# Patient Record
Sex: Female | Born: 1945 | Race: White | Hispanic: No | Marital: Married | Smoking: Former smoker
Health system: Southern US, Community
[De-identification: ages and names within clinical notes are randomized; demographics above are authoritative.]

## PROBLEM LIST (undated history)

## (undated) DIAGNOSIS — F329 Major depressive disorder, single episode, unspecified: Secondary | ICD-10-CM

## (undated) DIAGNOSIS — E7800 Pure hypercholesterolemia, unspecified: Secondary | ICD-10-CM

## (undated) DIAGNOSIS — M1990 Unspecified osteoarthritis, unspecified site: Secondary | ICD-10-CM

## (undated) DIAGNOSIS — I10 Essential (primary) hypertension: Secondary | ICD-10-CM

## (undated) DIAGNOSIS — I639 Cerebral infarction, unspecified: Secondary | ICD-10-CM

## (undated) DIAGNOSIS — H35041 Retinal micro-aneurysms, unspecified, right eye: Secondary | ICD-10-CM

## (undated) DIAGNOSIS — F32A Depression, unspecified: Secondary | ICD-10-CM

## (undated) DIAGNOSIS — D649 Anemia, unspecified: Secondary | ICD-10-CM

## (undated) HISTORY — PX: TUBAL LIGATION: SHX77

## (undated) HISTORY — PX: FINGER SURGERY: SHX640

## (undated) HISTORY — DX: Depression, unspecified: F32.A

## (undated) HISTORY — PX: EYE SURGERY: SHX253

## (undated) HISTORY — DX: Major depressive disorder, single episode, unspecified: F32.9

---

## 1999-01-09 ENCOUNTER — Encounter: Payer: Self-pay | Admitting: Ophthalmology

## 1999-01-09 ENCOUNTER — Ambulatory Visit (HOSPITAL_COMMUNITY): Admission: RE | Admit: 1999-01-09 | Discharge: 1999-01-10 | Payer: Self-pay | Admitting: Ophthalmology

## 2001-03-12 ENCOUNTER — Encounter: Payer: Self-pay | Admitting: Ophthalmology

## 2001-03-12 ENCOUNTER — Ambulatory Visit (HOSPITAL_COMMUNITY): Admission: RE | Admit: 2001-03-12 | Discharge: 2001-03-13 | Payer: Self-pay | Admitting: Ophthalmology

## 2001-05-27 ENCOUNTER — Other Ambulatory Visit: Admission: RE | Admit: 2001-05-27 | Discharge: 2001-05-27 | Payer: Self-pay | Admitting: Family Medicine

## 2004-04-16 ENCOUNTER — Other Ambulatory Visit: Admission: RE | Admit: 2004-04-16 | Discharge: 2004-04-16 | Payer: Self-pay | Admitting: Family Medicine

## 2008-07-22 ENCOUNTER — Encounter: Admission: RE | Admit: 2008-07-22 | Discharge: 2008-07-22 | Payer: Self-pay | Admitting: Orthopedic Surgery

## 2008-10-19 ENCOUNTER — Encounter: Admission: RE | Admit: 2008-10-19 | Discharge: 2008-12-07 | Payer: Self-pay | Admitting: Neurology

## 2011-02-22 NOTE — Op Note (Signed)
[place]  Patient:    Mckenzie [name], Mckenzie [name]                    MRN: [id number] Proc. Date: 03/12/01 Adm. Date:  [id number] Disc. Date: [id number] Attending:  Bertrum Sol                           Operative Report  DATE OF BIRTH:  [birth year]  ADMISSION DIAGNOSIS:  Macular hole, left eye.  OPERATION:  Pars plana vitrectomy, membrane peel, retinal photocoagulation, serum patch and perfluroropropane injection in the left eye.  SURGEON:  Beulah Gandy. Ashley Royalty, M.D.  ANESTHESIA:  General.  DESCRIPTION OF PROCEDURE:  Usual prep and drape.  Peritomies at 10, 2 and 4 oclock.  The 4 mm angled infusion port was anchored into place at 4 oclock. The lighted pick and the cutter were placed at 10 and 2 oclock, respectively.  The contact lens ring was anchored into place at 6 and 12 oclock.  The pars plana vitrectomy was begun just behind the pseudophakos.  Vitreous strands and membranes were encountered.  These were carefully removed under low suctioning and rapid cutting.  Once the core vitrectomy was complete, the vitrectomy was extended into the far periphery and the vitreous was removed down to the vitreous base.  The silicon-tipped suction line was positioned in the eye and extrusion was used.  The fish strike sign occurred.  Posterior membranes were engaged with the suction device and the lighted pick.  The pick was used to peel posterior membranes from the macular region.  Once this was accomplished, additional vitrectomy cutting was performed.  The instruments were removed from the eye and the indirect ophthalmoscope laser was moved into place.  711 burns were placed with a power between 400 and 460 milliwatts, duration 0.07 to 0.1 seconds and 1000 microns each.  A gas-fluid exchange was carried out removing all fluid from the posterior segment of the eye.  Sufficient time was allowed for additional fluid to track down the walls of the eye.   During this time, the serum patch and perfluoropropane mixture were prepared.  The additional fluid was removed with a [place] ophthalmic brush.  A serum patch was delivered and the intraocular gas was exchanged for perfluoropropane 17%.  The instruments were removed from the eye and 9-0 nylon was used to close the sclerotomy sites.  The closing tension was 10 with a Barraquer tonometer. The conjunctiva was closed with wet field cautery.  Polymyxin and gentamicin were irrigated into [place] space.  Atropine solution was applied.  Decadron 10 mg was injected into the lower subconjunctival space.  Marcaine was injected around the globe for postoperative pain.  Polysporin, a patch and shield were placed.  The patient was awakened and taken to recovery in satisfactory condition. DD:  03/12/01 TD:  03/13/01 Job: [id number]

## 2011-07-11 ENCOUNTER — Encounter (INDEPENDENT_AMBULATORY_CARE_PROVIDER_SITE_OTHER): Payer: Medicare Other | Admitting: Ophthalmology

## 2011-07-11 DIAGNOSIS — H4310 Vitreous hemorrhage, unspecified eye: Secondary | ICD-10-CM

## 2011-07-11 DIAGNOSIS — H35349 Macular cyst, hole, or pseudohole, unspecified eye: Secondary | ICD-10-CM

## 2011-07-24 ENCOUNTER — Encounter (INDEPENDENT_AMBULATORY_CARE_PROVIDER_SITE_OTHER): Payer: Medicare Other | Admitting: Ophthalmology

## 2011-07-24 DIAGNOSIS — H35349 Macular cyst, hole, or pseudohole, unspecified eye: Secondary | ICD-10-CM

## 2011-07-24 DIAGNOSIS — H3530 Unspecified macular degeneration: Secondary | ICD-10-CM

## 2011-07-24 DIAGNOSIS — H3509 Other intraretinal microvascular abnormalities: Secondary | ICD-10-CM

## 2011-08-07 ENCOUNTER — Encounter (INDEPENDENT_AMBULATORY_CARE_PROVIDER_SITE_OTHER): Payer: Self-pay | Admitting: Ophthalmology

## 2011-08-14 ENCOUNTER — Encounter (INDEPENDENT_AMBULATORY_CARE_PROVIDER_SITE_OTHER): Payer: Medicare Other | Admitting: Ophthalmology

## 2011-08-14 DIAGNOSIS — H35039 Hypertensive retinopathy, unspecified eye: Secondary | ICD-10-CM

## 2011-08-14 DIAGNOSIS — H348392 Tributary (branch) retinal vein occlusion, unspecified eye, stable: Secondary | ICD-10-CM

## 2011-08-14 DIAGNOSIS — H35349 Macular cyst, hole, or pseudohole, unspecified eye: Secondary | ICD-10-CM

## 2011-08-14 DIAGNOSIS — H4310 Vitreous hemorrhage, unspecified eye: Secondary | ICD-10-CM

## 2011-10-14 ENCOUNTER — Encounter (INDEPENDENT_AMBULATORY_CARE_PROVIDER_SITE_OTHER): Payer: Medicare Other | Admitting: Ophthalmology

## 2011-10-14 DIAGNOSIS — H35039 Hypertensive retinopathy, unspecified eye: Secondary | ICD-10-CM

## 2011-10-14 DIAGNOSIS — I10 Essential (primary) hypertension: Secondary | ICD-10-CM

## 2011-10-14 DIAGNOSIS — H4310 Vitreous hemorrhage, unspecified eye: Secondary | ICD-10-CM

## 2011-10-14 DIAGNOSIS — H348392 Tributary (branch) retinal vein occlusion, unspecified eye, stable: Secondary | ICD-10-CM

## 2011-10-19 ENCOUNTER — Emergency Department (INDEPENDENT_AMBULATORY_CARE_PROVIDER_SITE_OTHER)
Admission: EM | Admit: 2011-10-19 | Discharge: 2011-10-19 | Disposition: A | Discharge status: Home / Self Care | Payer: Medicare Other | Source: Home / Self Care | Attending: Family Medicine | Admitting: Family Medicine

## 2011-10-19 ENCOUNTER — Encounter (HOSPITAL_COMMUNITY): Payer: Self-pay | Admitting: *Deleted

## 2011-10-19 DIAGNOSIS — J111 Influenza due to unidentified influenza virus with other respiratory manifestations: Secondary | ICD-10-CM

## 2011-10-19 DIAGNOSIS — R6889 Other general symptoms and signs: Secondary | ICD-10-CM

## 2011-10-19 HISTORY — DX: Pure hypercholesterolemia, unspecified: E78.00

## 2011-10-19 HISTORY — DX: Essential (primary) hypertension: I10

## 2011-10-19 HISTORY — DX: Retinal micro-aneurysms, unspecified, right eye: H35.041

## 2011-10-19 MED ORDER — IPRATROPIUM BROMIDE 0.06 % NA SOLN: 2.0000 | Freq: Four times a day (QID) | NASAL | Status: DC

## 2011-10-19 MED ORDER — GUAIFENESIN-CODEINE 100-10 MG/5ML PO SYRP: 5.0000 mL | ORAL_SOLUTION | Freq: Three times a day (TID) | ORAL | Status: AC | PRN

## 2011-10-19 MED ORDER — AZITHROMYCIN 250 MG PO TABS: ORAL_TABLET | ORAL | Status: AC

## 2011-10-19 NOTE — ED Notes (Signed)
Pt with c/o cough /congestion/headache/mild bilateral ear pain /facial pressure/ fever onset yesterday

## 2011-10-19 NOTE — ED Provider Notes (Signed)
History     CSN: [id number]  Arrival date & time 10/19/11  4098   First MD Initiated Contact with Patient 10/19/11 [phone]      Chief Complaint  Patient presents with  . Cough  . Nasal Congestion  . Fever  . Headache    (Consider location/radiation/quality/duration/timing/severity/associated sxs/prior treatment) Patient is a 66 y.o. female presenting with cough, fever, and headaches. The history is provided by the patient.  Cough This is a new problem. The current episode started yesterday. The problem occurs constantly. The problem has not changed since onset.The cough is non-productive. The maximum temperature recorded prior to her arrival was 101 to 101.9 F. Associated symptoms include headaches and myalgias. Pertinent negatives include no chills and no rhinorrhea. She is not a smoker.  Fever Primary symptoms of the febrile illness include fever, headaches, cough and myalgias.  Headache The primary symptoms include headaches and fever.    Past Medical History  Diagnosis Date  . Hypertension   . High cholesterol   . Retinal micro-aneurysm of right eye     Past Surgical History  Procedure Date  . Eye surgery     History reviewed. No pertinent family history.  History  Substance Use Topics  . Smoking status: Never Smoker   . Smokeless tobacco: Not on file  . Alcohol Use: No    OB History    Grav Para Term Preterm Abortions TAB SAB Ect Mult Living                  Review of Systems  Constitutional: Positive for fever. Negative for chills.  HENT: Positive for congestion and postnasal drip. Negative for rhinorrhea.   Respiratory: Positive for cough.   Cardiovascular: Negative.   Gastrointestinal: Negative.   Genitourinary: Negative.   Musculoskeletal: Positive for myalgias.  Neurological: Positive for headaches.    Allergies  Review of patient's allergies indicates no known allergies.  Home Medications   Current Outpatient Rx  Name Route Sig Dispense  Refill  . ATORVASTATIN CALCIUM 40 MG PO TABS Oral Take 40 mg by mouth daily.    Marland Kitchen BRIMONIDINE TARTRATE 0.1 % OP SOLN  1 drop. One drop right eye twice a day    . LISINOPRIL 20 MG PO TABS Oral Take 20 mg by mouth daily.    Marland Kitchen LOTEPREDNOL ETABONATE 0.5 % OP SUSP Right Eye Place 1 drop into the right eye 4 (four) times daily.    Marland Kitchen PAROXETINE HCL 20 MG PO TABS Oral Take 20 mg by mouth every morning.    . AZITHROMYCIN 250 MG PO TABS  Take as directed on pack 6 each 0  . GUAIFENESIN-CODEINE 100-10 MG/5ML PO SYRP Oral Take 5 mLs by mouth 3 (three) times daily as needed for cough. 120 mL 0  . IPRATROPIUM BROMIDE 0.06 % NA SOLN Nasal Place 2 sprays into the nose 4 (four) times daily. 15 mL 12    BP 140/77  Pulse 96  Temp(Src) 101.3 F (38.5 C) (Oral)  Resp 19  SpO2 95%  Physical Exam  Nursing note and vitals reviewed. Constitutional: She is oriented to person, place, and time. She appears well-developed and well-nourished.  HENT:  Head: Normocephalic.  Right Ear: External ear normal.  Left Ear: External ear normal.  Nose: Nose normal.  Mouth/Throat: Oropharynx is clear and moist.  Eyes: Conjunctivae and EOM are normal. Pupils are equal, round, and reactive to light.  Neck: Normal range of motion. Neck supple.  Cardiovascular: Normal rate, regular  rhythm, normal heart sounds and intact distal pulses.   Pulmonary/Chest: Effort normal and breath sounds normal. She has no wheezes. She has no rales.  Abdominal: Soft. Bowel sounds are normal.  Lymphadenopathy:    She has no cervical adenopathy.  Neurological: She is alert and oriented to person, place, and time.  Skin: Skin is warm and dry.  Psychiatric: She has a normal mood and affect.    ED Course  Procedures (including critical care time)  Labs Reviewed - No data to display No results found.   1. Influenza-like illness       MDM          Barkley Bruns, MD 10/19/11 1016

## 2011-10-31 ENCOUNTER — Encounter (INDEPENDENT_AMBULATORY_CARE_PROVIDER_SITE_OTHER): Payer: Medicare Other | Admitting: Ophthalmology

## 2011-10-31 DIAGNOSIS — I10 Essential (primary) hypertension: Secondary | ICD-10-CM

## 2011-10-31 DIAGNOSIS — H35039 Hypertensive retinopathy, unspecified eye: Secondary | ICD-10-CM

## 2011-10-31 DIAGNOSIS — H348392 Tributary (branch) retinal vein occlusion, unspecified eye, stable: Secondary | ICD-10-CM

## 2012-01-11 ENCOUNTER — Encounter (HOSPITAL_COMMUNITY): Payer: Self-pay | Admitting: Nurse Practitioner

## 2012-01-11 ENCOUNTER — Emergency Department (HOSPITAL_COMMUNITY): Payer: Medicare Other

## 2012-01-11 ENCOUNTER — Emergency Department (HOSPITAL_COMMUNITY)
Admission: EM | Admit: 2012-01-11 | Discharge: 2012-01-11 | Disposition: A | Discharge status: Home Health | Payer: Medicare Other | Attending: Emergency Medicine | Admitting: Emergency Medicine

## 2012-01-11 DIAGNOSIS — M25473 Effusion, unspecified ankle: Secondary | ICD-10-CM | POA: Insufficient documentation

## 2012-01-11 DIAGNOSIS — X500XXA Overexertion from strenuous movement or load, initial encounter: Secondary | ICD-10-CM | POA: Insufficient documentation

## 2012-01-11 DIAGNOSIS — S9000XA Contusion of unspecified ankle, initial encounter: Secondary | ICD-10-CM | POA: Insufficient documentation

## 2012-01-11 DIAGNOSIS — E7800 Pure hypercholesterolemia, unspecified: Secondary | ICD-10-CM | POA: Insufficient documentation

## 2012-01-11 DIAGNOSIS — S9030XA Contusion of unspecified foot, initial encounter: Secondary | ICD-10-CM | POA: Insufficient documentation

## 2012-01-11 DIAGNOSIS — M25579 Pain in unspecified ankle and joints of unspecified foot: Secondary | ICD-10-CM | POA: Insufficient documentation

## 2012-01-11 DIAGNOSIS — I10 Essential (primary) hypertension: Secondary | ICD-10-CM | POA: Insufficient documentation

## 2012-01-11 DIAGNOSIS — M25476 Effusion, unspecified foot: Secondary | ICD-10-CM | POA: Insufficient documentation

## 2012-01-11 DIAGNOSIS — M7989 Other specified soft tissue disorders: Secondary | ICD-10-CM | POA: Insufficient documentation

## 2012-01-11 DIAGNOSIS — Z79899 Other long term (current) drug therapy: Secondary | ICD-10-CM | POA: Insufficient documentation

## 2012-01-11 DIAGNOSIS — Y92009 Unspecified place in unspecified non-institutional (private) residence as the place of occurrence of the external cause: Secondary | ICD-10-CM | POA: Insufficient documentation

## 2012-01-11 DIAGNOSIS — M79609 Pain in unspecified limb: Secondary | ICD-10-CM | POA: Insufficient documentation

## 2012-01-11 DIAGNOSIS — S93409A Sprain of unspecified ligament of unspecified ankle, initial encounter: Secondary | ICD-10-CM

## 2012-01-11 MED ORDER — HYDROCODONE-ACETAMINOPHEN 5-325 MG PO TABS: 1.0000 | ORAL_TABLET | ORAL | Status: AC | PRN

## 2012-01-11 MED ORDER — HYDROCODONE-ACETAMINOPHEN 5-325 MG PO TABS
1.0000 | ORAL_TABLET | Freq: Once | ORAL | Status: AC
  Administered 2012-01-11: 1 via ORAL
  Filled 2012-01-11: qty 1

## 2012-01-11 NOTE — ED Notes (Signed)
Ortho at bedside to apply splint and complete crutch teaching

## 2012-01-11 NOTE — ED Notes (Signed)
Pt getting up from chair today and twisted R ankle. Pain and swelling since. Cms intact

## 2012-01-11 NOTE — ED Provider Notes (Signed)
History     CSN: [id number]  Arrival date & time 01/11/12  1157   First MD Initiated Contact with Patient 01/11/12 1320      Chief Complaint  Patient presents with  . Ankle Pain    (Consider location/radiation/quality/duration/timing/severity/associated sxs/prior treatment) HPI Comments: Patient was at home today at approximately 10:30 she got up from a chair in her foot twisted abnormally.  She now has significant swelling and bruising present over her right foot and ankle.  She has no focal areas of pain but has diffuse pain across her foot and ankle.  She's been unable to bear weight on it.  Patient denies pain elsewhere.  She did not get dizzy, lightheaded or have a syncopal episode.  She has seen an orthopedic specialist in the past.  No numbness in the foot.  Patient is a 66 y.o. female presenting with ankle pain. The history is provided by the patient. No language interpreter was used.  Ankle Pain  The incident occurred 3 to 5 hours ago. The incident occurred at home.    Past Medical History  Diagnosis Date  . Hypertension   . High cholesterol   . Retinal micro-aneurysm of right eye     Past Surgical History  Procedure Date  . Eye surgery     History reviewed. No pertinent family history.  History  Substance Use Topics  . Smoking status: Never Smoker   . Smokeless tobacco: Not on file  . Alcohol Use: No    OB History    Grav Para Term Preterm Abortions TAB SAB Ect Mult Living                  Review of Systems  Constitutional: Negative.  Negative for fever and chills.  HENT: Negative.   Eyes: Negative.  Negative for discharge and redness.  Respiratory: Negative.  Negative for cough and shortness of breath.   Cardiovascular: Negative.  Negative for chest pain.  Gastrointestinal: Negative.  Negative for nausea, vomiting, abdominal pain and diarrhea.  Genitourinary: Negative.  Negative for dysuria and vaginal discharge.  Musculoskeletal: Positive for joint  swelling. Negative for back pain.  Skin: Negative.  Negative for color change and rash.  Neurological: Negative.  Negative for syncope and headaches.  Hematological: Negative.  Negative for adenopathy.  Psychiatric/Behavioral: Negative.  Negative for confusion.  All other systems reviewed and are negative.    Allergies  Aldomet  Home Medications   Current Outpatient Rx  Name Route Sig Dispense Refill  . ATORVASTATIN CALCIUM 40 MG PO TABS Oral Take 40 mg by mouth daily.    Marland Kitchen BRIMONIDINE TARTRATE 0.1 % OP SOLN Right Eye Place 1 drop into the right eye 2 (two) times daily.     Marland Kitchen CINNAMON PO Oral Take 1 capsule by mouth daily.    Marland Kitchen VITAMIN B-12 PO Oral Take 1 tablet by mouth daily.    Marland Kitchen FLAXSEED OIL PO Oral Take 1 capsule by mouth daily.    Marland Kitchen LISINOPRIL 20 MG PO TABS Oral Take 20 mg by mouth daily.    Marland Kitchen LOTEPREDNOL ETABONATE 0.5 % OP SUSP Right Eye Place 1 drop into the right eye 4 (four) times daily.    Marland Kitchen FISH OIL PO Oral Take 1 capsule by mouth daily.    Marland Kitchen PAROXETINE HCL 20 MG PO TABS Oral Take 20 mg by mouth every morning.      BP 153/72  Pulse 73  Temp(Src) 98.4 F (36.9 C) (Oral)  Resp  14  Ht 5\' 4"  (1.626 m)  Wt 132 lb (59.875 kg)  BMI 22.66 kg/m2  SpO2 99%  Physical Exam  Nursing note and vitals reviewed. Constitutional: She is oriented to person, place, and time. She appears well-developed and well-nourished.  Non-toxic appearance. She does not have a sickly appearance.  HENT:  Head: Normocephalic and atraumatic.  Eyes: Conjunctivae, EOM and lids are normal. Pupils are equal, round, and reactive to light. No scleral icterus.  Neck: Trachea normal and normal range of motion. Neck supple.  Cardiovascular: Normal rate.   Pulmonary/Chest: Effort normal.  Abdominal: Soft. Normal appearance. There is no CVA tenderness.  Musculoskeletal: Normal range of motion.       Patient has significant swelling over her right ankle and right foot.  There is bruising noted on the more  lateral aspect of her foot and ankle.  Capillary refill is less than 2 seconds.  Patient has sensation to light touch on the medial and lateral aspects of her foot.  No tenderness to palpation more proximally on her leg.  Neurological: She is alert and oriented to person, place, and time. She has normal strength.  Skin: Skin is warm, dry and intact. No rash noted.  Psychiatric: She has a normal mood and affect. Her behavior is normal. Judgment and thought content normal.    ED Course  Procedures (including critical care time)  Labs Reviewed - No data to display Dg Ankle 2 Views Right  01/11/2012  *RADIOLOGY REPORT*  Clinical Data: Twisted ankle  RIGHT ANKLE - 2 VIEW  Comparison: None.  Findings: Two views of the right ankle submitted. Diffuse osteopenia.  No displaced fracture or subluxation.  Significant soft tissue swelling noted adjacent to lateral malleolus.  Ankle mortise is preserved.  Plantar and posterior spur of the calcaneus. There is a small bony fragment adjacent to posterior superior aspect of the navicular.  Small avulsion fracture cannot be excluded.  Clinical correlation is necessary.  IMPRESSION: No displaced fracture or subluxation.  Significant soft tissue swelling noted adjacent to lateral malleolus.  Ankle mortise is preserved.  Plantar and posterior spur of the calcaneus.  There is a small bony fragment adjacent to posterior superior aspect of the navicular.  Small avulsion fracture cannot be excluded.  Clinical correlation is necessary.  Original Report Authenticated By: Natasha Mead, M.D.     No diagnosis found.    MDM  Patient with no obvious fracture on her x-ray.  She is significantly swollen and bruised across her foot and ankle swelling going to place her in a stirrup splint and make her nonweightbearing with crutches.  Patient has been given instructions to followup with her orthopedist later this week.  She has seen Dr. Darrelyn Hillock in the past.  Maryclare Labrador place her on pain  medicine as well.  She shows no signs of neurovascular deficits at this time.        Nat Christen, MD 01/11/12 1331

## 2012-01-11 NOTE — Progress Notes (Signed)
Orthopedic Tech Progress Note Patient Details:  Mckenzie [name] 1946-05-27 [id number]  Type of Splint: Short Leg Splint Interventions: Application    Cammer, Mickie Bail 01/11/2012, 2:12 PM

## 2012-01-11 NOTE — Discharge Instructions (Signed)
Ankle Sprain An ankle sprain is an injury to the strong, fibrous tissues (ligaments) that hold the bones of your ankle joint together.  CAUSES Ankle sprain usually is caused by a fall or by twisting your ankle. People who participate in sports are more prone to these types of injuries.  SYMPTOMS  Symptoms of ankle sprain include:  Pain in your ankle. The pain may be present at rest or only when you are trying to stand or walk.   Swelling.   Bruising. Bruising may develop immediately or within 1 to 2 days after your injury.   Difficulty standing or walking.  DIAGNOSIS  Your caregiver will ask you details about your injury and perform a physical exam of your ankle to determine if you have an ankle sprain. During the physical exam, your caregiver will press and squeeze specific areas of your foot and ankle. Your caregiver will try to move your ankle in certain ways. An X-ray exam may be done to be sure a bone was not broken or a ligament did not separate from one of the bones in your ankle (avulsion).  TREATMENT  Certain types of braces can help stabilize your ankle. Your caregiver can make a recommendation for this. Your caregiver may recommend the use of medication for pain. If your sprain is severe, your caregiver may refer you to a surgeon who helps to restore function to parts of your skeletal system (orthopedist) or a physical therapist. HOME CARE INSTRUCTIONS  Apply ice to your injury for 1 to 2 days or as directed by your caregiver. Applying ice helps to reduce inflammation and pain.  Put ice in a plastic bag.   Place a towel between your skin and the bag.   Leave the ice on for 15 to 20 minutes at a time, every 2 hours while you are awake.   Take over-the-counter or prescription medicines for pain, discomfort, or fever only as directed by your caregiver.   Keep your injured leg elevated, when possible, to lessen swelling.   If your caregiver recommends crutches, use them as  instructed. Gradually, put weight on the affected ankle. Continue to use crutches or a cane until you can walk without feeling pain in your ankle.   If you have a plaster splint, wear the splint as directed by your caregiver. Do not rest it on anything harder than a pillow the first 24 hours. Do not put weight on it. Do not get it wet. You may take it off to take a shower or bath.   You may have been given an elastic bandage to wear around your ankle to provide support. If the elastic bandage is too tight (you have numbness or tingling in your foot or your foot becomes cold and blue), adjust the bandage to make it comfortable.   If you have an air splint, you may blow more air into it or let air out to make it more comfortable. You may take your splint off at night and before taking a shower or bath.   Wiggle your toes in the splint several times per day if you are able.  SEEK MEDICAL CARE IF:   You have an increase in bruising, swelling, or pain.   Your toes feel cold.   Pain relief is not achieved with medication.  SEEK IMMEDIATE MEDICAL CARE IF: Your toes are numb or blue or you have severe pain. MAKE SURE YOU:   Understand these instructions.   Will watch your condition.     Will get help right away if you are not doing well or get worse.  Document Released: 09/23/2005 Document Revised: 09/12/2011 Document Reviewed: 04/27/2008 [facility] Patient Information 2012 [place], [place].  Cast or Splint Care Casts and splints support injured limbs and keep bones from moving while they heal. It is important to care for your cast or splint at home.  HOME CARE INSTRUCTIONS  Keep the cast or splint uncovered during the drying period. It can take 24 to 48 hours to dry if it is made of plaster. A fiberglass cast will dry in less than 1 hour.   Do not rest the cast on anything harder than a pillow for the first 24 hours.   Do not put weight on your injured limb or apply pressure to the cast  until your caregiver gives you permission.   Keep the cast or splint dry. Wet casts or splints can lose their shape and may not support the limb as well. Also, wet skin can become infected. Cover the cast or splint with a plastic bag when bathing or when out in the rain or snow. If the cast is on the trunk of the body, take sponge baths until the cast is removed.   Keep your cast or splint clean. Soiled casts may be wiped with a moistened cloth.   Do not place any foreign objects under your cast or splint. Do not try to scratch the skin under the cast with any object. The object could get stuck inside the cast. Also, scratching could lead to an infection.   Do not remove padding from inside your cast.   Exercise all joints next to the injury that are not immobilized by the cast or splint. For example, if you have a long leg cast, exercise the hip joint and toes. If you have an arm cast or splint, exercise the shoulder, elbow, thumb, and fingers.   Elevate your injured arm or leg on 1 or 2 pillows for the first 1 to 3 days to decrease swelling and pain.It is best if you can comfortably elevate your cast so it is higher than your heart.  SEEK MEDICAL CARE IF:   Your cast or splint cracks.   Your cast or splint is too tight or too loose.   You experience unbearable itching inside the cast.   Your cast becomes wet or develops a soft spot or area.   You have a bad smell coming from inside your cast.   You get an object stuck under your cast.   Your skin around the cast becomes red or raw.   You develop a new pain or worsening pain after the cast has been applied.  SEEK IMMEDIATE MEDICAL CARE IF:   You have fluid leaking through the cast.   You are unable to move your fingers or toes.   You have discolored, cool, painful, or very swollen fingers or toes beyond the cast.   You have tingling or numbness around the injured area.   You have severe pain or pressure under the cast.    You develop any difficulty with your breathing or have shortness of breath.   You develop chest pain.  Document Released: 09/20/2000 Document Revised: 09/12/2011 Document Reviewed: 12/19/2010 [facility] Patient Information 2012 [place], [place].

## 2012-01-11 NOTE — ED Notes (Signed)
otho paged for crutches and splint

## 2012-01-11 NOTE — Progress Notes (Signed)
Orthopedic Tech Progress Note Patient Details:  Mckenzie [name] 30-Sep-1946 [id number]  Other Ortho Devices Type of Ortho Device: Crutches Ortho Device Interventions: Adjustment   Cammer, Mickie Bail 01/11/2012, 2:12 PM

## 2012-01-29 ENCOUNTER — Encounter (INDEPENDENT_AMBULATORY_CARE_PROVIDER_SITE_OTHER): Payer: Medicare Other | Admitting: Ophthalmology

## 2012-01-29 DIAGNOSIS — H35039 Hypertensive retinopathy, unspecified eye: Secondary | ICD-10-CM

## 2012-01-29 DIAGNOSIS — H348392 Tributary (branch) retinal vein occlusion, unspecified eye, stable: Secondary | ICD-10-CM

## 2012-01-29 DIAGNOSIS — I10 Essential (primary) hypertension: Secondary | ICD-10-CM

## 2012-01-29 DIAGNOSIS — H43819 Vitreous degeneration, unspecified eye: Secondary | ICD-10-CM

## 2012-03-11 ENCOUNTER — Encounter (INDEPENDENT_AMBULATORY_CARE_PROVIDER_SITE_OTHER): Payer: Medicare Other | Admitting: Ophthalmology

## 2012-03-11 DIAGNOSIS — H35349 Macular cyst, hole, or pseudohole, unspecified eye: Secondary | ICD-10-CM

## 2012-03-11 DIAGNOSIS — H348392 Tributary (branch) retinal vein occlusion, unspecified eye, stable: Secondary | ICD-10-CM

## 2012-03-11 DIAGNOSIS — H35039 Hypertensive retinopathy, unspecified eye: Secondary | ICD-10-CM

## 2012-03-11 DIAGNOSIS — I10 Essential (primary) hypertension: Secondary | ICD-10-CM

## 2012-07-13 ENCOUNTER — Ambulatory Visit (INDEPENDENT_AMBULATORY_CARE_PROVIDER_SITE_OTHER): Payer: Medicare Other | Admitting: Ophthalmology

## 2012-07-13 DIAGNOSIS — H35039 Hypertensive retinopathy, unspecified eye: Secondary | ICD-10-CM

## 2012-07-13 DIAGNOSIS — H348392 Tributary (branch) retinal vein occlusion, unspecified eye, stable: Secondary | ICD-10-CM

## 2012-07-13 DIAGNOSIS — I10 Essential (primary) hypertension: Secondary | ICD-10-CM

## 2012-07-13 DIAGNOSIS — H43819 Vitreous degeneration, unspecified eye: Secondary | ICD-10-CM

## 2012-07-17 ENCOUNTER — Ambulatory Visit (INDEPENDENT_AMBULATORY_CARE_PROVIDER_SITE_OTHER): Payer: Medicare Other | Admitting: Ophthalmology

## 2012-07-17 DIAGNOSIS — H348392 Tributary (branch) retinal vein occlusion, unspecified eye, stable: Secondary | ICD-10-CM

## 2012-07-17 DIAGNOSIS — H3530 Unspecified macular degeneration: Secondary | ICD-10-CM

## 2012-07-17 DIAGNOSIS — I10 Essential (primary) hypertension: Secondary | ICD-10-CM

## 2012-07-17 DIAGNOSIS — H35039 Hypertensive retinopathy, unspecified eye: Secondary | ICD-10-CM

## 2012-08-10 ENCOUNTER — Encounter (INDEPENDENT_AMBULATORY_CARE_PROVIDER_SITE_OTHER): Payer: Medicare Other | Admitting: Ophthalmology

## 2012-08-10 DIAGNOSIS — H35039 Hypertensive retinopathy, unspecified eye: Secondary | ICD-10-CM

## 2012-08-10 DIAGNOSIS — H348392 Tributary (branch) retinal vein occlusion, unspecified eye, stable: Secondary | ICD-10-CM

## 2012-08-10 DIAGNOSIS — I10 Essential (primary) hypertension: Secondary | ICD-10-CM

## 2012-09-10 ENCOUNTER — Encounter (INDEPENDENT_AMBULATORY_CARE_PROVIDER_SITE_OTHER): Payer: Medicare Other | Admitting: Ophthalmology

## 2012-09-10 DIAGNOSIS — H35349 Macular cyst, hole, or pseudohole, unspecified eye: Secondary | ICD-10-CM

## 2012-09-10 DIAGNOSIS — H348392 Tributary (branch) retinal vein occlusion, unspecified eye, stable: Secondary | ICD-10-CM

## 2012-09-10 DIAGNOSIS — H35039 Hypertensive retinopathy, unspecified eye: Secondary | ICD-10-CM

## 2012-09-10 DIAGNOSIS — I10 Essential (primary) hypertension: Secondary | ICD-10-CM

## 2012-10-09 ENCOUNTER — Encounter (INDEPENDENT_AMBULATORY_CARE_PROVIDER_SITE_OTHER): Payer: Medicare Other | Admitting: Ophthalmology

## 2012-10-09 DIAGNOSIS — H348392 Tributary (branch) retinal vein occlusion, unspecified eye, stable: Secondary | ICD-10-CM

## 2012-10-09 DIAGNOSIS — I10 Essential (primary) hypertension: Secondary | ICD-10-CM

## 2012-10-09 DIAGNOSIS — H35039 Hypertensive retinopathy, unspecified eye: Secondary | ICD-10-CM

## 2012-11-06 ENCOUNTER — Encounter (INDEPENDENT_AMBULATORY_CARE_PROVIDER_SITE_OTHER): Payer: Medicare Other | Admitting: Ophthalmology

## 2012-11-06 DIAGNOSIS — H35039 Hypertensive retinopathy, unspecified eye: Secondary | ICD-10-CM

## 2012-11-06 DIAGNOSIS — H348392 Tributary (branch) retinal vein occlusion, unspecified eye, stable: Secondary | ICD-10-CM

## 2012-11-06 DIAGNOSIS — I10 Essential (primary) hypertension: Secondary | ICD-10-CM

## 2012-12-03 ENCOUNTER — Encounter (INDEPENDENT_AMBULATORY_CARE_PROVIDER_SITE_OTHER): Payer: Medicare Other | Admitting: Ophthalmology

## 2012-12-03 DIAGNOSIS — H348392 Tributary (branch) retinal vein occlusion, unspecified eye, stable: Secondary | ICD-10-CM

## 2012-12-03 DIAGNOSIS — H35039 Hypertensive retinopathy, unspecified eye: Secondary | ICD-10-CM

## 2012-12-03 DIAGNOSIS — I10 Essential (primary) hypertension: Secondary | ICD-10-CM

## 2012-12-31 ENCOUNTER — Encounter (INDEPENDENT_AMBULATORY_CARE_PROVIDER_SITE_OTHER): Payer: Medicare Other | Admitting: Ophthalmology

## 2012-12-31 DIAGNOSIS — H35349 Macular cyst, hole, or pseudohole, unspecified eye: Secondary | ICD-10-CM

## 2012-12-31 DIAGNOSIS — I10 Essential (primary) hypertension: Secondary | ICD-10-CM

## 2012-12-31 DIAGNOSIS — H35039 Hypertensive retinopathy, unspecified eye: Secondary | ICD-10-CM

## 2012-12-31 DIAGNOSIS — H348392 Tributary (branch) retinal vein occlusion, unspecified eye, stable: Secondary | ICD-10-CM

## 2013-01-25 ENCOUNTER — Encounter (INDEPENDENT_AMBULATORY_CARE_PROVIDER_SITE_OTHER): Payer: Medicare Other | Admitting: Ophthalmology

## 2013-01-25 DIAGNOSIS — I10 Essential (primary) hypertension: Secondary | ICD-10-CM

## 2013-01-25 DIAGNOSIS — H348392 Tributary (branch) retinal vein occlusion, unspecified eye, stable: Secondary | ICD-10-CM

## 2013-01-25 DIAGNOSIS — H35039 Hypertensive retinopathy, unspecified eye: Secondary | ICD-10-CM

## 2013-01-25 DIAGNOSIS — H35349 Macular cyst, hole, or pseudohole, unspecified eye: Secondary | ICD-10-CM

## 2013-02-22 ENCOUNTER — Encounter (INDEPENDENT_AMBULATORY_CARE_PROVIDER_SITE_OTHER): Payer: Medicare Other | Admitting: Ophthalmology

## 2013-02-22 DIAGNOSIS — H35039 Hypertensive retinopathy, unspecified eye: Secondary | ICD-10-CM

## 2013-02-22 DIAGNOSIS — I10 Essential (primary) hypertension: Secondary | ICD-10-CM

## 2013-02-22 DIAGNOSIS — H348392 Tributary (branch) retinal vein occlusion, unspecified eye, stable: Secondary | ICD-10-CM

## 2013-02-22 DIAGNOSIS — H35379 Puckering of macula, unspecified eye: Secondary | ICD-10-CM

## 2013-02-22 DIAGNOSIS — H43819 Vitreous degeneration, unspecified eye: Secondary | ICD-10-CM

## 2013-03-09 ENCOUNTER — Ambulatory Visit (INDEPENDENT_AMBULATORY_CARE_PROVIDER_SITE_OTHER): Payer: Medicare Other | Admitting: General Practice

## 2013-03-09 ENCOUNTER — Encounter: Payer: Self-pay | Admitting: General Practice

## 2013-03-09 VITALS — BP 137/72 | HR 73 | Temp 98.4°F | Ht 66.0 in | Wt 144.0 lb

## 2013-03-09 DIAGNOSIS — E785 Hyperlipidemia, unspecified: Secondary | ICD-10-CM

## 2013-03-09 DIAGNOSIS — Z09 Encounter for follow-up examination after completed treatment for conditions other than malignant neoplasm: Secondary | ICD-10-CM

## 2013-03-09 DIAGNOSIS — I10 Essential (primary) hypertension: Secondary | ICD-10-CM

## 2013-03-09 LAB — POCT CBC
Hemoglobin: 13.9 g/dL (ref 12.2–16.2)
Lymph, poc: 2 (ref 0.6–3.4)
MCH, POC: 28.9 pg (ref 27–31.2)
MCHC: 33.6 g/dL (ref 31.8–35.4)
MCV: 86 fL (ref 80–97)
MPV: 8 fL (ref 0–99.8)
POC LYMPH PERCENT: 32.2 %L (ref 10–50)

## 2013-03-09 NOTE — Progress Notes (Signed)
  Subjective:    [name], female    DOB: [birth year], 67 y.o.   MRN: [id number]  HPI Presents today for 3 month follow up on chronic medical conditions (hypertension and hyperlipidemia). Reports taking medications as prescribed.     Review of Systems  Constitutional: Negative.  Negative for fever and chills.  Respiratory: Negative for choking and shortness of breath.   Cardiovascular: Negative for chest pain, palpitations and leg swelling.  Gastrointestinal: Negative.   Genitourinary: Negative.   Musculoskeletal: Negative.   Skin: Negative.   Neurological: Negative for dizziness, weakness, numbness and headaches.  Psychiatric/Behavioral: Negative.   All other systems reviewed and are negative.       Objective:   Physical Exam  Constitutional: She is oriented to person, place, and time. She appears well-developed and well-nourished.  HENT:  Head: Normocephalic and atraumatic.  Right Ear: External ear normal.  Left Ear: External ear normal.  Eyes: EOM are normal.  Neck: Normal range of motion.  Cardiovascular: Normal rate, regular rhythm and normal heart sounds.   Pulmonary/Chest: Effort normal and breath sounds normal. No respiratory distress.  Abdominal: Soft. Bowel sounds are normal. She exhibits no distension. There is no tenderness.  Neurological: She is alert and oriented to person, place, and time.  Skin: Skin is warm and dry.  Psychiatric: She has a normal mood and affect.          Assessment & Plan:  1. Other and unspecified hyperlipidemia - POCT CBC - NMR Lipoprofile with Lipids - COMPLETE METABOLIC PANEL WITH GFR - atorvastatin (LIPITOR) 40 MG tablet; Take 1 tablet (40 mg total) by mouth daily.  Dispense: 30 tablet; Refill: 3  2. Essential hypertension, benign - lisinopril (PRINIVIL,ZESTRIL) 20 MG tablet; Take 1 tablet (20 mg total) by mouth daily.  Dispense: 30 tablet; Refill: 3 -Continue to work on healthy eating habits  -discussed  importance of regular exercise -Patient verbalized understanding Coralie Keens, FNP-C

## 2013-03-10 LAB — COMPLETE METABOLIC PANEL WITH GFR
Albumin: 4.2 g/dL (ref 3.5–5.2)
Alkaline Phosphatase: 83 U/L (ref 39–117)
BUN: 10 mg/dL (ref 6–23)
CO2: 30 mEq/L (ref 19–32)
Calcium: 10 mg/dL (ref 8.4–10.5)
Chloride: 104 mEq/L (ref 96–112)
GFR, Est African American: >89 mL/min
GFR, Est Non African American: >89 mL/min
Glucose, Bld: 90 mg/dL (ref 70–99)
Potassium: 4.7 mEq/L (ref 3.5–5.3)
Total Protein: 6.9 g/dL (ref 6.0–8.3)

## 2013-03-10 LAB — NMR LIPOPROFILE WITH LIPIDS
HDL Particle Number: 33.2 umol/L (ref >=30.5)
HDL Size: 8.6 nm — ABNORMAL LOW (ref >=9.2)
Large HDL-P: 3.3 umol/L — ABNORMAL LOW (ref >=4.8)
Large VLDL-P: 1.3 nmol/L (ref <=2.7)
Triglycerides: 208 mg/dL — ABNORMAL HIGH (ref <150)

## 2013-03-10 MED ORDER — ATORVASTATIN CALCIUM 40 MG PO TABS: 40.0000 mg | ORAL_TABLET | Freq: Every day | ORAL | Status: DC

## 2013-03-10 MED ORDER — LISINOPRIL 20 MG PO TABS: 20.0000 mg | ORAL_TABLET | Freq: Every day | ORAL | Status: DC

## 2013-03-15 ENCOUNTER — Telehealth: Payer: Self-pay | Admitting: General Practice

## 2013-03-15 NOTE — Telephone Encounter (Signed)
Spoke with husband

## 2013-03-16 NOTE — Telephone Encounter (Signed)
lmtcb on result page

## 2013-03-19 ENCOUNTER — Telehealth: Payer: Self-pay | Admitting: General Practice

## 2013-03-19 NOTE — Telephone Encounter (Signed)
Spoke with patient.

## 2013-03-22 ENCOUNTER — Encounter (INDEPENDENT_AMBULATORY_CARE_PROVIDER_SITE_OTHER): Payer: Medicare Other | Admitting: Ophthalmology

## 2013-03-22 DIAGNOSIS — H348392 Tributary (branch) retinal vein occlusion, unspecified eye, stable: Secondary | ICD-10-CM

## 2013-03-22 DIAGNOSIS — H35039 Hypertensive retinopathy, unspecified eye: Secondary | ICD-10-CM

## 2013-03-22 DIAGNOSIS — I10 Essential (primary) hypertension: Secondary | ICD-10-CM

## 2013-03-23 ENCOUNTER — Telehealth: Payer: Self-pay | Admitting: General Practice

## 2013-03-23 NOTE — Telephone Encounter (Signed)
Spoke with patient about elevated LDL and explained risk. She declined a change or increase in medication and verbalized she would work on her eating habits and exercise. Informed patient her levels would be checked with her next 3 month visit and treatment discussed again. Patient verbalized understanding and in agreement.

## 2013-03-23 NOTE — Telephone Encounter (Signed)
Taking lipitor 40 mg and doesn't want to increase dose.  She took crestor before and it made her hurt all over. A bit anxious about adding another med but would probably try.

## 2013-04-16 ENCOUNTER — Encounter (INDEPENDENT_AMBULATORY_CARE_PROVIDER_SITE_OTHER): Payer: Medicare Other | Admitting: Ophthalmology

## 2013-04-16 DIAGNOSIS — H348392 Tributary (branch) retinal vein occlusion, unspecified eye, stable: Secondary | ICD-10-CM

## 2013-04-16 DIAGNOSIS — H35039 Hypertensive retinopathy, unspecified eye: Secondary | ICD-10-CM

## 2013-04-16 DIAGNOSIS — I10 Essential (primary) hypertension: Secondary | ICD-10-CM

## 2013-05-14 ENCOUNTER — Encounter (INDEPENDENT_AMBULATORY_CARE_PROVIDER_SITE_OTHER): Payer: Medicare Other | Admitting: Ophthalmology

## 2013-05-14 DIAGNOSIS — H43819 Vitreous degeneration, unspecified eye: Secondary | ICD-10-CM

## 2013-05-14 DIAGNOSIS — H348392 Tributary (branch) retinal vein occlusion, unspecified eye, stable: Secondary | ICD-10-CM

## 2013-05-14 DIAGNOSIS — H35039 Hypertensive retinopathy, unspecified eye: Secondary | ICD-10-CM

## 2013-05-14 DIAGNOSIS — I10 Essential (primary) hypertension: Secondary | ICD-10-CM

## 2013-05-14 DIAGNOSIS — H35349 Macular cyst, hole, or pseudohole, unspecified eye: Secondary | ICD-10-CM

## 2013-06-11 ENCOUNTER — Encounter (INDEPENDENT_AMBULATORY_CARE_PROVIDER_SITE_OTHER): Payer: Medicare Other | Admitting: Ophthalmology

## 2013-06-11 DIAGNOSIS — H348392 Tributary (branch) retinal vein occlusion, unspecified eye, stable: Secondary | ICD-10-CM

## 2013-06-11 DIAGNOSIS — H35039 Hypertensive retinopathy, unspecified eye: Secondary | ICD-10-CM

## 2013-06-11 DIAGNOSIS — I10 Essential (primary) hypertension: Secondary | ICD-10-CM

## 2013-06-11 DIAGNOSIS — H35349 Macular cyst, hole, or pseudohole, unspecified eye: Secondary | ICD-10-CM

## 2013-06-16 ENCOUNTER — Other Ambulatory Visit (INDEPENDENT_AMBULATORY_CARE_PROVIDER_SITE_OTHER): Payer: Medicare Other

## 2013-06-16 DIAGNOSIS — E785 Hyperlipidemia, unspecified: Secondary | ICD-10-CM

## 2013-06-16 DIAGNOSIS — I10 Essential (primary) hypertension: Secondary | ICD-10-CM

## 2013-06-16 NOTE — Progress Notes (Signed)
Pt came in for labs only and didn't want NMR due to insurance

## 2013-06-17 LAB — CMP14+EGFR
ALT: 21 IU/L (ref 0–32)
AST: 16 IU/L (ref 0–40)
Calcium: 9.4 mg/dL (ref 8.6–10.2)
Chloride: 100 mmol/L (ref 97–108)
Glucose: 103 mg/dL — ABNORMAL HIGH (ref 65–99)
Potassium: 4 mmol/L (ref 3.5–5.2)
Sodium: 141 mmol/L (ref 134–144)
Total Protein: 6.3 g/dL (ref 6.0–8.5)

## 2013-06-17 LAB — LIPID PANEL
Cholesterol, Total: 158 mg/dL (ref 100–199)
HDL: 45 mg/dL (ref >39)
VLDL Cholesterol Cal: 27 mg/dL (ref 5–40)

## 2013-06-30 ENCOUNTER — Other Ambulatory Visit: Payer: Self-pay | Admitting: *Deleted

## 2013-06-30 DIAGNOSIS — E785 Hyperlipidemia, unspecified: Secondary | ICD-10-CM

## 2013-06-30 MED ORDER — ATORVASTATIN CALCIUM 40 MG PO TABS: 40.0000 mg | ORAL_TABLET | Freq: Every day | ORAL | Status: DC

## 2013-07-01 ENCOUNTER — Ambulatory Visit (INDEPENDENT_AMBULATORY_CARE_PROVIDER_SITE_OTHER): Payer: Medicare Other | Admitting: General Practice

## 2013-07-01 ENCOUNTER — Encounter: Payer: Self-pay | Admitting: General Practice

## 2013-07-01 VITALS — BP 156/78 | HR 73 | Temp 98.5°F | Ht 66.0 in | Wt 139.0 lb

## 2013-07-01 DIAGNOSIS — F329 Major depressive disorder, single episode, unspecified: Secondary | ICD-10-CM

## 2013-07-01 DIAGNOSIS — L719 Rosacea, unspecified: Secondary | ICD-10-CM

## 2013-07-01 DIAGNOSIS — E785 Hyperlipidemia, unspecified: Secondary | ICD-10-CM

## 2013-07-01 DIAGNOSIS — H8110 Benign paroxysmal vertigo, unspecified ear: Secondary | ICD-10-CM

## 2013-07-01 DIAGNOSIS — I10 Essential (primary) hypertension: Secondary | ICD-10-CM

## 2013-07-01 DIAGNOSIS — F32A Depression, unspecified: Secondary | ICD-10-CM

## 2013-07-01 MED ORDER — MECLIZINE HCL 25 MG PO TABS: 25.0000 mg | ORAL_TABLET | Freq: Three times a day (TID) | ORAL | Status: DC | PRN

## 2013-07-01 MED ORDER — ATORVASTATIN CALCIUM 40 MG PO TABS: 40.0000 mg | ORAL_TABLET | Freq: Every day | ORAL | Status: DC

## 2013-07-01 MED ORDER — METRONIDAZOLE 0.75 % EX CREA: TOPICAL_CREAM | Freq: Two times a day (BID) | CUTANEOUS | Status: DC

## 2013-07-01 MED ORDER — BUPROPION HCL ER (SR) 150 MG PO TB12: 150.0000 mg | ORAL_TABLET | Freq: Two times a day (BID) | ORAL | Status: DC

## 2013-07-01 MED ORDER — LISINOPRIL 20 MG PO TABS: 20.0000 mg | ORAL_TABLET | Freq: Every day | ORAL | Status: DC

## 2013-07-01 MED ORDER — BUPROPION HCL ER (SR) 150 MG PO TB12: 150.0000 mg | ORAL_TABLET | Freq: Every day | ORAL | Status: DC

## 2013-07-01 NOTE — Patient Instructions (Signed)
Bupropion sustained-release tablets (Depression/Mood Disorders) What is this medicine? BUPROPION (byoo PROE pee on) is used to treat depression. This medicine may be used for other purposes; ask your health care provider or pharmacist if you have questions. What should I tell my health care provider before I take this medicine? They need to know if you have any of these conditions: -an eating disorder, such as anorexia or bulimia -bipolar disorder or psychosis -diabetes or high blood sugar, treated with medication -head injury or brain tumor -heart disease, previous heart attack, or irregular heart beat -high blood pressure -kidney or liver disease -seizures -suicidal thoughts or a previous suicide attempt -Tourette's syndrome -weight loss -an unusual or allergic reaction to bupropion, other medicines, foods, dyes, or preservatives -breast-feeding -pregnant or trying to become pregnant How should I use this medicine? Take this medicine by mouth with a glass of water. Follow the directions on the prescription label. You can take it with or without food. If it upsets your stomach, take it with food. Do not cut, crush or chew this medicine. Take your medicine at regular intervals. If you take this medicine more than once a day, take your second dose at least 8 hours after you take your first dose. To limit difficulty in sleeping, avoid taking this medicine at bedtime. Do not take your medicine more often than directed. Do not stop taking this medicine suddenly except upon the advice of your doctor. Stopping this medicine too quickly may cause serious side effects or your condition may worsen. A special MedGuide will be given to you by the pharmacist with each prescription and refill. Be sure to read this information carefully each time. Talk to your pediatrician regarding the use of this medicine in children. Special care may be needed. Overdosage: If you think you have taken too much of this  medicine contact a poison control center or emergency room at once. NOTE: This medicine is only for you. Do not share this medicine with others. What if I miss a dose? If you miss a dose, skip the missed dose and take your next tablet at the regular time. There should be at least 8 hours between doses. Do not take double or extra doses. What may interact with this medicine? Do not take this medicine with any of the following medications: -linezolid -MAOIs like Azilect, Carbex, Eldepryl, Marplan, Nardil, and Parnate -methylene blue (injected into a vein) -other medicines that contain bupropion like Zyban This medicine may also interact with the following medications: -alcohol -certain medicines for anxiety or sleep -certain medicines for blood pressure like metoprolol, propranolol -certain medicines for depression or psychotic disturbances -certain medicines for HIV or AIDS like efavirenz, lopinavir, nelfinavir, ritonavir -certain medicines for irregular heart beat like propafenone, flecainide -certain medicines for Parkinson's disease like amantadine, levodopa -certain medicines for seizures like carbamazepine, phenytoin, phenobarbital -cimetidine -clopidogrel -cyclophosphamide -furazolidone -isoniazid -nicotine -orphenadrine -procarbazine -steroid medicines like prednisone or cortisone -stimulant medicines for attention disorders, weight loss, or to stay awake -tamoxifen -theophylline -thiotepa -ticlopidine -tramadol -warfarin This list may not describe all possible interactions. Give your health care provider a list of all the medicines, herbs, non-prescription drugs, or dietary supplements you use. Also tell them if you smoke, drink alcohol, or use illegal drugs. Some items may interact with your medicine. What should I watch for while using this medicine? Tell your doctor if your symptoms do not get better or if they get worse. Visit your doctor or health care professional  for regular checks  on your progress. Because it may take several weeks to see the full effects of this medicine, it is important to continue your treatment as prescribed by your doctor. Patients and their families should watch out for new or worsening thoughts of suicide or depression. Also watch out for sudden changes in feelings such as feeling anxious, agitated, panicky, irritable, hostile, aggressive, impulsive, severely restless, overly excited and hyperactive, or not being able to sleep. If this happens, especially at the beginning of treatment or after a change in dose, call your health care professional. Avoid alcoholic drinks while taking this medicine. Drinking excessive alcoholic beverages, using sleeping or anxiety medicines, or quickly stopping the use of these agents while taking this medicine may increase your risk for a seizure. Do not drive or use heavy machinery until you know how this medicine affects you. This medicine can impair your ability to perform these tasks. Do not take this medicine close to bedtime. It may prevent you from sleeping. Your mouth may get dry. Chewing sugarless gum or sucking hard candy, and drinking plenty of water may help. Contact your doctor if the problem does not go away or is severe. What side effects may I notice from receiving this medicine? Side effects that you should report to your doctor or health care professional as soon as possible: -allergic reactions like skin rash, itching or hives, swelling of the face, lips, or tongue -breathing problems -changes in vision -confusion -fast or irregular heartbeat -hallucinations -increased blood pressure -redness, blistering, peeling or loosening of the skin, including inside the mouth -seizures -suicidal thoughts or other mood changes -unusually weak or tired -vomiting Side effects that usually do not require medical attention (report to your doctor or health care professional if they continue or are  bothersome): -change in sex drive or performance -constipation -headache -loss of appetite -nausea -tremors -weight loss This list may not describe all possible side effects. Call your doctor for medical advice about side effects. You may report side effects to FDA at 1-800-FDA-1088. Where should I keep my medicine? Keep out of the reach of children. Store at room temperature between 20 and 25 degrees C (68 and 77 degrees F), away from direct sunlight and moisture. Keep tightly closed. Throw away any unused medicine after the expiration date. NOTE: This sheet is a summary. It may not cover all possible information. If you have questions about this medicine, talk to your doctor, pharmacist, or health care provider.  2013, [employer] Standard. (02/10/2012 2:03:11 PM)                                                                                     Paxil Taper  Take wellbutrin Friday 07/02/13 and Saturday 07/03/13 Take paxil Monday 07/05/13 and Wednesday 07/07/13 Then take wellbutrin only from Thursday 07/08/13 forward

## 2013-07-01 NOTE — Progress Notes (Signed)
  Subjective:    [name], female    DOB: 05-Jan-1946, 67 y.o.   MRN: [id number]  HPI Patient presents today for chronic health check up. She has a history of hyperlipidemia, hypertension, vertigo, rosacea, and depression. She reports eating healthy, regular exercise (walking 2 miles daily). She reports feeling that her Paxil isn't as effective (mood swings for past 3 months). She reports she has began tapering herself off Paxil, she has been taking one tablet every other day for past three weeks. She is interested in Wellbutrin.     Review of Systems  Constitutional: Negative for fever and chills.  HENT: Negative for neck pain and neck stiffness.   Respiratory: Negative for chest tightness and shortness of breath.   Cardiovascular: Negative for chest pain and palpitations.  Gastrointestinal: Negative for vomiting, abdominal pain, diarrhea, constipation and blood in stool.  Genitourinary: Negative for hematuria and difficulty urinating.  Neurological: Negative for dizziness, weakness and headaches.       Objective:   Physical Exam  Constitutional: She is oriented to person, place, and time. She appears well-developed and well-nourished.  HENT:  Head: Normocephalic and atraumatic.  Cardiovascular: Normal rate, regular rhythm and normal heart sounds.   Pulmonary/Chest: Effort normal and breath sounds normal. No respiratory distress. She exhibits no tenderness.  Abdominal: Soft. Bowel sounds are normal. She exhibits no distension. There is no tenderness.  Neurological: She is alert and oriented to person, place, and time.  Skin: Skin is warm and dry.  Psychiatric: She has a normal mood and affect.          Assessment & Plan:  1. Depression - buPROPion (WELLBUTRIN SR) 150 MG 12 hr tablet; Take 1 tablet (150 mg total) by mouth 2 (two) times daily.  Dispense: 30 tablet; Refill: 0 -continue to wean off paxil (taking wellbutrin SR on days not taking paxil) -Take paxil  on Sun and Wednesday of next week then start continue wellbutrin SR  2. Benign paroxysmal positional vertigo - meclizine (ANTIVERT) 25 MG tablet; Take 1 tablet (25 mg total) by mouth 3 (three) times daily as needed.  Dispense: 90 tablet; Refill: 0  3. Rosacea - metroNIDAZOLE (METROCREAM) 0.75 % cream; Apply topically 2 (two) times daily.  Dispense: 45 g; Refill: 3  4. Essential hypertension, benign -take medications as directed -RTO in one week of taking wellbutrin for follow up Patient verbalized understanding Coralie Keens, FNP-C

## 2013-07-09 ENCOUNTER — Encounter (INDEPENDENT_AMBULATORY_CARE_PROVIDER_SITE_OTHER): Payer: Medicare Other | Admitting: Ophthalmology

## 2013-07-09 DIAGNOSIS — I10 Essential (primary) hypertension: Secondary | ICD-10-CM

## 2013-07-09 DIAGNOSIS — H35039 Hypertensive retinopathy, unspecified eye: Secondary | ICD-10-CM

## 2013-07-09 DIAGNOSIS — H348392 Tributary (branch) retinal vein occlusion, unspecified eye, stable: Secondary | ICD-10-CM

## 2013-07-09 DIAGNOSIS — H35369 Drusen (degenerative) of macula, unspecified eye: Secondary | ICD-10-CM

## 2013-07-15 ENCOUNTER — Encounter: Payer: Self-pay | Admitting: General Practice

## 2013-07-15 ENCOUNTER — Telehealth: Payer: Self-pay | Admitting: General Practice

## 2013-07-15 ENCOUNTER — Ambulatory Visit (INDEPENDENT_AMBULATORY_CARE_PROVIDER_SITE_OTHER): Payer: Medicare Other | Admitting: General Practice

## 2013-07-15 VITALS — BP 143/71 | HR 88 | Temp 97.7°F | Ht 66.0 in | Wt 138.0 lb

## 2013-07-15 DIAGNOSIS — F32A Depression, unspecified: Secondary | ICD-10-CM

## 2013-07-15 DIAGNOSIS — F329 Major depressive disorder, single episode, unspecified: Secondary | ICD-10-CM

## 2013-07-15 MED ORDER — BUPROPION HCL ER (XL) 150 MG PO TB24: 150.0000 mg | ORAL_TABLET | ORAL | Status: DC

## 2013-07-15 NOTE — Telephone Encounter (Signed)
Wellbutrin rx written this am for #30 with 3 refills. Patient requesting a #90 day supply. Ok'd for #90 with no refills. [facility] notified

## 2013-07-15 NOTE — Progress Notes (Signed)
  Subjective:    [name], female    DOB: [birth year], 67 y.o.   MRN: [id number]  HPI Patient presents today for follow up of depression medication. She reports mood swings are better controlled and she has more energy. She reports interacting more with husband, family, and friends. She does report noticing the mediation begins to wear off around 6 pm every evening.     Review of Systems  Constitutional: Negative for fever and chills.  Respiratory: Negative for chest tightness and shortness of breath.   Cardiovascular: Negative for chest pain and palpitations.  Neurological: Negative for dizziness, weakness and headaches.  Psychiatric/Behavioral: Negative for suicidal ideas and agitation. The patient is not nervous/anxious.        Objective:   Physical Exam  Constitutional: She is oriented to person, place, and time. She appears well-developed and well-nourished.  Cardiovascular: Normal rate, regular rhythm and normal heart sounds.   Pulmonary/Chest: Effort normal and breath sounds normal.  Neurological: She is alert and oriented to person, place, and time.  Skin: Skin is warm and dry.  Psychiatric: She has a normal mood and affect.          Assessment & Plan:  1. Depression - buPROPion (WELLBUTRIN XL) 150 MG 24 hr tablet; Take 1 tablet (150 mg total) by mouth every morning.  Dispense: 30 tablet; Refill: 3 -discontinue wellbutrin SR and begin taking wellbutrin xl in am -RTO in 3 months for follow up and sooner if needed -Patient verbalized understanding Coralie Keens, FNP-C

## 2013-08-27 ENCOUNTER — Encounter (INDEPENDENT_AMBULATORY_CARE_PROVIDER_SITE_OTHER): Payer: Medicare Other | Admitting: Ophthalmology

## 2013-08-27 DIAGNOSIS — I10 Essential (primary) hypertension: Secondary | ICD-10-CM

## 2013-08-27 DIAGNOSIS — H348392 Tributary (branch) retinal vein occlusion, unspecified eye, stable: Secondary | ICD-10-CM

## 2013-08-27 DIAGNOSIS — H35039 Hypertensive retinopathy, unspecified eye: Secondary | ICD-10-CM

## 2013-09-17 ENCOUNTER — Other Ambulatory Visit: Payer: Self-pay

## 2013-09-17 DIAGNOSIS — H8110 Benign paroxysmal vertigo, unspecified ear: Secondary | ICD-10-CM

## 2013-09-17 MED ORDER — MECLIZINE HCL 25 MG PO TABS: 25.0000 mg | ORAL_TABLET | Freq: Three times a day (TID) | ORAL | Status: DC | PRN

## 2013-09-20 ENCOUNTER — Other Ambulatory Visit: Payer: Self-pay | Admitting: *Deleted

## 2013-09-20 DIAGNOSIS — H8110 Benign paroxysmal vertigo, unspecified ear: Secondary | ICD-10-CM

## 2013-09-20 MED ORDER — MECLIZINE HCL 25 MG PO TABS: 25.0000 mg | ORAL_TABLET | Freq: Three times a day (TID) | ORAL | Status: DC | PRN

## 2013-10-06 ENCOUNTER — Encounter (INDEPENDENT_AMBULATORY_CARE_PROVIDER_SITE_OTHER): Payer: Medicare Other | Admitting: Ophthalmology

## 2013-10-06 DIAGNOSIS — H35349 Macular cyst, hole, or pseudohole, unspecified eye: Secondary | ICD-10-CM

## 2013-10-06 DIAGNOSIS — H35039 Hypertensive retinopathy, unspecified eye: Secondary | ICD-10-CM

## 2013-10-06 DIAGNOSIS — I10 Essential (primary) hypertension: Secondary | ICD-10-CM

## 2013-10-06 DIAGNOSIS — H348392 Tributary (branch) retinal vein occlusion, unspecified eye, stable: Secondary | ICD-10-CM

## 2013-10-06 DIAGNOSIS — H43819 Vitreous degeneration, unspecified eye: Secondary | ICD-10-CM

## 2013-12-01 ENCOUNTER — Encounter (INDEPENDENT_AMBULATORY_CARE_PROVIDER_SITE_OTHER): Payer: Medicare Other | Admitting: Ophthalmology

## 2013-12-01 DIAGNOSIS — H348392 Tributary (branch) retinal vein occlusion, unspecified eye, stable: Secondary | ICD-10-CM

## 2013-12-01 DIAGNOSIS — I10 Essential (primary) hypertension: Secondary | ICD-10-CM

## 2013-12-01 DIAGNOSIS — H35039 Hypertensive retinopathy, unspecified eye: Secondary | ICD-10-CM

## 2013-12-01 DIAGNOSIS — H35349 Macular cyst, hole, or pseudohole, unspecified eye: Secondary | ICD-10-CM

## 2013-12-28 ENCOUNTER — Encounter (INDEPENDENT_AMBULATORY_CARE_PROVIDER_SITE_OTHER): Payer: Medicare Other | Admitting: Ophthalmology

## 2013-12-28 DIAGNOSIS — H35039 Hypertensive retinopathy, unspecified eye: Secondary | ICD-10-CM

## 2013-12-28 DIAGNOSIS — H35349 Macular cyst, hole, or pseudohole, unspecified eye: Secondary | ICD-10-CM

## 2013-12-28 DIAGNOSIS — I10 Essential (primary) hypertension: Secondary | ICD-10-CM

## 2013-12-28 DIAGNOSIS — H348392 Tributary (branch) retinal vein occlusion, unspecified eye, stable: Secondary | ICD-10-CM

## 2014-01-11 ENCOUNTER — Encounter: Payer: Self-pay | Admitting: General Practice

## 2014-01-11 ENCOUNTER — Ambulatory Visit (INDEPENDENT_AMBULATORY_CARE_PROVIDER_SITE_OTHER): Payer: Medicare Other | Admitting: General Practice

## 2014-01-11 VITALS — BP 143/71 | HR 70 | Temp 97.0°F | Ht 66.0 in | Wt 131.6 lb

## 2014-01-11 DIAGNOSIS — E1159 Type 2 diabetes mellitus with other circulatory complications: Secondary | ICD-10-CM | POA: Insufficient documentation

## 2014-01-11 DIAGNOSIS — I10 Essential (primary) hypertension: Secondary | ICD-10-CM | POA: Insufficient documentation

## 2014-01-11 DIAGNOSIS — E1169 Type 2 diabetes mellitus with other specified complication: Secondary | ICD-10-CM | POA: Insufficient documentation

## 2014-01-11 DIAGNOSIS — R42 Dizziness and giddiness: Secondary | ICD-10-CM | POA: Insufficient documentation

## 2014-01-11 DIAGNOSIS — E785 Hyperlipidemia, unspecified: Secondary | ICD-10-CM | POA: Insufficient documentation

## 2014-01-11 MED ORDER — ATORVASTATIN CALCIUM 40 MG PO TABS: 40.0000 mg | ORAL_TABLET | Freq: Every day | ORAL | Status: DC

## 2014-01-11 MED ORDER — LISINOPRIL 20 MG PO TABS: 20.0000 mg | ORAL_TABLET | Freq: Every day | ORAL | Status: DC

## 2014-01-11 MED ORDER — MECLIZINE HCL 25 MG PO TABS: 25.0000 mg | ORAL_TABLET | Freq: Three times a day (TID) | ORAL | Status: DC | PRN

## 2014-01-11 MED ORDER — HYDROCHLOROTHIAZIDE 25 MG PO TABS: 12.5000 mg | ORAL_TABLET | Freq: Every day | ORAL | Status: DC

## 2014-01-11 NOTE — Patient Instructions (Signed)

## 2014-01-11 NOTE — Progress Notes (Signed)
   Subjective:    [name], female    DOB: May 09, 1946, 68 y.o.   MRN: [id number]  HPI Patient presents today for chronic health follow up. History of hypertension, vertigo, hyperlipidemia, and depression. Denies taking wellbutrin, weaned herself over past month. Taking other medications as directed. She reports blood pressure readings have been elevated. Checking twice daily for past 2 weeks and ranges 140's-170's/70's-105. Reports eating low sodium diet, but denies walking as usual.     Review of Systems  Constitutional: Negative for fever and chills.  Respiratory: Negative for chest tightness and shortness of breath.   Cardiovascular: Negative for chest pain and palpitations.  Gastrointestinal: Negative for nausea, vomiting, abdominal pain, diarrhea, constipation and blood in stool.  Genitourinary: Negative for difficulty urinating.  Neurological: Negative for dizziness, weakness and headaches.       Objective:   Physical Exam  Constitutional: She is oriented to person, place, and time. She appears well-developed and well-nourished.  HENT:  Head: Normocephalic and atraumatic.  Right Ear: External ear normal.  Left Ear: External ear normal.  Eyes: EOM are normal.  Neck: Normal range of motion.  Cardiovascular: Normal rate, regular rhythm and normal heart sounds.   Pulmonary/Chest: Effort normal and breath sounds normal. No respiratory distress.  Abdominal: Soft. Bowel sounds are normal. She exhibits no distension. There is no tenderness.  Neurological: She is alert and oriented to person, place, and time.  Skin: Skin is warm and dry.  Psychiatric: She has a normal mood and affect.          Assessment & Plan:  1. Hypertension  - hydrochlorothiazide (HYDRODIURIL) 25 MG tablet; Take 0.5 tablets (12.5 mg total) by mouth daily.  Dispense: 90 tablet; Refill: 1 - lisinopril (PRINIVIL,ZESTRIL) 20 MG tablet; Take 1 tablet (20 mg total) by mouth daily.  Dispense:  90 tablet; Refill: 1 - CMP14+EGFR  2. Vertigo  - meclizine (ANTIVERT) 25 MG tablet; Take 1 tablet (25 mg total) by mouth 3 (three) times daily as needed.  Dispense: 90 tablet; Refill: 1  3. Hyperlipidemia - atorvastatin (LIPITOR) 40 MG tablet; Take 1 tablet (40 mg total) by mouth daily.  Dispense: 90 tablet; Refill: 1 - Lipid panel -Continue all current medications Labs pending F/u in 2 weeks for blood pressure recheck Discussed benefits of regular exercise and healthy eating Patient verbalized understanding Erby Pian, FNP-C

## 2014-01-12 LAB — CMP14+EGFR
ALT: 16 IU/L (ref 0–32)
AST: 18 IU/L (ref 0–40)
Albumin/Globulin Ratio: 2.1 (ref 1.1–2.5)
Albumin: 4.4 g/dL (ref 3.6–4.8)
Alkaline Phosphatase: 93 IU/L (ref 39–117)
BUN/Creatinine Ratio: 11 (ref 11–26)
BUN: 6 mg/dL — ABNORMAL LOW (ref 8–27)
CALCIUM: 9.6 mg/dL (ref 8.7–10.3)
CO2: 23 mmol/L (ref 18–29)
CREATININE: 0.57 mg/dL (ref 0.57–1.00)
Chloride: 105 mmol/L (ref 97–108)
GFR calc Af Amer: 110 mL/min/{1.73_m2} (ref >59)
GFR calc non Af Amer: 96 mL/min/{1.73_m2} (ref >59)
GLOBULIN, TOTAL: 2.1 g/dL (ref 1.5–4.5)
Glucose: 109 mg/dL — ABNORMAL HIGH (ref 65–99)
Potassium: 4.5 mmol/L (ref 3.5–5.2)
SODIUM: 145 mmol/L — AB (ref 134–144)
Total Bilirubin: 0.3 mg/dL (ref 0.0–1.2)
Total Protein: 6.5 g/dL (ref 6.0–8.5)

## 2014-01-12 LAB — LIPID PANEL
CHOL/HDL RATIO: 3.6 ratio (ref 0.0–4.4)
Cholesterol, Total: 160 mg/dL (ref 100–199)
HDL: 44 mg/dL (ref >39)
LDL Calculated: 92 mg/dL (ref 0–99)
TRIGLYCERIDES: 122 mg/dL (ref 0–149)
VLDL Cholesterol Cal: 24 mg/dL (ref 5–40)

## 2014-02-01 ENCOUNTER — Encounter (INDEPENDENT_AMBULATORY_CARE_PROVIDER_SITE_OTHER): Payer: Medicare Other | Admitting: Ophthalmology

## 2014-02-01 DIAGNOSIS — H35349 Macular cyst, hole, or pseudohole, unspecified eye: Secondary | ICD-10-CM

## 2014-02-01 DIAGNOSIS — I10 Essential (primary) hypertension: Secondary | ICD-10-CM

## 2014-02-01 DIAGNOSIS — H348392 Tributary (branch) retinal vein occlusion, unspecified eye, stable: Secondary | ICD-10-CM

## 2014-02-01 DIAGNOSIS — H35039 Hypertensive retinopathy, unspecified eye: Secondary | ICD-10-CM

## 2014-02-08 ENCOUNTER — Telehealth: Payer: Self-pay | Admitting: General Practice

## 2014-02-09 ENCOUNTER — Encounter: Payer: Self-pay | Admitting: Family

## 2014-02-09 ENCOUNTER — Ambulatory Visit (INDEPENDENT_AMBULATORY_CARE_PROVIDER_SITE_OTHER): Payer: Medicare Other | Admitting: Family

## 2014-02-09 VITALS — BP 195/90 | HR 79 | Temp 98.1°F | Ht 66.0 in | Wt 129.0 lb

## 2014-02-09 DIAGNOSIS — I10 Essential (primary) hypertension: Secondary | ICD-10-CM

## 2014-02-09 MED ORDER — LISINOPRIL 40 MG PO TABS: 40.0000 mg | ORAL_TABLET | Freq: Every day | ORAL | Status: DC

## 2014-02-09 NOTE — Telephone Encounter (Signed)
Patient was to follow-up 2 weeks after her last appt.  Appt scheduled for today with Mckenzie Dun, FNP and she can discuss with Dr. Laurance Flatten.

## 2014-02-09 NOTE — Patient Instructions (Addendum)
Hypertension  As your heart beats, it forces blood through your arteries. This force is your blood pressure. If the pressure is too high, it is called hypertension (HTN) or high blood pressure. HTN is dangerous because you may have it and not know it. High blood pressure may mean that your heart has to work harder to pump blood. Your arteries may be narrow or stiff. The extra work puts you at risk for heart disease, stroke, and other problems.   Blood pressure consists of two numbers, a higher number over a lower, 110/72, for example. It is stated as "110 over 72." The ideal is below 120 for the top number (systolic) and under 80 for the bottom (diastolic). Write down your blood pressure today.  You should pay close attention to your blood pressure if you have certain conditions such as:   Heart failure.   Prior heart attack.   Diabetes   Chronic kidney disease.   Prior stroke.   Multiple risk factors for heart disease.  To see if you have HTN, your blood pressure should be measured while you are seated with your arm held at the level of the heart. It should be measured at least twice. A one-time elevated blood pressure reading (especially in the Emergency Department) does not mean that you need treatment. There may be conditions in which the blood pressure is different between your right and left arms. It is important to see your caregiver soon for a recheck.  Most people have essential hypertension which means that there is not a specific cause. This type of high blood pressure may be lowered by changing lifestyle factors such as:   Stress.   Smoking.   Lack of exercise.   Excessive weight.   Drug/tobacco/alcohol use.   Eating less salt.  Most people do not have symptoms from high blood pressure until it has caused damage to the body. Effective treatment can often prevent, delay or reduce that damage.  TREATMENT    When a cause has been identified, treatment for high blood pressure is directed at the cause. There are a large number of medications to treat HTN. These fall into several categories, and your caregiver will help you select the medicines that are best for you. Medications may have side effects. You should review side effects with your caregiver.  If your blood pressure stays high after you have made lifestyle changes or started on medicines,    Your medication(s) may need to be changed.   Other problems may need to be addressed.   Be certain you understand your prescriptions, and know how and when to take your medicine.   Be sure to follow up with your caregiver within the time frame advised (usually within two weeks) to have your blood pressure rechecked and to review your medications.   If you are taking more than one medicine to lower your blood pressure, make sure you know how and at what times they should be taken. Taking two medicines at the same time can result in blood pressure that is too low.  SEEK IMMEDIATE MEDICAL CARE IF:   You develop a severe headache, blurred or changing vision, or confusion.   You have unusual weakness or numbness, or a faint feeling.   You have severe chest or abdominal pain, vomiting, or breathing problems.  MAKE SURE YOU:    Understand these instructions.   Will watch your condition.   Will get help right away if you are not doing well   or get worse.  Document Released: 09/23/2005 Document Revised: 12/16/2011 Document Reviewed: 05/13/2008  ExitCare Patient Information 2014 [employer]  DASH Diet   The DASH diet stands for "Dietary Approaches to Stop Hypertension." It is a healthy eating plan that has been shown to reduce high blood pressure (hypertension) in as little as 14 days, while also possibly providing other significant health benefits. These other health benefits include reducing the risk of breast cancer after menopause and reducing the risk of type 2 diabetes, heart disease, colon cancer, and stroke. Health benefits also include weight loss and slowing kidney failure in patients with chronic kidney disease.   DIET GUIDELINES   Limit salt (sodium). Your diet should contain less than 1500 mg of sodium daily.   Limit refined or processed carbohydrates. Your diet should include mostly whole grains. Desserts and added sugars should be used sparingly.   Include small amounts of heart-healthy fats. These types of fats include nuts, oils, and tub margarine. Limit saturated and trans fats. These fats have been shown to be harmful in the body.  CHOOSING FOODS   The following food groups are based on a 2000 calorie diet. See your Registered Dietitian for individual calorie needs.  Grains and Grain Products (6 to 8 servings daily)   Eat More Often: Whole-wheat bread, brown rice, whole-grain or wheat pasta, quinoa, popcorn without added fat or salt (air popped).   Eat Less Often: White bread, white pasta, white rice, cornbread.  Vegetables (4 to 5 servings daily)   Eat More Often: Fresh, frozen, and canned vegetables. Vegetables may be raw, steamed, roasted, or grilled with a minimal amount of fat.   Eat Less Often/Avoid: Creamed or fried vegetables. Vegetables in a cheese sauce.  Fruit (4 to 5 servings daily)   Eat More Often: All fresh, canned (in natural juice), or frozen fruits. Dried fruits without added sugar. One hundred percent fruit juice ( cup [237 mL] daily).   Eat Less Often: Dried fruits with added sugar. Canned fruit in light or heavy syrup.   Lean Meats, Fish, and Poultry (2 servings or less daily. One serving is 3 to 4 oz [85-114 g]).   Eat More Often: Ninety percent or leaner ground beef, tenderloin, sirloin. Round cuts of beef, chicken breast, turkey breast. All fish. Grill, bake, or broil your meat. Nothing should be fried.   Eat Less Often/Avoid: Fatty cuts of meat, turkey, or chicken leg, thigh, or wing. Fried cuts of meat or fish.  Dairy (2 to 3 servings)   Eat More Often: Low-fat or fat-free milk, low-fat plain or light yogurt, reduced-fat or part-skim cheese.   Eat Less Often/Avoid: Milk (whole, 2%).Whole milk yogurt. Full-fat cheeses.  Nuts, Seeds, and Legumes (4 to 5 servings per week)   Eat More Often: All without added salt.   Eat Less Often/Avoid: Salted nuts and seeds, canned beans with added salt.  Fats and Sweets (limited)   Eat More Often: Vegetable oils, tub margarines without trans fats, sugar-free gelatin. Mayonnaise and salad dressings.   Eat Less Often/Avoid: Coconut oils, palm oils, butter, stick margarine, cream, half and half, cookies, candy, pie.  FOR MORE INFORMATION  The Dash Diet Eating Plan: www.dashdiet.org  Document Released: 09/12/2011 Document Revised: 12/16/2011 Document Reviewed: 09/12/2011  ExitCare Patient Information 2014 [employer].

## 2014-02-09 NOTE — Progress Notes (Signed)
   Subjective:    [name], female    DOB: 04-Apr-1946, 68 y.o.   MRN: [id number]  Hypertension This is a chronic problem. The current episode started more than 1 year ago. The problem has been waxing and waning since onset. Associated symptoms include blurred vision and palpitations. Pertinent negatives include no headaches, peripheral edema or shortness of breath. Risk factors for coronary artery disease include post-menopausal state and dyslipidemia. Past treatments include diuretics and ACE inhibitors. The current treatment provides mild improvement. Compliance problems include exercise.  Hypertensive end-organ damage includes retinopathy.      Review of Systems  Eyes: Positive for blurred vision.  Respiratory: Negative for shortness of breath.   Cardiovascular: Positive for palpitations.  Neurological: Negative for headaches.  All other systems reviewed and are negative.      Objective:   Physical Exam  Vitals reviewed. Constitutional: She is oriented to person, place, and time. She appears well-developed and well-nourished. No distress.  Cardiovascular: Normal rate, regular rhythm, normal heart sounds and intact distal pulses.   No murmur heard. Pulmonary/Chest: Effort normal and breath sounds normal. No respiratory distress. She has no wheezes.  Abdominal: Soft. Bowel sounds are normal. She exhibits no distension. There is no tenderness.  Musculoskeletal: Normal range of motion. She exhibits no edema and no tenderness.  Neurological: She is alert and oriented to person, place, and time.  Skin: Skin is warm and dry.  Psychiatric: She has a normal mood and affect. Her behavior is normal. Judgment and thought content normal.     BP 191/84  Pulse 79  Temp(Src) 98.1 F (36.7 C) (Oral)  Ht 5\' 6"  (1.676 m)  Wt 129 lb (58.514 kg)  BMI 20.83 kg/m2      Assessment & Plan:  1. Hypertension -Daily blood pressure log given with instructions on how to fill out  and told to bring to next visit -Dash diet information given -Exercise encouraged - Stress Management  -Continue current meds -RTO in 2 weeks - lisinopril (PRINIVIL,ZESTRIL) 40 MG tablet; Take 1 tablet (40 mg total) by mouth daily.  Dispense: 90 tablet; Refill: 3 - Basic Metabolic Panel  Evelina Dun, FNP

## 2014-02-10 LAB — BASIC METABOLIC PANEL
BUN / CREAT RATIO: 20 (ref 11–26)
BUN: 11 mg/dL (ref 8–27)
CO2: 26 mmol/L (ref 18–29)
Calcium: 9.8 mg/dL (ref 8.7–10.3)
Chloride: 92 mmol/L — ABNORMAL LOW (ref 97–108)
Creatinine, Ser: 0.55 mg/dL — ABNORMAL LOW (ref 0.57–1.00)
GFR, EST AFRICAN AMERICAN: 111 mL/min/{1.73_m2} (ref >59)
GFR, EST NON AFRICAN AMERICAN: 97 mL/min/{1.73_m2} (ref >59)
Glucose: 106 mg/dL — ABNORMAL HIGH (ref 65–99)
POTASSIUM: 3.9 mmol/L (ref 3.5–5.2)
SODIUM: 132 mmol/L — AB (ref 134–144)

## 2014-02-15 ENCOUNTER — Telehealth: Payer: Self-pay | Admitting: Family

## 2014-02-15 NOTE — Telephone Encounter (Signed)
Message copied by Cline Crock on Tue Feb 15, 2014 11:03 AM ------      Message from: Lenna Gilford, [place] A      Created: Thu Feb 10, 2014 11:28 AM       Pt's lab within normal limits. Pt to continue with adequate hydration and weight bearing exercises. ------

## 2014-02-23 ENCOUNTER — Ambulatory Visit (INDEPENDENT_AMBULATORY_CARE_PROVIDER_SITE_OTHER): Payer: Medicare Other | Admitting: Family

## 2014-02-23 VITALS — BP 154/77 | HR 71 | Ht 66.0 in | Wt 127.0 lb

## 2014-02-23 DIAGNOSIS — I10 Essential (primary) hypertension: Secondary | ICD-10-CM

## 2014-02-23 MED ORDER — HYDROCHLOROTHIAZIDE 25 MG PO TABS: 25.0000 mg | ORAL_TABLET | Freq: Every day | ORAL | Status: DC

## 2014-02-23 NOTE — Patient Instructions (Signed)

## 2014-02-23 NOTE — Progress Notes (Signed)
   Subjective:    [name], female    DOB: [birth year], 68 y.o.   MRN: [id number]  HPI Pt here for two week follow-up for blood pressure. Today's bp is 154/77 and she brought in home blood pressure log (avg 150s/ 75s). Pt states she is walking 4 miles day. States she gets dizzy getting out of bed the mornings. States usually only happens in the morning when she does not sit on the side of the bed first.   Pt states she would like to try to "increase her fluid pill" and she will monitor her BP at home. She is worried her BP is effecting her eye sight and does not want her BP high.    Review of Systems  Respiratory: Negative.   Gastrointestinal: Negative.   Genitourinary: Negative.   Musculoskeletal: Negative.   All other systems reviewed and are negative.      Objective:   Physical Exam  Vitals reviewed. Constitutional: She is oriented to person, place, and time. She appears well-developed and well-nourished. No distress.  Cardiovascular: Normal rate, regular rhythm, normal heart sounds and intact distal pulses.   No murmur heard. Pulmonary/Chest: Effort normal and breath sounds normal. No respiratory distress. She has no wheezes.  Musculoskeletal: Normal range of motion. She exhibits no edema and no tenderness.  Neurological: She is alert and oriented to person, place, and time. She has normal reflexes.  Skin: Skin is warm and dry.  Psychiatric: She has a normal mood and affect. Her behavior is normal. Judgment and thought content normal.     BP 154/77  Pulse 71  Ht 5\' 6"  (1.676 m)  Wt 127 lb (57.607 kg)  BMI 20.51 kg/m2      Assessment & Plan:  1. Hypertension -Exercise encouraged - Stress Management  -Continue current meds -Pt to take BP at home and report any dizziness or low readings -RTO in 3 months - hydrochlorothiazide (HYDRODIURIL) 25 MG tablet; Take 1 tablet (25 mg total) by mouth daily.  Dispense: 90 tablet; Refill: [place],  FNP

## 2014-03-08 ENCOUNTER — Encounter (INDEPENDENT_AMBULATORY_CARE_PROVIDER_SITE_OTHER): Payer: Medicare Other | Admitting: Ophthalmology

## 2014-03-08 DIAGNOSIS — H348392 Tributary (branch) retinal vein occlusion, unspecified eye, stable: Secondary | ICD-10-CM

## 2014-03-08 DIAGNOSIS — H35039 Hypertensive retinopathy, unspecified eye: Secondary | ICD-10-CM

## 2014-03-08 DIAGNOSIS — H35349 Macular cyst, hole, or pseudohole, unspecified eye: Secondary | ICD-10-CM

## 2014-03-08 DIAGNOSIS — I10 Essential (primary) hypertension: Secondary | ICD-10-CM

## 2014-04-12 ENCOUNTER — Encounter (INDEPENDENT_AMBULATORY_CARE_PROVIDER_SITE_OTHER): Payer: Medicare Other | Admitting: Ophthalmology

## 2014-04-12 DIAGNOSIS — H3530 Unspecified macular degeneration: Secondary | ICD-10-CM

## 2014-04-12 DIAGNOSIS — H348392 Tributary (branch) retinal vein occlusion, unspecified eye, stable: Secondary | ICD-10-CM

## 2014-04-12 DIAGNOSIS — I10 Essential (primary) hypertension: Secondary | ICD-10-CM

## 2014-04-12 DIAGNOSIS — H35039 Hypertensive retinopathy, unspecified eye: Secondary | ICD-10-CM

## 2014-04-12 DIAGNOSIS — H43819 Vitreous degeneration, unspecified eye: Secondary | ICD-10-CM

## 2014-05-17 ENCOUNTER — Encounter (INDEPENDENT_AMBULATORY_CARE_PROVIDER_SITE_OTHER): Payer: Medicare Other | Admitting: Ophthalmology

## 2014-05-17 DIAGNOSIS — H3530 Unspecified macular degeneration: Secondary | ICD-10-CM

## 2014-05-17 DIAGNOSIS — H348392 Tributary (branch) retinal vein occlusion, unspecified eye, stable: Secondary | ICD-10-CM

## 2014-05-17 DIAGNOSIS — I10 Essential (primary) hypertension: Secondary | ICD-10-CM

## 2014-05-17 DIAGNOSIS — H35039 Hypertensive retinopathy, unspecified eye: Secondary | ICD-10-CM

## 2014-05-17 DIAGNOSIS — H35349 Macular cyst, hole, or pseudohole, unspecified eye: Secondary | ICD-10-CM

## 2014-06-21 ENCOUNTER — Encounter (INDEPENDENT_AMBULATORY_CARE_PROVIDER_SITE_OTHER): Payer: Medicare Other | Admitting: Ophthalmology

## 2014-06-21 DIAGNOSIS — I10 Essential (primary) hypertension: Secondary | ICD-10-CM

## 2014-06-21 DIAGNOSIS — H35039 Hypertensive retinopathy, unspecified eye: Secondary | ICD-10-CM

## 2014-06-21 DIAGNOSIS — H348392 Tributary (branch) retinal vein occlusion, unspecified eye, stable: Secondary | ICD-10-CM

## 2014-06-21 DIAGNOSIS — H3530 Unspecified macular degeneration: Secondary | ICD-10-CM

## 2014-07-26 ENCOUNTER — Encounter (INDEPENDENT_AMBULATORY_CARE_PROVIDER_SITE_OTHER): Payer: Medicare Other | Admitting: Ophthalmology

## 2014-07-26 DIAGNOSIS — H35033 Hypertensive retinopathy, bilateral: Secondary | ICD-10-CM

## 2014-07-26 DIAGNOSIS — I10 Essential (primary) hypertension: Secondary | ICD-10-CM

## 2014-07-26 DIAGNOSIS — H35343 Macular cyst, hole, or pseudohole, bilateral: Secondary | ICD-10-CM

## 2014-07-26 DIAGNOSIS — H34831 Tributary (branch) retinal vein occlusion, right eye: Secondary | ICD-10-CM

## 2014-08-30 ENCOUNTER — Encounter (INDEPENDENT_AMBULATORY_CARE_PROVIDER_SITE_OTHER): Payer: Medicare Other | Admitting: Ophthalmology

## 2014-08-30 DIAGNOSIS — I10 Essential (primary) hypertension: Secondary | ICD-10-CM

## 2014-08-30 DIAGNOSIS — H34831 Tributary (branch) retinal vein occlusion, right eye: Secondary | ICD-10-CM

## 2014-08-30 DIAGNOSIS — H35033 Hypertensive retinopathy, bilateral: Secondary | ICD-10-CM

## 2014-09-27 ENCOUNTER — Encounter (INDEPENDENT_AMBULATORY_CARE_PROVIDER_SITE_OTHER): Payer: Medicare Other | Admitting: Ophthalmology

## 2014-09-27 DIAGNOSIS — H34831 Tributary (branch) retinal vein occlusion, right eye: Secondary | ICD-10-CM

## 2014-09-27 DIAGNOSIS — I10 Essential (primary) hypertension: Secondary | ICD-10-CM

## 2014-09-27 DIAGNOSIS — H35033 Hypertensive retinopathy, bilateral: Secondary | ICD-10-CM

## 2014-10-06 ENCOUNTER — Encounter: Payer: Self-pay | Admitting: Family Medicine

## 2014-10-06 ENCOUNTER — Ambulatory Visit (INDEPENDENT_AMBULATORY_CARE_PROVIDER_SITE_OTHER): Payer: Medicare Other | Admitting: Family Medicine

## 2014-10-06 VITALS — BP 205/93 | HR 79 | Temp 97.3°F | Ht 66.0 in | Wt 113.0 lb

## 2014-10-06 DIAGNOSIS — R300 Dysuria: Secondary | ICD-10-CM

## 2014-10-06 DIAGNOSIS — I10 Essential (primary) hypertension: Secondary | ICD-10-CM

## 2014-10-06 DIAGNOSIS — R634 Abnormal weight loss: Secondary | ICD-10-CM

## 2014-10-06 DIAGNOSIS — R319 Hematuria, unspecified: Secondary | ICD-10-CM

## 2014-10-06 LAB — POCT URINALYSIS DIPSTICK
Bilirubin, UA: NEGATIVE
Glucose, UA: NEGATIVE
KETONES UA: NEGATIVE
NITRITE UA: NEGATIVE
PH UA: 5
Spec Grav, UA: 1.01
Urobilinogen, UA: NEGATIVE

## 2014-10-06 LAB — POCT UA - MICROSCOPIC ONLY
CASTS, UR, LPF, POC: NEGATIVE
Crystals, Ur, HPF, POC: NEGATIVE
Yeast, UA: NEGATIVE

## 2014-10-06 MED ORDER — AMLODIPINE BESYLATE 5 MG PO TABS: 5.0000 mg | ORAL_TABLET | Freq: Every day | ORAL | Status: DC

## 2014-10-06 MED ORDER — SULFAMETHOXAZOLE-TRIMETHOPRIM 800-160 MG PO TABS: ORAL_TABLET | ORAL | Status: DC

## 2014-10-06 NOTE — Progress Notes (Signed)
Subjective:    [name], female    DOB: [birth year], 68 y.o.   MRN: [id number]  HPI Pt here today for blood tinged urine and dysuria that started yesterday. The patient indicates she's been walking a lot and has lost a lot of weight over the past several months. She indicates that back in September she went to urgent care and she had some blood in the urine and some side pain and it was thought that she might have a kidney stone but all of this got better. She indicates that her blood pressure readings have been very erratic and may be anywhere from 120-195/70-95 at home. The symptoms with burning and blood in the urine started yesterday.      Patient Active Problem List   Diagnosis Date Noted  . Hypertension 01/11/2014  . Vertigo 01/11/2014  . Hyperlipidemia 01/11/2014   Outpatient Encounter Prescriptions as of 10/06/2014  Medication Sig  . CINNAMON PO Take 1 capsule by mouth daily.  . COMBIGAN 0.2-0.5 % ophthalmic solution   . lisinopril (PRINIVIL,ZESTRIL) 40 MG tablet Take 1 tablet (40 mg total) by mouth daily.  Marland Kitchen loteprednol (LOTEMAX) 0.5 % ophthalmic suspension Place 1 drop into the right eye 4 (four) times daily.  . metroNIDAZOLE (METROCREAM) 0.75 % cream Apply topically 2 (two) times daily.  . Omega-3 Fatty Acids (FISH OIL PO) Take 1 capsule by mouth daily.  Marland Kitchen PROLENSA 0.07 % SOLN   . TRAVATAN Z 0.004 % SOLN ophthalmic solution   . [DISCONTINUED] atorvastatin (LIPITOR) 40 MG tablet Take 1 tablet (40 mg total) by mouth daily.  . [DISCONTINUED] brimonidine (ALPHAGAN P) 0.1 % SOLN Place 1 drop into the right eye 2 (two) times daily.   . [DISCONTINUED] buPROPion (WELLBUTRIN XL) 150 MG 24 hr tablet Take 1 tablet (150 mg total) by mouth every morning.  . [DISCONTINUED] Cyanocobalamin (VITAMIN B-12 PO) Take 1 tablet by mouth daily.  . [DISCONTINUED] Flaxseed, Linseed, (FLAXSEED OIL PO) Take 1 capsule by mouth daily.  . [DISCONTINUED] hydrochlorothiazide  (HYDRODIURIL) 25 MG tablet Take 1 tablet (25 mg total) by mouth daily.  . [DISCONTINUED] meclizine (ANTIVERT) 25 MG tablet Take 1 tablet (25 mg total) by mouth 3 (three) times daily as needed.    Review of Systems  Constitutional: Negative.   HENT: Negative.   Eyes: Negative.   Respiratory: Negative.   Cardiovascular: Negative.   Gastrointestinal: Negative.   Endocrine: Negative.   Genitourinary: Positive for dysuria and hematuria.  Musculoskeletal: Negative.   Skin: Negative.   Allergic/Immunologic: Negative.   Neurological: Negative.   Hematological: Negative.   Psychiatric/Behavioral: Negative.        Objective:   Physical Exam  Constitutional: She is oriented to person, place, and time. No distress.  The patient was thin and somewhat older looking than her stated age. She is concerned about her blood pressure being elevated.  HENT:  Head: Normocephalic and atraumatic.  Eyes: Conjunctivae and EOM are normal. Pupils are equal, round, and reactive to light. Right eye exhibits no discharge. Left eye exhibits no discharge. No scleral icterus.  Neck: Normal range of motion.  Cardiovascular: Normal rate, regular rhythm and normal heart sounds.   No murmur heard. At 84/m  Pulmonary/Chest: Effort normal and breath sounds normal. No respiratory distress. She has no wheezes. She has no rales. She exhibits no tenderness.  Abdominal: Soft. Bowel sounds are normal. She exhibits no mass. There is tenderness. There is no rebound and no guarding.  Musculoskeletal:  Normal range of motion. She exhibits no edema.  Lymphadenopathy:    She has no cervical adenopathy.  Neurological: She is alert and oriented to person, place, and time.  Skin: Skin is warm and dry. No rash noted.  Psychiatric: She has a normal mood and affect. Her behavior is normal. Judgment and thought content normal.  Nursing note and vitals reviewed.  BP 204/79 mmHg  Pulse 79  Temp(Src) 97.3 F (36.3 C) (Oral)  Ht 5'  6" (1.676 m)  Wt 113 lb (51.256 kg)  BMI 18.25 kg/m2  The urinalysis had a lot of  red blood cells but occasional WBC She will have a BMP drawn today.      Assessment & Plan:  1. Dysuria - POCT UA - Microscopic Only - POCT urinalysis dipstick - Urine culture - sulfamethoxazole-trimethoprim (BACTRIM DS,SEPTRA DS) 800-160 MG per tablet; Take one twice daily with food  Dispense: 20 tablet; Refill: 0  2. Essential hypertension - amLODipine (NORVASC) 5 MG tablet; Take 1 tablet (5 mg total) by mouth daily.  Dispense: 30 tablet; Refill: 3 - BMP8+EGFR  3. Loss of weight  4. Hematuria - sulfamethoxazole-trimethoprim (BACTRIM DS,SEPTRA DS) 800-160 MG per tablet; Take one twice daily with food  Dispense: 20 tablet; Refill: 0  Patient Instructions  Drink plenty of fluids Do not take any over-the-counter medication other than Coricidin HB for head congestion and cold Watch sodium intake other than using nasal saline for head congestion Take additional blood pressure medication as directed Take antibiotic as directed Come by the office in about a week to recheck blood pressure and to get fasting lab work and bring home blood pressure readings with you at the time. Also a urinalysis should be rechecked in one week. If the blood continues in the urine, the patient should have a CT scan of the abdomen and a visit scheduled with the urologist to follow-up on the hematuria   Arrie Senate MD

## 2014-10-06 NOTE — Patient Instructions (Addendum)
Drink plenty of fluids Do not take any over-the-counter medication other than Coricidin HB for head congestion and cold Watch sodium intake other than using nasal saline for head congestion Take additional blood pressure medication as directed Take antibiotic as directed Come by the office in about a week to recheck blood pressure and to get fasting lab work and bring home blood pressure readings with you at the time. Also a urinalysis should be rechecked in one week. If the blood continues in the urine, the patient should have a CT scan of the abdomen and a visit scheduled with the urologist to follow-up on the hematuria

## 2014-10-07 LAB — BMP8+EGFR
BUN/Creatinine Ratio: 12 (ref 11–26)
BUN: 7 mg/dL — ABNORMAL LOW (ref 8–27)
CALCIUM: 9.7 mg/dL (ref 8.7–10.3)
CHLORIDE: 100 mmol/L (ref 97–108)
CO2: 21 mmol/L (ref 18–29)
Creatinine, Ser: 0.57 mg/dL (ref 0.57–1.00)
GFR calc Af Amer: 109 mL/min/{1.73_m2} (ref >59)
GFR, EST NON AFRICAN AMERICAN: 95 mL/min/{1.73_m2} (ref >59)
GLUCOSE: 98 mg/dL (ref 65–99)
POTASSIUM: 4.3 mmol/L (ref 3.5–5.2)
SODIUM: 141 mmol/L (ref 134–144)

## 2014-10-08 LAB — URINE CULTURE

## 2014-10-10 ENCOUNTER — Encounter: Payer: Self-pay | Admitting: *Deleted

## 2014-10-10 NOTE — Progress Notes (Unsigned)
Mckenzie [name],   Can you please look through this patients med list. She and Dr Laurance Flatten are wondering if any of her eye drops could be contributing to her elevated BP  Can you check and let us know.

## 2014-10-11 ENCOUNTER — Telehealth: Payer: Self-pay | Admitting: *Deleted

## 2014-10-11 NOTE — Telephone Encounter (Signed)
-----   Message from [place], [place] sent at 10/11/2014 11:47 AM EST ----- Regarding: eye drops and hypertension Hi Mckenzie [name],  I did some research and none of her eye drops could be causing an increase in her blood pressure.  Thanks  [employer]

## 2014-10-11 NOTE — Telephone Encounter (Signed)
Pt aware of michelles- report

## 2014-10-14 ENCOUNTER — Encounter: Payer: Self-pay | Admitting: Family Medicine

## 2014-10-14 ENCOUNTER — Ambulatory Visit (INDEPENDENT_AMBULATORY_CARE_PROVIDER_SITE_OTHER): Payer: Medicare HMO | Admitting: Family Medicine

## 2014-10-14 VITALS — BP 176/85 | HR 72 | Temp 97.0°F | Ht 66.0 in | Wt 112.0 lb

## 2014-10-14 DIAGNOSIS — F411 Generalized anxiety disorder: Secondary | ICD-10-CM

## 2014-10-14 DIAGNOSIS — R319 Hematuria, unspecified: Secondary | ICD-10-CM

## 2014-10-14 DIAGNOSIS — N390 Urinary tract infection, site not specified: Secondary | ICD-10-CM

## 2014-10-14 DIAGNOSIS — H579 Unspecified disorder of eye and adnexa: Secondary | ICD-10-CM

## 2014-10-14 DIAGNOSIS — E785 Hyperlipidemia, unspecified: Secondary | ICD-10-CM

## 2014-10-14 DIAGNOSIS — I10 Essential (primary) hypertension: Secondary | ICD-10-CM

## 2014-10-14 LAB — POCT UA - MICROSCOPIC ONLY
BACTERIA, U MICROSCOPIC: NEGATIVE
CRYSTALS, UR, HPF, POC: NEGATIVE
Casts, Ur, LPF, POC: NEGATIVE
Mucus, UA: NEGATIVE
RBC, urine, microscopic: NEGATIVE
WBC, Ur, HPF, POC: NEGATIVE
Yeast, UA: NEGATIVE

## 2014-10-14 LAB — POCT URINALYSIS DIPSTICK
BILIRUBIN UA: NEGATIVE
Blood, UA: NEGATIVE
Glucose, UA: NEGATIVE
Ketones, UA: NEGATIVE
Leukocytes, UA: NEGATIVE
Nitrite, UA: NEGATIVE
PH UA: 6
Protein, UA: NEGATIVE
SPEC GRAV UA: 1.01
Urobilinogen, UA: NEGATIVE

## 2014-10-14 MED ORDER — BUSPIRONE HCL 5 MG PO TABS: 5.0000 mg | ORAL_TABLET | Freq: Two times a day (BID) | ORAL | Status: DC

## 2014-10-14 NOTE — Progress Notes (Signed)
Subjective:    [name], female    DOB: 26-Sep-1946, 69 y.o.   MRN: [id number]  HPI Patient here today for 1 week follow up on hematuria and hypertension. The patient still has some dizziness occasionally with standing with the new medication. She brings in blood pressures for review and she has had 3 blood pressures in a row in the mornings that were very good. The evening blood pressures are running higher. Patient indicates that she stays anxious. She said she took Paxil in the past but had a terrible issue coming off of this medication. She uses an [employer] blood pressure monitor at home and her husband use a takes her blood pressure.     Patient Active Problem List   Diagnosis Date Noted  . Hypertension 01/11/2014  . Vertigo 01/11/2014  . Hyperlipidemia 01/11/2014   Outpatient Encounter Prescriptions as of 10/14/2014  Medication Sig  . amLODipine (NORVASC) 5 MG tablet Take 1 tablet (5 mg total) by mouth daily.  Marland Kitchen CINNAMON PO Take 1 capsule by mouth daily.  . COMBIGAN 0.2-0.5 % ophthalmic solution   . lisinopril (PRINIVIL,ZESTRIL) 40 MG tablet Take 1 tablet (40 mg total) by mouth daily.  Marland Kitchen loteprednol (LOTEMAX) 0.5 % ophthalmic suspension Place 1 drop into the right eye 4 (four) times daily.  . metroNIDAZOLE (METROCREAM) 0.75 % cream Apply topically 2 (two) times daily.  . Omega-3 Fatty Acids (FISH OIL PO) Take 1 capsule by mouth daily.  Marland Kitchen PROLENSA 0.07 % SOLN   . sulfamethoxazole-trimethoprim (BACTRIM DS,SEPTRA DS) 800-160 MG per tablet Take one twice daily with food  . TRAVATAN Z 0.004 % SOLN ophthalmic solution     Review of Systems  Constitutional: Negative.   HENT: Negative.   Eyes: Negative.   Respiratory: Negative.   Cardiovascular: Negative.   Gastrointestinal: Negative.   Endocrine: Negative.   Genitourinary: Negative.   Musculoskeletal: Negative.   Skin: Negative.   Allergic/Immunologic: Negative.   Neurological: Positive for dizziness  (occasionally with standing - since new meds). Negative for headaches.  Hematological: Negative.   Psychiatric/Behavioral: Negative.        Objective:   Physical Exam  Constitutional: She is oriented to person, place, and time. No distress.  The patient is thin and appears somewhat older than her stated age of 80.  HENT:  Head: Normocephalic and atraumatic.  Eyes: Conjunctivae and EOM are normal. Pupils are equal, round, and reactive to light. Right eye exhibits no discharge. Left eye exhibits no discharge. No scleral icterus.  Neck: Normal range of motion. Neck supple. No thyromegaly present.  There is no thyromegaly or anterior cervical nodes.  Cardiovascular: Normal rate, regular rhythm and normal heart sounds.   No murmur heard. Heart has a regular rate and rhythm at 72/m without murmur.  Pulmonary/Chest: Effort normal and breath sounds normal. No respiratory distress. She has no wheezes. She has no rales. She exhibits no tenderness.  Abdominal: Soft. She exhibits no mass. There is no tenderness. There is no rebound and no guarding.  There was no suprapubic tenderness.  Musculoskeletal: Normal range of motion. She exhibits no edema.  Lymphadenopathy:    She has no cervical adenopathy.  Neurological: She is alert and oriented to person, place, and time.  Skin: Skin is warm and dry. No rash noted.  Psychiatric: She has a normal mood and affect. Her behavior is normal. Judgment and thought content normal.  Nursing note and vitals reviewed.  BP 190/87 mmHg  Pulse 72  Temp(Src) 97 F (36.1 C) (Oral)  Ht 5\' 6"  (1.676 m)  Wt 112 lb (50.803 kg)  BMI 18.09 kg/m2  Several blood pressures were checked while the patient was in the office. I checked on the right side sitting it was 156/100. On the left side sitting was 180/100 with a regular cuff. We will get a urinalysis today to follow-up on the previous urinary tract infection.      Assessment & Plan:  1. Essential  hypertension -Change amlodipine to taking this at lunchtime -Continue to watch sodium intake - US Carotid Duplex Bilateral; Future  2. Eye pressure -Continue follow-up with ophthalmologist - US Carotid Duplex Bilateral; Future  3. Hematuria -We will call you with the results of the urinalysis once this is available, finish antibiotic that you're currently taking - POCT urinalysis dipstick - POCT UA - Microscopic Only  4. Anxiety state -Try BuSpar 5 mg twice daily as needed -We will see how this impacts the blood pressure  5. UTI (lower urinary tract infection) -Continue and complete antibiotic   Meds ordered this encounter  Medications  . busPIRone (BUSPAR) 5 MG tablet    Sig: Take 1 tablet (5 mg total) by mouth 2 (two) times daily.    Dispense:  60 tablet    Refill:  1     Patient Instructions  Bring a list of BP's and your BP _ CUFF - next week for jamie to see  We will see you in about 4 weeks to follow up  Try the Buspar up to twice a day as needed - we will see if this helps with you BP  Switch Amlodipine 5 mg to lunch time   Arrie Senate MD

## 2014-10-14 NOTE — Patient Instructions (Signed)
Bring a list of BP's and your BP _ CUFF - next week for Mckenzie [name] to see  We will see you in about 4 weeks to follow up  Try the Buspar up to twice a day as needed - we will see if this helps with you BP  Switch Amlodipine 5 mg to lunch time

## 2014-10-17 ENCOUNTER — Telehealth: Payer: Self-pay | Admitting: Family Medicine

## 2014-10-17 NOTE — Telephone Encounter (Signed)
Pt notified labs not back yet

## 2014-10-17 NOTE — Addendum Note (Signed)
Addended by: Pollyann Kennedy F on: 10/17/2014 04:55 PM   Modules accepted: Orders

## 2014-10-18 ENCOUNTER — Telehealth: Payer: Self-pay | Admitting: *Deleted

## 2014-10-18 LAB — LIPID PANEL
CHOL/HDL RATIO: 6 ratio — AB (ref 0.0–4.4)
CHOLESTEROL TOTAL: 265 mg/dL — AB (ref 100–199)
HDL: 44 mg/dL (ref >39)
LDL CALC: 195 mg/dL — AB (ref 0–99)
TRIGLYCERIDES: 129 mg/dL (ref 0–149)
VLDL Cholesterol Cal: 26 mg/dL (ref 5–40)

## 2014-10-18 LAB — CMP14+EGFR
ALT: 10 IU/L (ref 0–32)
AST: 11 IU/L (ref 0–40)
Albumin/Globulin Ratio: 1.6 (ref 1.1–2.5)
Albumin: 4.1 g/dL (ref 3.6–4.8)
Alkaline Phosphatase: 76 IU/L (ref 39–117)
BUN/Creatinine Ratio: 12 (ref 11–26)
BUN: 11 mg/dL (ref 8–27)
CALCIUM: 9.2 mg/dL (ref 8.7–10.3)
CHLORIDE: 100 mmol/L (ref 97–108)
CO2: 23 mmol/L (ref 18–29)
Creatinine, Ser: 0.92 mg/dL (ref 0.57–1.00)
GFR calc Af Amer: 73 mL/min/{1.73_m2} (ref >59)
GFR calc non Af Amer: 64 mL/min/{1.73_m2} (ref >59)
Globulin, Total: 2.5 g/dL (ref 1.5–4.5)
Glucose: 109 mg/dL — ABNORMAL HIGH (ref 65–99)
POTASSIUM: 5.1 mmol/L (ref 3.5–5.2)
Sodium: 135 mmol/L (ref 134–144)
TOTAL PROTEIN: 6.6 g/dL (ref 6.0–8.5)
Total Bilirubin: <0.2 mg/dL (ref 0.0–1.2)

## 2014-10-18 NOTE — Telephone Encounter (Signed)
-----   Message from Chipper Herb, MD sent at 10/18/2014  7:28 AM EST ----- The blood sugar slightly elevated at 109 and this is consistent with past readings. The creatinine the most important kidney function test is within normal limits. The electrolytes including potassium are good. The liver function tests are all within normal limits. A traditional lipid panel has a total cholesterol that is very elevated at 265. The triglycerides are good. The LDL C cholesterol, which is the bad cholesterol is also very elevated.------ please schedule this patient with the clinical pharmacist so that treatment and therapeutic lifestyle changes can be discussed as soon as possible.+++++++

## 2014-10-18 NOTE — Telephone Encounter (Signed)
Pt's husband notified of results Verbalizes understanding appt scheduled with clinical pharmacist

## 2014-10-24 ENCOUNTER — Other Ambulatory Visit: Payer: Self-pay | Admitting: *Deleted

## 2014-10-24 ENCOUNTER — Telehealth: Payer: Self-pay | Admitting: Family Medicine

## 2014-10-24 DIAGNOSIS — I10 Essential (primary) hypertension: Secondary | ICD-10-CM

## 2014-10-25 ENCOUNTER — Telehealth: Payer: Self-pay | Admitting: Family Medicine

## 2014-10-25 ENCOUNTER — Encounter (INDEPENDENT_AMBULATORY_CARE_PROVIDER_SITE_OTHER): Payer: Medicare HMO | Admitting: Ophthalmology

## 2014-10-25 DIAGNOSIS — I10 Essential (primary) hypertension: Secondary | ICD-10-CM

## 2014-10-25 DIAGNOSIS — H34831 Tributary (branch) retinal vein occlusion, right eye: Secondary | ICD-10-CM

## 2014-10-25 DIAGNOSIS — H35033 Hypertensive retinopathy, bilateral: Secondary | ICD-10-CM

## 2014-10-25 DIAGNOSIS — H3531 Nonexudative age-related macular degeneration: Secondary | ICD-10-CM

## 2014-10-25 NOTE — Telephone Encounter (Signed)
LM

## 2014-10-26 ENCOUNTER — Ambulatory Visit
Admission: RE | Admit: 2014-10-26 | Discharge: 2014-10-26 | Disposition: A | Discharge status: Home / Self Care | Payer: Medicare HMO | Source: Ambulatory Visit | Attending: Family Medicine | Admitting: Family Medicine

## 2014-10-26 DIAGNOSIS — I10 Essential (primary) hypertension: Secondary | ICD-10-CM

## 2014-10-26 DIAGNOSIS — H579 Unspecified disorder of eye and adnexa: Secondary | ICD-10-CM

## 2014-11-02 NOTE — Telephone Encounter (Signed)
Several attempts have been made to contact patient and No answer. Mailbox is full. Patient has appointment on 2/5.

## 2014-11-11 ENCOUNTER — Encounter: Payer: Self-pay | Admitting: Family Medicine

## 2014-11-11 ENCOUNTER — Ambulatory Visit (INDEPENDENT_AMBULATORY_CARE_PROVIDER_SITE_OTHER): Payer: Medicare HMO | Admitting: Family Medicine

## 2014-11-11 VITALS — BP 193/87 | HR 72 | Temp 97.0°F | Ht 66.0 in | Wt 112.0 lb

## 2014-11-11 DIAGNOSIS — I10 Essential (primary) hypertension: Secondary | ICD-10-CM

## 2014-11-11 DIAGNOSIS — E785 Hyperlipidemia, unspecified: Secondary | ICD-10-CM

## 2014-11-11 DIAGNOSIS — F411 Generalized anxiety disorder: Secondary | ICD-10-CM

## 2014-11-11 MED ORDER — LORAZEPAM 0.5 MG PO TABS: 0.5000 mg | ORAL_TABLET | Freq: Two times a day (BID) | ORAL | Status: DC

## 2014-11-11 NOTE — Progress Notes (Signed)
Subjective:    [name], female    DOB: May 09, 1946, 69 y.o.   MRN: [id number]  HPI Patient here today for 4 week follow up on HTN. She is scheduled to see Dr Lorrene Reid, Nephrology on Feb. 12, 2016. Sh brings in readings today from home. For the most part they are running fairly well in the mornings and they increase in the afternoons. The patient also indicates that she is nervous and anxious. She does not appear this way. The patient is also concerned about her cholesterol and she will restart her atorvastatin 40 and take one half daily.       Patient Active Problem List   Diagnosis Date Noted  . Hypertension 01/11/2014  . Vertigo 01/11/2014  . Hyperlipidemia 01/11/2014   Outpatient Encounter Prescriptions as of 11/11/2014  Medication Sig  . amLODipine (NORVASC) 5 MG tablet Take 1 tablet (5 mg total) by mouth daily.  Marland Kitchen CINNAMON PO Take 1 capsule by mouth daily.  . COMBIGAN 0.2-0.5 % ophthalmic solution   . lisinopril (PRINIVIL,ZESTRIL) 40 MG tablet Take 1 tablet (40 mg total) by mouth daily.  Marland Kitchen loteprednol (LOTEMAX) 0.5 % ophthalmic suspension Place 1 drop into the right eye 4 (four) times daily.  . metroNIDAZOLE (METROCREAM) 0.75 % cream Apply topically 2 (two) times daily.  . Omega-3 Fatty Acids (FISH OIL PO) Take 1 capsule by mouth daily.  Marland Kitchen PROLENSA 0.07 % SOLN   . TRAVATAN Z 0.004 % SOLN ophthalmic solution   . [DISCONTINUED] busPIRone (BUSPAR) 5 MG tablet Take 1 tablet (5 mg total) by mouth 2 (two) times daily.  . [DISCONTINUED] sulfamethoxazole-trimethoprim (BACTRIM DS,SEPTRA DS) 800-160 MG per tablet Take one twice daily with food     Review of Systems  Constitutional: Negative.   HENT: Negative.   Eyes: Negative.   Respiratory: Negative.   Cardiovascular: Negative.   Gastrointestinal: Negative.   Endocrine: Negative.   Genitourinary: Negative.   Musculoskeletal: Negative.   Skin: Negative.   Allergic/Immunologic: Negative.   Neurological:  Negative.   Hematological: Negative.   Psychiatric/Behavioral: The patient is nervous/anxious.        Objective:   Physical Exam  Constitutional: She is oriented to person, place, and time. No distress.  The patient appears calm but does look older than her stated age of 43.  HENT:  Head: Normocephalic and atraumatic.  Eyes: Conjunctivae and EOM are normal. Pupils are equal, round, and reactive to light. Right eye exhibits no discharge. Left eye exhibits no discharge. No scleral icterus.  Neck: Normal range of motion. Neck supple. No JVD present. No thyromegaly present.  No carotid bruits  Cardiovascular: Normal rate, regular rhythm, normal heart sounds and intact distal pulses.   No murmur heard. At 72/m  Pulmonary/Chest: Effort normal and breath sounds normal. No respiratory distress. She has no wheezes. She has no rales. She exhibits no tenderness.  Abdominal: Soft. Bowel sounds are normal. She exhibits no mass. There is no tenderness. There is no rebound and no guarding.  Musculoskeletal: Normal range of motion. She exhibits no edema.  Lymphadenopathy:    She has no cervical adenopathy.  Neurological: She is alert and oriented to person, place, and time.  Skin: Skin is warm and dry. No rash noted.  Psychiatric: She has a normal mood and affect. Her behavior is normal. Judgment and thought content normal.  Nursing note and vitals reviewed.  BP 193/87 mmHg  Pulse 72  Temp(Src) 97 F (36.1 C) (Oral)  Ht  5\' 6"  (1.676 m)  Wt 112 lb (50.803 kg)  BMI 18.09 kg/m2  Repeat blood pressure in right arm 178/112      Assessment & Plan:  1. Essential hypertension -Continue current medications -Reduce caffeine intake as much as possible -Continue to walk regularly -Try taking one half of a 0.5 lorazepam after lunch and see if this helps blood pressure readings -Continue to monitor blood pressure readings and take readings with you to see the nephrologist  2.  Hyperlipidemia -Continue exercise regimen -Restart atorvastatin 40 one half daily -Return to clinic in 3 months to recheck lipid panel  3. Anxiety state -Take lorazepam 0.5 one half daily twice a day as needed but one half daily until visit with nephrologist in 1 week   Meds ordered this encounter  Medications  . LORazepam (ATIVAN) 0.5 MG tablet    Sig: Take 1 tablet (0.5 mg total) by mouth 2 (two) times daily. As directed    Dispense:  60 tablet    Refill:  1   Patient Instructions  Follow-up with nephrology appointment as planned Try taking the lorazepam 0.5 one half tablet after lunch daily at least for the next week to see if it has any impact on blood pressure readings Reduce caffeine as much as possible Continue exercise regimen   Arrie Senate MD

## 2014-11-11 NOTE — Patient Instructions (Signed)
Follow-up with nephrology appointment as planned Try taking the lorazepam 0.5 one half tablet after lunch daily at least for the next week to see if it has any impact on blood pressure readings Reduce caffeine as much as possible Continue exercise regimen

## 2014-11-22 ENCOUNTER — Encounter (INDEPENDENT_AMBULATORY_CARE_PROVIDER_SITE_OTHER): Payer: Medicare HMO | Admitting: Ophthalmology

## 2014-11-23 ENCOUNTER — Encounter (INDEPENDENT_AMBULATORY_CARE_PROVIDER_SITE_OTHER): Payer: Medicare HMO | Admitting: Ophthalmology

## 2014-11-23 ENCOUNTER — Other Ambulatory Visit (HOSPITAL_COMMUNITY): Payer: Self-pay | Admitting: Nephrology

## 2014-11-23 DIAGNOSIS — H35033 Hypertensive retinopathy, bilateral: Secondary | ICD-10-CM

## 2014-11-23 DIAGNOSIS — I10 Essential (primary) hypertension: Secondary | ICD-10-CM

## 2014-11-23 DIAGNOSIS — H3531 Nonexudative age-related macular degeneration: Secondary | ICD-10-CM

## 2014-11-23 DIAGNOSIS — H34831 Tributary (branch) retinal vein occlusion, right eye: Secondary | ICD-10-CM

## 2014-12-05 ENCOUNTER — Ambulatory Visit (HOSPITAL_COMMUNITY)
Admission: RE | Admit: 2014-12-05 | Discharge: 2014-12-05 | Disposition: A | Discharge status: Home / Self Care | Payer: Medicare HMO | Source: Ambulatory Visit | Attending: Nephrology | Admitting: Nephrology

## 2014-12-05 DIAGNOSIS — I10 Essential (primary) hypertension: Secondary | ICD-10-CM

## 2014-12-05 NOTE — Progress Notes (Signed)
VASCULAR LAB PRELIMINARY  PRELIMINARY  PRELIMINARY  PRELIMINARY  Renal Artery Duplex Bilateral completed.    Preliminary report:  Right renal/ Aorta ratio:  1.3                                 Left renal / Aorta ratio:  1.2                                 No significant renal arterial stenosis bilaterally.  August Albino, RVT 12/05/2014, 11:09 AM

## 2014-12-20 ENCOUNTER — Encounter (INDEPENDENT_AMBULATORY_CARE_PROVIDER_SITE_OTHER): Payer: Medicare HMO | Admitting: Ophthalmology

## 2014-12-20 DIAGNOSIS — I10 Essential (primary) hypertension: Secondary | ICD-10-CM

## 2014-12-20 DIAGNOSIS — H35343 Macular cyst, hole, or pseudohole, bilateral: Secondary | ICD-10-CM

## 2014-12-20 DIAGNOSIS — H35033 Hypertensive retinopathy, bilateral: Secondary | ICD-10-CM | POA: Diagnosis not present

## 2014-12-20 DIAGNOSIS — H34831 Tributary (branch) retinal vein occlusion, right eye: Secondary | ICD-10-CM | POA: Diagnosis not present

## 2015-01-17 ENCOUNTER — Encounter (INDEPENDENT_AMBULATORY_CARE_PROVIDER_SITE_OTHER): Payer: Medicare HMO | Admitting: Ophthalmology

## 2015-01-31 ENCOUNTER — Encounter (INDEPENDENT_AMBULATORY_CARE_PROVIDER_SITE_OTHER): Payer: Medicare HMO | Admitting: Ophthalmology

## 2015-01-31 DIAGNOSIS — I10 Essential (primary) hypertension: Secondary | ICD-10-CM | POA: Diagnosis not present

## 2015-01-31 DIAGNOSIS — H35343 Macular cyst, hole, or pseudohole, bilateral: Secondary | ICD-10-CM | POA: Diagnosis not present

## 2015-01-31 DIAGNOSIS — H3531 Nonexudative age-related macular degeneration: Secondary | ICD-10-CM | POA: Diagnosis not present

## 2015-01-31 DIAGNOSIS — H35033 Hypertensive retinopathy, bilateral: Secondary | ICD-10-CM | POA: Diagnosis not present

## 2015-01-31 DIAGNOSIS — H34831 Tributary (branch) retinal vein occlusion, right eye: Secondary | ICD-10-CM | POA: Diagnosis not present

## 2015-02-09 ENCOUNTER — Encounter: Payer: Self-pay | Admitting: Family Medicine

## 2015-02-09 ENCOUNTER — Ambulatory Visit (INDEPENDENT_AMBULATORY_CARE_PROVIDER_SITE_OTHER): Payer: Medicare HMO | Admitting: Family Medicine

## 2015-02-09 VITALS — BP 208/96 | HR 70 | Temp 97.1°F | Ht 66.0 in | Wt 108.0 lb

## 2015-02-09 DIAGNOSIS — E785 Hyperlipidemia, unspecified: Secondary | ICD-10-CM

## 2015-02-09 DIAGNOSIS — I10 Essential (primary) hypertension: Secondary | ICD-10-CM

## 2015-02-09 LAB — POCT CBC
GRANULOCYTE PERCENT: 59.6 % (ref 37–80)
HEMATOCRIT: 44.5 % (ref 37.7–47.9)
HEMOGLOBIN: 14.4 g/dL (ref 12.2–16.2)
Lymph, poc: 1.8 (ref 0.6–3.4)
MCH, POC: 28.2 pg (ref 27–31.2)
MCHC: 32.3 g/dL (ref 31.8–35.4)
MCV: 87.2 fL (ref 80–97)
MPV: 8.5 fL (ref 0–99.8)
POC GRANULOCYTE: 3.2 (ref 2–6.9)
POC LYMPH %: 34.5 % (ref 10–50)
Platelet Count, POC: 231 10*3/uL (ref 142–424)
RBC: 5.1 M/uL (ref 4.04–5.48)
RDW, POC: 13.1 %
WBC: 5.3 10*3/uL (ref 4.6–10.2)

## 2015-02-09 NOTE — Progress Notes (Signed)
Subjective:    [name], female    DOB: 1945/11/29, 69 y.o.   MRN: [id number]  HPI   69 year old female comes in today to follow up on chronic medical conditions that include hypertension and hyperlipidemia. She has was released by Dr Lorrene Reid who evaluated to see if her kidneys played a role in her hypertension. She changed her medications and the patient has readings with her today. She is basically taking the same amount of medicine but is splitting the dose to 2 doses daily morning and evening and she leaves one of the pills off for systolic blood pressures less than 130. The patient brings in blood pressures for review today.  Patient Active Problem List   Diagnosis Date Noted  . Hypertension 01/11/2014  . Vertigo 01/11/2014  . Hyperlipidemia 01/11/2014   Outpatient Encounter Prescriptions as of 02/09/2015  Medication Sig  . amLODipine (NORVASC) 2.5 MG tablet Take 1 tablet by mouth 2 (two) times daily.  Marland Kitchen atorvastatin (LIPITOR) 40 MG tablet Take 20 mg by mouth daily.  Marland Kitchen CINNAMON PO Take 1 capsule by mouth daily.  . COMBIGAN 0.2-0.5 % ophthalmic solution   . lisinopril (PRINIVIL,ZESTRIL) 20 MG tablet Take 1 tablet by mouth 2 (two) times daily.  Marland Kitchen loteprednol (LOTEMAX) 0.5 % ophthalmic suspension Place 1 drop into the right eye 4 (four) times daily.  . metroNIDAZOLE (METROCREAM) 0.75 % cream Apply topically 2 (two) times daily.  . Omega-3 Fatty Acids (FISH OIL PO) Take 1 capsule by mouth daily.  Marland Kitchen PROLENSA 0.07 % SOLN   . TRAVATAN Z 0.004 % SOLN ophthalmic solution   . [DISCONTINUED] amLODipine (NORVASC) 5 MG tablet Take 1 tablet (5 mg total) by mouth daily.  . [DISCONTINUED] lisinopril (PRINIVIL,ZESTRIL) 40 MG tablet Take 1 tablet (40 mg total) by mouth daily.  . [DISCONTINUED] LORazepam (ATIVAN) 0.5 MG tablet Take 1 tablet (0.5 mg total) by mouth 2 (two) times daily. As directed   No facility-administered encounter medications on file as of 02/09/2015.       Review of Systems  All other systems reviewed and are negative.      Objective:   Physical Exam  Constitutional: She is oriented to person, place, and time. She appears well-developed and well-nourished. No distress.  HENT:  Head: Normocephalic and atraumatic.  Eyes: Conjunctivae and EOM are normal. Pupils are equal, round, and reactive to light. Right eye exhibits no discharge. Left eye exhibits no discharge. No scleral icterus.  Neck: Normal range of motion. Neck supple. No thyromegaly present.  Cardiovascular: Normal rate, regular rhythm, normal heart sounds and intact distal pulses.   No murmur heard. At 72/m  Pulmonary/Chest: Effort normal and breath sounds normal. No respiratory distress. She has no wheezes. She has no rales. She exhibits no tenderness.  Abdominal: Soft. Bowel sounds are normal. She exhibits no mass. There is no tenderness. There is no rebound and no guarding.  Musculoskeletal: Normal range of motion. She exhibits no edema or tenderness.  Lymphadenopathy:    She has no cervical adenopathy.  Neurological: She is alert and oriented to person, place, and time. She has normal reflexes. No cranial nerve deficit.  Skin: Skin is warm and dry. No rash noted.  Psychiatric: She has a normal mood and affect. Her behavior is normal. Judgment and thought content normal.  Nursing note and vitals reviewed.   BP 208/96 mmHg  Pulse 70  Temp(Src) 97.1 F (36.2 C) (Oral)  Ht 5\' 6"  (1.676 m)  Wt 108 lb (48.988 kg)  BMI 17.44 kg/m2       Assessment & Plan:  1. Essential hypertension -Home blood pressures are good and the nephrologist has also review these and wants her to continue with the medication as she is doing. She will continue to watch her sodium intake and get her regular exercise. She will check her blood pressures periodically before taking her medicine and 2 hours after taking her medicine and will record several readings a month. She will bring these to  the next visit.  2. Hyperlipidemia She is currently taking atorvastatin 20 mg a day and we will recheck her lipids again today to make sure that everything is under good control with this dosage. She will continue to follow-up aggressive therapeutic lifestyle changes  She will have today a CBC, BMP, LFTs and a traditional lipid panel.  Patient Instructions  The patient will continue to walk and exercise regularly She will continue to watch her diet regularly She will monitor blood pressures as directed checking them before taking her medicine in a couple of hours later and she will do these in the mornings occasionally and in the evenings occasionally She will continue to watch her sodium intake For the time being she will stay on atorvastatin 20 mg daily until lab work is returned   Arrie Senate MD

## 2015-02-09 NOTE — Patient Instructions (Addendum)
The patient will continue to walk and exercise regularly She will continue to watch her diet regularly She will monitor blood pressures as directed checking them before taking her medicine in a couple of hours later and she will do these in the mornings occasionally and in the evenings occasionally She will continue to watch her sodium intake For the time being she will stay on atorvastatin 20 mg daily until lab work is returned She should try to return the FOBT if possible

## 2015-02-10 LAB — BMP8+EGFR
BUN / CREAT RATIO: 13 (ref 11–26)
BUN: 8 mg/dL (ref 8–27)
CO2: 24 mmol/L (ref 18–29)
Calcium: 10 mg/dL (ref 8.7–10.3)
Chloride: 102 mmol/L (ref 97–108)
Creatinine, Ser: 0.64 mg/dL (ref 0.57–1.00)
GFR calc Af Amer: 105 mL/min/{1.73_m2} (ref >59)
GFR calc non Af Amer: 91 mL/min/{1.73_m2} (ref >59)
Glucose: 102 mg/dL — ABNORMAL HIGH (ref 65–99)
Potassium: 4.4 mmol/L (ref 3.5–5.2)
SODIUM: 143 mmol/L (ref 134–144)

## 2015-02-10 LAB — LIPID PANEL
CHOLESTEROL TOTAL: 159 mg/dL (ref 100–199)
Chol/HDL Ratio: 3.3 ratio units (ref 0.0–4.4)
HDL: 48 mg/dL (ref >39)
LDL CALC: 85 mg/dL (ref 0–99)
Triglycerides: 128 mg/dL (ref 0–149)
VLDL CHOLESTEROL CAL: 26 mg/dL (ref 5–40)

## 2015-02-10 LAB — HEPATIC FUNCTION PANEL
ALT: 26 IU/L (ref 0–32)
AST: 20 IU/L (ref 0–40)
Albumin: 4.9 g/dL — ABNORMAL HIGH (ref 3.6–4.8)
Alkaline Phosphatase: 102 IU/L (ref 39–117)
Bilirubin Total: 0.5 mg/dL (ref 0.0–1.2)
Bilirubin, Direct: 0.09 mg/dL (ref 0.00–0.40)
Total Protein: 7.2 g/dL (ref 6.0–8.5)

## 2015-02-27 ENCOUNTER — Encounter (INDEPENDENT_AMBULATORY_CARE_PROVIDER_SITE_OTHER): Payer: Medicare HMO | Admitting: Ophthalmology

## 2015-02-27 DIAGNOSIS — H35033 Hypertensive retinopathy, bilateral: Secondary | ICD-10-CM | POA: Diagnosis not present

## 2015-02-27 DIAGNOSIS — H34831 Tributary (branch) retinal vein occlusion, right eye: Secondary | ICD-10-CM | POA: Diagnosis not present

## 2015-02-27 DIAGNOSIS — I10 Essential (primary) hypertension: Secondary | ICD-10-CM | POA: Diagnosis not present

## 2015-02-28 ENCOUNTER — Encounter (INDEPENDENT_AMBULATORY_CARE_PROVIDER_SITE_OTHER): Payer: Medicare HMO | Admitting: Ophthalmology

## 2015-03-07 ENCOUNTER — Encounter: Payer: Self-pay | Admitting: *Deleted

## 2015-03-28 ENCOUNTER — Encounter (INDEPENDENT_AMBULATORY_CARE_PROVIDER_SITE_OTHER): Payer: Medicare HMO | Admitting: Ophthalmology

## 2015-03-28 DIAGNOSIS — H34831 Tributary (branch) retinal vein occlusion, right eye: Secondary | ICD-10-CM

## 2015-03-28 DIAGNOSIS — I10 Essential (primary) hypertension: Secondary | ICD-10-CM

## 2015-03-28 DIAGNOSIS — H35033 Hypertensive retinopathy, bilateral: Secondary | ICD-10-CM | POA: Diagnosis not present

## 2015-03-28 DIAGNOSIS — H35343 Macular cyst, hole, or pseudohole, bilateral: Secondary | ICD-10-CM | POA: Diagnosis not present

## 2015-04-25 ENCOUNTER — Encounter (INDEPENDENT_AMBULATORY_CARE_PROVIDER_SITE_OTHER): Payer: Medicare HMO | Admitting: Ophthalmology

## 2015-04-25 DIAGNOSIS — H3531 Nonexudative age-related macular degeneration: Secondary | ICD-10-CM

## 2015-04-25 DIAGNOSIS — H35033 Hypertensive retinopathy, bilateral: Secondary | ICD-10-CM

## 2015-04-25 DIAGNOSIS — H34831 Tributary (branch) retinal vein occlusion, right eye: Secondary | ICD-10-CM | POA: Diagnosis not present

## 2015-04-25 DIAGNOSIS — I10 Essential (primary) hypertension: Secondary | ICD-10-CM | POA: Diagnosis not present

## 2015-04-28 ENCOUNTER — Other Ambulatory Visit: Payer: Self-pay | Admitting: *Deleted

## 2015-04-28 MED ORDER — LISINOPRIL 20 MG PO TABS: 20.0000 mg | ORAL_TABLET | Freq: Two times a day (BID) | ORAL | Status: DC

## 2015-05-09 ENCOUNTER — Other Ambulatory Visit: Payer: Self-pay | Admitting: *Deleted

## 2015-05-09 MED ORDER — ATORVASTATIN CALCIUM 20 MG PO TABS: 20.0000 mg | ORAL_TABLET | Freq: Every day | ORAL | Status: DC

## 2015-05-13 ENCOUNTER — Encounter: Payer: Self-pay | Admitting: Nurse Practitioner

## 2015-05-13 ENCOUNTER — Ambulatory Visit (INDEPENDENT_AMBULATORY_CARE_PROVIDER_SITE_OTHER): Payer: Medicare HMO | Admitting: Nurse Practitioner

## 2015-05-13 VITALS — BP 146/74 | HR 75 | Temp 97.5°F | Ht 66.0 in | Wt 110.0 lb

## 2015-05-13 DIAGNOSIS — R300 Dysuria: Secondary | ICD-10-CM

## 2015-05-13 LAB — POCT URINALYSIS DIPSTICK
BILIRUBIN UA: NEGATIVE
Blood, UA: NEGATIVE
Glucose, UA: NEGATIVE
Ketones, UA: NEGATIVE
LEUKOCYTES UA: NEGATIVE
Nitrite, UA: NEGATIVE
PROTEIN UA: NEGATIVE
Spec Grav, UA: <=1.005
Urobilinogen, UA: NEGATIVE
pH, UA: 7

## 2015-05-13 LAB — POCT UA - MICROSCOPIC ONLY
CRYSTALS, UR, HPF, POC: NEGATIVE
Casts, Ur, LPF, POC: NEGATIVE
Mucus, UA: NEGATIVE
Yeast, UA: NEGATIVE

## 2015-05-13 MED ORDER — NITROFURANTOIN MONOHYD MACRO 100 MG PO CAPS
100.0000 mg | ORAL_CAPSULE | Freq: Two times a day (BID) | ORAL | Status: DC
Start: 2015-05-13 — End: 2015-07-11

## 2015-05-13 NOTE — Patient Instructions (Signed)

## 2015-05-13 NOTE — Progress Notes (Signed)
  Subjective:    Mckenzie [name] is a 69 y.o. female who complains of abnormal smelling urine, burning with urination, dysuria, frequency and urgency. She has had symptoms for 3 days. Patient also complains of no other symptoms. Patient denies back pain. Patient does have a history of recurrent UTI. Patient does not have a history of pyelonephritis.   * Macrobid was given to her by her sister and she has taken 2- has really helped  The following portions of the patient's history were reviewed and updated as appropriate: allergies, current medications, past family history, past medical history, past social history, past surgical history and problem list.  Review of Systems Pertinent items are noted in HPI.    Objective:    BP 146/74 mmHg  Pulse 75  Temp(Src) 97.5 F (36.4 C) (Oral)  Ht 5\' 6"  (1.676 m)  Wt 110 lb (49.896 kg)  BMI 17.76 kg/m2 General appearance: alert and cooperative Back: symmetric, no curvature. ROM normal. No CVA tenderness. Lungs: clear to auscultation bilaterally Heart: regular rate and rhythm, S1, S2 normal, no murmur, click, rub or gallop Abdomen: soft, non-tender; bowel sounds normal; no masses,  no organomegaly  Laboratory:    Results for orders placed or performed in visit on 05/13/15  POCT urinalysis dipstick  Result Value Ref Range   Color, UA yellow    Clarity, UA clr    Glucose, UA neg    Bilirubin, UA neg    Ketones, UA neg    Spec Grav, UA <=1.005    Blood, UA neg    pH, UA 7.0    Protein, UA neg    Urobilinogen, UA negative    Nitrite, UA neg    Leukocytes, UA Negative Negative  POCT UA - Microscopic Only  Result Value Ref Range   WBC, Ur, HPF, POC occ    RBC, urine, microscopic occ    Bacteria, U Microscopic occ    Mucus, UA neg    Epithelial cells, urine per micros few    Crystals, Ur, HPF, POC neg    Casts, Ur, LPF, POC neg    Yeast, UA neg       Assessment:    Acute cystitis and UTI     Plan:   Meds ordered this  encounter  Medications  . nitrofurantoin, macrocrystal-monohydrate, (MACROBID) 100 MG capsule    Sig: Take 1 capsule (100 mg total) by mouth 2 (two) times daily. 1 po BId    Dispense:  14 capsule    Refill:  0    Order Specific Question:  Supervising Provider    Answer:  Chipper Herb [[id number]]   Take medication as prescribe Cotton underwear Take shower not bath Cranberry juice, yogurt Force fluids AZO over the counter X2 days Culture pending RTO prn  Mary-Margaret Hassell Done, FNP

## 2015-05-24 ENCOUNTER — Other Ambulatory Visit: Payer: Self-pay

## 2015-05-24 MED ORDER — AMLODIPINE BESYLATE 2.5 MG PO TABS: 2.5000 mg | ORAL_TABLET | Freq: Two times a day (BID) | ORAL | Status: DC

## 2015-06-06 ENCOUNTER — Encounter (INDEPENDENT_AMBULATORY_CARE_PROVIDER_SITE_OTHER): Payer: Medicare HMO | Admitting: Ophthalmology

## 2015-06-06 DIAGNOSIS — H34831 Tributary (branch) retinal vein occlusion, right eye: Secondary | ICD-10-CM

## 2015-06-06 DIAGNOSIS — H35033 Hypertensive retinopathy, bilateral: Secondary | ICD-10-CM

## 2015-06-06 DIAGNOSIS — I10 Essential (primary) hypertension: Secondary | ICD-10-CM

## 2015-06-06 DIAGNOSIS — H3531 Nonexudative age-related macular degeneration: Secondary | ICD-10-CM

## 2015-06-21 ENCOUNTER — Ambulatory Visit: Payer: Medicare HMO | Admitting: Family Medicine

## 2015-06-22 ENCOUNTER — Ambulatory Visit: Payer: Medicare HMO | Admitting: Family Medicine

## 2015-07-10 DIAGNOSIS — Z961 Presence of intraocular lens: Secondary | ICD-10-CM | POA: Diagnosis not present

## 2015-07-10 DIAGNOSIS — H40053 Ocular hypertension, bilateral: Secondary | ICD-10-CM | POA: Diagnosis not present

## 2015-07-10 DIAGNOSIS — H35353 Cystoid macular degeneration, bilateral: Secondary | ICD-10-CM | POA: Diagnosis not present

## 2015-07-10 DIAGNOSIS — H4063X1 Glaucoma secondary to drugs, bilateral, mild stage: Secondary | ICD-10-CM | POA: Diagnosis not present

## 2015-07-11 ENCOUNTER — Ambulatory Visit (INDEPENDENT_AMBULATORY_CARE_PROVIDER_SITE_OTHER): Payer: Medicare HMO | Admitting: Family Medicine

## 2015-07-11 ENCOUNTER — Ambulatory Visit (INDEPENDENT_AMBULATORY_CARE_PROVIDER_SITE_OTHER): Payer: Medicare HMO

## 2015-07-11 ENCOUNTER — Encounter: Payer: Self-pay | Admitting: Family Medicine

## 2015-07-11 VITALS — BP 159/80 | HR 60 | Temp 97.0°F | Ht 66.0 in | Wt 106.0 lb

## 2015-07-11 DIAGNOSIS — I10 Essential (primary) hypertension: Secondary | ICD-10-CM | POA: Diagnosis not present

## 2015-07-11 DIAGNOSIS — E785 Hyperlipidemia, unspecified: Secondary | ICD-10-CM

## 2015-07-11 DIAGNOSIS — J301 Allergic rhinitis due to pollen: Secondary | ICD-10-CM

## 2015-07-11 DIAGNOSIS — Z0000 Encounter for general adult medical examination without abnormal findings: Secondary | ICD-10-CM | POA: Diagnosis not present

## 2015-07-11 MED ORDER — ATORVASTATIN CALCIUM 20 MG PO TABS: 20.0000 mg | ORAL_TABLET | Freq: Every day | ORAL | Status: DC

## 2015-07-11 MED ORDER — AZITHROMYCIN 250 MG PO TABS: ORAL_TABLET | ORAL | Status: DC

## 2015-07-11 MED ORDER — LISINOPRIL 20 MG PO TABS: 20.0000 mg | ORAL_TABLET | Freq: Two times a day (BID) | ORAL | Status: DC

## 2015-07-11 NOTE — Progress Notes (Signed)
Subjective:    [name], female    DOB: 1945/12/23, 69 y.o.   MRN: [id number]  HPI Pt here for follow up and management of chronic medical problems which includes hypertension and hyperlipidemia. She is taking medications regularly. The patient is doing well and has no specific complaints today other than some sinus congestion and pressure. She is requesting refills on her Lipitor and lisinopril. She is unable to take flu shots and refuses to take the Prevnar vaccine. She will get a chest x-ray because she is past due on this and she will get lab work done today. The patient always has problems with her blood pressure in the office and she has seen a nephrologist about this in the past and she does control her blood pressure well with home readings. The blood pressure at home today was 89/55. The patient denies chest pain shortness of breath trouble swallowing and heartburn indigestion nausea vomiting and diarrhea or blood in the stool. She also is passing her water without problems. She's had increase head congestion and drainage with no fever and no sore throat. She's had minimal cough. She has her FOBT at home which she has not returned. She brings home blood pressure readings in for review and these are quite frankly all over the place some very high some very low and some in the middle. She has seen a nephrologist in the past and no further adjustments will be made with her blood pressure medications.      Patient Active Problem List   Diagnosis Date Noted  . Hypertension 01/11/2014  . Vertigo 01/11/2014  . Hyperlipidemia 01/11/2014   Outpatient Encounter Prescriptions as of 07/11/2015  Medication Sig  . amLODipine (NORVASC) 2.5 MG tablet Take 1 tablet (2.5 mg total) by mouth 2 (two) times daily.  Marland Kitchen atorvastatin (LIPITOR) 20 MG tablet Take 1 tablet (20 mg total) by mouth daily.  Marland Kitchen CINNAMON PO Take 1 capsule by mouth daily.  . COMBIGAN 0.2-0.5 % ophthalmic solution   .  lisinopril (PRINIVIL,ZESTRIL) 20 MG tablet Take 1 tablet (20 mg total) by mouth 2 (two) times daily.  Marland Kitchen loteprednol (LOTEMAX) 0.5 % ophthalmic suspension Place 1 drop into the right eye 4 (four) times daily.  . metroNIDAZOLE (METROCREAM) 0.75 % cream Apply topically 2 (two) times daily.  . Omega-3 Fatty Acids (FISH OIL PO) Take 1 capsule by mouth daily.  Marland Kitchen PROLENSA 0.07 % SOLN   . TRAVATAN Z 0.004 % SOLN ophthalmic solution   . [DISCONTINUED] nitrofurantoin, macrocrystal-monohydrate, (MACROBID) 100 MG capsule Take 1 capsule (100 mg total) by mouth 2 (two) times daily. 1 po BId   No facility-administered encounter medications on file as of 07/11/2015.     Review of Systems  Constitutional: Negative.   HENT: Positive for congestion and sinus pressure.   Eyes: Negative.   Respiratory: Negative.   Cardiovascular: Negative.   Gastrointestinal: Negative.   Endocrine: Negative.   Genitourinary: Negative.   Musculoskeletal: Negative.   Skin: Negative.   Allergic/Immunologic: Negative.   Neurological: Negative.   Hematological: Negative.   Psychiatric/Behavioral: Negative.        Objective:   Physical Exam  Constitutional: She is oriented to person, place, and time. No distress.  Pleasant and alert and somewhat kyphotic posture.  HENT:  Head: Normocephalic.  Right Ear: External ear normal.  Left Ear: External ear normal.  Mouth/Throat: Oropharynx is clear and moist.  There is nasal congestion bilaterally and turbinate swelling. There is no  redness in the throat.  Eyes: Conjunctivae and EOM are normal. Pupils are equal, round, and reactive to light. Right eye exhibits no discharge. Left eye exhibits no discharge. No scleral icterus.  Neck: Normal range of motion. Neck supple. No thyromegaly present.  No anterior cervical adenopathy.  Cardiovascular: Normal rate, regular rhythm, normal heart sounds and intact distal pulses.   No murmur heard. The heart had a regular rate and rhythm at  60/m  Pulmonary/Chest: Effort normal and breath sounds normal. No respiratory distress. She has no wheezes. She has no rales. She exhibits no tenderness.  Clear anteriorly and posteriorly  Abdominal: Soft. Bowel sounds are normal. She exhibits no mass. There is no tenderness. There is no rebound and no guarding.  The abdomen was nontender to palpation without organ enlargement masses or bruits  Musculoskeletal: Normal range of motion. She exhibits no edema or tenderness.  Lymphadenopathy:    She has no cervical adenopathy.  Neurological: She is alert and oriented to person, place, and time. She has normal reflexes. No cranial nerve deficit.  Skin: Skin is warm and dry. No rash noted.  Psychiatric: She has a normal mood and affect. Her behavior is normal. Judgment and thought content normal.  Nursing note and vitals reviewed.  BP 159/80 mmHg  Pulse 60  Temp(Src) 97 F (36.1 C) (Oral)  Ht _0  (1.676 m)  Wt 106 lb (48.081 kg)  BMI 17.12 kg/m2        Assessment & Plan:  1. Essential hypertension -This patient most likely has whitecoat hypertension. She brings in blood pressures for review and they'll be scanned into the record. No change in treatment will be done today. She has highs and lows in readings in the middle. - BMP8+EGFR - CBC with Differential/Platelet - Hepatic function panel - DG Chest 2 View; Future - Lipid panel  2. Hyperlipidemia -She will continue to take her atorvastatin and omega-3 fatty acids along with watching her diet pending results of lab work - CBC with Differential/Platelet - DG Chest 2 View; Future - Lipid panel  3. Health care maintenance -The patient today refuses to take a Prevnar vaccine a flu shot because she is allergic to it and she also refuses DEXA scans and pelvic exams. - CBC with Differential/Platelet - Vit D  25 hydroxy (rtn osteoporosis monitoring) - Lipid panel  4. Allergic rhinitis due to pollen -She will use Mucinex maximum  strength, 1 twice daily for cough and congestion, she will use Flonase 1-2 sprays each nostril at bedtime, she'll also use nasal saline 1-2 sprays each nostril 3 or 4 times daily.  Meds ordered this encounter  Medications  . atorvastatin (LIPITOR) 20 MG tablet    Sig: Take 1 tablet (20 mg total) by mouth daily.    Dispense:  90 tablet    Refill:  3  . lisinopril (PRINIVIL,ZESTRIL) 20 MG tablet    Sig: Take 1 tablet (20 mg total) by mouth 2 (two) times daily.    Dispense:  180 tablet    Refill:  3   Patient Instructions                       Medicare Annual Wellness Visit  [facility] and the medical providers at [facility] strive to bring you the best medical care.  In doing so we not only want to address your current medical conditions and concerns but also to detect new conditions early and prevent illness,  disease and health-related problems.    Medicare offers a yearly Wellness Visit which allows our clinical staff to assess your need for preventative services including immunizations, lifestyle education, counseling to decrease risk of preventable diseases and screening for fall risk and other medical concerns.    This visit is provided free of charge (no copay) for all Medicare recipients. The clinical pharmacists at [facility] have begun to conduct these Wellness Visits which will also include a thorough review of all your medications.    As you primary medical provider recommend that you make an appointment for your Annual Wellness Visit if you have not done so already this year.  You may set up this appointment before you leave today or you may call back ([phone]) and schedule an appointment.  Please make sure when you call that you mention that you are scheduling your Annual Wellness Visit with the clinical pharmacist so that the appointment may be made for the proper length of time.    Continue current medications. Continue good  therapeutic lifestyle changes which include good diet and exercise. Fall precautions discussed with patient. If an FOBT was given today- please return it to our front desk. If you are over 70 years old - you may need Prevnar 57 or the adult Pneumonia vaccine.  **Flu shots will be available soon--- please call and schedule a FLU-CLINIC appointment**  After your visit with [place] today you will receive a survey in the mail or online from [employer] regarding your care with [place]. Please take a moment to fill this out. Your feedback is very important to [place] as you can help [place] better understand your patient needs as well as improve your experience and satisfaction. WE CARE ABOUT YOU!!!    We will call the patient with her lab work as soon as it becomes available She should continue to monitor her blood pressures at home and bring these readings to each visit She should continue her current treatment She should return the FOBT when possible She should use nasal saline, Mucinex maximum strength, blue and white in color one twice daily with a large glass of water for cough and congestion and she should start Flonase 1-2 sprays each nostril as directed on the paper given to her today. This will help the allergic rhinitis. If she develops any fever or yellow drainage or cough she should get back in touch with [place] as she may need an antibiotic   Arrie Senate MD

## 2015-07-11 NOTE — Patient Instructions (Addendum)
Medicare Annual Wellness Visit  [facility] and the medical providers at [facility] strive to bring you the best medical care.  In doing so we not only want to address your current medical conditions and concerns but also to detect new conditions early and prevent illness, disease and health-related problems.    Medicare offers a yearly Wellness Visit which allows our clinical staff to assess your need for preventative services including immunizations, lifestyle education, counseling to decrease risk of preventable diseases and screening for fall risk and other medical concerns.    This visit is provided free of charge (no copay) for all Medicare recipients. The clinical pharmacists at [facility] have begun to conduct these Wellness Visits which will also include a thorough review of all your medications.    As you primary medical provider recommend that you make an appointment for your Annual Wellness Visit if you have not done so already this year.  You may set up this appointment before you leave today or you may call back [phone]) and schedule an appointment.  Please make sure when you call that you mention that you are scheduling your Annual Wellness Visit with the clinical pharmacist so that the appointment may be made for the proper length of time.    Continue current medications. Continue good therapeutic lifestyle changes which include good diet and exercise. Fall precautions discussed with patient. If an FOBT was given today- please return it to our front desk. If you are over 4 years old - you may need Prevnar 20 or the adult Pneumonia vaccine.  **Flu shots will be available soon--- please call and schedule a FLU-CLINIC appointment**  After your visit with [place] today you will receive a survey in the mail or online from [employer] regarding your care with [place]. Please take a moment to fill this out. Your feedback is very  important to [place] as you can help [place] better understand your patient needs as well as improve your experience and satisfaction. WE CARE ABOUT YOU!!!    We will call the patient with her lab work as soon as it becomes available She should continue to monitor her blood pressures at home and bring these readings to each visit She should continue her current treatment She should return the FOBT when possible She should use nasal saline, Mucinex maximum strength, blue and white in color one twice daily with a large glass of water for cough and congestion and she should start Flonase 1-2 sprays each nostril as directed on the paper given to her today. This will help the allergic rhinitis. If she develops any fever or yellow drainage or cough she should get back in touch with [place] as she may need an antibiotic

## 2015-07-12 ENCOUNTER — Telehealth: Payer: Self-pay | Admitting: Family Medicine

## 2015-07-12 ENCOUNTER — Encounter (INDEPENDENT_AMBULATORY_CARE_PROVIDER_SITE_OTHER): Payer: Medicare HMO | Admitting: Ophthalmology

## 2015-07-12 DIAGNOSIS — H35033 Hypertensive retinopathy, bilateral: Secondary | ICD-10-CM

## 2015-07-12 DIAGNOSIS — I10 Essential (primary) hypertension: Secondary | ICD-10-CM

## 2015-07-12 DIAGNOSIS — H35343 Macular cyst, hole, or pseudohole, bilateral: Secondary | ICD-10-CM | POA: Diagnosis not present

## 2015-07-12 DIAGNOSIS — H353131 Nonexudative age-related macular degeneration, bilateral, early dry stage: Secondary | ICD-10-CM | POA: Diagnosis not present

## 2015-07-12 DIAGNOSIS — H34831 Tributary (branch) retinal vein occlusion, right eye, with macular edema: Secondary | ICD-10-CM

## 2015-07-12 LAB — CBC WITH DIFFERENTIAL/PLATELET
BASOS ABS: 0 10*3/uL (ref 0.0–0.2)
Basos: 1 %
EOS (ABSOLUTE): 0.1 10*3/uL (ref 0.0–0.4)
EOS: 2 %
HEMATOCRIT: 40.4 % (ref 34.0–46.6)
HEMOGLOBIN: 13.4 g/dL (ref 11.1–15.9)
Immature Grans (Abs): 0 10*3/uL (ref 0.0–0.1)
Immature Granulocytes: 0 %
LYMPHS ABS: 1.5 10*3/uL (ref 0.7–3.1)
Lymphs: 29 %
MCH: 29.6 pg (ref 26.6–33.0)
MCHC: 33.2 g/dL (ref 31.5–35.7)
MCV: 89 fL (ref 79–97)
MONOCYTES: 6 %
MONOS ABS: 0.3 10*3/uL (ref 0.1–0.9)
NEUTROS ABS: 3.1 10*3/uL (ref 1.4–7.0)
Neutrophils: 62 %
Platelets: 243 10*3/uL (ref 150–379)
RBC: 4.52 x10E6/uL (ref 3.77–5.28)
RDW: 13.1 % (ref 12.3–15.4)
WBC: 5.1 10*3/uL (ref 3.4–10.8)

## 2015-07-12 LAB — VITAMIN D 25 HYDROXY (VIT D DEFICIENCY, FRACTURES): VIT D 25 HYDROXY: 35.1 ng/mL (ref 30.0–100.0)

## 2015-07-12 LAB — BMP8+EGFR
BUN / CREAT RATIO: 13 (ref 11–26)
BUN: 6 mg/dL — AB (ref 8–27)
CHLORIDE: 102 mmol/L (ref 97–108)
CO2: 23 mmol/L (ref 18–29)
Calcium: 9.3 mg/dL (ref 8.7–10.3)
Creatinine, Ser: 0.47 mg/dL — ABNORMAL LOW (ref 0.57–1.00)
GFR calc non Af Amer: 100 mL/min/{1.73_m2} (ref >59)
GFR, EST AFRICAN AMERICAN: 116 mL/min/{1.73_m2} (ref >59)
GLUCOSE: 110 mg/dL — AB (ref 65–99)
Potassium: 4.7 mmol/L (ref 3.5–5.2)
SODIUM: 142 mmol/L (ref 134–144)

## 2015-07-12 LAB — HEPATIC FUNCTION PANEL
ALK PHOS: 90 IU/L (ref 39–117)
ALT: 20 IU/L (ref 0–32)
AST: 20 IU/L (ref 0–40)
Albumin: 4.6 g/dL (ref 3.5–4.8)
Bilirubin Total: 0.4 mg/dL (ref 0.0–1.2)
Bilirubin, Direct: 0.08 mg/dL (ref 0.00–0.40)
Total Protein: 7.1 g/dL (ref 6.0–8.5)

## 2015-07-12 LAB — LIPID PANEL
CHOLESTEROL TOTAL: 152 mg/dL (ref 100–199)
Chol/HDL Ratio: 3 ratio units (ref 0.0–4.4)
HDL: 51 mg/dL (ref >39)
LDL Calculated: 82 mg/dL (ref 0–99)
Triglycerides: 93 mg/dL (ref 0–149)
VLDL Cholesterol Cal: 19 mg/dL (ref 5–40)

## 2015-07-13 NOTE — Telephone Encounter (Signed)
Pt aware.

## 2015-07-28 ENCOUNTER — Ambulatory Visit (INDEPENDENT_AMBULATORY_CARE_PROVIDER_SITE_OTHER): Payer: Medicare HMO | Admitting: Family Medicine

## 2015-07-28 ENCOUNTER — Encounter: Payer: Self-pay | Admitting: Family Medicine

## 2015-07-28 VITALS — BP 128/88 | HR 63 | Temp 97.0°F | Ht 66.0 in | Wt 108.0 lb

## 2015-07-28 DIAGNOSIS — I10 Essential (primary) hypertension: Secondary | ICD-10-CM | POA: Diagnosis not present

## 2015-07-28 DIAGNOSIS — R059 Cough, unspecified: Secondary | ICD-10-CM

## 2015-07-28 DIAGNOSIS — R05 Cough: Secondary | ICD-10-CM

## 2015-07-28 DIAGNOSIS — J189 Pneumonia, unspecified organism: Secondary | ICD-10-CM

## 2015-07-28 NOTE — Progress Notes (Signed)
Subjective:    [name], female    DOB: [birth year], 68 y.o.   MRN: [id number]  HPI Patient here today for 2 week follow up on cough and HTN. She states she is feeling some better. Patient says her cough is improved. A recent chest x-ray revealed mild left basilar infiltrate and some mild cardiomegaly. After the chest x-ray came back she was treated with a course of azithromycin. The x-ray results were reviewed with the patient today. She has been taking Mucinex using nasal saline and she finished the Zithromax. Patient's recent blood pressure at home was 118/58 multiple readings here were elevated in the 150 range. When I went in the room to talk to her about how she was feeling I rechecked it and got 128/88 in the right arm sitting.      Patient Active Problem List   Diagnosis Date Noted  . Hypertension 01/11/2014  . Vertigo 01/11/2014  . Hyperlipidemia 01/11/2014   Outpatient Encounter Prescriptions as of 07/28/2015  Medication Sig  . amLODipine (NORVASC) 2.5 MG tablet Take 1 tablet (2.5 mg total) by mouth 2 (two) times daily.  Marland Kitchen atorvastatin (LIPITOR) 20 MG tablet Take 1 tablet (20 mg total) by mouth daily.  Marland Kitchen CINNAMON PO Take 1 capsule by mouth daily.  . COMBIGAN 0.2-0.5 % ophthalmic solution   . lisinopril (PRINIVIL,ZESTRIL) 20 MG tablet Take 1 tablet (20 mg total) by mouth 2 (two) times daily.  Marland Kitchen loteprednol (LOTEMAX) 0.5 % ophthalmic suspension Place 1 drop into the right eye 4 (four) times daily.  . metroNIDAZOLE (METROCREAM) 0.75 % cream Apply topically 2 (two) times daily.  . Omega-3 Fatty Acids (FISH OIL PO) Take 1 capsule by mouth daily.  Marland Kitchen PROLENSA 0.07 % SOLN   . TRAVATAN Z 0.004 % SOLN ophthalmic solution   . [DISCONTINUED] azithromycin (ZITHROMAX) 250 MG tablet As directed   No facility-administered encounter medications on file as of 07/28/2015.      Review of Systems  Constitutional: Negative.   HENT: Negative.   Eyes: Negative.     Respiratory: Negative.  Cough: better.   Cardiovascular: Negative.   Gastrointestinal: Negative.   Endocrine: Negative.   Genitourinary: Negative.   Musculoskeletal: Negative.   Skin: Negative.   Allergic/Immunologic: Negative.   Neurological: Negative.   Hematological: Negative.   Psychiatric/Behavioral: Negative.        Objective:   Physical Exam  Constitutional: She is oriented to person, place, and time. She appears well-developed and well-nourished. No distress.  HENT:  Head: Normocephalic and atraumatic.  Right Ear: External ear normal.  Left Ear: External ear normal.  Nose: Nose normal.  Mouth/Throat: Oropharynx is clear and moist.  Eyes: Conjunctivae and EOM are normal. Pupils are equal, round, and reactive to light. Right eye exhibits no discharge. Left eye exhibits no discharge. No scleral icterus.  Neck: Normal range of motion. Neck supple. No thyromegaly present.  Cardiovascular: Normal rate, regular rhythm and normal heart sounds.   No murmur heard. Pulmonary/Chest: Effort normal and breath sounds normal. No respiratory distress. She has no wheezes. She has no rales. She exhibits no tenderness.  Clear anteriorly and posteriorly and no congestion with coughing  Abdominal: Soft. Bowel sounds are normal. She exhibits no mass.  Musculoskeletal: Normal range of motion. She exhibits no edema.  Lymphadenopathy:    She has no cervical adenopathy.  Neurological: She is alert and oriented to person, place, and time.  Skin: Skin is warm and dry. No rash noted.  Psychiatric: She has a normal mood and affect. Her behavior is normal. Judgment and thought content normal.  Nursing note and vitals reviewed.   BP 158/76 mmHg  Pulse 63  Temp(Src) 97 F (36.1 C) (Oral)  Ht 5\' 6"  (1.676 m)  Wt 108 lb (48.988 kg)  BMI 17.44 kg/m2  Repeat blood pressure in the right arm sitting with a large cuff was 128/88.     Assessment & Plan:  1. CAP (community acquired  pneumonia) -This is improved from the lungs were clear today and in fact there was no audible rales in the lung at her last visit and the treatment was based on the chest x-ray finding and the symptoms. She is improved symptomatically too  2. Cough -Cough is improved but I encouraged her to continue to take Mucinex for another 2 or 3 weeks and to come by and repeat the chest x-ray in 4 weeks.  3. Essential hypertension -She should continue her current blood pressure medication and monitoring the blood pressure at home. Today we did get a blood pressure reading that was good.  Patient Instructions  Come in for CXR in 4 weeks  Continue with Mucinex and nasal saline and drink plenty of fluids Continue monitoring blood pressure at home.   Arrie Senate MD

## 2015-07-28 NOTE — Patient Instructions (Addendum)
Come in for CXR in 4 weeks  Continue with Mucinex and nasal saline and drink plenty of fluids Continue monitoring blood pressure at home.

## 2015-07-28 NOTE — Addendum Note (Signed)
Addended by: Zannie Cove on: 07/28/2015 12:11 PM   Modules accepted: Orders

## 2015-08-16 ENCOUNTER — Encounter (INDEPENDENT_AMBULATORY_CARE_PROVIDER_SITE_OTHER): Payer: Medicare HMO | Admitting: Ophthalmology

## 2015-08-16 DIAGNOSIS — H35343 Macular cyst, hole, or pseudohole, bilateral: Secondary | ICD-10-CM | POA: Diagnosis not present

## 2015-08-16 DIAGNOSIS — H34831 Tributary (branch) retinal vein occlusion, right eye, with macular edema: Secondary | ICD-10-CM

## 2015-08-16 DIAGNOSIS — I10 Essential (primary) hypertension: Secondary | ICD-10-CM | POA: Diagnosis not present

## 2015-08-28 ENCOUNTER — Other Ambulatory Visit (INDEPENDENT_AMBULATORY_CARE_PROVIDER_SITE_OTHER): Payer: Medicare HMO

## 2015-08-28 DIAGNOSIS — J189 Pneumonia, unspecified organism: Secondary | ICD-10-CM | POA: Diagnosis not present

## 2015-09-18 ENCOUNTER — Encounter (INDEPENDENT_AMBULATORY_CARE_PROVIDER_SITE_OTHER): Payer: Medicare HMO | Admitting: Ophthalmology

## 2015-09-18 DIAGNOSIS — H34831 Tributary (branch) retinal vein occlusion, right eye, with macular edema: Secondary | ICD-10-CM | POA: Diagnosis not present

## 2015-09-18 DIAGNOSIS — I10 Essential (primary) hypertension: Secondary | ICD-10-CM

## 2015-09-18 DIAGNOSIS — H35033 Hypertensive retinopathy, bilateral: Secondary | ICD-10-CM | POA: Diagnosis not present

## 2015-10-17 DIAGNOSIS — Z1231 Encounter for screening mammogram for malignant neoplasm of breast: Secondary | ICD-10-CM | POA: Diagnosis not present

## 2015-10-17 LAB — HM MAMMOGRAPHY: HM MAMMO: NEGATIVE

## 2015-10-20 ENCOUNTER — Encounter: Payer: Self-pay | Admitting: *Deleted

## 2015-10-23 ENCOUNTER — Encounter (INDEPENDENT_AMBULATORY_CARE_PROVIDER_SITE_OTHER): Payer: Medicare HMO | Admitting: Ophthalmology

## 2015-10-23 DIAGNOSIS — H35033 Hypertensive retinopathy, bilateral: Secondary | ICD-10-CM | POA: Diagnosis not present

## 2015-10-23 DIAGNOSIS — H35343 Macular cyst, hole, or pseudohole, bilateral: Secondary | ICD-10-CM

## 2015-10-23 DIAGNOSIS — I10 Essential (primary) hypertension: Secondary | ICD-10-CM | POA: Diagnosis not present

## 2015-10-23 DIAGNOSIS — H34831 Tributary (branch) retinal vein occlusion, right eye, with macular edema: Secondary | ICD-10-CM | POA: Diagnosis not present

## 2015-10-23 DIAGNOSIS — H353121 Nonexudative age-related macular degeneration, left eye, early dry stage: Secondary | ICD-10-CM | POA: Diagnosis not present

## 2015-11-10 DIAGNOSIS — Z961 Presence of intraocular lens: Secondary | ICD-10-CM | POA: Diagnosis not present

## 2015-11-10 DIAGNOSIS — H35353 Cystoid macular degeneration, bilateral: Secondary | ICD-10-CM | POA: Diagnosis not present

## 2015-11-10 DIAGNOSIS — H40053 Ocular hypertension, bilateral: Secondary | ICD-10-CM | POA: Diagnosis not present

## 2015-11-10 DIAGNOSIS — H4063X1 Glaucoma secondary to drugs, bilateral, mild stage: Secondary | ICD-10-CM | POA: Diagnosis not present

## 2015-11-16 ENCOUNTER — Other Ambulatory Visit: Payer: Self-pay | Admitting: Nurse Practitioner

## 2015-11-27 ENCOUNTER — Encounter (INDEPENDENT_AMBULATORY_CARE_PROVIDER_SITE_OTHER): Payer: Medicare HMO | Admitting: Ophthalmology

## 2015-11-27 DIAGNOSIS — H35033 Hypertensive retinopathy, bilateral: Secondary | ICD-10-CM

## 2015-11-27 DIAGNOSIS — H35343 Macular cyst, hole, or pseudohole, bilateral: Secondary | ICD-10-CM

## 2015-11-27 DIAGNOSIS — I10 Essential (primary) hypertension: Secondary | ICD-10-CM | POA: Diagnosis not present

## 2015-11-27 DIAGNOSIS — H34831 Tributary (branch) retinal vein occlusion, right eye, with macular edema: Secondary | ICD-10-CM | POA: Diagnosis not present

## 2015-12-22 ENCOUNTER — Telehealth: Payer: Self-pay | Admitting: Family Medicine

## 2015-12-22 DIAGNOSIS — I10 Essential (primary) hypertension: Secondary | ICD-10-CM

## 2015-12-22 DIAGNOSIS — E785 Hyperlipidemia, unspecified: Secondary | ICD-10-CM

## 2015-12-22 DIAGNOSIS — E559 Vitamin D deficiency, unspecified: Secondary | ICD-10-CM

## 2015-12-22 NOTE — Telephone Encounter (Signed)
Patient aware that orders are up front for her to pick up.

## 2015-12-22 NOTE — Addendum Note (Signed)
Addended by: Thana Ates on: 12/22/2015 04:53 PM   Modules accepted: Orders

## 2016-01-02 ENCOUNTER — Encounter (INDEPENDENT_AMBULATORY_CARE_PROVIDER_SITE_OTHER): Payer: Medicare HMO | Admitting: Ophthalmology

## 2016-01-02 DIAGNOSIS — H35343 Macular cyst, hole, or pseudohole, bilateral: Secondary | ICD-10-CM | POA: Diagnosis not present

## 2016-01-02 DIAGNOSIS — I10 Essential (primary) hypertension: Secondary | ICD-10-CM

## 2016-01-02 DIAGNOSIS — H353112 Nonexudative age-related macular degeneration, right eye, intermediate dry stage: Secondary | ICD-10-CM

## 2016-01-02 DIAGNOSIS — H34831 Tributary (branch) retinal vein occlusion, right eye, with macular edema: Secondary | ICD-10-CM

## 2016-01-02 DIAGNOSIS — E785 Hyperlipidemia, unspecified: Secondary | ICD-10-CM | POA: Diagnosis not present

## 2016-01-02 DIAGNOSIS — H35033 Hypertensive retinopathy, bilateral: Secondary | ICD-10-CM | POA: Diagnosis not present

## 2016-01-02 DIAGNOSIS — E559 Vitamin D deficiency, unspecified: Secondary | ICD-10-CM | POA: Diagnosis not present

## 2016-01-04 ENCOUNTER — Telehealth: Payer: Self-pay | Admitting: Family Medicine

## 2016-01-04 NOTE — Telephone Encounter (Signed)
Pt aware of labs = done at [facility] - to be scanned in

## 2016-01-10 ENCOUNTER — Ambulatory Visit (INDEPENDENT_AMBULATORY_CARE_PROVIDER_SITE_OTHER): Payer: Medicare HMO | Admitting: Family Medicine

## 2016-01-10 ENCOUNTER — Ambulatory Visit (HOSPITAL_COMMUNITY)
Admission: RE | Admit: 2016-01-10 | Discharge: 2016-01-10 | Disposition: A | Discharge status: Home / Self Care | Payer: Medicare HMO | Source: Ambulatory Visit | Attending: Family Medicine | Admitting: Family Medicine

## 2016-01-10 VITALS — BP 110/70 | HR 84 | Temp 96.9°F | Ht 66.0 in | Wt 106.2 lb

## 2016-01-10 DIAGNOSIS — I739 Peripheral vascular disease, unspecified: Secondary | ICD-10-CM | POA: Insufficient documentation

## 2016-01-10 DIAGNOSIS — R0989 Other specified symptoms and signs involving the circulatory and respiratory systems: Secondary | ICD-10-CM

## 2016-01-10 DIAGNOSIS — S0191XA Laceration without foreign body of unspecified part of head, initial encounter: Secondary | ICD-10-CM

## 2016-01-10 DIAGNOSIS — W1809XA Striking against other object with subsequent fall, initial encounter: Secondary | ICD-10-CM | POA: Insufficient documentation

## 2016-01-10 DIAGNOSIS — S060X1A Concussion with loss of consciousness of 30 minutes or less, initial encounter: Secondary | ICD-10-CM | POA: Insufficient documentation

## 2016-01-10 DIAGNOSIS — R42 Dizziness and giddiness: Secondary | ICD-10-CM | POA: Diagnosis not present

## 2016-01-10 MED ORDER — AZITHROMYCIN 250 MG PO TABS: ORAL_TABLET | ORAL | Status: DC

## 2016-01-10 NOTE — Progress Notes (Signed)
BP 110/70 mmHg  Pulse 84  Temp(Src) 96.9 F (36.1 C) (Oral)  Ht 5\' 6"  (1.676 m)  Wt 106 lb 3.2 oz (48.172 kg)  BMI 17.15 kg/m2   Subjective:    [name], female    DOB: April 06, 1946, 70 y.o.   MRN: [id number]  HPI: Mckenzie [name] is a 70 y.o. female presenting on 01/10/2016 for [facility] and hit head this morning   HPI Fall and hit her head this morning Earlier this morning around 7:30 in the morning she fell and hit her head against an object on the wall. Her husband was not in the room immediately but heard her fall and came to her assistance and found her on the floor unconscious. She was unconscious for a matter of 20-30 seconds per him. Since this point she's been having pain in the left side of her head which is where she has a laceration and there is a little bit of pain in her right knee. Prior to the fall she was feeling lightheaded and dizzy which she has felt previously and she usually stops and rests for a minute and is able to let it pass but this time it Coming and made her fall down and hit her head. Her headache is mild and not severe. When this occurred she took her blood pressure soon after and was 93/55. Here in the office her blood pressure is 110/70.  Chest congestion and postnasal drainage Patient has been having chest congestion and cough and postnasal drainage has been going on for the past 3 days. She denies any fevers or chills or shortness of breath or wheezing. She would like to know if she can get something to help clear this up.  Relevant past medical, surgical, family and social history reviewed and updated as indicated. Interim medical history since our last visit reviewed. Allergies and medications reviewed and updated.  Review of Systems  Constitutional: Negative for fever and chills.  HENT: Positive for congestion, postnasal drip, rhinorrhea, sinus pressure, sneezing and sore throat. Negative for ear discharge and ear pain.   Eyes:  Negative for pain, redness and visual disturbance.  Respiratory: Positive for cough. Negative for chest tightness and shortness of breath.   Cardiovascular: Negative for chest pain and leg swelling.  Genitourinary: Negative for dysuria and difficulty urinating.  Musculoskeletal: Negative for back pain and gait problem.  Skin: Positive for wound. Negative for rash.  Neurological: Negative for dizziness, tremors, facial asymmetry, speech difficulty, weakness, light-headedness, numbness and headaches.  Psychiatric/Behavioral: Negative for behavioral problems and agitation.  All other systems reviewed and are negative.   Per HPI unless specifically indicated above     Medication List       This list is accurate as of: 01/10/16 10:09 AM.  Always use your most recent med list.               amLODipine 2.5 MG tablet  Commonly known as:  NORVASC  TAKE ONE TABLET BY MOUTH TWICE DAILY     atorvastatin 20 MG tablet  Commonly known as:  LIPITOR  Take 1 tablet (20 mg total) by mouth daily.     CINNAMON PO  Take 1 capsule by mouth daily.     COMBIGAN 0.2-0.5 % ophthalmic solution  Generic drug:  brimonidine-timolol     FISH OIL PO  Take 1 capsule by mouth daily.     lisinopril 20 MG tablet  Commonly known as:  PRINIVIL,ZESTRIL  Take  1 tablet (20 mg total) by mouth 2 (two) times daily.     loteprednol 0.5 % ophthalmic suspension  Commonly known as:  LOTEMAX  Place 1 drop into the right eye 4 (four) times daily.     metroNIDAZOLE 0.75 % cream  Commonly known as:  METROCREAM  Apply topically 2 (two) times daily.     PROLENSA 0.07 % Soln  Generic drug:  Bromfenac Sodium     TRAVATAN Z 0.004 % Soln ophthalmic solution  Generic drug:  Travoprost (BAK Free)           Objective:    BP 110/70 mmHg  Pulse 84  Temp(Src) 96.9 F (36.1 C) (Oral)  Ht 5\' 6"  (1.676 m)  Wt 106 lb 3.2 oz (48.172 kg)  BMI 17.15 kg/m2  Wt Readings from Last 3 Encounters:  01/10/16 106 lb 3.2 oz  (48.172 kg)  07/28/15 108 lb (48.988 kg)  07/11/15 106 lb (48.081 kg)    Physical Exam  Constitutional: She is oriented to person, place, and time. She appears well-developed and well-nourished. No distress.  HENT:  Right Ear: Tympanic membrane, external ear and ear canal normal.  Left Ear: Tympanic membrane, external ear and ear canal normal.  Nose: Mucosal edema and rhinorrhea present. No epistaxis. Right sinus exhibits no maxillary sinus tenderness and no frontal sinus tenderness. Left sinus exhibits no maxillary sinus tenderness and no frontal sinus tenderness.  Mouth/Throat: Uvula is midline and mucous membranes are normal. Posterior oropharyngeal edema and posterior oropharyngeal erythema present. No oropharyngeal exudate or tonsillar abscesses.  Eyes: Conjunctivae and EOM are normal. Pupils are equal, round, and reactive to light.  Cardiovascular: Normal rate, regular rhythm, normal heart sounds and intact distal pulses.   No murmur heard. Pulmonary/Chest: Effort normal and breath sounds normal. No respiratory distress. She has no wheezes.  Musculoskeletal: Normal range of motion. She exhibits no edema or tenderness.  Neurological: She is alert and oriented to person, place, and time. Coordination normal.  Skin: Skin is warm and dry. Laceration (Small half centimeter horizontal laceration on left forehead. Small abrasion on right neck and blood blister on left lower lip) noted. No rash noted. She is not diaphoretic.  Psychiatric: She has a normal mood and affect. Her behavior is normal.  Nursing note and vitals reviewed.   Skin laceration repair: 0.5 cm laceration on left forehead. 2% lidocaine with epinephrine was used for local anesthesia, 1 mL. 5-0 Vicryl was used to repair the wound.  Placed 1 sutures. Incision was approximated well and topical antibiotic was used and then it was covered by 4 x 4 and tape told in place. Procedure was tolerated well    Assessment & Plan:   Problem  List Items Addressed This Visit    None    Visit Diagnoses    Fall against object, initial encounter    -  Primary    Relevant Orders    CT Head Wo Contrast (Completed)    Laceration of head, initial encounter        Small half a centimeter head laceration that goes all the way through the dermis, we'll suture, send for CT of head    Relevant Orders    CT Head Wo Contrast (Completed)    Concussion with loss of consciousness, 30 minutes or less, initial encounter        Relevant Orders    CT Head Wo Contrast (Completed)    Chest congestion        Relevant  Medications    azithromycin (ZITHROMAX) 250 MG tablet        Follow up plan: Return in about 5 days (around 01/15/2016), or if symptoms worsen or fail to improve, for Suture removal.  Counseling provided for all of the vaccine components Orders Placed This Encounter  Procedures  . CT Head Wo Contrast    Caryl Pina, MD [facility] 01/10/2016, 10:09 AM

## 2016-01-10 NOTE — Patient Instructions (Signed)
Traumatic Brain Injury Traumatic brain injury (TBI) is an injury to your brain from a blow to your head (closed injury) or an object penetrating your skull and entering your brain (open injury). The severity of TBI varies significantly from one person to the next. Some TBIs cause you to pass out (lose consciousness) immediately and for a long period of time. Other TBIs do not cause any loss of consciousness.  Symptoms of any type of TBI can be long lasting (chronic). TBI can interfere with memory and speech. TBI can also cause chronic symptoms like headache or dizziness. CAUSES  TBI is caused by a closed or open injury. RISK FACTORS You may be at higher risk for TBI if you:  Are 75 or older.  Are a man.  Are in a car accident.  Play a contact sport, especially football, hockey, or soccer.  Do not wear protective gear while playing sports.  Are in the [employer]  Are a victim of violence.  Abuse drugs or alcohol.  Have had a previous TBI. SIGNS AND SYMPTOMS  Signs and symptoms of TBI may occur right away or not until days, weeks, or months after the injury. They may last for days, weeks, months, or years. Symptoms may include:  Loss of consciousness.  Headache.  Confusion.  Fatigue.  Changes in sleep.  Dizziness.  Mood or personality changes.  Memory problems.  Nausea or vomiting or both.  Seizures.  Clumsiness.  Slurred speech.  Depression and anxiety.  Anger.  Inability to control emotions or actions (impulse control).  Loss of or dulling of your senses, such as hearing, vision, and touch. This can include:  Blurred vision.  Ringing in your ears. DIAGNOSIS  TBI may be diagnosed by a medical history and physical exam. Your health care provider will also do a neurologic exam to check your:  Reflexes.  Sensations.  Alertness.  Memory.  Vision.  Hearing.  Coordination. Your health care provider will also do tests to diagnose the extent of  your TBI, such as a CT scan of your brain and skull. One way to determine the severity of your TBI is with a scoring system called the Glasgow Coma Scale (GCS). It measures eye opening, motor response, and verbal response. The higher the score, the milder the TBI.  Your TBI may be described as mild, moderate, or severe:   Mild TBI (concussion).  Symptoms of mild TBI usually go away on their own. This can take weeks or months, depending on the type of concussion.  Your GCS will be 13-15.  Your brain CT scan will be normal.  You may or may not have a short hospital stay.  Moderate TBI.  Your GCS will be 9-12.  Your brain CT scan will be abnormal.  You will likely need a short hospital stay.  Severe TBI.  Your GCS will be 3-8.  Your brain CT scan will be abnormal.  You may need a long stay in the hospital. TREATMENT Emergency treatment of TBI may involve measures to maintain a clear airway and stable blood pressure. Brain surgery may be needed to:  Remove a blood clot.  Repair bleeding.  Remove an object that has penetrated the brain, such as a skull fragment or a bullet. Other treatments depend on your chronic signs and symptoms. These treatments include the following types of therapy:  Physical.  Occupational.  Speech and language.  Mental health.  Social support. HOME CARE INSTRUCTIONS  Carefully follow all your health care  provider's instructions.  Work closely with all your therapists, if necessary.   Take medicines only as directed by your health care provider. Do not take aspirin or other anti-inflammatory medicines such as ibuprofen or naproxen unless approved by your health care provider.  Do not abuse illegal drugs.   Limit alcohol intake to no more than 1 drink per day for nonpregnant women and 2 drinks per day for men. One drink equals 12 ounces of beer, 5 ounces of wine, or 1 ounces of hard liquor.  Avoid any situation where there is potential  for another head injury, such as football, hockey, soccer, basketball, martial arts, downhill snow sports, and horseback riding. Do not do these activities until your health care provider approves.   Rest. Rest helps the brain to heal. Make sure you:   Get plenty of sleep at night. Avoid staying up late at night.   Keep the same bedtime hours on weekends and weekdays.   Rest during the day. Take daytime naps or rest breaks when you feel tired.  Avoid excessive visual stimulation while recovering from a TBI. This includes work on the computer, watching TV, and reading.  Try to avoid activities that cause physical or mental stress. Stay home from work or school as directed by your health care provider.  Make lists to plan your day and help your memory.  Do not drive, ride a bicycle, or operate heavy machinery until your health care provider approves.  Seek support from friends and family.  Keep all follow-up visits as directed by your health care provider. This is important.  Watch your symptoms and tell others to do the same. Complications sometimes occur after a TBI. PREVENTION   Wear a helmet while biking, skiing, skateboarding, skating, or doing similar activities. Wear your seatbelt while driving.  Do not abuse alcohol or drugs.  Do not drink and drive.  Prevent falls at home by:  Removing clutter and tripping hazards, including loose rugs, from floors and stairways.  Using grab bars in bathrooms and handrails by stairs.   Placing nonslip mats on floors and in bathtubs.   Improving lighting in dim areas.  SEEK MEDICAL CARE IF: Seek medical care if you have any of the following symptoms for more than two weeks after your injury:  Chronic headaches.   Dizziness or balance problems.   Nausea.   Vision problems.   Increased sensitivity to noise or light.   Depression or mood swings.   Anxiety or irritability.   Memory problems.   Difficulty  concentrating or paying attention.   Sleep problems.   Feeling tired all the time.  SEEK IMMEDIATE MEDICAL CARE IF:  You have confusion or unusual drowsiness.  It is difficult to wake you up.   You have nausea or persistent, forceful vomiting.   You feel like you are moving when you are not (vertigo). Your eyes may move rapidly back and forth.  You have convulsions or faint.   You have severe, persistent headaches that are not relieved by medicine.   You cannot use your arms or legs normally.   One of your pupils is larger than the other.   You have clear or bloody discharge from your nose or ears.  Your problems are getting worse, not better.    This information is not intended to replace advice given to you by your health care provider. Make sure you discuss any questions you have with your health care provider.   Document Released:  09/13/2002 Document Revised: 10/14/2014 Document Reviewed: 01/06/2014 Elsevier Interactive Patient Education [employer].

## 2016-01-15 ENCOUNTER — Ambulatory Visit (INDEPENDENT_AMBULATORY_CARE_PROVIDER_SITE_OTHER): Payer: Medicare HMO | Admitting: Family Medicine

## 2016-01-15 ENCOUNTER — Encounter: Payer: Self-pay | Admitting: Family Medicine

## 2016-01-15 VITALS — BP 122/55 | HR 66 | Temp 96.7°F | Ht 66.0 in | Wt 107.0 lb

## 2016-01-15 DIAGNOSIS — J320 Chronic maxillary sinusitis: Secondary | ICD-10-CM | POA: Diagnosis not present

## 2016-01-15 DIAGNOSIS — Z1211 Encounter for screening for malignant neoplasm of colon: Secondary | ICD-10-CM | POA: Diagnosis not present

## 2016-01-15 DIAGNOSIS — L719 Rosacea, unspecified: Secondary | ICD-10-CM | POA: Diagnosis not present

## 2016-01-15 DIAGNOSIS — I10 Essential (primary) hypertension: Secondary | ICD-10-CM

## 2016-01-15 DIAGNOSIS — E559 Vitamin D deficiency, unspecified: Secondary | ICD-10-CM | POA: Diagnosis not present

## 2016-01-15 DIAGNOSIS — E785 Hyperlipidemia, unspecified: Secondary | ICD-10-CM

## 2016-01-15 DIAGNOSIS — J301 Allergic rhinitis due to pollen: Secondary | ICD-10-CM | POA: Diagnosis not present

## 2016-01-15 DIAGNOSIS — I499 Cardiac arrhythmia, unspecified: Secondary | ICD-10-CM | POA: Diagnosis not present

## 2016-01-15 MED ORDER — AMOXICILLIN-POT CLAVULANATE 875-125 MG PO TABS: 1.0000 | ORAL_TABLET | Freq: Two times a day (BID) | ORAL | Status: DC

## 2016-01-15 MED ORDER — AMLODIPINE BESYLATE 2.5 MG PO TABS
2.5000 mg | ORAL_TABLET | Freq: Two times a day (BID) | ORAL | Status: DC
Start: 2016-01-15 — End: 2016-09-03

## 2016-01-15 MED ORDER — METRONIDAZOLE 0.75 % EX CREA: TOPICAL_CREAM | Freq: Two times a day (BID) | CUTANEOUS | Status: DC

## 2016-01-15 MED ORDER — LISINOPRIL 10 MG PO TABS: ORAL_TABLET | ORAL | Status: DC

## 2016-01-15 MED ORDER — FLUTICASONE PROPIONATE 50 MCG/ACT NA SUSP: 2.0000 | Freq: Every day | NASAL | Status: DC

## 2016-01-15 NOTE — Progress Notes (Signed)
Subjective:    [name], female    DOB: December 31, 1945, 70 y.o.   MRN: [id number]  HPI Pt here for follow up and management of chronic medical problems which includes hypertension and hyperlipidemia. She is taking medications regularly.The patient did have a recent fall and was seen here recently for this. She brings in blood pressures for review and a lot of these blood pressures are below [id number] systolic. The low blood pressure may have certainly contributed to her recent fall. She does complain of some chest congestion.The patient denies any chest pain or shortness of breath. She brings in outside blood pressures for review and a lot of these are as noted above low especially in the morning. She has no problems with her GI tract and no problems with voiding. She indicates that the drug congestion that she has both head and chest and is been green colored for about 10 days and this is why she was given the antibiotic prescription. The CT scan was reviewed and the patient was given a copy of this.      Patient Active Problem List   Diagnosis Date Noted  . Hypertension 01/11/2014  . Vertigo 01/11/2014  . Hyperlipidemia 01/11/2014   Outpatient Encounter Prescriptions as of 01/15/2016  Medication Sig  . amLODipine (NORVASC) 2.5 MG tablet TAKE ONE TABLET BY MOUTH TWICE DAILY  . atorvastatin (LIPITOR) 20 MG tablet Take 1 tablet (20 mg total) by mouth daily.  Marland Kitchen CINNAMON PO Take 1 capsule by mouth daily.  . COMBIGAN 0.2-0.5 % ophthalmic solution   . dorzolamide (TRUSOPT) 2 % ophthalmic solution   . latanoprost (XALATAN) 0.005 % ophthalmic solution   . lisinopril (PRINIVIL,ZESTRIL) 20 MG tablet Take 1 tablet (20 mg total) by mouth 2 (two) times daily.  Marland Kitchen loteprednol (LOTEMAX) 0.5 % ophthalmic suspension Place 1 drop into the right eye 4 (four) times daily.  . metroNIDAZOLE (METROCREAM) 0.75 % cream Apply topically 2 (two) times daily.  . Omega-3 Fatty Acids (FISH OIL PO) Take 1  capsule by mouth daily.  Marland Kitchen PROLENSA 0.07 % SOLN   . [DISCONTINUED] azithromycin (ZITHROMAX) 250 MG tablet Take 2 the first day and then one each day after.  . [DISCONTINUED] TRAVATAN Z 0.004 % SOLN ophthalmic solution    No facility-administered encounter medications on file as of 01/15/2016.     Review of Systems  Constitutional: Negative.   HENT: Positive for congestion.   Eyes: Negative.   Respiratory: Negative.   Cardiovascular: Negative.   Gastrointestinal: Negative.   Endocrine: Negative.   Genitourinary: Negative.   Musculoskeletal: Positive for arthralgias (from recent fall).  Skin: Negative.   Allergic/Immunologic: Negative.   Neurological: Positive for light-headedness (at times).  Hematological: Negative.   Psychiatric/Behavioral: Negative.        Objective:   Physical Exam  Constitutional: She is oriented to person, place, and time. She appears well-nourished.  Thin and kyphotic and pleasant and alert.  HENT:  Head: Normocephalic.  Right Ear: External ear normal.  Left Ear: External ear normal.  Mouth/Throat: Oropharynx is clear and moist.  Nasal pallor The suture was removed from the for head without problems.  Eyes: Conjunctivae and EOM are normal. Pupils are equal, round, and reactive to light. Right eye exhibits no discharge. Left eye exhibits no discharge. No scleral icterus.  Neck: Normal range of motion. Neck supple. No thyromegaly present.  No bruits or thyromegaly  Cardiovascular: Normal rate, regular rhythm, normal heart sounds and intact distal pulses.  No murmur heard. The heart was somewhat irregular at about 72/m  Pulmonary/Chest: Effort normal and breath sounds normal. No respiratory distress. She has no wheezes. She has no rales. She exhibits no tenderness.  Clear anteriorly and posteriorly  Abdominal: Soft. Bowel sounds are normal. She exhibits no mass. There is no tenderness. There is no rebound and no guarding.  Musculoskeletal: Normal range  of motion. She exhibits no edema.  Lymphadenopathy:    She has no cervical adenopathy.  Neurological: She is alert and oriented to person, place, and time. She has normal reflexes. No cranial nerve deficit.  Skin: Skin is warm and dry. No rash noted.  Psychiatric: She has a normal mood and affect. Her behavior is normal. Judgment and thought content normal.  Nursing note and vitals reviewed.  BP 122/55 mmHg  Pulse 66  Temp(Src) 96.7 F (35.9 C) (Oral)  Ht 5\' 6"  (1.676 m)  Wt 107 lb (48.535 kg)  BMI 17.28 kg/m2  EKG: PACs and sinus rhythm with rate variation       Assessment & Plan:  1. Essential hypertension -We will reduce her lisinopril to 10 mg in the morning and continue 20 mg at night and in addition continue amlodipine 2-1/2 mg twice daily. She will bring in blood pressure readings for review in a couple weeks. - EKG 12-Lead  2. Hyperlipidemia -Continue with atorvastatin and low-cholesterol diet  3. Vitamin D deficiency -The patient should make sure that she takes her vitamin D3 1000, 1 daily along with 1200 mg of calcium daily.  4. Special screening for malignant neoplasms, colon -The patient refuses to do the FOBT - Fecal occult blood, imunochemical; Future  5. Rosacea -Continue as needed - metroNIDAZOLE (METROCREAM) 0.75 % cream; Apply topically 2 (two) times daily.  Dispense: 45 g; Refill: 3  6. Irregular heartbeat - EKG 12-Lead  7. Allergic rhinitis due to pollen -Do Flonase 1 spray each nostril at bedtime  8. Chronic maxillary sinusitis -Take Augmentin as directed with food -Use nasal saline  Meds ordered this encounter  Medications  . latanoprost (XALATAN) 0.005 % ophthalmic solution    Sig:   . dorzolamide (TRUSOPT) 2 % ophthalmic solution    Sig:   . amLODipine (NORVASC) 2.5 MG tablet    Sig: Take 1 tablet (2.5 mg total) by mouth 2 (two) times daily.    Dispense:  180 tablet    Refill:  1  . metroNIDAZOLE (METROCREAM) 0.75 % cream    Sig:  Apply topically 2 (two) times daily.    Dispense:  45 g    Refill:  3  . fluticasone (FLONASE) 50 MCG/ACT nasal spray    Sig: Place 2 sprays into both nostrils daily.    Dispense:  16 g    Refill:  6  . amoxicillin-clavulanate (AUGMENTIN) 875-125 MG tablet    Sig: Take 1 tablet by mouth 2 (two) times daily.    Dispense:  20 tablet    Refill:  0   Patient Instructions                       Medicare Annual Wellness Visit  [facility] and the medical providers at [facility] strive to bring you the best medical care.  In doing so we not only want to address your current medical conditions and concerns but also to detect new conditions early and prevent illness, disease and health-related problems.    Medicare offers a yearly Wellness Visit  which allows our clinical staff to assess your need for preventative services including immunizations, lifestyle education, counseling to decrease risk of preventable diseases and screening for fall risk and other medical concerns.    This visit is provided free of charge (no copay) for all Medicare recipients. The clinical pharmacists at [facility] have begun to conduct these Wellness Visits which will also include a thorough review of all your medications.    As you primary medical provider recommend that you make an appointment for your Annual Wellness Visit if you have not done so already this year.  You may set up this appointment before you leave today or you may call back [phone] and schedule an appointment.  Please make sure when you call that you mention that you are scheduling your Annual Wellness Visit with the clinical pharmacist so that the appointment may be made for the proper length of time.     Continue current medications. Continue good therapeutic lifestyle changes which include good diet and exercise. Fall precautions discussed with patient. If an FOBT was given today- please return it to  our front desk. If you are over 51 years old - you may need Prevnar 79 or the adult Pneumonia vaccine.  **Flu shots are available--- please call and schedule a FLU-CLINIC appointment**  After your visit with [place] today you will receive a survey in the mail or online from [employer] regarding your care with [place]. Please take a moment to fill this out. Your feedback is very important to [place] as you can help [place] better understand your patient needs as well as improve your experience and satisfaction. WE CARE ABOUT YOU!!!   The patient should have an FOBT done. She will get this with her lab in [place]. The recent CT scan was reviewed and did show increased sinus congestion in the left maxillary sinus. She did take a Z-Pak. She still has sinus congestion and chest congestion. She should take the Augmentin as directed She should use the Flonase nasal spray 1 spray each nostril at bedtime She should drink plenty of fluids and use nasal saline She should reduce her lisinopril to 10 mg in the morning and 20 at night and stay on amlodipine 2-1/2 mg twice daily   Arrie Senate MD

## 2016-01-15 NOTE — Patient Instructions (Addendum)
Medicare Annual Wellness Visit  [facility] and the medical providers at [facility] strive to bring you the best medical care.  In doing so we not only want to address your current medical conditions and concerns but also to detect new conditions early and prevent illness, disease and health-related problems.    Medicare offers a yearly Wellness Visit which allows our clinical staff to assess your need for preventative services including immunizations, lifestyle education, counseling to decrease risk of preventable diseases and screening for fall risk and other medical concerns.    This visit is provided free of charge (no copay) for all Medicare recipients. The clinical pharmacists at [facility] have begun to conduct these Wellness Visits which will also include a thorough review of all your medications.    As you primary medical provider recommend that you make an appointment for your Annual Wellness Visit if you have not done so already this year.  You may set up this appointment before you leave today or you may call back [phone] and schedule an appointment.  Please make sure when you call that you mention that you are scheduling your Annual Wellness Visit with the clinical pharmacist so that the appointment may be made for the proper length of time.     Continue current medications. Continue good therapeutic lifestyle changes which include good diet and exercise. Fall precautions discussed with patient. If an FOBT was given today- please return it to our front desk. If you are over 74 years old - you may need Prevnar 44 or the adult Pneumonia vaccine.  **Flu shots are available--- please call and schedule a FLU-CLINIC appointment**  After your visit with [place] today you will receive a survey in the mail or online from [employer] regarding your care with [place]. Please take a moment to fill this out. Your feedback is very  important to [place] as you can help [place] better understand your patient needs as well as improve your experience and satisfaction. WE CARE ABOUT YOU!!!   The patient should have an FOBT done. She will get this with her lab in [place]. The recent CT scan was reviewed and did show increased sinus congestion in the left maxillary sinus. She did take a Z-Pak. She still has sinus congestion and chest congestion. She should take the Augmentin as directed She should use the Flonase nasal spray 1 spray each nostril at bedtime She should drink plenty of fluids and use nasal saline She should reduce her lisinopril to 10 mg in the morning and 20 at night and stay on amlodipine 2-1/2 mg twice daily Please bring blood pressures by for review and a couple weeks

## 2016-01-25 ENCOUNTER — Encounter: Payer: Self-pay | Admitting: Family Medicine

## 2016-02-06 ENCOUNTER — Encounter (INDEPENDENT_AMBULATORY_CARE_PROVIDER_SITE_OTHER): Payer: Medicare HMO | Admitting: Ophthalmology

## 2016-02-06 DIAGNOSIS — H34831 Tributary (branch) retinal vein occlusion, right eye, with macular edema: Secondary | ICD-10-CM | POA: Diagnosis not present

## 2016-02-06 DIAGNOSIS — H35033 Hypertensive retinopathy, bilateral: Secondary | ICD-10-CM | POA: Diagnosis not present

## 2016-02-06 DIAGNOSIS — H35343 Macular cyst, hole, or pseudohole, bilateral: Secondary | ICD-10-CM | POA: Diagnosis not present

## 2016-02-06 DIAGNOSIS — H353121 Nonexudative age-related macular degeneration, left eye, early dry stage: Secondary | ICD-10-CM

## 2016-02-06 DIAGNOSIS — H353112 Nonexudative age-related macular degeneration, right eye, intermediate dry stage: Secondary | ICD-10-CM | POA: Diagnosis not present

## 2016-02-06 DIAGNOSIS — I10 Essential (primary) hypertension: Secondary | ICD-10-CM | POA: Diagnosis not present

## 2016-03-12 ENCOUNTER — Encounter (INDEPENDENT_AMBULATORY_CARE_PROVIDER_SITE_OTHER): Payer: Medicare HMO | Admitting: Ophthalmology

## 2016-03-12 DIAGNOSIS — H35033 Hypertensive retinopathy, bilateral: Secondary | ICD-10-CM | POA: Diagnosis not present

## 2016-03-12 DIAGNOSIS — I10 Essential (primary) hypertension: Secondary | ICD-10-CM | POA: Diagnosis not present

## 2016-03-12 DIAGNOSIS — H34831 Tributary (branch) retinal vein occlusion, right eye, with macular edema: Secondary | ICD-10-CM | POA: Diagnosis not present

## 2016-03-12 DIAGNOSIS — H35343 Macular cyst, hole, or pseudohole, bilateral: Secondary | ICD-10-CM

## 2016-03-19 DIAGNOSIS — H40053 Ocular hypertension, bilateral: Secondary | ICD-10-CM | POA: Diagnosis not present

## 2016-03-19 DIAGNOSIS — H4063X1 Glaucoma secondary to drugs, bilateral, mild stage: Secondary | ICD-10-CM | POA: Diagnosis not present

## 2016-03-19 DIAGNOSIS — H35353 Cystoid macular degeneration, bilateral: Secondary | ICD-10-CM | POA: Diagnosis not present

## 2016-03-19 DIAGNOSIS — Z961 Presence of intraocular lens: Secondary | ICD-10-CM | POA: Diagnosis not present

## 2016-04-16 ENCOUNTER — Encounter (INDEPENDENT_AMBULATORY_CARE_PROVIDER_SITE_OTHER): Payer: Medicare HMO | Admitting: Ophthalmology

## 2016-04-16 DIAGNOSIS — H34831 Tributary (branch) retinal vein occlusion, right eye, with macular edema: Secondary | ICD-10-CM | POA: Diagnosis not present

## 2016-04-16 DIAGNOSIS — H35033 Hypertensive retinopathy, bilateral: Secondary | ICD-10-CM | POA: Diagnosis not present

## 2016-04-16 DIAGNOSIS — H35343 Macular cyst, hole, or pseudohole, bilateral: Secondary | ICD-10-CM | POA: Diagnosis not present

## 2016-04-16 DIAGNOSIS — I10 Essential (primary) hypertension: Secondary | ICD-10-CM

## 2016-04-17 DIAGNOSIS — Z961 Presence of intraocular lens: Secondary | ICD-10-CM | POA: Diagnosis not present

## 2016-04-17 DIAGNOSIS — H35353 Cystoid macular degeneration, bilateral: Secondary | ICD-10-CM | POA: Diagnosis not present

## 2016-04-17 DIAGNOSIS — H40053 Ocular hypertension, bilateral: Secondary | ICD-10-CM | POA: Diagnosis not present

## 2016-04-17 DIAGNOSIS — H4063X1 Glaucoma secondary to drugs, bilateral, mild stage: Secondary | ICD-10-CM | POA: Diagnosis not present

## 2016-05-09 DIAGNOSIS — H401121 Primary open-angle glaucoma, left eye, mild stage: Secondary | ICD-10-CM | POA: Diagnosis not present

## 2016-05-09 DIAGNOSIS — H401123 Primary open-angle glaucoma, left eye, severe stage: Secondary | ICD-10-CM | POA: Diagnosis not present

## 2016-05-14 ENCOUNTER — Encounter (INDEPENDENT_AMBULATORY_CARE_PROVIDER_SITE_OTHER): Payer: Medicare HMO | Admitting: Ophthalmology

## 2016-05-17 ENCOUNTER — Encounter (INDEPENDENT_AMBULATORY_CARE_PROVIDER_SITE_OTHER): Payer: Medicare HMO | Admitting: Ophthalmology

## 2016-05-17 DIAGNOSIS — I10 Essential (primary) hypertension: Secondary | ICD-10-CM | POA: Diagnosis not present

## 2016-05-17 DIAGNOSIS — H34831 Tributary (branch) retinal vein occlusion, right eye, with macular edema: Secondary | ICD-10-CM | POA: Diagnosis not present

## 2016-05-17 DIAGNOSIS — H35033 Hypertensive retinopathy, bilateral: Secondary | ICD-10-CM

## 2016-05-21 ENCOUNTER — Encounter (INDEPENDENT_AMBULATORY_CARE_PROVIDER_SITE_OTHER): Payer: Medicare HMO | Admitting: Ophthalmology

## 2016-05-30 ENCOUNTER — Ambulatory Visit (INDEPENDENT_AMBULATORY_CARE_PROVIDER_SITE_OTHER): Payer: Medicare HMO | Admitting: Family Medicine

## 2016-05-30 ENCOUNTER — Encounter: Payer: Self-pay | Admitting: Family Medicine

## 2016-05-30 VITALS — BP 179/80 | HR 84 | Temp 97.7°F | Ht 66.0 in | Wt 105.8 lb

## 2016-05-30 DIAGNOSIS — R21 Rash and other nonspecific skin eruption: Secondary | ICD-10-CM

## 2016-05-30 MED ORDER — MUPIROCIN 2 % EX OINT: 1.0000 "application " | TOPICAL_OINTMENT | Freq: Two times a day (BID) | CUTANEOUS | 0 refills | Status: DC

## 2016-05-30 NOTE — Progress Notes (Signed)
   HPI  Patient presents today here with rash on right medial calf.  Patient's lines over the last 5 days she's developed this rash, started out with a small red blood blister and then developed a few other areas of blistering along with an area of erythema that slightly firm.  It seems to be stable over the last 3 days and not changing.  She feels well, denies fever, chills, sweats. She is also tolerating food and fluids normally  PMH: Smoking status noted ROS: Per HPI  Objective: BP (!) 179/80   Pulse 84   Temp 97.7 F (36.5 C) (Oral)   Ht 5\' 6"  (1.676 m)   Wt 105 lb 12.8 oz (48 kg)   BMI 17.08 kg/m  Gen: NAD, alert, cooperative with exam HEENT: NCAT, EOMI, PERRL CV: RRR, good S1/S2, no murmur Resp: CTABL, no wheezes, non-labored Ext: No edema, warm Neuro: Alert and oriented, No gross deficits  Skin: 4 cm x 4.5 cm area of erythema with erythematous papules within, one area that is 2 cm x 1.5 cm, pink, almost circular area that is slightly indurated, nontender, not warm, or fluctuant   Assessment and plan:  # Rash Unclear etiology, because of the small area of induration I have treated her with Bactroban ointment I do not believe this is true cellulitis Dermatitis is a possibility, also consider vasculitis However her symptoms are very mild and the rash at this time is very reassuring in appearance. Low threshold for return, watchful waiting    Meds ordered this encounter  Medications  . DUREZOL 0.05 % EMUL  . mupirocin ointment (BACTROBAN) 2 %    Sig: Place 1 application into the nose 2 (two) times daily.    Dispense:  22 g    Refill:  [place], MD [facility] 05/30/2016, 2:45 PM

## 2016-05-30 NOTE — Patient Instructions (Addendum)
Great to meet you!  Try mupirocin twice daily for 7 days, please come back if it is getting worse or does not begin to improve.

## 2016-06-05 ENCOUNTER — Encounter (INDEPENDENT_AMBULATORY_CARE_PROVIDER_SITE_OTHER): Payer: Medicare HMO | Admitting: Ophthalmology

## 2016-06-05 DIAGNOSIS — H35033 Hypertensive retinopathy, bilateral: Secondary | ICD-10-CM | POA: Diagnosis not present

## 2016-06-05 DIAGNOSIS — H35343 Macular cyst, hole, or pseudohole, bilateral: Secondary | ICD-10-CM

## 2016-06-05 DIAGNOSIS — I10 Essential (primary) hypertension: Secondary | ICD-10-CM | POA: Diagnosis not present

## 2016-06-05 DIAGNOSIS — H34831 Tributary (branch) retinal vein occlusion, right eye, with macular edema: Secondary | ICD-10-CM | POA: Diagnosis not present

## 2016-06-26 ENCOUNTER — Telehealth: Payer: Self-pay | Admitting: Family Medicine

## 2016-06-26 DIAGNOSIS — Z139 Encounter for screening, unspecified: Secondary | ICD-10-CM

## 2016-06-26 DIAGNOSIS — I10 Essential (primary) hypertension: Secondary | ICD-10-CM

## 2016-06-26 DIAGNOSIS — E785 Hyperlipidemia, unspecified: Secondary | ICD-10-CM

## 2016-06-26 NOTE — Telephone Encounter (Signed)
Labs printed and placed up front for patient to pick up. Patient aware.

## 2016-06-27 ENCOUNTER — Other Ambulatory Visit: Payer: Self-pay | Admitting: Family Medicine

## 2016-06-27 DIAGNOSIS — I10 Essential (primary) hypertension: Secondary | ICD-10-CM | POA: Diagnosis not present

## 2016-06-27 DIAGNOSIS — E785 Hyperlipidemia, unspecified: Secondary | ICD-10-CM | POA: Diagnosis not present

## 2016-06-27 DIAGNOSIS — Z139 Encounter for screening, unspecified: Secondary | ICD-10-CM | POA: Diagnosis not present

## 2016-06-27 LAB — CBC WITH DIFFERENTIAL/PLATELET
BASOS PCT: 0 %
Basophils Absolute: 0 cells/uL (ref 0–200)
EOS ABS: 104 {cells}/uL (ref 15–500)
Eosinophils Relative: 2 %
HCT: 41.8 % (ref 35.0–45.0)
Hemoglobin: 13.6 g/dL (ref 12.0–15.0)
LYMPHS PCT: 26 %
Lymphs Abs: 1352 cells/uL (ref 850–3900)
MCH: 29.4 pg (ref 27.0–33.0)
MCHC: 32.5 g/dL (ref 32.0–36.0)
MCV: 90.5 fL (ref 80.0–100.0)
MONOS PCT: 5 %
MPV: 10.8 fL (ref 7.5–12.5)
Monocytes Absolute: 260 cells/uL (ref 200–950)
Neutro Abs: 3484 cells/uL (ref 1500–7800)
Neutrophils Relative %: 67 %
PLATELETS: 272 10*3/uL (ref 140–400)
RBC: 4.62 MIL/uL (ref 3.80–5.10)
RDW: 13.5 % (ref 11.0–15.0)
WBC: 5.2 10*3/uL (ref 3.8–10.8)

## 2016-06-28 LAB — HEPATIC FUNCTION PANEL
ALK PHOS: 67 U/L (ref 33–130)
ALT: 23 U/L (ref 6–29)
AST: 21 U/L (ref 10–35)
Albumin: 4.3 g/dL (ref 3.6–5.1)
BILIRUBIN DIRECT: 0.1 mg/dL (ref <=0.2)
BILIRUBIN INDIRECT: 0.4 mg/dL (ref 0.2–1.2)
BILIRUBIN TOTAL: 0.5 mg/dL (ref 0.2–1.2)
Total Protein: 6.7 g/dL (ref 6.1–8.1)

## 2016-06-28 LAB — BASIC METABOLIC PANEL WITH GFR
BUN: 7 mg/dL (ref 7–25)
CHLORIDE: 106 mmol/L (ref 98–110)
CO2: 29 mmol/L (ref 20–31)
CREATININE: 0.6 mg/dL (ref 0.60–0.93)
Calcium: 9.6 mg/dL (ref 8.6–10.4)
GFR, Est Non African American: >89 mL/min (ref >=60)
Glucose, Bld: 112 mg/dL — ABNORMAL HIGH (ref 70–99)
Potassium: 4.4 mmol/L (ref 3.5–5.3)
Sodium: 143 mmol/L (ref 135–146)

## 2016-06-28 LAB — LIPID PANEL
Cholesterol: 172 mg/dL (ref 125–200)
HDL: 56 mg/dL (ref >=46)
LDL Cholesterol: 96 mg/dL (ref <130)
Total CHOL/HDL Ratio: 3.1 Ratio (ref <=5.0)
Triglycerides: 100 mg/dL (ref <150)
VLDL: 20 mg/dL (ref <30)

## 2016-06-28 LAB — VITAMIN D 25 HYDROXY (VIT D DEFICIENCY, FRACTURES): Vit D, 25-Hydroxy: 35 ng/mL (ref 30–100)

## 2016-07-02 ENCOUNTER — Encounter (INDEPENDENT_AMBULATORY_CARE_PROVIDER_SITE_OTHER): Payer: Medicare HMO | Admitting: Ophthalmology

## 2016-07-02 DIAGNOSIS — H35033 Hypertensive retinopathy, bilateral: Secondary | ICD-10-CM

## 2016-07-02 DIAGNOSIS — I10 Essential (primary) hypertension: Secondary | ICD-10-CM

## 2016-07-02 DIAGNOSIS — H34831 Tributary (branch) retinal vein occlusion, right eye, with macular edema: Secondary | ICD-10-CM

## 2016-07-17 ENCOUNTER — Ambulatory Visit (INDEPENDENT_AMBULATORY_CARE_PROVIDER_SITE_OTHER): Payer: Medicare HMO | Admitting: Family Medicine

## 2016-07-17 ENCOUNTER — Encounter: Payer: Self-pay | Admitting: Family Medicine

## 2016-07-17 VITALS — BP 142/69 | HR 72 | Temp 97.2°F | Ht 66.0 in | Wt 103.0 lb

## 2016-07-17 DIAGNOSIS — R0989 Other specified symptoms and signs involving the circulatory and respiratory systems: Secondary | ICD-10-CM

## 2016-07-17 DIAGNOSIS — E784 Other hyperlipidemia: Secondary | ICD-10-CM

## 2016-07-17 DIAGNOSIS — E559 Vitamin D deficiency, unspecified: Secondary | ICD-10-CM | POA: Diagnosis not present

## 2016-07-17 DIAGNOSIS — E7849 Other hyperlipidemia: Secondary | ICD-10-CM

## 2016-07-17 DIAGNOSIS — I10 Essential (primary) hypertension: Secondary | ICD-10-CM

## 2016-07-17 NOTE — Progress Notes (Signed)
Subjective:    [name], female    DOB: 1945-10-12, 70 y.o.   MRN: [id number]  HPI Pt here for follow up and management of chronic medical problems which includes hyperlipidemia and hypertension. She is taking medications regularly.The patient has had lab work done prior to the visit and this will be reviewed with her during the visit. All of her cholesterol numbers were good and at goal with an LDL C cholesterol being 96. Triglycerides 100. All liver function tests were normal. Vitamin D was at the low end of the normal range and we'll make sure that she is taking vitamin D3 1000 daily. The blood sugar slightly elevated at 112 and the electrolytes were good. The creatinine was normal. The CBC was normal with a good hemoglobin. The patient says that her husband says she is not hearing as well. She would like to have her ears checked. She denies any chest pain or shortness of breath. She is swallowing okay and not having any problems with her intestinal tract including nausea vomiting heartburn blood in the stool or black tarry bowel movements. She is passing her water without problems. She says that her blood pressures at home have been running in the 120-150 range over the 70 range. More readings are trending toward the 120s. 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     Patient Active Problem List   Diagnosis Date Noted  . Hypertension 01/11/2014  . Vertigo 01/11/2014  . Hyperlipidemia 01/11/2014   Outpatient Encounter Prescriptions as of 07/17/2016  Medication Sig  . amLODipine (NORVASC) 2.5 MG tablet Take 1 tablet (2.5 mg total) by mouth 2 (two) times daily.  Marland Kitchen atorvastatin (LIPITOR) 20 MG tablet Take 1 tablet (20 mg total) by mouth daily.  Marland Kitchen CINNAMON PO Take 1 capsule by mouth daily.  . COMBIGAN 0.2-0.5 % ophthalmic solution   . dorzolamide (TRUSOPT) 2 % ophthalmic solution   . DUREZOL 0.05 % EMUL   . fluticasone (FLONASE) 50 MCG/ACT nasal spray Place 2  sprays into both nostrils daily.  Marland Kitchen latanoprost (XALATAN) 0.005 % ophthalmic solution   . lisinopril (PRINIVIL,ZESTRIL) 10 MG tablet Take 1 tab in the morning and 2 tabs in the evening  . loteprednol (LOTEMAX) 0.5 % ophthalmic suspension Place 1 drop into the right eye 4 (four) times daily.  . metroNIDAZOLE (METROCREAM) 0.75 % cream Apply topically 2 (two) times daily.  . Omega-3 Fatty Acids (FISH OIL PO) Take 1 capsule by mouth daily.  Marland Kitchen PROLENSA 0.07 % SOLN   . [DISCONTINUED] mupirocin ointment (BACTROBAN) 2 % Place 1 application into the nose 2 (two) times daily. (Patient taking differently: Apply 1 application topically 2 (two) times daily. )   No facility-administered encounter medications on file as of 07/17/2016.      Review of Systems  Constitutional: Negative.   HENT: Negative.        Check ears - hearing is worse  Eyes: Negative.   Respiratory: Negative.   Cardiovascular: Negative.   Gastrointestinal: Negative.   Endocrine: Negative.   Genitourinary: Negative.   Musculoskeletal: Negative.   Skin: Negative.   Allergic/Immunologic: Negative.   Neurological: Negative.   Hematological: Negative.   Psychiatric/Behavioral: Negative.        Objective:   Physical Exam  Constitutional: She is oriented to person, place, and time. She appears well-developed. No distress.  Thin and small framed  HENT:  Head: Normocephalic and atraumatic.  Right Ear: External ear normal.  Left  Ear: External ear normal.  Nose: Nose normal.  Mouth/Throat: Oropharynx is clear and moist.  Ear canals were clear of wax  Eyes: Conjunctivae and EOM are normal. Pupils are equal, round, and reactive to light. Right eye exhibits no discharge. Left eye exhibits no discharge. No scleral icterus.  Neck: Normal range of motion. Neck supple. No JVD present. No thyromegaly present.  No bruits thyromegaly or anterior cervical adenopathy  Cardiovascular: Normal rate, regular rhythm, normal heart sounds and  intact distal pulses.   No murmur heard. Heart has a regular rate and rhythm at 72/m the left radial pulse was diminished. The patient does not describe any claudication symptoms with this extremity. We will check this again at the next visit.  Pulmonary/Chest: Effort normal and breath sounds normal. No respiratory distress. She has no wheezes. She has no rales. She exhibits no tenderness.  Clear anteriorly and posteriorly  Abdominal: Soft. Bowel sounds are normal. She exhibits no mass. There is no tenderness. There is no rebound and no guarding.  No tenderness masses or organ enlargement  Musculoskeletal: Normal range of motion. She exhibits no edema.  Lymphadenopathy:    She has no cervical adenopathy.  Neurological: She is alert and oriented to person, place, and time. She has normal reflexes. No cranial nerve deficit.  Skin: Skin is warm and dry. No rash noted.  Psychiatric: She has a normal mood and affect. Her behavior is normal. Judgment and thought content normal.  Nursing note and vitals reviewed.  BP (!) 142/69 (BP Location: Left Arm)   Pulse 72   Temp 97.2 F (36.2 C) (Oral)   Ht 5\' 6"  (1.676 m)   Wt 103 lb (46.7 kg)   BMI 16.62 kg/m         Assessment & Plan:  1. Essential hypertension -The blood pressure recheck today was 160/90 in the left arm sitting with a regular cuff. I believe this patient has a certain amount of white coat hypertension. Her home blood pressure readings are much better. We will continue to monitor this.  2. Other hyperlipidemia -Cholesterol numbers were good and she will continue with current treatment  3. Vitamin D deficiency -She is encouraged to take the vitamin D more regularly  4. Decreased radial pulse -We will continue to monitor this and I instructed the patient how to check her radial pulses on each side. Patient Instructions                       Medicare Annual Wellness Visit  [facility] and the medical providers at [facility] strive to bring you the best medical care.  In doing so we not only want to address your current medical conditions and concerns but also to detect new conditions early and prevent illness, disease and health-related problems.    Medicare offers a yearly Wellness Visit which allows our clinical staff to assess your need for preventative services including immunizations, lifestyle education, counseling to decrease risk of preventable diseases and screening for fall risk and other medical concerns.    This visit is provided free of charge (no copay) for all Medicare recipients. The clinical pharmacists at [facility] have begun to conduct these Wellness Visits which will also include a thorough review of all your medications.    As you primary medical provider recommend that you make an appointment for your Annual Wellness Visit if you have not done so already this year.  You  may set up this appointment before you leave today or you may call back [phone] and schedule an appointment.  Please make sure when you call that you mention that you are scheduling your Annual Wellness Visit with the clinical pharmacist so that the appointment may be made for the proper length of time.     Continue current medications. Continue good therapeutic lifestyle changes which include good diet and exercise. Fall precautions discussed with patient. If an FOBT was given today- please return it to our front desk. If you are over 71 years old - you may need Prevnar 44 or the adult Pneumonia vaccine.  **Flu shots are available--- please call and schedule a FLU-CLINIC appointment**  After your visit with [place] today you will receive a survey in the mail or online from [employer] regarding your care with [place]. Please take a moment to fill this out. Your feedback is very important to [place] as you can help [place] better understand your patient needs as well as improve your experience and  satisfaction. WE CARE ABOUT YOU!!!   Continue to watch sodium intake and continue to monitor blood pressures at home and bring these readings with you to each visit Drink plenty of fluids and stay well hydrated  Arrie Senate MD

## 2016-07-17 NOTE — Patient Instructions (Addendum)
Medicare Annual Wellness Visit  [facility] and the medical providers at [facility] strive to bring you the best medical care.  In doing so we not only want to address your current medical conditions and concerns but also to detect new conditions early and prevent illness, disease and health-related problems.    Medicare offers a yearly Wellness Visit which allows our clinical staff to assess your need for preventative services including immunizations, lifestyle education, counseling to decrease risk of preventable diseases and screening for fall risk and other medical concerns.    This visit is provided free of charge (no copay) for all Medicare recipients. The clinical pharmacists at [facility] have begun to conduct these Wellness Visits which will also include a thorough review of all your medications.    As you primary medical provider recommend that you make an appointment for your Annual Wellness Visit if you have not done so already this year.  You may set up this appointment before you leave today or you may call back [phone] and schedule an appointment.  Please make sure when you call that you mention that you are scheduling your Annual Wellness Visit with the clinical pharmacist so that the appointment may be made for the proper length of time.     Continue current medications. Continue good therapeutic lifestyle changes which include good diet and exercise. Fall precautions discussed with patient. If an FOBT was given today- please return it to our front desk. If you are over 67 years old - you may need Prevnar 106 or the adult Pneumonia vaccine.  **Flu shots are available--- please call and schedule a FLU-CLINIC appointment**  After your visit with [place] today you will receive a survey in the mail or online from [employer] regarding your care with [place]. Please take a moment to fill this out. Your feedback is very  important to [place] as you can help [place] better understand your patient needs as well as improve your experience and satisfaction. WE CARE ABOUT YOU!!!   Continue to watch sodium intake and continue to monitor blood pressures at home and bring these readings with you to each visit Drink plenty of fluids and stay well hydrated

## 2016-07-30 ENCOUNTER — Encounter (INDEPENDENT_AMBULATORY_CARE_PROVIDER_SITE_OTHER): Payer: Medicare HMO | Admitting: Ophthalmology

## 2016-07-30 DIAGNOSIS — H353131 Nonexudative age-related macular degeneration, bilateral, early dry stage: Secondary | ICD-10-CM | POA: Diagnosis not present

## 2016-07-30 DIAGNOSIS — I10 Essential (primary) hypertension: Secondary | ICD-10-CM

## 2016-07-30 DIAGNOSIS — H34831 Tributary (branch) retinal vein occlusion, right eye, with macular edema: Secondary | ICD-10-CM

## 2016-07-30 DIAGNOSIS — H35033 Hypertensive retinopathy, bilateral: Secondary | ICD-10-CM | POA: Diagnosis not present

## 2016-07-30 DIAGNOSIS — H35343 Macular cyst, hole, or pseudohole, bilateral: Secondary | ICD-10-CM

## 2016-08-06 DIAGNOSIS — R69 Illness, unspecified: Secondary | ICD-10-CM | POA: Diagnosis not present

## 2016-08-27 ENCOUNTER — Encounter (INDEPENDENT_AMBULATORY_CARE_PROVIDER_SITE_OTHER): Payer: Medicare HMO | Admitting: Ophthalmology

## 2016-08-27 DIAGNOSIS — I10 Essential (primary) hypertension: Secondary | ICD-10-CM

## 2016-08-27 DIAGNOSIS — H43813 Vitreous degeneration, bilateral: Secondary | ICD-10-CM

## 2016-08-27 DIAGNOSIS — H353131 Nonexudative age-related macular degeneration, bilateral, early dry stage: Secondary | ICD-10-CM | POA: Diagnosis not present

## 2016-08-27 DIAGNOSIS — H34831 Tributary (branch) retinal vein occlusion, right eye, with macular edema: Secondary | ICD-10-CM

## 2016-08-27 DIAGNOSIS — H35033 Hypertensive retinopathy, bilateral: Secondary | ICD-10-CM | POA: Diagnosis not present

## 2016-08-28 DIAGNOSIS — H40053 Ocular hypertension, bilateral: Secondary | ICD-10-CM | POA: Diagnosis not present

## 2016-08-30 ENCOUNTER — Other Ambulatory Visit: Payer: Self-pay | Admitting: Family Medicine

## 2016-09-03 ENCOUNTER — Other Ambulatory Visit: Payer: Self-pay | Admitting: Family Medicine

## 2016-09-24 ENCOUNTER — Encounter (INDEPENDENT_AMBULATORY_CARE_PROVIDER_SITE_OTHER): Payer: Medicare HMO | Admitting: Ophthalmology

## 2016-09-24 DIAGNOSIS — H33102 Unspecified retinoschisis, left eye: Secondary | ICD-10-CM

## 2016-09-24 DIAGNOSIS — H34831 Tributary (branch) retinal vein occlusion, right eye, with macular edema: Secondary | ICD-10-CM

## 2016-09-24 DIAGNOSIS — H353132 Nonexudative age-related macular degeneration, bilateral, intermediate dry stage: Secondary | ICD-10-CM | POA: Diagnosis not present

## 2016-09-24 DIAGNOSIS — H35343 Macular cyst, hole, or pseudohole, bilateral: Secondary | ICD-10-CM

## 2016-09-24 DIAGNOSIS — I10 Essential (primary) hypertension: Secondary | ICD-10-CM | POA: Diagnosis not present

## 2016-09-24 DIAGNOSIS — H35033 Hypertensive retinopathy, bilateral: Secondary | ICD-10-CM | POA: Diagnosis not present

## 2016-10-21 DIAGNOSIS — Z1231 Encounter for screening mammogram for malignant neoplasm of breast: Secondary | ICD-10-CM | POA: Diagnosis not present

## 2016-10-25 ENCOUNTER — Encounter: Payer: Self-pay | Admitting: Family Medicine

## 2016-10-25 DIAGNOSIS — N6312 Unspecified lump in the right breast, upper inner quadrant: Secondary | ICD-10-CM | POA: Diagnosis not present

## 2016-10-28 DIAGNOSIS — Z961 Presence of intraocular lens: Secondary | ICD-10-CM | POA: Diagnosis not present

## 2016-10-28 DIAGNOSIS — H35353 Cystoid macular degeneration, bilateral: Secondary | ICD-10-CM | POA: Diagnosis not present

## 2016-10-28 DIAGNOSIS — H4063X1 Glaucoma secondary to drugs, bilateral, mild stage: Secondary | ICD-10-CM | POA: Diagnosis not present

## 2016-10-28 DIAGNOSIS — H40053 Ocular hypertension, bilateral: Secondary | ICD-10-CM | POA: Diagnosis not present

## 2016-10-29 ENCOUNTER — Encounter (INDEPENDENT_AMBULATORY_CARE_PROVIDER_SITE_OTHER): Payer: Medicare HMO | Admitting: Ophthalmology

## 2016-10-29 DIAGNOSIS — H35343 Macular cyst, hole, or pseudohole, bilateral: Secondary | ICD-10-CM | POA: Diagnosis not present

## 2016-10-29 DIAGNOSIS — I10 Essential (primary) hypertension: Secondary | ICD-10-CM

## 2016-10-29 DIAGNOSIS — H34831 Tributary (branch) retinal vein occlusion, right eye, with macular edema: Secondary | ICD-10-CM

## 2016-10-29 DIAGNOSIS — H35033 Hypertensive retinopathy, bilateral: Secondary | ICD-10-CM | POA: Diagnosis not present

## 2016-11-25 DIAGNOSIS — Z961 Presence of intraocular lens: Secondary | ICD-10-CM | POA: Diagnosis not present

## 2016-11-25 DIAGNOSIS — H4063X1 Glaucoma secondary to drugs, bilateral, mild stage: Secondary | ICD-10-CM | POA: Diagnosis not present

## 2016-11-25 DIAGNOSIS — H40053 Ocular hypertension, bilateral: Secondary | ICD-10-CM | POA: Diagnosis not present

## 2016-11-25 DIAGNOSIS — H35353 Cystoid macular degeneration, bilateral: Secondary | ICD-10-CM | POA: Diagnosis not present

## 2016-11-26 ENCOUNTER — Encounter (INDEPENDENT_AMBULATORY_CARE_PROVIDER_SITE_OTHER): Payer: Medicare HMO | Admitting: Ophthalmology

## 2016-11-26 DIAGNOSIS — I10 Essential (primary) hypertension: Secondary | ICD-10-CM

## 2016-11-26 DIAGNOSIS — H34831 Tributary (branch) retinal vein occlusion, right eye, with macular edema: Secondary | ICD-10-CM | POA: Diagnosis not present

## 2016-11-26 DIAGNOSIS — H35343 Macular cyst, hole, or pseudohole, bilateral: Secondary | ICD-10-CM | POA: Diagnosis not present

## 2016-11-26 DIAGNOSIS — H35033 Hypertensive retinopathy, bilateral: Secondary | ICD-10-CM

## 2016-12-02 ENCOUNTER — Other Ambulatory Visit: Payer: Self-pay | Admitting: Family Medicine

## 2016-12-03 DIAGNOSIS — L821 Other seborrheic keratosis: Secondary | ICD-10-CM | POA: Diagnosis not present

## 2016-12-03 DIAGNOSIS — D1801 Hemangioma of skin and subcutaneous tissue: Secondary | ICD-10-CM | POA: Diagnosis not present

## 2016-12-03 DIAGNOSIS — L814 Other melanin hyperpigmentation: Secondary | ICD-10-CM | POA: Diagnosis not present

## 2016-12-03 DIAGNOSIS — D235 Other benign neoplasm of skin of trunk: Secondary | ICD-10-CM | POA: Diagnosis not present

## 2016-12-03 DIAGNOSIS — L820 Inflamed seborrheic keratosis: Secondary | ICD-10-CM | POA: Diagnosis not present

## 2016-12-24 ENCOUNTER — Encounter (INDEPENDENT_AMBULATORY_CARE_PROVIDER_SITE_OTHER): Payer: Medicare HMO | Admitting: Ophthalmology

## 2016-12-24 DIAGNOSIS — H34831 Tributary (branch) retinal vein occlusion, right eye, with macular edema: Secondary | ICD-10-CM | POA: Diagnosis not present

## 2016-12-24 DIAGNOSIS — H35033 Hypertensive retinopathy, bilateral: Secondary | ICD-10-CM | POA: Diagnosis not present

## 2016-12-24 DIAGNOSIS — I10 Essential (primary) hypertension: Secondary | ICD-10-CM

## 2016-12-24 DIAGNOSIS — H35343 Macular cyst, hole, or pseudohole, bilateral: Secondary | ICD-10-CM

## 2017-01-06 ENCOUNTER — Other Ambulatory Visit: Payer: Medicare HMO

## 2017-01-06 ENCOUNTER — Telehealth: Payer: Self-pay | Admitting: Family Medicine

## 2017-01-06 DIAGNOSIS — E785 Hyperlipidemia, unspecified: Secondary | ICD-10-CM

## 2017-01-06 DIAGNOSIS — I10 Essential (primary) hypertension: Secondary | ICD-10-CM

## 2017-01-06 NOTE — Telephone Encounter (Signed)
LMOVM that printed lab order was at the front desk

## 2017-01-07 DIAGNOSIS — E785 Hyperlipidemia, unspecified: Secondary | ICD-10-CM | POA: Diagnosis not present

## 2017-01-07 DIAGNOSIS — I10 Essential (primary) hypertension: Secondary | ICD-10-CM | POA: Diagnosis not present

## 2017-01-07 DIAGNOSIS — E559 Vitamin D deficiency, unspecified: Secondary | ICD-10-CM | POA: Diagnosis not present

## 2017-01-07 LAB — CBC WITH DIFFERENTIAL/PLATELET
BASOS ABS: 40 {cells}/uL (ref 0–200)
Basophils Relative: 1 %
EOS ABS: 120 {cells}/uL (ref 15–500)
EOS PCT: 3 %
HCT: 41.6 % (ref 35.0–45.0)
Hemoglobin: 13.4 g/dL (ref 12.0–15.0)
LYMPHS PCT: 35 %
Lymphs Abs: 1400 cells/uL (ref 850–3900)
MCH: 28.9 pg (ref 27.0–33.0)
MCHC: 32.2 g/dL (ref 32.0–36.0)
MCV: 89.7 fL (ref 80.0–100.0)
MONOS PCT: 7 %
MPV: 11.4 fL (ref 7.5–12.5)
Monocytes Absolute: 280 cells/uL (ref 200–950)
NEUTROS ABS: 2160 {cells}/uL (ref 1500–7800)
Neutrophils Relative %: 54 %
PLATELETS: 250 10*3/uL (ref 140–400)
RBC: 4.64 MIL/uL (ref 3.80–5.10)
RDW: 13.2 % (ref 11.0–15.0)
WBC: 4 10*3/uL (ref 3.8–10.8)

## 2017-01-07 LAB — LIPID PANEL
CHOL/HDL RATIO: 2.9 ratio (ref <5.0)
Cholesterol: 150 mg/dL (ref <200)
HDL: 52 mg/dL (ref >50)
LDL Cholesterol: 79 mg/dL (ref <100)
Triglycerides: 94 mg/dL (ref <150)
VLDL: 19 mg/dL (ref <30)

## 2017-01-07 LAB — BASIC METABOLIC PANEL
BUN: 6 mg/dL — AB (ref 7–25)
CO2: 29 mmol/L (ref 20–31)
Calcium: 9.2 mg/dL (ref 8.6–10.4)
Chloride: 106 mmol/L (ref 98–110)
Creat: 0.63 mg/dL (ref 0.60–0.93)
GLUCOSE: 100 mg/dL — AB (ref 70–99)
POTASSIUM: 4.5 mmol/L (ref 3.5–5.3)
SODIUM: 141 mmol/L (ref 135–146)

## 2017-01-07 LAB — HEPATIC FUNCTION PANEL
ALT: 22 U/L (ref 6–29)
AST: 17 U/L (ref 10–35)
Albumin: 3.9 g/dL (ref 3.6–5.1)
Alkaline Phosphatase: 66 U/L (ref 33–130)
BILIRUBIN DIRECT: 0.1 mg/dL (ref <=0.2)
BILIRUBIN INDIRECT: 0.4 mg/dL (ref 0.2–1.2)
BILIRUBIN TOTAL: 0.5 mg/dL (ref 0.2–1.2)
Total Protein: 6.3 g/dL (ref 6.1–8.1)

## 2017-01-08 LAB — VITAMIN D 25 HYDROXY (VIT D DEFICIENCY, FRACTURES): VIT D 25 HYDROXY: 28 ng/mL — AB (ref 30–100)

## 2017-01-21 ENCOUNTER — Encounter (INDEPENDENT_AMBULATORY_CARE_PROVIDER_SITE_OTHER): Payer: Medicare HMO | Admitting: Ophthalmology

## 2017-01-21 DIAGNOSIS — I10 Essential (primary) hypertension: Secondary | ICD-10-CM | POA: Diagnosis not present

## 2017-01-21 DIAGNOSIS — H34831 Tributary (branch) retinal vein occlusion, right eye, with macular edema: Secondary | ICD-10-CM

## 2017-01-21 DIAGNOSIS — H35033 Hypertensive retinopathy, bilateral: Secondary | ICD-10-CM | POA: Diagnosis not present

## 2017-01-21 DIAGNOSIS — H35343 Macular cyst, hole, or pseudohole, bilateral: Secondary | ICD-10-CM | POA: Diagnosis not present

## 2017-01-22 ENCOUNTER — Ambulatory Visit: Payer: Medicare HMO | Admitting: Family Medicine

## 2017-01-27 ENCOUNTER — Encounter: Payer: Self-pay | Admitting: Family Medicine

## 2017-01-27 ENCOUNTER — Ambulatory Visit (INDEPENDENT_AMBULATORY_CARE_PROVIDER_SITE_OTHER): Payer: Medicare HMO | Admitting: Family Medicine

## 2017-01-27 VITALS — BP 178/100 | HR 72 | Temp 97.3°F | Ht 66.0 in | Wt 107.0 lb

## 2017-01-27 DIAGNOSIS — E559 Vitamin D deficiency, unspecified: Secondary | ICD-10-CM | POA: Diagnosis not present

## 2017-01-27 DIAGNOSIS — E785 Hyperlipidemia, unspecified: Secondary | ICD-10-CM | POA: Diagnosis not present

## 2017-01-27 DIAGNOSIS — I10 Essential (primary) hypertension: Secondary | ICD-10-CM

## 2017-01-27 MED ORDER — ATORVASTATIN CALCIUM 20 MG PO TABS: 20.0000 mg | ORAL_TABLET | Freq: Every day | ORAL | 3 refills | Status: DC

## 2017-01-27 MED ORDER — FLUTICASONE PROPIONATE 50 MCG/ACT NA SUSP: 2.0000 | Freq: Every day | NASAL | 6 refills | Status: DC

## 2017-01-27 MED ORDER — LISINOPRIL 20 MG PO TABS: 20.0000 mg | ORAL_TABLET | Freq: Two times a day (BID) | ORAL | 3 refills | Status: DC

## 2017-01-27 MED ORDER — AMLODIPINE BESYLATE 2.5 MG PO TABS: 2.5000 mg | ORAL_TABLET | Freq: Two times a day (BID) | ORAL | 3 refills | Status: DC

## 2017-01-27 NOTE — Progress Notes (Signed)
Subjective:    [name], female    DOB: 04/09/1946, 71 y.o.   MRN: [id number]  HPI Pt here for follow up and management of chronic medical problems which includes hyperlipidemia and hypertension. She is taking medication regularly.The patient has had some trouble with her eyes and she is seeing Dr. Katy Fitch and Dr. Zigmund Daniel tomorrow. She brings in home blood pressures for review and all of these are excellent overall with only one being elevated back in February at 161/91. Initial blood pressure today was elevated and we will recheck that. The patient has had blood work done and this will be reviewed with her during the visit today. The BMP was within normal limits except the blood sugars elevated at 100 with the creatinine being 0.63. The vitamin D level was low and we will reassess how she is taking her vitamin D when we see her. The CBC was within normal limits with a good hemoglobin normal white blood cell count and adequate platelet count. All cholesterol numbers with traditional lipid testing were good and at goal for her. All liver function tests were normal. She is requesting refills on all her medicines. She will be given an FOBT to return. She refuses to do a colonoscopy. The patient indicates that her mother died in 11/28/2022 following a hip fracture and a CVA. 71 years old. Today the patient denies any chest pain shortness of breath trouble swallowing heartburn indigestion nausea vomiting diarrhea blood in the stool or black tarry bowel movements. There is no family history of colon cancer. She is passing her water without problems. She walks and exercises regularly.     Patient Active Problem List   Diagnosis Date Noted  . Hypertension 01/11/2014  . Vertigo 01/11/2014  . Hyperlipidemia 01/11/2014   Outpatient Encounter Prescriptions as of 01/27/2017  Medication Sig  . amLODipine (NORVASC) 2.5 MG tablet TAKE ONE TABLET BY MOUTH TWICE DAILY  . atorvastatin (LIPITOR) 20  MG tablet TAKE ONE TABLET BY MOUTH ONCE DAILY  . CINNAMON PO Take 1 capsule by mouth daily.  . COMBIGAN 0.2-0.5 % ophthalmic solution   . dorzolamide (TRUSOPT) 2 % ophthalmic solution   . fluticasone (FLONASE) 50 MCG/ACT nasal spray Place 2 sprays into both nostrils daily.  Marland Kitchen latanoprost (XALATAN) 0.005 % ophthalmic solution   . lisinopril (PRINIVIL,ZESTRIL) 10 MG tablet TAKE ONE TABLET BY MOUTH IN THE MORNING AND TWO IN THE EVENING  . lisinopril (PRINIVIL,ZESTRIL) 20 MG tablet TAKE ONE TABLET BY MOUTH TWICE DAILY  . metroNIDAZOLE (METROCREAM) 0.75 % cream Apply topically 2 (two) times daily.  . Omega-3 Fatty Acids (FISH OIL PO) Take 1 capsule by mouth daily.  Marland Kitchen PROLENSA 0.07 % SOLN   . [DISCONTINUED] DUREZOL 0.05 % EMUL   . [DISCONTINUED] loteprednol (LOTEMAX) 0.5 % ophthalmic suspension Place 1 drop into the right eye 4 (four) times daily.   No facility-administered encounter medications on file as of 01/27/2017.      Review of Systems  Constitutional: Negative.   HENT: Negative.   Eyes: Negative.        Trouble with eyes - following Dr Zigmund Daniel and Dr Katy Fitch  Respiratory: Negative.   Cardiovascular: Negative.   Gastrointestinal: Negative.   Endocrine: Negative.   Genitourinary: Negative.   Musculoskeletal: Negative.   Skin: Negative.   Allergic/Immunologic: Negative.   Neurological: Negative.   Hematological: Negative.   Psychiatric/Behavioral: Negative.        Objective:   Physical Exam  Constitutional: She is  oriented to person, place, and time. She appears well-developed and well-nourished.  The patient is pleasant and relaxed even though we discussed the loss of her mother and some other family issues that are going on.  HENT:  Head: Normocephalic and atraumatic.  Right Ear: External ear normal.  Left Ear: External ear normal.  Nose: Nose normal.  Mouth/Throat: Oropharynx is clear and moist. No oropharyngeal exudate.  Eyes: Conjunctivae and EOM are normal. Pupils  are equal, round, and reactive to light. Right eye exhibits no discharge. Left eye exhibits no discharge. No scleral icterus.  The patient sees the eye doctor regularly.  Neck: Normal range of motion. Neck supple. No thyromegaly present.  No bruits thyromegaly or anterior cervical adenopathy  Cardiovascular: Normal rate, regular rhythm, normal heart sounds and intact distal pulses.   No murmur heard. The heart is regular at 72/m with good pedal pulses  Pulmonary/Chest: Effort normal and breath sounds normal. No respiratory distress. She has no wheezes. She has no rales. She exhibits no tenderness.  Clear anteriorly and posteriorly  Abdominal: Soft. Bowel sounds are normal. She exhibits no mass. There is no tenderness. There is no rebound and no guarding.  No abdominal tenderness masses bruits organ enlargement  Musculoskeletal: Normal range of motion. She exhibits no edema.  Lymphadenopathy:    She has no cervical adenopathy.  Neurological: She is alert and oriented to person, place, and time. She has normal reflexes. No cranial nerve deficit.  Skin: Skin is warm and dry. No rash noted.  Psychiatric: She has a normal mood and affect. Her behavior is normal. Judgment and thought content normal.  Nursing note and vitals reviewed.   BP (!) 149/73 (BP Location: Left Arm)   Pulse 63   Temp 97.3 F (36.3 C) (Oral)   Ht 5\' 6"  (1.676 m)   Wt 107 lb (48.5 kg)   BMI 17.27 kg/m        Assessment & Plan:  1. Essential hypertension -The blood pressure is good and she should continue with current treatment  2. Hyperlipidemia, unspecified hyperlipidemia type -Continue aggressive therapeutic lifestyle changes and omega-3 fatty acids as well as atorvastatin.  3. Vitamin D deficiency -Take vitamin D regularly and also take 600 mg of calcium twice daily.  Meds ordered this encounter  Medications  . lisinopril (PRINIVIL,ZESTRIL) 20 MG tablet    Sig: Take 1 tablet (20 mg total) by mouth 2  (two) times daily.    Dispense:  180 tablet    Refill:  3  . fluticasone (FLONASE) 50 MCG/ACT nasal spray    Sig: Place 2 sprays into both nostrils daily.    Dispense:  16 g    Refill:  6  . atorvastatin (LIPITOR) 20 MG tablet    Sig: Take 1 tablet (20 mg total) by mouth daily.    Dispense:  90 tablet    Refill:  3  . amLODipine (NORVASC) 2.5 MG tablet    Sig: Take 1 tablet (2.5 mg total) by mouth 2 (two) times daily.    Dispense:  180 tablet    Refill:  3   Patient Instructions                       Medicare Annual Wellness Visit  [facility] and the medical providers at [facility] strive to bring you the best medical care.  In doing so we not only want to address your current medical conditions and concerns but  also to detect new conditions early and prevent illness, disease and health-related problems.    Medicare offers a yearly Wellness Visit which allows our clinical staff to assess your need for preventative services including immunizations, lifestyle education, counseling to decrease risk of preventable diseases and screening for fall risk and other medical concerns.    This visit is provided free of charge (no copay) for all Medicare recipients. The clinical pharmacists at [facility] have begun to conduct these Wellness Visits which will also include a thorough review of all your medications.    As you primary medical provider recommend that you make an appointment for your Annual Wellness Visit if you have not done so already this year.  You may set up this appointment before you leave today or you may call back ([phone]) and schedule an appointment.  Please make sure when you call that you mention that you are scheduling your Annual Wellness Visit with the clinical pharmacist so that the appointment may be made for the proper length of time.     Continue current medications. Continue good therapeutic lifestyle changes which  include good diet and exercise. Fall precautions discussed with patient. If an FOBT was given today- please return it to our front desk. If you are over 47 years old - you may need Prevnar 36 or the adult Pneumonia vaccine.  **Flu shots are available--- please call and schedule a FLU-CLINIC appointment**  After your visit with [place] today you will receive a survey in the mail or online from [employer] regarding your care with [place]. Please take a moment to fill this out. Your feedback is very important to [place] as you can help [place] better understand your patient needs as well as improve your experience and satisfaction. WE CARE ABOUT YOU!!!   The patient should continue with her vitamin D supplementation on a regular basis She should continue with walking and exercising daily and she should always be careful to not put yourself at risk for falling She should continue with as aggressive therapeutic lifestyle changes as possible She should watch her sodium intake as much as possible She should follow-up with her ophthalmologist as planned and please ask him to send [place] a copy of any eye exam reports that would be relevant to our records. Take calcium 600 mg twice daily    Arrie Senate MD

## 2017-01-27 NOTE — Patient Instructions (Addendum)
Medicare Annual Wellness Visit  [facility] and the medical providers at [facility] strive to bring you the best medical care.  In doing so we not only want to address your current medical conditions and concerns but also to detect new conditions early and prevent illness, disease and health-related problems.    Medicare offers a yearly Wellness Visit which allows our clinical staff to assess your need for preventative services including immunizations, lifestyle education, counseling to decrease risk of preventable diseases and screening for fall risk and other medical concerns.    This visit is provided free of charge (no copay) for all Medicare recipients. The clinical pharmacists at [facility] have begun to conduct these Wellness Visits which will also include a thorough review of all your medications.    As you primary medical provider recommend that you make an appointment for your Annual Wellness Visit if you have not done so already this year.  You may set up this appointment before you leave today or you may call back ([phone]) and schedule an appointment.  Please make sure when you call that you mention that you are scheduling your Annual Wellness Visit with the clinical pharmacist so that the appointment may be made for the proper length of time.     Continue current medications. Continue good therapeutic lifestyle changes which include good diet and exercise. Fall precautions discussed with patient. If an FOBT was given today- please return it to our front desk. If you are over 51 years old - you may need Prevnar 66 or the adult Pneumonia vaccine.  **Flu shots are available--- please call and schedule a FLU-CLINIC appointment**  After your visit with [place] today you will receive a survey in the mail or online from [employer] regarding your care with [place]. Please take a moment to fill this out. Your feedback is very  important to [place] as you can help [place] better understand your patient needs as well as improve your experience and satisfaction. WE CARE ABOUT YOU!!!   The patient should continue with her vitamin D supplementation on a regular basis She should continue with walking and exercising daily and she should always be careful to not put yourself at risk for falling She should continue with as aggressive therapeutic lifestyle changes as possible She should watch her sodium intake as much as possible She should follow-up with her ophthalmologist as planned and please ask him to send [place] a copy of any eye exam reports that would be relevant to our records. Take calcium 600 mg twice daily

## 2017-01-28 DIAGNOSIS — H40053 Ocular hypertension, bilateral: Secondary | ICD-10-CM | POA: Diagnosis not present

## 2017-01-28 DIAGNOSIS — H4063X1 Glaucoma secondary to drugs, bilateral, mild stage: Secondary | ICD-10-CM | POA: Diagnosis not present

## 2017-01-28 DIAGNOSIS — Z961 Presence of intraocular lens: Secondary | ICD-10-CM | POA: Diagnosis not present

## 2017-01-28 DIAGNOSIS — H35353 Cystoid macular degeneration, bilateral: Secondary | ICD-10-CM | POA: Diagnosis not present

## 2017-02-04 DIAGNOSIS — H40053 Ocular hypertension, bilateral: Secondary | ICD-10-CM | POA: Diagnosis not present

## 2017-02-04 DIAGNOSIS — H4063X1 Glaucoma secondary to drugs, bilateral, mild stage: Secondary | ICD-10-CM | POA: Diagnosis not present

## 2017-02-04 DIAGNOSIS — Z961 Presence of intraocular lens: Secondary | ICD-10-CM | POA: Diagnosis not present

## 2017-02-04 DIAGNOSIS — H35353 Cystoid macular degeneration, bilateral: Secondary | ICD-10-CM | POA: Diagnosis not present

## 2017-02-18 ENCOUNTER — Encounter (INDEPENDENT_AMBULATORY_CARE_PROVIDER_SITE_OTHER): Payer: Medicare HMO | Admitting: Ophthalmology

## 2017-02-18 DIAGNOSIS — H35343 Macular cyst, hole, or pseudohole, bilateral: Secondary | ICD-10-CM | POA: Diagnosis not present

## 2017-02-18 DIAGNOSIS — I10 Essential (primary) hypertension: Secondary | ICD-10-CM | POA: Diagnosis not present

## 2017-02-18 DIAGNOSIS — H35033 Hypertensive retinopathy, bilateral: Secondary | ICD-10-CM | POA: Diagnosis not present

## 2017-02-18 DIAGNOSIS — H34831 Tributary (branch) retinal vein occlusion, right eye, with macular edema: Secondary | ICD-10-CM

## 2017-02-25 DIAGNOSIS — H35353 Cystoid macular degeneration, bilateral: Secondary | ICD-10-CM | POA: Diagnosis not present

## 2017-02-25 DIAGNOSIS — H4063X1 Glaucoma secondary to drugs, bilateral, mild stage: Secondary | ICD-10-CM | POA: Diagnosis not present

## 2017-02-25 DIAGNOSIS — H40053 Ocular hypertension, bilateral: Secondary | ICD-10-CM | POA: Diagnosis not present

## 2017-02-25 DIAGNOSIS — Z961 Presence of intraocular lens: Secondary | ICD-10-CM | POA: Diagnosis not present

## 2017-03-18 ENCOUNTER — Encounter (INDEPENDENT_AMBULATORY_CARE_PROVIDER_SITE_OTHER): Payer: Medicare HMO | Admitting: Ophthalmology

## 2017-03-18 DIAGNOSIS — H35343 Macular cyst, hole, or pseudohole, bilateral: Secondary | ICD-10-CM

## 2017-03-18 DIAGNOSIS — H35033 Hypertensive retinopathy, bilateral: Secondary | ICD-10-CM

## 2017-03-18 DIAGNOSIS — I10 Essential (primary) hypertension: Secondary | ICD-10-CM

## 2017-03-18 DIAGNOSIS — H34831 Tributary (branch) retinal vein occlusion, right eye, with macular edema: Secondary | ICD-10-CM

## 2017-04-29 ENCOUNTER — Encounter (INDEPENDENT_AMBULATORY_CARE_PROVIDER_SITE_OTHER): Payer: Medicare HMO | Admitting: Ophthalmology

## 2017-04-29 DIAGNOSIS — H35033 Hypertensive retinopathy, bilateral: Secondary | ICD-10-CM

## 2017-04-29 DIAGNOSIS — H35343 Macular cyst, hole, or pseudohole, bilateral: Secondary | ICD-10-CM

## 2017-04-29 DIAGNOSIS — H34831 Tributary (branch) retinal vein occlusion, right eye, with macular edema: Secondary | ICD-10-CM

## 2017-04-29 DIAGNOSIS — I10 Essential (primary) hypertension: Secondary | ICD-10-CM

## 2017-06-10 ENCOUNTER — Encounter (INDEPENDENT_AMBULATORY_CARE_PROVIDER_SITE_OTHER): Payer: Medicare HMO | Admitting: Ophthalmology

## 2017-06-10 DIAGNOSIS — I10 Essential (primary) hypertension: Secondary | ICD-10-CM | POA: Diagnosis not present

## 2017-06-10 DIAGNOSIS — H353112 Nonexudative age-related macular degeneration, right eye, intermediate dry stage: Secondary | ICD-10-CM | POA: Diagnosis not present

## 2017-06-10 DIAGNOSIS — H35033 Hypertensive retinopathy, bilateral: Secondary | ICD-10-CM | POA: Diagnosis not present

## 2017-06-10 DIAGNOSIS — H35343 Macular cyst, hole, or pseudohole, bilateral: Secondary | ICD-10-CM | POA: Diagnosis not present

## 2017-06-10 DIAGNOSIS — H353121 Nonexudative age-related macular degeneration, left eye, early dry stage: Secondary | ICD-10-CM

## 2017-06-10 DIAGNOSIS — H34831 Tributary (branch) retinal vein occlusion, right eye, with macular edema: Secondary | ICD-10-CM

## 2017-06-16 DIAGNOSIS — Z961 Presence of intraocular lens: Secondary | ICD-10-CM | POA: Diagnosis not present

## 2017-06-16 DIAGNOSIS — H4063X1 Glaucoma secondary to drugs, bilateral, mild stage: Secondary | ICD-10-CM | POA: Diagnosis not present

## 2017-06-16 DIAGNOSIS — H40053 Ocular hypertension, bilateral: Secondary | ICD-10-CM | POA: Diagnosis not present

## 2017-06-16 DIAGNOSIS — H35353 Cystoid macular degeneration, bilateral: Secondary | ICD-10-CM | POA: Diagnosis not present

## 2017-07-15 ENCOUNTER — Telehealth: Payer: Self-pay | Admitting: Family Medicine

## 2017-07-15 DIAGNOSIS — E559 Vitamin D deficiency, unspecified: Secondary | ICD-10-CM

## 2017-07-15 DIAGNOSIS — E7800 Pure hypercholesterolemia, unspecified: Secondary | ICD-10-CM

## 2017-07-15 DIAGNOSIS — I10 Essential (primary) hypertension: Secondary | ICD-10-CM

## 2017-07-15 NOTE — Telephone Encounter (Signed)
Orders printed

## 2017-07-16 ENCOUNTER — Other Ambulatory Visit: Payer: Self-pay | Admitting: Family Medicine

## 2017-07-16 DIAGNOSIS — E559 Vitamin D deficiency, unspecified: Secondary | ICD-10-CM | POA: Diagnosis not present

## 2017-07-16 DIAGNOSIS — E7800 Pure hypercholesterolemia, unspecified: Secondary | ICD-10-CM | POA: Diagnosis not present

## 2017-07-16 DIAGNOSIS — I10 Essential (primary) hypertension: Secondary | ICD-10-CM | POA: Diagnosis not present

## 2017-07-17 LAB — CBC WITH DIFFERENTIAL/PLATELET
BASOS ABS: 21 {cells}/uL (ref 0–200)
Basophils Relative: 0.5 %
EOS ABS: 119 {cells}/uL (ref 15–500)
Eosinophils Relative: 2.9 %
HCT: 40.2 % (ref 35.0–45.0)
HEMOGLOBIN: 13.5 g/dL (ref 11.7–15.5)
Lymphs Abs: 1210 cells/uL (ref 850–3900)
MCH: 29.6 pg (ref 27.0–33.0)
MCHC: 33.6 g/dL (ref 32.0–36.0)
MCV: 88.2 fL (ref 80.0–100.0)
MONOS PCT: 5.3 %
MPV: 11.9 fL (ref 7.5–12.5)
NEUTROS PCT: 61.8 %
Neutro Abs: 2534 cells/uL (ref 1500–7800)
PLATELETS: 221 10*3/uL (ref 140–400)
RBC: 4.56 10*6/uL (ref 3.80–5.10)
RDW: 11.8 % (ref 11.0–15.0)
TOTAL LYMPHOCYTE: 29.5 %
WBC mixed population: 217 cells/uL (ref 200–950)
WBC: 4.1 10*3/uL (ref 3.8–10.8)

## 2017-07-17 LAB — HEPATIC FUNCTION PANEL
AG RATIO: 1.7 (calc) (ref 1.0–2.5)
ALKALINE PHOSPHATASE (APISO): 68 U/L (ref 33–130)
ALT: 18 U/L (ref 6–29)
AST: 16 U/L (ref 10–35)
Albumin: 4.1 g/dL (ref 3.6–5.1)
BILIRUBIN DIRECT: 0.1 mg/dL (ref 0.0–0.2)
BILIRUBIN INDIRECT: 0.4 mg/dL (ref 0.2–1.2)
BILIRUBIN TOTAL: 0.5 mg/dL (ref 0.2–1.2)
Globulin: 2.4 g/dL (calc) (ref 1.9–3.7)
Total Protein: 6.5 g/dL (ref 6.1–8.1)

## 2017-07-17 LAB — BASIC METABOLIC PANEL WITH GFR
BUN: 7 mg/dL (ref 7–25)
CO2: 32 mmol/L (ref 20–32)
Calcium: 9.4 mg/dL (ref 8.6–10.4)
Chloride: 107 mmol/L (ref 98–110)
Creat: 0.63 mg/dL (ref 0.60–0.93)
GFR, EST AFRICAN AMERICAN: 104 mL/min/{1.73_m2} (ref >=60)
GFR, EST NON AFRICAN AMERICAN: 90 mL/min/{1.73_m2} (ref >=60)
Glucose, Bld: 102 mg/dL — ABNORMAL HIGH (ref 65–99)
POTASSIUM: 4.3 mmol/L (ref 3.5–5.3)
SODIUM: 144 mmol/L (ref 135–146)

## 2017-07-17 LAB — LIPID PANEL
CHOL/HDL RATIO: 2.4 (calc) (ref <5.0)
CHOLESTEROL: 151 mg/dL (ref <200)
HDL: 63 mg/dL (ref >50)
LDL CHOLESTEROL (CALC): 72 mg/dL
NON-HDL CHOLESTEROL (CALC): 88 mg/dL (ref <130)
TRIGLYCERIDES: 81 mg/dL (ref <150)

## 2017-07-17 LAB — VITAMIN D 25 HYDROXY (VIT D DEFICIENCY, FRACTURES): Vit D, 25-Hydroxy: 45 ng/mL (ref 30–100)

## 2017-07-21 ENCOUNTER — Encounter (INDEPENDENT_AMBULATORY_CARE_PROVIDER_SITE_OTHER): Payer: Medicare HMO | Admitting: Ophthalmology

## 2017-07-21 DIAGNOSIS — H34831 Tributary (branch) retinal vein occlusion, right eye, with macular edema: Secondary | ICD-10-CM

## 2017-07-21 DIAGNOSIS — H35033 Hypertensive retinopathy, bilateral: Secondary | ICD-10-CM | POA: Diagnosis not present

## 2017-07-21 DIAGNOSIS — I10 Essential (primary) hypertension: Secondary | ICD-10-CM

## 2017-07-21 DIAGNOSIS — H35343 Macular cyst, hole, or pseudohole, bilateral: Secondary | ICD-10-CM

## 2017-07-28 DIAGNOSIS — H35353 Cystoid macular degeneration, bilateral: Secondary | ICD-10-CM | POA: Diagnosis not present

## 2017-07-28 DIAGNOSIS — H4063X1 Glaucoma secondary to drugs, bilateral, mild stage: Secondary | ICD-10-CM | POA: Diagnosis not present

## 2017-07-28 DIAGNOSIS — Z961 Presence of intraocular lens: Secondary | ICD-10-CM | POA: Diagnosis not present

## 2017-07-28 DIAGNOSIS — H40053 Ocular hypertension, bilateral: Secondary | ICD-10-CM | POA: Diagnosis not present

## 2017-07-30 ENCOUNTER — Ambulatory Visit (INDEPENDENT_AMBULATORY_CARE_PROVIDER_SITE_OTHER): Payer: Medicare HMO | Admitting: Family Medicine

## 2017-07-30 ENCOUNTER — Encounter: Payer: Self-pay | Admitting: Family Medicine

## 2017-07-30 VITALS — BP 162/100 | HR 69 | Temp 96.7°F | Ht 66.0 in | Wt 105.0 lb

## 2017-07-30 DIAGNOSIS — E559 Vitamin D deficiency, unspecified: Secondary | ICD-10-CM

## 2017-07-30 DIAGNOSIS — I10 Essential (primary) hypertension: Secondary | ICD-10-CM

## 2017-07-30 DIAGNOSIS — M4000 Postural kyphosis, site unspecified: Secondary | ICD-10-CM

## 2017-07-30 DIAGNOSIS — E7800 Pure hypercholesterolemia, unspecified: Secondary | ICD-10-CM

## 2017-07-30 NOTE — Patient Instructions (Addendum)
Medicare Annual Wellness Visit  [facility] and the medical providers at [facility] strive to bring you the best medical care.  In doing so we not only want to address your current medical conditions and concerns but also to detect new conditions early and prevent illness, disease and health-related problems.    Medicare offers a yearly Wellness Visit which allows our clinical staff to assess your need for preventative services including immunizations, lifestyle education, counseling to decrease risk of preventable diseases and screening for fall risk and other medical concerns.    This visit is provided free of charge (no copay) for all Medicare recipients. The clinical pharmacists at [facility] have begun to conduct these Wellness Visits which will also include a thorough review of all your medications.    As you primary medical provider recommend that you make an appointment for your Annual Wellness Visit if you have not done so already this year.  You may set up this appointment before you leave today or you may call back ([phone]) and schedule an appointment.  Please make sure when you call that you mention that you are scheduling your Annual Wellness Visit with the clinical pharmacist so that the appointment may be made for the proper length of time.     Continue current medications. Continue good therapeutic lifestyle changes which include good diet and exercise. Fall precautions discussed with patient. If an FOBT was given today- please return it to our front desk. If you are over 41 years old - you may need Prevnar 36 or the adult Pneumonia vaccine.  **Flu shots are available--- please call and schedule a FLU-CLINIC appointment**  After your visit with [place] today you will receive a survey in the mail or online from [employer] regarding your care with [place]. Please take a moment to fill this out. Your feedback is very  important to [place] as you can help [place] better understand your patient needs as well as improve your experience and satisfaction. WE CARE ABOUT YOU!!!  Consider getting a pelvic exam every couple of years Bring blood pressure monitor by to confirm readings from home with readings in our office Continue to walk and exercise daily and take calcium and vitamin D for bones

## 2017-07-30 NOTE — Progress Notes (Signed)
Subjective:    [name], female    DOB: 10-19-1945, 71 y.o.   MRN: [id number]  HPI Pt here for follow up and management of chronic medical problems which includes hyperlipidemia and hypertension. She is taking medication regularly.  The patient comes to the visit today for recheck of her blood pressure and to review her lab work.  She has no specific complaints.  She brings in home blood pressures for review and all of these are excellent.  For some reason when she comes to the office the blood pressures are always higher.  We will not change any of the medicine based on the readings in the office.  The reading here today was 162/79.  She has had lab work done and this will be reviewed with her during the visit.  All of the liver function tests were normal.  Cholesterol numbers with traditional lipid testing had an LDL see cholesterol that was excellent at 72 and triglycerides are good at 81 with a good cholesterol or HDL at 63.  Blood sugar was slightly elevated at 102.  The creatinine and all of the electrolytes including potassium are good.  The vitamin D level was also improved from 6 months ago and is now 56.  CBC was within normal limits with a good hemoglobin that was stable and normal white blood cell count and adequate platelet count.  Patient denies any chest pain or shortness of breath.  She denies any trouble with swallowing heartburn indigestion nausea vomiting diarrhea.  She is passing her water without problems.  She does not want to get a pelvic exam but she was encouraged by me to get one just for routine purposes and good health management.  She will think about this and consider doing this.  She will continue to check blood pressures at home.  The reading by me today was also elevated but all of her readings from home were good and we will not make any changes in her treatment.      Patient Active Problem List   Diagnosis Date Noted  . Hypertension 01/11/2014  .  Vertigo 01/11/2014  . Hyperlipidemia 01/11/2014   Outpatient Encounter Prescriptions as of 07/30/2017  Medication Sig  . amLODipine (NORVASC) 2.5 MG tablet Take 1 tablet (2.5 mg total) by mouth 2 (two) times daily.  Marland Kitchen atorvastatin (LIPITOR) 20 MG tablet Take 1 tablet (20 mg total) by mouth daily.  Marland Kitchen CINNAMON PO Take 1 capsule by mouth daily.  . COMBIGAN 0.2-0.5 % ophthalmic solution   . dorzolamide (TRUSOPT) 2 % ophthalmic solution   . fluticasone (FLONASE) 50 MCG/ACT nasal spray Place 2 sprays into both nostrils daily.  Marland Kitchen latanoprost (XALATAN) 0.005 % ophthalmic solution   . lisinopril (PRINIVIL,ZESTRIL) 10 MG tablet TAKE ONE TABLET BY MOUTH IN THE MORNING AND TWO IN THE EVENING  . lisinopril (PRINIVIL,ZESTRIL) 20 MG tablet Take 1 tablet (20 mg total) by mouth 2 (two) times daily.  . metroNIDAZOLE (METROCREAM) 0.75 % cream Apply topically 2 (two) times daily.  . Omega-3 Fatty Acids (FISH OIL PO) Take 1 capsule by mouth daily.  Marland Kitchen PROLENSA 0.07 % SOLN    No facility-administered encounter medications on file as of 07/30/2017.      Review of Systems  Constitutional: Negative.   HENT: Negative.   Eyes: Negative.   Respiratory: Negative.   Cardiovascular: Negative.   Gastrointestinal: Negative.   Endocrine: Negative.   Genitourinary: Negative.   Musculoskeletal: Negative.   Skin:  Negative.   Allergic/Immunologic: Negative.   Neurological: Negative.   Hematological: Negative.   Psychiatric/Behavioral: Negative.        Objective:   Physical Exam  Constitutional: She is oriented to person, place, and time. She appears well-developed. No distress.  The patient is pleasant and alert and somewhat older appearing than her stated age.  She brings in blood pressures for review and all of these were excellent at home.  She just got a new monitor and she will bring that monitor by and have it compared to the reading here in the office at some point in the future.  HENT:  Head:  Normocephalic and atraumatic.  Right Ear: External ear normal.  Left Ear: External ear normal.  Nose: Nose normal.  Mouth/Throat: Oropharynx is clear and moist.  Eyes: Pupils are equal, round, and reactive to light. Conjunctivae and EOM are normal. Right eye exhibits no discharge. Left eye exhibits no discharge. No scleral icterus.  Neck: Normal range of motion. Neck supple. No thyromegaly present.  No bruits thyromegaly or anterior cervical adenopathy  Cardiovascular: Normal rate, regular rhythm, normal heart sounds and intact distal pulses.   No murmur heard. Heart is regular at 72/min  Pulmonary/Chest: Effort normal and breath sounds normal. No respiratory distress. She has no wheezes. She has no rales.  Clear anteriorly and posteriorly  Abdominal: Soft. Bowel sounds are normal. She exhibits no mass. There is no tenderness. There is no rebound and no guarding.  No abdominal tenderness masses organs enlargement bruits or inguinal adenopathy  Musculoskeletal: Normal range of motion. She exhibits no edema.  Slightly kyphotic in posture  Lymphadenopathy:    She has no cervical adenopathy.  Neurological: She is alert and oriented to person, place, and time. She has normal reflexes. No cranial nerve deficit.  Skin: Skin is warm and dry. No rash noted.  Psychiatric: She has a normal mood and affect. Her behavior is normal. Judgment and thought content normal.  Nursing note and vitals reviewed.   BP (!) 162/79 (BP Location: Left Arm)   Pulse 69   Temp (!) 96.7 F (35.9 C) (Oral)   Ht 5\' 6"  (1.676 m)   Wt 105 lb (47.6 kg)   BMI 16.95 kg/m        Assessment & Plan:  1. Essential hypertension -Blood pressure readings were brought in for review and she will continue to monitor these blood pressure readings at home.  She will continue to watch sodium intake and stay as active physically as possible  2. Vitamin D deficiency -Vitamin D level was good and she will continue with current  treatment  3. Pure hypercholesterolemia -All cholesterol numbers were excellent and she will continue with current treatment and therapeutic lifestyle changes  4. Postural kyphosis, unspecified spinal region -Try to maintain good posture.  Walking exercise regularly and take vitamin D and calcium to keep bones strong  Patient Instructions                       Medicare Annual Wellness Visit  [facility] and the medical providers at [facility] strive to bring you the best medical care.  In doing so we not only want to address your current medical conditions and concerns but also to detect new conditions early and prevent illness, disease and health-related problems.    Medicare offers a yearly Wellness Visit which allows our clinical staff to assess your need for preventative services including immunizations, lifestyle  education, counseling to decrease risk of preventable diseases and screening for fall risk and other medical concerns.    This visit is provided free of charge (no copay) for all Medicare recipients. The clinical pharmacists at [facility] have begun to conduct these Wellness Visits which will also include a thorough review of all your medications.    As you primary medical provider recommend that you make an appointment for your Annual Wellness Visit if you have not done so already this year.  You may set up this appointment before you leave today or you may call back ([phone]) and schedule an appointment.  Please make sure when you call that you mention that you are scheduling your Annual Wellness Visit with the clinical pharmacist so that the appointment may be made for the proper length of time.     Continue current medications. Continue good therapeutic lifestyle changes which include good diet and exercise. Fall precautions discussed with patient. If an FOBT was given today- please return it to our front desk. If you are over  50 years old - you may need Prevnar 24 or the adult Pneumonia vaccine.  **Flu shots are available--- please call and schedule a FLU-CLINIC appointment**  After your visit with [place] today you will receive a survey in the mail or online from [employer] regarding your care with [place]. Please take a moment to fill this out. Your feedback is very important to [place] as you can help [place] better understand your patient needs as well as improve your experience and satisfaction. WE CARE ABOUT YOU!!!  Consider getting a pelvic exam every couple of years Bring blood pressure monitor by to confirm readings from home with readings in our office Continue to walk and exercise daily and take calcium and vitamin D for bones   Arrie Senate MD

## 2017-08-05 DIAGNOSIS — R69 Illness, unspecified: Secondary | ICD-10-CM | POA: Diagnosis not present

## 2017-08-06 DIAGNOSIS — H40053 Ocular hypertension, bilateral: Secondary | ICD-10-CM | POA: Diagnosis not present

## 2017-08-06 DIAGNOSIS — Z961 Presence of intraocular lens: Secondary | ICD-10-CM | POA: Diagnosis not present

## 2017-08-06 DIAGNOSIS — H4063X1 Glaucoma secondary to drugs, bilateral, mild stage: Secondary | ICD-10-CM | POA: Diagnosis not present

## 2017-08-06 DIAGNOSIS — H35353 Cystoid macular degeneration, bilateral: Secondary | ICD-10-CM | POA: Diagnosis not present

## 2017-09-03 DIAGNOSIS — H4063X1 Glaucoma secondary to drugs, bilateral, mild stage: Secondary | ICD-10-CM | POA: Diagnosis not present

## 2017-09-03 DIAGNOSIS — H35353 Cystoid macular degeneration, bilateral: Secondary | ICD-10-CM | POA: Diagnosis not present

## 2017-09-03 DIAGNOSIS — Z961 Presence of intraocular lens: Secondary | ICD-10-CM | POA: Diagnosis not present

## 2017-09-03 DIAGNOSIS — H40053 Ocular hypertension, bilateral: Secondary | ICD-10-CM | POA: Diagnosis not present

## 2017-09-04 ENCOUNTER — Encounter (INDEPENDENT_AMBULATORY_CARE_PROVIDER_SITE_OTHER): Payer: Medicare HMO | Admitting: Ophthalmology

## 2017-09-04 DIAGNOSIS — I10 Essential (primary) hypertension: Secondary | ICD-10-CM

## 2017-09-04 DIAGNOSIS — H35343 Macular cyst, hole, or pseudohole, bilateral: Secondary | ICD-10-CM

## 2017-09-04 DIAGNOSIS — H34831 Tributary (branch) retinal vein occlusion, right eye, with macular edema: Secondary | ICD-10-CM | POA: Diagnosis not present

## 2017-09-04 DIAGNOSIS — H35033 Hypertensive retinopathy, bilateral: Secondary | ICD-10-CM

## 2017-09-18 ENCOUNTER — Other Ambulatory Visit: Payer: Self-pay | Admitting: Family Medicine

## 2017-10-16 ENCOUNTER — Encounter (INDEPENDENT_AMBULATORY_CARE_PROVIDER_SITE_OTHER): Payer: Medicare HMO | Admitting: Ophthalmology

## 2017-10-16 DIAGNOSIS — H34831 Tributary (branch) retinal vein occlusion, right eye, with macular edema: Secondary | ICD-10-CM

## 2017-10-16 DIAGNOSIS — H35033 Hypertensive retinopathy, bilateral: Secondary | ICD-10-CM

## 2017-10-16 DIAGNOSIS — H35343 Macular cyst, hole, or pseudohole, bilateral: Secondary | ICD-10-CM | POA: Diagnosis not present

## 2017-10-16 DIAGNOSIS — I10 Essential (primary) hypertension: Secondary | ICD-10-CM

## 2017-10-17 ENCOUNTER — Encounter (INDEPENDENT_AMBULATORY_CARE_PROVIDER_SITE_OTHER): Payer: Medicare HMO | Admitting: Ophthalmology

## 2017-10-17 DIAGNOSIS — H34831 Tributary (branch) retinal vein occlusion, right eye, with macular edema: Secondary | ICD-10-CM

## 2017-10-28 DIAGNOSIS — H4063X1 Glaucoma secondary to drugs, bilateral, mild stage: Secondary | ICD-10-CM | POA: Diagnosis not present

## 2017-10-28 DIAGNOSIS — H40053 Ocular hypertension, bilateral: Secondary | ICD-10-CM | POA: Diagnosis not present

## 2017-10-28 DIAGNOSIS — Z961 Presence of intraocular lens: Secondary | ICD-10-CM | POA: Diagnosis not present

## 2017-10-28 DIAGNOSIS — H35353 Cystoid macular degeneration, bilateral: Secondary | ICD-10-CM | POA: Diagnosis not present

## 2017-10-31 DIAGNOSIS — Z1231 Encounter for screening mammogram for malignant neoplasm of breast: Secondary | ICD-10-CM | POA: Diagnosis not present

## 2017-11-25 ENCOUNTER — Ambulatory Visit: Payer: Medicare HMO | Admitting: Family Medicine

## 2017-11-25 DIAGNOSIS — Z961 Presence of intraocular lens: Secondary | ICD-10-CM | POA: Diagnosis not present

## 2017-11-25 DIAGNOSIS — H4063X1 Glaucoma secondary to drugs, bilateral, mild stage: Secondary | ICD-10-CM | POA: Diagnosis not present

## 2017-11-25 DIAGNOSIS — B0239 Other herpes zoster eye disease: Secondary | ICD-10-CM | POA: Diagnosis not present

## 2017-11-25 DIAGNOSIS — H40053 Ocular hypertension, bilateral: Secondary | ICD-10-CM | POA: Diagnosis not present

## 2017-11-25 DIAGNOSIS — H35353 Cystoid macular degeneration, bilateral: Secondary | ICD-10-CM | POA: Diagnosis not present

## 2017-11-25 NOTE — Progress Notes (Deleted)
Subjective: CC: ?shingles PCP: Chipper Herb, MD [id number] Mckenzie [name] is a 72 y.o. female presenting to clinic today for:  1. Shingles like rash ***   ROS: Per HPI  Allergies  Allergen Reactions  . Aldomet [Methyldopa] Other (See Comments)    flu  . Fosamax [Alendronate Sodium]   . Acetazolamide Rash  . Buspar [Buspirone] Palpitations   Past Medical History:  Diagnosis Date  . Depression   . High cholesterol   . Hypertension   . Retinal micro-aneurysm of right eye     Current Outpatient Medications:  .  amLODipine (NORVASC) 2.5 MG tablet, Take 1 tablet (2.5 mg total) by mouth 2 (two) times daily., Disp: 180 tablet, Rfl: 3 .  atorvastatin (LIPITOR) 20 MG tablet, Take 1 tablet (20 mg total) by mouth daily., Disp: 90 tablet, Rfl: 3 .  CINNAMON PO, Take 1 capsule by mouth daily., Disp: , Rfl:  .  COMBIGAN 0.2-0.5 % ophthalmic solution, , Disp: , Rfl:  .  dorzolamide (TRUSOPT) 2 % ophthalmic solution, , Disp: , Rfl:  .  fluorometholone (FML) 0.1 % ophthalmic suspension, Place 1 drop into the right eye 4 (four) times daily., Disp: , Rfl:  .  fluticasone (FLONASE) 50 MCG/ACT nasal spray, Place 2 sprays into both nostrils daily., Disp: 16 g, Rfl: 6 .  latanoprost (XALATAN) 0.005 % ophthalmic solution, , Disp: , Rfl:  .  lisinopril (PRINIVIL,ZESTRIL) 10 MG tablet, TAKE 1 TABLET BY MOUTH ONCE DAILY IN THE MORNING, AND TAKE 2 TABLETS ONCE DAILY IN THE EVENING, Disp: 270 tablet, Rfl: 1 .  lisinopril (PRINIVIL,ZESTRIL) 20 MG tablet, Take 1 tablet (20 mg total) by mouth 2 (two) times daily., Disp: 180 tablet, Rfl: 3 .  metroNIDAZOLE (METROCREAM) 0.75 % cream, Apply topically 2 (two) times daily., Disp: 45 g, Rfl: 3 .  Omega-3 Fatty Acids (FISH OIL PO), Take 1 capsule by mouth daily., Disp: , Rfl:  .  PROLENSA 0.07 % SOLN, , Disp: , Rfl:  Social History   Socioeconomic History  . Marital status: Married    Spouse name: Not on file  . Number of children: Not on file  . Years of  education: Not on file  . Highest education level: Not on file  Social Needs  . Financial resource strain: Not on file  . Food insecurity - worry: Not on file  . Food insecurity - inability: Not on file  . Transportation needs - medical: Not on file  . Transportation needs - non-medical: Not on file  Occupational History  . Not on file  Tobacco Use  . Smoking status: Never Smoker  . Smokeless tobacco: Never Used  Substance and Sexual Activity  . Alcohol use: No  . Drug use: No  . Sexual activity: Not on file  Other Topics Concern  . Not on file  Social History Narrative  . Not on file   Family History  Problem Relation Age of Onset  . Hyperlipidemia Mother   . Hypertension Mother   . Diabetes Mother   . COPD Father     Objective: Office vital signs reviewed. There were no vitals taken for this visit.  Physical Examination:  General: Awake, alert, *** nourished, No acute distress HEENT: Normal    Neck: No masses palpated. No lymphadenopathy    Ears: Tympanic membranes intact, normal light reflex, no erythema, no bulging    Eyes: PERRLA, extraocular membranes intact, sclera ***    Nose: nasal turbinates moist, *** nasal discharge  Throat: moist mucus membranes, no erythema, *** tonsillar exudate.  Airway is patent Cardio: regular rate and rhythm, S1S2 heard, no murmurs appreciated Pulm: clear to auscultation bilaterally, no wheezes, rhonchi or rales; normal work of breathing on room air GI: soft, non-tender, non-distended, bowel sounds present x4, no hepatomegaly, no splenomegaly, no masses GU: external vaginal tissue ***, cervix ***, *** punctate lesions on cervix appreciated, *** discharge from cervical os, *** bleeding, *** cervical motion tenderness, *** abdominal/ adnexal masses Extremities: warm, well perfused, No edema, cyanosis or clubbing; +*** pulses bilaterally MSK: *** gait and *** station Skin: dry; intact; no rashes or lesions Neuro: *** Strength and  light touch sensation grossly intact, *** DTRs ***/4  Assessment/ Plan: 72 y.o. female   ***  No orders of the defined types were placed in this encounter.  No orders of the defined types were placed in this encounter.    Mckenzie Norlander, DO [facility] [phone]

## 2017-11-27 ENCOUNTER — Encounter (INDEPENDENT_AMBULATORY_CARE_PROVIDER_SITE_OTHER): Payer: Medicare HMO | Admitting: Ophthalmology

## 2017-12-02 DIAGNOSIS — H35353 Cystoid macular degeneration, bilateral: Secondary | ICD-10-CM | POA: Diagnosis not present

## 2017-12-02 DIAGNOSIS — Z961 Presence of intraocular lens: Secondary | ICD-10-CM | POA: Diagnosis not present

## 2017-12-02 DIAGNOSIS — H40053 Ocular hypertension, bilateral: Secondary | ICD-10-CM | POA: Diagnosis not present

## 2017-12-02 DIAGNOSIS — H4063X1 Glaucoma secondary to drugs, bilateral, mild stage: Secondary | ICD-10-CM | POA: Diagnosis not present

## 2017-12-02 DIAGNOSIS — B0239 Other herpes zoster eye disease: Secondary | ICD-10-CM | POA: Diagnosis not present

## 2017-12-17 DIAGNOSIS — H35353 Cystoid macular degeneration, bilateral: Secondary | ICD-10-CM | POA: Diagnosis not present

## 2017-12-17 DIAGNOSIS — B0239 Other herpes zoster eye disease: Secondary | ICD-10-CM | POA: Diagnosis not present

## 2017-12-17 DIAGNOSIS — H40053 Ocular hypertension, bilateral: Secondary | ICD-10-CM | POA: Diagnosis not present

## 2017-12-17 DIAGNOSIS — H4063X1 Glaucoma secondary to drugs, bilateral, mild stage: Secondary | ICD-10-CM | POA: Diagnosis not present

## 2017-12-17 DIAGNOSIS — Z961 Presence of intraocular lens: Secondary | ICD-10-CM | POA: Diagnosis not present

## 2017-12-29 ENCOUNTER — Encounter (INDEPENDENT_AMBULATORY_CARE_PROVIDER_SITE_OTHER): Payer: Medicare HMO | Admitting: Ophthalmology

## 2017-12-29 DIAGNOSIS — H35033 Hypertensive retinopathy, bilateral: Secondary | ICD-10-CM | POA: Diagnosis not present

## 2017-12-29 DIAGNOSIS — I10 Essential (primary) hypertension: Secondary | ICD-10-CM | POA: Diagnosis not present

## 2017-12-29 DIAGNOSIS — H34831 Tributary (branch) retinal vein occlusion, right eye, with macular edema: Secondary | ICD-10-CM | POA: Diagnosis not present

## 2018-01-09 ENCOUNTER — Other Ambulatory Visit: Payer: Self-pay | Admitting: *Deleted

## 2018-01-09 DIAGNOSIS — E7800 Pure hypercholesterolemia, unspecified: Secondary | ICD-10-CM

## 2018-01-09 DIAGNOSIS — E559 Vitamin D deficiency, unspecified: Secondary | ICD-10-CM

## 2018-01-09 DIAGNOSIS — I10 Essential (primary) hypertension: Secondary | ICD-10-CM

## 2018-01-23 ENCOUNTER — Other Ambulatory Visit: Payer: Self-pay | Admitting: Family Medicine

## 2018-01-23 DIAGNOSIS — I10 Essential (primary) hypertension: Secondary | ICD-10-CM | POA: Diagnosis not present

## 2018-01-23 DIAGNOSIS — E7800 Pure hypercholesterolemia, unspecified: Secondary | ICD-10-CM | POA: Diagnosis not present

## 2018-01-23 DIAGNOSIS — E559 Vitamin D deficiency, unspecified: Secondary | ICD-10-CM | POA: Diagnosis not present

## 2018-01-24 LAB — HEPATIC FUNCTION PANEL
AG Ratio: 2.1 (calc) (ref 1.0–2.5)
ALKALINE PHOSPHATASE (APISO): 78 U/L (ref 33–130)
ALT: 23 U/L (ref 6–29)
AST: 19 U/L (ref 10–35)
Albumin: 4.2 g/dL (ref 3.6–5.1)
BILIRUBIN INDIRECT: 0.4 mg/dL (ref 0.2–1.2)
BILIRUBIN TOTAL: 0.5 mg/dL (ref 0.2–1.2)
Bilirubin, Direct: 0.1 mg/dL (ref 0.0–0.2)
GLOBULIN: 2 g/dL (ref 1.9–3.7)
TOTAL PROTEIN: 6.2 g/dL (ref 6.1–8.1)

## 2018-01-24 LAB — LIPID PANEL
CHOLESTEROL: 130 mg/dL (ref <200)
HDL: 44 mg/dL — ABNORMAL LOW (ref >50)
LDL CHOLESTEROL (CALC): 69 mg/dL
NON-HDL CHOLESTEROL (CALC): 86 mg/dL (ref <130)
Total CHOL/HDL Ratio: 3 (calc) (ref <5.0)
Triglycerides: 83 mg/dL (ref <150)

## 2018-01-24 LAB — CBC WITH DIFFERENTIAL/PLATELET
BASOS ABS: 31 {cells}/uL (ref 0–200)
Basophils Relative: 0.8 %
EOS ABS: 78 {cells}/uL (ref 15–500)
Eosinophils Relative: 2 %
HCT: 40.2 % (ref 35.0–45.0)
HEMOGLOBIN: 13.4 g/dL (ref 11.7–15.5)
Lymphs Abs: 1065 cells/uL (ref 850–3900)
MCH: 29.7 pg (ref 27.0–33.0)
MCHC: 33.3 g/dL (ref 32.0–36.0)
MCV: 89.1 fL (ref 80.0–100.0)
MONOS PCT: 6.9 %
MPV: 12.3 fL (ref 7.5–12.5)
Neutro Abs: 2457 cells/uL (ref 1500–7800)
Neutrophils Relative %: 63 %
PLATELETS: 228 10*3/uL (ref 140–400)
RBC: 4.51 10*6/uL (ref 3.80–5.10)
RDW: 12 % (ref 11.0–15.0)
TOTAL LYMPHOCYTE: 27.3 %
WBC: 3.9 10*3/uL (ref 3.8–10.8)
WBCMIX: 269 {cells}/uL (ref 200–950)

## 2018-01-24 LAB — BASIC METABOLIC PANEL WITH GFR
BUN: 8 mg/dL (ref 7–25)
CO2: 29 mmol/L (ref 20–32)
Calcium: 9.3 mg/dL (ref 8.6–10.4)
Chloride: 107 mmol/L (ref 98–110)
Creat: 0.63 mg/dL (ref 0.60–0.93)
GFR, EST AFRICAN AMERICAN: 104 mL/min/{1.73_m2} (ref >=60)
GFR, EST NON AFRICAN AMERICAN: 90 mL/min/{1.73_m2} (ref >=60)
GLUCOSE: 109 mg/dL — AB (ref 65–99)
Potassium: 4.5 mmol/L (ref 3.5–5.3)
SODIUM: 142 mmol/L (ref 135–146)

## 2018-01-24 LAB — VITAMIN D 25 HYDROXY (VIT D DEFICIENCY, FRACTURES): Vit D, 25-Hydroxy: 47 ng/mL (ref 30–100)

## 2018-01-27 ENCOUNTER — Ambulatory Visit: Payer: Medicare HMO | Admitting: Family Medicine

## 2018-02-10 ENCOUNTER — Encounter (INDEPENDENT_AMBULATORY_CARE_PROVIDER_SITE_OTHER): Payer: Medicare HMO | Admitting: Ophthalmology

## 2018-02-10 DIAGNOSIS — H34831 Tributary (branch) retinal vein occlusion, right eye, with macular edema: Secondary | ICD-10-CM

## 2018-02-10 DIAGNOSIS — I10 Essential (primary) hypertension: Secondary | ICD-10-CM

## 2018-02-10 DIAGNOSIS — H35033 Hypertensive retinopathy, bilateral: Secondary | ICD-10-CM | POA: Diagnosis not present

## 2018-02-11 ENCOUNTER — Encounter: Payer: Self-pay | Admitting: Family Medicine

## 2018-02-11 ENCOUNTER — Ambulatory Visit (INDEPENDENT_AMBULATORY_CARE_PROVIDER_SITE_OTHER): Payer: Medicare HMO | Admitting: Family Medicine

## 2018-02-11 VITALS — BP 180/110 | HR 71 | Temp 97.2°F | Ht 66.0 in | Wt 105.0 lb

## 2018-02-11 DIAGNOSIS — E7800 Pure hypercholesterolemia, unspecified: Secondary | ICD-10-CM | POA: Diagnosis not present

## 2018-02-11 DIAGNOSIS — E559 Vitamin D deficiency, unspecified: Secondary | ICD-10-CM | POA: Diagnosis not present

## 2018-02-11 DIAGNOSIS — I10 Essential (primary) hypertension: Secondary | ICD-10-CM

## 2018-02-11 NOTE — Progress Notes (Signed)
Subjective:    [name], female    DOB: 05-23-1946, 72 y.o.   MRN: [id number]  HPI Pt here for follow up and management of chronic medical problems which includes hypertension and hyperlipidemia. She is taking medication regularly.  This patient refuses all health maintenance recommendations.  She does complain of elevated blood pressure.  Her blood pressure this morning was 165/78.  She has had lab work done and we will review that with her during the visit today.  Blood pressure wise she is currently taking amlodipine 2-1/2 mg daily and lisinopril daily.  The lisinopril is 20 mg.  Recent lab work indicates a normal white blood cell count a good hemoglobin at 13.4 and an adequate platelet count.  Cholesterol numbers were all excellent with an LDL C being 69 triglycerides 83 and HDL cholesterol that is slightly decreased.  The blood sugar was elevated at 109 and the creatinine and electrolytes were within normal limits.  The vitamin D level was good at 47.  All liver function tests were normal.  These will be reviewed with her during the visit today.  The patient today denies any chest pain pressure tightness or shortness of breath anymore than usual.  She denies any problems with her stomach including nausea vomiting diarrhea blood in the stool or black tarry bowel movements.  She is passing her water without problems.  She refuses all health maintenance issues and that will be emphasized again.  She does remain quite anxious about her condition for which she sees the ophthalmologist regularly.  The patient brings in blood pressures for review and they are up and down and she has seen the nephrologist in the past with no further recommendations regarding her labile hypertension.     Patient Active Problem List   Diagnosis Date Noted  . Hypertension 01/11/2014  . Vertigo 01/11/2014  . Hyperlipidemia 01/11/2014   Outpatient Encounter Medications as of 02/11/2018  Medication Sig    . amLODipine (NORVASC) 2.5 MG tablet Take 1 tablet (2.5 mg total) by mouth 2 (two) times daily.  Marland Kitchen atorvastatin (LIPITOR) 20 MG tablet Take 1 tablet (20 mg total) by mouth daily.  Marland Kitchen CINNAMON PO Take 1 capsule by mouth daily.  . COMBIGAN 0.2-0.5 % ophthalmic solution   . dorzolamide (TRUSOPT) 2 % ophthalmic solution   . fluorometholone (FML) 0.1 % ophthalmic suspension Place 1 drop into the right eye 4 (four) times daily.  . fluticasone (FLONASE) 50 MCG/ACT nasal spray Place 2 sprays into both nostrils daily.  Marland Kitchen latanoprost (XALATAN) 0.005 % ophthalmic solution   . lisinopril (PRINIVIL,ZESTRIL) 10 MG tablet TAKE 1 TABLET BY MOUTH ONCE DAILY IN THE MORNING, AND TAKE 2 TABLETS ONCE DAILY IN THE EVENING  . lisinopril (PRINIVIL,ZESTRIL) 20 MG tablet Take 1 tablet (20 mg total) by mouth 2 (two) times daily.  . metroNIDAZOLE (METROCREAM) 0.75 % cream Apply topically 2 (two) times daily.  . Omega-3 Fatty Acids (FISH OIL PO) Take 1 capsule by mouth daily.  Marland Kitchen PROLENSA 0.07 % SOLN    No facility-administered encounter medications on file as of 02/11/2018.      Review of Systems  Constitutional: Negative.   HENT: Negative.   Eyes: Negative.   Respiratory: Negative.   Cardiovascular: Negative.   Gastrointestinal: Negative.   Endocrine: Negative.   Genitourinary: Negative.   Musculoskeletal: Negative.   Skin: Negative.   Allergic/Immunologic: Negative.   Neurological: Negative.   Hematological: Negative.   Psychiatric/Behavioral: Negative.  Objective:   Physical Exam  Constitutional: She is oriented to person, place, and time. She appears well-developed and well-nourished. No distress.  The patient is small framed and has an underweight BMI.  HENT:  Head: Normocephalic and atraumatic.  Right Ear: External ear normal.  Left Ear: External ear normal.  Nose: Nose normal.  Mouth/Throat: Oropharynx is clear and moist. No oropharyngeal exudate.  Eyes: Pupils are equal, round, and  reactive to light. Conjunctivae and EOM are normal. Right eye exhibits no discharge. Left eye exhibits no discharge.  Neck: Normal range of motion. Neck supple. No thyromegaly present.  No bruits thyromegaly or anterior cervical adenopathy  Cardiovascular: Normal rate, regular rhythm, normal heart sounds and intact distal pulses.  No murmur heard. Heart is regular at 72/min  Pulmonary/Chest: Effort normal and breath sounds normal. She has no wheezes. She has no rales.  Clear anteriorly and posteriorly  Abdominal: Soft. Bowel sounds are normal. She exhibits no mass. There is no tenderness. There is no guarding.  No abdominal tenderness masses organ enlargement or bruits  Musculoskeletal: Normal range of motion. She exhibits no edema or deformity.  Lymphadenopathy:    She has no cervical adenopathy.  Neurological: She is alert and oriented to person, place, and time. She has normal reflexes. No cranial nerve deficit.  Skin: Skin is warm and dry. No rash noted.  Psychiatric: She has a normal mood and affect. Her behavior is normal. Judgment and thought content normal.  The patient is alert and pleasant  Nursing note and vitals reviewed.   BP (!) 165/78 (BP Location: Left Arm)   Pulse 71   Temp (!) 97.2 F (36.2 C) (Oral)   Ht 5\' 6"  (1.676 m)   Wt 105 lb (47.6 kg)   BMI 16.95 kg/m        Assessment & Plan:  1. Essential hypertension -P blood pressure was more elevated.  The patient brings readings from home that are much more reasonable.  She associates these elevated blood pressure readings with stress and anxiety and has seen the nephrologist in the past regarding her labile hypertension.  We will not make any changes today and she will continue to take her current treatment regimen and will bring some readings by for review in about 4 weeks and have her blood pressure checked again and get her Prevnar vaccine at that time.  2. Vitamin D deficiency -Continue current treatment as  recent vitamin D level was good.    3. Pure hypercholesterolemia -Continue current treatment  4. Labile essential hypertension -No change in treatment and continue to monitor blood pressure readings at home  5.  Decreased BMI at 16.9 -Patient should be encouraged to eat more healthy and get more calories in her diet.  Patient Instructions                       Medicare Annual Wellness Visit  [facility] and the medical providers at [facility] strive to bring you the best medical care.  In doing so we not only want to address your current medical conditions and concerns but also to detect new conditions early and prevent illness, disease and health-related problems.    Medicare offers a yearly Wellness Visit which allows our clinical staff to assess your need for preventative services including immunizations, lifestyle education, counseling to decrease risk of preventable diseases and screening for fall risk and other medical concerns.    This visit is provided free  of charge (no copay) for all Medicare recipients. The clinical pharmacists at [facility] have begun to conduct these Wellness Visits which will also include a thorough review of all your medications.    As you primary medical provider recommend that you make an appointment for your Annual Wellness Visit if you have not done so already this year.  You may set up this appointment before you leave today or you may call back ([phone]) and schedule an appointment.  Please make sure when you call that you mention that you are scheduling your Annual Wellness Visit with the clinical pharmacist so that the appointment may be made for the proper length of time.     Continue current medications. Continue good therapeutic lifestyle changes which include good diet and exercise. Fall precautions discussed with patient. If an FOBT was given today- please return it to our front desk. If you are  over 36 years old - you may need Prevnar 56 or the adult Pneumonia vaccine.  **Flu shots are available--- please call and schedule a FLU-CLINIC appointment**  After your visit with [place] today you will receive a survey in the mail or online from [employer] regarding your care with [place]. Please take a moment to fill this out. Your feedback is very important to [place] as you can help [place] better understand your patient needs as well as improve your experience and satisfaction. WE CARE ABOUT YOU!!!   Continue to take current medications.  Continue to monitor blood pressure readings as frequently as possible both in the morning and the evening. Consider trying an non-addicting antianxiety agent Like BuSpar continue to follow-up with Ophthalmology as planned Watch caffeine intake as much as possible and reduce sodium in the diet Bring blood pressures by for review in about 4 weeks and consider getting the Prevnar vaccine at that time  Arrie Senate MD

## 2018-02-11 NOTE — Patient Instructions (Addendum)
Medicare Annual Wellness Visit  [facility] and the medical providers at [facility] strive to bring you the best medical care.  In doing so we not only want to address your current medical conditions and concerns but also to detect new conditions early and prevent illness, disease and health-related problems.    Medicare offers a yearly Wellness Visit which allows our clinical staff to assess your need for preventative services including immunizations, lifestyle education, counseling to decrease risk of preventable diseases and screening for fall risk and other medical concerns.    This visit is provided free of charge (no copay) for all Medicare recipients. The clinical pharmacists at [facility] have begun to conduct these Wellness Visits which will also include a thorough review of all your medications.    As you primary medical provider recommend that you make an appointment for your Annual Wellness Visit if you have not done so already this year.  You may set up this appointment before you leave today or you may call back ([phone]) and schedule an appointment.  Please make sure when you call that you mention that you are scheduling your Annual Wellness Visit with the clinical pharmacist so that the appointment may be made for the proper length of time.     Continue current medications. Continue good therapeutic lifestyle changes which include good diet and exercise. Fall precautions discussed with patient. If an FOBT was given today- please return it to our front desk. If you are over 80 years old - you may need Prevnar 14 or the adult Pneumonia vaccine.  **Flu shots are available--- please call and schedule a FLU-CLINIC appointment**  After your visit with [place] today you will receive a survey in the mail or online from [employer] regarding your care with [place]. Please take a moment to fill this out. Your feedback is very  important to [place] as you can help [place] better understand your patient needs as well as improve your experience and satisfaction. WE CARE ABOUT YOU!!!   Continue to take current medications.  Continue to monitor blood pressure readings as frequently as possible both in the morning and the evening. Consider trying an non-addicting antianxiety agent Like BuSpar continue to follow-up with Ophthalmology as planned Watch caffeine intake as much as possible and reduce sodium in the diet Bring blood pressures by for review in about 4 weeks and consider getting the Prevnar vaccine at that time

## 2018-02-17 DIAGNOSIS — H40053 Ocular hypertension, bilateral: Secondary | ICD-10-CM | POA: Diagnosis not present

## 2018-02-17 DIAGNOSIS — B0239 Other herpes zoster eye disease: Secondary | ICD-10-CM | POA: Diagnosis not present

## 2018-02-17 DIAGNOSIS — H35353 Cystoid macular degeneration, bilateral: Secondary | ICD-10-CM | POA: Diagnosis not present

## 2018-02-17 DIAGNOSIS — Z961 Presence of intraocular lens: Secondary | ICD-10-CM | POA: Diagnosis not present

## 2018-02-17 DIAGNOSIS — H4063X1 Glaucoma secondary to drugs, bilateral, mild stage: Secondary | ICD-10-CM | POA: Diagnosis not present

## 2018-03-05 ENCOUNTER — Other Ambulatory Visit: Payer: Self-pay | Admitting: Family Medicine

## 2018-03-17 DIAGNOSIS — H35353 Cystoid macular degeneration, bilateral: Secondary | ICD-10-CM | POA: Diagnosis not present

## 2018-03-17 DIAGNOSIS — H4063X1 Glaucoma secondary to drugs, bilateral, mild stage: Secondary | ICD-10-CM | POA: Diagnosis not present

## 2018-03-17 DIAGNOSIS — B0239 Other herpes zoster eye disease: Secondary | ICD-10-CM | POA: Diagnosis not present

## 2018-03-17 DIAGNOSIS — H40053 Ocular hypertension, bilateral: Secondary | ICD-10-CM | POA: Diagnosis not present

## 2018-03-17 DIAGNOSIS — Z961 Presence of intraocular lens: Secondary | ICD-10-CM | POA: Diagnosis not present

## 2018-03-30 ENCOUNTER — Encounter (INDEPENDENT_AMBULATORY_CARE_PROVIDER_SITE_OTHER): Payer: Medicare HMO | Admitting: Ophthalmology

## 2018-03-30 DIAGNOSIS — H35033 Hypertensive retinopathy, bilateral: Secondary | ICD-10-CM | POA: Diagnosis not present

## 2018-03-30 DIAGNOSIS — H34831 Tributary (branch) retinal vein occlusion, right eye, with macular edema: Secondary | ICD-10-CM | POA: Diagnosis not present

## 2018-03-30 DIAGNOSIS — I10 Essential (primary) hypertension: Secondary | ICD-10-CM | POA: Diagnosis not present

## 2018-03-30 DIAGNOSIS — H35343 Macular cyst, hole, or pseudohole, bilateral: Secondary | ICD-10-CM | POA: Diagnosis not present

## 2018-04-23 ENCOUNTER — Other Ambulatory Visit: Payer: Self-pay | Admitting: Family Medicine

## 2018-04-28 DIAGNOSIS — H4063X1 Glaucoma secondary to drugs, bilateral, mild stage: Secondary | ICD-10-CM | POA: Diagnosis not present

## 2018-04-28 DIAGNOSIS — B0239 Other herpes zoster eye disease: Secondary | ICD-10-CM | POA: Diagnosis not present

## 2018-04-28 DIAGNOSIS — H40053 Ocular hypertension, bilateral: Secondary | ICD-10-CM | POA: Diagnosis not present

## 2018-04-28 DIAGNOSIS — H35353 Cystoid macular degeneration, bilateral: Secondary | ICD-10-CM | POA: Diagnosis not present

## 2018-04-28 DIAGNOSIS — Z961 Presence of intraocular lens: Secondary | ICD-10-CM | POA: Diagnosis not present

## 2018-05-07 DIAGNOSIS — L720 Epidermal cyst: Secondary | ICD-10-CM | POA: Diagnosis not present

## 2018-05-07 DIAGNOSIS — D1801 Hemangioma of skin and subcutaneous tissue: Secondary | ICD-10-CM | POA: Diagnosis not present

## 2018-05-07 DIAGNOSIS — L819 Disorder of pigmentation, unspecified: Secondary | ICD-10-CM | POA: Diagnosis not present

## 2018-05-07 DIAGNOSIS — L821 Other seborrheic keratosis: Secondary | ICD-10-CM | POA: Diagnosis not present

## 2018-05-07 DIAGNOSIS — L718 Other rosacea: Secondary | ICD-10-CM | POA: Diagnosis not present

## 2018-05-07 DIAGNOSIS — L814 Other melanin hyperpigmentation: Secondary | ICD-10-CM | POA: Diagnosis not present

## 2018-05-07 DIAGNOSIS — D229 Melanocytic nevi, unspecified: Secondary | ICD-10-CM | POA: Diagnosis not present

## 2018-05-22 ENCOUNTER — Encounter (INDEPENDENT_AMBULATORY_CARE_PROVIDER_SITE_OTHER): Payer: Medicare HMO | Admitting: Ophthalmology

## 2018-05-22 DIAGNOSIS — I10 Essential (primary) hypertension: Secondary | ICD-10-CM

## 2018-05-22 DIAGNOSIS — H35033 Hypertensive retinopathy, bilateral: Secondary | ICD-10-CM | POA: Diagnosis not present

## 2018-05-22 DIAGNOSIS — H34831 Tributary (branch) retinal vein occlusion, right eye, with macular edema: Secondary | ICD-10-CM | POA: Diagnosis not present

## 2018-05-25 ENCOUNTER — Encounter (INDEPENDENT_AMBULATORY_CARE_PROVIDER_SITE_OTHER): Payer: Medicare HMO | Admitting: Ophthalmology

## 2018-06-18 ENCOUNTER — Other Ambulatory Visit: Payer: Self-pay | Admitting: Family Medicine

## 2018-07-10 ENCOUNTER — Encounter (INDEPENDENT_AMBULATORY_CARE_PROVIDER_SITE_OTHER): Payer: Medicare HMO | Admitting: Ophthalmology

## 2018-07-10 DIAGNOSIS — H34831 Tributary (branch) retinal vein occlusion, right eye, with macular edema: Secondary | ICD-10-CM

## 2018-07-10 DIAGNOSIS — H35033 Hypertensive retinopathy, bilateral: Secondary | ICD-10-CM | POA: Diagnosis not present

## 2018-07-10 DIAGNOSIS — I10 Essential (primary) hypertension: Secondary | ICD-10-CM

## 2018-07-10 DIAGNOSIS — H35343 Macular cyst, hole, or pseudohole, bilateral: Secondary | ICD-10-CM | POA: Diagnosis not present

## 2018-07-29 DIAGNOSIS — H4063X1 Glaucoma secondary to drugs, bilateral, mild stage: Secondary | ICD-10-CM | POA: Diagnosis not present

## 2018-07-29 DIAGNOSIS — Z961 Presence of intraocular lens: Secondary | ICD-10-CM | POA: Diagnosis not present

## 2018-07-29 DIAGNOSIS — B0239 Other herpes zoster eye disease: Secondary | ICD-10-CM | POA: Diagnosis not present

## 2018-07-29 DIAGNOSIS — H35353 Cystoid macular degeneration, bilateral: Secondary | ICD-10-CM | POA: Diagnosis not present

## 2018-07-29 DIAGNOSIS — H40053 Ocular hypertension, bilateral: Secondary | ICD-10-CM | POA: Diagnosis not present

## 2018-07-31 ENCOUNTER — Telehealth: Payer: Self-pay | Admitting: Family Medicine

## 2018-07-31 DIAGNOSIS — E559 Vitamin D deficiency, unspecified: Secondary | ICD-10-CM

## 2018-07-31 DIAGNOSIS — E785 Hyperlipidemia, unspecified: Secondary | ICD-10-CM

## 2018-07-31 DIAGNOSIS — E7800 Pure hypercholesterolemia, unspecified: Secondary | ICD-10-CM

## 2018-07-31 DIAGNOSIS — I10 Essential (primary) hypertension: Secondary | ICD-10-CM

## 2018-07-31 DIAGNOSIS — M4000 Postural kyphosis, site unspecified: Secondary | ICD-10-CM

## 2018-07-31 NOTE — Telephone Encounter (Signed)
Lab orders printed.  Patient aware

## 2018-08-04 DIAGNOSIS — I10 Essential (primary) hypertension: Secondary | ICD-10-CM | POA: Diagnosis not present

## 2018-08-04 DIAGNOSIS — E559 Vitamin D deficiency, unspecified: Secondary | ICD-10-CM | POA: Diagnosis not present

## 2018-08-04 DIAGNOSIS — M4000 Postural kyphosis, site unspecified: Secondary | ICD-10-CM | POA: Diagnosis not present

## 2018-08-04 DIAGNOSIS — E785 Hyperlipidemia, unspecified: Secondary | ICD-10-CM | POA: Diagnosis not present

## 2018-08-04 DIAGNOSIS — R69 Illness, unspecified: Secondary | ICD-10-CM | POA: Diagnosis not present

## 2018-08-04 DIAGNOSIS — E7800 Pure hypercholesterolemia, unspecified: Secondary | ICD-10-CM | POA: Diagnosis not present

## 2018-08-07 ENCOUNTER — Telehealth: Payer: Self-pay | Admitting: Family Medicine

## 2018-08-07 NOTE — Telephone Encounter (Signed)
Pt aware - no results here yet (done at [facility])

## 2018-08-12 ENCOUNTER — Telehealth: Payer: Self-pay | Admitting: Family Medicine

## 2018-08-12 NOTE — Telephone Encounter (Signed)
Pt aware of paper copy of labs from [facility] - will scan copy in record and give pt a copy at next appt.

## 2018-08-21 ENCOUNTER — Ambulatory Visit (INDEPENDENT_AMBULATORY_CARE_PROVIDER_SITE_OTHER): Payer: Medicare HMO | Admitting: Family Medicine

## 2018-08-21 ENCOUNTER — Ambulatory Visit (INDEPENDENT_AMBULATORY_CARE_PROVIDER_SITE_OTHER): Payer: Medicare HMO

## 2018-08-21 ENCOUNTER — Encounter: Payer: Self-pay | Admitting: Family Medicine

## 2018-08-21 VITALS — BP 154/77 | HR 72 | Temp 97.0°F | Ht 66.0 in | Wt 103.0 lb

## 2018-08-21 DIAGNOSIS — E559 Vitamin D deficiency, unspecified: Secondary | ICD-10-CM

## 2018-08-21 DIAGNOSIS — I10 Essential (primary) hypertension: Secondary | ICD-10-CM

## 2018-08-21 DIAGNOSIS — E7800 Pure hypercholesterolemia, unspecified: Secondary | ICD-10-CM | POA: Diagnosis not present

## 2018-08-21 DIAGNOSIS — Z23 Encounter for immunization: Secondary | ICD-10-CM | POA: Diagnosis not present

## 2018-08-21 MED ORDER — AMLODIPINE BESYLATE 2.5 MG PO TABS: 2.5000 mg | ORAL_TABLET | Freq: Two times a day (BID) | ORAL | 3 refills | Status: DC

## 2018-08-21 MED ORDER — FLUTICASONE PROPIONATE 50 MCG/ACT NA SUSP: 2.0000 | Freq: Every day | NASAL | 6 refills | Status: DC

## 2018-08-21 MED ORDER — LISINOPRIL 20 MG PO TABS: 20.0000 mg | ORAL_TABLET | Freq: Two times a day (BID) | ORAL | 3 refills | Status: DC

## 2018-08-21 MED ORDER — ATORVASTATIN CALCIUM 20 MG PO TABS: 20.0000 mg | ORAL_TABLET | Freq: Every day | ORAL | 3 refills | Status: DC

## 2018-08-21 NOTE — Progress Notes (Signed)
Subjective:    [name], female    DOB: 1946-03-05, 72 y.o.   MRN: [id number]  HPI Pt here for follow up and management of chronic medical problems which includes hypertension and hyperlipidemia. She is taking medication regularly.  The patient has brought in lab work from [facility].  She has a lipid liver panel which has an LDL C of 66 triglycerides at 90 and an HDL that is low by their standards at 50.  The BMP has a normal blood sugar at 99 BUN at 8 creatinine at 0.56 which is slightly decreased and electrolytes including potassium that are all normal.  All liver function tests were normal.  The CBC had a normal white blood cell count a good hemoglobin at 13.3 and an adequate platelet count.  The vitamin D level was good at 44.  This was recently reviewed and the patient will be given a copy of this.  The patient today has no complaints and is requesting refills on several of her medicines.  She will get her Prevnar today.  She is also due to get a chest x-ray return in FOBT.  The patient is doing well.  She is getting ready to have her fifth grandchild when she is very excited about that and spends a lot of time helping her daughters with her grandchildren.  Today she denies any chest pain pressure tightness or shortness of breath.  She denies any trouble with swallowing heartburn indigestion nausea vomiting diarrhea blood in the stool black tarry bowel movements or change in bowel habits.  She refuses the Cologuard or doing any kind of colonoscopy.  She is passing her water without problems.  She sees the eye doctor regularly and frequently.  She is requesting refills on several of her medicines.  The patient brings in home blood pressures for review and all of these were excellent except for 1.  We will therefore not make any changes in her current medication regimen.     Patient Active Problem List   Diagnosis Date Noted  . Hypertension 01/11/2014  . Vertigo  01/11/2014  . Hyperlipidemia 01/11/2014   Outpatient Encounter Medications as of 08/21/2018  Medication Sig  . amLODipine (NORVASC) 2.5 MG tablet TAKE 1 TABLET BY MOUTH TWICE DAILY  . atorvastatin (LIPITOR) 20 MG tablet TAKE 1 TABLET BY MOUTH ONCE DAILY  . CINNAMON PO Take 1 capsule by mouth daily.  . COMBIGAN 0.2-0.5 % ophthalmic solution   . dorzolamide (TRUSOPT) 2 % ophthalmic solution   . fluorometholone (FML) 0.1 % ophthalmic suspension Place 1 drop into the right eye 4 (four) times daily.  . fluticasone (FLONASE) 50 MCG/ACT nasal spray Place 2 sprays into both nostrils daily.  Marland Kitchen latanoprost (XALATAN) 0.005 % ophthalmic solution   . lisinopril (PRINIVIL,ZESTRIL) 20 MG tablet TAKE 1 TABLET BY MOUTH TWICE DAILY  . metroNIDAZOLE (METROCREAM) 0.75 % cream Apply topically 2 (two) times daily.  . Omega-3 Fatty Acids (FISH OIL PO) Take 1 capsule by mouth daily.  Marland Kitchen PROLENSA 0.07 % SOLN    No facility-administered encounter medications on file as of 08/21/2018.      Review of Systems  Constitutional: Negative.   HENT: Negative.   Eyes: Negative.   Respiratory: Negative.   Cardiovascular: Negative.   Gastrointestinal: Negative.   Endocrine: Negative.   Genitourinary: Negative.   Musculoskeletal: Negative.   Skin: Negative.   Allergic/Immunologic: Negative.   Neurological: Negative.   Hematological: Negative.   Psychiatric/Behavioral: Negative.  Objective:   Physical Exam  Constitutional: She is oriented to person, place, and time. She appears well-developed and well-nourished. No distress.  The patient is pleasant and doing well with no specific complaints other than in the morning when she takes her blood pressure medicine she feels somewhat lightheaded after taking it.  HENT:  Head: Normocephalic and atraumatic.  Right Ear: External ear normal.  Left Ear: External ear normal.  Mouth/Throat: Oropharynx is clear and moist. No oropharyngeal exudate.  Slight nasal  congestion left side  Eyes: Pupils are equal, round, and reactive to light. Conjunctivae and EOM are normal. Right eye exhibits no discharge. Left eye exhibits no discharge. No scleral icterus.  Frequent eye exams  Neck: Normal range of motion. Neck supple. No thyromegaly present.  No bruits thyromegaly or anterior cervical adenopathy  Cardiovascular: Normal rate, regular rhythm, normal heart sounds and intact distal pulses.  No murmur heard. Heart is regular at 72/min without murmur or gallop  Pulmonary/Chest: Effort normal and breath sounds normal. She has no wheezes. She has no rales.  Clear anteriorly and posteriorly  Abdominal: Soft. Bowel sounds are normal. She exhibits no mass. There is no tenderness.  No liver or spleen enlargement or epigastric tenderness.  No masses no bruits and no inguinal adenopathy.  Musculoskeletal: Normal range of motion. She exhibits no edema.  Lymphadenopathy:    She has no cervical adenopathy.  Neurological: She is alert and oriented to person, place, and time. She has normal reflexes. No cranial nerve deficit.  Patient is alert with 3+ and equal reflexes bilaterally.  Skin: Skin is warm and dry. No rash noted.  Sees dermatologist yearly for total body skin check.  Psychiatric: She has a normal mood and affect. Her behavior is normal. Judgment and thought content normal.  The patient's mood affect and behavior are all normal for her.  Nursing note and vitals reviewed.   BP (!) 176/86 (BP Location: Left Arm)   Pulse 72   Temp (!) 97 F (36.1 C) (Oral)   Ht 5\' 6"  (1.676 m)   Wt 103 lb (46.7 kg)   BMI 16.62 kg/m         Assessment & Plan:  1. Essential hypertension -In office blood pressures are elevated.  All home readings are good and no change in treatment based on the home readings. - DG Chest 2 View; Future  2. Vitamin D deficiency -The vitamin D level was good and she will continue with current treatment  3. Pure  hypercholesterolemia -All cholesterol numbers were good and she will continue with therapeutic lifestyle changes and try to get as much exercise as possible to raise the good cholesterol.  She will continue with omega-3 fatty acids and atorvastatin. - DG Chest 2 View; Future  Meds ordered this encounter  Medications  . lisinopril (PRINIVIL,ZESTRIL) 20 MG tablet    Sig: Take 1 tablet (20 mg total) by mouth 2 (two) times daily.    Dispense:  180 tablet    Refill:  3  . fluticasone (FLONASE) 50 MCG/ACT nasal spray    Sig: Place 2 sprays into both nostrils daily.    Dispense:  16 g    Refill:  6  . atorvastatin (LIPITOR) 20 MG tablet    Sig: Take 1 tablet (20 mg total) by mouth daily.    Dispense:  90 tablet    Refill:  3  . amLODipine (NORVASC) 2.5 MG tablet    Sig: Take 1 tablet (2.5 mg total)  by mouth 2 (two) times daily.    Dispense:  180 tablet    Refill:  3   Patient Instructions                       Medicare Annual Wellness Visit  [facility] and the medical providers at [facility] strive to bring you the best medical care.  In doing so we not only want to address your current medical conditions and concerns but also to detect new conditions early and prevent illness, disease and health-related problems.    Medicare offers a yearly Wellness Visit which allows our clinical staff to assess your need for preventative services including immunizations, lifestyle education, counseling to decrease risk of preventable diseases and screening for fall risk and other medical concerns.    This visit is provided free of charge (no copay) for all Medicare recipients. The clinical pharmacists at [facility] have begun to conduct these Wellness Visits which will also include a thorough review of all your medications.    As you primary medical provider recommend that you make an appointment for your Annual Wellness Visit if you have not done so  already this year.  You may set up this appointment before you leave today or you may call back ([phone]) and schedule an appointment.  Please make sure when you call that you mention that you are scheduling your Annual Wellness Visit with the clinical pharmacist so that the appointment may be made for the proper length of time.     Continue current medications. Continue good therapeutic lifestyle changes which include good diet and exercise. Fall precautions discussed with patient. If an FOBT was given today- please return it to our front desk. If you are over 34 years old - you may need Prevnar 74 or the adult Pneumonia vaccine.  **Flu shots are available--- please call and schedule a FLU-CLINIC appointment**  After your visit with [place] today you will receive a survey in the mail or online from [employer] regarding your care with [place]. Please take a moment to fill this out. Your feedback is very important to [place] as you can help [place] better understand your patient needs as well as improve your experience and satisfaction. WE CARE ABOUT YOU!!!  Continue current treatment. Check blood pressures regularly and bring readings to the office at each visit Stay active physically and always be careful to not put her self at risk for falling and avoid climbing Consider doing the Cologuard in the future as this is away at least on a one-time episode to check for colon cancer that is more than just checking for blood in the stool.   Arrie Senate MD

## 2018-08-21 NOTE — Patient Instructions (Addendum)
Medicare Annual Wellness Visit  [facility] and the medical providers at [facility] strive to bring you the best medical care.  In doing so we not only want to address your current medical conditions and concerns but also to detect new conditions early and prevent illness, disease and health-related problems.    Medicare offers a yearly Wellness Visit which allows our clinical staff to assess your need for preventative services including immunizations, lifestyle education, counseling to decrease risk of preventable diseases and screening for fall risk and other medical concerns.    This visit is provided free of charge (no copay) for all Medicare recipients. The clinical pharmacists at [facility] have begun to conduct these Wellness Visits which will also include a thorough review of all your medications.    As you primary medical provider recommend that you make an appointment for your Annual Wellness Visit if you have not done so already this year.  You may set up this appointment before you leave today or you may call back ([phone]) and schedule an appointment.  Please make sure when you call that you mention that you are scheduling your Annual Wellness Visit with the clinical pharmacist so that the appointment may be made for the proper length of time.     Continue current medications. Continue good therapeutic lifestyle changes which include good diet and exercise. Fall precautions discussed with patient. If an FOBT was given today- please return it to our front desk. If you are over 60 years old - you may need Prevnar 52 or the adult Pneumonia vaccine.  **Flu shots are available--- please call and schedule a FLU-CLINIC appointment**  After your visit with [place] today you will receive a survey in the mail or online from [employer] regarding your care with [place]. Please take a moment to fill this out. Your feedback is very  important to [place] as you can help [place] better understand your patient needs as well as improve your experience and satisfaction. WE CARE ABOUT YOU!!!  Continue current treatment. Check blood pressures regularly and bring readings to the office at each visit Stay active physically and always be careful to not put her self at risk for falling and avoid climbing Consider doing the Cologuard in the future as this is away at least on a one-time episode to check for colon cancer that is more than just checking for blood in the stool.

## 2018-09-18 ENCOUNTER — Encounter (INDEPENDENT_AMBULATORY_CARE_PROVIDER_SITE_OTHER): Payer: Medicare HMO | Admitting: Ophthalmology

## 2018-09-18 DIAGNOSIS — H35033 Hypertensive retinopathy, bilateral: Secondary | ICD-10-CM | POA: Diagnosis not present

## 2018-09-18 DIAGNOSIS — I10 Essential (primary) hypertension: Secondary | ICD-10-CM | POA: Diagnosis not present

## 2018-09-18 DIAGNOSIS — H34831 Tributary (branch) retinal vein occlusion, right eye, with macular edema: Secondary | ICD-10-CM

## 2018-09-18 DIAGNOSIS — H353112 Nonexudative age-related macular degeneration, right eye, intermediate dry stage: Secondary | ICD-10-CM

## 2018-09-18 DIAGNOSIS — H35343 Macular cyst, hole, or pseudohole, bilateral: Secondary | ICD-10-CM

## 2018-11-30 DIAGNOSIS — Z961 Presence of intraocular lens: Secondary | ICD-10-CM | POA: Diagnosis not present

## 2018-11-30 DIAGNOSIS — H40053 Ocular hypertension, bilateral: Secondary | ICD-10-CM | POA: Diagnosis not present

## 2018-11-30 DIAGNOSIS — B0239 Other herpes zoster eye disease: Secondary | ICD-10-CM | POA: Diagnosis not present

## 2018-11-30 DIAGNOSIS — H2011 Chronic iridocyclitis, right eye: Secondary | ICD-10-CM | POA: Diagnosis not present

## 2018-11-30 DIAGNOSIS — Z1231 Encounter for screening mammogram for malignant neoplasm of breast: Secondary | ICD-10-CM | POA: Diagnosis not present

## 2018-11-30 DIAGNOSIS — H4063X1 Glaucoma secondary to drugs, bilateral, mild stage: Secondary | ICD-10-CM | POA: Diagnosis not present

## 2018-11-30 DIAGNOSIS — H35353 Cystoid macular degeneration, bilateral: Secondary | ICD-10-CM | POA: Diagnosis not present

## 2018-11-30 LAB — HM MAMMOGRAPHY

## 2018-12-18 ENCOUNTER — Encounter (INDEPENDENT_AMBULATORY_CARE_PROVIDER_SITE_OTHER): Payer: Medicare HMO | Admitting: Ophthalmology

## 2018-12-18 ENCOUNTER — Other Ambulatory Visit: Payer: Self-pay

## 2019-02-05 ENCOUNTER — Other Ambulatory Visit: Payer: Self-pay | Admitting: Family Medicine

## 2019-02-08 ENCOUNTER — Telehealth: Payer: Self-pay | Admitting: Family Medicine

## 2019-02-08 MED ORDER — LISINOPRIL 10 MG PO TABS: ORAL_TABLET | ORAL | 3 refills | Status: DC

## 2019-02-08 NOTE — Telephone Encounter (Signed)
Pharmacy [facility]   Pt states that she is needing a refill on her lisniopril she states that she takes 10 mg in morning and takes 20 mg in evenings, she would like to have it to where she doesn't have to cut the pill in half.  Pt states that she canceled her apt with Dr Laurance Flatten for this month due to coronavirus and will reschedule later

## 2019-02-08 NOTE — Telephone Encounter (Signed)
Ordered med for 10 mg -1 am and 2 in pm.

## 2019-02-19 ENCOUNTER — Ambulatory Visit: Payer: Medicare HMO | Admitting: Family Medicine

## 2019-03-10 ENCOUNTER — Telehealth: Payer: Self-pay | Admitting: Family Medicine

## 2019-04-02 ENCOUNTER — Encounter (INDEPENDENT_AMBULATORY_CARE_PROVIDER_SITE_OTHER): Payer: Medicare HMO | Admitting: Ophthalmology

## 2019-04-02 ENCOUNTER — Other Ambulatory Visit: Payer: Self-pay

## 2019-04-02 DIAGNOSIS — H35343 Macular cyst, hole, or pseudohole, bilateral: Secondary | ICD-10-CM | POA: Diagnosis not present

## 2019-04-02 DIAGNOSIS — H35033 Hypertensive retinopathy, bilateral: Secondary | ICD-10-CM

## 2019-04-02 DIAGNOSIS — H34833 Tributary (branch) retinal vein occlusion, bilateral, with macular edema: Secondary | ICD-10-CM | POA: Diagnosis not present

## 2019-04-02 DIAGNOSIS — I10 Essential (primary) hypertension: Secondary | ICD-10-CM

## 2019-04-07 DIAGNOSIS — H35353 Cystoid macular degeneration, bilateral: Secondary | ICD-10-CM | POA: Diagnosis not present

## 2019-04-07 DIAGNOSIS — H4063X1 Glaucoma secondary to drugs, bilateral, mild stage: Secondary | ICD-10-CM | POA: Diagnosis not present

## 2019-04-07 DIAGNOSIS — Z961 Presence of intraocular lens: Secondary | ICD-10-CM | POA: Diagnosis not present

## 2019-04-07 DIAGNOSIS — H2011 Chronic iridocyclitis, right eye: Secondary | ICD-10-CM | POA: Diagnosis not present

## 2019-07-23 ENCOUNTER — Encounter (INDEPENDENT_AMBULATORY_CARE_PROVIDER_SITE_OTHER): Payer: Medicare HMO | Admitting: Ophthalmology

## 2019-07-23 DIAGNOSIS — H35033 Hypertensive retinopathy, bilateral: Secondary | ICD-10-CM

## 2019-07-23 DIAGNOSIS — I10 Essential (primary) hypertension: Secondary | ICD-10-CM | POA: Diagnosis not present

## 2019-07-23 DIAGNOSIS — H34833 Tributary (branch) retinal vein occlusion, bilateral, with macular edema: Secondary | ICD-10-CM

## 2019-08-02 DIAGNOSIS — R69 Illness, unspecified: Secondary | ICD-10-CM | POA: Diagnosis not present

## 2019-08-04 ENCOUNTER — Other Ambulatory Visit: Payer: Self-pay | Admitting: Family Medicine

## 2019-08-04 DIAGNOSIS — H4063X1 Glaucoma secondary to drugs, bilateral, mild stage: Secondary | ICD-10-CM | POA: Diagnosis not present

## 2019-08-04 DIAGNOSIS — H35353 Cystoid macular degeneration, bilateral: Secondary | ICD-10-CM | POA: Diagnosis not present

## 2019-08-04 DIAGNOSIS — H2011 Chronic iridocyclitis, right eye: Secondary | ICD-10-CM | POA: Diagnosis not present

## 2019-08-04 DIAGNOSIS — Z961 Presence of intraocular lens: Secondary | ICD-10-CM | POA: Diagnosis not present

## 2019-10-22 ENCOUNTER — Encounter: Payer: Self-pay | Admitting: Family Medicine

## 2019-10-22 ENCOUNTER — Ambulatory Visit (INDEPENDENT_AMBULATORY_CARE_PROVIDER_SITE_OTHER): Payer: Medicare HMO | Admitting: Family Medicine

## 2019-10-22 DIAGNOSIS — I10 Essential (primary) hypertension: Secondary | ICD-10-CM

## 2019-10-22 DIAGNOSIS — E782 Mixed hyperlipidemia: Secondary | ICD-10-CM

## 2019-10-22 MED ORDER — ATORVASTATIN CALCIUM 20 MG PO TABS: 20.0000 mg | ORAL_TABLET | Freq: Every day | ORAL | 0 refills | Status: DC

## 2019-10-22 MED ORDER — AMLODIPINE BESYLATE 2.5 MG PO TABS: 2.5000 mg | ORAL_TABLET | Freq: Two times a day (BID) | ORAL | 0 refills | Status: DC

## 2019-10-22 NOTE — Progress Notes (Signed)
Virtual Visit via telephone Note Due to COVID-19 pandemic this visit was conducted virtually. This visit type was conducted due to national recommendations for restrictions regarding the COVID-19 Pandemic (e.g. social distancing, sheltering in place) in an effort to limit this patient's exposure and mitigate transmission in our community. All issues noted in this document were discussed and addressed.  A physical exam was not performed with this format.   I connected with Mckenzie [name] on 10/22/2019 at 1000 by telephone and verified that I am speaking with the correct person using two identifiers. Mckenzie [name] is currently located at home and family is currently with them during visit. The provider, Monia Pouch, FNP is located in their office at time of visit.  I discussed the limitations, risks, security and privacy concerns of performing an evaluation and management service by telephone and the availability of in person appointments. I also discussed with the patient that there may be a patient responsible charge related to this service. The patient expressed understanding and agreed to proceed.  Subjective:  [name], female    DOB: [birth year], 74 y.o.   MRN: [id number]  Chief Complaint:  Medical Management of Chronic Issues   HPI: Mckenzie [name] is a 74 y.o. female presenting on 10/22/2019 for Medical Management of Chronic Issues   Patient reports that she needs a refill on her amlodipine and atorvastatin.  Patient states she does monitor her blood pressure at home and it is doing well.  She does try to watch her diet and keep active.  She denies any associated side effects from the medications.  No chest pain, palpitations, headaches, shortness of breath, dizziness, confusion, weakness, syncope, or leg swelling.  No myalgias or arthralgias.  Patient has not had labs since April 2019.  Discussed the need to have labs completed and the importance of this with  patient in detail today.  Patient willing to have labs completed within the next 2 weeks.  She will call the office to have this done.  Patient aware to make a follow-up appointment in office in 3 months.  Patient states she has not been to the office or to have labs completed due to the COVID-19 pandemic.  Patient aware of infection prevention guidelines that are in place to prevent spread of disease.    Relevant past medical, surgical, family, and social history reviewed and updated as indicated.  Allergies and medications reviewed and updated.   Past Medical History:  Diagnosis Date  . Depression   . High cholesterol   . Hypertension   . Retinal micro-aneurysm of right eye     Past Surgical History:  Procedure Laterality Date  . EYE SURGERY    . FINGER SURGERY Left    RING FINGER  . TUBAL LIGATION      Social History   Socioeconomic History  . Marital status: Married    Spouse name: Not on file  . Number of children: Not on file  . Years of education: Not on file  . Highest education level: Not on file  Occupational History  . Not on file  Tobacco Use  . Smoking status: Never Smoker  . Smokeless tobacco: Never Used  Substance and Sexual Activity  . Alcohol use: No  . Drug use: No  . Sexual activity: Not on file  Other Topics Concern  . Not on file  Social History Narrative  . Not on file   Social Determinants of Health  Financial Resource Strain:   . Difficulty of Paying Living Expenses: Not on file  Food Insecurity:   . Worried About Charity fundraiser in the Last Year: Not on file  . Ran Out of Food in the Last Year: Not on file  Transportation Needs:   . Lack of Transportation (Medical): Not on file  . Lack of Transportation (Non-Medical): Not on file  Physical Activity:   . Days of Exercise per Week: Not on file  . Minutes of Exercise per Session: Not on file  Stress:   . Feeling of Stress : Not on file  Social Connections:   . Frequency of  Communication with Friends and Family: Not on file  . Frequency of Social Gatherings with Friends and Family: Not on file  . Attends Religious Services: Not on file  . Active Member of Clubs or Organizations: Not on file  . Attends Archivist Meetings: Not on file  . Marital Status: Not on file  Intimate Partner Violence:   . Fear of Current or Ex-Partner: Not on file  . Emotionally Abused: Not on file  . Physically Abused: Not on file  . Sexually Abused: Not on file    Outpatient Encounter Medications as of 10/22/2019  Medication Sig  . amLODipine (NORVASC) 2.5 MG tablet Take 1 tablet (2.5 mg total) by mouth 2 (two) times daily. (Needs to see new provider)  . atorvastatin (LIPITOR) 20 MG tablet Take 1 tablet (20 mg total) by mouth daily.  Marland Kitchen CINNAMON PO Take 1 capsule by mouth daily.  . COMBIGAN 0.2-0.5 % ophthalmic solution   . dorzolamide (TRUSOPT) 2 % ophthalmic solution   . fluorometholone (FML) 0.1 % ophthalmic suspension Place 1 drop into the right eye 4 (four) times daily.  . fluticasone (FLONASE) 50 MCG/ACT nasal spray Place 2 sprays into both nostrils daily.  Marland Kitchen latanoprost (XALATAN) 0.005 % ophthalmic solution   . lisinopril (ZESTRIL) 10 MG tablet Take one tab in the AM and 2 tabs in the PM  . metroNIDAZOLE (METROCREAM) 0.75 % cream Apply topically 2 (two) times daily.  . Omega-3 Fatty Acids (FISH OIL PO) Take 1 capsule by mouth daily.  Marland Kitchen PROLENSA 0.07 % SOLN   . UNABLE TO FIND Med Name: CBD oil  . [DISCONTINUED] amLODipine (NORVASC) 2.5 MG tablet Take 1 tablet (2.5 mg total) by mouth 2 (two) times daily. (Needs to see new provider)  . [DISCONTINUED] atorvastatin (LIPITOR) 20 MG tablet Take 1 tablet (20 mg total) by mouth daily.   No facility-administered encounter medications on file as of 10/22/2019.    Allergies  Allergen Reactions  . Aldomet [Methyldopa] Other (See Comments)    flu  . Fosamax [Alendronate Sodium]   . Acetazolamide Rash  . Buspar  [Buspirone] Palpitations    Review of Systems  Constitutional: Negative for activity change, appetite change, chills, diaphoresis, fatigue, fever and unexpected weight change.  HENT: Negative.   Eyes: Negative.  Negative for photophobia and visual disturbance.  Respiratory: Negative for cough, chest tightness and shortness of breath.   Cardiovascular: Negative for chest pain, palpitations and leg swelling.  Gastrointestinal: Negative for abdominal pain, blood in stool, constipation, diarrhea, nausea and vomiting.  Endocrine: Negative.   Genitourinary: Negative for decreased urine volume, difficulty urinating, dysuria, frequency and urgency.  Musculoskeletal: Negative for arthralgias and myalgias.  Skin: Negative.  Negative for color change and rash.  Allergic/Immunologic: Negative.   Neurological: Negative for dizziness, tremors, seizures, syncope, facial asymmetry, speech difficulty, weakness,  light-headedness, numbness and headaches.  Hematological: Negative.   Psychiatric/Behavioral: Negative for confusion, hallucinations, sleep disturbance and suicidal ideas.  All other systems reviewed and are negative.        Observations/Objective: No vital signs or physical exam, this was a telephone or virtual health encounter.  Pt alert and oriented, answers all questions appropriately, and able to speak in full sentences.    Assessment and Plan: Aranza was seen today for medical management of chronic issues.  Diagnoses and all orders for this visit:  Essential hypertension Patient reports blood pressure is well controlled at home with current medications.  Will refill Norvasc today.  Patient aware she will need an in office appointment prior to next refill.  Patient agrees to have lab work done.  Orders placed.  Patient will have done within the next 2 weeks.  Diet and exercise encouraged.  Continue medications as prescribed. -     amLODipine (NORVASC) 2.5 MG tablet; Take 1 tablet (2.5  mg total) by mouth 2 (two) times daily. (Needs to see new provider) -     CMP14+EGFR; Future -     CBC with Differential/Platelet; Future -     Lipid panel; Future -     Thyroid Panel With TSH; Future  Mixed hyperlipidemia Diet encouraged - increase intake of fresh fruits and vegetables, increase intake of lean proteins. Bake, broil, or grill foods. Avoid fried, greasy, and fatty foods. Avoid fast foods. Increase intake of fiber-rich whole grains. Exercise encouraged - at least 150 minutes per week and advance as tolerated.  Goal BMI < 25. Continue medications as prescribed. Follow up in 3-6 months as discussed. Patient will have labs completed within the next 2 weeks. -     atorvastatin (LIPITOR) 20 MG tablet; Take 1 tablet (20 mg total) by mouth daily. -     Lipid panel; Future     Follow Up Instructions: Return in about 3 months (around 01/20/2020), or if symptoms worsen or fail to improve, for HTN, Lipids.    I discussed the assessment and treatment plan with the patient. The patient was provided an opportunity to ask questions and all were answered. The patient agreed with the plan and demonstrated an understanding of the instructions.   The patient was advised to call back or seek an in-person evaluation if the symptoms worsen or if the condition fails to improve as anticipated.  The above assessment and management plan was discussed with the patient. The patient verbalized understanding of and has agreed to the management plan. Patient is aware to call the clinic if they develop any new symptoms or if symptoms persist or worsen. Patient is aware when to return to the clinic for a follow-up visit. Patient educated on when it is appropriate to go to the emergency department.    I provided 25 minutes of non-face-to-face time during this encounter. The call started at 1000. The call ended at 1025. The other time was used for coordination of care.    Monia Pouch, FNP-C [facility] [address] [place], [place] [address] [phone] 10/22/2019

## 2019-10-22 NOTE — Patient Instructions (Signed)
Pt needs to be seen in office for additional refills and pt needs to have labs completed.    DASH Eating Plan DASH stands for "Dietary Approaches to Stop Hypertension." The DASH eating plan is a healthy eating plan that has been shown to reduce high blood pressure (hypertension). Additional health benefits may include reducing the risk of type 2 diabetes mellitus, heart disease, and stroke. The DASH eating plan may also help with weight loss.  WHAT DO I NEED TO KNOW ABOUT THE DASH EATING PLAN? For the DASH eating plan, you will follow these general guidelines:  Choose foods with a percent daily value for sodium of less than 5% (as listed on the food label).  Use salt-free seasonings or herbs instead of table salt or sea salt.  Check with your health care provider or pharmacist before using salt substitutes.  Eat lower-sodium products, often labeled as "lower sodium" or "no salt added."  Eat fresh foods.  Eat more vegetables, fruits, and low-fat dairy products.  Choose whole grains. Look for the word "whole" as the first word in the ingredient list.  Choose fish and skinless chicken or [place] more often than red meat. Limit fish, poultry, and meat to 6 oz (170 g) each day.  Limit sweets, desserts, sugars, and sugary drinks.  Choose heart-healthy fats.  Limit cheese to 1 oz (28 g) per day.  Eat more home-cooked food and less restaurant, buffet, and fast food.  Limit fried foods.  Cook foods using methods other than frying.  Limit canned vegetables. If you do use them, rinse them well to decrease the sodium.  When eating at a restaurant, ask that your food be prepared with less salt, or no salt if possible.  WHAT FOODS CAN I EAT? Seek help from a dietitian for individual calorie needs.  Grains Whole grain or whole wheat bread. Brown rice. Whole grain or whole wheat pasta. Quinoa, bulgur, and whole grain cereals. Low-sodium cereals. Corn or whole wheat flour tortillas. Whole  grain cornbread. Whole grain crackers. Low-sodium crackers.  Vegetables Fresh or frozen vegetables (raw, steamed, roasted, or grilled). Low-sodium or reduced-sodium tomato and vegetable juices. Low-sodium or reduced-sodium tomato sauce and paste. Low-sodium or reduced-sodium canned vegetables.   Fruits All fresh, canned (in natural juice), or frozen fruits.  Meat and Other Protein Products Ground beef (85% or leaner), grass-fed beef, or beef trimmed of fat. Skinless chicken or [place]. Ground chicken or [place]. Pork trimmed of fat. All fish and seafood. Eggs. Dried beans, peas, or lentils. Unsalted nuts and seeds. Unsalted canned beans.  Dairy Low-fat dairy products, such as skim or 1% milk, 2% or reduced-fat cheeses, low-fat ricotta or cottage cheese, or plain low-fat yogurt. Low-sodium or reduced-sodium cheeses.  Fats and Oils Tub margarines without trans fats. Light or reduced-fat mayonnaise and salad dressings (reduced sodium). Avocado. Safflower, olive, or canola oils. Natural peanut or almond butter.  Other Unsalted popcorn and pretzels. The items listed above may not be a complete list of recommended foods or beverages. Contact your dietitian for more options.  WHAT FOODS ARE NOT RECOMMENDED?  Grains White bread. White pasta. White rice. Refined cornbread. Bagels and croissants. Crackers that contain trans fat.  Vegetables Creamed or fried vegetables. Vegetables in a cheese sauce. Regular canned vegetables. Regular canned tomato sauce and paste. Regular tomato and vegetable juices.  Fruits Dried fruits. Canned fruit in light or heavy syrup. Fruit juice.  Meat and Other Protein Products Fatty cuts of meat. Ribs, chicken wings, bacon, sausage,  bologna, salami, chitterlings, fatback, hot dogs, bratwurst, and packaged luncheon meats. Salted nuts and seeds. Canned beans with salt.  Dairy Whole or 2% milk, cream, half-and-half, and cream cheese. Whole-fat or sweetened yogurt.  Full-fat cheeses or blue cheese. Nondairy creamers and whipped toppings. Processed cheese, cheese spreads, or cheese curds.  Condiments Onion and garlic salt, seasoned salt, table salt, and sea salt. Canned and packaged gravies. Worcestershire sauce. Tartar sauce. Barbecue sauce. Teriyaki sauce. Soy sauce, including reduced sodium. Steak sauce. Fish sauce. Oyster sauce. Cocktail sauce. Horseradish. Ketchup and mustard. Meat flavorings and tenderizers. Bouillon cubes. Hot sauce. Tabasco sauce. Marinades. Taco seasonings. Relishes.  Fats and Oils Butter, stick margarine, lard, shortening, ghee, and bacon fat. Coconut, palm kernel, or palm oils. Regular salad dressings.  Other Pickles and olives. Salted popcorn and pretzels.  The items listed above may not be a complete list of foods and beverages to avoid. Contact your dietitian for more information.  WHERE CAN I FIND MORE INFORMATION? National Heart, Lung, and Blood Institute: travelstabloid.com Document Released: 09/12/2011 Document Revised: 02/07/2014 Document Reviewed: 07/28/2013 [facility] Patient Information 2015 [place], [place]. This information is not intended to replace advice given to you by your health care provider. Make sure you discuss any questions you have with your health care provider.   I think that you would greatly benefit from seeing a nutritionist.  If you are interested, please call Dr Jenne Campus at [phone] to schedule an appointment.

## 2019-11-02 ENCOUNTER — Ambulatory Visit: Payer: Medicare HMO

## 2019-11-11 ENCOUNTER — Ambulatory Visit: Payer: Medicare HMO | Attending: Internal Medicine

## 2019-11-11 DIAGNOSIS — Z23 Encounter for immunization: Secondary | ICD-10-CM | POA: Insufficient documentation

## 2019-11-11 NOTE — Progress Notes (Signed)
   Covid-19 Vaccination Clinic  Name:  NINTI ANTILL    MRN: [id number] DOB: [birth year]  11/11/2019  Ms. Blanks was observed post Covid-19 immunization for 15 minutes without incidence. She was provided with Vaccine Information Sheet and instruction to access the V-Safe system.   Ms. Stepney was instructed to call 911 with any severe reactions post vaccine: Marland Kitchen Difficulty breathing  . Swelling of your face and throat  . A fast heartbeat  . A bad rash all over your body  . Dizziness and weakness    Immunizations Administered    Name Date Dose VIS Date Route   Pfizer COVID-19 Vaccine 11/11/2019  9:10 AM 0.3 mL 09/17/2019 Intramuscular   Manufacturer: [place]   Lot: [id number]   [place]: [id number]

## 2019-11-19 ENCOUNTER — Ambulatory Visit: Payer: Medicare HMO

## 2019-12-01 DIAGNOSIS — H2011 Chronic iridocyclitis, right eye: Secondary | ICD-10-CM | POA: Diagnosis not present

## 2019-12-01 DIAGNOSIS — H35353 Cystoid macular degeneration, bilateral: Secondary | ICD-10-CM | POA: Diagnosis not present

## 2019-12-01 DIAGNOSIS — Z961 Presence of intraocular lens: Secondary | ICD-10-CM | POA: Diagnosis not present

## 2019-12-01 DIAGNOSIS — H4063X1 Glaucoma secondary to drugs, bilateral, mild stage: Secondary | ICD-10-CM | POA: Diagnosis not present

## 2019-12-06 ENCOUNTER — Ambulatory Visit: Payer: Medicare HMO | Attending: Internal Medicine

## 2019-12-06 DIAGNOSIS — Z23 Encounter for immunization: Secondary | ICD-10-CM | POA: Insufficient documentation

## 2019-12-06 NOTE — Progress Notes (Signed)
   Covid-19 Vaccination Clinic  Name:  DESTINA MCCONVILLE    MRN: [id number] DOB: Apr 21, 1946  12/06/2019  Ms. Seldon was observed post Covid-19 immunization for 15 minutes without incidence. She was provided with Vaccine Information Sheet and instruction to access the V-Safe system.   Ms. Borges was instructed to call 911 with any severe reactions post vaccine: Marland Kitchen Difficulty breathing  . Swelling of your face and throat  . A fast heartbeat  . A bad rash all over your body  . Dizziness and weakness    Immunizations Administered    Name Date Dose VIS Date Route   Pfizer COVID-19 Vaccine 12/06/2019  1:28 PM 0.3 mL 09/17/2019 Intramuscular   Manufacturer: [place]   Lot: [id number]   [place]: [id number]

## 2019-12-10 ENCOUNTER — Encounter (INDEPENDENT_AMBULATORY_CARE_PROVIDER_SITE_OTHER): Payer: Medicare HMO | Admitting: Ophthalmology

## 2019-12-10 ENCOUNTER — Other Ambulatory Visit: Payer: Self-pay

## 2019-12-10 DIAGNOSIS — H35033 Hypertensive retinopathy, bilateral: Secondary | ICD-10-CM

## 2019-12-10 DIAGNOSIS — H34833 Tributary (branch) retinal vein occlusion, bilateral, with macular edema: Secondary | ICD-10-CM | POA: Diagnosis not present

## 2019-12-10 DIAGNOSIS — H35343 Macular cyst, hole, or pseudohole, bilateral: Secondary | ICD-10-CM

## 2019-12-10 DIAGNOSIS — I10 Essential (primary) hypertension: Secondary | ICD-10-CM | POA: Diagnosis not present

## 2020-01-04 ENCOUNTER — Other Ambulatory Visit: Payer: Self-pay

## 2020-01-04 ENCOUNTER — Ambulatory Visit (INDEPENDENT_AMBULATORY_CARE_PROVIDER_SITE_OTHER): Payer: Medicare HMO | Admitting: Family Medicine

## 2020-01-04 ENCOUNTER — Encounter: Payer: Self-pay | Admitting: Family Medicine

## 2020-01-04 VITALS — BP 164/94 | HR 74 | Temp 96.6°F | Ht 66.0 in | Wt 103.6 lb

## 2020-01-04 DIAGNOSIS — E782 Mixed hyperlipidemia: Secondary | ICD-10-CM

## 2020-01-04 DIAGNOSIS — Z130 Encounter for screening for diseases of the blood and blood-forming organs and certain disorders involving the immune mechanism: Secondary | ICD-10-CM

## 2020-01-04 DIAGNOSIS — I10 Essential (primary) hypertension: Secondary | ICD-10-CM | POA: Diagnosis not present

## 2020-01-04 MED ORDER — ATORVASTATIN CALCIUM 20 MG PO TABS: 20.0000 mg | ORAL_TABLET | Freq: Every day | ORAL | 3 refills | Status: DC

## 2020-01-04 MED ORDER — AMLODIPINE BESYLATE 2.5 MG PO TABS: 2.5000 mg | ORAL_TABLET | Freq: Two times a day (BID) | ORAL | 3 refills | Status: DC

## 2020-01-04 MED ORDER — LISINOPRIL 10 MG PO TABS: ORAL_TABLET | ORAL | 3 refills | Status: DC

## 2020-01-04 NOTE — Patient Instructions (Signed)
DASH Eating Plan DASH stands for "Dietary Approaches to Stop Hypertension." The DASH eating plan is a healthy eating plan that has been shown to reduce high blood pressure (hypertension). It may also reduce your risk for type 2 diabetes, heart disease, and stroke. The DASH eating plan may also help with weight loss. What are tips for following this plan?  General guidelines  Avoid eating more than 2,300 mg (milligrams) of salt (sodium) a day. If you have hypertension, you may need to reduce your sodium intake to 1,500 mg a day.  Limit alcohol intake to no more than 1 drink a day for nonpregnant women and 2 drinks a day for men. One drink equals 12 oz of beer, 5 oz of wine, or 1 oz of hard liquor.  Work with your health care provider to maintain a healthy body weight or to lose weight. Ask what an ideal weight is for you.  Get at least 30 minutes of exercise that causes your heart to beat faster (aerobic exercise) most days of the week. Activities may include walking, swimming, or biking.  Work with your health care provider or diet and nutrition specialist (dietitian) to adjust your eating plan to your individual calorie needs. Reading food labels   Check food labels for the amount of sodium per serving. Choose foods with less than 5 percent of the Daily Value of sodium. Generally, foods with less than 300 mg of sodium per serving fit into this eating plan.  To find whole grains, look for the word "whole" as the first word in the ingredient list. Shopping  Buy products labeled as "low-sodium" or "no salt added."  Buy fresh foods. Avoid canned foods and premade or frozen meals. Cooking  Avoid adding salt when cooking. Use salt-free seasonings or herbs instead of table salt or sea salt. Check with your health care provider or pharmacist before using salt substitutes.  Do not fry foods. Cook foods using healthy methods such as baking, boiling, grilling, and broiling instead.  Cook with  heart-healthy oils, such as olive, canola, soybean, or sunflower oil. Meal planning  Eat a balanced diet that includes: ? 5 or more servings of fruits and vegetables each day. At each meal, try to fill half of your plate with fruits and vegetables. ? Up to 6-8 servings of whole grains each day. ? Less than 6 oz of lean meat, poultry, or fish each day. A 3-oz serving of meat is about the same size as a deck of cards. One egg equals 1 oz. ? 2 servings of low-fat dairy each day. ? A serving of nuts, seeds, or beans 5 times each week. ? Heart-healthy fats. Healthy fats called Omega-3 fatty acids are found in foods such as flaxseeds and coldwater fish, like sardines, salmon, and mackerel.  Limit how much you eat of the following: ? Canned or prepackaged foods. ? Food that is high in trans fat, such as fried foods. ? Food that is high in saturated fat, such as fatty meat. ? Sweets, desserts, sugary drinks, and other foods with added sugar. ? Full-fat dairy products.  Do not salt foods before eating.  Try to eat at least 2 vegetarian meals each week.  Eat more home-cooked food and less restaurant, buffet, and fast food.  When eating at a restaurant, ask that your food be prepared with less salt or no salt, if possible. What foods are recommended? The items listed may not be a complete list. Talk with your dietitian about   what dietary choices are best for you. Grains Whole-grain or whole-wheat bread. Whole-grain or whole-wheat pasta. Brown rice. Oatmeal. Quinoa. Bulgur. Whole-grain and low-sodium cereals. Pita bread. Low-fat, low-sodium crackers. Whole-wheat flour tortillas. Vegetables Fresh or frozen vegetables (raw, steamed, roasted, or grilled). Low-sodium or reduced-sodium tomato and vegetable juice. Low-sodium or reduced-sodium tomato sauce and tomato paste. Low-sodium or reduced-sodium canned vegetables. Fruits All fresh, dried, or frozen fruit. Canned fruit in natural juice (without  added sugar). Meat and other protein foods Skinless chicken or turkey. Ground chicken or turkey. Pork with fat trimmed off. Fish and seafood. Egg whites. Dried beans, peas, or lentils. Unsalted nuts, nut butters, and seeds. Unsalted canned beans. Lean cuts of beef with fat trimmed off. Low-sodium, lean deli meat. Dairy Low-fat (1%) or fat-free (skim) milk. Fat-free, low-fat, or reduced-fat cheeses. Nonfat, low-sodium ricotta or cottage cheese. Low-fat or nonfat yogurt. Low-fat, low-sodium cheese. Fats and oils Soft margarine without trans fats. Vegetable oil. Low-fat, reduced-fat, or light mayonnaise and salad dressings (reduced-sodium). Canola, safflower, olive, soybean, and sunflower oils. Avocado. Seasoning and other foods Herbs. Spices. Seasoning mixes without salt. Unsalted popcorn and pretzels. Fat-free sweets. What foods are not recommended? The items listed may not be a complete list. Talk with your dietitian about what dietary choices are best for you. Grains Baked goods made with fat, such as croissants, muffins, or some breads. Dry pasta or rice meal packs. Vegetables Creamed or fried vegetables. Vegetables in a cheese sauce. Regular canned vegetables (not low-sodium or reduced-sodium). Regular canned tomato sauce and paste (not low-sodium or reduced-sodium). Regular tomato and vegetable juice (not low-sodium or reduced-sodium). Pickles. Olives. Fruits Canned fruit in a light or heavy syrup. Fried fruit. Fruit in cream or butter sauce. Meat and other protein foods Fatty cuts of meat. Ribs. Fried meat. Bacon. Sausage. Bologna and other processed lunch meats. Salami. Fatback. Hotdogs. Bratwurst. Salted nuts and seeds. Canned beans with added salt. Canned or smoked fish. Whole eggs or egg yolks. Chicken or turkey with skin. Dairy Whole or 2% milk, cream, and half-and-half. Whole or full-fat cream cheese. Whole-fat or sweetened yogurt. Full-fat cheese. Nondairy creamers. Whipped toppings.  Processed cheese and cheese spreads. Fats and oils Butter. Stick margarine. Lard. Shortening. Ghee. Bacon fat. Tropical oils, such as coconut, palm kernel, or palm oil. Seasoning and other foods Salted popcorn and pretzels. Onion salt, garlic salt, seasoned salt, table salt, and sea salt. Worcestershire sauce. Tartar sauce. Barbecue sauce. Teriyaki sauce. Soy sauce, including reduced-sodium. Steak sauce. Canned and packaged gravies. Fish sauce. Oyster sauce. Cocktail sauce. Horseradish that you find on the shelf. Ketchup. Mustard. Meat flavorings and tenderizers. Bouillon cubes. Hot sauce and Tabasco sauce. Premade or packaged marinades. Premade or packaged taco seasonings. Relishes. Regular salad dressings. Where to find more information:  National Heart, Lung, and Blood Institute: www.nhlbi.nih.gov  American Heart Association: www.heart.org Summary  The DASH eating plan is a healthy eating plan that has been shown to reduce high blood pressure (hypertension). It may also reduce your risk for type 2 diabetes, heart disease, and stroke.  With the DASH eating plan, you should limit salt (sodium) intake to 2,300 mg a day. If you have hypertension, you may need to reduce your sodium intake to 1,500 mg a day.  When on the DASH eating plan, aim to eat more fresh fruits and vegetables, whole grains, lean proteins, low-fat dairy, and heart-healthy fats.  Work with your health care provider or diet and nutrition specialist (dietitian) to adjust your eating plan to your   individual calorie needs. This information is not intended to replace advice given to you by your health care provider. Make sure you discuss any questions you have with your health care provider. Document Revised: 09/05/2017 Document Reviewed: 09/16/2016 Elsevier Patient Education  2020 Elsevier Inc.  

## 2020-01-04 NOTE — Progress Notes (Signed)
Assessment & Plan:  1. Essential hypertension - Education provided on the DASH diet.  - amLODipine (NORVASC) 2.5 MG tablet; Take 1 tablet (2.5 mg total) by mouth 2 (two) times daily. (Needs to see new provider)  Dispense: 180 tablet; Refill: 3 - lisinopril (ZESTRIL) 10 MG tablet; Take one tab in the AM and 2 tabs in the PM  Dispense: 270 tablet; Refill: 3 - CMP14+EGFR - Lipid panel  2. Mixed hyperlipidemia - atorvastatin (LIPITOR) 20 MG tablet; Take 1 tablet (20 mg total) by mouth daily.  Dispense: 90 tablet; Refill: 3 - CMP14+EGFR - Lipid panel  3. Screening for deficiency anemia - CBC with Differential/Platelet   Return in about 2 months (around 03/05/2020) for HTN.  Hendricks Limes, MSN, APRN, FNP-C [facility]  Subjective:    [name], female    DOB: 1946/03/06, 74 y.o.   MRN: [id number]  Patient Care Team: Loman Brooklyn, FNP as PCP - General (Family Medicine)   Chief Complaint:  Chief Complaint  Patient presents with  . Hypertension    2 month follow up    HPI: Mckenzie [name] is a 74 y.o. female presenting on 01/04/2020 for Hypertension (2 month follow up)  Patient is here for a follow-up of hypertension. She reports at home her BP ranges 125-145/70-80.  New complaints: None  Social history:  Relevant past medical, surgical, family and social history reviewed and updated as indicated. Interim medical history since our last visit reviewed.  Allergies and medications reviewed and updated.  DATA REVIEWED: CHART IN EPIC  ROS: Negative unless specifically indicated above in HPI.    Current Outpatient Medications:  .  amLODipine (NORVASC) 2.5 MG tablet, Take 1 tablet (2.5 mg total) by mouth 2 (two) times daily. (Needs to see new provider), Disp: 180 tablet, Rfl: 3 .  atorvastatin (LIPITOR) 20 MG tablet, Take 1 tablet (20 mg total) by mouth daily., Disp: 90 tablet, Rfl: 3 .  CINNAMON PO, Take 1 capsule by mouth  daily., Disp: , Rfl:  .  COMBIGAN 0.2-0.5 % ophthalmic solution, , Disp: , Rfl:  .  dorzolamide (TRUSOPT) 2 % ophthalmic solution, , Disp: , Rfl:  .  fluorometholone (FML) 0.1 % ophthalmic suspension, Place 1 drop into the right eye 4 (four) times daily., Disp: , Rfl:  .  fluticasone (FLONASE) 50 MCG/ACT nasal spray, Place 2 sprays into both nostrils daily., Disp: 16 g, Rfl: 6 .  latanoprost (XALATAN) 0.005 % ophthalmic solution, , Disp: , Rfl:  .  lisinopril (ZESTRIL) 10 MG tablet, Take one tab in the AM and 2 tabs in the PM, Disp: 270 tablet, Rfl: 3 .  metroNIDAZOLE (METROCREAM) 0.75 % cream, Apply topically 2 (two) times daily., Disp: 45 g, Rfl: 3 .  Omega-3 Fatty Acids (FISH OIL PO), Take 1 capsule by mouth daily., Disp: , Rfl:  .  PROLENSA 0.07 % SOLN, , Disp: , Rfl:  .  UNABLE TO FIND, Med Name: CBD oil, Disp: , Rfl:    Allergies  Allergen Reactions  . Aldomet [Methyldopa] Other (See Comments)    flu  . Fosamax [Alendronate Sodium]   . Acetazolamide Rash  . Buspar [Buspirone] Palpitations   Past Medical History:  Diagnosis Date  . Depression   . High cholesterol   . Hypertension   . Retinal micro-aneurysm of right eye     Past Surgical History:  Procedure Laterality Date  . EYE SURGERY    . FINGER SURGERY Left  RING FINGER  . TUBAL LIGATION      Social History   Socioeconomic History  . Marital status: Married    Spouse name: Not on file  . Number of children: Not on file  . Years of education: Not on file  . Highest education level: Not on file  Occupational History  . Not on file  Tobacco Use  . Smoking status: Never Smoker  . Smokeless tobacco: Never Used  Substance and Sexual Activity  . Alcohol use: No  . Drug use: No  . Sexual activity: Not on file  Other Topics Concern  . Not on file  Social History Narrative  . Not on file   Social Determinants of Health   Financial Resource Strain:   . Difficulty of Paying Living Expenses:   Food  Insecurity:   . Worried About Charity fundraiser in the Last Year:   . Arboriculturist in the Last Year:   Transportation Needs:   . Film/video editor (Medical):   Marland Kitchen Lack of Transportation (Non-Medical):   Physical Activity:   . Days of Exercise per Week:   . Minutes of Exercise per Session:   Stress:   . Feeling of Stress :   Social Connections:   . Frequency of Communication with Friends and Family:   . Frequency of Social Gatherings with Friends and Family:   . Attends Religious Services:   . Active Member of Clubs or Organizations:   . Attends Archivist Meetings:   Marland Kitchen Marital Status:   Intimate Partner Violence:   . Fear of Current or Ex-Partner:   . Emotionally Abused:   Marland Kitchen Physically Abused:   . Sexually Abused:         Objective:    BP (!) 164/94 Comment: manual  Pulse 74   Temp (!) 96.6 F (35.9 C) (Temporal)   Ht '5\' 6"'  (1.676 m)   Wt 103 lb 9.6 oz (47 kg)   SpO2 100%   BMI 16.72 kg/m   Wt Readings from Last 3 Encounters:  01/04/20 103 lb 9.6 oz (47 kg)  08/21/18 103 lb (46.7 kg)  02/11/18 105 lb (47.6 kg)    Physical Exam Vitals reviewed.  Constitutional:      General: She is not in acute distress.    Appearance: Normal appearance. She is underweight. She is not ill-appearing, toxic-appearing or diaphoretic.  HENT:     Head: Normocephalic and atraumatic.  Eyes:     General: No scleral icterus.       Right eye: No discharge.        Left eye: No discharge.     Conjunctiva/sclera: Conjunctivae normal.  Cardiovascular:     Rate and Rhythm: Normal rate and regular rhythm.     Heart sounds: Normal heart sounds. No murmur. No friction rub. No gallop.   Pulmonary:     Effort: Pulmonary effort is normal. No respiratory distress.     Breath sounds: Normal breath sounds. No stridor. No wheezing, rhonchi or rales.  Musculoskeletal:        General: Normal range of motion.     Cervical back: Normal range of motion.  Skin:    General: Skin is  warm and dry.     Capillary Refill: Capillary refill takes less than 2 seconds.  Neurological:     General: No focal deficit present.     Mental Status: She is alert and oriented to person, place, and time. Mental status  is at baseline.  Psychiatric:        Mood and Affect: Mood normal.        Behavior: Behavior normal.        Thought Content: Thought content normal.        Judgment: Judgment normal.     No results found for: TSH Lab Results  Component Value Date   WBC 5.4 01/04/2020   HGB 13.6 01/04/2020   HCT 41.1 01/04/2020   MCV 92 01/04/2020   PLT 260 01/04/2020   Lab Results  Component Value Date   NA 144 01/04/2020   K 4.2 01/04/2020   CO2 24 01/04/2020   GLUCOSE 104 (H) 01/04/2020   BUN 8 01/04/2020   CREATININE 0.59 01/04/2020   BILITOT 0.4 01/04/2020   ALKPHOS 98 01/04/2020   AST 17 01/04/2020   ALT 14 01/04/2020   PROT 6.3 01/04/2020   ALBUMIN 4.2 01/04/2020   CALCIUM 9.2 01/04/2020   Lab Results  Component Value Date   CHOL 151 01/04/2020   Lab Results  Component Value Date   HDL 47 01/04/2020   Lab Results  Component Value Date   LDLCALC 87 01/04/2020   Lab Results  Component Value Date   TRIG 89 01/04/2020   Lab Results  Component Value Date   CHOLHDL 3.2 01/04/2020   No results found for: HGBA1C

## 2020-01-05 LAB — CBC WITH DIFFERENTIAL/PLATELET
Basophils Absolute: 0 10*3/uL (ref 0.0–0.2)
Basos: 0 %
EOS (ABSOLUTE): 0.1 10*3/uL (ref 0.0–0.4)
Eos: 2 %
Hematocrit: 41.1 % (ref 34.0–46.6)
Hemoglobin: 13.6 g/dL (ref 11.1–15.9)
Immature Grans (Abs): 0 10*3/uL (ref 0.0–0.1)
Immature Granulocytes: 0 %
Lymphocytes Absolute: 1.1 10*3/uL (ref 0.7–3.1)
Lymphs: 20 %
MCH: 30.5 pg (ref 26.6–33.0)
MCHC: 33.1 g/dL (ref 31.5–35.7)
MCV: 92 fL (ref 79–97)
Monocytes Absolute: 0.3 10*3/uL (ref 0.1–0.9)
Monocytes: 6 %
Neutrophils Absolute: 3.8 10*3/uL (ref 1.4–7.0)
Neutrophils: 72 %
Platelets: 260 10*3/uL (ref 150–450)
RBC: 4.46 x10E6/uL (ref 3.77–5.28)
RDW: 11.8 % (ref 11.7–15.4)
WBC: 5.4 10*3/uL (ref 3.4–10.8)

## 2020-01-05 LAB — CMP14+EGFR
ALT: 14 IU/L (ref 0–32)
AST: 17 IU/L (ref 0–40)
Albumin/Globulin Ratio: 2 (ref 1.2–2.2)
Albumin: 4.2 g/dL (ref 3.7–4.7)
Alkaline Phosphatase: 98 IU/L (ref 39–117)
BUN/Creatinine Ratio: 14 (ref 12–28)
BUN: 8 mg/dL (ref 8–27)
Bilirubin Total: 0.4 mg/dL (ref 0.0–1.2)
CO2: 24 mmol/L (ref 20–29)
Calcium: 9.2 mg/dL (ref 8.7–10.3)
Chloride: 106 mmol/L (ref 96–106)
Creatinine, Ser: 0.59 mg/dL (ref 0.57–1.00)
GFR calc Af Amer: 104 mL/min/{1.73_m2} (ref >59)
GFR calc non Af Amer: 91 mL/min/{1.73_m2} (ref >59)
Globulin, Total: 2.1 g/dL (ref 1.5–4.5)
Glucose: 104 mg/dL — ABNORMAL HIGH (ref 65–99)
Potassium: 4.2 mmol/L (ref 3.5–5.2)
Sodium: 144 mmol/L (ref 134–144)
Total Protein: 6.3 g/dL (ref 6.0–8.5)

## 2020-01-05 LAB — LIPID PANEL
Chol/HDL Ratio: 3.2 ratio (ref 0.0–4.4)
Cholesterol, Total: 151 mg/dL (ref 100–199)
HDL: 47 mg/dL (ref >39)
LDL Chol Calc (NIH): 87 mg/dL (ref 0–99)
Triglycerides: 89 mg/dL (ref 0–149)
VLDL Cholesterol Cal: 17 mg/dL (ref 5–40)

## 2020-03-07 ENCOUNTER — Encounter: Payer: Self-pay | Admitting: Family Medicine

## 2020-03-07 ENCOUNTER — Other Ambulatory Visit: Payer: Self-pay

## 2020-03-07 ENCOUNTER — Ambulatory Visit (INDEPENDENT_AMBULATORY_CARE_PROVIDER_SITE_OTHER): Payer: Medicare HMO | Admitting: Family Medicine

## 2020-03-07 VITALS — BP 175/86 | HR 88 | Temp 97.5°F | Ht 66.0 in | Wt 104.8 lb

## 2020-03-07 DIAGNOSIS — I10 Essential (primary) hypertension: Secondary | ICD-10-CM | POA: Diagnosis not present

## 2020-03-07 NOTE — Progress Notes (Signed)
Assessment & Plan:  1. Essential hypertension - Well controlled on current regimen. Elevated today but controlled at home. No changes to medications.    Return in about 1 year (around 03/07/2021) for annual physical.  Hendricks Limes, MSN, APRN, FNP-C [facility]  Subjective:    [name], female    DOB: [birth year], 74 y.o.   MRN: [id number]  Patient Care Team: Loman Brooklyn, FNP as PCP - General (Family Medicine)   Chief Complaint:  Chief Complaint  Patient presents with  . Hypertension    2 month follow up     HPI: Mckenzie [name] is a 74 y.o. female presenting on 03/07/2020 for Hypertension (2 month follow up )  Hypertension: Patient here for follow-up of elevated blood pressure. She is exercising and is adherent to low salt diet.  Blood pressure is well controlled at home. She has been keeping a random check on her BP over the past couple of months. Systolic ranges [id number] with 2/8. Diastolic ranges 0000000. Cardiac symptoms none. Cardiovascular risk factors: advanced age (older than 41 for men, 65 for women), dyslipidemia and hypertension. Use of agents associated with hypertension: none. History of target organ damage: none.   New complaints: None  Social history:  Relevant past medical, surgical, family and social history reviewed and updated as indicated. Interim medical history since our last visit reviewed.  Allergies and medications reviewed and updated.  DATA REVIEWED: CHART IN EPIC  ROS: Negative unless specifically indicated above in HPI.    Current Outpatient Medications:  .  amLODipine (NORVASC) 2.5 MG tablet, Take 1 tablet (2.5 mg total) by mouth 2 (two) times daily. (Needs to see new provider), Disp: 180 tablet, Rfl: 3 .  atorvastatin (LIPITOR) 20 MG tablet, Take 1 tablet (20 mg total) by mouth daily., Disp: 90 tablet, Rfl: 3 .  CINNAMON PO, Take 1 capsule by mouth daily., Disp: , Rfl:  .  COMBIGAN 0.2-0.5 %  ophthalmic solution, , Disp: , Rfl:  .  dorzolamide (TRUSOPT) 2 % ophthalmic solution, , Disp: , Rfl:  .  fluorometholone (FML) 0.1 % ophthalmic suspension, Place 1 drop into the right eye 4 (four) times daily., Disp: , Rfl:  .  fluticasone (FLONASE) 50 MCG/ACT nasal spray, Place 2 sprays into both nostrils daily., Disp: 16 g, Rfl: 6 .  latanoprost (XALATAN) 0.005 % ophthalmic solution, , Disp: , Rfl:  .  lisinopril (ZESTRIL) 10 MG tablet, Take one tab in the AM and 2 tabs in the PM, Disp: 270 tablet, Rfl: 3 .  metroNIDAZOLE (METROCREAM) 0.75 % cream, Apply topically 2 (two) times daily., Disp: 45 g, Rfl: 3 .  Omega-3 Fatty Acids (FISH OIL PO), Take 1 capsule by mouth daily., Disp: , Rfl:  .  PROLENSA 0.07 % SOLN, , Disp: , Rfl:  .  UNABLE TO FIND, Med Name: CBD oil, Disp: , Rfl:    Allergies  Allergen Reactions  . Aldomet [Methyldopa] Other (See Comments)    flu  . Fosamax [Alendronate Sodium]   . Acetazolamide Rash  . Buspar [Buspirone] Palpitations   Past Medical History:  Diagnosis Date  . Depression   . High cholesterol   . Hypertension   . Retinal micro-aneurysm of right eye     Past Surgical History:  Procedure Laterality Date  . EYE SURGERY    . FINGER SURGERY Left    RING FINGER  . TUBAL LIGATION      Social History  Socioeconomic History  . Marital status: Married    Spouse name: Not on file  . Number of children: Not on file  . Years of education: Not on file  . Highest education level: Not on file  Occupational History  . Not on file  Tobacco Use  . Smoking status: Never Smoker  . Smokeless tobacco: Never Used  Substance and Sexual Activity  . Alcohol use: No  . Drug use: No  . Sexual activity: Not on file  Other Topics Concern  . Not on file  Social History Narrative  . Not on file   Social Determinants of Health   Financial Resource Strain:   . Difficulty of Paying Living Expenses:   Food Insecurity:   . Worried About Charity fundraiser in  the Last Year:   . Arboriculturist in the Last Year:   Transportation Needs:   . Film/video editor (Medical):   Marland Kitchen Lack of Transportation (Non-Medical):   Physical Activity:   . Days of Exercise per Week:   . Minutes of Exercise per Session:   Stress:   . Feeling of Stress :   Social Connections:   . Frequency of Communication with Friends and Family:   . Frequency of Social Gatherings with Friends and Family:   . Attends Religious Services:   . Active Member of Clubs or Organizations:   . Attends Archivist Meetings:   Marland Kitchen Marital Status:   Intimate Partner Violence:   . Fear of Current or Ex-Partner:   . Emotionally Abused:   Marland Kitchen Physically Abused:   . Sexually Abused:         Objective:    BP (!) 175/86   Pulse 88   Temp (!) 97.5 F (36.4 C) (Temporal)   Ht 5\' 6"  (1.676 m)   Wt 104 lb 12.8 oz (47.5 kg)   SpO2 100%   BMI 16.92 kg/m   Wt Readings from Last 3 Encounters:  03/07/20 104 lb 12.8 oz (47.5 kg)  01/04/20 103 lb 9.6 oz (47 kg)  08/21/18 103 lb (46.7 kg)    Physical Exam Vitals reviewed.  Constitutional:      General: She is not in acute distress.    Appearance: Normal appearance. She is underweight. She is not ill-appearing, toxic-appearing or diaphoretic.  HENT:     Head: Normocephalic and atraumatic.  Eyes:     General: No scleral icterus.       Right eye: No discharge.        Left eye: No discharge.     Conjunctiva/sclera: Conjunctivae normal.  Cardiovascular:     Rate and Rhythm: Normal rate and regular rhythm.     Heart sounds: Normal heart sounds. No murmur. No friction rub. No gallop.   Pulmonary:     Effort: Pulmonary effort is normal. No respiratory distress.     Breath sounds: Normal breath sounds. No stridor. No wheezing, rhonchi or rales.  Musculoskeletal:        General: Normal range of motion.     Cervical back: Normal range of motion.  Skin:    General: Skin is warm and dry.     Capillary Refill: Capillary refill  takes less than 2 seconds.  Neurological:     General: No focal deficit present.     Mental Status: She is alert and oriented to person, place, and time. Mental status is at baseline.  Psychiatric:        Mood and  Affect: Mood normal.        Behavior: Behavior normal.        Thought Content: Thought content normal.        Judgment: Judgment normal.     No results found for: TSH Lab Results  Component Value Date   WBC 5.4 01/04/2020   HGB 13.6 01/04/2020   HCT 41.1 01/04/2020   MCV 92 01/04/2020   PLT 260 01/04/2020   Lab Results  Component Value Date   NA 144 01/04/2020   K 4.2 01/04/2020   CO2 24 01/04/2020   GLUCOSE 104 (H) 01/04/2020   BUN 8 01/04/2020   CREATININE 0.59 01/04/2020   BILITOT 0.4 01/04/2020   ALKPHOS 98 01/04/2020   AST 17 01/04/2020   ALT 14 01/04/2020   PROT 6.3 01/04/2020   ALBUMIN 4.2 01/04/2020   CALCIUM 9.2 01/04/2020   Lab Results  Component Value Date   CHOL 151 01/04/2020   Lab Results  Component Value Date   HDL 47 01/04/2020   Lab Results  Component Value Date   LDLCALC 87 01/04/2020   Lab Results  Component Value Date   TRIG 89 01/04/2020   Lab Results  Component Value Date   CHOLHDL 3.2 01/04/2020   No results found for: HGBA1C

## 2020-03-07 NOTE — Patient Instructions (Signed)
Blood pressure goal is < 150/90

## 2020-03-29 DIAGNOSIS — D1801 Hemangioma of skin and subcutaneous tissue: Secondary | ICD-10-CM | POA: Diagnosis not present

## 2020-03-29 DIAGNOSIS — D229 Melanocytic nevi, unspecified: Secondary | ICD-10-CM | POA: Diagnosis not present

## 2020-03-29 DIAGNOSIS — L718 Other rosacea: Secondary | ICD-10-CM | POA: Diagnosis not present

## 2020-03-29 DIAGNOSIS — L570 Actinic keratosis: Secondary | ICD-10-CM | POA: Diagnosis not present

## 2020-03-29 DIAGNOSIS — L819 Disorder of pigmentation, unspecified: Secondary | ICD-10-CM | POA: Diagnosis not present

## 2020-03-29 DIAGNOSIS — L814 Other melanin hyperpigmentation: Secondary | ICD-10-CM | POA: Diagnosis not present

## 2020-03-29 DIAGNOSIS — L821 Other seborrheic keratosis: Secondary | ICD-10-CM | POA: Diagnosis not present

## 2020-04-05 DIAGNOSIS — H4063X1 Glaucoma secondary to drugs, bilateral, mild stage: Secondary | ICD-10-CM | POA: Diagnosis not present

## 2020-04-05 DIAGNOSIS — H2011 Chronic iridocyclitis, right eye: Secondary | ICD-10-CM | POA: Diagnosis not present

## 2020-04-05 DIAGNOSIS — H35353 Cystoid macular degeneration, bilateral: Secondary | ICD-10-CM | POA: Diagnosis not present

## 2020-04-05 DIAGNOSIS — Z961 Presence of intraocular lens: Secondary | ICD-10-CM | POA: Diagnosis not present

## 2020-04-18 DIAGNOSIS — H2011 Chronic iridocyclitis, right eye: Secondary | ICD-10-CM | POA: Diagnosis not present

## 2020-04-18 DIAGNOSIS — H35353 Cystoid macular degeneration, bilateral: Secondary | ICD-10-CM | POA: Diagnosis not present

## 2020-04-18 DIAGNOSIS — H4063X1 Glaucoma secondary to drugs, bilateral, mild stage: Secondary | ICD-10-CM | POA: Diagnosis not present

## 2020-04-18 DIAGNOSIS — Z961 Presence of intraocular lens: Secondary | ICD-10-CM | POA: Diagnosis not present

## 2020-04-26 ENCOUNTER — Encounter (INDEPENDENT_AMBULATORY_CARE_PROVIDER_SITE_OTHER): Payer: Medicare HMO | Admitting: Ophthalmology

## 2020-05-03 ENCOUNTER — Other Ambulatory Visit: Payer: Self-pay

## 2020-05-03 ENCOUNTER — Encounter (INDEPENDENT_AMBULATORY_CARE_PROVIDER_SITE_OTHER): Payer: Medicare HMO | Admitting: Ophthalmology

## 2020-05-03 DIAGNOSIS — H34833 Tributary (branch) retinal vein occlusion, bilateral, with macular edema: Secondary | ICD-10-CM

## 2020-05-03 DIAGNOSIS — H35033 Hypertensive retinopathy, bilateral: Secondary | ICD-10-CM | POA: Diagnosis not present

## 2020-05-03 DIAGNOSIS — I10 Essential (primary) hypertension: Secondary | ICD-10-CM

## 2020-05-10 DIAGNOSIS — H2011 Chronic iridocyclitis, right eye: Secondary | ICD-10-CM | POA: Diagnosis not present

## 2020-05-10 DIAGNOSIS — H4063X1 Glaucoma secondary to drugs, bilateral, mild stage: Secondary | ICD-10-CM | POA: Diagnosis not present

## 2020-05-10 DIAGNOSIS — H35353 Cystoid macular degeneration, bilateral: Secondary | ICD-10-CM | POA: Diagnosis not present

## 2020-05-10 DIAGNOSIS — Z961 Presence of intraocular lens: Secondary | ICD-10-CM | POA: Diagnosis not present

## 2020-07-25 ENCOUNTER — Ambulatory Visit (INDEPENDENT_AMBULATORY_CARE_PROVIDER_SITE_OTHER): Payer: Medicare HMO | Admitting: Family Medicine

## 2020-07-25 ENCOUNTER — Encounter: Payer: Self-pay | Admitting: Family Medicine

## 2020-07-25 DIAGNOSIS — M542 Cervicalgia: Secondary | ICD-10-CM

## 2020-07-25 MED ORDER — CYCLOBENZAPRINE HCL 10 MG PO TABS: 10.0000 mg | ORAL_TABLET | Freq: Every day | ORAL | 2 refills | Status: DC

## 2020-07-25 MED ORDER — PREDNISONE 10 MG PO TABS: ORAL_TABLET | ORAL | 0 refills | Status: DC

## 2020-07-25 NOTE — Progress Notes (Signed)
    Subjective:    [name], female    DOB: August 30, 1946, 74 y.o.   MRN: [id number]   HPI: Mckenzie [name] is a 74 y.o. female presenting for neck and shoulder pain. Has to lift her 75 y/o and 66 y/o grand daughters. Two weeks ago. Felt a pull in her neck with lifting one of them. Has had pain in neck ever since. Moderately severe ache at base of neck radiating to the left shoulder. Almost fell in the bathroom last night as a result of the pain. May have reinjured it then. Can't get any rest due to the pain.     Depression screen [facility] 2/9 03/07/2020 01/04/2020 08/21/2018 02/11/2018 01/27/2017  Decreased Interest 0 0 0 0 0  Down, Depressed, Hopeless 0 0 0 0 0  PHQ - 2 Score 0 0 0 0 0     Relevant past medical, surgical, family and social history reviewed and updated as indicated.  Interim medical history since our last visit reviewed. Allergies and medications reviewed and updated.  ROS:  Review of Systems  Noncontributory except as noted in HPI Social History   Tobacco Use  Smoking Status Never Smoker  Smokeless Tobacco Never Used       Objective:     Wt Readings from Last 3 Encounters:  03/07/20 104 lb 12.8 oz (47.5 kg)  01/04/20 103 lb 9.6 oz (47 kg)  08/21/18 103 lb (46.7 kg)     Exam deferred. Pt. Harboring due to COVID 19. Phone visit performed.   Assessment & Plan:   1. Cervicalgia     Meds ordered this encounter  Medications  . cyclobenzaprine (FLEXERIL) 10 MG tablet    Sig: Take 1 tablet (10 mg total) by mouth at bedtime. To relax muscles    Dispense:  30 tablet    Refill:  2  . predniSONE (DELTASONE) 10 MG tablet    Sig: Take 5 daily for 2 days followed by 4,3,2 and 1 for 2 days each.    Dispense:  30 tablet    Refill:  0    Apply heat as needed. Limit lifting to a minimum.    Diagnoses and all orders for this visit:  Cervicalgia  Other orders -     cyclobenzaprine (FLEXERIL) 10 MG tablet; Take 1 tablet (10 mg total) by mouth at  bedtime. To relax muscles -     predniSONE (DELTASONE) 10 MG tablet; Take 5 daily for 2 days followed by 4,3,2 and 1 for 2 days each.    Virtual Visit via telephone Note  I discussed the limitations, risks, security and privacy concerns of performing an evaluation and management service by telephone and the availability of in person appointments. The patient was identified with two identifiers. Pt.expressed understanding and agreed to proceed. Pt. Is at home. Dr. Livia Snellen is in his office.  Follow Up Instructions:   I discussed the assessment and treatment plan with the patient. The patient was provided an opportunity to ask questions and all were answered. The patient agreed with the plan and demonstrated an understanding of the instructions.   The patient was advised to call back or seek an in-person evaluation if the symptoms worsen or if the condition fails to improve as anticipated.   Total minutes including chart review and phone contact time: 16   Follow up plan: Return if symptoms worsen or fail to improve.  Claretta Fraise, MD [place]

## 2020-07-26 ENCOUNTER — Telehealth: Payer: Self-pay | Admitting: *Deleted

## 2020-07-26 NOTE — Telephone Encounter (Signed)
Key: [id number] - PA Case ID: [id number] - Rx #: [id number]  Drug Cyclobenzaprine HCl 10MG  tablets Form [employer] Electronic PA Form [phone] NCPDP) Original Claim Info [id number]  This approval authorizes your coverage from 10/08/2019 - 10/24/2020, unless we notify you  otherwise, and as long as the following conditions apply: . you remain enrolled in our Medicare Part D prescription drug plan, . your physician or other prescriber continues to prescribe the medication for you, and . the medication continues to be safe for treating your condition.  Pharmacy notified

## 2020-07-31 DIAGNOSIS — R69 Illness, unspecified: Secondary | ICD-10-CM | POA: Diagnosis not present

## 2020-09-06 DIAGNOSIS — H4063X1 Glaucoma secondary to drugs, bilateral, mild stage: Secondary | ICD-10-CM | POA: Diagnosis not present

## 2020-09-06 DIAGNOSIS — H35353 Cystoid macular degeneration, bilateral: Secondary | ICD-10-CM | POA: Diagnosis not present

## 2020-09-06 DIAGNOSIS — H2011 Chronic iridocyclitis, right eye: Secondary | ICD-10-CM | POA: Diagnosis not present

## 2020-09-06 DIAGNOSIS — Z961 Presence of intraocular lens: Secondary | ICD-10-CM | POA: Diagnosis not present

## 2020-09-07 DIAGNOSIS — M545 Low back pain, unspecified: Secondary | ICD-10-CM | POA: Diagnosis not present

## 2020-09-07 DIAGNOSIS — M5459 Other low back pain: Secondary | ICD-10-CM | POA: Diagnosis not present

## 2020-09-07 DIAGNOSIS — M542 Cervicalgia: Secondary | ICD-10-CM | POA: Diagnosis not present

## 2020-09-08 ENCOUNTER — Other Ambulatory Visit: Payer: Self-pay

## 2020-09-08 ENCOUNTER — Other Ambulatory Visit: Payer: Medicare HMO

## 2020-09-08 DIAGNOSIS — M5459 Other low back pain: Secondary | ICD-10-CM | POA: Diagnosis not present

## 2020-09-11 ENCOUNTER — Other Ambulatory Visit: Payer: Self-pay

## 2020-09-11 MED ORDER — LISINOPRIL 20 MG PO TABS: 20.0000 mg | ORAL_TABLET | Freq: Every day | ORAL | 0 refills | Status: DC

## 2020-09-11 NOTE — Telephone Encounter (Signed)
  Prescription Request  09/11/2020  What is the name of the medication or equipment? Lisinopril 20 she has the 10  Have you contacted your pharmacy to request a refill? (if applicable) yes  Which pharmacy would you like this sent to? [facility]   Patient notified that their request is being sent to the clinical staff for review and that they should receive a response within 2 business days.

## 2020-09-11 NOTE — Telephone Encounter (Signed)
Refill sent.

## 2020-09-26 ENCOUNTER — Telehealth: Payer: Self-pay | Admitting: *Deleted

## 2020-09-26 ENCOUNTER — Encounter: Payer: Self-pay | Admitting: Neurology

## 2020-09-26 ENCOUNTER — Ambulatory Visit: Payer: Medicare HMO | Admitting: Neurology

## 2020-09-26 VITALS — BP 175/87 | HR 101 | Ht 64.5 in | Wt 103.5 lb

## 2020-09-26 DIAGNOSIS — Y92009 Unspecified place in unspecified non-institutional (private) residence as the place of occurrence of the external cause: Secondary | ICD-10-CM | POA: Insufficient documentation

## 2020-09-26 DIAGNOSIS — H4050X0 Glaucoma secondary to other eye disorders, unspecified eye, stage unspecified: Secondary | ICD-10-CM | POA: Diagnosis not present

## 2020-09-26 DIAGNOSIS — W19XXXD Unspecified fall, subsequent encounter: Secondary | ICD-10-CM | POA: Insufficient documentation

## 2020-09-26 DIAGNOSIS — M6289 Other specified disorders of muscle: Secondary | ICD-10-CM

## 2020-09-26 DIAGNOSIS — M791 Myalgia, unspecified site: Secondary | ICD-10-CM

## 2020-09-26 DIAGNOSIS — W19XXXA Unspecified fall, initial encounter: Secondary | ICD-10-CM

## 2020-09-26 DIAGNOSIS — I10 Essential (primary) hypertension: Secondary | ICD-10-CM

## 2020-09-26 NOTE — Telephone Encounter (Signed)
Called and LVM for [facility] letting them know we did not receive lab results that we requested earlier for Dr. Felecia Shelling to review. Advised Elmyra Ricks spoke with [facility] earlier. Asked they send results to fax [phone]. Elmyra Ricks also faxed request again [phone]. Received fax confirmation.

## 2020-09-26 NOTE — Progress Notes (Signed)
[facility]  PATIENT: Mckenzie [name] DOB: 1945-12-28  REFERRING DOCTOR OR PCP:  Dr. Gladstone Lighter ([facility]); Hendricks Limes. FNP (PCP) SOURCE: Patient, notes from Dr. Gladstone Lighter, available lab work  _________________________________   HISTORICAL  CHIEF COMPLAINT:  Chief Complaint  Patient presents with  . New Patient (Initial Visit)    RM 13. Paper referral from Latanya Maudlin, MD for confusion, balance issues, unable to stand, falling/unsteady. MOCA today, 25/30  . Cerebrovascular Accident    HISTORY OF PRESENT ILLNESS:  I had the pleasure of seeing patient, Mckenzie [name], at [facility] for neurologic consultation regarding her proximal weakness and episode of more pronounced weakness that was associated with some confusion.  She is a 74 year old woman with myalgias who has noted proximal weakness since an episode of more significant weakness followed by a fall and confusion but not LOC in October.  She was noticing myalgias in October and felt like she pulled some muscles in her left leg.  She went shopping with her daughter and was standing up for 2 hours.  She began to feel worse in the left leg gave out.  She was very hot at the time and felt the weakness came on over minutes not seconds.    She became unsteady and the left leg gave out on her.    She felt confused after she had fallen while in the store about the time she fell and for the next few minutes but then felt back to baseline afterwards.   There was no LOC  Since the incident, she notes proximal muscle weakness, especially in her hips and now needs to use her arms to stand up.  She also notes some mild weakness in her neck and shoulders..    She has had more pain in the muscles of the neck, shoulder and legs/hips since October.   Pain was worse the day she did more activity and felt weaker.    She feels she is sleeping worse due to the pain.  She is currently taking Tylenol for  pain.    She had a phone visit with Dr. Livia Snellen and she was prescribed prednisone but did not take it as she was told not to take prednisone by ophthalmology as it might raise the intraocular pressure.     She has ben told not to take NSAIDs due to HTN.    Tylenol has not helped the pain much.  She is thin and has no appetite.  She has lost 2 pounds over the past few months.   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     She has not had no rashes.  Urine is not darker.     She saw Dr. Gladstone Lighter ([facility]) who checked inflammatory labs including ESR, CRP, ANA, uric acid, SPEP/IEF and UPEP.   She was told labs were unremarkable.   At the time of the visit we did not have the actual results of the lab work and they have been requested.  She reports a long history of ophthalmologic issues and has had cataract surgery, macular degeneration, secondary glaucoma  She had fluid leak after a shot.   Other surgery is being considered.   Vision is poor OD and reduced OS.     Montreal Cognitive Assessment  09/26/2020  Visuospatial/ Executive (0/5) 3  Naming (0/3) 3  Attention: Read list of digits (0/2) 2  Attention: Read list of letters (0/1) 1  Attention:  Serial 7 subtraction starting at 100 (0/3) 3  Language: Repeat phrase (0/2) 1  Language : Fluency (0/1) 1  Abstraction (0/2) 2  Delayed Recall (0/5) 2  Orientation (0/6) 6  Total 24  Adjusted Score (based on education) 25     REVIEW OF SYSTEMS: Constitutional: No fevers, chills, sweats, or change in appetite Eyes: No visual changes, double vision, eye pain Ear, nose and throat: No hearing loss, ear pain, nasal congestion, sore throat Cardiovascular: No chest pain, palpitations Respiratory: No shortness of breath at rest or with exertion.   No wheezes GastrointestinaI: No nausea, vomiting, diarrhea, abdominal pain, fecal incontinence Genitourinary: No dysuria, urinary retention or frequency.  No nocturia. Musculoskeletal: No neck  pain, back pain Integumentary: No rash, pruritus, skin lesions Neurological: as above Psychiatric: No depression at this time.  No anxiety Endocrine: No palpitations, diaphoresis, change in appetite, change in weigh or increased thirst Hematologic/Lymphatic: No anemia, purpura, petechiae. Allergic/Immunologic: No itchy/runny eyes, nasal congestion, recent allergic reactions, rashes  ALLERGIES: Allergies  Allergen Reactions  . Aldomet [Methyldopa] Other (See Comments)    flu  . Fosamax [Alendronate Sodium]   . Acetazolamide Rash  . Buspar [Buspirone] Palpitations    HOME MEDICATIONS:  Current Outpatient Medications:  .  amLODipine (NORVASC) 2.5 MG tablet, Take 1 tablet (2.5 mg total) by mouth 2 (two) times daily. (Needs to see new provider), Disp: 180 tablet, Rfl: 3 .  COMBIGAN 0.2-0.5 % ophthalmic solution, Place 1 drop into both eyes every 12 (twelve) hours., Disp: , Rfl:  .  dorzolamide (TRUSOPT) 2 % ophthalmic solution, Place 1 drop into both eyes 2 (two) times daily., Disp: , Rfl:  .  fluorometholone (FML) 0.1 % ophthalmic suspension, Place 1 drop into the right eye 4 (four) times daily., Disp: , Rfl:  .  hydroxypropyl methylcellulose / hypromellose (ISOPTO TEARS / GONIOVISC) 2.5 % ophthalmic solution, Place 1 drop into both eyes., Disp: , Rfl:  .  latanoprost (XALATAN) 0.005 % ophthalmic solution, , Disp: , Rfl:  .  lisinopril (ZESTRIL) 10 MG tablet, Take one tab in the AM and 2 tabs in the PM, Disp: 270 tablet, Rfl: 3 .  lisinopril (ZESTRIL) 20 MG tablet, Take 1 tablet (20 mg total) by mouth daily., Disp: 90 tablet, Rfl: 0 .  Netarsudil Dimesylate (RHOPRESSA) 0.02 % SOLN, Apply 1 drop to eye., Disp: , Rfl:  .  PROLENSA 0.07 % SOLN, Place 1 drop into the right eye daily., Disp: , Rfl:   PAST MEDICAL HISTORY: Past Medical History:  Diagnosis Date  . Depression   . High cholesterol   . Hypertension   . Retinal micro-aneurysm of right eye     PAST SURGICAL HISTORY: Past  Surgical History:  Procedure Laterality Date  . EYE SURGERY    . FINGER SURGERY Left    RING FINGER  . TUBAL LIGATION      FAMILY HISTORY: Family History  Problem Relation Age of Onset  . Hyperlipidemia Mother   . Hypertension Mother   . Diabetes Mother   . COPD Father     SOCIAL HISTORY:  Social History   Socioeconomic History  . Marital status: Married    Spouse name: Not on file  . Number of children: 3  . Years of education: 48  . Highest education level: Not on file  Occupational History  . Not on file  Tobacco Use  . Smoking status: Never Smoker  . Smokeless tobacco: Never Used  Vaping Use  . Vaping Use:  Never used  Substance and Sexual Activity  . Alcohol use: No  . Drug use: No  . Sexual activity: Not on file  Other Topics Concern  . Not on file  Social History Narrative   Right handed   Caffeine use: coffee daily   Social Determinants of Health   Financial Resource Strain: Not on file  Food Insecurity: Not on file  Transportation Needs: Not on file  Physical Activity: Not on file  Stress: Not on file  Social Connections: Not on file  Intimate Partner Violence: Not on file     PHYSICAL EXAM  Vitals:   09/26/20 1406  BP: (!) 175/87  Pulse: (!) 101  Weight: 103 lb 8 oz (46.9 kg)  Height: 5' 4.5" (1.638 m)    Body mass index is 17.49 kg/m.   General: The patient is well-developed and well-nourished and in no acute distress  HEENT:  Head is [place]/AT.  Sclera are anicteric.  Funduscopic exam shows normal optic discs and retinal vessels.  Neck: No carotid bruits are noted.  The neck is tender over the paraspinal and other muscles.  Range of motion was normal in the neck.  Also tender in the quadriceps, deltoids and chest/upper back muscles  Cardiovascular: The heart has a regular rate and rhythm with a normal S1 and S2. There were no murmurs, gallops or rubs.    Skin: Extremities are without rash or  edema.  Musculoskeletal:  Back is  nontender  Neurologic Exam  Mental status: The patient is alert and oriented x 3 at the time of the examination. The patient has apparent normal recent and remote memory, with an apparently normal attention span and concentration ability.   Speech is normal.  Cranial nerves: Extraocular movements are full. Pupils are equal, round, and reactive to light and accomodation.  Visual fields are full.  Facial symmetry is present. There is good facial sensation to soft touch bilaterally.Facial strength is normal.  Trapezius and sternocleidomastoid strength is normal. No dysarthria is noted.  The tongue is midline, and the patient has symmetric elevation of the soft palate. No obvious hearing deficits are noted.  Motor: Muscle bulk and tone are normal.  Strength was 4/5 in the iliopsoas muscles, 4+/5 in the quadriceps and shoulder and neck extensor muscles.  Strength is 5/5 distally..   Sensory: Sensory testing is intact to pinprick, soft touch and vibration sensation in all 4 extremities.  Coordination: Cerebellar testing reveals good finger-nose-finger and heel-to-shin bilaterally.  Gait and station: Station is normal.   Gait is normal. Tandem gait is normal. Romberg is negative.   Reflexes: Deep tendon reflexes are symmetric and normal bilaterally.   Plantar responses are flexor.    DIAGNOSTIC DATA (LABS, IMAGING, TESTING) - I reviewed patient records, labs, notes, testing and imaging myself where available.  Lab Results  Component Value Date   WBC 5.4 01/04/2020   HGB 13.6 01/04/2020   HCT 41.1 01/04/2020   MCV 92 01/04/2020   PLT 260 01/04/2020      Component Value Date/Time   NA 144 01/04/2020 0852   K 4.2 01/04/2020 0852   CL 106 01/04/2020 0852   CO2 24 01/04/2020 0852   GLUCOSE 104 (H) 01/04/2020 0852   GLUCOSE 109 (H) 01/23/2018 0803   BUN 8 01/04/2020 0852   CREATININE 0.59 01/04/2020 0852   CREATININE 0.63 01/23/2018 0803   CALCIUM 9.2 01/04/2020 0852   PROT 6.3  01/04/2020 0852   ALBUMIN 4.2 01/04/2020 1657  AST 17 01/04/2020 0852   ALT 14 01/04/2020 0852   ALKPHOS 98 01/04/2020 0852   BILITOT 0.4 01/04/2020 0852   GFRNONAA 91 01/04/2020 0852   GFRNONAA 90 01/23/2018 0803   GFRAA 104 01/04/2020 0852   GFRAA 104 01/23/2018 0803   Lab Results  Component Value Date   CHOL 151 01/04/2020   HDL 47 01/04/2020   LDLCALC 87 01/04/2020   TRIG 89 01/04/2020   CHOLHDL 3.2 01/04/2020       ASSESSMENT AND PLAN  Proximal weakness of extremity - Plan: CK, Acetylcholine receptor, binding, TSH  Myalgia - Plan: CK, TSH  Primary hypertension  Fall, initial encounter  Secondary glaucoma, unspecified glaucoma stage, unspecified laterality   In summary, Mckenzie [name] is a 74 year old woman who has had myalgias and proximal muscle weakness for the past 3 months that appear to worsen 1 day after she was standing for several hours.  I am most concerned about an inflammatory process, especially polymyositis as it could also cause myalgia.  I would also be concerned about polymyalgia rheumatica but apparently ESR and CRP within normal recently (we have requested lab work from orthopedics but have not received the actual values but patient states daughter was called and the results were normal).  I will check creatine kinase, TSH and antiacetylcholine receptor antibody tests to rule out some treatable causes of proximal weakness.   Normally, with her symptoms, I would recommend a short course of steroids but she has been told not to take these.  Additionally because of hypertension she prefers not to take anti-inflammatories.  Therefore she should continue to take the Tylenol.  Based on the lab results we may need to consider treatment with steroids or IVIg if she does have evidence of anti-inflammatory myopathy.  I do not think she has had a stroke as her symptoms are bilateral.  She will be having an MRI of the lumbar spine seen.  She will return to see me in 3  months or sooner if there are new or worsening neurologic symptoms.   Jashayla Glatfelter A. Felecia Shelling, MD, [facility] 03/00/9233, 0:07 PM Certified in Neurology, Clinical Neurophysiology, Sleep Medicine and Neuroimaging  [facility] [address] [place], [place] [address] [phone]

## 2020-09-27 LAB — ACETYLCHOLINE RECEPTOR, BINDING: AChR Binding Ab, Serum: 0.05 nmol/L (ref 0.00–0.24)

## 2020-09-27 LAB — CK: Total CK: 30 U/L — ABNORMAL LOW (ref 32–182)

## 2020-09-27 LAB — TSH: TSH: 2.1 u[IU]/mL (ref 0.450–4.500)

## 2020-10-02 NOTE — Telephone Encounter (Signed)
Received lab results from [facility]. Given to Dr. Epimenio Foot to review upon his return next week.

## 2020-10-02 NOTE — Telephone Encounter (Signed)
We only received our fax cover sheet back from [facility]. I re-faxed/marked urgent asking that lab results from Dr. Darrelyn Hillock be faxed to [place] at [phone]. Advised we do not need OV notes. Received confirmation.

## 2020-10-02 NOTE — Telephone Encounter (Signed)
Pt will call [facility] to have them fax results to [phone]

## 2020-10-04 DIAGNOSIS — M5416 Radiculopathy, lumbar region: Secondary | ICD-10-CM | POA: Diagnosis not present

## 2020-10-05 ENCOUNTER — Encounter (HOSPITAL_COMMUNITY)
Admission: EM | Disposition: A | Discharge status: Home / Self Care | Payer: Self-pay | Source: Home / Self Care | Attending: Emergency Medicine

## 2020-10-05 ENCOUNTER — Inpatient Hospital Stay (HOSPITAL_COMMUNITY): Payer: Medicare HMO

## 2020-10-05 ENCOUNTER — Emergency Department (HOSPITAL_COMMUNITY): Payer: Medicare HMO

## 2020-10-05 ENCOUNTER — Inpatient Hospital Stay (HOSPITAL_COMMUNITY): Payer: Medicare HMO | Admitting: Certified Registered Nurse Anesthetist

## 2020-10-05 ENCOUNTER — Encounter (HOSPITAL_COMMUNITY): Payer: Self-pay | Admitting: Emergency Medicine

## 2020-10-05 ENCOUNTER — Observation Stay (HOSPITAL_COMMUNITY)
Admission: EM | Admit: 2020-10-05 | Discharge: 2020-10-06 | Disposition: A | Discharge status: Home / Self Care | Payer: Medicare HMO | Attending: Internal Medicine | Admitting: Internal Medicine

## 2020-10-05 DIAGNOSIS — E782 Mixed hyperlipidemia: Secondary | ICD-10-CM

## 2020-10-05 DIAGNOSIS — R404 Transient alteration of awareness: Secondary | ICD-10-CM | POA: Diagnosis not present

## 2020-10-05 DIAGNOSIS — S0512XA Contusion of eyeball and orbital tissues, left eye, initial encounter: Secondary | ICD-10-CM | POA: Diagnosis not present

## 2020-10-05 DIAGNOSIS — Z20822 Contact with and (suspected) exposure to covid-19: Secondary | ICD-10-CM | POA: Insufficient documentation

## 2020-10-05 DIAGNOSIS — E1169 Type 2 diabetes mellitus with other specified complication: Secondary | ICD-10-CM | POA: Diagnosis present

## 2020-10-05 DIAGNOSIS — Z79899 Other long term (current) drug therapy: Secondary | ICD-10-CM | POA: Insufficient documentation

## 2020-10-05 DIAGNOSIS — E7800 Pure hypercholesterolemia, unspecified: Secondary | ICD-10-CM | POA: Diagnosis not present

## 2020-10-05 DIAGNOSIS — R410 Disorientation, unspecified: Secondary | ICD-10-CM | POA: Diagnosis not present

## 2020-10-05 DIAGNOSIS — R000 Tachycardia, unspecified: Secondary | ICD-10-CM | POA: Diagnosis not present

## 2020-10-05 DIAGNOSIS — S62102A Fracture of unspecified carpal bone, left wrist, initial encounter for closed fracture: Secondary | ICD-10-CM | POA: Diagnosis not present

## 2020-10-05 DIAGNOSIS — S6992XA Unspecified injury of left wrist, hand and finger(s), initial encounter: Secondary | ICD-10-CM | POA: Diagnosis present

## 2020-10-05 DIAGNOSIS — Y92012 Bathroom of single-family (private) house as the place of occurrence of the external cause: Secondary | ICD-10-CM | POA: Diagnosis not present

## 2020-10-05 DIAGNOSIS — S52509A Unspecified fracture of the lower end of unspecified radius, initial encounter for closed fracture: Secondary | ICD-10-CM

## 2020-10-05 DIAGNOSIS — R4182 Altered mental status, unspecified: Secondary | ICD-10-CM | POA: Diagnosis not present

## 2020-10-05 DIAGNOSIS — T148XXA Other injury of unspecified body region, initial encounter: Secondary | ICD-10-CM | POA: Diagnosis present

## 2020-10-05 DIAGNOSIS — I10 Essential (primary) hypertension: Secondary | ICD-10-CM | POA: Insufficient documentation

## 2020-10-05 DIAGNOSIS — R0902 Hypoxemia: Secondary | ICD-10-CM | POA: Diagnosis not present

## 2020-10-05 DIAGNOSIS — E1159 Type 2 diabetes mellitus with other circulatory complications: Secondary | ICD-10-CM | POA: Diagnosis present

## 2020-10-05 DIAGNOSIS — S52592A Other fractures of lower end of left radius, initial encounter for closed fracture: Secondary | ICD-10-CM | POA: Diagnosis not present

## 2020-10-05 DIAGNOSIS — S52502A Unspecified fracture of the lower end of left radius, initial encounter for closed fracture: Secondary | ICD-10-CM | POA: Diagnosis not present

## 2020-10-05 DIAGNOSIS — Y92009 Unspecified place in unspecified non-institutional (private) residence as the place of occurrence of the external cause: Secondary | ICD-10-CM

## 2020-10-05 DIAGNOSIS — W19XXXA Unspecified fall, initial encounter: Secondary | ICD-10-CM | POA: Insufficient documentation

## 2020-10-05 DIAGNOSIS — R42 Dizziness and giddiness: Secondary | ICD-10-CM | POA: Diagnosis not present

## 2020-10-05 DIAGNOSIS — S62102D Fracture of unspecified carpal bone, left wrist, subsequent encounter for fracture with routine healing: Secondary | ICD-10-CM | POA: Diagnosis not present

## 2020-10-05 DIAGNOSIS — Z419 Encounter for procedure for purposes other than remedying health state, unspecified: Secondary | ICD-10-CM

## 2020-10-05 DIAGNOSIS — S52552A Other extraarticular fracture of lower end of left radius, initial encounter for closed fracture: Secondary | ICD-10-CM | POA: Diagnosis not present

## 2020-10-05 DIAGNOSIS — R69 Illness, unspecified: Secondary | ICD-10-CM | POA: Diagnosis not present

## 2020-10-05 DIAGNOSIS — W19XXXD Unspecified fall, subsequent encounter: Secondary | ICD-10-CM

## 2020-10-05 DIAGNOSIS — G9341 Metabolic encephalopathy: Secondary | ICD-10-CM | POA: Diagnosis present

## 2020-10-05 DIAGNOSIS — E785 Hyperlipidemia, unspecified: Secondary | ICD-10-CM | POA: Diagnosis present

## 2020-10-05 DIAGNOSIS — Z043 Encounter for examination and observation following other accident: Secondary | ICD-10-CM | POA: Diagnosis not present

## 2020-10-05 HISTORY — PX: OPEN REDUCTION INTERNAL FIXATION (ORIF) DISTAL RADIAL FRACTURE: SHX5989

## 2020-10-05 LAB — COMPREHENSIVE METABOLIC PANEL
ALT: 29 U/L (ref 0–44)
AST: 27 U/L (ref 15–41)
Albumin: 2.4 g/dL — ABNORMAL LOW (ref 3.5–5.0)
Alkaline Phosphatase: 82 U/L (ref 38–126)
Anion gap: 12 (ref 5–15)
BUN: 6 mg/dL — ABNORMAL LOW (ref 8–23)
CO2: 26 mmol/L (ref 22–32)
Calcium: 9 mg/dL (ref 8.9–10.3)
Chloride: 96 mmol/L — ABNORMAL LOW (ref 98–111)
Creatinine, Ser: 0.48 mg/dL (ref 0.44–1.00)
GFR, Estimated: >60 mL/min (ref >60)
Glucose, Bld: 138 mg/dL — ABNORMAL HIGH (ref 70–99)
Potassium: 3.4 mmol/L — ABNORMAL LOW (ref 3.5–5.1)
Sodium: 134 mmol/L — ABNORMAL LOW (ref 135–145)
Total Bilirubin: 0.4 mg/dL (ref 0.3–1.2)
Total Protein: 7 g/dL (ref 6.5–8.1)

## 2020-10-05 LAB — CBC WITH DIFFERENTIAL/PLATELET
Abs Immature Granulocytes: 0.1 10*3/uL — ABNORMAL HIGH (ref 0.00–0.07)
Basophils Absolute: 0 10*3/uL (ref 0.0–0.1)
Basophils Relative: 0 %
Eosinophils Absolute: 0.1 10*3/uL (ref 0.0–0.5)
Eosinophils Relative: 1 %
HCT: 32.2 % — ABNORMAL LOW (ref 36.0–46.0)
Hemoglobin: 10.4 g/dL — ABNORMAL LOW (ref 12.0–15.0)
Immature Granulocytes: 1 %
Lymphocytes Relative: 7 %
Lymphs Abs: 1 10*3/uL (ref 0.7–4.0)
MCH: 28 pg (ref 26.0–34.0)
MCHC: 32.3 g/dL (ref 30.0–36.0)
MCV: 86.6 fL (ref 80.0–100.0)
Monocytes Absolute: 0.9 10*3/uL (ref 0.1–1.0)
Monocytes Relative: 7 %
Neutro Abs: 12.1 10*3/uL — ABNORMAL HIGH (ref 1.7–7.7)
Neutrophils Relative %: 84 %
Platelets: 613 10*3/uL — ABNORMAL HIGH (ref 150–400)
RBC: 3.72 MIL/uL — ABNORMAL LOW (ref 3.87–5.11)
RDW: 13.6 % (ref 11.5–15.5)
WBC: 14.2 10*3/uL — ABNORMAL HIGH (ref 4.0–10.5)
nRBC: 0 % (ref 0.0–0.2)

## 2020-10-05 LAB — VITAMIN D 25 HYDROXY (VIT D DEFICIENCY, FRACTURES): Vit D, 25-Hydroxy: 21.3 ng/mL — ABNORMAL LOW (ref 30–100)

## 2020-10-05 LAB — RESP PANEL BY RT-PCR (FLU A&B, COVID) ARPGX2
Influenza A by PCR: NEGATIVE
Influenza B by PCR: NEGATIVE
SARS Coronavirus 2 by RT PCR: NEGATIVE

## 2020-10-05 LAB — CK: Total CK: 41 U/L (ref 38–234)

## 2020-10-05 LAB — LACTIC ACID, PLASMA: Lactic Acid, Venous: 1.5 mmol/L (ref 0.5–1.9)

## 2020-10-05 LAB — SURGICAL PCR SCREEN
MRSA, PCR: NEGATIVE
Staphylococcus aureus: NEGATIVE

## 2020-10-05 SURGERY — OPEN REDUCTION INTERNAL FIXATION (ORIF) DISTAL RADIUS FRACTURE
Anesthesia: Monitor Anesthesia Care | Site: Wrist | Laterality: Left

## 2020-10-05 MED ORDER — MORPHINE SULFATE (PF) 2 MG/ML IV SOLN: 0.5000 mg | INTRAVENOUS | Status: DC | PRN

## 2020-10-05 MED ORDER — VANCOMYCIN HCL 1000 MG IV SOLR
INTRAVENOUS | Status: DC | PRN
  Administered 2020-10-05: 1000 mg

## 2020-10-05 MED ORDER — FENTANYL CITRATE (PF) 250 MCG/5ML IJ SOLN
INTRAMUSCULAR | Status: DC | PRN
  Administered 2020-10-05 (×4): 25 ug via INTRAVENOUS

## 2020-10-05 MED ORDER — FENTANYL CITRATE (PF) 100 MCG/2ML IJ SOLN
25.0000 ug | INTRAMUSCULAR | Status: DC | PRN
Start: 2020-10-05 — End: 2020-10-05

## 2020-10-05 MED ORDER — HYDROCODONE-ACETAMINOPHEN 5-325 MG PO TABS: 1.0000 | ORAL_TABLET | Freq: Four times a day (QID) | ORAL | Status: DC | PRN

## 2020-10-05 MED ORDER — CHLORHEXIDINE GLUCONATE 0.12 % MT SOLN
OROMUCOSAL | Status: AC
  Administered 2020-10-05: 15 mL
  Filled 2020-10-05: qty 15

## 2020-10-05 MED ORDER — VANCOMYCIN HCL 1000 MG IV SOLR
INTRAVENOUS | Status: AC
  Filled 2020-10-05: qty 1000

## 2020-10-05 MED ORDER — METHOCARBAMOL 1000 MG/10ML IJ SOLN
500.0000 mg | Freq: Four times a day (QID) | INTRAVENOUS | Status: DC | PRN
  Filled 2020-10-05: qty 5

## 2020-10-05 MED ORDER — DEXAMETHASONE SODIUM PHOSPHATE 10 MG/ML IJ SOLN
INTRAMUSCULAR | Status: AC
  Filled 2020-10-05: qty 1

## 2020-10-05 MED ORDER — CEFAZOLIN SODIUM-DEXTROSE 2-4 GM/100ML-% IV SOLN
2.0000 g | Freq: Three times a day (TID) | INTRAVENOUS | Status: AC
  Administered 2020-10-05 – 2020-10-06 (×3): 2 g via INTRAVENOUS
  Filled 2020-10-05 (×3): qty 100

## 2020-10-05 MED ORDER — DOCUSATE SODIUM 100 MG PO CAPS
100.0000 mg | ORAL_CAPSULE | Freq: Two times a day (BID) | ORAL | Status: DC
  Administered 2020-10-05 – 2020-10-06 (×2): 100 mg via ORAL
  Filled 2020-10-05 (×2): qty 1

## 2020-10-05 MED ORDER — CHLORHEXIDINE GLUCONATE 4 % EX LIQD
60.0000 mL | Freq: Once | CUTANEOUS | Status: DC
  Filled 2020-10-05: qty 60

## 2020-10-05 MED ORDER — ONDANSETRON HCL 4 MG/2ML IJ SOLN
INTRAMUSCULAR | Status: DC | PRN
  Administered 2020-10-05: 4 mg via INTRAVENOUS

## 2020-10-05 MED ORDER — POTASSIUM CHLORIDE CRYS ER 20 MEQ PO TBCR
20.0000 meq | EXTENDED_RELEASE_TABLET | Freq: Once | ORAL | Status: AC
  Administered 2020-10-05: 20 meq via ORAL
  Filled 2020-10-05: qty 1

## 2020-10-05 MED ORDER — POLYETHYLENE GLYCOL 3350 17 G PO PACK
17.0000 g | PACK | Freq: Every day | ORAL | Status: DC | PRN
Start: 2020-10-05 — End: 2020-10-06
  Administered 2020-10-06: 17 g via ORAL
  Filled 2020-10-05: qty 1

## 2020-10-05 MED ORDER — OXYCODONE HCL 5 MG PO TABS
5.0000 mg | ORAL_TABLET | Freq: Once | ORAL | Status: DC | PRN
Start: 2020-10-05 — End: 2020-10-05

## 2020-10-05 MED ORDER — ACETAMINOPHEN 325 MG PO TABS: 325.0000 mg | ORAL_TABLET | Freq: Four times a day (QID) | ORAL | Status: DC | PRN

## 2020-10-05 MED ORDER — FENTANYL CITRATE (PF) 250 MCG/5ML IJ SOLN
INTRAMUSCULAR | Status: AC
  Filled 2020-10-05: qty 5

## 2020-10-05 MED ORDER — ONDANSETRON HCL 4 MG/2ML IJ SOLN: 4.0000 mg | Freq: Four times a day (QID) | INTRAMUSCULAR | Status: DC | PRN

## 2020-10-05 MED ORDER — OXYCODONE HCL 5 MG/5ML PO SOLN: 5.0000 mg | Freq: Once | ORAL | Status: DC | PRN

## 2020-10-05 MED ORDER — ONDANSETRON HCL 4 MG PO TABS: 4.0000 mg | ORAL_TABLET | Freq: Four times a day (QID) | ORAL | Status: DC | PRN

## 2020-10-05 MED ORDER — MIDAZOLAM HCL 2 MG/2ML IJ SOLN
INTRAMUSCULAR | Status: AC
  Filled 2020-10-05: qty 2

## 2020-10-05 MED ORDER — SODIUM CHLORIDE 0.9 % IV SOLN: Freq: Once | INTRAVENOUS | Status: AC

## 2020-10-05 MED ORDER — BUPIVACAINE HCL (PF) 0.25 % IJ SOLN
INTRAMUSCULAR | Status: AC
  Filled 2020-10-05: qty 30

## 2020-10-05 MED ORDER — POVIDONE-IODINE 10 % EX SWAB
2.0000 "application " | Freq: Once | CUTANEOUS | Status: AC
  Administered 2020-10-05: 2 via TOPICAL

## 2020-10-05 MED ORDER — BUPIVACAINE-EPINEPHRINE (PF) 0.5% -1:200000 IJ SOLN
INTRAMUSCULAR | Status: DC | PRN
  Administered 2020-10-05: 20 mL via PERINEURAL

## 2020-10-05 MED ORDER — PROPOFOL 500 MG/50ML IV EMUL
INTRAVENOUS | Status: DC | PRN
  Administered 2020-10-05: 25 ug/kg/min via INTRAVENOUS

## 2020-10-05 MED ORDER — METHOCARBAMOL 500 MG PO TABS: 500.0000 mg | ORAL_TABLET | Freq: Four times a day (QID) | ORAL | Status: DC | PRN

## 2020-10-05 MED ORDER — ENOXAPARIN SODIUM 40 MG/0.4ML ~~LOC~~ SOLN: 40.0000 mg | SUBCUTANEOUS | Status: DC

## 2020-10-05 MED ORDER — PROPOFOL 10 MG/ML IV BOLUS
INTRAVENOUS | Status: AC
  Filled 2020-10-05: qty 40

## 2020-10-05 MED ORDER — METOCLOPRAMIDE HCL 5 MG PO TABS: 5.0000 mg | ORAL_TABLET | Freq: Three times a day (TID) | ORAL | Status: DC | PRN

## 2020-10-05 MED ORDER — FENTANYL CITRATE (PF) 100 MCG/2ML IJ SOLN
INTRAMUSCULAR | Status: AC
  Filled 2020-10-05: qty 2

## 2020-10-05 MED ORDER — ONDANSETRON HCL 4 MG/2ML IJ SOLN
INTRAMUSCULAR | Status: AC
  Filled 2020-10-05: qty 2

## 2020-10-05 MED ORDER — LACTATED RINGERS IV SOLN: INTRAVENOUS | Status: DC

## 2020-10-05 MED ORDER — HYDROCODONE-ACETAMINOPHEN 5-325 MG PO TABS
1.0000 | ORAL_TABLET | Freq: Four times a day (QID) | ORAL | Status: DC | PRN
  Administered 2020-10-06: 1 via ORAL
  Filled 2020-10-05: qty 1

## 2020-10-05 MED ORDER — DEXAMETHASONE SODIUM PHOSPHATE 10 MG/ML IJ SOLN
INTRAMUSCULAR | Status: DC | PRN
  Administered 2020-10-05: 8 mg via INTRAVENOUS

## 2020-10-05 MED ORDER — CEFAZOLIN SODIUM-DEXTROSE 2-4 GM/100ML-% IV SOLN
2.0000 g | INTRAVENOUS | Status: AC
  Administered 2020-10-05: 2 g via INTRAVENOUS
  Filled 2020-10-05 (×2): qty 100

## 2020-10-05 MED ORDER — METOCLOPRAMIDE HCL 5 MG/ML IJ SOLN: 5.0000 mg | Freq: Three times a day (TID) | INTRAMUSCULAR | Status: DC | PRN

## 2020-10-05 SURGICAL SUPPLY — 60 items
APL PRP STRL LF DISP 70% ISPRP (MISCELLANEOUS) ×1
BIT DRILL CALIBRATED 1.8MM (BIT) IMPLANT
BLADE CLIPPER SURG (BLADE) IMPLANT
BNDG CMPR 9X4 STRL LF SNTH (GAUZE/BANDAGES/DRESSINGS) ×1
BNDG ELASTIC 3X5.8 VLCR STR LF (GAUZE/BANDAGES/DRESSINGS) ×2 IMPLANT
BNDG ELASTIC 4X5.8 VLCR STR LF (GAUZE/BANDAGES/DRESSINGS) ×2 IMPLANT
BNDG ESMARK 4X9 LF (GAUZE/BANDAGES/DRESSINGS) ×2 IMPLANT
BNDG GAUZE ELAST 4 BULKY (GAUZE/BANDAGES/DRESSINGS) ×1 IMPLANT
CHLORAPREP W/TINT 26 (MISCELLANEOUS) ×1 IMPLANT
COVER SURGICAL LIGHT HANDLE (MISCELLANEOUS) ×2 IMPLANT
COVER WAND RF STERILE (DRAPES) ×2 IMPLANT
CUFF TOURN SGL QUICK 18X4 (TOURNIQUET CUFF) ×1 IMPLANT
CUFF TOURN SGL QUICK 24 (TOURNIQUET CUFF)
CUFF TRNQT CYL 24X4X16.5-23 (TOURNIQUET CUFF) IMPLANT
DECANTER SPIKE VIAL GLASS SM (MISCELLANEOUS) ×2 IMPLANT
DISTAL RADIUS VOL 2.4NAR 6/3H (Orthopedic Implant) ×2 IMPLANT
DRAPE C-ARM 42X72 X-RAY (DRAPES) ×1 IMPLANT
DRAPE U-SHAPE 47X51 STRL (DRAPES) ×2 IMPLANT
DRILL CALIBRATED 1.8MM (BIT) ×2
ELECT REM PT RETURN 9FT ADLT (ELECTROSURGICAL) ×2
ELECTRODE REM PT RTRN 9FT ADLT (ELECTROSURGICAL) IMPLANT
GAUZE SPONGE 4X4 12PLY STRL (GAUZE/BANDAGES/DRESSINGS) ×2 IMPLANT
GAUZE XEROFORM 1X8 LF (GAUZE/BANDAGES/DRESSINGS) ×2 IMPLANT
GLOVE BIO SURGEON STRL SZ 6.5 (GLOVE) ×2 IMPLANT
GLOVE BIO SURGEON STRL SZ7.5 (GLOVE) ×2 IMPLANT
GLOVE BIOGEL PI IND STRL 7.0 (GLOVE) ×1 IMPLANT
GLOVE BIOGEL PI IND STRL 7.5 (GLOVE) ×1 IMPLANT
GLOVE BIOGEL PI INDICATOR 7.0 (GLOVE) ×1
GLOVE BIOGEL PI INDICATOR 7.5 (GLOVE) ×1
GOWN STRL REUS W/ TWL LRG LVL3 (GOWN DISPOSABLE) ×1 IMPLANT
GOWN STRL REUS W/TWL LRG LVL3 (GOWN DISPOSABLE) ×6
K-WIRE TP EXFX F/2.4 1.8X150 (WIRE) ×2
KIT BASIN OR (CUSTOM PROCEDURE TRAY) ×2 IMPLANT
KIT TURNOVER KIT B (KITS) ×2 IMPLANT
KWIRE TP EXFX F/2.4 1.8X150 (WIRE) IMPLANT
MANIFOLD NEPTUNE II (INSTRUMENTS) ×2 IMPLANT
NEEDLE 22X1 1/2 (OR ONLY) (NEEDLE) IMPLANT
NS IRRIG 1000ML POUR BTL (IV SOLUTION) ×2 IMPLANT
PACK ORTHO EXTREMITY (CUSTOM PROCEDURE TRAY) ×2 IMPLANT
PAD ARMBOARD 7.5X6 YLW CONV (MISCELLANEOUS) ×4 IMPLANT
PAD CAST 4YDX4 CTTN HI CHSV (CAST SUPPLIES) ×1 IMPLANT
PADDING CAST COTTON 4X4 STRL (CAST SUPPLIES) ×2
PLATE VOL RADIUS 2.4NAR 6/3H (Orthopedic Implant) IMPLANT
SCREW CORTEX 2.4X12MM (Screw) ×3 IMPLANT
SCREW CORTEX SLFTPNG 20MM 2.4 (Screw) ×1 IMPLANT
SCREW LOCK T8 20X2.4XSTVA (Screw) IMPLANT
SCREW LOCKING 2.4X20 (Screw) ×2 IMPLANT
SCREW LOCKING 2.4X22 (Screw) ×2 IMPLANT
SCREW VA LOCKING 2.4X18 (Screw) ×1 IMPLANT
SLING ARM FOAM STRAP MED (SOFTGOODS) ×1 IMPLANT
STRIP CLOSURE SKIN 1/2X4 (GAUZE/BANDAGES/DRESSINGS) ×1 IMPLANT
SUT MNCRL AB 3-0 PS2 18 (SUTURE) ×1 IMPLANT
SUT VIC AB 2-0 CT1 27 (SUTURE) ×2
SUT VIC AB 2-0 CT1 TAPERPNT 27 (SUTURE) IMPLANT
SYR CONTROL 10ML LL (SYRINGE) ×2 IMPLANT
TOWEL GREEN STERILE (TOWEL DISPOSABLE) ×2 IMPLANT
TOWEL GREEN STERILE FF (TOWEL DISPOSABLE) ×2 IMPLANT
TUBE CONNECTING 12X1/4 (SUCTIONS) ×2 IMPLANT
WATER STERILE IRR 1000ML POUR (IV SOLUTION) ×2 IMPLANT
YANKAUER SUCT BULB TIP NO VENT (SUCTIONS) ×1 IMPLANT

## 2020-10-05 NOTE — Interval H&P Note (Signed)
History and Physical Interval Note:  10/05/2020 11:08 AM  Mckenzie [name]  has presented today for surgery, with the diagnosis of Left Distal Radius Fracture.  The various methods of treatment have been discussed with the patient and family. After consideration of risks, benefits and other options for treatment, the patient has consented to  Procedure(s): OPEN REDUCTION INTERNAL FIXATION (ORIF) DISTAL RADIAL FRACTURE (Left) as a surgical intervention.  The patient's history has been reviewed, patient examined, no change in status, stable for surgery.  I have reviewed the patient's chart and labs.  Questions were answered to the patient's satisfaction.     Caryn Bee P Jacksyn Beeks

## 2020-10-05 NOTE — ED Triage Notes (Signed)
Pt transported from home after unwitnessed fall from home, family heard pt fall in bathroom. Lac to L eye, bleeding controlled, deformity to L wrist. Pt continues to c/o dizziness.

## 2020-10-05 NOTE — Anesthesia Procedure Notes (Signed)
Procedure Name: MAC Date/Time: 10/05/2020 11:59 AM Performed by: Kathryne Hitch, CRNA Pre-anesthesia Checklist: Patient identified, Emergency Drugs available, Suction available and Patient being monitored Patient Re-evaluated:Patient Re-evaluated prior to induction Oxygen Delivery Method: Simple face mask Preoxygenation: Pre-oxygenation with 100% oxygen Induction Type: IV induction Placement Confirmation: positive ETCO2 Dental Injury: Teeth and Oropharynx as per pre-operative assessment

## 2020-10-05 NOTE — H&P (View-Only) (Signed)
Reason for Consult:Left wrist fx °Referring Physician: S Goldston °Time called: 0851 °Time at bedside: 0928 ° ° °Mckenzie [name] is an 74 y.o. female.  °HPI: Karn got up to go to the bathroom this morning and fell. The fall was unwitnessed and she was unable to articulate what caused the fall. She was brought to the ED where x-rays showed a left wrist fx and orthopedic surgery was consulted. Husband notes she's had some mental decline over the last 3-4 weeks compounded by Valium yesterday for a lumbar MRI. She has seen neurology but doesn't have results of any tests yet. She is RHD. ° °Past Medical History:  °Diagnosis Date  °• Depression   °• High cholesterol   °• Hypertension   °• Retinal micro-aneurysm of right eye   ° ° °Past Surgical History:  °Procedure Laterality Date  °• EYE SURGERY    °• FINGER SURGERY Left   ° RING FINGER  °• TUBAL LIGATION    ° ° °Family History  °Problem Relation Age of Onset  °• Hyperlipidemia Mother   °• Hypertension Mother   °• Diabetes Mother   °• COPD Father   ° ° °Social History:  reports that she has never smoked. She has never used smokeless tobacco. She reports that she does not drink alcohol and does not use drugs. ° °Allergies:  °Allergies  °Allergen Reactions  °• Aldomet [Methyldopa] Other (See Comments)  °  flu  °• Fosamax [Alendronate Sodium]   °• Acetazolamide Rash  °• Buspar [Buspirone] Palpitations  ° ° °Medications: I have reviewed the patient's current medications. ° °Results for orders placed or performed during the hospital encounter of 10/05/20 (from the past 48 hour(s))  °Lactic acid, plasma     Status: None  ° Collection Time: 10/05/20  6:22 AM  °Result Value Ref Range  ° Lactic Acid, Venous 1.5 0.5 - 1.9 mmol/L  °  Comment: Performed at [facility], [address] [place], [place] [address]  °Comprehensive metabolic panel     Status: Abnormal  ° Collection Time: 10/05/20  6:22 AM  °Result Value Ref Range  ° Sodium 134 (L) 135 - 145 mmol/L  °  Potassium 3.4 (L) 3.5 - 5.1 mmol/L  ° Chloride 96 (L) 98 - 111 mmol/L  ° CO2 26 22 - 32 mmol/L  ° Glucose, Bld 138 (H) 70 - 99 mg/dL  °  Comment: Glucose reference range applies only to samples taken after fasting for at least 8 hours.  ° BUN 6 (L) 8 - 23 mg/dL  ° Creatinine, Ser 0.48 0.44 - 1.00 mg/dL  ° Calcium 9.0 8.9 - 10.3 mg/dL  ° Total Protein 7.0 6.5 - 8.1 g/dL  ° Albumin 2.4 (L) 3.5 - 5.0 g/dL  ° AST 27 15 - 41 U/L  ° ALT 29 0 - 44 U/L  ° Alkaline Phosphatase 82 38 - 126 U/L  ° Total Bilirubin 0.4 0.3 - 1.2 mg/dL  ° GFR, Estimated >60 >60 mL/min  °  Comment: (NOTE) °Calculated using the CKD-EPI Creatinine Equation (2021) °  ° Anion gap 12 5 - 15  °  Comment: Performed at  [facility]ospital [facility], [address] [place], [place] [address]  °CBC with Differential     Status: Abnormal  ° Collection Time: 10/05/20  6:22 AM  °Result Value Ref Range  ° WBC 14.2 (H) 4.0 - 10.5 K/uL  ° RBC 3.72 (L) 3.87 - 5.11 MIL/uL  ° Hemoglobin 10.4 (L) 12.0 -   15.0 g/dL  ° HCT 32.2 (L) 36.0 - 46.0 %  ° MCV 86.6 80.0 - 100.0 fL  ° MCH 28.0 26.0 - 34.0 pg  ° MCHC 32.3 30.0 - 36.0 g/dL  ° RDW 13.6 11.5 - 15.5 %  ° Platelets 613 (H) 150 - 400 K/uL  ° nRBC 0.0 0.0 - 0.2 %  ° Neutrophils Relative % 84 %  ° Neutro Abs 12.1 (H) 1.7 - 7.7 K/uL  ° Lymphocytes Relative 7 %  ° Lymphs Abs 1.0 0.7 - 4.0 K/uL  ° Monocytes Relative 7 %  ° Monocytes Absolute 0.9 0.1 - 1.0 K/uL  ° Eosinophils Relative 1 %  ° Eosinophils Absolute 0.1 0.0 - 0.5 K/uL  ° Basophils Relative 0 %  ° Basophils Absolute 0.0 0.0 - 0.1 K/uL  ° Immature Granulocytes 1 %  ° Abs Immature Granulocytes 0.10 (H) 0.00 - 0.07 K/uL  °  Comment: Performed at [facility], [address] [place], [place] [address]  °CK     Status: None  ° Collection Time: 10/05/20  6:22 AM  °Result Value Ref Range  ° Total CK 41 38 - 234 U/L  °  Comment: Performed at [facility], [address] [place], [place] [address]  °Resp Panel by RT-PCR (Flu A&B, Covid) Nasopharyngeal Swab     Status:  None  ° Collection Time: 10/05/20  6:43 AM  ° Specimen: Nasopharyngeal Swab; Nasopharyngeal(NP) swabs in vial transport medium  °Result Value Ref Range  ° SARS Coronavirus 2 by RT PCR NEGATIVE NEGATIVE  °  Comment: (NOTE) °SARS-CoV-2 target nucleic acids are NOT DETECTED. ° °The SARS-CoV-2 RNA is generally detectable in upper respiratory °specimens during the acute phase of infection. The lowest °concentration of SARS-CoV-2 viral copies this assay can detect is °138 copies/mL. A negative result does not preclude SARS-Cov-2 °infection and should not be used as the sole basis for treatment or °other patient management decisions. A negative result may occur with  °improper specimen collection/handling, submission of specimen other °than nasopharyngeal swab, presence of viral mutation(s) within the °areas targeted by this assay, and inadequate number of viral °copies(<138 copies/mL). A negative result must be combined with °clinical observations, patient history, and epidemiological °information. The expected result is Negative. ° °Fact Sheet for Patients:  °https://www.fda.gov/media/152166/download ° °Fact Sheet for Healthcare Providers:  °https://www.fda.gov/media/152162/download ° °This test is no t yet approved or cleared by the [place] FDA and  °has been authorized for detection and/or diagnosis of SARS-CoV-2 by °FDA under an Emergency Use Authorization (EUA). This EUA will remain  °in effect (meaning this test can be used) for the duration of the °COVID-19 declaration under Section 564(b)(1) of the Act, 21 °U.S.C.section 360bbb-3(b)(1), unless the authorization is terminated  °or revoked sooner.  ° ° °  ° Influenza A by PCR NEGATIVE NEGATIVE  ° Influenza B by PCR NEGATIVE NEGATIVE  °  Comment: (NOTE) °The Xpert Xpress SARS-CoV-2/FLU/RSV plus assay is intended as an aid °in the diagnosis of influenza from Nasopharyngeal swab specimens and °should not be used as a sole basis for treatment. Nasal washings  and °aspirates are unacceptable for Xpert Xpress SARS-CoV-2/FLU/RSV °testing. ° °Fact Sheet for Patients: °https://www.fda.gov/media/152166/download ° °Fact Sheet for Healthcare Providers: °https://www.fda.gov/media/152162/download ° °This test is not yet approved or cleared by the [place] FDA and °has been authorized for detection and/or diagnosis of SARS-CoV-2 by °FDA under an Emergency Use Authorization (EUA). This EUA will remain °in effect (meaning this test can   be used) for the duration of the °COVID-19 declaration under Section 564(b)(1) of the Act, 21 U.S.C. °section 360bbb-3(b)(1), unless the authorization is terminated or °revoked. ° °Performed at [facility], [address] [place], [place] °[address] °  ° ° °DG Wrist Complete Left ° °Result Date: 10/05/2020 °CLINICAL DATA:  Fall, left wrist pain EXAM: LEFT WRIST - COMPLETE 3+ VIEW COMPARISON:  None. FINDINGS: Three view radiograph left wrist demonstrates an acute, mildly comminuted transverse fracture of the distal left radial metaphysis extending into the distal radioulnar joint. The distal fracture fragment demonstrates 1/2 shaft with dorsal displacement, approximately 40 degrees dorsal angulation, and impaction with resultant mild ulnar positive variance. The transverse fracture plane extends into the distal radioulnar joint. A longitudinal component of the fracture extends into the lunate fossa of the distal radial articular surface. Radiocarpal articulation appears preserved. Radial inclination appears preserved. And associated mildly displaced fracture of the base of the ulnar styloid is identified. There is extensive surrounding soft tissue swelling. Advanced degenerative changes are noted at the base of the thumb at the first carpometacarpal joint. IMPRESSION: Comminuted, impacted, markedly angulated fracture of the distal left radius with intra-articular extension of the fracture plane. Mildly displaced fracture of the base of the  ulnar styloid. Electronically Signed   By: Ashesh  Parikh MD   On: 10/05/2020 06:40  ° °DG Chest Portable 1 View ° °Result Date: 10/05/2020 °CLINICAL DATA:  Fall EXAM: PORTABLE CHEST 1 VIEW COMPARISON:  08/21/2018 FINDINGS: Lungs are well expanded, symmetric, and clear. No pneumothorax or pleural effusion. Cardiac size within normal limits. Pulmonary vascularity is normal. Osseous structures are age-appropriate. No acute bone abnormality. IMPRESSION: No active disease. Electronically Signed   By: Ashesh  Parikh MD   On: 10/05/2020 06:37  ° ° °Review of Systems  °HENT: Negative for ear discharge, ear pain, hearing loss and tinnitus.   °Eyes: Negative for photophobia and pain.  °Respiratory: Negative for cough and shortness of breath.   °Cardiovascular: Negative for chest pain.  °Gastrointestinal: Negative for abdominal pain, nausea and vomiting.  °Genitourinary: Negative for dysuria, flank pain, frequency and urgency.  °Musculoskeletal: Positive for arthralgias (Left wrist) and back pain. Negative for myalgias and neck pain.  °Neurological: Negative for dizziness and headaches.  °Hematological: Does not bruise/bleed easily.  °Psychiatric/Behavioral: Positive for confusion. The patient is not nervous/anxious.   ° °Blood pressure (!) 167/79, pulse (!) 102, temperature 98.8 °F (37.1 °C), temperature source Oral, resp. rate (!) 23, height 5' 4" (1.626 m), weight 46 kg, SpO2 99 %. °Physical Exam °Constitutional:   °   General: She is not in acute distress. °   Appearance: She is well-developed and well-nourished. She is not diaphoretic.  °HENT:  °   Head: Normocephalic and atraumatic.  °Eyes:  °   General: No scleral icterus.    °   Right eye: No discharge.     °   Left eye: No discharge.  °   Conjunctiva/sclera: Conjunctivae normal.  °Cardiovascular:  °   Rate and Rhythm: Normal rate and regular rhythm.  °Pulmonary:  °   Effort: Pulmonary effort is normal. No respiratory distress.  °Musculoskeletal:  °   Cervical back:  Normal range of motion.  °   Comments: Left shoulder, elbow, wrist, digits- no skin wounds, mod TTP, no instability, no blocks to motion ° Sens  Ax/R/M/U intact ° Mot   Ax/ R/ PIN/ M/ AIN/ U intact ° Rad 2+  °Skin: °   General: Skin   is warm and dry.  °Psychiatric:     °   Mood and Affect: Mood and affect normal.     °   Speech: Speech is delayed.     °   Behavior: Behavior normal.  ° ° ° °Assessment/Plan: °Left wrist fx -- Plan ORIF today by Dr. Haddix. Please keep NPO. °AMS -- Suspect will need medical admit for workup as the fall could have been 2/2 the same process. °Multiple medical problems including depression, HTN, and HLD -- per primary service ° ° ° °Talah Cookston J. Eiza Canniff, PA-C °Orthopedic Surgery °[phone] °10/05/2020, 9:56 AM  °

## 2020-10-05 NOTE — ED Notes (Signed)
Patient brought to room and placed on monitors. Assisted into gown and left arm which was on EMS stabilization device and was reassessed, pulses and perfusion intact. Stabilization device reapplied and patient with motor intact in fingers. Provider at bedside to assess. She is alert and oriented x4, noted drowsy state, follows commands. Recent valium for MRI.

## 2020-10-05 NOTE — H&P (Signed)
History and Physical    Mckenzie [name] KDX:[id number] DOB: 1946/07/31 DOA: 10/05/2020  Referring MD/NP/PA: Alecia Lemming, PA-C PCP: Loman Brooklyn, FNP  Consultants: Dr. Gladstone Lighter orthopedics Dr. Felecia Shelling neurology Patient coming from: Home via EMS  Chief Complaint: Follow-up  I have personally briefly reviewed patient's old medical records in [place]   HPI: Mckenzie [name] is a 74 y.o. female with medical history significant of hypertension, hyperlipidemia, and depression presents after fall at home.  History is obtained from the patient's husband present at bedside.  He reports that over the last month the patient had been more confused that he describes as her being disoriented at times and staring off.  Normally she had been alert and oriented x3.  She had also been also having complaints of pain all over.  Symptoms initially started in her neck and then went to her back.  Patient was needing to use her hands to sit up from chair she has been being followed for the symptoms by her primary care provider, Dr. Gladstone Lighter of orthopedics, and Dr. Felecia Shelling of neurolgy.  Previously prescribed prednisone without any improvement in pain complaints.  Previous work-up had included ESR, CRP, ANA, uric acid, SPEP/IEF and UPEP which were reported to be normal.    She  seen Dr. Felecia Shelling on 12/21 and they had checked CK, acetylcholine receptor binding, and TSH which were also within normal limits. There been concerned for the possibility of a pinched nerve and had ordered an MRI of her lumbar spine which was done yesterday.  Prior to the MRI patient had taken to Valium.  This morning at around 4 AM the patient got in bed and fell injuring her left hand.  Patient had not had any other recent medication changes to his knowledge.  ED Course: Upon admission to the emergency department patient was seen to have temperature of 100.2 F, pulse 96 102, respiration 18-23, and all other vital signs maintained.  Labs  significant for WBC 14.2, hemoglobin 10.4, platelets 613, sodium 134, potassium 3.4, albumin 2.4, and CK 41.  X-ray significant for a communicated impacted and markedly angulated fracture at the distal left radius.  COVID-19 screening was negative.  CT imaging did not note any acute fracture facial fractures or intracranial abnormality.  Review of Systems  Unable to perform ROS: Mental status change  Musculoskeletal: Positive for falls, joint pain and myalgias.  Neurological: Positive for weakness.     Past Medical History:  Diagnosis Date  . Depression   . High cholesterol   . Hypertension   . Retinal micro-aneurysm of right eye     Past Surgical History:  Procedure Laterality Date  . EYE SURGERY    . FINGER SURGERY Left    RING FINGER  . TUBAL LIGATION       reports that she has never smoked. She has never used smokeless tobacco. She reports that she does not drink alcohol and does not use drugs.  Allergies  Allergen Reactions  . Aldomet [Methyldopa] Other (See Comments)    flu  . Fosamax [Alendronate Sodium]   . Acetazolamide Rash  . Buspar [Buspirone] Palpitations    Family History  Problem Relation Age of Onset  . Hyperlipidemia Mother   . Hypertension Mother   . Diabetes Mother   . COPD Father     Prior to Admission medications   Medication Sig Start Date End Date Taking? Authorizing Provider  amLODipine (NORVASC) 2.5 MG tablet Take 1 tablet (2.5 mg  total) by mouth 2 (two) times daily. (Needs to see new provider) 01/04/20 04/03/20  Loman Brooklyn, FNP  COMBIGAN 0.2-0.5 % ophthalmic solution Place 1 drop into both eyes every 12 (twelve) hours. 12/01/13   [provider]  dorzolamide (TRUSOPT) 2 % ophthalmic solution Place 1 drop into both eyes 2 (two) times daily.    [provider]  fluorometholone (FML) 0.1 % ophthalmic suspension Place 1 drop into the right eye 4 (four) times daily.    [provider]  hydroxypropyl methylcellulose /  hypromellose (ISOPTO TEARS / GONIOVISC) 2.5 % ophthalmic solution Place 1 drop into both eyes.    [provider]  latanoprost (XALATAN) 0.005 % ophthalmic solution  12/29/15   [provider]  lisinopril (ZESTRIL) 10 MG tablet Take one tab in the AM and 2 tabs in the PM 01/04/20   Loman Brooklyn, FNP  lisinopril (ZESTRIL) 20 MG tablet Take 1 tablet (20 mg total) by mouth daily. 09/11/20   Loman Brooklyn, FNP  Netarsudil Dimesylate (RHOPRESSA) 0.02 % SOLN Apply 1 drop to eye.    [provider]  PROLENSA 0.07 % SOLN Place 1 drop into the right eye daily. 12/14/13   [provider]    Physical Exam:  Constitutional: Female who appears to be in no acute Vitals:   10/05/20 0602 10/05/20 0605 10/05/20 0755 10/05/20 0830  BP: (!) 155/62  139/69 (!) 167/79  Pulse: 98  96 (!) 102  Resp: 20  18 (!) 23  Temp: 100.2 F (37.9 C)  98.8 F (37.1 C)   TempSrc: Oral  Oral   SpO2: 95%  96% 99%  Weight:  46 kg    Height: 5' 4.5" (1.638 m) _0  (1.626 m)     Eyes: PERRL, lids and conjunctivae normal ENMT: Mucous membranes are moist. Posterior pharynx clear of any exudate or lesions.  Neck: normal, supple, no masses, no thyromegaly Respiratory: clear to auscultation bilaterally, no wheezing, no crackles. Normal respiratory effort. No accessory muscle use.  Cardiovascular: Regular rate and rhythm, no murmurs / rubs / gallops. No extremity edema. 2+ pedal pulses. No carotid bruits.  Abdomen: no tenderness, no masses palpated. No hepatosplenomegaly. Bowel sounds positive.  Musculoskeletal: Deformity appreciated of the left wrist Skin: Bruising present of the left wrist Neurologic: CN 2-12 grossly intact. Sensation intact, DTR normal. Strength 5/5 in all 4.  Psychiatric: Alert and oriented x 3. Normal mood.     Labs on Admission: I have personally reviewed following labs and imaging studies  CBC: Recent Labs  Lab 10/05/20 0622  WBC 14.2*  NEUTROABS 12.1*   HGB 10.4*  HCT 32.2*  MCV 86.6  PLT 093*   Basic Metabolic Panel: Recent Labs  Lab 10/05/20 0622  NA 134*  K 3.4*  CL 96*  CO2 26  GLUCOSE 138*  BUN 6*  CREATININE 0.48  CALCIUM 9.0   GFR: Estimated Creatinine Clearance: 44.1 mL/min (by C-G formula based on SCr of 0.48 mg/dL). Liver Function Tests: Recent Labs  Lab 10/05/20 0622  AST 27  ALT 29  ALKPHOS 82  BILITOT 0.4  PROT 7.0  ALBUMIN 2.4*   No results for input(s): LIPASE, AMYLASE in the last 168 hours. No results for input(s): AMMONIA in the last 168 hours. Coagulation Profile: No results for input(s): INR, PROTIME in the last 168 hours. Cardiac Enzymes: Recent Labs  Lab 10/05/20 0622  CKTOTAL 41   BNP (last 3 results) No results for input(s): PROBNP in  the last 8760 hours. HbA1C: No results for input(s): HGBA1C in the last 72 hours. CBG: No results for input(s): GLUCAP in the last 168 hours. Lipid Profile: No results for input(s): CHOL, HDL, LDLCALC, TRIG, CHOLHDL, LDLDIRECT in the last 72 hours. Thyroid Function Tests: No results for input(s): TSH, T4TOTAL, FREET4, T3FREE, THYROIDAB in the last 72 hours. Anemia Panel: No results for input(s): VITAMINB12, FOLATE, FERRITIN, TIBC, IRON, RETICCTPCT in the last 72 hours. Urine analysis:    Component Value Date/Time   BILIRUBINUR neg 05/13/2015 0827   PROTEINUR neg 05/13/2015 0827   UROBILINOGEN negative 05/13/2015 0827   NITRITE neg 05/13/2015 0827   LEUKOCYTESUR Negative 05/13/2015 0827   Sepsis Labs: Recent Results (from the past 240 hour(s))  Resp Panel by RT-PCR (Flu A&B, Covid) Nasopharyngeal Swab     Status: None   Collection Time: 10/05/20  6:43 AM   Specimen: Nasopharyngeal Swab; Nasopharyngeal(NP) swabs in vial transport medium  Result Value Ref Range Status   SARS Coronavirus 2 by RT PCR NEGATIVE NEGATIVE Final    Comment: (NOTE) SARS-CoV-2 target nucleic acids are NOT DETECTED.  The SARS-CoV-2 RNA is generally detectable in upper  respiratory specimens during the acute phase of infection. The lowest concentration of SARS-CoV-2 viral copies this assay can detect is 138 copies/mL. A negative result does not preclude SARS-Cov-2 infection and should not be used as the sole basis for treatment or other patient management decisions. A negative result may occur with  improper specimen collection/handling, submission of specimen other than nasopharyngeal swab, presence of viral mutation(s) within the areas targeted by this assay, and inadequate number of viral copies(<138 copies/mL). A negative result must be combined with clinical observations, patient history, and epidemiological information. The expected result is Negative.  Fact Sheet for Patients:  EntrepreneurPulse.com.au  Fact Sheet for Healthcare Providers:  IncredibleEmployment.be  This test is no t yet approved or cleared by the [place] FDA and  has been authorized for detection and/or diagnosis of SARS-CoV-2 by FDA under an Emergency Use Authorization (EUA). This EUA will remain  in effect (meaning this test can be used) for the duration of the COVID-19 declaration under Section 564(b)(1) of the Act, 21 U.S.C.section 360bbb-3(b)(1), unless the authorization is terminated  or revoked sooner.       Influenza A by PCR NEGATIVE NEGATIVE Final   Influenza B by PCR NEGATIVE NEGATIVE Final    Comment: (NOTE) The Xpert Xpress SARS-CoV-2/FLU/RSV plus assay is intended as an aid in the diagnosis of influenza from Nasopharyngeal swab specimens and should not be used as a sole basis for treatment. Nasal washings and aspirates are unacceptable for Xpert Xpress SARS-CoV-2/FLU/RSV testing.  Fact Sheet for Patients: EntrepreneurPulse.com.au  Fact Sheet for Healthcare Providers: IncredibleEmployment.be  This test is not yet approved or cleared by the [place] FDA and has been  authorized for detection and/or diagnosis of SARS-CoV-2 by FDA under an Emergency Use Authorization (EUA). This EUA will remain in effect (meaning this test can be used) for the duration of the COVID-19 declaration under Section 564(b)(1) of the Act, 21 U.S.C. section 360bbb-3(b)(1), unless the authorization is terminated or revoked.  Performed at [facility], [place] [address] [place], [place] [address]      Radiological Exams on Admission: DG Wrist Complete Left  Result Date: 10/05/2020 CLINICAL DATA:  Fall, left wrist pain EXAM: LEFT WRIST - COMPLETE 3+ VIEW COMPARISON:  None. FINDINGS: Three view radiograph left wrist demonstrates an acute, mildly comminuted transverse fracture of  the distal left radial metaphysis extending into the distal radioulnar joint. The distal fracture fragment demonstrates 1/2 shaft with dorsal displacement, approximately 40 degrees dorsal angulation, and impaction with resultant mild ulnar positive variance. The transverse fracture plane extends into the distal radioulnar joint. A longitudinal component of the fracture extends into the lunate fossa of the distal radial articular surface. Radiocarpal articulation appears preserved. Radial inclination appears preserved. And associated mildly displaced fracture of the base of the ulnar styloid is identified. There is extensive surrounding soft tissue swelling. Advanced degenerative changes are noted at the base of the thumb at the first carpometacarpal joint. IMPRESSION: Comminuted, impacted, markedly angulated fracture of the distal left radius with intra-articular extension of the fracture plane. Mildly displaced fracture of the base of the ulnar styloid. Electronically Signed   By: Fidela Salisbury MD   On: 10/05/2020 06:40   DG Chest Portable 1 View  Result Date: 10/05/2020 CLINICAL DATA:  Fall EXAM: PORTABLE CHEST 1 VIEW COMPARISON:  08/21/2018 FINDINGS: Lungs are well expanded, symmetric, and clear. No  pneumothorax or pleural effusion. Cardiac size within normal limits. Pulmonary vascularity is normal. Osseous structures are age-appropriate. No acute bone abnormality. IMPRESSION: No active disease. Electronically Signed   By: Fidela Salisbury MD   On: 10/05/2020 06:37    EKG: Independently reviewed.  Sinus tachycardia 101 bpm  Assessment/Plan Communicated left distal radius fracture secondary to fall: Acute.  Patient had an unwitnessed fall this morning suffering communicated fracture of the left distal radius.  Patient had been given Valium yesterday which may have contributed to the fall. -Admit to a medical telemetry -N.p.o. and advance diet to heart healthy once out of surgery -Low-dose pain medications as needed -PT/OT to evaluate -Appreciate orthopedic consultative services, we will follow-up for further recommendation  Acute metabolic encephalopathy: Patient reportedly has been more confused and altered at times over the last 1-3 months.  CT imaging of the brain was negative for any acute abnormalities.  TSH was just recently checked and noted to be 2.1.  Patient's acute symptoms may be more so related with recently receiving Valium. -Neurochecks -Consider need to check MRI of the brain  Proximal muscle weakness: Patient currently under the care of Dr. Felecia Shelling and Gladstone Lighter being worked up in the outpatient setting.  Work-up including ESR, CRP, ANA, uric acid, SPEP/IEF, UPEP, CK, acetylcholine receptor binding, and TSH have been within normal limits. -Continue outpatient follow-up with neurology and orthopedics  Essential hypertension: Home blood pressure medications appear to include lisinopril amlodipine. -Check orthostatic vital signs tomorrow morning -Restart home medications when they have been able to be verified  Hypokalemia: Acute.  Potassium just mildly low at 3.4.  Patient was given 20 mEq of potassium chloride. -Continue to monitor and replace as needed  Right foraminal  impingement of C5-C6: Incidentally seen on CT imaging of the cervical spine suspected secondary to spurring.  Unclear if this could be causing any of patient's symptoms  DVT prophylaxis: Lovenox Code Status: Full Family Communication: Patient's husband updated at bedside Disposition Plan: To be determined Consults called: Orthopedics Admission status: Inpatient require more than 2 midnight stay   Norval Morton MD [facility]   If 7PM-7AM, please contact night-coverage   10/05/2020, 10:14 AM

## 2020-10-05 NOTE — ED Provider Notes (Addendum)
[place] EMERGENCY DEPARTMENT Provider Note   CSN: [id number] Arrival date & time: 10/05/20  0559     History Chief Complaint  Patient presents with  . Fall    Mckenzie [name] is a 74 y.o. female.  Patient presents to the emergency department today for evaluation of injury sustained during a fall approximately 4 AM today.  Patient has been undergoing evaluation and treatment of proximal muscle weakness by Dr. Gladstone Lighter and Dr. Felecia Shelling without definitive diagnosis as of yet.  Patient's husband states that the patient has been very weak for several months with soft garbled speech.  Patient took Valium yesterday in preparation for an MRI of her lumbar spine in the afternoon.  Patient was very lethargic last night after taking this medication.  In the middle the night she got up and had a fall in the dark.  She had pain and deformity of her left wrist and developed bruising around her eyes.  Patient currently complains of wrist pain.  Her mental status is close to baseline however she is more "out of it" than usual per the patient's husband.  No vomiting, neck pain.  No treatments prior to arrival.  Sam splint placed on EMS arrival.        Past Medical History:  Diagnosis Date  . Depression   . High cholesterol   . Hypertension   . Retinal micro-aneurysm of right eye     Patient Active Problem List   Diagnosis Date Noted  . Proximal weakness of extremity 09/26/2020  . Myalgia 09/26/2020  . Fall 09/26/2020  . 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 Hypertension 01/11/2014  . Vertigo 01/11/2014  . Hyperlipidemia 01/11/2014    Past Surgical History:  Procedure Laterality Date  . EYE SURGERY    . FINGER SURGERY Left    RING FINGER  . TUBAL LIGATION       OB History   No obstetric history on file.     Family History  Problem Relation Age of Onset  . Hyperlipidemia Mother   . Hypertension Mother   . Diabetes Mother   . COPD Father     Social History    Tobacco Use  . Smoking status: Never Smoker  . Smokeless tobacco: Never Used  Vaping Use  . Vaping Use: Never used  Substance Use Topics  . Alcohol use: No  . Drug use: No    Home Medications Prior to Admission medications   Medication Sig Start Date End Date Taking? Authorizing Provider  amLODipine (NORVASC) 2.5 MG tablet Take 1 tablet (2.5 mg total) by mouth 2 (two) times daily. (Needs to see new provider) 01/04/20 04/03/20  Loman Brooklyn, FNP  COMBIGAN 0.2-0.5 % ophthalmic solution Place 1 drop into both eyes every 12 (twelve) hours. 12/01/13   [provider]  dorzolamide (TRUSOPT) 2 % ophthalmic solution Place 1 drop into both eyes 2 (two) times daily.    [provider]  fluorometholone (FML) 0.1 % ophthalmic suspension Place 1 drop into the right eye 4 (four) times daily.    [provider]  hydroxypropyl methylcellulose / hypromellose (ISOPTO TEARS / GONIOVISC) 2.5 % ophthalmic solution Place 1 drop into both eyes.    [provider]  latanoprost (XALATAN) 0.005 % ophthalmic solution  12/29/15   [provider]  lisinopril (ZESTRIL) 10 MG tablet Take one tab in the AM and 2 tabs in the PM 01/04/20   Loman Brooklyn, FNP  lisinopril (ZESTRIL) 20 MG  tablet Take 1 tablet (20 mg total) by mouth daily. 09/11/20   Gwenlyn Fudge, FNP  Netarsudil Dimesylate (RHOPRESSA) 0.02 % SOLN Apply 1 drop to eye.    [provider]  PROLENSA 0.07 % SOLN Place 1 drop into the right eye daily. 12/14/13   [provider]    Allergies    Aldomet [methyldopa], Fosamax [alendronate sodium], Acetazolamide, and Buspar [buspirone]  Review of Systems   Review of Systems  Constitutional: Negative for fatigue.  HENT: Negative for tinnitus.   Eyes: Negative for photophobia, pain and visual disturbance.  Respiratory: Negative for shortness of breath.   Cardiovascular: Negative for chest pain.  Gastrointestinal: Negative for nausea and  vomiting.  Musculoskeletal: Positive for arthralgias and joint swelling. Negative for back pain, gait problem and neck pain.  Skin: Positive for color change. Negative for wound.  Neurological: Positive for dizziness and weakness (subacute). Negative for light-headedness, numbness and headaches.  Psychiatric/Behavioral: Negative for confusion and decreased concentration.    Physical Exam Updated Vital Signs BP (!) 167/79   Pulse (!) 102   Temp 98.8 F (37.1 C) (Oral)   Resp (!) 23   Ht 5\' 4"  (1.626 m)   Wt 46 kg   SpO2 99%   BMI 17.41 kg/m   Physical Exam Vitals and nursing note reviewed.  Constitutional:      Appearance: She is well-developed and well-nourished.  HENT:     Head: Normocephalic. No raccoon eyes or Battle's sign.     Comments: Mild bruising noted inferior to eyes. No point tenderness of the face.     Right Ear: Tympanic membrane, ear canal and external ear normal. No hemotympanum.     Left Ear: Tympanic membrane, ear canal and external ear normal. No hemotympanum.     Nose: Nose normal. No nasal septal hematoma.     Mouth/Throat:     Mouth: Oropharynx is clear and moist and mucous membranes are normal.     Pharynx: Uvula midline.  Eyes:     General: Lids are normal.     Extraocular Movements: EOM normal.     Right eye: No nystagmus.     Left eye: No nystagmus.     Conjunctiva/sclera: Conjunctivae normal.     Pupils: Pupils are equal, round, and reactive to light.     Comments: No visible hyphema noted  Cardiovascular:     Rate and Rhythm: Normal rate and regular rhythm.  Pulmonary:     Effort: Pulmonary effort is normal.     Breath sounds: Normal breath sounds.  Abdominal:     Palpations: Abdomen is soft.     Tenderness: There is no abdominal tenderness. There is no guarding or rebound.  Musculoskeletal:     Left elbow: Normal range of motion. No tenderness.     Right wrist: No tenderness. Normal range of motion.     Left wrist: Swelling, deformity  and tenderness present. Decreased range of motion.     Left hand: No tenderness. Normal range of motion.     Cervical back: Normal range of motion and neck supple. No tenderness or bony tenderness. Normal range of motion.     Thoracic back: No tenderness or bony tenderness.     Lumbar back: No tenderness or bony tenderness.  Skin:    General: Skin is warm and dry.  Neurological:     Mental Status: She is alert and oriented to person, place, and time.     GCS: GCS eye subscore  is 4. GCS verbal subscore is 5. GCS motor subscore is 6.     Cranial Nerves: No cranial nerve deficit.     Sensory: No sensory deficit.     Coordination: Coordination normal.     Deep Tendon Reflexes: Strength normal and reflexes are normal and symmetric.  Psychiatric:        Mood and Affect: Mood and affect normal.     ED Results / Procedures / Treatments   Labs (all labs ordered are listed, but only abnormal results are displayed) Labs Reviewed  COMPREHENSIVE METABOLIC PANEL - Abnormal; Notable for the following components:      Result Value   Sodium 134 (*)    Potassium 3.4 (*)    Chloride 96 (*)    Glucose, Bld 138 (*)    BUN 6 (*)    Albumin 2.4 (*)    All other components within normal limits  CBC WITH DIFFERENTIAL/PLATELET - Abnormal; Notable for the following components:   WBC 14.2 (*)    RBC 3.72 (*)    Hemoglobin 10.4 (*)    HCT 32.2 (*)    Platelets 613 (*)    Neutro Abs 12.1 (*)    Abs Immature Granulocytes 0.10 (*)    All other components within normal limits  RESP PANEL BY RT-PCR (FLU A&B, COVID) ARPGX2  LACTIC ACID, PLASMA  CK  URINALYSIS, ROUTINE W REFLEX MICROSCOPIC  RAPID URINE DRUG SCREEN, HOSP PERFORMED    ED ECG REPORT   Date: 10/05/2020  Rate: 101  Rhythm: sinus tachycardia  QRS Axis: normal  Intervals: normal  ST/T Wave abnormalities: nonspecific ST changes  Conduction Disutrbances:none  Narrative Interpretation:   Old EKG Reviewed: unchanged  I have personally  reviewed the EKG tracing and agree with the computerized printout as noted.  Radiology DG Wrist Complete Left  Result Date: 10/05/2020 CLINICAL DATA:  Fall, left wrist pain EXAM: LEFT WRIST - COMPLETE 3+ VIEW COMPARISON:  None. FINDINGS: Three view radiograph left wrist demonstrates an acute, mildly comminuted transverse fracture of the distal left radial metaphysis extending into the distal radioulnar joint. The distal fracture fragment demonstrates 1/2 shaft with dorsal displacement, approximately 40 degrees dorsal angulation, and impaction with resultant mild ulnar positive variance. The transverse fracture plane extends into the distal radioulnar joint. A longitudinal component of the fracture extends into the lunate fossa of the distal radial articular surface. Radiocarpal articulation appears preserved. Radial inclination appears preserved. And associated mildly displaced fracture of the base of the ulnar styloid is identified. There is extensive surrounding soft tissue swelling. Advanced degenerative changes are noted at the base of the thumb at the first carpometacarpal joint. IMPRESSION: Comminuted, impacted, markedly angulated fracture of the distal left radius with intra-articular extension of the fracture plane. Mildly displaced fracture of the base of the ulnar styloid. Electronically Signed   By: Fidela Salisbury MD   On: 10/05/2020 06:40   DG Chest Portable 1 View  Result Date: 10/05/2020 CLINICAL DATA:  Fall EXAM: PORTABLE CHEST 1 VIEW COMPARISON:  08/21/2018 FINDINGS: Lungs are well expanded, symmetric, and clear. No pneumothorax or pleural effusion. Cardiac size within normal limits. Pulmonary vascularity is normal. Osseous structures are age-appropriate. No acute bone abnormality. IMPRESSION: No active disease. Electronically Signed   By: Fidela Salisbury MD   On: 10/05/2020 06:37    Procedures Procedures (including critical care time)  Medications Ordered in ED Medications - No data  to display  ED Course  I have reviewed the triage vital  signs and the nursing notes.  Pertinent labs & imaging results that were available during my care of the patient were reviewed by me and considered in my medical decision making (see chart for details).  Patient seen and examined. Work-up initiated. Discussed with Dr. Regenia Skeeter who will see. Patient is drowsy however there is an element of this chronically and symptoms also worsened after taking Valium. Will need head, face, c-spine imaging.   Vital signs reviewed and are as follows: BP (!) 167/79   Pulse (!) 102   Temp 98.8 F (37.1 C) (Oral)   Resp (!) 23   Ht 5\' 4"  (1.626 m)   Wt 46 kg   SpO2 99%   BMI 17.41 kg/m   10:14 AM Ortho has consulted. Would like to go surgery on the wrist this morning.   Patient continues to be very weak and not back to her baseline. She would likely benefit from admission, especially given imminent surgery. She will likely need PT/OT evaluation.   Spoke with Dr. Tamala Julian of [facility] who will see patient.   10:20 AM Reviewed results of CT imaging. No head bleed. No facial or spine fracture with caveat that imaging is motion degraded. I have low clinical suspicion for occult fracture in these areas at this time.     MDM Rules/Calculators/A&P                          Admit.    Final Clinical Impression(s) / ED Diagnoses Final diagnoses:  Closed fracture of left wrist, initial encounter  Fall, initial encounter  Altered level of consciousness    Rx / DC Orders ED Discharge Orders    None       Carlisle Cater, PA-C 10/05/20 1022    Carlisle Cater, PA-C 10/05/20 1051    Sherwood Gambler, MD 10/08/20 1515

## 2020-10-05 NOTE — Op Note (Signed)
Orthopaedic Surgery Operative Note (CSN: [id number] ) Date of Surgery: 10/05/2020  Admit Date: 10/05/2020   Diagnoses: Pre-Op Diagnoses: Left extra-articular distal radius fracture  Post-Op Diagnosis: Same  Procedures: CPT 25607-Open reduction internal fixation of left distal radius fracture   Surgeons : Primary: Roby Lofts, MD  Assistant: Ulyses Southward, PA-C  Location: OR 3   Anesthesia:Regional  Antibiotics: Ancef 2g preop with 1 gm vancomycin powder placed topically   Tourniquet time: Total Tourniquet Time Documented: Upper Arm (Left) - 27 minutes Total: Upper Arm (Left) - 27 minutes  Estimated Blood Loss:30 mL  Complications:None   Specimens:None   Implants: Implant Name Type Inv. Item Serial No. Manufacturer Lot No. LRB No. Used Action  DISTAL RADIUS VOL 2.4NAR 6/3H - [id number] Orthopedic Implant DISTAL RADIUS VOL 2.4NAR 6/3H  DEPUY ORTHOPAEDICS ON TRAY Left 1 Implanted  SCREW CORTEX SLFTPNG 2.4 - [id number] Screw SCREW CORTEX SLFTPNG 2.4  DEPUY ORTHOPAEDICS ON TRAY Left 1 Implanted  SCREW VA LOCKING 2.4X18 - [id number] Screw SCREW VA LOCKING 2.4X18  DEPUY ORTHOPAEDICS ON TRAY Left 1 Implanted  SCREW LOCKING 2.4X20 - [id number] Screw SCREW LOCKING 2.4X20  DEPUY ORTHOPAEDICS ON TRAY Left 1 Implanted  SCREW LOCKING 2.4X22 - [id number] Screw SCREW LOCKING 2.4X22  DEPUY ORTHOPAEDICS ON TRAY Left 2 Implanted  SCREW CORTEX 2.4X12MM - [id number] Screw SCREW CORTEX 2.4X12MM  DEPUY ORTHOPAEDICS ON TRAY Left 3 Implanted     Indications for Surgery: 74 year old female who sustained a ground-level fall.  She has a left extra-articular distal radius fracture.  She had significant shortening and dorsal angulation.  Due to the significant radiographic displacement I recommended proceeding to the operating room for open reduction internal fixation.  Risks and benefits were discussed with the patient and her husband.  Risks include but not limited to bleeding, infection,  malunion, nonunion, hardware failure, hardware irritation, nerve and blood vessel injury, even possibility anesthetic complications.  The patient agreed to proceed with surgery and consent was obtained.  Operative Findings: Open reduction internal fixation of left extra-articular distal radius fracture using Synthes volar VA 2.4 mm distal radius plate  Procedure: The patient was identified in the preoperative holding area. Consent was confirmed with the patient and their family and all questions were answered. The operative extremity was marked after confirmation with the patient. she was then brought back to the operating room by our anesthesia colleagues.  She was placed under sedation.  A tourniquet was placed to her upper arm.  The left upper extremity was then prepped and draped in usual sterile fashion.  A timeout was performed to verify the patient, the procedure, and the extremity.  Preoperative antibiotics were dosed.  Fluoroscopic imaging was obtained to show the unstable nature of her injury.  An Esmarch was used to exsanguinate the arm and the tourniquet was inflated to 250 mmHg.  A volar approach to the distal radius was carried down through skin and subcutaneous tissue.  I released the FCR tendon sheath and mobilized the tendon and incised through the volar tendon sheath.  I then swiped the finger flexors out of the way to expose the volar distal radius.  I released the pronator quadratus off of the distal radius to visualize the fracture and the volar surface of the radial shaft.  I then performed a reduction maneuver anatomically reducing the distal radius.  I then held it provisionally with a 1.6 mm K wire from the radial styloid across the fracture into the ulnar radial shaft.  I  confirmed the reduction with fluoroscopy.  I then placed a volar distal radius plate along the fracture and held it provisionally with K wires.  I confirmed positioning with fluoroscopy.  I then placed a  nonlocking screw into the distal metaphysis to bring the plate flush to bone.  I then placed locking screws into the distal segment.  I then placed nonlocking screws into the radial shaft to correct the volar tilt.  A total of 3 screws were placed in the radial shaft.  Final fluoroscopic imaging was obtained.  The tourniquet was deflated.  The incision was irrigated.  A gram of vancomycin powder was placed in the incision.  A layer closure of 2-0 Vicryl and 3-0 Monocryl with Steri-Strips were placed.  A resting volar splint was applied.  The patient was then awoken from anesthesia and taken to the PACU in stable condition.  Post Op Plan/Instructions: Patient will be nonweightbearing to left upper extremity.  She will receive postoperative Ancef.  No DVT prophylaxis is needed from an orthopedic perspective for upper extremity.  I would recommend either subcu heparin or Lovenox while she is inpatient.  She will be admitted under the hospitalist service.  She is safe to discharge from orthopedic perspective once cleared by the hospitalist.  I was present and performed the entire surgery.  Patrecia Pace, PA-C did assist me throughout the case. An assistant was necessary given the difficulty in approach, maintenance of reduction and ability to instrument the fracture.   Katha Hamming, MD [facility]

## 2020-10-05 NOTE — Anesthesia Postprocedure Evaluation (Signed)
Anesthesia Post Note  Patient: Mckenzie [name]  Procedure(s) Performed: OPEN REDUCTION INTERNAL FIXATION (ORIF) DISTAL RADIAL FRACTURE (Left Wrist)     Patient location during evaluation: PACU Anesthesia Type: Regional and MAC Level of consciousness: awake and alert Pain management: pain level controlled Vital Signs Assessment: post-procedure vital signs reviewed and stable Respiratory status: spontaneous breathing, nonlabored ventilation and respiratory function stable Cardiovascular status: blood pressure returned to baseline and stable Postop Assessment: no apparent nausea or vomiting Anesthetic complications: no   No complications documented.  Last Vitals:  Vitals:   10/05/20 1320 10/05/20 1335  BP: (!) 177/95 (!) 181/78  Pulse: 96 96  Resp: 16 19  Temp:    SpO2: 99% 100%    Last Pain:  Vitals:   10/05/20 1335  TempSrc:   PainSc: 0-No pain                 Pervis Hocking

## 2020-10-05 NOTE — Transfer of Care (Signed)
Immediate Anesthesia Transfer of Care Note  Patient: Mckenzie [name]  Procedure(s) Performed: OPEN REDUCTION INTERNAL FIXATION (ORIF) DISTAL RADIAL FRACTURE (Left Wrist)  Patient Location: PACU  Anesthesia Type:MAC and Regional  Level of Consciousness: awake and patient cooperative  Airway & Oxygen Therapy: Patient Spontanous Breathing and Patient connected to face mask oxygen  Post-op Assessment: Report given to RN and Post -op Vital signs reviewed and stable  Post vital signs: Reviewed and stable  Last Vitals:  Vitals Value Taken Time  BP 184/73 10/05/20 1304  Temp    Pulse 99 10/05/20 1307  Resp 20 10/05/20 1307  SpO2 100 % 10/05/20 1307  Vitals shown include unvalidated device data.  Last Pain:  Vitals:   10/05/20 1030  TempSrc: Oral         Complications: No complications documented.

## 2020-10-05 NOTE — Anesthesia Preprocedure Evaluation (Signed)
Anesthesia Evaluation  Patient identified by MRN, date of birth, ID band Patient awake    Reviewed: Allergy & Precautions, H&P , NPO status , Patient's Chart, lab work & pertinent test results  Airway Mallampati: II   Neck ROM: full    Dental   Pulmonary neg pulmonary ROS,    breath sounds clear to auscultation       Cardiovascular hypertension,  Rhythm:regular Rate:Normal     Neuro/Psych PSYCHIATRIC DISORDERS Depression    GI/Hepatic   Endo/Other    Renal/GU      Musculoskeletal Distal radius fx   Abdominal   Peds  Hematology  (+) anemia ,   Anesthesia Other Findings   Reproductive/Obstetrics                             Anesthesia Physical Anesthesia Plan  ASA: II  Anesthesia Plan: MAC and Regional   Post-op Pain Management:    Induction: Intravenous  PONV Risk Score and Plan: 2 and Ondansetron, Propofol infusion and Treatment may vary due to age or medical condition  Airway Management Planned: Simple Face Mask  Additional Equipment:   Intra-op Plan:   Post-operative Plan:   Informed Consent: I have reviewed the patients History and Physical, chart, labs and discussed the procedure including the risks, benefits and alternatives for the proposed anesthesia with the patient or authorized representative who has indicated his/her understanding and acceptance.       Plan Discussed with: CRNA, Anesthesiologist and Surgeon  Anesthesia Plan Comments:         Anesthesia Quick Evaluation

## 2020-10-05 NOTE — Consult Note (Signed)
Reason for Consult:Left wrist fx Referring Physician: Tyron Russell Time called: [id number] Time at bedside: Mckenzie [name] is an 74 y.o. female.  HPI: Chesney got up to go to the bathroom this morning and fell. The fall was unwitnessed and she was unable to articulate what caused the fall. She was brought to the ED where x-rays showed a left wrist fx and orthopedic surgery was consulted. Husband notes she's had some mental decline over the last 3-4 weeks compounded by Valium yesterday for a lumbar MRI. She has seen neurology but doesn't have results of any tests yet. She is RHD.  Past Medical History:  Diagnosis Date   Depression    High cholesterol    Hypertension    Retinal micro-aneurysm of right eye     Past Surgical History:  Procedure Laterality Date   EYE SURGERY     FINGER SURGERY Left    RING FINGER   TUBAL LIGATION      Family History  Problem Relation Age of Onset   Hyperlipidemia Mother    Hypertension Mother    Diabetes Mother    COPD Father     Social History:  reports that she has never smoked. She has never used smokeless tobacco. She reports that she does not drink alcohol and does not use drugs.  Allergies:  Allergies  Allergen Reactions   Aldomet [Methyldopa] Other (See Comments)    flu   Fosamax [Alendronate Sodium]    Acetazolamide Rash   Buspar [Buspirone] Palpitations    Medications: I have reviewed the patient's current medications.  Results for orders placed or performed during the hospital encounter of 10/05/20 (from the past 48 hour(s))  Lactic acid, plasma     Status: None   Collection Time: 10/05/20  6:22 AM  Result Value Ref Range   Lactic Acid, Venous 1.5 0.5 - 1.9 mmol/L    Comment: Performed at [facility], [address] [place], [place] [address]  Comprehensive metabolic panel     Status: Abnormal   Collection Time: 10/05/20  6:22 AM  Result Value Ref Range   Sodium 134 (L) 135 - 145 mmol/L    Potassium 3.4 (L) 3.5 - 5.1 mmol/L   Chloride 96 (L) 98 - 111 mmol/L   CO2 26 22 - 32 mmol/L   Glucose, Bld 138 (H) 70 - 99 mg/dL    Comment: Glucose reference range applies only to samples taken after fasting for at least 8 hours.   BUN 6 (L) 8 - 23 mg/dL   Creatinine, Ser 0.48 0.44 - 1.00 mg/dL   Calcium 9.0 8.9 - 10.3 mg/dL   Total Protein 7.0 6.5 - 8.1 g/dL   Albumin 2.4 (L) 3.5 - 5.0 g/dL   AST 27 15 - 41 U/L   ALT 29 0 - 44 U/L   Alkaline Phosphatase 82 38 - 126 U/L   Total Bilirubin 0.4 0.3 - 1.2 mg/dL   GFR, Estimated >60 >60 mL/min    Comment: (NOTE) Calculated using the CKD-EPI Creatinine Equation (2021)    Anion gap 12 5 - 15    Comment: Performed at [facility] [address] [place],  [address]  CBC with Differential     Status: Abnormal   Collection Time: 10/05/20  6:22 AM  Result Value Ref Range   WBC 14.2 (H) 4.0 - 10.5 K/uL   RBC 3.72 (L) 3.87 - 5.11 MIL/uL   Hemoglobin 10.4 (L) 12.0 -  15.0 g/dL   HCT 32.2 (L) 36.0 - 46.0 %   MCV 86.6 80.0 - 100.0 fL   MCH 28.0 26.0 - 34.0 pg   MCHC 32.3 30.0 - 36.0 g/dL   RDW 13.6 11.5 - 15.5 %   Platelets 613 (H) 150 - 400 K/uL   nRBC 0.0 0.0 - 0.2 %   Neutrophils Relative % 84 %   Neutro Abs 12.1 (H) 1.7 - 7.7 K/uL   Lymphocytes Relative 7 %   Lymphs Abs 1.0 0.7 - 4.0 K/uL   Monocytes Relative 7 %   Monocytes Absolute 0.9 0.1 - 1.0 K/uL   Eosinophils Relative 1 %   Eosinophils Absolute 0.1 0.0 - 0.5 K/uL   Basophils Relative 0 %   Basophils Absolute 0.0 0.0 - 0.1 K/uL   Immature Granulocytes 1 %   Abs Immature Granulocytes 0.10 (H) 0.00 - 0.07 K/uL    Comment: Performed at [facility] [address] [place], [place] [address]  CK     Status: None   Collection Time: 10/05/20  6:22 AM  Result Value Ref Range   Total CK 41 38 - 234 U/L    Comment: Performed at [facility], [place] [address] [place], [place] [address]  Resp Panel by RT-PCR (Flu A&B, Covid) Nasopharyngeal Swab     Status:  None   Collection Time: 10/05/20  6:43 AM   Specimen: Nasopharyngeal Swab; Nasopharyngeal(NP) swabs in vial transport medium  Result Value Ref Range   SARS Coronavirus 2 by RT PCR NEGATIVE NEGATIVE    Comment: (NOTE) SARS-CoV-2 target nucleic acids are NOT DETECTED.  The SARS-CoV-2 RNA is generally detectable in upper respiratory specimens during the acute phase of infection. The lowest concentration of SARS-CoV-2 viral copies this assay can detect is 138 copies/mL. A negative result does not preclude SARS-Cov-2 infection and should not be used as the sole basis for treatment or other patient management decisions. A negative result may occur with  improper specimen collection/handling, submission of specimen other than nasopharyngeal swab, presence of viral mutation(s) within the areas targeted by this assay, and inadequate number of viral copies(<138 copies/mL). A negative result must be combined with clinical observations, patient history, and epidemiological information. The expected result is Negative.  Fact Sheet for Patients:  EntrepreneurPulse.com.au  Fact Sheet for Healthcare Providers:  IncredibleEmployment.be  This test is no t yet approved or cleared by the [place] FDA and  has been authorized for detection and/or diagnosis of SARS-CoV-2 by FDA under an Emergency Use Authorization (EUA). This EUA will remain  in effect (meaning this test can be used) for the duration of the COVID-19 declaration under Section 564(b)(1) of the Act, 21 U.S.C.section 360bbb-3(b)(1), unless the authorization is terminated  or revoked sooner.       Influenza A by PCR NEGATIVE NEGATIVE   Influenza B by PCR NEGATIVE NEGATIVE    Comment: (NOTE) The Xpert Xpress SARS-CoV-2/FLU/RSV plus assay is intended as an aid in the diagnosis of influenza from Nasopharyngeal swab specimens and should not be used as a sole basis for treatment. Nasal washings  and aspirates are unacceptable for Xpert Xpress SARS-CoV-2/FLU/RSV testing.  Fact Sheet for Patients: EntrepreneurPulse.com.au  Fact Sheet for Healthcare Providers: IncredibleEmployment.be  This test is not yet approved or cleared by the [place] FDA and has been authorized for detection and/or diagnosis of SARS-CoV-2 by FDA under an Emergency Use Authorization (EUA). This EUA will remain in effect (meaning this test can  be used) for the duration of the COVID-19 declaration under Section 564(b)(1) of the Act, 21 U.S.C. section 360bbb-3(b)(1), unless the authorization is terminated or revoked.  Performed at [facility], [address], [place], [place] [address]     DG Wrist Complete Left  Result Date: 10/05/2020 CLINICAL DATA:  Fall, left wrist pain EXAM: LEFT WRIST - COMPLETE 3+ VIEW COMPARISON:  None. FINDINGS: Three view radiograph left wrist demonstrates an acute, mildly comminuted transverse fracture of the distal left radial metaphysis extending into the distal radioulnar joint. The distal fracture fragment demonstrates 1/2 shaft with dorsal displacement, approximately 40 degrees dorsal angulation, and impaction with resultant mild ulnar positive variance. The transverse fracture plane extends into the distal radioulnar joint. A longitudinal component of the fracture extends into the lunate fossa of the distal radial articular surface. Radiocarpal articulation appears preserved. Radial inclination appears preserved. And associated mildly displaced fracture of the base of the ulnar styloid is identified. There is extensive surrounding soft tissue swelling. Advanced degenerative changes are noted at the base of the thumb at the first carpometacarpal joint. IMPRESSION: Comminuted, impacted, markedly angulated fracture of the distal left radius with intra-articular extension of the fracture plane. Mildly displaced fracture of the base of the  ulnar styloid. Electronically Signed   By: Helyn Numbers MD   On: 10/05/2020 06:40   DG Chest Portable 1 View  Result Date: 10/05/2020 CLINICAL DATA:  Fall EXAM: PORTABLE CHEST 1 VIEW COMPARISON:  08/21/2018 FINDINGS: Lungs are well expanded, symmetric, and clear. No pneumothorax or pleural effusion. Cardiac size within normal limits. Pulmonary vascularity is normal. Osseous structures are age-appropriate. No acute bone abnormality. IMPRESSION: No active disease. Electronically Signed   By: Helyn Numbers MD   On: 10/05/2020 06:37    Review of Systems  HENT: Negative for ear discharge, ear pain, hearing loss and tinnitus.   Eyes: Negative for photophobia and pain.  Respiratory: Negative for cough and shortness of breath.   Cardiovascular: Negative for chest pain.  Gastrointestinal: Negative for abdominal pain, nausea and vomiting.  Genitourinary: Negative for dysuria, flank pain, frequency and urgency.  Musculoskeletal: Positive for arthralgias (Left wrist) and back pain. Negative for myalgias and neck pain.  Neurological: Negative for dizziness and headaches.  Hematological: Does not bruise/bleed easily.  Psychiatric/Behavioral: Positive for confusion. The patient is not nervous/anxious.    Blood pressure (!) 167/79, pulse (!) 102, temperature 98.8 F (37.1 C), temperature source Oral, resp. rate (!) 23, height 5\' 4"  (1.626 m), weight 46 kg, SpO2 99 %. Physical Exam Constitutional:      General: She is not in acute distress.    Appearance: She is well-developed and well-nourished. She is not diaphoretic.  HENT:     Head: Normocephalic and atraumatic.  Eyes:     General: No scleral icterus.       Right eye: No discharge.        Left eye: No discharge.     Conjunctiva/sclera: Conjunctivae normal.  Cardiovascular:     Rate and Rhythm: Normal rate and regular rhythm.  Pulmonary:     Effort: Pulmonary effort is normal. No respiratory distress.  Musculoskeletal:     Cervical back:  Normal range of motion.     Comments: Left shoulder, elbow, wrist, digits- no skin wounds, mod TTP, no instability, no blocks to motion  Sens  Ax/R/M/U intact  Mot   Ax/ R/ PIN/ M/ AIN/ U intact  Rad 2+  Skin:    General: Skin  is warm and dry.  Psychiatric:        Mood and Affect: Mood and affect normal.        Speech: Speech is delayed.        Behavior: Behavior normal.     Assessment/Plan: Left wrist fx -- Plan ORIF today by Dr. Doreatha Martin. Please keep NPO. AMS -- Suspect will need medical admit for workup as the fall could have been 2/2 the same process. Multiple medical problems including depression, HTN, and HLD -- per primary service    Lisette Abu, PA-C Orthopedic Surgery [phone] 10/05/2020, 9:56 AM

## 2020-10-05 NOTE — Anesthesia Procedure Notes (Signed)
Anesthesia Regional Block: Supraclavicular block   Pre-Anesthetic Checklist: ,, timeout performed, Correct Patient, Correct Site, Correct Laterality, Correct Procedure, Correct Position, site marked, Risks and benefits discussed,  Surgical consent,  Pre-op evaluation,  At surgeon's request and post-op pain management  Laterality: Left  Prep: chloraprep       Needles:  Injection technique: Single-shot  Needle Type: Echogenic Stimulator Needle     Needle Length: 5cm  Needle Gauge: 22     Additional Needles:   Procedures:, nerve stimulator,,,,,,,   Nerve Stimulator or Paresthesia:  Response: biceps flexion, 0.45 mA,   Additional Responses:   Narrative:  Start time: 10/05/2020 11:25 AM End time: 10/05/2020 11:33 AM Injection made incrementally with aspirations every 5 mL.  Performed by: Personally  Anesthesiologist: Achille Rich, MD  Additional Notes: Functioning IV was confirmed and monitors were applied.  A 19mm 22ga Arrow echogenic stimulator needle was used. Sterile prep and drape,hand hygiene and sterile gloves were used.  Negative aspiration and negative test dose prior to incremental administration of local anesthetic. The patient tolerated the procedure well.  Ultrasound guidance: relevent anatomy identified, needle position confirmed, local anesthetic spread visualized around nerve(s), vascular puncture avoided.  Image printed for medical record.

## 2020-10-06 ENCOUNTER — Other Ambulatory Visit: Payer: Self-pay

## 2020-10-06 DIAGNOSIS — S52502A Unspecified fracture of the lower end of left radius, initial encounter for closed fracture: Secondary | ICD-10-CM

## 2020-10-06 DIAGNOSIS — W19XXXA Unspecified fall, initial encounter: Secondary | ICD-10-CM

## 2020-10-06 DIAGNOSIS — Y92009 Unspecified place in unspecified non-institutional (private) residence as the place of occurrence of the external cause: Secondary | ICD-10-CM | POA: Diagnosis not present

## 2020-10-06 DIAGNOSIS — T148XXA Other injury of unspecified body region, initial encounter: Secondary | ICD-10-CM | POA: Diagnosis present

## 2020-10-06 LAB — URINALYSIS, ROUTINE W REFLEX MICROSCOPIC
Bilirubin Urine: NEGATIVE
Glucose, UA: 150 mg/dL — AB
Hgb urine dipstick: NEGATIVE
Ketones, ur: NEGATIVE mg/dL
Nitrite: NEGATIVE
Protein, ur: NEGATIVE mg/dL
Specific Gravity, Urine: 1.01 (ref 1.005–1.030)
pH: 6 (ref 5.0–8.0)

## 2020-10-06 LAB — BASIC METABOLIC PANEL
Anion gap: 11 (ref 5–15)
BUN: 5 mg/dL — ABNORMAL LOW (ref 8–23)
CO2: 26 mmol/L (ref 22–32)
Calcium: 8.7 mg/dL — ABNORMAL LOW (ref 8.9–10.3)
Chloride: 98 mmol/L (ref 98–111)
Creatinine, Ser: 0.43 mg/dL — ABNORMAL LOW (ref 0.44–1.00)
GFR, Estimated: >60 mL/min (ref >60)
Glucose, Bld: 148 mg/dL — ABNORMAL HIGH (ref 70–99)
Potassium: 3.6 mmol/L (ref 3.5–5.1)
Sodium: 135 mmol/L (ref 135–145)

## 2020-10-06 LAB — PREALBUMIN: Prealbumin: 10.6 mg/dL — ABNORMAL LOW (ref 18–38)

## 2020-10-06 LAB — CBC
HCT: 30 % — ABNORMAL LOW (ref 36.0–46.0)
Hemoglobin: 9.5 g/dL — ABNORMAL LOW (ref 12.0–15.0)
MCH: 27.3 pg (ref 26.0–34.0)
MCHC: 31.7 g/dL (ref 30.0–36.0)
MCV: 86.2 fL (ref 80.0–100.0)
Platelets: 482 10*3/uL — ABNORMAL HIGH (ref 150–400)
RBC: 3.48 MIL/uL — ABNORMAL LOW (ref 3.87–5.11)
RDW: 13.3 % (ref 11.5–15.5)
WBC: 11.5 10*3/uL — ABNORMAL HIGH (ref 4.0–10.5)
nRBC: 0 % (ref 0.0–0.2)

## 2020-10-06 LAB — RAPID URINE DRUG SCREEN, HOSP PERFORMED
Amphetamines: NOT DETECTED
Barbiturates: NOT DETECTED
Benzodiazepines: POSITIVE — AB
Cocaine: NOT DETECTED
Opiates: NOT DETECTED
Tetrahydrocannabinol: NOT DETECTED

## 2020-10-06 LAB — MAGNESIUM: Magnesium: 1.9 mg/dL (ref 1.7–2.4)

## 2020-10-06 MED ORDER — BRIMONIDINE TARTRATE-TIMOLOL 0.2-0.5 % OP SOLN: 1.0000 [drp] | Freq: Two times a day (BID) | OPHTHALMIC | Status: DC

## 2020-10-06 MED ORDER — LISINOPRIL 40 MG PO TABS
40.0000 mg | ORAL_TABLET | Freq: Every day | ORAL | Status: DC
  Administered 2020-10-06: 40 mg via ORAL
  Filled 2020-10-06: qty 1

## 2020-10-06 MED ORDER — AMLODIPINE BESYLATE 2.5 MG PO TABS
2.5000 mg | ORAL_TABLET | Freq: Two times a day (BID) | ORAL | Status: DC
  Administered 2020-10-06: 2.5 mg via ORAL
  Filled 2020-10-06: qty 1

## 2020-10-06 MED ORDER — FLUOROMETHOLONE 0.1 % OP SUSP
1.0000 [drp] | Freq: Four times a day (QID) | OPHTHALMIC | Status: DC
  Filled 2020-10-06: qty 5

## 2020-10-06 MED ORDER — BRIMONIDINE TARTRATE 0.2 % OP SOLN
1.0000 [drp] | Freq: Two times a day (BID) | OPHTHALMIC | Status: DC
  Administered 2020-10-06: 1 [drp] via OPHTHALMIC
  Filled 2020-10-06: qty 5

## 2020-10-06 MED ORDER — DORZOLAMIDE HCL 2 % OP SOLN
1.0000 [drp] | Freq: Two times a day (BID) | OPHTHALMIC | Status: DC
  Filled 2020-10-06: qty 10

## 2020-10-06 MED ORDER — TIMOLOL MALEATE 0.5 % OP SOLN
1.0000 [drp] | Freq: Two times a day (BID) | OPHTHALMIC | Status: DC
  Administered 2020-10-06: 1 [drp] via OPHTHALMIC
  Filled 2020-10-06: qty 5

## 2020-10-06 MED ORDER — OXYCODONE HCL 5 MG PO TABS: 5.0000 mg | ORAL_TABLET | Freq: Three times a day (TID) | ORAL | 0 refills | Status: DC | PRN

## 2020-10-06 MED ORDER — HYPROMELLOSE (GONIOSCOPIC) 2.5 % OP SOLN
1.0000 [drp] | OPHTHALMIC | Status: DC | PRN
  Filled 2020-10-06: qty 15

## 2020-10-06 MED ORDER — ACETAMINOPHEN 500 MG PO TABS: 1000.0000 mg | ORAL_TABLET | Freq: Four times a day (QID) | ORAL | 0 refills | Status: AC

## 2020-10-06 NOTE — Discharge Summary (Addendum)
Physician Discharge Summary  Mckenzie [name] VVO:[id number] DOB: 1945-10-24 DOA: 10/05/2020  PCP: Loman Brooklyn, FNP  Admit date: 10/05/2020 Discharge date: 10/06/2020  Admitted From: home Discharge disposition: home   Recommendations for Outpatient Follow-Up:   1. Family refused PT/OT eval but patient walked well with nurse in hallway 2. Leave on splint until ortho follow up- keep clean and dry 3. Monitor BP- increase/add meds as needed 4. Follow up on vit D level   Discharge Diagnosis:   Principal Problem:   Distal radius fracture Active Problems:   Hypertension   Hyperlipidemia   Fall at home, initial encounter   Acute metabolic encephalopathy   Fracture    Discharge Condition: Improved.  Diet recommendation: Low sodium, heart healthy.   Code status: Full.   History of Present Illness:   Mckenzie [name] is a 74 y.o. female with medical history significant of hypertension, hyperlipidemia, and depression presents after fall at home.  History is obtained from the patient's husband present at bedside.  He reports that over the last month the patient had been more confused that he describes as her being disoriented at times and staring off.  Normally she had been alert and oriented x3.  She had also been also having complaints of pain all over.  Symptoms initially started in her neck and then went to her back.  Patient was needing to use her hands to sit up from chair she has been being followed for the symptoms by her primary care provider, Dr. Gladstone Lighter of orthopedics, and Dr. Felecia Shelling of neurolgy.  Previously prescribed prednisone without any improvement in pain complaints.  Previous work-up had included ESR, CRP, ANA, uric acid, SPEP/IEF and UPEP which were reported to be normal.    She  seen Dr. Felecia Shelling on 12/21 and they had checked CK, acetylcholine receptor binding, and TSH which were also within normal limits. There been concerned for the possibility of a  pinched nerve and had ordered an MRI of her lumbar spine which was done yesterday.  Prior to the MRI patient had taken to Valium.  This morning at around 4 AM the patient got in bed and fell injuring her left hand.  Patient had not had any other recent medication changes to his knowledge.   Hospital Course by Problem:   Communicated left distal radius fracture secondary to fall: Acute.  Patient had an unwitnessed fall this morning suffering communicated fracture of the left distal radius.  Patient had been given Valium yesterday which may have contributed to the fall. -ortho consult:  NWB LUE, can use sling for comfort, maintain splint  Acute metabolic encephalopathy:   CT imaging of the brain was negative for any acute abnormalities.  TSH was just recently checked and noted to be 2.1.  Patient's acute symptoms may be more so related with recently receiving Valium. -resolved  Proximal muscle weakness: Patient currently under the care of Dr. Felecia Shelling and Gladstone Lighter being worked up in the outpatient setting.  Work-up including ESR, CRP, ANA, uric acid, SPEP/IEF, UPEP, CK, acetylcholine receptor binding, and TSH have been within normal limits. 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 -repleted  Right foraminal impingement of C5-C6: Incidentally seen on CT imaging of the  cervical spine suspected secondary to spurring.  has outpatient follow up    Medical Consultants:   ortho   Discharge Exam:   Vitals:   10/06/20 0801 10/06/20 1138  BP: (!) 170/79 (!) 153/76  Pulse: 91 90  Resp: 16 18  Temp: 98 F (36.7 C) 98.3 F (36.8 C)  SpO2: 98% 98%   Vitals:   10/06/20  0015 10/06/20 0420 10/06/20 0801 10/06/20 1138  BP: (!) 160/88 (!) 143/79 (!) 170/79 (!) 153/76  Pulse: 88 98 91 90  Resp: _0 Temp: 98.2 F (36.8 C) 97.7 F (36.5 C) 98 F (36.7 C) 98.3 F (36.8 C)  TempSrc: Oral Oral Oral Oral  SpO2: 98% 100% 98% 98%  Weight:      Height:        General exam: Appears calm and comfortable.   The results of significant diagnostics from this hospitalization (including imaging, microbiology, ancillary and laboratory) are listed below for reference.     Procedures and Diagnostic Studies:   DG Wrist Complete Left  Result Date: 10/05/2020 CLINICAL DATA:  The patient suffered a left wrist fracture in a fall 10/05/2020. Status post ORIF. Initial encounter. EXAM: LEFT WRIST - COMPLETE 3+ VIEW COMPARISON:  Plain films left wrist 10/05/2020. FINDINGS: New volar plate and screws are in place for fixation of a distal radius fracture. Hardware is intact. Position and alignment are markedly improved. Ulnar styloid fracture is again seen. Bones are osteopenic. The patient is in a volar splint. IMPRESSION: Marked improvement in position and alignment after fixation of a distal radius fracture. No acute abnormality. Electronically Signed   By: Inge Rise M.D.   On: 10/05/2020 14:19   DG Wrist Complete Left  Result Date: 10/05/2020 CLINICAL DATA:  ORIF left wrist. EXAM: DG C-ARM 1-60 MIN; LEFT WRIST - COMPLETE 3+ VIEW FLUOROSCOPY TIME:  Fluoroscopy Time:  35 seconds. Radiation: 0.61 mGy. COMPARISON:  Wrist radiographs October 05, 2020. FINDINGS: Five C-arm fluoroscopic images were obtained intraoperatively and submitted for post operative interpretation. These images demonstrate postsurgical changes of plate and screw fixation of a distal left radius fracture. Please see the performing provider's procedural report for further detail. IMPRESSION: Intraoperative fluoroscopic imaging, as detailed above. Electronically Signed   By: Margaretha Sheffield MD   On:  10/05/2020 13:15   DG Wrist Complete Left  Result Date: 10/05/2020 CLINICAL DATA:  Fall, left wrist pain EXAM: LEFT WRIST - COMPLETE 3+ VIEW COMPARISON:  None. FINDINGS: Three view radiograph left wrist demonstrates an acute, mildly comminuted transverse fracture of the distal left radial metaphysis extending into the distal radioulnar joint. The distal fracture fragment demonstrates 1/2 shaft with dorsal displacement, approximately 40 degrees dorsal angulation, and impaction with resultant mild ulnar positive variance. The transverse fracture plane extends into the distal radioulnar joint. A longitudinal component of the fracture extends into the lunate fossa of the distal radial articular surface. Radiocarpal articulation appears preserved. Radial inclination appears preserved. And associated mildly displaced fracture of the base of the ulnar styloid is identified. There is extensive surrounding soft tissue swelling. Advanced degenerative changes are noted at the base of the thumb at the first carpometacarpal joint. IMPRESSION: Comminuted, impacted, markedly angulated fracture of the distal left radius with intra-articular extension of the fracture plane. Mildly displaced fracture of the base of the ulnar styloid. Electronically Signed   By: Fidela Salisbury MD   On: 10/05/2020 06:40   CT Head Wo Contrast  Result Date: 10/05/2020 CLINICAL DATA:  Fall, left periorbital hematoma EXAM: CT HEAD WITHOUT CONTRAST CT MAXILLOFACIAL WITHOUT CONTRAST CT CERVICAL SPINE WITHOUT CONTRAST TECHNIQUE: Multidetector CT imaging of the head, cervical spine, and maxillofacial structures were performed using the standard protocol without intravenous contrast. Multiplanar CT image reconstructions of the cervical spine and maxillofacial structures were also generated. COMPARISON:  01/10/2016 head CT FINDINGS: Despite efforts by the technologist and patient, motion artifact is present on today's exam and could not be eliminated.  This reduces exam sensitivity and specificity. In particular, the facial bones sequence was most severely affected. CT HEAD FINDINGS Brain: The brainstem, cerebellum, cerebral peduncles, thalami, basal ganglia, basilar cisterns, and ventricular system appear within normal limits. No intracranial hemorrhage, mass lesion, or acute CVA. Vascular: Unremarkable Skull: Unremarkable Other: No supplemental non-categorized findings. CT MAXILLOFACIAL FINDINGS Osseous: Severely degraded images lowering diagnostic sensitivity and specificity. No large or obvious fracture. The head CT images provided better depiction from the maxillary sinuses upward. If there is a high clinical index of suspicion of occult fracture, repeat attempts at scanning should be considered given how degraded these images are. Orbits: Grossly unremarkable Sinuses: Unremarkable Soft tissues: Soft tissues suspected left infraorbital soft tissue swelling. CT CERVICAL SPINE FINDINGS Moderate motion artifact. Alignment: No significant cervical malalignment. Skull base and vertebrae: Small spurring from the anterior arch of C1 is interposed between the basion and the tip of the odontoid. No appreciable fracture or acute bony findings. Soft tissues and spinal canal: Bilateral common carotid atherosclerotic calcification. Disc levels: There is right foraminal stenosis at C5-6 due to facet and uncinate spurring. Upper chest: Biapical pleuroparenchymal scarring. Other: No supplemental non-categorized findings. IMPRESSION: 1. No acute intracranial findings, discrete facial fracture, or cervical spine fracture/acute subluxation is identified. However, please note that there is severe motion artifact on the maxillofacial images which could obscure subtle fractures. The patient was taken directly to the operating room for left wrist ORIF and accordingly repeat facial CT images could not be immediately obtained, but if there is high clinical suspicion of potential  acute facial fracture, we are happy to have the patient return for additional facial imaging data set acquisition and can issue an addendum to this report once those are obtained. 2. Right foraminal impingement at C5-6 due to spurring. 3. Mild carotid atherosclerosis. Electronically Signed   By: Van Clines M.D.   On: 10/05/2020 10:14   CT Cervical Spine Wo Contrast  Result Date: 10/05/2020 CLINICAL DATA:  Fall, left periorbital hematoma EXAM: CT HEAD WITHOUT CONTRAST CT MAXILLOFACIAL WITHOUT CONTRAST CT CERVICAL SPINE WITHOUT CONTRAST TECHNIQUE: Multidetector CT imaging of the head, cervical spine, and maxillofacial structures were performed using the standard protocol without intravenous contrast. Multiplanar CT image reconstructions of the cervical spine and maxillofacial structures were also generated. COMPARISON:  01/10/2016 head CT FINDINGS: Despite efforts by the technologist and patient, motion artifact is present on today's exam and could not be eliminated. This reduces exam sensitivity and specificity. In particular, the facial bones sequence was most severely affected. CT HEAD FINDINGS Brain: The brainstem, cerebellum, cerebral peduncles, thalami, basal ganglia, basilar cisterns, and ventricular system appear within normal limits. No intracranial hemorrhage, mass lesion, or acute CVA. Vascular: Unremarkable Skull: Unremarkable Other: No supplemental non-categorized findings. CT MAXILLOFACIAL FINDINGS Osseous: Severely degraded images lowering diagnostic sensitivity and specificity. No large or obvious fracture. The head CT images provided better depiction from the maxillary sinuses upward. If there is a high clinical index of suspicion of occult fracture, repeat attempts at scanning should be considered given how  degraded these images are. Orbits: Grossly unremarkable Sinuses: Unremarkable Soft tissues: Soft tissues suspected left infraorbital soft tissue swelling. CT CERVICAL SPINE FINDINGS  Moderate motion artifact. Alignment: No significant cervical malalignment. Skull base and vertebrae: Small spurring from the anterior arch of C1 is interposed between the basion and the tip of the odontoid. No appreciable fracture or acute bony findings. Soft tissues and spinal canal: Bilateral common carotid atherosclerotic calcification. Disc levels: There is right foraminal stenosis at C5-6 due to facet and uncinate spurring. Upper chest: Biapical pleuroparenchymal scarring. Other: No supplemental non-categorized findings. IMPRESSION: 1. No acute intracranial findings, discrete facial fracture, or cervical spine fracture/acute subluxation is identified. However, please note that there is severe motion artifact on the maxillofacial images which could obscure subtle fractures. The patient was taken directly to the operating room for left wrist ORIF and accordingly repeat facial CT images could not be immediately obtained, but if there is high clinical suspicion of potential acute facial fracture, we are happy to have the patient return for additional facial imaging data set acquisition and can issue an addendum to this report once those are obtained. 2. Right foraminal impingement at C5-6 due to spurring. 3. Mild carotid atherosclerosis. Electronically Signed   By: Van Clines M.D.   On: 10/05/2020 10:14   DG Chest Portable 1 View  Result Date: 10/05/2020 CLINICAL DATA:  Fall EXAM: PORTABLE CHEST 1 VIEW COMPARISON:  08/21/2018 FINDINGS: Lungs are well expanded, symmetric, and clear. No pneumothorax or pleural effusion. Cardiac size within normal limits. Pulmonary vascularity is normal. Osseous structures are age-appropriate. No acute bone abnormality. IMPRESSION: No active disease. Electronically Signed   By: Fidela Salisbury MD   On: 10/05/2020 06:37   DG C-Arm 1-60 Min  Result Date: 10/05/2020 CLINICAL DATA:  ORIF left wrist. EXAM: DG C-ARM 1-60 MIN; LEFT WRIST - COMPLETE 3+ VIEW FLUOROSCOPY TIME:   Fluoroscopy Time:  35 seconds. Radiation: 0.61 mGy. COMPARISON:  Wrist radiographs October 05, 2020. FINDINGS: Five C-arm fluoroscopic images were obtained intraoperatively and submitted for post operative interpretation. These images demonstrate postsurgical changes of plate and screw fixation of a distal left radius fracture. Please see the performing provider's procedural report for further detail. IMPRESSION: Intraoperative fluoroscopic imaging, as detailed above. Electronically Signed   By: Margaretha Sheffield MD   On: 10/05/2020 13:15   CT Maxillofacial Wo Contrast  Result Date: 10/05/2020 CLINICAL DATA:  Fall, left periorbital hematoma EXAM: CT HEAD WITHOUT CONTRAST CT MAXILLOFACIAL WITHOUT CONTRAST CT CERVICAL SPINE WITHOUT CONTRAST TECHNIQUE: Multidetector CT imaging of the head, cervical spine, and maxillofacial structures were performed using the standard protocol without intravenous contrast. Multiplanar CT image reconstructions of the cervical spine and maxillofacial structures were also generated. COMPARISON:  01/10/2016 head CT FINDINGS: Despite efforts by the technologist and patient, motion artifact is present on today's exam and could not be eliminated. This reduces exam sensitivity and specificity. In particular, the facial bones sequence was most severely affected. CT HEAD FINDINGS Brain: The brainstem, cerebellum, cerebral peduncles, thalami, basal ganglia, basilar cisterns, and ventricular system appear within normal limits. No intracranial hemorrhage, mass lesion, or acute CVA. Vascular: Unremarkable Skull: Unremarkable Other: No supplemental non-categorized findings. CT MAXILLOFACIAL FINDINGS Osseous: Severely degraded images lowering diagnostic sensitivity and specificity. No large or obvious fracture. The head CT images provided better depiction from the maxillary sinuses upward. If there is a high clinical index of suspicion of occult fracture, repeat attempts at scanning should be  considered given how degraded these images are. Orbits:  Grossly unremarkable Sinuses: Unremarkable Soft tissues: Soft tissues suspected left infraorbital soft tissue swelling. CT CERVICAL SPINE FINDINGS Moderate motion artifact. Alignment: No significant cervical malalignment. Skull base and vertebrae: Small spurring from the anterior arch of C1 is interposed between the basion and the tip of the odontoid. No appreciable fracture or acute bony findings. Soft tissues and spinal canal: Bilateral common carotid atherosclerotic calcification. Disc levels: There is right foraminal stenosis at C5-6 due to facet and uncinate spurring. Upper chest: Biapical pleuroparenchymal scarring. Other: No supplemental non-categorized findings. IMPRESSION: 1. No acute intracranial findings, discrete facial fracture, or cervical spine fracture/acute subluxation is identified. However, please note that there is severe motion artifact on the maxillofacial images which could obscure subtle fractures. The patient was taken directly to the operating room for left wrist ORIF and accordingly repeat facial CT images could not be immediately obtained, but if there is high clinical suspicion of potential acute facial fracture, we are happy to have the patient return for additional facial imaging data set acquisition and can issue an addendum to this report once those are obtained. 2. Right foraminal impingement at C5-6 due to spurring. 3. Mild carotid atherosclerosis. Electronically Signed   By: Van Clines M.D.   On: 10/05/2020 10:14     Labs:   Basic Metabolic Panel: Recent Labs  Lab 10/05/20 0622 10/06/20 0152  NA 134* 135  K 3.4* 3.6  CL 96* 98  CO2 26 26  GLUCOSE 138* 148*  BUN 6* 5*  CREATININE 0.48 0.43*  CALCIUM 9.0 8.7*  MG  --  1.9   GFR Estimated Creatinine Clearance: 44.1 mL/min (A) (by C-G formula based on SCr of 0.43 mg/dL (L)). Liver Function Tests: Recent Labs  Lab 10/05/20 0622  AST 27  ALT 29   ALKPHOS 82  BILITOT 0.4  PROT 7.0  ALBUMIN 2.4*   No results for input(s): LIPASE, AMYLASE in the last 168 hours. No results for input(s): AMMONIA in the last 168 hours. Coagulation profile No results for input(s): INR, PROTIME in the last 168 hours.  CBC: Recent Labs  Lab 10/05/20 0622 10/06/20 0152  WBC 14.2* 11.5*  NEUTROABS 12.1*  --   HGB 10.4* 9.5*  HCT 32.2* 30.0*  MCV 86.6 86.2  PLT 613* 482*   Cardiac Enzymes: Recent Labs  Lab 10/05/20 0622  CKTOTAL 41   BNP: Invalid input(s): POCBNP CBG: No results for input(s): GLUCAP in the last 168 hours. D-Dimer No results for input(s): DDIMER in the last 72 hours. Hgb A1c No results for input(s): HGBA1C in the last 72 hours. Lipid Profile No results for input(s): CHOL, HDL, LDLCALC, TRIG, CHOLHDL, LDLDIRECT in the last 72 hours. Thyroid function studies No results for input(s): TSH, T4TOTAL, T3FREE, THYROIDAB in the last 72 hours.  Invalid input(s): FREET3 Anemia work up No results for input(s): VITAMINB12, FOLATE, FERRITIN, TIBC, IRON, RETICCTPCT in the last 72 hours. Microbiology Recent Results (from the past 240 hour(s))  Resp Panel by RT-PCR (Flu A&B, Covid) Nasopharyngeal Swab     Status: None   Collection Time: 10/05/20  6:43 AM   Specimen: Nasopharyngeal Swab; Nasopharyngeal(NP) swabs in vial transport medium  Result Value Ref Range Status   SARS Coronavirus 2 by RT PCR NEGATIVE NEGATIVE Final    Comment: (NOTE) SARS-CoV-2 target nucleic acids are NOT DETECTED.  The SARS-CoV-2 RNA is generally detectable in upper respiratory specimens during the acute phase of infection. The lowest concentration of SARS-CoV-2 viral copies this assay can detect is 138  copies/mL. A negative result does not preclude SARS-Cov-2 infection and should not be used as the sole basis for treatment or other patient management decisions. A negative result may occur with  improper specimen collection/handling, submission of  specimen other than nasopharyngeal swab, presence of viral mutation(s) within the areas targeted by this assay, and inadequate number of viral copies(<138 copies/mL). A negative result must be combined with clinical observations, patient history, and epidemiological information. The expected result is Negative.  Fact Sheet for Patients:  EntrepreneurPulse.com.au  Fact Sheet for Healthcare Providers:  IncredibleEmployment.be  This test is no t yet approved or cleared by the [place] FDA and  has been authorized for detection and/or diagnosis of SARS-CoV-2 by FDA under an Emergency Use Authorization (EUA). This EUA will remain  in effect (meaning this test can be used) for the duration of the COVID-19 declaration under Section 564(b)(1) of the Act, 21 U.S.C.section 360bbb-3(b)(1), unless the authorization is terminated  or revoked sooner.       Influenza A by PCR NEGATIVE NEGATIVE Final   Influenza B by PCR NEGATIVE NEGATIVE Final    Comment: (NOTE) The Xpert Xpress SARS-CoV-2/FLU/RSV plus assay is intended as an aid in the diagnosis of influenza from Nasopharyngeal swab specimens and should not be used as a sole basis for treatment. Nasal washings and aspirates are unacceptable for Xpert Xpress SARS-CoV-2/FLU/RSV testing.  Fact Sheet for Patients: EntrepreneurPulse.com.au  Fact Sheet for Healthcare Providers: IncredibleEmployment.be  This test is not yet approved or cleared by the [place] FDA and has been authorized for detection and/or diagnosis of SARS-CoV-2 by FDA under an Emergency Use Authorization (EUA). This EUA will remain in effect (meaning this test can be used) for the duration of the COVID-19 declaration under Section 564(b)(1) of the Act, 21 U.S.C. section 360bbb-3(b)(1), unless the authorization is terminated or revoked.  Performed at [facility], [place] [address],  [place], [place] [address]   Surgical pcr screen     Status: None   Collection Time: 10/05/20 10:42 AM   Specimen: Nasal Mucosa; Nasal Swab  Result Value Ref Range Status   MRSA, PCR NEGATIVE NEGATIVE Final   Staphylococcus aureus NEGATIVE NEGATIVE Final    Comment: (NOTE) The Xpert SA Assay (FDA approved for NASAL specimens in patients 48 years of age and older), is one component of a comprehensive surveillance program. It is not intended to diagnose infection nor to guide or monitor treatment. Performed at [facility], [place] [address], [place], [place] [address]      Discharge Instructions:   Discharge Instructions    Diet - low sodium heart healthy   Complete by: As directed    Discharge instructions   Complete by: As directed    Would take scheduled tylenol 4x/day for 7 days and only use oxycodone if tylenol not working for pain nonweightbearing to left upper extremity Can use sling as needed but leave splint in place and keep clean/dry   Increase activity slowly   Complete by: As directed    No wound care   Complete by: As directed      Allergies as of 10/06/2020      Reactions   Aldomet [methyldopa] Other (See Comments)   flu   Fosamax [alendronate Sodium]    Acetazolamide Rash   Buspar [buspirone] Palpitations      Medication List    TAKE these medications   acetaminophen 500 MG tablet Commonly known as: TYLENOL Take 2 tablets (1,000 mg  total) by mouth 4 (four) times daily for 7 days. What changed:   when to take this  reasons to take this   amLODipine 2.5 MG tablet Commonly known as: NORVASC Take 1 tablet (2.5 mg total) by mouth 2 (two) times daily. (Needs to see new provider)   Combigan 0.2-0.5 % ophthalmic solution Generic drug: brimonidine-timolol Place 1 drop into both eyes every 12 (twelve) hours.   dorzolamide 2 % ophthalmic solution Commonly known as: TRUSOPT Place 1 drop into both eyes 2 (two) times daily.   fluorometholone 0.1 %  ophthalmic suspension Commonly known as: FML Place 1 drop into the right eye 2 (two) times daily.   hydroxypropyl methylcellulose / hypromellose 2.5 % ophthalmic solution Commonly known as: ISOPTO TEARS / GONIOVISC Place 1 drop into both eyes 3 (three) times daily as needed for dry eyes.   latanoprost 0.005 % ophthalmic solution Commonly known as: XALATAN Place 1 drop into both eyes at bedtime.   lisinopril 10 MG tablet Commonly known as: ZESTRIL Take one tab in the AM and 2 tabs in the PM What changed:   how to take this  when to take this  additional instructions  Another medication with the same name was removed. Continue taking this medication, and follow the directions you see here.   oxyCODONE 5 MG immediate release tablet Commonly known as: Roxicodone Take 1 tablet (5 mg total) by mouth every 8 (eight) hours as needed for breakthrough pain (only if tylenol not working).   Prolensa 0.07 % Soln Generic drug: Bromfenac Sodium Place 1 drop into the right eye daily.   Rhopressa 0.02 % Soln Generic drug: Netarsudil Dimesylate Place 1 drop into both eyes in the morning and at bedtime.       Follow-up Information    Haddix, Thomasene Lot, MD. Schedule an appointment as soon as possible for a visit in 2 week(s).   Specialty: Orthopedic Surgery Contact information: [place] [address] [phone]        Loman Brooklyn, FNP Follow up in 1 week(s).   Specialty: Family Medicine Contact information: [place] [address] [phone]                Time coordinating discharge: 35 min  Signed:  Geradine Girt DO  [facility] 10/07/2020, 5:44 PM

## 2020-10-06 NOTE — Care Management Obs Status (Signed)
MEDICARE OBSERVATION STATUS NOTIFICATION   Patient Details  Name: KYERA FELAN MRN: [id number] Date of Birth: Jan 02, 1946   Medicare Observation Status Notification Given:  Yes    Kingsley Plan, RN 10/06/2020, 2:15 PM

## 2020-10-06 NOTE — Progress Notes (Signed)
Subjective: 1 Day Post-Op s/p Procedure(s): OPEN REDUCTION INTERNAL FIXATION (ORIF) DISTAL RADIAL FRACTURE  Patient states left wrist is sore, but otherwise no pain.   Objective:  PE: VITALS:   Vitals:   10/05/20 2005 10/06/20 0015 10/06/20 0420 10/06/20 0801  BP: (!) 154/71 (!) 160/88 (!) 143/79 (!) 170/79  Pulse: 81 88 98 91  Resp: 15 17 15 16   Temp: 98.1 F (36.7 C) 98.2 F (36.8 C) 97.7 F (36.5 C) 98 F (36.7 C)  TempSrc: Oral Oral Oral Oral  SpO2: 97% 98% 100% 98%  Weight:      Height:       General: Alert, oriented to person and place, confusion on year states in 1921 but that its turning 1922 soon MSK: RUE in splint. Able to flex and extend all fingers of left hand. Distal sensation intact. Capillary refill intact. Full ROM at left elbow.    LABS  Results for orders placed or performed during the hospital encounter of 10/05/20 (from the past 24 hour(s))  VITAMIN D 25 Hydroxy (Vit-D Deficiency, Fractures)     Status: Abnormal   Collection Time: 10/05/20  6:28 PM  Result Value Ref Range   Vit D, 25-Hydroxy 21.30 (L) 30 - 100 ng/mL  Basic metabolic panel     Status: Abnormal   Collection Time: 10/06/20  1:52 AM  Result Value Ref Range   Sodium 135 135 - 145 mmol/L   Potassium 3.6 3.5 - 5.1 mmol/L   Chloride 98 98 - 111 mmol/L   CO2 26 22 - 32 mmol/L   Glucose, Bld 148 (H) 70 - 99 mg/dL   BUN 5 (L) 8 - 23 mg/dL   Creatinine, Ser 0.43 (L) 0.44 - 1.00 mg/dL   Calcium 8.7 (L) 8.9 - 10.3 mg/dL   GFR, Estimated >60 >60 mL/min   Anion gap 11 5 - 15  Prealbumin     Status: Abnormal   Collection Time: 10/06/20  1:52 AM  Result Value Ref Range   Prealbumin 10.6 (L) 18 - 38 mg/dL  CBC     Status: Abnormal   Collection Time: 10/06/20  1:52 AM  Result Value Ref Range   WBC 11.5 (H) 4.0 - 10.5 K/uL   RBC 3.48 (L) 3.87 - 5.11 MIL/uL   Hemoglobin 9.5 (L) 12.0 - 15.0 g/dL   HCT 30.0 (L) 36.0 - 46.0 %   MCV 86.2 80.0 - 100.0 fL   MCH 27.3 26.0 - 34.0 pg   MCHC  31.7 30.0 - 36.0 g/dL   RDW 13.3 11.5 - 15.5 %   Platelets 482 (H) 150 - 400 K/uL   nRBC 0.0 0.0 - 0.2 %  Magnesium     Status: None   Collection Time: 10/06/20  1:52 AM  Result Value Ref Range   Magnesium 1.9 1.7 - 2.4 mg/dL    DG Wrist Complete Left  Result Date: 10/05/2020 CLINICAL DATA:  The patient suffered a left wrist fracture in a fall 10/05/2020. Status post ORIF. Initial encounter. EXAM: LEFT WRIST - COMPLETE 3+ VIEW COMPARISON:  Plain films left wrist 10/05/2020. FINDINGS: New volar plate and screws are in place for fixation of a distal radius fracture. Hardware is intact. Position and alignment are markedly improved. Ulnar styloid fracture is again seen. Bones are osteopenic. The patient is in a volar splint. IMPRESSION: Marked improvement in position and alignment after fixation of a distal radius fracture. No acute abnormality. Electronically Signed   By: Marcello Moores  Dalessio M.D.   On: 10/05/2020 14:19   DG Wrist Complete Left  Result Date: 10/05/2020 CLINICAL DATA:  ORIF left wrist. EXAM: DG C-ARM 1-60 MIN; LEFT WRIST - COMPLETE 3+ VIEW FLUOROSCOPY TIME:  Fluoroscopy Time:  35 seconds. Radiation: 0.61 mGy. COMPARISON:  Wrist radiographs October 05, 2020. FINDINGS: Five C-arm fluoroscopic images were obtained intraoperatively and submitted for post operative interpretation. These images demonstrate postsurgical changes of plate and screw fixation of a distal left radius fracture. Please see the performing provider's procedural report for further detail. IMPRESSION: Intraoperative fluoroscopic imaging, as detailed above. Electronically Signed   By: Margaretha Sheffield MD   On: 10/05/2020 13:15   DG Wrist Complete Left  Result Date: 10/05/2020 CLINICAL DATA:  Fall, left wrist pain EXAM: LEFT WRIST - COMPLETE 3+ VIEW COMPARISON:  None. FINDINGS: Three view radiograph left wrist demonstrates an acute, mildly comminuted transverse fracture of the distal left radial metaphysis extending  into the distal radioulnar joint. The distal fracture fragment demonstrates 1/2 shaft with dorsal displacement, approximately 40 degrees dorsal angulation, and impaction with resultant mild ulnar positive variance. The transverse fracture plane extends into the distal radioulnar joint. A longitudinal component of the fracture extends into the lunate fossa of the distal radial articular surface. Radiocarpal articulation appears preserved. Radial inclination appears preserved. And associated mildly displaced fracture of the base of the ulnar styloid is identified. There is extensive surrounding soft tissue swelling. Advanced degenerative changes are noted at the base of the thumb at the first carpometacarpal joint. IMPRESSION: Comminuted, impacted, markedly angulated fracture of the distal left radius with intra-articular extension of the fracture plane. Mildly displaced fracture of the base of the ulnar styloid. Electronically Signed   By: Fidela Salisbury MD   On: 10/05/2020 06:40   CT Head Wo Contrast  Result Date: 10/05/2020 CLINICAL DATA:  Fall, left periorbital hematoma EXAM: CT HEAD WITHOUT CONTRAST CT MAXILLOFACIAL WITHOUT CONTRAST CT CERVICAL SPINE WITHOUT CONTRAST TECHNIQUE: Multidetector CT imaging of the head, cervical spine, and maxillofacial structures were performed using the standard protocol without intravenous contrast. Multiplanar CT image reconstructions of the cervical spine and maxillofacial structures were also generated. COMPARISON:  01/10/2016 head CT FINDINGS: Despite efforts by the technologist and patient, motion artifact is present on today's exam and could not be eliminated. This reduces exam sensitivity and specificity. In particular, the facial bones sequence was most severely affected. CT HEAD FINDINGS Brain: The brainstem, cerebellum, cerebral peduncles, thalami, basal ganglia, basilar cisterns, and ventricular system appear within normal limits. No intracranial hemorrhage, mass  lesion, or acute CVA. Vascular: Unremarkable Skull: Unremarkable Other: No supplemental non-categorized findings. CT MAXILLOFACIAL FINDINGS Osseous: Severely degraded images lowering diagnostic sensitivity and specificity. No large or obvious fracture. The head CT images provided better depiction from the maxillary sinuses upward. If there is a high clinical index of suspicion of occult fracture, repeat attempts at scanning should be considered given how degraded these images are. Orbits: Grossly unremarkable Sinuses: Unremarkable Soft tissues: Soft tissues suspected left infraorbital soft tissue swelling. CT CERVICAL SPINE FINDINGS Moderate motion artifact. Alignment: No significant cervical malalignment. Skull base and vertebrae: Small spurring from the anterior arch of C1 is interposed between the basion and the tip of the odontoid. No appreciable fracture or acute bony findings. Soft tissues and spinal canal: Bilateral common carotid atherosclerotic calcification. Disc levels: There is right foraminal stenosis at C5-6 due to facet and uncinate spurring. Upper chest: Biapical pleuroparenchymal scarring. Other: No supplemental non-categorized findings. IMPRESSION: 1. No acute intracranial  findings, discrete facial fracture, or cervical spine fracture/acute subluxation is identified. However, please note that there is severe motion artifact on the maxillofacial images which could obscure subtle fractures. The patient was taken directly to the operating room for left wrist ORIF and accordingly repeat facial CT images could not be immediately obtained, but if there is high clinical suspicion of potential acute facial fracture, we are happy to have the patient return for additional facial imaging data set acquisition and can issue an addendum to this report once those are obtained. 2. Right foraminal impingement at C5-6 due to spurring. 3. Mild carotid atherosclerosis. Electronically Signed   By: Gaylyn Rong  M.D.   On: 10/05/2020 10:14   CT Cervical Spine Wo Contrast  Result Date: 10/05/2020 CLINICAL DATA:  Fall, left periorbital hematoma EXAM: CT HEAD WITHOUT CONTRAST CT MAXILLOFACIAL WITHOUT CONTRAST CT CERVICAL SPINE WITHOUT CONTRAST TECHNIQUE: Multidetector CT imaging of the head, cervical spine, and maxillofacial structures were performed using the standard protocol without intravenous contrast. Multiplanar CT image reconstructions of the cervical spine and maxillofacial structures were also generated. COMPARISON:  01/10/2016 head CT FINDINGS: Despite efforts by the technologist and patient, motion artifact is present on today's exam and could not be eliminated. This reduces exam sensitivity and specificity. In particular, the facial bones sequence was most severely affected. CT HEAD FINDINGS Brain: The brainstem, cerebellum, cerebral peduncles, thalami, basal ganglia, basilar cisterns, and ventricular system appear within normal limits. No intracranial hemorrhage, mass lesion, or acute CVA. Vascular: Unremarkable Skull: Unremarkable Other: No supplemental non-categorized findings. CT MAXILLOFACIAL FINDINGS Osseous: Severely degraded images lowering diagnostic sensitivity and specificity. No large or obvious fracture. The head CT images provided better depiction from the maxillary sinuses upward. If there is a high clinical index of suspicion of occult fracture, repeat attempts at scanning should be considered given how degraded these images are. Orbits: Grossly unremarkable Sinuses: Unremarkable Soft tissues: Soft tissues suspected left infraorbital soft tissue swelling. CT CERVICAL SPINE FINDINGS Moderate motion artifact. Alignment: No significant cervical malalignment. Skull base and vertebrae: Small spurring from the anterior arch of C1 is interposed between the basion and the tip of the odontoid. No appreciable fracture or acute bony findings. Soft tissues and spinal canal: Bilateral common carotid  atherosclerotic calcification. Disc levels: There is right foraminal stenosis at C5-6 due to facet and uncinate spurring. Upper chest: Biapical pleuroparenchymal scarring. Other: No supplemental non-categorized findings. IMPRESSION: 1. No acute intracranial findings, discrete facial fracture, or cervical spine fracture/acute subluxation is identified. However, please note that there is severe motion artifact on the maxillofacial images which could obscure subtle fractures. The patient was taken directly to the operating room for left wrist ORIF and accordingly repeat facial CT images could not be immediately obtained, but if there is high clinical suspicion of potential acute facial fracture, we are happy to have the patient return for additional facial imaging data set acquisition and can issue an addendum to this report once those are obtained. 2. Right foraminal impingement at C5-6 due to spurring. 3. Mild carotid atherosclerosis. Electronically Signed   By: Gaylyn Rong M.D.   On: 10/05/2020 10:14   DG Chest Portable 1 View  Result Date: 10/05/2020 CLINICAL DATA:  Fall EXAM: PORTABLE CHEST 1 VIEW COMPARISON:  08/21/2018 FINDINGS: Lungs are well expanded, symmetric, and clear. No pneumothorax or pleural effusion. Cardiac size within normal limits. Pulmonary vascularity is normal. Osseous structures are age-appropriate. No acute bone abnormality. IMPRESSION: No active disease. Electronically Signed   By: Gloris Ham  Christa See MD   On: 10/05/2020 06:37   DG C-Arm 1-60 Min  Result Date: 10/05/2020 CLINICAL DATA:  ORIF left wrist. EXAM: DG C-ARM 1-60 MIN; LEFT WRIST - COMPLETE 3+ VIEW FLUOROSCOPY TIME:  Fluoroscopy Time:  35 seconds. Radiation: 0.61 mGy. COMPARISON:  Wrist radiographs October 05, 2020. FINDINGS: Five C-arm fluoroscopic images were obtained intraoperatively and submitted for post operative interpretation. These images demonstrate postsurgical changes of plate and screw fixation of a distal  left radius fracture. Please see the performing provider's procedural report for further detail. IMPRESSION: Intraoperative fluoroscopic imaging, as detailed above. Electronically Signed   By: Margaretha Sheffield MD   On: 10/05/2020 13:15   CT Maxillofacial Wo Contrast  Result Date: 10/05/2020 CLINICAL DATA:  Fall, left periorbital hematoma EXAM: CT HEAD WITHOUT CONTRAST CT MAXILLOFACIAL WITHOUT CONTRAST CT CERVICAL SPINE WITHOUT CONTRAST TECHNIQUE: Multidetector CT imaging of the head, cervical spine, and maxillofacial structures were performed using the standard protocol without intravenous contrast. Multiplanar CT image reconstructions of the cervical spine and maxillofacial structures were also generated. COMPARISON:  01/10/2016 head CT FINDINGS: Despite efforts by the technologist and patient, motion artifact is present on today's exam and could not be eliminated. This reduces exam sensitivity and specificity. In particular, the facial bones sequence was most severely affected. CT HEAD FINDINGS Brain: The brainstem, cerebellum, cerebral peduncles, thalami, basal ganglia, basilar cisterns, and ventricular system appear within normal limits. No intracranial hemorrhage, mass lesion, or acute CVA. Vascular: Unremarkable Skull: Unremarkable Other: No supplemental non-categorized findings. CT MAXILLOFACIAL FINDINGS Osseous: Severely degraded images lowering diagnostic sensitivity and specificity. No large or obvious fracture. The head CT images provided better depiction from the maxillary sinuses upward. If there is a high clinical index of suspicion of occult fracture, repeat attempts at scanning should be considered given how degraded these images are. Orbits: Grossly unremarkable Sinuses: Unremarkable Soft tissues: Soft tissues suspected left infraorbital soft tissue swelling. CT CERVICAL SPINE FINDINGS Moderate motion artifact. Alignment: No significant cervical malalignment. Skull base and vertebrae: Small  spurring from the anterior arch of C1 is interposed between the basion and the tip of the odontoid. No appreciable fracture or acute bony findings. Soft tissues and spinal canal: Bilateral common carotid atherosclerotic calcification. Disc levels: There is right foraminal stenosis at C5-6 due to facet and uncinate spurring. Upper chest: Biapical pleuroparenchymal scarring. Other: No supplemental non-categorized findings. IMPRESSION: 1. No acute intracranial findings, discrete facial fracture, or cervical spine fracture/acute subluxation is identified. However, please note that there is severe motion artifact on the maxillofacial images which could obscure subtle fractures. The patient was taken directly to the operating room for left wrist ORIF and accordingly repeat facial CT images could not be immediately obtained, but if there is high clinical suspicion of potential acute facial fracture, we are happy to have the patient return for additional facial imaging data set acquisition and can issue an addendum to this report once those are obtained. 2. Right foraminal impingement at C5-6 due to spurring. 3. Mild carotid atherosclerosis. Electronically Signed   By: Van Clines M.D.   On: 10/05/2020 10:14    Assessment/Plan: Left distal radius fracture: 1 Day Post-Op s/p Procedure(s): OPEN REDUCTION INTERNAL FIXATION (ORIF) DISTAL RADIAL FRACTURE  Weightbearing: NWB LUE, can use sling for comfort Insicional and dressing care: maintain splint VTE prophylaxis: Lovenox while inpatient, nothing needed at discharge from ortho standpoint Pain control: limit narcotics as much as possible Follow - up plan: with Dr. Doreatha Martin in 2 weeks Dispo: pending PT/OT eval,  ok to discharge from orthopedic standpoint when cleared by medicine.   Contact information:   Weekdays 8-5 [address], [place] [phone] A fter hours and holidays please check [email or web address] for group call information for [facility]  Ventura Bruns 10/06/2020, 11:31 AM

## 2020-10-06 NOTE — Care Management CC44 (Signed)
Condition Code 44 Documentation Completed  Patient Details  Name: Mckenzie [name] MRN: [id number] Date of Birth: May 27, 1946   Condition Code 44 given:  Yes Patient signature on Condition Code 44 notice:  Yes Documentation of 2 MD's agreement:  Yes Code 44 added to claim:  Yes    Kingsley Plan, RN 10/06/2020, 2:15 PM

## 2020-10-06 NOTE — Progress Notes (Signed)
Occupational Therapy Discharge Note  Upon arrival, pt resting and supine in bed with husband at bedside. Provided information on occupational therapy's role and the difference between acute PT, acute OT, and outpatient OT. Pt's husband adamant about not wanting to pay for therapy in the hospital as he has acute PT at a prior admission and they "just made me walk in the hallway." Again explained difference between OT and PT.  Again refused therapy. Patient and husband then with questions about ROM restrictions, dressing, and bathing. Provided basic education on ROM exercises at RUE, NWB status and cannot use RW (pt does not usually use RW but has one), dressing techniques, safety with bathing, and safe tub transfer. Patient and husband thankful for information. Will discharge patient from OT services. Please re-consult if family would want an OT evaluation.   Abner Ardis MSOT, OTR/L Acute Rehab Pager: [phone] Office: [phone]

## 2020-10-06 NOTE — Plan of Care (Signed)
Adequate for discharge.

## 2020-10-06 NOTE — Discharge Instructions (Signed)
Diet: As you were doing prior to hospitalization   Shower:  May shower but keep the wounds dry, use an occlusive plastic wrap, NO SOAKING IN TUB.  If the bandage gets wet, change with a clean dry gauze.  If you have a splint on, leave the splint in place and keep the splint dry with a plastic bag.  Dressing:  You may change your dressing 3-5 days after surgery, unless you have a splint.  If you have a splint, then just leave the splint in place and we will change your bandages during your first follow-up appointment.    If you had hand or foot surgery, we will plan to remove your stitches in about 2 weeks in the office.  For all other surgeries, there are sticky tapes (steri-strips) on your wounds and all the stitches are absorbable.  Leave the steri-strips in place when changing your dressings, they will peel off with time, usually 2-3 weeks.  Activity:  Increase activity slowly as tolerated, but follow the weight bearing instructions below.  The rules on driving is that you can not be taking narcotics while you drive, and you must feel in control of the vehicle.    Weight Bearing:   No bearing weight with left arm.   To prevent constipation: you may use a stool softener such as -  Colace (over the counter) 100 mg by mouth twice a day  Drink plenty of fluids (prune juice may be helpful) and high fiber foods Miralax (over the counter) for constipation as needed.    Itching:  If you experience itching with your medications, try taking only a single pain pill, or even half a pain pill at a time.  You can also use benadryl over the counter for itching or also to help with sleep.   Precautions:  If you experience chest pain or shortness of breath - call 911 immediately for transfer to the hospital emergency department!!  If you develop a fever greater that 101 F, purulent drainage from wound, increased redness or drainage from wound, or calf pain -- Call the office at [phone]                                                 Follow- Up Appointment:  Please call for an appointment to be seen in 2 weeks [place] - [phone]

## 2020-10-06 NOTE — Progress Notes (Signed)
PT Cancellation Note  Patient Details Name: IZZABELLA BESSE MRN: [id number] DOB: 01/18/1946   Cancelled Treatment:    Reason Eval/Treat Not Completed: Patient declined, no reason specified. Per OT who entered room, patient's husband refusing all therapy. MD notified. Will re-attempt at later time if necessary. Otherwise will sign off at this time.    Myla Mauriello 10/06/2020, 12:15 PM

## 2020-10-07 ENCOUNTER — Other Ambulatory Visit: Payer: Self-pay | Admitting: Family Medicine

## 2020-10-09 ENCOUNTER — Telehealth: Payer: Self-pay | Admitting: Neurology

## 2020-10-09 ENCOUNTER — Other Ambulatory Visit: Payer: Self-pay | Admitting: Neurology

## 2020-10-09 DIAGNOSIS — M6289 Other specified disorders of muscle: Secondary | ICD-10-CM

## 2020-10-09 MED ORDER — ESCITALOPRAM OXALATE 10 MG PO TABS: 10.0000 mg | ORAL_TABLET | Freq: Every day | ORAL | 3 refills | Status: DC

## 2020-10-09 NOTE — Telephone Encounter (Signed)
Pt's husband(on DPR) is asking for a call with blood work ordered by Dr Epimenio Foot, husband is also asking if Dr Epimenio Foot is still reviewing the results from [facility].  Husband asking to be called on [phone] if not available on initial#

## 2020-10-09 NOTE — Telephone Encounter (Signed)
I spoke to Mckenzie [name]'s daughter.  The lab work we did and the lab work she had at orthopedics (SPEP, UPEP, ANA, ESR, CK, antiacetylcholine receptor antibody, TSH) was normal.  CRP was elevated but with a normal ESR this is unlikely to be significant for her muscle weakness and myalgias  Therefore, I do not have a good explanation for her weakness.  The daughter notes that she is much more depressed.  Depression could certainly be playing some role.  I will send in a prescription for Lexapro.  I also think we need to check an EMG study.

## 2020-10-10 ENCOUNTER — Encounter (HOSPITAL_COMMUNITY): Payer: Self-pay | Admitting: Registered Nurse

## 2020-10-10 ENCOUNTER — Other Ambulatory Visit: Payer: Self-pay

## 2020-10-10 ENCOUNTER — Ambulatory Visit (HOSPITAL_COMMUNITY)
Admission: EM | Admit: 2020-10-10 | Discharge: 2020-10-10 | Disposition: A | Discharge status: Home / Self Care | Payer: Medicare Other | Attending: Registered Nurse | Admitting: Registered Nurse

## 2020-10-10 DIAGNOSIS — F4323 Adjustment disorder with mixed anxiety and depressed mood: Secondary | ICD-10-CM | POA: Diagnosis not present

## 2020-10-10 NOTE — ED Provider Notes (Signed)
[facility]al [facility] Medical Screening Exam  Patient Name: Mckenzie [name] MRN: [id number] Date of Evaluation: 10/10/20 Chief Complaint:   Diagnosis:  Final diagnoses:  Adjustment disorder with mixed anxiety and depressed mood    History of Present illness: Mckenzie [name] is a 75 y.o. female patient presented to [facility] as a walk in accompanied by her husband and daughter with complaints of depression  Mckenzie [name], 75 y.o., female patient seen face to face by this provider, consulted with Dr. Serafina Mitchell; and chart reviewed on 10/10/20.  On evaluation Mckenzie [name] reports depression has worsened during the holidays.  States that there was a recent family death around Christmas and she has also been thinking of her 3 sons that have past away.  Patient also reports other stressors are muscle aches and pains, unable to things that she normally would and doesn't feel like she will be able to baby sit her grandchildren anymore.  Patient states she does have a prior history of an suicide attempt 50 years ago but "I made a promise to my husband that I would never do that again."   During evaluation Mckenzie [name] is sitting up right in wheel chair in no acute distress.  She is alert, oriented x 4, calm and cooperative.  Her mood is appropriate and congruent with affect.  She does not appear to be responding to internal/external stimuli or delusional thoughts.  Patient denies suicidal/self-harm/homicidal ideation, psychosis, and paranoia.  Patient answered question appropriately.  Patient and family agreed that PHP or IOP program would be appropriate for patient . Neurologist started on Lexapro yesterday encouraged to fill prescription and would have a psychiatrist (provider) in program that could manage medications.        Psychiatric Specialty Exam  Presentation  General Appearance:Appropriate for Environment; Casual  Eye Contact:Good  Speech:Clear and Coherent; Normal  Rate  Speech Volume:Normal  Handedness:Right   Mood and Affect  Mood:Anxious; Depressed  Affect:Appropriate; Congruent   Thought Process  Thought Processes:Coherent; Goal Directed  Descriptions of Associations:Intact  Orientation:Full (Time, Place and Person)  Thought Content:WDL; Logical  Hallucinations:None  Ideas of Reference:None  Suicidal Thoughts:No  Homicidal Thoughts:No   Sensorium  Memory:Immediate Good; Recent Good  Judgment:Intact  Insight:Present   Executive Functions  Concentration:Good  Attention Span:Good  [place]  Language:Good   Psychomotor Activity  Psychomotor Activity:Normal   Assets  Assets:Communication Skills; Desire for Improvement; Financial Resources/Insurance; Housing; Leisure Time; Social Support; Transportation   Sleep  Sleep:Fair  Number of hours: No data recorded  Physical Exam: Physical Exam Constitutional:      General: She is not in acute distress.    Appearance: Normal appearance. She is not ill-appearing.  HENT:     Head: Normocephalic and atraumatic.  Eyes:     Pupils: Pupils are equal, round, and reactive to light.  Cardiovascular:     Rate and Rhythm: Normal rate and regular rhythm.  Pulmonary:     Effort: Pulmonary effort is normal.     Breath sounds: Normal breath sounds.  Musculoskeletal:        General: Normal range of motion.     Cervical back: Normal range of motion and neck supple.     Comments: Ace wrap on left wrist/hand   Skin:    General: Skin is warm and dry.     Findings: Bruising (around left eye yellow in color (healing)) present.  Neurological:     Mental Status: She is  alert and oriented to person, place, and time.  Psychiatric:        Attention and Perception: Attention and perception normal. She does not perceive auditory or visual hallucinations.        Mood and Affect: Affect normal. Mood is depressed.        Speech: Speech normal.         Behavior: Behavior normal. Behavior is cooperative.        Thought Content: Thought content normal. Thought content is not paranoid or delusional. Thought content does not include homicidal or suicidal ideation.        Cognition and Memory: Cognition and memory normal.        Judgment: Judgment normal.    Review of Systems  Constitutional: Positive for malaise/fatigue and weight loss.  HENT: Negative.   Eyes: Negative.   Respiratory: Negative.   Cardiovascular: Negative.   Gastrointestinal: Negative.   Genitourinary: Negative.   Musculoskeletal: Positive for joint pain and myalgias.  Skin: Negative.   Neurological: Positive for weakness.  Endo/Heme/Allergies: Negative.   Psychiatric/Behavioral: Positive for depression. Negative for hallucinations, substance abuse and suicidal ideas. Nervous/anxious: Stable. Insomnia: Sleeping more during day and less at night.    Blood pressure (!) 148/74, pulse 95, temperature 97.7 F (36.5 C), temperature source Temporal, resp. rate 18, SpO2 98 %. There is no height or weight on file to calculate BMI.  Musculoskeletal: Strength & Muscle Tone: within normal limits Gait & Station: normal Patient leans: N/A   [facility] MSE Discharge Disposition for Follow up and Recommendations: Based on my evaluation the patient does not appear to have an emergency medical condition and can be discharged with resources and follow up care in outpatient services for Medication Management, Partial Hospitalization Program and Individual Therapy    Follow-up Information    Call  [facility].   Specialty: Behavioral Health Why: If no one has called by 10:00 am tomorrow morning (10/11/20); call the above number and tell you were seen as a walk in and was recommended for the IOP program. Contact information: [address] [place] [address] [phone]              Assunta Found, NP 10/10/2020, 4:55  PM

## 2020-10-10 NOTE — ED Triage Notes (Signed)
Patient had a fall and health is declining since. Patient has not been wanting to get out of bed and do normal activities per family. Patient is showing a increase in depression that has progressed over the past month. Patient denies SI/HI. Patient denies A/V/H. Patient states she is not sleeping at night per family is sleeping lot doing the day but voices discomfort.

## 2020-10-10 NOTE — ED Notes (Signed)
Locker [address] 

## 2020-10-10 NOTE — ED Notes (Signed)
Patient A&O x 4, ambulatory. Patient discharged in no acute distress. Patient denied SI/HI, A/VH upon discharge. Patient verbalized understanding of all discharge instructions explained by staff, to include follow up appointments and safety plan. Pt belongings returned to patient from locker  intact. Patient escorted to lobby via staff for transport to destination via family. Safety maintained.

## 2020-10-11 ENCOUNTER — Telehealth (HOSPITAL_COMMUNITY): Payer: Self-pay | Admitting: Psychiatry

## 2020-10-11 NOTE — Telephone Encounter (Signed)
D:  Placed call to pt (husband answered the phone).  He requested that case manager contact the daughter Cordelia Pen at [phone]).  Placed call to Cordelia Pen (daughter).  She states they were interested in getting an appt with Dr. Tenny Craw and a therapist.  Also, is inquiring about how to get physical therapy set up for her mother.  Ms. Cordelia Pen states she will contact case manager if her mother is interested in a group program.  A:  Provided support and answered questions.  Domingo Dimes, NP.

## 2020-10-11 NOTE — Telephone Encounter (Signed)
D:  Shuvon Rankin, NP referred pt to MH-IOP/PHP.  A:  Placed call to orient patient and discuss both groups to her, but there was no answer and her vm hasn't been set up.  Will attempt again later today.  Inform Shuvon.

## 2020-10-16 ENCOUNTER — Telehealth (HOSPITAL_COMMUNITY): Payer: Self-pay | Admitting: Family Medicine

## 2020-10-16 NOTE — Telephone Encounter (Signed)
Care Management - Follow Up [facility] Discharges   Writer attempted to make contact with patient today and was unsuccessful.  Patient voice mail has not been set up yet.

## 2020-10-18 ENCOUNTER — Telehealth: Payer: Self-pay | Admitting: Neurology

## 2020-10-18 DIAGNOSIS — R404 Transient alteration of awareness: Secondary | ICD-10-CM

## 2020-10-18 NOTE — Telephone Encounter (Signed)
Pt's daughter is asking for a call as to what can be done or suggested about pt's memory worsening memory.  The daughter states pt is seeing spots and the hospital advised she be seen by her neurologist as soon as possible.  Please call.

## 2020-10-18 NOTE — Telephone Encounter (Signed)
I spoke to her daughter.  Mrs. Revelle has had further cognitive decline over the past couple weeks.  She is fluctuating during the day some.  Of note, she had a fall last week of December and fractured her wrist requiring surgery.  Therefore, she has been spending more time in bed.  She is also apathetic.  Additionally she has seemed more depressed even before the fracture.  She started Lexapro a couple days after the fracture.  She has been on it for about 12 days.  Of note, she scored a 25/30 on the [facility] cognitive assessment a few weeks ago.  I took a look at the CT scan that was performed 10/05/2020 and compared it to the CT scan from 01/10/2016.  There are no acute findings but she does have mild atrophy that has progressed over the 4 years.  It is possible that she has mild cognitive impairment and a combination of depression and less activity is causing her to have more cognitive decline.  She has seen psychiatry and will be seeing a counselor soon.  We will set up an EEG to make sure that the fluctuations of her mental status throughout the day are not associated with seizure activity.  They have an appointment to see me next week.

## 2020-10-24 ENCOUNTER — Encounter: Payer: Medicare HMO | Admitting: Neurology

## 2020-10-25 ENCOUNTER — Ambulatory Visit: Payer: Medicare Other | Admitting: Neurology

## 2020-10-25 DIAGNOSIS — R4182 Altered mental status, unspecified: Secondary | ICD-10-CM | POA: Diagnosis not present

## 2020-10-25 DIAGNOSIS — R404 Transient alteration of awareness: Secondary | ICD-10-CM

## 2020-10-26 ENCOUNTER — Telehealth: Payer: Self-pay | Admitting: Neurology

## 2020-10-26 NOTE — Progress Notes (Signed)
   [facility]  EEG (ELECTROENCEPHALOGRAM) REPORT   STUDY DATE: 10/25/2020 PATIENT NAME: Mckenzie [name] DOB: [birth year] MRN: [id number]  ORDERING CLINICIAN: Kaelan Emami A. Felecia Shelling, MD. PhD  TECHNOLOGIST: Nonda Lou, RPSGT TECHNIQUE: Electroencephalogram was recorded utilizing standard 10-20 system of lead placement and reformatted into average and bipolar montages.  RECORDING TIME: 26 minutes 54 seconds  CLINICAL INFORMATION: 75 year old woman with mental status changes  FINDINGS: A digital EEG was performed while the patient was awake and drowsy. While awake and most alert there was a 8 hz posterior dominant rhythm. Voltages and frequencies were symmetric.  There was mild increase slowing with theta activity and some loss of organization.  There were no focal, lateralizing, epileptiform activity or seizures seen.  Photic stimulation had a normal driving response. Hyperventilation and recovery did not change the underlying rhythms. EKG channel shows normal sinus rhythm.   The patient was drowsy at times.  IMPRESSION: This EEG while the patient was awake and drowsy showed mild generalized slowing.  There were no sharp waves or spikes or organized seizure activity.  This pattern is nonspecific but could be seen with metabolic changes, dementia or other causes of mild cerebral dysfunction.   INTERPRETING PHYSICIAN:   Brystol Wasilewski A. Felecia Shelling, MD, PhD, [facility] Certified in Neurology, Clinical Neurophysiology, Sleep Medicine, Pain Medicine and Neuroimaging  [facility] [address] [place], [place] [address] [phone]

## 2020-10-26 NOTE — Telephone Encounter (Signed)
EEG showed mild slowing but no focal slowing or sharp waves/spike.  I discussed the results with her daughter.  She has had a couple more spells but she was anxious with at least one of the spells that could have triggered it.  Overall she is actually doing better now than she did earlier in the week.  We will increase the Lexapro further and the daughter will contact us if she continues to have spells.  If they continue to occur I may want to empirically place her on Keppra.Marland Kitchen

## 2020-10-27 ENCOUNTER — Telehealth (HOSPITAL_COMMUNITY): Payer: Self-pay

## 2020-10-27 NOTE — Telephone Encounter (Signed)
Appointment - Administrative assistant, Ileene Hutchinson and Dr. Modesta Messing were having trouble pulling patient's referral note from pt's recent [facility] visit 10/10/20 and needed pt's discharge referral information for upcoming new pt. appt. 11/01/20.  Faxed over copy of patient's ED Visit Summary from the [facility] for 10/10/20 to the attention of Dr. Modesta Messing to have for first appointment 11/01/20.

## 2020-10-30 ENCOUNTER — Other Ambulatory Visit: Payer: Self-pay | Admitting: Neurology

## 2020-10-30 MED ORDER — ESCITALOPRAM OXALATE 20 MG PO TABS: 10.0000 mg | ORAL_TABLET | Freq: Every day | ORAL | 4 refills | Status: DC
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
## 2020-11-01 ENCOUNTER — Telehealth (INDEPENDENT_AMBULATORY_CARE_PROVIDER_SITE_OTHER): Payer: Medicare Other | Admitting: Psychiatry

## 2020-11-01 ENCOUNTER — Other Ambulatory Visit: Payer: Self-pay

## 2020-11-01 ENCOUNTER — Encounter: Payer: Self-pay | Admitting: Psychiatry

## 2020-11-01 ENCOUNTER — Telehealth: Payer: Self-pay | Admitting: Psychiatry

## 2020-11-01 DIAGNOSIS — F32A Depression, unspecified: Secondary | ICD-10-CM

## 2020-11-01 NOTE — Telephone Encounter (Signed)
Received a call from Ms Gladine Plude, [phone]. She questions why the next appointment was made in a month rather than sooner while the patient is suffering from "severe depression." Discussed the rationale of this appointment being made, and Mckenzie [name] verbalized her understanding. Mckenzie [name] states that [employer] more about the past issues lately. She started to complain dizziness around December. She was sleeping all the time after the death in her family. Mckenzie [name] started to have some confusion at night, which has progressively worsened after she was admitted to the hospital after fall. She had some head tremors several years ago when she was on some medication, which resolved after discontinuation of this medication. Mckenzie [name] started to have head tremors again lately especially when she is stressed.   Discussed with N[place]ole. Taneya wants Mckenzie [name] to be the person of contact if any issues arise. She will sign the consent form and will mail back to [place].

## 2020-11-06 ENCOUNTER — Encounter (INDEPENDENT_AMBULATORY_CARE_PROVIDER_SITE_OTHER): Payer: Medicare HMO | Admitting: Ophthalmology

## 2020-11-07 ENCOUNTER — Encounter: Payer: Medicare Other | Admitting: Neurology

## 2020-11-14 ENCOUNTER — Ambulatory Visit (INDEPENDENT_AMBULATORY_CARE_PROVIDER_SITE_OTHER): Payer: Medicare Other | Admitting: Neurology

## 2020-11-14 ENCOUNTER — Telehealth: Payer: Self-pay | Admitting: Psychiatry

## 2020-11-14 ENCOUNTER — Other Ambulatory Visit: Payer: Self-pay

## 2020-11-14 ENCOUNTER — Encounter: Payer: Medicare Other | Admitting: Neurology

## 2020-11-14 DIAGNOSIS — R441 Visual hallucinations: Secondary | ICD-10-CM | POA: Insufficient documentation

## 2020-11-14 DIAGNOSIS — R269 Unspecified abnormalities of gait and mobility: Secondary | ICD-10-CM | POA: Insufficient documentation

## 2020-11-14 DIAGNOSIS — Z0289 Encounter for other administrative examinations: Secondary | ICD-10-CM

## 2020-11-14 DIAGNOSIS — M5417 Radiculopathy, lumbosacral region: Secondary | ICD-10-CM | POA: Insufficient documentation

## 2020-11-14 DIAGNOSIS — M6289 Other specified disorders of muscle: Secondary | ICD-10-CM | POA: Diagnosis not present

## 2020-11-14 DIAGNOSIS — R4189 Other symptoms and signs involving cognitive functions and awareness: Secondary | ICD-10-CM | POA: Insufficient documentation

## 2020-11-14 MED ORDER — VITAMIN D (ERGOCALCIFEROL) 1.25 MG (50000 UNIT) PO CAPS: 50000.0000 [IU] | ORAL_CAPSULE | ORAL | 1 refills | Status: DC

## 2020-11-14 MED ORDER — ESCITALOPRAM OXALATE 20 MG PO TABS
10.0000 mg | ORAL_TABLET | Freq: Every day | ORAL | 4 refills | Status: DC
Start: 2020-11-14 — End: 2020-11-16

## 2020-11-14 NOTE — Progress Notes (Addendum)
Full Name: Doll Frazee Gender: Female MRN #: [id number] Date of Birth: 1946-07-03    Visit Date: 11/14/2020 08:07 Age: 75 Years Examining Physician: Arlice Colt, MD  Referring Physician: Arlice Colt, MD   History: Ms. Oconnell is a 75 year old woman with several months of muscle weakness.  Initially she had muscle pain but that has improved.  She does not have significant numbness.  She continues to have difficulties with mild weakness in gait.  Exam is detailed in the accompanying note.  Nerve conduction Studies: The left tibial, peroneal, ulnar and median motor responses had normal distal latenices, amplitudes and conduction vlocitis,   Tibial and ulnar F wave latencies had normal latencies.       The left medican, ulnar, sural and superficial peroneal sensory responses had normal peak latencies and amplitudes.  The galvanic sympathetic skin response of the left foot was normal.  Ulnar repetitive stimulation of the left ulnar nerve did not show any decrement before or after exercise.  Electromyogrphy.   Needle EMG of selected muscles of the left arm and leg was performed.  She had mild chronic denervation in the left vastus medialis and left abductor hallucis muscle and a few polyphasic motor units but normal recruitment in 2 other S1 innervated muscles.  Other muscles tested in the left arm and leg were essentially normal.  There was no abnormal spontaneous activity.  Impression: This NCV/EMG study shows the following: 1.   No evidence of polyneuropathy or myopathy. 2.   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  This is insufficient to cause the majority of her symptoms.  Galileo Colello A. Felecia Shelling, MD, PhD, FAAN Certified in Neurology, Clinical Neurophysiology, Sleep Medicine, Pain Medicine and Neuroimaging Director, [place] [facility] [address] [place], [place]  [id number] [phone]  Verbal informed consent was obtained from the patient, patient was informed of potential risk of procedure, including bruising, bleeding, hematoma formation, infection, muscle weakness, muscle pain, numbness, among others.        Nora    Nerve / Sites Muscle Latency Ref. Amplitude Ref. Rel Amp Segments Distance Velocity Ref. Area    ms ms mV mV %  cm m/s m/s mVms  L Median - APB     Wrist APB 3.8 ?4.4 4.4 ?4.0 100 Wrist - APB 7   16.4     Upper arm APB 7.8  4.1  93.4 Upper arm - Wrist 21 51 ?49 15.5  L Ulnar - ADM     Wrist ADM 2.9 ?3.3 6.5 ?6.0 100 Wrist - ADM 7   17.6     B.Elbow ADM 6.1  6.5  100 B.Elbow - Wrist 18 56 ?49 18.1     A.Elbow ADM 8.0  5.8  89.8 A.Elbow - B.Elbow 10 53 ?49 16.4  L Peroneal - EDB     Ankle EDB 4.0 ?6.5 3.5 ?2.0 100 Ankle - EDB 9   12.2     Fib head EDB 10.1  3.1  88.5 Fib head - Ankle 27 44 ?44 11.9     Pop fossa EDB 12.4  3.0  97.7 Pop fossa - Fib head 10 44 ?44 11.6         Pop fossa - Ankle      L Tibial - AH     Ankle AH 3.3 ?5.8 5.9 ?4.0 100 Ankle - AH 9   9.4  Pop fossa AH 11.7  3.9  66.3 Pop fossa - Ankle 35 41 ?41 7.3             SSR    Nerve / Sites Latency   s  L Sympathetic - Foot     Foot 2.84            SNC    Nerve / Sites Rec. Site Peak Lat Ref.  Amp Ref. Segments Distance    ms ms V V  cm  L Sural - Ankle (Calf)     Calf Ankle 3.6 ?4.4 6 ?6 Calf - Ankle 14  L Superficial peroneal - Ankle     Lat leg Ankle 4.1 ?4.4 6 ?6 Lat leg - Ankle 14  L Median - Orthodromic (Dig II, Mid palm)     Dig II Wrist 2.9 ?3.4 10 ?10 Dig II - Wrist 13  L Ulnar - Orthodromic, (Dig V, Mid palm)     Dig V Wrist 3.0 ?3.1 6 ?5 Dig V - Wrist 25             F  Wave    Nerve F Lat Ref.   ms ms  L Tibial - AH 52.9 ?56.0  L Ulnar - ADM 27.7 ?32.0         Rep Stim    Anatomy / Train Rate Ampl. Ampl 4-1 Fac Ampl Area Area 4-1 Fac Area   Hz mV % % mVms % %  L Abductor digiti minimi (manus) - (Ulnar)  Baseline '@1Hz'  1  6.6 0.5 100 14.9 1.9 100  Baseline '@3Hz'  3 6.6 -14.6 99.8 17.9 -23.4 120  Post Exercise '@0' :00 3 6.4 -1.1 97 19.3 -4.2 130  @ 0:30 3 6.3 -1.2 96.4 19.6 -6 132  @ 1:00 3 6.0 -2.7 92.1 19.8 -9.2 133  @ 2:00 3 6.4 -15.2 98.1 19.2 -16.9 129  @ 3:00 3 6.3 -2.3 95.9 19.6 -11.9 131  @ 4:00 3 6.3 -4.2 96.2 17.4 -10.6 117  @ 5:00 3 6.1 -5.8 93.2 18.4 -14.5 123       EMG Summary Table    Spontaneous MUAP Recruitment  Muscle IA Fib PSW Fasc Other Amp Dur. Poly Pattern  L. Deltoid Normal None None None _______ Normal Normal Normal Normal  L. Biceps brachii Normal None None None _______ Normal Normal Normal Normal  L. Triceps brachii Normal None None None _______ Normal Normal 1+ Normal  L. Extensor digitorum communis Normal None None None _______ Normal Normal Normal Normal  L. First dorsal interosseous Normal None None None _______ Normal Normal Normal Normal  L. Vastus medialis Normal None None None _______ Normal Increased 1+ Reduced  L. Peroneus longus Normal None None None _______ Normal Normal 1+ Normal  L. Tibialis anterior Normal None None None _______ Normal Normal Normal Normal  L. Gastrocnemius (Medial head) Normal None None None _______ Normal Normal 1+ Normal  L. Abductor hallucis Normal None None None _______ Normal Normal 1+ Reduced  L. Gluteus medius Normal None None None _______ Normal Normal Normal Normal  L. Iliopsoas Normal None None None _______ Normal Normal Normal Normal            [facility]  PATIENT: SKYLEE BAIRD DOB: August 17, 1946  REFERRING DOCTOR OR PCP:  Dr. Gladstone Lighter ([facility]); Hendricks Limes. FNP (PCP) SOURCE: Patient, notes from Dr. Gladstone Lighter, available lab work  _________________________________   HISTORICAL  CHIEF COMPLAINT:  Chief Complaint  Patient presents with  . Weakness  .  Altered Mental Status    HISTORY OF PRESENT ILLNESS:  Windsor Zirkelbach is a 75 year old woman with myalgias, proximal weakness and cognitive  changes.   Update 11/07/2020: Since the last visit, she continues to have weakness but has done a little bit better.  She fluctuate quite a bit day by day.  On a good day, she is able to walk some steps without assistance but does better with a walker.  Weakness is mostly proximal in the leg more than the hands.  She no longer has myalgias.  Today, EMG/NCV showed a mild chronic L4 and S1 radiculopathy but no myopathy or polyneuropathy.  Laboratory tests performed at orthopedics were essentially normal.  Creatinine kinase was normal.  Acetylcholine receptor antibody was negative.  TSH was normal..  She was hospitalized overnight when she fractured her forearm (actually had bilateral fractures more severe on the right) vitamin D was low at 21.  She also has had difficulties with mood and cognition.  Her depression has been better the last week or 2 since starting Lexapro and especially since increasing the dose from 10 mg to 20 mg.  Apathy is better though she has pretty significant swings between good days and bad days.  At times she is less verbal.  On a good day, she is essentially back to her baseline  History of symptoms:  She was noticing myalgias in October 2021 and felt like she pulled some muscles in her left leg.  She went shopping with her daughter and was standing up for 2 hours.  She began to feel worse in the left leg gave out.  She was very hot at the time and felt the weakness came on over minutes not seconds.    She became unsteady and the left leg gave out on her.    She felt confused after she had fallen while in the store about the time she fell and for the next few minutes but then felt back to baseline afterwards.   There was no LOC.    Since the incident, she notes proximal muscle weakness, especially in her hips and now needs to use her arms to stand up.  She also notes some mild weakness in her neck and shoulders..    She reported more pain in the muscles of the neck, shoulder and  legs/hips since October 2021.      She was prescribed prednisone by Dr. Livia Snellen but did not take it as she was told not to take prednisone by ophthalmology as it might raise the intraocular pressure.     She has ben told not to take NSAIDs due to HTN.    She has also had apathy and apparent depression.  She  has no appetite.  She has lost 2 pounds over the past few months.   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     She has not had no rashes.  Urine is not darker.     She saw Dr. Gladstone Lighter ([facility]) who checked inflammatory labs including ESR, CRP, ANA, uric acid, SPEP/IEF and UPEP.   She was told labs were unremarkable.   At the time of the visit we did not have the actual results of the lab work and they have been requested.I saw her 09/26/2020 and we checked CK AChR Ab and TSH.  LAbs have been normal.      Montreal Cognitive Assessment  09/26/2020  Visuospatial/ Executive (0/5) 3  Naming (0/3)  3  Attention: Read list of digits (0/2) 2  Attention: Read list of letters (0/1) 1  Attention: Serial 7 subtraction starting at 100 (0/3) 3  Language: Repeat phrase (0/2) 1  Language : Fluency (0/1) 1  Abstraction (0/2) 2  Delayed Recall (0/5) 2  Orientation (0/6) 6  Total 24  Adjusted Score (based on education) 25     REVIEW OF SYSTEMS: Constitutional: No fevers, chills, sweats, or change in appetite Eyes: No visual changes, double vision, eye pain Ear, nose and throat: No hearing loss, ear pain, nasal congestion, sore throat Cardiovascular: No chest pain, palpitations Respiratory: No shortness of breath at rest or with exertion.   No wheezes GastrointestinaI: No nausea, vomiting, diarrhea, abdominal pain, fecal incontinence Genitourinary: No dysuria, urinary retention or frequency.  No nocturia. Musculoskeletal: No neck pain, back pain Integumentary: No rash, pruritus, skin lesions Neurological: as above Psychiatric: No depression at this time.  No anxiety Endocrine:  No palpitations, diaphoresis, change in appetite, change in weigh or increased thirst Hematologic/Lymphatic: No anemia, purpura, petechiae. Allergic/Immunologic: No itchy/runny eyes, nasal congestion, recent allergic reactions, rashes  ALLERGIES: Allergies  Allergen Reactions  . Aldomet [Methyldopa] Other (See Comments)    flu  . Fosamax [Alendronate Sodium]   . Acetazolamide Rash  . Buspar [Buspirone] Palpitations    HOME MEDICATIONS:  Current Outpatient Medications:  Marland Kitchen  Vitamin D, Ergocalciferol, (DRISDOL) 1.25 MG (50000 UNIT) CAPS capsule, Take 1 capsule (50,000 Units total) by mouth every 7 (seven) days., Disp: 13 capsule, Rfl: 1 .  amLODipine (NORVASC) 2.5 MG tablet, Take 1 tablet (2.5 mg total) by mouth 2 (two) times daily. (Needs to see new provider), Disp: 180 tablet, Rfl: 3 .  COMBIGAN 0.2-0.5 % ophthalmic solution, Place 1 drop into both eyes every 12 (twelve) hours., Disp: , Rfl:  .  dorzolamide (TRUSOPT) 2 % ophthalmic solution, Place 1 drop into both eyes 2 (two) times daily., Disp: , Rfl:  .  escitalopram (LEXAPRO) 20 MG tablet, Take 0.5 tablets (10 mg total) by mouth daily. 1/2 pill po bid, Disp: 90 tablet, Rfl: 4 .  fluorometholone (FML) 0.1 % ophthalmic suspension, Place 1 drop into the right eye 2 (two) times daily., Disp: , Rfl:  .  hydroxypropyl methylcellulose / hypromellose (ISOPTO TEARS / GONIOVISC) 2.5 % ophthalmic solution, Place 1 drop into both eyes 3 (three) times daily as needed for dry eyes., Disp: , Rfl:  .  latanoprost (XALATAN) 0.005 % ophthalmic solution, Place 1 drop into both eyes at bedtime., Disp: , Rfl:  .  lisinopril (ZESTRIL) 10 MG tablet, Take one tab in the AM and 2 tabs in the PM (Patient taking differently: Take by mouth daily. Take 56m in the AM), Disp: 270 tablet, Rfl: 3 .  Netarsudil Dimesylate (RHOPRESSA) 0.02 % SOLN, Place 1 drop into both eyes in the morning and at bedtime., Disp: , Rfl:  .  oxyCODONE (ROXICODONE) 5 MG immediate release  tablet, Take 1 tablet (5 mg total) by mouth every 8 (eight) hours as needed for breakthrough pain (only if tylenol not working)., Disp: 10 tablet, Rfl: 0 .  PROLENSA 0.07 % SOLN, Place 1 drop into the right eye daily., Disp: , Rfl:   PAST MEDICAL HISTORY: Past Medical History:  Diagnosis Date  . Depression   . High cholesterol   . Hypertension   . Retinal micro-aneurysm of right eye     PAST SURGICAL HISTORY: Past Surgical History:  Procedure Laterality Date  . EYE SURGERY    .  FINGER SURGERY Left    RING FINGER  . OPEN REDUCTION INTERNAL FIXATION (ORIF) DISTAL RADIAL FRACTURE Left 10/05/2020   Procedure: OPEN REDUCTION INTERNAL FIXATION (ORIF) DISTAL RADIAL FRACTURE;  Surgeon: Shona Needles, MD;  Location: [facility];  Service: Orthopedics;  Laterality: Left;  supraclavicular block  . TUBAL LIGATION      FAMILY HISTORY: Family History  Problem Relation Age of Onset  . Hyperlipidemia Mother   . Hypertension Mother   . Diabetes Mother   . COPD Father   . Depression Sister   . Drug abuse Sister   . Suicidality Brother     SOCIAL HISTORY:  Social History   Socioeconomic History  . Marital status: Married    Spouse name: Not on file  . Number of children: 3  . Years of education: 90  . Highest education level: Not on file  Occupational History  . Not on file  Tobacco Use  . Smoking status: Never Smoker  . Smokeless tobacco: Never Used  Vaping Use  . Vaping Use: Never used  Substance and Sexual Activity  . Alcohol use: No  . Drug use: No  . Sexual activity: Not on file  Other Topics Concern  . Not on file  Social History Narrative   Right handed   Caffeine use: coffee daily   Social Determinants of Health   Financial Resource Strain: Not on file  Food Insecurity: Not on file  Transportation Needs: Not on file  Physical Activity: Not on file  Stress: Not on file  Social Connections: Not on file  Intimate Partner Violence: Not on file     PHYSICAL  EXAM  There were no vitals filed for this visit.  There is no height or weight on file to calculate BMI.   General: The patient is well-developed and well-nourished and in no acute distress  HEENT:  Head is [place]/AT.  Sclera are anicteric.    Neck/musculoskeletal: Neck is no longer tender.  Range of motion was normal in the neck.  No longer has tenderness in the proximal muscles.  Skin: Extremities are without rash or  edema.  Neurologic Exam  Mental status: The patient is alert and oriented x 3 at the time of the examination. The patient has apparent normal recent and remote memory, with an apparently normal attention span and concentration ability.   Speech is normal.  Cranial nerves: Extraocular movements are full.  Facial strength and sensation was normal.  Motor: Muscle bulk and tone are normal.  No evidence of cogwheeling or other parkinsonian muscle tone.  No tremor.  Strength was 4+/5 in the iliopsoas muscles, 5/5 in the quadriceps and shoulder and neck extensor muscles.  Strength is 5/5 distally..   Sensory: Sensory testing is intact to pinprick, soft touch and vibration sensation in all 4 extremities.  Coordination: Cerebellar testing reveals good finger-nose-finger and heel-to-shin bilaterally.  Gait and station: Station is normal.   The gait has a reduced stride.  She can turn in 4 steps.  Tandem gait is mildly wide.  Reflexes: Deep tendon reflexes are symmetric and normal bilaterally.        DIAGNOSTIC DATA (LABS, IMAGING, TESTING) - I reviewed patient records, labs, notes, testing and imaging myself where available.  Lab Results  Component Value Date   WBC 11.5 (H) 10/06/2020   HGB 9.5 (L) 10/06/2020   HCT 30.0 (L) 10/06/2020   MCV 86.2 10/06/2020   PLT 482 (H) 10/06/2020      Component Value  Date/Time   NA 135 10/06/2020 0152   NA 144 01/04/2020 0852   K 3.6 10/06/2020 0152   CL 98 10/06/2020 0152   CO2 26 10/06/2020 0152   GLUCOSE 148 (H) 10/06/2020  0152   BUN 5 (L) 10/06/2020 0152   BUN 8 01/04/2020 0852   CREATININE 0.43 (L) 10/06/2020 0152   CREATININE 0.63 01/23/2018 0803   CALCIUM 8.7 (L) 10/06/2020 0152   PROT 7.0 10/05/2020 0622   PROT 6.3 01/04/2020 0852   ALBUMIN 2.4 (L) 10/05/2020 0622   ALBUMIN 4.2 01/04/2020 0852   AST 27 10/05/2020 0622   ALT 29 10/05/2020 0622   ALKPHOS 82 10/05/2020 0622   BILITOT 0.4 10/05/2020 0622   BILITOT 0.4 01/04/2020 0852   GFRNONAA >60 10/06/2020 0152   GFRNONAA 90 01/23/2018 0803   GFRAA 104 01/04/2020 0852   GFRAA 104 01/23/2018 0803   Lab Results  Component Value Date   CHOL 151 01/04/2020   HDL 47 01/04/2020   LDLCALC 87 01/04/2020   TRIG 89 01/04/2020   CHOLHDL 3.2 01/04/2020       ASSESSMENT AND PLAN  Proximal weakness of extremity - Plan: NCV with EMG(electromyography), Ambulatory referral to Physical Therapy  Gait disturbance - Plan: Ambulatory referral to Physical Therapy  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  Cognitive change   1.   Etiology of her muscle weakness is still unclear.  She is improving compared to when I last saw her but is not at baseline.  NCV/EMG study just showed mild L4 and S1 radiculopathy which is not severe enough to explain her symptoms.  I will order some physical therapy. 2.   Etiology of cognitive changes on certain.  It is possible that this is due to her depression as she is improving.  Hopefully this will continue to improve. 3.  Vitamin D was low last month.  I will send in a prescription for high-dose supplement. 4.  She apparently has had some imaging studies from [facility].  I will try to get the results and the images.  We discussed that if gait does not continue to improve we need to check an MRI of the brain and cervical spine.   Return in 6 weeks or sooner for new or worsening neurologic symptoms.  Letonia Stead A. Felecia Shelling, MD, [facility] 01/11/5680, 2:75 PM Certified in Neurology, Clinical Neurophysiology, Sleep Medicine and  Neuroimaging  [facility] [address], [place], [place] [address] [phone]

## 2020-11-15 ENCOUNTER — Telehealth: Payer: Self-pay | Admitting: Psychiatry

## 2020-11-15 ENCOUNTER — Other Ambulatory Visit: Payer: Self-pay | Admitting: Neurology

## 2020-11-16 ENCOUNTER — Telehealth: Payer: Self-pay | Admitting: Psychiatry

## 2020-11-20 ENCOUNTER — Encounter: Payer: Self-pay | Admitting: Family Medicine

## 2020-11-20 ENCOUNTER — Other Ambulatory Visit (INDEPENDENT_AMBULATORY_CARE_PROVIDER_SITE_OTHER): Payer: Medicare Other | Admitting: Nurse Practitioner

## 2020-11-20 DIAGNOSIS — I313 Pericardial effusion (noninflammatory): Secondary | ICD-10-CM

## 2020-11-20 DIAGNOSIS — R7982 Elevated C-reactive protein (CRP): Secondary | ICD-10-CM

## 2020-11-20 DIAGNOSIS — R531 Weakness: Secondary | ICD-10-CM

## 2020-11-20 DIAGNOSIS — R700 Elevated erythrocyte sedimentation rate: Secondary | ICD-10-CM

## 2020-11-20 DIAGNOSIS — I3139 Other pericardial effusion (noninflammatory): Secondary | ICD-10-CM

## 2020-11-20 DIAGNOSIS — D75839 Thrombocytosis, unspecified: Secondary | ICD-10-CM

## 2020-11-20 DIAGNOSIS — J90 Pleural effusion, not elsewhere classified: Secondary | ICD-10-CM

## 2020-11-20 DIAGNOSIS — D638 Anemia in other chronic diseases classified elsewhere: Secondary | ICD-10-CM

## 2020-11-20 DIAGNOSIS — E871 Hypo-osmolality and hyponatremia: Secondary | ICD-10-CM

## 2020-11-20 DIAGNOSIS — R7989 Other specified abnormal findings of blood chemistry: Secondary | ICD-10-CM

## 2020-11-20 NOTE — Progress Notes (Signed)
   Virtual Visit via telephone Note Due to COVID-19 pandemic this visit was conducted virtually. This visit type was conducted due to national recommendations for restrictions regarding the COVID-19 Pandemic (e.g. social distancing, sheltering in place) in an effort to limit this patient's exposure and mitigate transmission in our community. All issues noted in this document were discussed and addressed.  A physical exam was not performed with this format.  I connected with Mckenzie [name] on 11/20/20 at 06:25 by telephone and verified that I am speaking with the correct person using two identifiers. Mckenzie [name] is currently located at home during visit. The provider, Ivy Lynn, NP is located in their office at time of visit.  I discussed the limitations, risks, security and privacy concerns of performing an evaluation and management service by telephone and the availability of in person appointments. I also discussed with the patient that there may be a patient responsible charge related to this service. The patient expressed understanding and agreed to proceed.   History and Present Illness:  HPI   Received a phone call from Mckenzie [name] on call service at 5:40 PM to report that patient is experiencing increased weakness and hypotension with a blood pressure of 90/46, standing 84/52.  Family wants to know if to continue giving patient blood pressure medication.. Repeat blood pressure after 1 hour, and given hydration sitting 121/59, standing 102/55.    Review of Systems  Constitutional: Negative for chills and fever.  HENT: Negative.   Respiratory: Negative.   Skin: Negative.   Neurological: Positive for dizziness and weakness.       Hypotension  All other systems reviewed and are negative.    Observations/Objective: Tele-vist  Assessment and Plan:  Advised patient's daughter Mckenzie [name] to push hydration, hold all blood pressure medication, and follow-up in the a.m. with PCP.   If symptoms worsens or unresolved please call back.  Current blood pressure medication amlodipine 2.5 mg tablet 1 tablet twice daily, lisinopril 10 mg tablet, patient is taking 1 tablet in the a.m. and 2 tablets in the p.m.   Follow Up Instructions: Follow up in the AM with PCP for further evaluation    I discussed the assessment and treatment plan with the patient. The patient was provided an opportunity to ask questions and all were answered. The patient agreed with the plan and demonstrated an understanding of the instructions.   The patient was advised to call back or seek an in-person evaluation if the symptoms worsen or if the condition fails to improve as anticipated.  The above assessment and management plan was discussed with the patient. The patient verbalized understanding of and has agreed to the management plan. Patient is aware to call the clinic if symptoms persist or worsen. Patient is aware when to return to the clinic for a follow-up visit. Patient educated on when it is appropriate to go to the emergency department.   Time call ended:  06:30 pm   I provided 5 minutes of non-face-to-face time during this encounter.    Ivy Lynn, NP

## 2020-11-21 ENCOUNTER — Telehealth: Payer: Self-pay | Admitting: *Deleted

## 2020-11-21 ENCOUNTER — Other Ambulatory Visit: Payer: Self-pay

## 2020-11-21 ENCOUNTER — Encounter: Payer: Self-pay | Admitting: Family Medicine

## 2020-11-21 ENCOUNTER — Ambulatory Visit (INDEPENDENT_AMBULATORY_CARE_PROVIDER_SITE_OTHER): Payer: Medicare Other | Admitting: Family Medicine

## 2020-11-21 VITALS — BP 129/63 | HR 87 | Temp 98.3°F | Ht 64.0 in | Wt 102.6 lb

## 2020-11-21 DIAGNOSIS — I10 Essential (primary) hypertension: Secondary | ICD-10-CM

## 2020-11-21 DIAGNOSIS — R410 Disorientation, unspecified: Secondary | ICD-10-CM | POA: Diagnosis not present

## 2020-11-21 DIAGNOSIS — R7301 Impaired fasting glucose: Secondary | ICD-10-CM

## 2020-11-21 DIAGNOSIS — R531 Weakness: Secondary | ICD-10-CM | POA: Diagnosis not present

## 2020-11-21 DIAGNOSIS — D649 Anemia, unspecified: Secondary | ICD-10-CM

## 2020-11-21 DIAGNOSIS — R42 Dizziness and giddiness: Secondary | ICD-10-CM

## 2020-11-21 LAB — URINALYSIS, COMPLETE
Bilirubin, UA: NEGATIVE
Glucose, UA: NEGATIVE
Ketones, UA: NEGATIVE
Leukocytes,UA: NEGATIVE
Nitrite, UA: NEGATIVE
Specific Gravity, UA: >1.03 — ABNORMAL HIGH (ref 1.005–1.030)
Urobilinogen, Ur: 0.2 mg/dL (ref 0.2–1.0)
pH, UA: 5.5 (ref 5.0–7.5)

## 2020-11-21 LAB — MICROSCOPIC EXAMINATION

## 2020-11-21 LAB — BAYER DCA HB A1C WAIVED: HB A1C (BAYER DCA - WAIVED): 5.5 % (ref <7.0)

## 2020-11-21 NOTE — Telephone Encounter (Signed)
R/c cd and report from [facility] cd and report on Emma desk

## 2020-11-21 NOTE — Progress Notes (Signed)
Assessment & Plan:  1. Primary hypertension Hold all blood pressure medications.  Advised that they should keep a check on her blood pressure, but not so many times a day.  Advised that they only check her blood pressure once a day and keep a log.  They are to let me know if her blood pressure starts coming back up and is consistently greater than 150/90. - CMP14+EGFR  2. Confusion - Urinalysis, Complete - Urine Culture  3. Weakness - Urinalysis, Complete - Urine Culture  4. Dizziness Holding blood pressure medications. - Anemia Profile B  5. Impaired fasting glucose - Bayer DCA Hb A1c Waived  6. Anemia, unspecified type - Anemia Profile B   Follow up plan: Return as directed after labs result.  Hendricks Limes, MSN, APRN, FNP-C [facility]  Subjective:   [name], female    DOB: 1946/06/15, 75 y.o.   MRN: [id number]  HPI: Mckenzie [name] is a 75 y.o. female presenting on 11/21/2020 for low BP (First noticed on Sunday and D/C'd medication yesterday), Dizziness (X 1.5 months ), Fatigue (X 1.5 months), and Altered Mental Status (X 1.5 months )  Patient is accompanied by her daughter and her husband who she is okay with being present.  Patient's daughter reports she has been confused, weak, dizzy, and fatigued since she came home from the hospital at the end of December.  The physical therapist came out to the home on Sunday and advised that her blood pressure was very low and that they should monitor it.  Patient's daughter has kept a log of her blood pressure yesterday and this morning.  Throughout the day yesterday she checked her blood pressure 7 times.  Her systolic BP ranged 70-263; her diastolic ranged 78-58.  She did not take her blood pressure medication in the evening after discussing with on-call provider.  She has not had her blood pressure medicine yet today either and her systolic this morning was 85-027; her diastolic was  74-12.  Patient has been unable to toilet or shower without assistance.  She is using a walker and the family still feels like they need to be near her with it as she is so unsteady.  She has been seeing a neurologist who has ordered physical therapy.  Her urine when she was in the hospital at the end of December was positive for leukocytes and bacteria.  She has not been on any antibiotics to treat possible infection.  Her hemoglobin at the time of admission was 9.5.   ROS: Negative unless specifically indicated above in HPI.   Relevant past medical history reviewed and updated as indicated.   Allergies and medications reviewed and updated.   Current Outpatient Medications:  .  dorzolamide (TRUSOPT) 2 % ophthalmic solution, Place 1 drop into both eyes 2 (two) times daily., Disp: , Rfl:  .  escitalopram (LEXAPRO) 20 MG tablet, TAKE 1/2 (ONE-HALF) TABLET BY MOUTH TWICE DAILY (Patient taking differently: Take 20 mg by mouth daily.), Disp: 90 tablet, Rfl: 4 .  fluorometholone (FML) 0.1 % ophthalmic suspension, Place 1 drop into the right eye 2 (two) times daily., Disp: , Rfl:  .  hydroxypropyl methylcellulose / hypromellose (ISOPTO TEARS / GONIOVISC) 2.5 % ophthalmic solution, Place 1 drop into both eyes 3 (three) times daily as needed for dry eyes., Disp: , Rfl:  .  latanoprost (XALATAN) 0.005 % ophthalmic solution, Place 1 drop into both eyes at bedtime., Disp: , Rfl:  .  Netarsudil Dimesylate (RHOPRESSA) 0.02 % SOLN, Place 1 drop into both eyes in the morning and at bedtime., Disp: , Rfl:  .  PROLENSA 0.07 % SOLN, Place 1 drop into the right eye daily., Disp: , Rfl:  .  Vitamin D, Ergocalciferol, (DRISDOL) 1.25 MG (50000 UNIT) CAPS capsule, Take 1 capsule (50,000 Units total) by mouth every 7 (seven) days., Disp: 13 capsule, Rfl: 1 .  COMBIGAN 0.2-0.5 % ophthalmic solution, Place 1 drop into both eyes every 12 (twelve) hours., Disp: , Rfl:   Allergies  Allergen Reactions  . Aldomet  [Methyldopa] Other (See Comments)    flu  . Fosamax [Alendronate Sodium]   . Acetazolamide Rash  . Buspar [Buspirone] Palpitations    Objective:   BP 129/63   Pulse 87   Temp 98.3 F (36.8 C) (Temporal)   Ht '5\' 4"'  (1.626 m)   Wt 102 lb 9.6 oz (46.5 kg)   SpO2 97%   BMI 17.61 kg/m    Physical Exam Vitals reviewed.  Constitutional:      General: She is not in acute distress.    Appearance: Normal appearance. She is underweight. She is not ill-appearing, toxic-appearing or diaphoretic.  HENT:     Head: Normocephalic and atraumatic.  Eyes:     General: No scleral icterus.       Right eye: No discharge.        Left eye: No discharge.     Conjunctiva/sclera: Conjunctivae normal.  Cardiovascular:     Rate and Rhythm: Normal rate and regular rhythm.     Heart sounds: Normal heart sounds. No murmur heard. No friction rub. No gallop.   Pulmonary:     Effort: Pulmonary effort is normal. No respiratory distress.     Breath sounds: Normal breath sounds. No stridor. No wheezing, rhonchi or rales.  Musculoskeletal:        General: Normal range of motion.     Cervical back: Normal range of motion.  Skin:    General: Skin is warm and dry.     Capillary Refill: Capillary refill takes less than 2 seconds.  Neurological:     General: No focal deficit present.     Mental Status: She is alert and oriented to person, place, and time. Mental status is at baseline.  Psychiatric:        Mood and Affect: Mood normal.        Behavior: Behavior normal.        Thought Content: Thought content normal.        Judgment: Judgment normal.

## 2020-11-22 ENCOUNTER — Other Ambulatory Visit: Payer: Self-pay | Admitting: Family Medicine

## 2020-11-22 DIAGNOSIS — R748 Abnormal levels of other serum enzymes: Secondary | ICD-10-CM

## 2020-11-22 DIAGNOSIS — E871 Hypo-osmolality and hyponatremia: Secondary | ICD-10-CM

## 2020-11-22 LAB — CMP14+EGFR
ALT: 45 IU/L — ABNORMAL HIGH (ref 0–32)
AST: 25 IU/L (ref 0–40)
Albumin/Globulin Ratio: 0.6 — ABNORMAL LOW (ref 1.2–2.2)
Albumin: 2.8 g/dL — ABNORMAL LOW (ref 3.7–4.7)
Alkaline Phosphatase: 152 IU/L — ABNORMAL HIGH (ref 44–121)
BUN/Creatinine Ratio: 41 — ABNORMAL HIGH (ref 12–28)
BUN: 15 mg/dL (ref 8–27)
Bilirubin Total: <0.2 mg/dL (ref 0.0–1.2)
CO2: 23 mmol/L (ref 20–29)
Calcium: 9.1 mg/dL (ref 8.7–10.3)
Chloride: 92 mmol/L — ABNORMAL LOW (ref 96–106)
Creatinine, Ser: 0.37 mg/dL — ABNORMAL LOW (ref 0.57–1.00)
GFR calc Af Amer: 121 mL/min/{1.73_m2} (ref >59)
GFR calc non Af Amer: 105 mL/min/{1.73_m2} (ref >59)
Globulin, Total: 4.5 g/dL (ref 1.5–4.5)
Glucose: 117 mg/dL — ABNORMAL HIGH (ref 65–99)
Potassium: 4.9 mmol/L (ref 3.5–5.2)
Sodium: 131 mmol/L — ABNORMAL LOW (ref 134–144)
Total Protein: 7.3 g/dL (ref 6.0–8.5)

## 2020-11-22 LAB — ANEMIA PROFILE B
Basophils Absolute: 0 10*3/uL (ref 0.0–0.2)
Basos: 0 %
EOS (ABSOLUTE): 0.2 10*3/uL (ref 0.0–0.4)
Eos: 1 %
Ferritin: 540 ng/mL — ABNORMAL HIGH (ref 15–150)
Folate: 10.7 ng/mL (ref >3.0)
Hematocrit: 28.9 % — ABNORMAL LOW (ref 34.0–46.6)
Hemoglobin: 9 g/dL — ABNORMAL LOW (ref 11.1–15.9)
Immature Grans (Abs): 0 10*3/uL (ref 0.0–0.1)
Immature Granulocytes: 0 %
Iron Saturation: 6 % — CL (ref 15–55)
Iron: 12 ug/dL — ABNORMAL LOW (ref 27–139)
Lymphocytes Absolute: 1.8 10*3/uL (ref 0.7–3.1)
Lymphs: 15 %
MCH: 25.2 pg — ABNORMAL LOW (ref 26.6–33.0)
MCHC: 31.1 g/dL — ABNORMAL LOW (ref 31.5–35.7)
MCV: 81 fL (ref 79–97)
Monocytes Absolute: 1.1 10*3/uL — ABNORMAL HIGH (ref 0.1–0.9)
Monocytes: 9 %
Neutrophils Absolute: 8.5 10*3/uL — ABNORMAL HIGH (ref 1.4–7.0)
Neutrophils: 75 %
Platelets: 670 10*3/uL — ABNORMAL HIGH (ref 150–450)
RBC: 3.57 x10E6/uL — ABNORMAL LOW (ref 3.77–5.28)
RDW: 13.7 % (ref 11.7–15.4)
Retic Ct Pct: 1.5 % (ref 0.6–2.6)
Total Iron Binding Capacity: 198 ug/dL — ABNORMAL LOW (ref 250–450)
UIBC: 186 ug/dL (ref 118–369)
Vitamin B-12: 309 pg/mL (ref 232–1245)
WBC: 11.5 10*3/uL — ABNORMAL HIGH (ref 3.4–10.8)

## 2020-11-23 LAB — URINE CULTURE

## 2020-11-24 ENCOUNTER — Other Ambulatory Visit: Payer: Self-pay

## 2020-11-24 ENCOUNTER — Other Ambulatory Visit: Payer: Medicare Other

## 2020-11-24 DIAGNOSIS — Z1211 Encounter for screening for malignant neoplasm of colon: Secondary | ICD-10-CM

## 2020-11-24 NOTE — Addendum Note (Signed)
Addended by: Liliane Bade on: 11/24/2020 10:38 AM   Modules accepted: Orders

## 2020-11-25 LAB — FECAL OCCULT BLOOD, IMMUNOCHEMICAL: Fecal Occult Bld: NEGATIVE

## 2020-11-27 ENCOUNTER — Telehealth: Payer: Self-pay | Admitting: Neurology

## 2020-11-27 NOTE — Telephone Encounter (Signed)
I reviewed the MRI of the lumbar spine performed at [facility] 10/04/2020.  It shows multilevel degenerative changes.    At L2-L3, there is advanced facet hypertrophy with ligamenta flava hypertrophy and minimal retrolisthesis.  There is moderate spinal stenosis.  Mild to moderate foraminal narrowing and moderate lateral recess stenosis.  No definite nerve root compression.  At L3-L4, there is advanced facet hypertrophy, ligamenta flava hypertrophy, disc protrusion all combining to cause moderate spinal stenosis, mild to moderate foraminal narrowing, moderately severe lateral recess stenosis with potential for L4 nerve root compression.  At L4-L5, there is 4 mm anterolisthesis, facet hypertrophy.  Mild foraminal narrowing and moderate left and moderately severe right lateral recess stenosis.  Possible right L5 nerve root compression.  L5-S1 shows facet hypertrophy but no nerve root compression.   It is possible that the spinal stenosis is contributing to her symptoms and if she does not continue to improve surgery should be considered.

## 2020-11-29 ENCOUNTER — Other Ambulatory Visit: Payer: Self-pay

## 2020-11-29 ENCOUNTER — Other Ambulatory Visit: Payer: Medicare Other

## 2020-11-29 DIAGNOSIS — E871 Hypo-osmolality and hyponatremia: Secondary | ICD-10-CM

## 2020-11-29 DIAGNOSIS — R748 Abnormal levels of other serum enzymes: Secondary | ICD-10-CM

## 2020-11-30 ENCOUNTER — Other Ambulatory Visit: Payer: Self-pay | Admitting: Family Medicine

## 2020-11-30 ENCOUNTER — Ambulatory Visit (HOSPITAL_COMMUNITY)
Admission: RE | Admit: 2020-11-30 | Discharge: 2020-11-30 | Disposition: A | Discharge status: Home / Self Care | Payer: Medicare Other | Source: Ambulatory Visit | Attending: Family Medicine | Admitting: Family Medicine

## 2020-11-30 ENCOUNTER — Telehealth: Payer: Self-pay

## 2020-11-30 DIAGNOSIS — R410 Disorientation, unspecified: Secondary | ICD-10-CM

## 2020-11-30 DIAGNOSIS — R1084 Generalized abdominal pain: Secondary | ICD-10-CM | POA: Insufficient documentation

## 2020-11-30 DIAGNOSIS — K5900 Constipation, unspecified: Secondary | ICD-10-CM

## 2020-11-30 DIAGNOSIS — R748 Abnormal levels of other serum enzymes: Secondary | ICD-10-CM

## 2020-11-30 DIAGNOSIS — E871 Hypo-osmolality and hyponatremia: Secondary | ICD-10-CM

## 2020-11-30 LAB — CMP14+EGFR
ALT: 51 IU/L — ABNORMAL HIGH (ref 0–32)
AST: 32 IU/L (ref 0–40)
Albumin/Globulin Ratio: 0.8 — ABNORMAL LOW (ref 1.2–2.2)
Albumin: 3 g/dL — ABNORMAL LOW (ref 3.7–4.7)
Alkaline Phosphatase: 190 IU/L — ABNORMAL HIGH (ref 44–121)
BUN/Creatinine Ratio: 21 (ref 12–28)
BUN: 8 mg/dL (ref 8–27)
Bilirubin Total: 0.2 mg/dL (ref 0.0–1.2)
CO2: 26 mmol/L (ref 20–29)
Calcium: 9 mg/dL (ref 8.7–10.3)
Chloride: 91 mmol/L — ABNORMAL LOW (ref 96–106)
Creatinine, Ser: 0.38 mg/dL — ABNORMAL LOW (ref 0.57–1.00)
GFR calc Af Amer: 120 mL/min/{1.73_m2} (ref >59)
GFR calc non Af Amer: 104 mL/min/{1.73_m2} (ref >59)
Globulin, Total: 3.9 g/dL (ref 1.5–4.5)
Glucose: 100 mg/dL — ABNORMAL HIGH (ref 65–99)
Potassium: 4.8 mmol/L (ref 3.5–5.2)
Sodium: 129 mmol/L — ABNORMAL LOW (ref 134–144)
Total Protein: 6.9 g/dL (ref 6.0–8.5)

## 2020-11-30 MED ORDER — IOHEXOL 300 MG/ML  SOLN
75.0000 mL | Freq: Once | INTRAMUSCULAR | Status: AC | PRN
  Administered 2020-11-30: 75 mL via INTRAVENOUS

## 2020-11-30 MED ORDER — IOHEXOL 9 MG/ML PO SOLN
ORAL | Status: AC
  Filled 2020-11-30: qty 1000

## 2020-11-30 NOTE — Progress Notes (Signed)
Attempted to call CT report to Mckenzie [name] using the number provided. There was no answer. Called office and had after hours staff page on call physician but never heard back.

## 2020-11-30 NOTE — Telephone Encounter (Signed)
A referral was made today and they would like to go to [facility] and not [facility].

## 2020-11-30 NOTE — Telephone Encounter (Signed)
Pts daughter called to let Karsten Fells know that they have decided they do not want pt sent to [facility]. They want her sent to [facility].

## 2020-11-30 NOTE — Telephone Encounter (Signed)
Pts daughter called back stating that she just realized that someone already scheduled pt to have CT scan done at [facility] today so they are ok with just going there..  The CT scan has been ordered with contrast but pt does not have the contrast and daughter and pt was unaware of this appt until just now..  Daughter needs to know what they need to do or who to contact?

## 2020-11-30 NOTE — Telephone Encounter (Signed)
DISREGARD TELEPHONE MESSAGE.  There was a miscommunication between pts husband and pts daughter.  Daughter is taking pt to her appt at [facility] now. (aware that pt needs to be there by 3:30)

## 2020-12-01 ENCOUNTER — Other Ambulatory Visit: Payer: Self-pay | Admitting: Family Medicine

## 2020-12-01 ENCOUNTER — Other Ambulatory Visit: Payer: Medicare Other

## 2020-12-01 ENCOUNTER — Other Ambulatory Visit: Payer: Self-pay

## 2020-12-01 DIAGNOSIS — J90 Pleural effusion, not elsewhere classified: Secondary | ICD-10-CM

## 2020-12-01 DIAGNOSIS — R748 Abnormal levels of other serum enzymes: Secondary | ICD-10-CM

## 2020-12-01 DIAGNOSIS — I313 Pericardial effusion (noninflammatory): Secondary | ICD-10-CM

## 2020-12-01 DIAGNOSIS — I3139 Other pericardial effusion (noninflammatory): Secondary | ICD-10-CM

## 2020-12-01 DIAGNOSIS — D509 Iron deficiency anemia, unspecified: Secondary | ICD-10-CM

## 2020-12-01 DIAGNOSIS — R710 Precipitous drop in hematocrit: Secondary | ICD-10-CM

## 2020-12-01 DIAGNOSIS — Z1211 Encounter for screening for malignant neoplasm of colon: Secondary | ICD-10-CM

## 2020-12-02 LAB — ANEMIA PROFILE B
Basophils Absolute: 0 10*3/uL (ref 0.0–0.2)
Basos: 0 %
EOS (ABSOLUTE): 0.2 10*3/uL (ref 0.0–0.4)
Eos: 2 %
Ferritin: 553 ng/mL — ABNORMAL HIGH (ref 15–150)
Folate: 14.6 ng/mL (ref >3.0)
Hematocrit: 30.4 % — ABNORMAL LOW (ref 34.0–46.6)
Hemoglobin: 9.6 g/dL — ABNORMAL LOW (ref 11.1–15.9)
Immature Grans (Abs): 0.1 10*3/uL (ref 0.0–0.1)
Immature Granulocytes: 1 %
Iron Saturation: 9 % — CL (ref 15–55)
Iron: 18 ug/dL — ABNORMAL LOW (ref 27–139)
Lymphocytes Absolute: 1.2 10*3/uL (ref 0.7–3.1)
Lymphs: 10 %
MCH: 24.6 pg — ABNORMAL LOW (ref 26.6–33.0)
MCHC: 31.6 g/dL (ref 31.5–35.7)
MCV: 78 fL — ABNORMAL LOW (ref 79–97)
Monocytes Absolute: 1.1 10*3/uL — ABNORMAL HIGH (ref 0.1–0.9)
Monocytes: 9 %
Neutrophils Absolute: 9.4 10*3/uL — ABNORMAL HIGH (ref 1.4–7.0)
Neutrophils: 78 %
Platelets: 738 10*3/uL — ABNORMAL HIGH (ref 150–450)
RBC: 3.91 x10E6/uL (ref 3.77–5.28)
RDW: 14.1 % (ref 11.7–15.4)
Retic Ct Pct: 1.8 % (ref 0.6–2.6)
Total Iron Binding Capacity: 198 ug/dL — ABNORMAL LOW (ref 250–450)
UIBC: 180 ug/dL (ref 118–369)
Vitamin B-12: 406 pg/mL (ref 232–1245)
WBC: 12 10*3/uL — ABNORMAL HIGH (ref 3.4–10.8)

## 2020-12-02 LAB — CMP14+EGFR
ALT: 52 IU/L — ABNORMAL HIGH (ref 0–32)
AST: 33 IU/L (ref 0–40)
Albumin/Globulin Ratio: 0.6 — ABNORMAL LOW (ref 1.2–2.2)
Albumin: 2.8 g/dL — ABNORMAL LOW (ref 3.7–4.7)
Alkaline Phosphatase: 177 IU/L — ABNORMAL HIGH (ref 44–121)
BUN/Creatinine Ratio: 21 (ref 12–28)
BUN: 8 mg/dL (ref 8–27)
Bilirubin Total: 0.2 mg/dL (ref 0.0–1.2)
CO2: 22 mmol/L (ref 20–29)
Calcium: 9.1 mg/dL (ref 8.7–10.3)
Chloride: 92 mmol/L — ABNORMAL LOW (ref 96–106)
Creatinine, Ser: 0.38 mg/dL — ABNORMAL LOW (ref 0.57–1.00)
GFR calc Af Amer: 120 mL/min/{1.73_m2} (ref >59)
GFR calc non Af Amer: 104 mL/min/{1.73_m2} (ref >59)
Globulin, Total: 4.4 g/dL (ref 1.5–4.5)
Glucose: 124 mg/dL — ABNORMAL HIGH (ref 65–99)
Potassium: 4.9 mmol/L (ref 3.5–5.2)
Sodium: 129 mmol/L — ABNORMAL LOW (ref 134–144)
Total Protein: 7.2 g/dL (ref 6.0–8.5)

## 2020-12-02 LAB — BRAIN NATRIURETIC PEPTIDE: BNP: 213.7 pg/mL — ABNORMAL HIGH (ref 0.0–100.0)

## 2020-12-04 ENCOUNTER — Encounter (INDEPENDENT_AMBULATORY_CARE_PROVIDER_SITE_OTHER): Payer: Medicare Other | Admitting: Ophthalmology

## 2020-12-05 NOTE — Telephone Encounter (Signed)
I have spoken with daughter [name] on the phone and answered all of her questions. Patient has an appointment with GI tomorrow. They are agreeable to a referral to cardiology with [facility] in [place].

## 2020-12-06 ENCOUNTER — Other Ambulatory Visit: Payer: Self-pay

## 2020-12-06 ENCOUNTER — Ambulatory Visit: Payer: Medicare Other | Admitting: Gastroenterology

## 2020-12-06 ENCOUNTER — Encounter: Payer: Self-pay | Admitting: Gastroenterology

## 2020-12-06 ENCOUNTER — Telehealth: Payer: Self-pay

## 2020-12-06 VITALS — BP 122/68 | HR 81 | Temp 97.3°F | Ht 65.0 in | Wt 105.0 lb

## 2020-12-06 DIAGNOSIS — D509 Iron deficiency anemia, unspecified: Secondary | ICD-10-CM

## 2020-12-06 DIAGNOSIS — K5900 Constipation, unspecified: Secondary | ICD-10-CM | POA: Diagnosis not present

## 2020-12-06 DIAGNOSIS — D638 Anemia in other chronic diseases classified elsewhere: Secondary | ICD-10-CM | POA: Insufficient documentation

## 2020-12-06 DIAGNOSIS — K921 Melena: Secondary | ICD-10-CM | POA: Insufficient documentation

## 2020-12-06 MED ORDER — CLENPIQ 10-3.5-12 MG-GM -GM/160ML PO SOLN: 1.0000 | Freq: Once | ORAL | 0 refills | Status: AC

## 2020-12-06 MED ORDER — LUBIPROSTONE 8 MCG PO CAPS: ORAL_CAPSULE | ORAL | 2 refills | Status: DC

## 2020-12-06 NOTE — Telephone Encounter (Signed)
PA for EGD submitted via [facility] website. PA# [id number], valid 12/11/20-03/11/21. No PA needed for TCS.

## 2020-12-06 NOTE — Patient Instructions (Signed)
1. Start Amitiza 37mcg once or twice daily with food for constipation. Hold for a day if you get diarrhea.  2. Colonoscopy with upper endoscopy as scheduled. See separate instructions.  3. If worsening weakness, dizziness, shortness of breath or bloody stools, go to the ER.

## 2020-12-06 NOTE — Progress Notes (Signed)
Cc'ed to pcp °

## 2020-12-06 NOTE — Progress Notes (Signed)
Primary Care Physician:  Loman Brooklyn, FNP  Primary Gastroenterologist:  Elon Alas. Abbey Chatters, DO   Chief Complaint  Patient presents with  . Anemia    Has dark, black stool but takes iron daily  . Consult    TCS never done prior    HPI:  Mckenzie [name] is a 75 y.o. female here at the request of [name], [place] for further evaluation of iron deficiency anemia, consider colonoscopy.  Patient developed acute illness including myalgias and proximal muscle weakness which began in October.  Started having some confusion.  Has been evaluated by neuro, Dr. Felecia Shelling and ortho, Dr. Gladstone Lighter. Per daughter labs by Dr. Gladstone Lighter were performed and there was some notice of mild anemia in early part of December.  Patient suffered a fall December 30, presented to the ED for evaluation.  At that time her sodium was 134, potassium 3.4, LFTs normal, white blood cell count 14,200, hemoglobin 10.4, hematocrit 32.2, platelet 613,000.  SARS coronavirus 2 neg. vitamin D was slightly low.  Hemoglobin back in March was normal.  No longer able to perform ADLs.  Requiring 24-hour care.  Of note patient fractured both wrists, surgical repair required for the left wrist.  Additional labs February 15 showed iron sats of 6%, iron 12, TIBC 198, ferritin 540, B12 309, folate 10.7, white blood cell count 11,500, hemoglobin 9, hematocrit 28.9, platelet 670,000.  Alkaline phosphatase up to 152, ALT up at 45 both newly elevated.  Heme-negative stool.  Labs from February 25 with a BNP of 213, TIBC 198, iron sats 9%, iron 18, white blood cell count 12,000, hemoglobin 9.6, MCV 78, platelet 738,000.  CT abdomen pelvis with contrast November 30, 2020: Small pericardial effusion and small left pleural effusion noted.  Tiny calcified gallstones.  Mild sigmoid diverticulosis.  Chest x-ray November 30, 2020: No acute intrathoracic process.  Today: Patient presents with her daughter Ota Ebersole.  She continues to be significantly  weak. Continues to have episodes of confusion.  Requiring 24-hour care.  Daughter reiterates that prior to onset of illness around October, she was living at home alone with her spouse, taking care of young children during the day.  Patient currently barely maintaining.  She is lost about 5 pounds overall.  Poor appetite.  No postprandial abdominal pain or nausea or vomiting.  Can only eat very small portions at a time.  Uses meal replacement shakes once daily.  Eats a bowl of cereal and banana for breakfast.  Sandwich for lunch.  Chronically constipated.  Taking 3 stool softeners per day with very little improvement.  Small amount of stool every few days.  Stools have been black.  Per daughter they were black prior to starting iron.  Patient denies heartburn or dysphagia.  No history of aspirin or NSAID use.  She was taking Tylenol maximum recommended amount for for pain prior to December.          Current Outpatient Medications  Medication Sig Dispense Refill  . COMBIGAN 0.2-0.5 % ophthalmic solution Place 1 drop into both eyes every 12 (twelve) hours.    . dorzolamide (TRUSOPT) 2 % ophthalmic solution Place 1 drop into both eyes 2 (two) times daily.    Marland Kitchen escitalopram (LEXAPRO) 20 MG tablet TAKE 1/2 (ONE-HALF) TABLET BY MOUTH TWICE DAILY (Patient taking differently: Take 20 mg by mouth daily.) 90 tablet 4  . ferrous sulfate 325 (65 FE) MG tablet Take 325 mg by mouth 2 (two) times daily with a meal.    .  fluorometholone (FML) 0.1 % ophthalmic suspension Place 1 drop into the right eye 2 (two) times daily.    . hydroxypropyl methylcellulose / hypromellose (ISOPTO TEARS / GONIOVISC) 2.5 % ophthalmic solution Place 1 drop into both eyes 3 (three) times daily as needed for dry eyes.    Marland Kitchen latanoprost (XALATAN) 0.005 % ophthalmic solution Place 1 drop into both eyes at bedtime.    Mckinley Jewel Dimesylate (RHOPRESSA) 0.02 % SOLN Place 1 drop into both eyes in the morning and at bedtime.    Marland Kitchen PROLENSA 0.07  % SOLN Place 1 drop into the right eye daily.    . Vitamin D, Ergocalciferol, (DRISDOL) 1.25 MG (50000 UNIT) CAPS capsule Take 1 capsule (50,000 Units total) by mouth every 7 (seven) days. 13 capsule 1   No current facility-administered medications for this visit.    Allergies as of 12/06/2020 - Review Complete 12/06/2020  Allergen Reaction Noted  . Aldomet [methyldopa] Other (See Comments) 01/11/2012  . Fosamax [alendronate sodium]  03/09/2013  . Acetazolamide Rash 05/30/2016  . Buspar [buspirone] Palpitations 11/11/2014    Past Medical History:  Diagnosis Date  . Depression   . High cholesterol   . Hypertension   . Retinal micro-aneurysm of right eye     Past Surgical History:  Procedure Laterality Date  . EYE SURGERY    . FINGER SURGERY Left    RING FINGER  . OPEN REDUCTION INTERNAL FIXATION (ORIF) DISTAL RADIAL FRACTURE Left 10/05/2020   Procedure: OPEN REDUCTION INTERNAL FIXATION (ORIF) DISTAL RADIAL FRACTURE;  Surgeon: Shona Needles, MD;  Location: [facility];  Service: Orthopedics;  Laterality: Left;  supraclavicular block  . TUBAL LIGATION      Family History  Problem Relation Age of Onset  . Hyperlipidemia Mother   . Hypertension Mother   . Diabetes Mother   . COPD Father   . Depression Sister   . Drug abuse Sister   . Suicidality Brother   . Colon cancer Neg Hx   . Celiac disease Neg Hx   . Inflammatory bowel disease Neg Hx     Social History   Socioeconomic History  . Marital status: Married    Spouse name: Not on file  . Number of children: 3  . Years of education: 15  . Highest education level: Not on file  Occupational History  . Not on file  Tobacco Use  . Smoking status: Former Smoker    Types: Cigarettes  . Smokeless tobacco: Never Used  . Tobacco comment: for a short period  Vaping Use  . Vaping Use: Never used  Substance and Sexual Activity  . Alcohol use: No  . Drug use: No  . Sexual activity: Not on file  Other Topics Concern  .  Not on file  Social History Narrative   Right handed   Caffeine use: coffee daily   Social Determinants of Health   Financial Resource Strain: Not on file  Food Insecurity: Not on file  Transportation Needs: Not on file  Physical Activity: Not on file  Stress: Not on file  Social Connections: Not on file  Intimate Partner Violence: Not on file      ROS:  General: Negative for   fever, chills, fatigue, positive weakness.  Complains of dizziness at times.  See HPI Eyes: Negative for vision changes.  ENT: Negative for hoarseness, difficulty swallowing , nasal congestion. CV: Negative for chest pain, angina, palpitations, dyspnea on exertion, peripheral edema.  Respiratory: Negative for dyspnea at rest,  dyspnea on exertion, cough, sputum, wheezing.  GI: See history of present illness. GU:  Negative for dysuria, hematuria, urinary incontinence, urinary frequency, nocturnal urination.  MS: Negative for joint pain, low back pain.  Derm: Negative for rash or itching.  Neuro: Negative for weakness, abnormal sensation, seizure, frequent headaches, memory loss, positive confusion.  Psych: Positive for anxiety, depression, negative suicidal ideation, hallucinations.  Endo: See HPI Heme: Negative for bruising or bleeding. Allergy: Negative for rash or hives.    Physical Examination:  BP 122/68   Pulse 81   Temp (!) 97.3 F (36.3 C)   Ht 5\' 5"  (1.651 m)   Wt 105 lb (47.6 kg)   BMI 17.47 kg/m    General: Frail-appearing thin female in no acute distress.  Head: Normocephalic, atraumatic.   Eyes: Conjunctiva pale, no icterus. Mouth: masked. Neck: Supple without thyromegaly, masses, or lymphadenopathy.  Lungs: Clear to auscultation bilaterally.  Heart: Regular rate and rhythm, no murmurs rubs or gallops.  Abdomen: Bowel sounds are normal, nontender, nondistended, no hepatosplenomegaly or masses, no abdominal bruits or    hernia , no rebound or guarding.   Rectal: not  performed Extremities: No lower extremity edema. No clubbing or deformities.  Right wrist splinted Neuro: Alert and oriented x 4 , grossly normal neurologically.  Skin: Warm and dry, no rash or jaundice.  Pale Psych: Alert and cooperative, normal mood and affect.  Labs: Lab Results  Component Value Date   CREATININE 0.38 (L) 12/01/2020   BUN 8 12/01/2020   NA 129 (L) 12/01/2020   K 4.9 12/01/2020   CL 92 (L) 12/01/2020   CO2 22 12/01/2020   Lab Results  Component Value Date   ALT 52 (H) 12/01/2020   AST 33 12/01/2020   ALKPHOS 177 (H) 12/01/2020   BILITOT 0.2 12/01/2020   Lab Results  Component Value Date   WBC 12.0 (H) 12/01/2020   HGB 9.6 (L) 12/01/2020   HCT 30.4 (L) 12/01/2020   MCV 78 (L) 12/01/2020   PLT 738 (H) 12/01/2020   Lab Results  Component Value Date   IRON 18 (L) 12/01/2020   TIBC 198 (L) 12/01/2020   FERRITIN 553 (H) 12/01/2020   Lab Results  Component Value Date   VITAMINB12 406 12/01/2020   Lab Results  Component Value Date   FOLATE 14.6 12/01/2020   Lab Results  Component Value Date   HGBA1C 5.5 11/21/2020     Imaging Studies: DG Chest 2 View  Result Date: 11/30/2020 CLINICAL DATA:  Confusion, hyponatremia EXAM: CHEST - 2 VIEW COMPARISON:  10/05/2020 FINDINGS: Frontal and lateral views of the chest demonstrate mild enlargement of the cardiac silhouette. No airspace disease, effusion, or pneumothorax. No acute bony abnormalities. IMPRESSION: 1. No acute intrathoracic process. Electronically Signed   By: Randa Ngo M.D.   On: 11/30/2020 15:57   CT Abdomen Pelvis W Contrast  Result Date: 11/30/2020 CLINICAL DATA:  Generalized abdominal pain. Constipation. Elevated liver function tests. EXAM: CT ABDOMEN AND PELVIS WITH CONTRAST TECHNIQUE: Multidetector CT imaging of the abdomen and pelvis was performed using the standard protocol following bolus administration of intravenous contrast. CONTRAST:  69mL OMNIPAQUE IOHEXOL 300 MG/ML  SOLN  COMPARISON:  None. FINDINGS: Lower Chest: Small pericardial effusion and small left pleural effusion are noted. Hepatobiliary: No hepatic masses identified. A few tiny calcified gallstones are seen, however there is no evidence of cholecystitis or biliary ductal dilatation. Pancreas:  No mass or inflammatory changes. Spleen: Within normal limits in size and  appearance. Adrenals/Urinary Tract: No masses identified. Small left renal cyst noted. No evidence of ureteral calculi or hydronephrosis. Stomach/Bowel: No evidence of obstruction, inflammatory process or abnormal fluid collections. Normal appendix visualized. Mild Diverticulosis is seen involving the sigmoid colon, however there is no evidence of diverticulitis. Vascular/Lymphatic: No pathologically enlarged lymph nodes. No abdominal aortic aneurysm. Aortic atherosclerotic calcification noted. Reproductive:  No mass or other significant abnormality. Other:  None. Musculoskeletal:  No suspicious bone lesions identified. IMPRESSION: No acute findings within the abdomen or pelvis. Cholelithiasis. No radiographic evidence of cholecystitis. Mild sigmoid diverticulosis. No radiographic evidence of diverticulitis. Small pericardial effusion and small left pleural effusion. Electronically Signed   By: Marlaine Hind M.D.   On: 11/30/2020 18:55   Assessment/plan:  75 year old female with history of hypertension, depression, hyperlipidemia presenting for further evaluation of IDA.  IDA: Anemia first noted back in December when patient presented with myalgias, proximal muscle weakness, fall resulting in bilateral fractured wrists.  Hemoglobin have been normal back in March.  Anemia panel with low serum iron, iron saturations, low TIBC, ferritin significantly elevated (most likely reactive).  Also noted to have mild leukocytosis and significant thrombocytosis.  Thrombocytosis may be reactive due to IDA.  Interestingly, patient's myalgias and back pain have resolved.   Per daughter, work-up by ortho and neuro without any significant findings, possible fibromyalgia, spinal stenosis.  Fortunately she has not required blood transfusion.  She has been on iron 3 months.  Reports stools are black.  No fresh blood per rectum.  Heme-negative x1.  From a GI standpoint we will evaluate for IDA, however discussed with patient and daughter today I am concerned there may be another underlying etiology given significantly elevated ferritin, thrombocytosis, leukocytosis.  She is quite frail on exam.  She is taking liquids well.  We discussed possibility of upper endoscopy given her early satiety, poor appetite, question melena and then circling back around for a colonoscopy if necessary.  However, to expedite work-up, patient and daughter would like to proceed with colonoscopy at time of endoscopy if she is able to tolerate prep.  Schedule her for colonoscopy with EGD with Dr. Abbey Chatters in the near future. ASA II.  I have discussed the risks, alternatives, benefits with regards to but not limited to the risk of reaction to medication, bleeding, infection, perforation and the patient is agreeable to proceed. Written consent to be obtained.  Discussed at length with patient and daughter, need to hydrate before and during bowel prep for procedure.  We will update labs this week.  ER precautions provided.  Constipation: Chronic in nature.  Plans to add Amitiza 8 mcg 1-2 times daily with food.  We will start this after her colonoscopy.  She was provided with samples of Linzess 145 mcg to take daily for 3 to 4 days prior to colonoscopy prep.  Given instructions to skip a day if she developed significant diarrhea with medication.

## 2020-12-07 ENCOUNTER — Other Ambulatory Visit: Payer: Self-pay

## 2020-12-07 ENCOUNTER — Inpatient Hospital Stay (HOSPITAL_COMMUNITY)
Admission: EM | Admit: 2020-12-07 | Discharge: 2020-12-19 | DRG: 628 | Disposition: A | Discharge status: Home Health | Payer: Medicare Other | Attending: Internal Medicine | Admitting: Internal Medicine

## 2020-12-07 DIAGNOSIS — E876 Hypokalemia: Secondary | ICD-10-CM | POA: Diagnosis not present

## 2020-12-07 DIAGNOSIS — E871 Hypo-osmolality and hyponatremia: Secondary | ICD-10-CM | POA: Diagnosis not present

## 2020-12-07 DIAGNOSIS — Z888 Allergy status to other drugs, medicaments and biological substances status: Secondary | ICD-10-CM

## 2020-12-07 DIAGNOSIS — I5032 Chronic diastolic (congestive) heart failure: Secondary | ICD-10-CM | POA: Diagnosis present

## 2020-12-07 DIAGNOSIS — D120 Benign neoplasm of cecum: Secondary | ICD-10-CM | POA: Diagnosis present

## 2020-12-07 DIAGNOSIS — D638 Anemia in other chronic diseases classified elsewhere: Secondary | ICD-10-CM | POA: Diagnosis present

## 2020-12-07 DIAGNOSIS — J90 Pleural effusion, not elsewhere classified: Secondary | ICD-10-CM | POA: Diagnosis present

## 2020-12-07 DIAGNOSIS — Z813 Family history of other psychoactive substance abuse and dependence: Secondary | ICD-10-CM

## 2020-12-07 DIAGNOSIS — Z8249 Family history of ischemic heart disease and other diseases of the circulatory system: Secondary | ICD-10-CM

## 2020-12-07 DIAGNOSIS — L89151 Pressure ulcer of sacral region, stage 1: Secondary | ICD-10-CM | POA: Diagnosis present

## 2020-12-07 DIAGNOSIS — E440 Moderate protein-calorie malnutrition: Secondary | ICD-10-CM | POA: Insufficient documentation

## 2020-12-07 DIAGNOSIS — Z825 Family history of asthma and other chronic lower respiratory diseases: Secondary | ICD-10-CM

## 2020-12-07 DIAGNOSIS — K5730 Diverticulosis of large intestine without perforation or abscess without bleeding: Secondary | ICD-10-CM | POA: Diagnosis present

## 2020-12-07 DIAGNOSIS — E222 Syndrome of inappropriate secretion of antidiuretic hormone: Secondary | ICD-10-CM | POA: Diagnosis not present

## 2020-12-07 DIAGNOSIS — F0390 Unspecified dementia without behavioral disturbance: Secondary | ICD-10-CM | POA: Diagnosis present

## 2020-12-07 DIAGNOSIS — Z83438 Family history of other disorder of lipoprotein metabolism and other lipidemia: Secondary | ICD-10-CM

## 2020-12-07 DIAGNOSIS — I10 Essential (primary) hypertension: Secondary | ICD-10-CM | POA: Diagnosis present

## 2020-12-07 DIAGNOSIS — R627 Adult failure to thrive: Secondary | ICD-10-CM | POA: Diagnosis present

## 2020-12-07 DIAGNOSIS — T380X5A Adverse effect of glucocorticoids and synthetic analogues, initial encounter: Secondary | ICD-10-CM | POA: Diagnosis not present

## 2020-12-07 DIAGNOSIS — M316 Other giant cell arteritis: Secondary | ICD-10-CM | POA: Diagnosis present

## 2020-12-07 DIAGNOSIS — F4323 Adjustment disorder with mixed anxiety and depressed mood: Secondary | ICD-10-CM | POA: Diagnosis present

## 2020-12-07 DIAGNOSIS — M6289 Other specified disorders of muscle: Secondary | ICD-10-CM | POA: Diagnosis present

## 2020-12-07 DIAGNOSIS — Z681 Body mass index (BMI) 19 or less, adult: Secondary | ICD-10-CM

## 2020-12-07 DIAGNOSIS — Z818 Family history of other mental and behavioral disorders: Secondary | ICD-10-CM

## 2020-12-07 DIAGNOSIS — R413 Other amnesia: Secondary | ICD-10-CM | POA: Diagnosis present

## 2020-12-07 DIAGNOSIS — I110 Hypertensive heart disease with heart failure: Secondary | ICD-10-CM | POA: Diagnosis present

## 2020-12-07 DIAGNOSIS — M542 Cervicalgia: Secondary | ICD-10-CM

## 2020-12-07 DIAGNOSIS — I313 Pericardial effusion (noninflammatory): Secondary | ICD-10-CM | POA: Diagnosis present

## 2020-12-07 DIAGNOSIS — E7800 Pure hypercholesterolemia, unspecified: Secondary | ICD-10-CM | POA: Diagnosis present

## 2020-12-07 DIAGNOSIS — E785 Hyperlipidemia, unspecified: Secondary | ICD-10-CM | POA: Diagnosis present

## 2020-12-07 DIAGNOSIS — E861 Hypovolemia: Secondary | ICD-10-CM | POA: Diagnosis present

## 2020-12-07 DIAGNOSIS — E8809 Other disorders of plasma-protein metabolism, not elsewhere classified: Secondary | ICD-10-CM | POA: Diagnosis present

## 2020-12-07 DIAGNOSIS — Z833 Family history of diabetes mellitus: Secondary | ICD-10-CM

## 2020-12-07 DIAGNOSIS — J439 Emphysema, unspecified: Secondary | ICD-10-CM | POA: Diagnosis present

## 2020-12-07 DIAGNOSIS — D124 Benign neoplasm of descending colon: Secondary | ICD-10-CM | POA: Diagnosis present

## 2020-12-07 DIAGNOSIS — Z87891 Personal history of nicotine dependence: Secondary | ICD-10-CM

## 2020-12-07 DIAGNOSIS — H4050X0 Glaucoma secondary to other eye disorders, unspecified eye, stage unspecified: Secondary | ICD-10-CM | POA: Diagnosis present

## 2020-12-07 DIAGNOSIS — K5900 Constipation, unspecified: Secondary | ICD-10-CM | POA: Diagnosis present

## 2020-12-07 DIAGNOSIS — M5136 Other intervertebral disc degeneration, lumbar region: Secondary | ICD-10-CM | POA: Diagnosis present

## 2020-12-07 DIAGNOSIS — K449 Diaphragmatic hernia without obstruction or gangrene: Secondary | ICD-10-CM | POA: Diagnosis present

## 2020-12-07 DIAGNOSIS — Z8673 Personal history of transient ischemic attack (TIA), and cerebral infarction without residual deficits: Secondary | ICD-10-CM

## 2020-12-07 DIAGNOSIS — D75839 Thrombocytosis, unspecified: Secondary | ICD-10-CM | POA: Diagnosis present

## 2020-12-07 DIAGNOSIS — L8990 Pressure ulcer of unspecified site, unspecified stage: Secondary | ICD-10-CM | POA: Insufficient documentation

## 2020-12-07 DIAGNOSIS — Z20822 Contact with and (suspected) exposure to covid-19: Secondary | ICD-10-CM | POA: Diagnosis present

## 2020-12-07 DIAGNOSIS — D509 Iron deficiency anemia, unspecified: Secondary | ICD-10-CM | POA: Diagnosis present

## 2020-12-07 DIAGNOSIS — E1165 Type 2 diabetes mellitus with hyperglycemia: Secondary | ICD-10-CM | POA: Diagnosis not present

## 2020-12-07 DIAGNOSIS — D649 Anemia, unspecified: Secondary | ICD-10-CM

## 2020-12-07 DIAGNOSIS — E43 Unspecified severe protein-calorie malnutrition: Secondary | ICD-10-CM | POA: Diagnosis present

## 2020-12-07 DIAGNOSIS — E1159 Type 2 diabetes mellitus with other circulatory complications: Secondary | ICD-10-CM | POA: Diagnosis present

## 2020-12-07 DIAGNOSIS — H3530 Unspecified macular degeneration: Secondary | ICD-10-CM | POA: Diagnosis present

## 2020-12-07 LAB — COMPREHENSIVE METABOLIC PANEL
ALT: 60 IU/L — ABNORMAL HIGH (ref 0–32)
AST: 42 IU/L — ABNORMAL HIGH (ref 0–40)
Albumin/Globulin Ratio: 0.6 — ABNORMAL LOW (ref 1.2–2.2)
Albumin: 2.7 g/dL — ABNORMAL LOW (ref 3.7–4.7)
Alkaline Phosphatase: 163 IU/L — ABNORMAL HIGH (ref 44–121)
BUN/Creatinine Ratio: 26 (ref 12–28)
BUN: 10 mg/dL (ref 8–27)
Bilirubin Total: <0.2 mg/dL (ref 0.0–1.2)
CO2: 22 mmol/L (ref 20–29)
Calcium: 9.1 mg/dL (ref 8.7–10.3)
Chloride: 91 mmol/L — ABNORMAL LOW (ref 96–106)
Creatinine, Ser: 0.39 mg/dL — ABNORMAL LOW (ref 0.57–1.00)
Globulin, Total: 4.5 g/dL (ref 1.5–4.5)
Glucose: 113 mg/dL — ABNORMAL HIGH (ref 65–99)
Potassium: 5.2 mmol/L (ref 3.5–5.2)
Sodium: 126 mmol/L — ABNORMAL LOW (ref 134–144)
Total Protein: 7.2 g/dL (ref 6.0–8.5)
eGFR: 104 mL/min/{1.73_m2} (ref >59)

## 2020-12-07 LAB — CBC WITH DIFFERENTIAL/PLATELET
Basophils Absolute: 0 10*3/uL (ref 0.0–0.2)
Basos: 0 %
EOS (ABSOLUTE): 0.1 10*3/uL (ref 0.0–0.4)
Eos: 1 %
Hematocrit: 28.4 % — ABNORMAL LOW (ref 34.0–46.6)
Hemoglobin: 8.7 g/dL — ABNORMAL LOW (ref 11.1–15.9)
Immature Grans (Abs): 0.1 10*3/uL (ref 0.0–0.1)
Immature Granulocytes: 1 %
Lymphocytes Absolute: 1.1 10*3/uL (ref 0.7–3.1)
Lymphs: 10 %
MCH: 24 pg — ABNORMAL LOW (ref 26.6–33.0)
MCHC: 30.6 g/dL — ABNORMAL LOW (ref 31.5–35.7)
MCV: 79 fL (ref 79–97)
Monocytes Absolute: 0.9 10*3/uL (ref 0.1–0.9)
Monocytes: 8 %
Neutrophils Absolute: 8.9 10*3/uL — ABNORMAL HIGH (ref 1.4–7.0)
Neutrophils: 80 %
Platelets: 666 10*3/uL — ABNORMAL HIGH (ref 150–450)
RBC: 3.62 x10E6/uL — ABNORMAL LOW (ref 3.77–5.28)
RDW: 14 % (ref 11.7–15.4)
WBC: 11 10*3/uL — ABNORMAL HIGH (ref 3.4–10.8)

## 2020-12-07 LAB — CBC
HCT: 29 % — ABNORMAL LOW (ref 36.0–46.0)
Hemoglobin: 8.5 g/dL — ABNORMAL LOW (ref 12.0–15.0)
MCH: 23.3 pg — ABNORMAL LOW (ref 26.0–34.0)
MCHC: 29.3 g/dL — ABNORMAL LOW (ref 30.0–36.0)
MCV: 79.5 fL — ABNORMAL LOW (ref 80.0–100.0)
Platelets: 686 10*3/uL — ABNORMAL HIGH (ref 150–400)
RBC: 3.65 MIL/uL — ABNORMAL LOW (ref 3.87–5.11)
RDW: 15.2 % (ref 11.5–15.5)
WBC: 11.3 10*3/uL — ABNORMAL HIGH (ref 4.0–10.5)
nRBC: 0 % (ref 0.0–0.2)

## 2020-12-07 LAB — BASIC METABOLIC PANEL
Anion gap: 8 (ref 5–15)
BUN: 10 mg/dL (ref 8–23)
CO2: 25 mmol/L (ref 22–32)
Calcium: 8.6 mg/dL — ABNORMAL LOW (ref 8.9–10.3)
Chloride: 92 mmol/L — ABNORMAL LOW (ref 98–111)
Creatinine, Ser: 0.34 mg/dL — ABNORMAL LOW (ref 0.44–1.00)
GFR, Estimated: >60 mL/min (ref >60)
Glucose, Bld: 103 mg/dL — ABNORMAL HIGH (ref 70–99)
Potassium: 4.2 mmol/L (ref 3.5–5.1)
Sodium: 125 mmol/L — ABNORMAL LOW (ref 135–145)

## 2020-12-07 LAB — FERRITIN: Ferritin: 496 ng/mL — ABNORMAL HIGH (ref 15–150)

## 2020-12-07 NOTE — ED Triage Notes (Signed)
Daughter stated, she's been having fatigue not wanting to eat and the Dr. Lenon Ahmadi she has low Na, K, and is anemic. She is scheduled for a colonoscopy next week.

## 2020-12-08 ENCOUNTER — Other Ambulatory Visit: Payer: Self-pay

## 2020-12-08 ENCOUNTER — Encounter (HOSPITAL_COMMUNITY): Payer: Self-pay | Admitting: Emergency Medicine

## 2020-12-08 ENCOUNTER — Inpatient Hospital Stay (HOSPITAL_COMMUNITY): Payer: Medicare Other

## 2020-12-08 ENCOUNTER — Other Ambulatory Visit (HOSPITAL_COMMUNITY)
Admission: RE | Admit: 2020-12-08 | Discharge: 2020-12-08 | Disposition: A | Discharge status: Home / Self Care | Payer: Medicare Other | Source: Ambulatory Visit | Attending: Internal Medicine | Admitting: Internal Medicine

## 2020-12-08 ENCOUNTER — Telehealth: Payer: Self-pay

## 2020-12-08 DIAGNOSIS — D638 Anemia in other chronic diseases classified elsewhere: Secondary | ICD-10-CM | POA: Diagnosis present

## 2020-12-08 DIAGNOSIS — D75839 Thrombocytosis, unspecified: Secondary | ICD-10-CM | POA: Diagnosis present

## 2020-12-08 DIAGNOSIS — E876 Hypokalemia: Secondary | ICD-10-CM | POA: Diagnosis not present

## 2020-12-08 DIAGNOSIS — K449 Diaphragmatic hernia without obstruction or gangrene: Secondary | ICD-10-CM | POA: Diagnosis present

## 2020-12-08 DIAGNOSIS — J90 Pleural effusion, not elsewhere classified: Secondary | ICD-10-CM | POA: Diagnosis present

## 2020-12-08 DIAGNOSIS — I110 Hypertensive heart disease with heart failure: Secondary | ICD-10-CM | POA: Diagnosis present

## 2020-12-08 DIAGNOSIS — K635 Polyp of colon: Secondary | ICD-10-CM | POA: Diagnosis not present

## 2020-12-08 DIAGNOSIS — R413 Other amnesia: Secondary | ICD-10-CM | POA: Diagnosis present

## 2020-12-08 DIAGNOSIS — R531 Weakness: Secondary | ICD-10-CM | POA: Diagnosis not present

## 2020-12-08 DIAGNOSIS — E222 Syndrome of inappropriate secretion of antidiuretic hormone: Secondary | ICD-10-CM | POA: Diagnosis present

## 2020-12-08 DIAGNOSIS — M316 Other giant cell arteritis: Secondary | ICD-10-CM | POA: Diagnosis present

## 2020-12-08 DIAGNOSIS — E785 Hyperlipidemia, unspecified: Secondary | ICD-10-CM | POA: Diagnosis present

## 2020-12-08 DIAGNOSIS — M6289 Other specified disorders of muscle: Secondary | ICD-10-CM

## 2020-12-08 DIAGNOSIS — F4323 Adjustment disorder with mixed anxiety and depressed mood: Secondary | ICD-10-CM

## 2020-12-08 DIAGNOSIS — H4050X0 Glaucoma secondary to other eye disorders, unspecified eye, stage unspecified: Secondary | ICD-10-CM | POA: Diagnosis present

## 2020-12-08 DIAGNOSIS — E861 Hypovolemia: Secondary | ICD-10-CM | POA: Diagnosis present

## 2020-12-08 DIAGNOSIS — I517 Cardiomegaly: Secondary | ICD-10-CM | POA: Diagnosis not present

## 2020-12-08 DIAGNOSIS — K317 Polyp of stomach and duodenum: Secondary | ICD-10-CM | POA: Diagnosis not present

## 2020-12-08 DIAGNOSIS — I5032 Chronic diastolic (congestive) heart failure: Secondary | ICD-10-CM | POA: Diagnosis present

## 2020-12-08 DIAGNOSIS — Z681 Body mass index (BMI) 19 or less, adult: Secondary | ICD-10-CM | POA: Diagnosis not present

## 2020-12-08 DIAGNOSIS — I313 Pericardial effusion (noninflammatory): Secondary | ICD-10-CM | POA: Diagnosis present

## 2020-12-08 DIAGNOSIS — E871 Hypo-osmolality and hyponatremia: Secondary | ICD-10-CM | POA: Diagnosis present

## 2020-12-08 DIAGNOSIS — E43 Unspecified severe protein-calorie malnutrition: Secondary | ICD-10-CM | POA: Diagnosis present

## 2020-12-08 DIAGNOSIS — K5900 Constipation, unspecified: Secondary | ICD-10-CM | POA: Diagnosis present

## 2020-12-08 DIAGNOSIS — E8809 Other disorders of plasma-protein metabolism, not elsewhere classified: Secondary | ICD-10-CM | POA: Diagnosis present

## 2020-12-08 DIAGNOSIS — L89151 Pressure ulcer of sacral region, stage 1: Secondary | ICD-10-CM | POA: Diagnosis present

## 2020-12-08 DIAGNOSIS — Z8673 Personal history of transient ischemic attack (TIA), and cerebral infarction without residual deficits: Secondary | ICD-10-CM | POA: Diagnosis not present

## 2020-12-08 DIAGNOSIS — D649 Anemia, unspecified: Secondary | ICD-10-CM | POA: Diagnosis not present

## 2020-12-08 DIAGNOSIS — D509 Iron deficiency anemia, unspecified: Secondary | ICD-10-CM

## 2020-12-08 DIAGNOSIS — F0390 Unspecified dementia without behavioral disturbance: Secondary | ICD-10-CM | POA: Diagnosis present

## 2020-12-08 DIAGNOSIS — H3530 Unspecified macular degeneration: Secondary | ICD-10-CM | POA: Diagnosis present

## 2020-12-08 DIAGNOSIS — Z20822 Contact with and (suspected) exposure to covid-19: Secondary | ICD-10-CM | POA: Diagnosis present

## 2020-12-08 DIAGNOSIS — E7800 Pure hypercholesterolemia, unspecified: Secondary | ICD-10-CM | POA: Diagnosis present

## 2020-12-08 LAB — URINALYSIS, ROUTINE W REFLEX MICROSCOPIC
Bilirubin Urine: NEGATIVE
Glucose, UA: NEGATIVE mg/dL
Hgb urine dipstick: NEGATIVE
Ketones, ur: NEGATIVE mg/dL
Leukocytes,Ua: NEGATIVE
Nitrite: NEGATIVE
Protein, ur: NEGATIVE mg/dL
Specific Gravity, Urine: 1.019 (ref 1.005–1.030)
pH: 6 (ref 5.0–8.0)

## 2020-12-08 LAB — TROPONIN I (HIGH SENSITIVITY)
Troponin I (High Sensitivity): 5 ng/L (ref <18)
Troponin I (High Sensitivity): 5 ng/L (ref <18)

## 2020-12-08 LAB — HEPATIC FUNCTION PANEL
ALT: 50 U/L — ABNORMAL HIGH (ref 0–44)
AST: 29 U/L (ref 15–41)
Albumin: 1.6 g/dL — ABNORMAL LOW (ref 3.5–5.0)
Alkaline Phosphatase: 115 U/L (ref 38–126)
Bilirubin, Direct: <0.1 mg/dL (ref 0.0–0.2)
Total Bilirubin: 0.4 mg/dL (ref 0.3–1.2)
Total Protein: 6.1 g/dL — ABNORMAL LOW (ref 6.5–8.1)

## 2020-12-08 LAB — BASIC METABOLIC PANEL
Anion gap: 8 (ref 5–15)
Anion gap: 7 (ref 5–15)
BUN: 5 mg/dL — ABNORMAL LOW (ref 8–23)
BUN: 9 mg/dL (ref 8–23)
CO2: 24 mmol/L (ref 22–32)
CO2: 24 mmol/L (ref 22–32)
Calcium: 8.3 mg/dL — ABNORMAL LOW (ref 8.9–10.3)
Calcium: 8.2 mg/dL — ABNORMAL LOW (ref 8.9–10.3)
Chloride: 97 mmol/L — ABNORMAL LOW (ref 98–111)
Chloride: 98 mmol/L (ref 98–111)
Creatinine, Ser: <0.3 mg/dL — ABNORMAL LOW (ref 0.44–1.00)
Creatinine, Ser: 0.51 mg/dL (ref 0.44–1.00)
GFR, Estimated: >60 mL/min (ref >60)
Glucose, Bld: 112 mg/dL — ABNORMAL HIGH (ref 70–99)
Glucose, Bld: 128 mg/dL — ABNORMAL HIGH (ref 70–99)
Potassium: 3.6 mmol/L (ref 3.5–5.1)
Potassium: 3.6 mmol/L (ref 3.5–5.1)
Sodium: 129 mmol/L — ABNORMAL LOW (ref 135–145)
Sodium: 129 mmol/L — ABNORMAL LOW (ref 135–145)

## 2020-12-08 LAB — ECHOCARDIOGRAM COMPLETE
Area-P 1/2: 3.99 cm2
S' Lateral: 2.9 cm

## 2020-12-08 LAB — LACTATE DEHYDROGENASE: LDH: 89 U/L — ABNORMAL LOW (ref 98–192)

## 2020-12-08 LAB — RESP PANEL BY RT-PCR (FLU A&B, COVID) ARPGX2
Influenza A by PCR: NEGATIVE
Influenza B by PCR: NEGATIVE
SARS Coronavirus 2 by RT PCR: NEGATIVE

## 2020-12-08 LAB — PREALBUMIN: Prealbumin: <5 mg/dL — ABNORMAL LOW (ref 18–38)

## 2020-12-08 LAB — MAGNESIUM: Magnesium: 1.7 mg/dL (ref 1.7–2.4)

## 2020-12-08 LAB — POC OCCULT BLOOD, ED: Fecal Occult Bld: NEGATIVE

## 2020-12-08 LAB — SAVE SMEAR(SSMR), FOR PROVIDER SLIDE REVIEW

## 2020-12-08 LAB — T4, FREE: Free T4: 0.91 ng/dL (ref 0.61–1.12)

## 2020-12-08 MED ORDER — SODIUM CHLORIDE 0.9 % IV SOLN: INTRAVENOUS | Status: DC

## 2020-12-08 MED ORDER — SODIUM CHLORIDE 0.9 % IV BOLUS
500.0000 mL | Freq: Once | INTRAVENOUS | Status: AC
  Administered 2020-12-08: 500 mL via INTRAVENOUS

## 2020-12-08 MED ORDER — ONDANSETRON HCL 4 MG/2ML IJ SOLN: 4.0000 mg | Freq: Four times a day (QID) | INTRAMUSCULAR | Status: DC | PRN

## 2020-12-08 MED ORDER — ONDANSETRON HCL 4 MG PO TABS: 4.0000 mg | ORAL_TABLET | Freq: Four times a day (QID) | ORAL | Status: DC | PRN

## 2020-12-08 MED ORDER — FERROUS SULFATE 325 (65 FE) MG PO TABS
325.0000 mg | ORAL_TABLET | Freq: Two times a day (BID) | ORAL | Status: DC
  Filled 2020-12-08: qty 1

## 2020-12-08 MED ORDER — PEG 3350-KCL-NA BICARB-NACL 420 G PO SOLR
4000.0000 mL | Freq: Once | ORAL | Status: AC
  Administered 2020-12-08: 4000 mL via ORAL
  Filled 2020-12-08 (×2): qty 4000

## 2020-12-08 MED ORDER — LUBIPROSTONE 8 MCG PO CAPS
8.0000 ug | ORAL_CAPSULE | Freq: Two times a day (BID) | ORAL | Status: DC
  Filled 2020-12-08: qty 1

## 2020-12-08 MED ORDER — TIMOLOL MALEATE 0.5 % OP SOLN
1.0000 [drp] | Freq: Two times a day (BID) | OPHTHALMIC | Status: DC
  Administered 2020-12-08 – 2020-12-19 (×22): 1 [drp] via OPHTHALMIC
  Filled 2020-12-08: qty 5

## 2020-12-08 MED ORDER — ESCITALOPRAM OXALATE 20 MG PO TABS
20.0000 mg | ORAL_TABLET | Freq: Every day | ORAL | Status: DC
  Administered 2020-12-08 – 2020-12-19 (×11): 20 mg via ORAL
  Filled 2020-12-08 (×9): qty 1
  Filled 2020-12-08: qty 2
  Filled 2020-12-08 (×2): qty 1

## 2020-12-08 MED ORDER — BRIMONIDINE TARTRATE 0.2 % OP SOLN
1.0000 [drp] | Freq: Two times a day (BID) | OPHTHALMIC | Status: DC
  Administered 2020-12-08 – 2020-12-19 (×22): 1 [drp] via OPHTHALMIC
  Filled 2020-12-08 (×2): qty 5

## 2020-12-08 MED ORDER — LATANOPROST 0.005 % OP SOLN
1.0000 [drp] | Freq: Every day | OPHTHALMIC | Status: DC
  Administered 2020-12-08 – 2020-12-18 (×11): 1 [drp] via OPHTHALMIC
  Filled 2020-12-08: qty 2.5

## 2020-12-08 MED ORDER — SODIUM CHLORIDE 0.9 % IV SOLN: Freq: Once | INTRAVENOUS | Status: AC

## 2020-12-08 MED ORDER — ACETAMINOPHEN 325 MG PO TABS: 650.0000 mg | ORAL_TABLET | Freq: Four times a day (QID) | ORAL | Status: DC | PRN

## 2020-12-08 MED ORDER — FLUOROMETHOLONE 0.1 % OP SUSP
1.0000 [drp] | Freq: Two times a day (BID) | OPHTHALMIC | Status: DC
  Administered 2020-12-08 – 2020-12-19 (×23): 1 [drp] via OPHTHALMIC
  Filled 2020-12-08 (×2): qty 5

## 2020-12-08 MED ORDER — ACETAMINOPHEN 650 MG RE SUPP: 650.0000 mg | Freq: Four times a day (QID) | RECTAL | Status: DC | PRN

## 2020-12-08 MED ORDER — BRIMONIDINE TARTRATE-TIMOLOL 0.2-0.5 % OP SOLN
1.0000 [drp] | Freq: Two times a day (BID) | OPHTHALMIC | Status: DC
  Filled 2020-12-08: qty 5

## 2020-12-08 MED ORDER — KETOROLAC TROMETHAMINE 0.5 % OP SOLN
1.0000 [drp] | Freq: Every day | OPHTHALMIC | Status: DC
  Administered 2020-12-08 – 2020-12-19 (×10): 1 [drp] via OPHTHALMIC
  Filled 2020-12-08: qty 5

## 2020-12-08 MED ORDER — SODIUM CHLORIDE 0.9% FLUSH
3.0000 mL | Freq: Two times a day (BID) | INTRAVENOUS | Status: DC
  Administered 2020-12-08 – 2020-12-19 (×21): 3 mL via INTRAVENOUS

## 2020-12-08 MED ORDER — POLYVINYL ALCOHOL 1.4 % OP SOLN
1.0000 [drp] | Freq: Three times a day (TID) | OPHTHALMIC | Status: DC | PRN
  Filled 2020-12-08 (×2): qty 15

## 2020-12-08 MED ORDER — LINACLOTIDE 145 MCG PO CAPS
145.0000 ug | ORAL_CAPSULE | Freq: Every day | ORAL | Status: DC
  Administered 2020-12-08 – 2020-12-13 (×5): 145 ug via ORAL
  Filled 2020-12-08 (×6): qty 1

## 2020-12-08 MED ORDER — NETARSUDIL DIMESYLATE 0.02 % OP SOLN
1.0000 [drp] | Freq: Two times a day (BID) | OPHTHALMIC | Status: DC
  Administered 2020-12-13: 1 [drp] via OPHTHALMIC

## 2020-12-08 MED ORDER — DORZOLAMIDE HCL 2 % OP SOLN
1.0000 [drp] | Freq: Two times a day (BID) | OPHTHALMIC | Status: DC
  Administered 2020-12-08 – 2020-12-19 (×23): 1 [drp] via OPHTHALMIC
  Filled 2020-12-08 (×2): qty 10

## 2020-12-08 NOTE — Progress Notes (Signed)
  Echocardiogram 2D Echocardiogram has been performed.  Fidel Levy 12/08/2020, 12:12 PM

## 2020-12-08 NOTE — ED Notes (Signed)
Lunch Tray Ordered @ 1034. 

## 2020-12-08 NOTE — Telephone Encounter (Signed)
Melaine at [facility] called office, pt is scheduled for procedure 12/11/20. She is currently at [facility] ED for hyponatremia and symptomatic anemia. She spoke to pt's daughter who thinks pt will still have procedure at [facility] Monday. Threasa Beards feels pt will be done as inpatient but kept her on schedule just in case.  FYI to Neil Crouch PA.

## 2020-12-08 NOTE — Progress Notes (Signed)
Called patient and left a message. After the call I noticed Mckenzie [name] went to [facility] ED yesterday. I called the patient back and asked patient to disregard my first message. She doesn't need a covid test and she is still scheduled for her procedure Monday.

## 2020-12-08 NOTE — H&P (View-Only) (Signed)
Reason for Consult: IDA Referring Physician: [facility]  [name] HPI: This is a 75 year old female with a PMH of HTN admitted for progressive weakness and hyponatremia.  Since her hospitalization late last year for repair of her radial fractures her mentation markedly declined.  Before October last year she was in good health and she was independent and taking care of young children.  Work up with Neurology did not reveal any source of dementia or any other abnormalities.  During this work up she was noted to have [place].  Compared to March 30/2022 her HGB dropped down to 10.4 g/dL on 10/06/2020.  Upon admission her HGB is now noted to abe at 8.5.  She tested negative twice with hemoccult and she was scheduled to have a colonoscopy +/- EGD on 12/11/2020 with [facility]. Her iron panel shows that her iron saturation is at 9% with a ferritin at 496.  The platelet count is elevated at 686.  The latest albumin also shows that it is at 1.6.  Her daughter does report that she has problems with constipation.  The patient's husband at times will have to help her "squeeze" out the stool.  The constipation started before the onset of these issues.  In fact, the CT scan in February 2022 was negative for any acute abnormalities.  It was performed for her complaints of constipation and abnormal liver enzymes.  The findings of the abnormal liver enzymes are new, but the elevations are mild.  Her daughter reports that since the mental status change her mother needs 24 hour care.  Past Medical History:  Diagnosis Date  . Depression   . High cholesterol   . Hypertension   . Retinal micro-aneurysm of right eye     Past Surgical History:  Procedure Laterality Date  . EYE SURGERY    . FINGER SURGERY Left    RING FINGER  . OPEN REDUCTION INTERNAL FIXATION (ORIF) DISTAL RADIAL FRACTURE Left 10/05/2020   Procedure: OPEN REDUCTION INTERNAL FIXATION (ORIF) DISTAL RADIAL FRACTURE;  Surgeon: Shona Needles,  MD;  Location: [facility];  Service: Orthopedics;  Laterality: Left;  supraclavicular block  . TUBAL LIGATION      Family History  Problem Relation Age of Onset  . Hyperlipidemia Mother   . Hypertension Mother   . Diabetes Mother   . COPD Father   . Depression Sister   . Drug abuse Sister   . Suicidality Brother   . Colon cancer Neg Hx   . Celiac disease Neg Hx   . Inflammatory bowel disease Neg Hx     Social History:  reports that she has quit smoking. Her smoking use included cigarettes. She has never used smokeless tobacco. She reports that she does not drink alcohol and does not use drugs.  Allergies:  Allergies  Allergen Reactions  . Aldomet [Methyldopa] Other (See Comments)    flu  . Fosamax [Alendronate Sodium]   . Acetazolamide Rash  . Buspar [Buspirone] Palpitations    Medications:  Scheduled: . timolol  1 drop Both Eyes BID   And  . brimonidine  1 drop Both Eyes BID  . dorzolamide  1 drop Both Eyes BID  . escitalopram  20 mg Oral Daily  . fluorometholone  1 drop Right Eye BID  . ketorolac  1 drop Right Eye Daily  . latanoprost  1 drop Both Eyes QHS  . linaclotide  145 mcg Oral QAC breakfast  . Netarsudil Dimesylate  1 drop  Both Eyes BID  . polyethylene glycol-electrolytes  4,000 mL Oral Once  . sodium chloride flush  3 mL Intravenous Q12H   Continuous: . sodium chloride      Results for orders placed or performed during the hospital encounter of 12/07/20 (from the past 24 hour(s))  Basic metabolic panel     Status: Abnormal   Collection Time: 12/07/20  4:33 PM  Result Value Ref Range   Sodium 125 (L) 135 - 145 mmol/L   Potassium 4.2 3.5 - 5.1 mmol/L   Chloride 92 (L) 98 - 111 mmol/L   CO2 25 22 - 32 mmol/L   Glucose, Bld 103 (H) 70 - 99 mg/dL   BUN 10 8 - 23 mg/dL   Creatinine, Ser 0.34 (L) 0.44 - 1.00 mg/dL   Calcium 8.6 (L) 8.9 - 10.3 mg/dL   GFR, Estimated >60 >60 mL/min   Anion gap 8 5 - 15  CBC     Status: Abnormal   Collection Time: 12/07/20   4:33 PM  Result Value Ref Range   WBC 11.3 (H) 4.0 - 10.5 K/uL   RBC 3.65 (L) 3.87 - 5.11 MIL/uL   Hemoglobin 8.5 (L) 12.0 - 15.0 g/dL   HCT 29.0 (L) 36.0 - 46.0 %   MCV 79.5 (L) 80.0 - 100.0 fL   MCH 23.3 (L) 26.0 - 34.0 pg   MCHC 29.3 (L) 30.0 - 36.0 g/dL   RDW 15.2 11.5 - 15.5 %   Platelets 686 (H) 150 - 400 K/uL   nRBC 0.0 0.0 - 0.2 %  Urinalysis, Routine w reflex microscopic Urine, Catheterized     Status: Abnormal   Collection Time: 12/08/20  1:29 AM  Result Value Ref Range   Color, Urine YELLOW YELLOW   APPearance HAZY (A) CLEAR   Specific Gravity, Urine 1.019 1.005 - 1.030   pH 6.0 5.0 - 8.0   Glucose, UA NEGATIVE NEGATIVE mg/dL   Hgb urine dipstick NEGATIVE NEGATIVE   Bilirubin Urine NEGATIVE NEGATIVE   Ketones, ur NEGATIVE NEGATIVE mg/dL   Protein, ur NEGATIVE NEGATIVE mg/dL   Nitrite NEGATIVE NEGATIVE   Leukocytes,Ua NEGATIVE NEGATIVE  Troponin I (High Sensitivity)     Status: None   Collection Time: 12/08/20  2:28 AM  Result Value Ref Range   Troponin I (High Sensitivity) 5 <18 ng/L  Magnesium     Status: None   Collection Time: 12/08/20  2:28 AM  Result Value Ref Range   Magnesium 1.7 1.7 - 2.4 mg/dL  Resp Panel by RT-PCR (Flu A&B, Covid) Nasopharyngeal Swab     Status: None   Collection Time: 12/08/20  2:34 AM   Specimen: Nasopharyngeal Swab; Nasopharyngeal(NP) swabs in vial transport medium  Result Value Ref Range   SARS Coronavirus 2 by RT PCR NEGATIVE NEGATIVE   Influenza A by PCR NEGATIVE NEGATIVE   Influenza B by PCR NEGATIVE NEGATIVE  POC occult blood, ED     Status: None   Collection Time: 12/08/20  2:48 AM  Result Value Ref Range   Fecal Occult Bld NEGATIVE NEGATIVE  Troponin I (High Sensitivity)     Status: None   Collection Time: 12/08/20  6:06 AM  Result Value Ref Range   Troponin I (High Sensitivity) 5 <18 ng/L  Save Smear     Status: None   Collection Time: 12/08/20 11:12 AM  Result Value Ref Range   Smear Review SMEAR STAINED AND  AVAILABLE FOR REVIEW   Lactate dehydrogenase  Status: Abnormal   Collection Time: 12/08/20 11:12 AM  Result Value Ref Range   LDH 89 (L) 98 - 192 U/L  Hepatic function panel     Status: Abnormal   Collection Time: 12/08/20 11:12 AM  Result Value Ref Range   Total Protein 6.1 (L) 6.5 - 8.1 g/dL   Albumin 1.6 (L) 3.5 - 5.0 g/dL   AST 29 15 - 41 U/L   ALT 50 (H) 0 - 44 U/L   Alkaline Phosphatase 115 38 - 126 U/L   Total Bilirubin 0.4 0.3 - 1.2 mg/dL   Bilirubin, Direct <0.1 0.0 - 0.2 mg/dL   Indirect Bilirubin NOT CALCULATED 0.3 - 0.9 mg/dL  Prealbumin     Status: Abnormal   Collection Time: 12/08/20 11:12 AM  Result Value Ref Range   Prealbumin <5 (L) 18 - 38 mg/dL  Basic metabolic panel     Status: Abnormal   Collection Time: 12/08/20 11:12 AM  Result Value Ref Range   Sodium 129 (L) 135 - 145 mmol/L   Potassium 3.6 3.5 - 5.1 mmol/L   Chloride 97 (L) 98 - 111 mmol/L   CO2 24 22 - 32 mmol/L   Glucose, Bld 112 (H) 70 - 99 mg/dL   BUN 5 (L) 8 - 23 mg/dL   Creatinine, Ser <0.30 (L) 0.44 - 1.00 mg/dL   Calcium 8.3 (L) 8.9 - 10.3 mg/dL   GFR, Estimated NOT CALCULATED >60 mL/min   Anion gap 8 5 - 15  T4, free     Status: None   Collection Time: 12/08/20 11:12 AM  Result Value Ref Range   Free T4 0.91 0.61 - 1.12 ng/dL     DG Chest 1 View  Result Date: 12/08/2020 CLINICAL DATA:  Anemia, hypertension EXAM: CHEST  1 VIEW COMPARISON:  11/30/2020 FINDINGS: Cardiomegaly. Left base scarring. No effusions or edema. No acute bony abnormality. IMPRESSION: No active disease. Electronically Signed   By: Rolm Baptise M.D.   On: 12/08/2020 08:02   CT Head Wo Contrast  Result Date: 12/08/2020 CLINICAL DATA:  Facial trauma; hyponatremia, hypokalemia, anemia EXAM: CT HEAD WITHOUT CONTRAST TECHNIQUE: Contiguous axial images were obtained from the base of the skull through the vertex without intravenous contrast. Sagittal and coronal MPR images reconstructed from axial data set. COMPARISON:   01/10/2016 FINDINGS: Brain: Generalized atrophy. Normal ventricular morphology. No midline shift or mass effect. Small vessel chronic ischemic changes of deep cerebral white matter. Old BILATERAL cerebellar infarcts. No intracranial hemorrhage, mass lesion, or evidence of acute infarction. No extra-axial fluid collections. Vascular: No hyperdense vessels Skull: Demineralized but intact calvaria. Visualized facial bones appear intact. Sinuses/Orbits: Paranasal sinuses and mastoid air cells clear. Intraorbital soft tissue planes clear. Other: N/A IMPRESSION: Atrophy with small vessel chronic ischemic changes of deep cerebral white matter. Old BILATERAL cerebellar infarcts. No acute intracranial abnormalities. Electronically Signed   By: Lavonia Dana M.D.   On: 12/08/2020 08:12   ECHOCARDIOGRAM COMPLETE  Result Date: 12/08/2020    ECHOCARDIOGRAM REPORT   Patient Name:   Mckenzie [name] Date of Exam: 12/08/2020 Medical Rec #:  [id number]         Height:       65.0 in Accession #:    [id number]        Weight:       105.0 lb Date of Birth:  01-23-1946          BSA:          1.504 m Patient  Age:    30 years          BP:           147/64 mmHg Patient Gender: F                 HR:           85 bpm. Exam Location:  Inpatient Procedure: 2D Echo, Cardiac Doppler and Color Doppler Indications:    Cardiomegaly I51.7  History:        Patient has no prior history of Echocardiogram examinations.                 Risk Factors:Hypertension.  Sonographer:    Bernadene Person RDCS Referring Phys: [id number] RONDELL A SMITH IMPRESSIONS  1. Left ventricular ejection fraction, by estimation, is 65 to 70%. The left ventricle has normal function. The left ventricle has no regional wall motion abnormalities. Left ventricular diastolic parameters are consistent with Grade I diastolic dysfunction (impaired relaxation).  2. Right ventricular systolic function is normal. The right ventricular size is normal. There is normal pulmonary artery systolic  pressure. The estimated right ventricular systolic pressure is 16.0 mmHg.  3. Left atrial size was moderately dilated.  4. Diastolic free wall collapse.  5. A small pericardial effusion is present. The pericardial effusion is circumferential. There is no evidence of cardiac tamponade.  6. The mitral valve is grossly normal. Trivial mitral valve regurgitation.  7. The aortic valve is tricuspid. Aortic valve regurgitation is not visualized.  8. The inferior vena cava is normal in size with greater than 50% respiratory variability, suggesting right atrial pressure of 3 mmHg. Comparison(s): No prior Echocardiogram. FINDINGS  Left Ventricle: Left ventricular ejection fraction, by estimation, is 65 to 70%. The left ventricle has normal function. The left ventricle has no regional wall motion abnormalities. The left ventricular internal cavity size was normal in size. There is  no left ventricular hypertrophy. Left ventricular diastolic parameters are consistent with Grade I diastolic dysfunction (impaired relaxation). Indeterminate filling pressures. Right Ventricle: The right ventricular size is normal. No increase in right ventricular wall thickness. Right ventricular systolic function is normal. There is normal pulmonary artery systolic pressure. The tricuspid regurgitant velocity is 2.29 m/s, and  with an assumed right atrial pressure of 3 mmHg, the estimated right ventricular systolic pressure is 10.9 mmHg. Left Atrium: Left atrial size was moderately dilated. Right Atrium: Diastolic free wall collapse. Right atrial size was normal in size. Pericardium: A small pericardial effusion is present. The pericardial effusion is circumferential. There is diastolic collapse of the right atrial wall and excessive respiratory variation in the tricuspid valve spectral Doppler velocities. There is no evidence of cardiac tamponade. Mitral Valve: The mitral valve is grossly normal. Trivial mitral valve regurgitation. Tricuspid  Valve: The tricuspid valve is grossly normal. Tricuspid valve regurgitation is mild. Aortic Valve: The aortic valve is tricuspid. Aortic valve regurgitation is not visualized. Pulmonic Valve: The pulmonic valve was normal in structure. Pulmonic valve regurgitation is not visualized. Aorta: The aortic root and ascending aorta are structurally normal, with no evidence of dilitation. Venous: The inferior vena cava is normal in size with greater than 50% respiratory variability, suggesting right atrial pressure of 3 mmHg. IAS/Shunts: No atrial level shunt detected by color flow Doppler.  LEFT VENTRICLE PLAX 2D LVIDd:         4.70 cm  Diastology LVIDs:         2.90 cm  LV e' medial:    7.38  cm/s LV PW:         0.90 cm  LV E/e' medial:  11.6 LV IVS:        0.80 cm  LV e' lateral:   9.20 cm/s LVOT diam:     1.90 cm  LV E/e' lateral: 9.3 LV SV:         77 LV SV Index:   51 LVOT Area:     2.84 cm  RIGHT VENTRICLE RV S prime:     14.90 cm/s TAPSE (M-mode): 2.0 cm LEFT ATRIUM             Index       RIGHT ATRIUM           Index LA diam:        3.10 cm 2.06 cm/m  RA Area:     11.80 cm LA Vol (A2C):   63.5 ml 42.21 ml/m RA Volume:   23.40 ml  15.56 ml/m LA Vol (A4C):   57.5 ml 38.22 ml/m LA Biplane Vol: 63.3 ml 42.08 ml/m  AORTIC VALVE LVOT Vmax:   115.00 cm/s LVOT Vmean:  83.100 cm/s LVOT VTI:    0.271 m  AORTA Ao Root diam: 3.00 cm Ao Asc diam:  3.00 cm MITRAL VALVE               TRICUSPID VALVE MV Area (PHT): 3.99 cm    TR Peak grad:   21.0 mmHg MV Decel Time: 190 msec    TR Vmax:        229.00 cm/s MV E velocity: 85.90 cm/s MV A velocity: 95.10 cm/s  SHUNTS MV E/A ratio:  0.90        Systemic VTI:  0.27 m                            Systemic Diam: 1.90 cm Lyman Bishop MD Electronically signed by Lyman Bishop MD Signature Date/Time: 12/08/2020/12:22:01 PM    Final     ROS:  As stated above in the HPI otherwise negative.  Blood pressure (!) 147/64, pulse 84, temperature 99.1 F (37.3 C), temperature source Oral,  resp. rate 18, SpO2 98 %.    PE: Gen: NAD, Alert and Oriented HEENT:  [place]/AT, EOMI Neck: Supple, no LAD Lungs: CTA Bilaterally CV: RRR without M/G/R ABD: Soft, NTND, +BS Ext: No C/C/E  Assessment/Plan: 1) IDA. 2) AMS. 3) Constipation. 4) Hypoalbuminemia. 5) Abnormal liver enzymes. 6) Hyponatremia - improved.   There is a clear IDA, but the most recent CT scan does not show any acute abnormalities.  It is not clear why she is exhibiting a number of metabolic abnormalities.  Her albumin is quite low and she has lost weight when comparing an image in Epic with her current presentation.  It is reasonable to investigate the IDA, but it is most likely not the source of her current issues.  With her around-the-clock care at home and difficulties with ambulation it is best to perform an EGD/colonoscopy in the hospital.  Plan: 1) EGD/colonoscopy tomorrow with Dr. Carlean Purl. 2) Further recommendations pending the findings.  Momo Braun D 12/08/2020, 2:55 PM

## 2020-12-08 NOTE — ED Notes (Signed)
Patient placed in gown, assisted with pivoting to [facility], daughter at bedside, pericare and skin care given. Patient eating crackers and drinking water without difficulty.

## 2020-12-08 NOTE — Consult Note (Signed)
Reason for Consult: IDA Referring Physician: [facility]  [name]: This is a 75 year old female with a PMH of HTN admitted for progressive weakness and hyponatremia.  Since her hospitalization late last year for repair of her radial fractures her mentation markedly declined.  Before October last year she was in good health and she was independent and taking care of young children.  Work up with Neurology did not reveal any source of dementia or any other abnormalities.  During this work up she was noted to have [place].  Compared to March 30/2022 her HGB dropped down to 10.4 g/dL on 10/06/2020.  Upon admission her HGB is now noted to abe at 8.5.  She tested negative twice with hemoccult and she was scheduled to have a colonoscopy +/- EGD on 12/11/2020 with [facility]. Her iron panel shows that her iron saturation is at 9% with a ferritin at 496.  The platelet count is elevated at 686.  The latest albumin also shows that it is at 1.6.  Her daughter does report that she has problems with constipation.  The patient's husband at times will have to help her "squeeze" out the stool.  The constipation started before the onset of these issues.  In fact, the CT scan in February 2022 was negative for any acute abnormalities.  It was performed for her complaints of constipation and abnormal liver enzymes.  The findings of the abnormal liver enzymes are new, but the elevations are mild.  Her daughter reports that since the mental status change her mother needs 24 hour care.  Past Medical History:  Diagnosis Date  . Depression   . High cholesterol   . Hypertension   . Retinal micro-aneurysm of right eye     Past Surgical History:  Procedure Laterality Date  . EYE SURGERY    . FINGER SURGERY Left    RING FINGER  . OPEN REDUCTION INTERNAL FIXATION (ORIF) DISTAL RADIAL FRACTURE Left 10/05/2020   Procedure: OPEN REDUCTION INTERNAL FIXATION (ORIF) DISTAL RADIAL FRACTURE;  Surgeon: Shona Needles,  MD;  Location: [facility];  Service: Orthopedics;  Laterality: Left;  supraclavicular block  . TUBAL LIGATION      Family History  Problem Relation Age of Onset  . Hyperlipidemia Mother   . Hypertension Mother   . Diabetes Mother   . COPD Father   . Depression Sister   . Drug abuse Sister   . Suicidality Brother   . Colon cancer Neg Hx   . Celiac disease Neg Hx   . Inflammatory bowel disease Neg Hx     Social History:  reports that she has quit smoking. Her smoking use included cigarettes. She has never used smokeless tobacco. She reports that she does not drink alcohol and does not use drugs.  Allergies:  Allergies  Allergen Reactions  . Aldomet [Methyldopa] Other (See Comments)    flu  . Fosamax [Alendronate Sodium]   . Acetazolamide Rash  . Buspar [Buspirone] Palpitations    Medications:  Scheduled: . timolol  1 drop Both Eyes BID   And  . brimonidine  1 drop Both Eyes BID  . dorzolamide  1 drop Both Eyes BID  . escitalopram  20 mg Oral Daily  . fluorometholone  1 drop Right Eye BID  . ketorolac  1 drop Right Eye Daily  . latanoprost  1 drop Both Eyes QHS  . linaclotide  145 mcg Oral QAC breakfast  . Netarsudil Dimesylate  1 drop  Both Eyes BID  . polyethylene glycol-electrolytes  4,000 mL Oral Once  . sodium chloride flush  3 mL Intravenous Q12H   Continuous: . sodium chloride      Results for orders placed or performed during the hospital encounter of 12/07/20 (from the past 24 hour(s))  Basic metabolic panel     Status: Abnormal   Collection Time: 12/07/20  4:33 PM  Result Value Ref Range   Sodium 125 (L) 135 - 145 mmol/L   Potassium 4.2 3.5 - 5.1 mmol/L   Chloride 92 (L) 98 - 111 mmol/L   CO2 25 22 - 32 mmol/L   Glucose, Bld 103 (H) 70 - 99 mg/dL   BUN 10 8 - 23 mg/dL   Creatinine, Ser 0.34 (L) 0.44 - 1.00 mg/dL   Calcium 8.6 (L) 8.9 - 10.3 mg/dL   GFR, Estimated >60 >60 mL/min   Anion gap 8 5 - 15  CBC     Status: Abnormal   Collection Time: 12/07/20   4:33 PM  Result Value Ref Range   WBC 11.3 (H) 4.0 - 10.5 K/uL   RBC 3.65 (L) 3.87 - 5.11 MIL/uL   Hemoglobin 8.5 (L) 12.0 - 15.0 g/dL   HCT 29.0 (L) 36.0 - 46.0 %   MCV 79.5 (L) 80.0 - 100.0 fL   MCH 23.3 (L) 26.0 - 34.0 pg   MCHC 29.3 (L) 30.0 - 36.0 g/dL   RDW 15.2 11.5 - 15.5 %   Platelets 686 (H) 150 - 400 K/uL   nRBC 0.0 0.0 - 0.2 %  Urinalysis, Routine w reflex microscopic Urine, Catheterized     Status: Abnormal   Collection Time: 12/08/20  1:29 AM  Result Value Ref Range   Color, Urine YELLOW YELLOW   APPearance HAZY (A) CLEAR   Specific Gravity, Urine 1.019 1.005 - 1.030   pH 6.0 5.0 - 8.0   Glucose, UA NEGATIVE NEGATIVE mg/dL   Hgb urine dipstick NEGATIVE NEGATIVE   Bilirubin Urine NEGATIVE NEGATIVE   Ketones, ur NEGATIVE NEGATIVE mg/dL   Protein, ur NEGATIVE NEGATIVE mg/dL   Nitrite NEGATIVE NEGATIVE   Leukocytes,Ua NEGATIVE NEGATIVE  Troponin I (High Sensitivity)     Status: None   Collection Time: 12/08/20  2:28 AM  Result Value Ref Range   Troponin I (High Sensitivity) 5 <18 ng/L  Magnesium     Status: None   Collection Time: 12/08/20  2:28 AM  Result Value Ref Range   Magnesium 1.7 1.7 - 2.4 mg/dL  Resp Panel by RT-PCR (Flu A&B, Covid) Nasopharyngeal Swab     Status: None   Collection Time: 12/08/20  2:34 AM   Specimen: Nasopharyngeal Swab; Nasopharyngeal(NP) swabs in vial transport medium  Result Value Ref Range   SARS Coronavirus 2 by RT PCR NEGATIVE NEGATIVE   Influenza A by PCR NEGATIVE NEGATIVE   Influenza B by PCR NEGATIVE NEGATIVE  POC occult blood, ED     Status: None   Collection Time: 12/08/20  2:48 AM  Result Value Ref Range   Fecal Occult Bld NEGATIVE NEGATIVE  Troponin I (High Sensitivity)     Status: None   Collection Time: 12/08/20  6:06 AM  Result Value Ref Range   Troponin I (High Sensitivity) 5 <18 ng/L  Save Smear     Status: None   Collection Time: 12/08/20 11:12 AM  Result Value Ref Range   Smear Review SMEAR STAINED AND  AVAILABLE FOR REVIEW   Lactate dehydrogenase  Status: Abnormal   Collection Time: 12/08/20 11:12 AM  Result Value Ref Range   LDH 89 (L) 98 - 192 U/L  Hepatic function panel     Status: Abnormal   Collection Time: 12/08/20 11:12 AM  Result Value Ref Range   Total Protein 6.1 (L) 6.5 - 8.1 g/dL   Albumin 1.6 (L) 3.5 - 5.0 g/dL   AST 29 15 - 41 U/L   ALT 50 (H) 0 - 44 U/L   Alkaline Phosphatase 115 38 - 126 U/L   Total Bilirubin 0.4 0.3 - 1.2 mg/dL   Bilirubin, Direct <0.1 0.0 - 0.2 mg/dL   Indirect Bilirubin NOT CALCULATED 0.3 - 0.9 mg/dL  Prealbumin     Status: Abnormal   Collection Time: 12/08/20 11:12 AM  Result Value Ref Range   Prealbumin <5 (L) 18 - 38 mg/dL  Basic metabolic panel     Status: Abnormal   Collection Time: 12/08/20 11:12 AM  Result Value Ref Range   Sodium 129 (L) 135 - 145 mmol/L   Potassium 3.6 3.5 - 5.1 mmol/L   Chloride 97 (L) 98 - 111 mmol/L   CO2 24 22 - 32 mmol/L   Glucose, Bld 112 (H) 70 - 99 mg/dL   BUN 5 (L) 8 - 23 mg/dL   Creatinine, Ser <0.30 (L) 0.44 - 1.00 mg/dL   Calcium 8.3 (L) 8.9 - 10.3 mg/dL   GFR, Estimated NOT CALCULATED >60 mL/min   Anion gap 8 5 - 15  T4, free     Status: None   Collection Time: 12/08/20 11:12 AM  Result Value Ref Range   Free T4 0.91 0.61 - 1.12 ng/dL     DG Chest 1 View  Result Date: 12/08/2020 CLINICAL DATA:  Anemia, hypertension EXAM: CHEST  1 VIEW COMPARISON:  11/30/2020 FINDINGS: Cardiomegaly. Left base scarring. No effusions or edema. No acute bony abnormality. IMPRESSION: No active disease. Electronically Signed   By: Rolm Baptise M.D.   On: 12/08/2020 08:02   CT Head Wo Contrast  Result Date: 12/08/2020 CLINICAL DATA:  Facial trauma; hyponatremia, hypokalemia, anemia EXAM: CT HEAD WITHOUT CONTRAST TECHNIQUE: Contiguous axial images were obtained from the base of the skull through the vertex without intravenous contrast. Sagittal and coronal MPR images reconstructed from axial data set. COMPARISON:   01/10/2016 FINDINGS: Brain: Generalized atrophy. Normal ventricular morphology. No midline shift or mass effect. Small vessel chronic ischemic changes of deep cerebral white matter. Old BILATERAL cerebellar infarcts. No intracranial hemorrhage, mass lesion, or evidence of acute infarction. No extra-axial fluid collections. Vascular: No hyperdense vessels Skull: Demineralized but intact calvaria. Visualized facial bones appear intact. Sinuses/Orbits: Paranasal sinuses and mastoid air cells clear. Intraorbital soft tissue planes clear. Other: N/A IMPRESSION: Atrophy with small vessel chronic ischemic changes of deep cerebral white matter. Old BILATERAL cerebellar infarcts. No acute intracranial abnormalities. Electronically Signed   By: Lavonia Dana M.D.   On: 12/08/2020 08:12   ECHOCARDIOGRAM COMPLETE  Result Date: 12/08/2020    ECHOCARDIOGRAM REPORT   Patient Name:   Mckenzie [name] Date of Exam: 12/08/2020 Medical Rec #:  [id number]         Height:       65.0 in Accession #:    [id number]        Weight:       105.0 lb Date of Birth:  01-31-1946          BSA:          1.504 m Patient  Age:    58 years          BP:           147/64 mmHg Patient Gender: F                 HR:           85 bpm. Exam Location:  Inpatient Procedure: 2D Echo, Cardiac Doppler and Color Doppler Indications:    Cardiomegaly I51.7  History:        Patient has no prior history of Echocardiogram examinations.                 Risk Factors:Hypertension.  Sonographer:    Bernadene Person RDCS Referring Phys: [id number] RONDELL A SMITH IMPRESSIONS  1. Left ventricular ejection fraction, by estimation, is 65 to 70%. The left ventricle has normal function. The left ventricle has no regional wall motion abnormalities. Left ventricular diastolic parameters are consistent with Grade I diastolic dysfunction (impaired relaxation).  2. Right ventricular systolic function is normal. The right ventricular size is normal. There is normal pulmonary artery systolic  pressure. The estimated right ventricular systolic pressure is 29.7 mmHg.  3. Left atrial size was moderately dilated.  4. Diastolic free wall collapse.  5. A small pericardial effusion is present. The pericardial effusion is circumferential. There is no evidence of cardiac tamponade.  6. The mitral valve is grossly normal. Trivial mitral valve regurgitation.  7. The aortic valve is tricuspid. Aortic valve regurgitation is not visualized.  8. The inferior vena cava is normal in size with greater than 50% respiratory variability, suggesting right atrial pressure of 3 mmHg. Comparison(s): No prior Echocardiogram. FINDINGS  Left Ventricle: Left ventricular ejection fraction, by estimation, is 65 to 70%. The left ventricle has normal function. The left ventricle has no regional wall motion abnormalities. The left ventricular internal cavity size was normal in size. There is  no left ventricular hypertrophy. Left ventricular diastolic parameters are consistent with Grade I diastolic dysfunction (impaired relaxation). Indeterminate filling pressures. Right Ventricle: The right ventricular size is normal. No increase in right ventricular wall thickness. Right ventricular systolic function is normal. There is normal pulmonary artery systolic pressure. The tricuspid regurgitant velocity is 2.29 m/s, and  with an assumed right atrial pressure of 3 mmHg, the estimated right ventricular systolic pressure is 98.9 mmHg. Left Atrium: Left atrial size was moderately dilated. Right Atrium: Diastolic free wall collapse. Right atrial size was normal in size. Pericardium: A small pericardial effusion is present. The pericardial effusion is circumferential. There is diastolic collapse of the right atrial wall and excessive respiratory variation in the tricuspid valve spectral Doppler velocities. There is no evidence of cardiac tamponade. Mitral Valve: The mitral valve is grossly normal. Trivial mitral valve regurgitation. Tricuspid  Valve: The tricuspid valve is grossly normal. Tricuspid valve regurgitation is mild. Aortic Valve: The aortic valve is tricuspid. Aortic valve regurgitation is not visualized. Pulmonic Valve: The pulmonic valve was normal in structure. Pulmonic valve regurgitation is not visualized. Aorta: The aortic root and ascending aorta are structurally normal, with no evidence of dilitation. Venous: The inferior vena cava is normal in size with greater than 50% respiratory variability, suggesting right atrial pressure of 3 mmHg. IAS/Shunts: No atrial level shunt detected by color flow Doppler.  LEFT VENTRICLE PLAX 2D LVIDd:         4.70 cm  Diastology LVIDs:         2.90 cm  LV e' medial:    7.38  cm/s LV PW:         0.90 cm  LV E/e' medial:  11.6 LV IVS:        0.80 cm  LV e' lateral:   9.20 cm/s LVOT diam:     1.90 cm  LV E/e' lateral: 9.3 LV SV:         77 LV SV Index:   51 LVOT Area:     2.84 cm  RIGHT VENTRICLE RV S prime:     14.90 cm/s TAPSE (M-mode): 2.0 cm LEFT ATRIUM             Index       RIGHT ATRIUM           Index LA diam:        3.10 cm 2.06 cm/m  RA Area:     11.80 cm LA Vol (A2C):   63.5 ml 42.21 ml/m RA Volume:   23.40 ml  15.56 ml/m LA Vol (A4C):   57.5 ml 38.22 ml/m LA Biplane Vol: 63.3 ml 42.08 ml/m  AORTIC VALVE LVOT Vmax:   115.00 cm/s LVOT Vmean:  83.100 cm/s LVOT VTI:    0.271 m  AORTA Ao Root diam: 3.00 cm Ao Asc diam:  3.00 cm MITRAL VALVE               TRICUSPID VALVE MV Area (PHT): 3.99 cm    TR Peak grad:   21.0 mmHg MV Decel Time: 190 msec    TR Vmax:        229.00 cm/s MV E velocity: 85.90 cm/s MV A velocity: 95.10 cm/s  SHUNTS MV E/A ratio:  0.90        Systemic VTI:  0.27 m                            Systemic Diam: 1.90 cm Lyman Bishop MD Electronically signed by Lyman Bishop MD Signature Date/Time: 12/08/2020/12:22:01 PM    Final     ROS:  As stated above in the HPI otherwise negative.  Blood pressure (!) 147/64, pulse 84, temperature 99.1 F (37.3 C), temperature source Oral,  resp. rate 18, SpO2 98 %.    PE: Gen: NAD, Alert and Oriented HEENT:  [place]/AT, EOMI Neck: Supple, no LAD Lungs: CTA Bilaterally CV: RRR without M/G/R ABD: Soft, NTND, +BS Ext: No C/C/E  Assessment/Plan: 1) IDA. 2) AMS. 3) Constipation. 4) Hypoalbuminemia. 5) Abnormal liver enzymes. 6) Hyponatremia - improved.   There is a clear IDA, but the most recent CT scan does not show any acute abnormalities.  It is not clear why she is exhibiting a number of metabolic abnormalities.  Her albumin is quite low and she has lost weight when comparing an image in Epic with her current presentation.  It is reasonable to investigate the IDA, but it is most likely not the source of her current issues.  With her around-the-clock care at home and difficulties with ambulation it is best to perform an EGD/colonoscopy in the hospital.  Plan: 1) EGD/colonoscopy tomorrow with Dr. Carlean Purl. 2) Further recommendations pending the findings.  HUNG,PATRICK D 12/08/2020, 2:55 PM

## 2020-12-08 NOTE — H&P (Addendum)
History and Physical    Mckenzie [name] YPP:[id number] DOB: 1946-02-15 DOA: 12/07/2020  Referring MD/NP/PA: April Palombo, MD PCP: Loman Brooklyn, FNP  Consultants: Dr. Gladstone Lighter- orthopedics Dr. Sater-neurology Patient coming from: Home  Chief Complaint: Low blood counts and low sodium levels  I have personally briefly reviewed patient's old medical records in [place]   HPI: Mckenzie [name] is a 75 y.o. female with medical history significant of hypertension, hyperlipidemia, anxiety, and depression presents after being advised by her primary care provider to come in for low blood counts and low sodium levels.  Patient's daughter is present at bedside and provides much of the patient's history.  Apparently over the last 3 months patient has been dealing with weakness and confusion.  Last hospitalized on 10/05/2020 after having a fall status post open reduction internal fixation of a left distal radial fracture.  Since that hospitalization patient daughter notes that she has had a progressive decline and now needs assistance to complete pretty much all of her ADLs.  It seems she was started on Lexapro 2 months ago for adjustment disorder with anxiety and depression.  Daughter notes that the medication has been helping and would like to continue this medication possible..  She has been being followed by Dr. Allyson Sabal of neurology for worsening confusion and weakness.  Patient had been worked up for for her symptoms and had been told that she did not have dementia.  Also reports patient having EMG studies that were negative.  During this time patient sodium levels have been trending down and hemoglobin as well.  Patient denies any reports of blood in stool and she has been on ferrous sulfate supplements.  She had followed up with [facility] and was scheduled to have a colonoscopy on 3/7.  Directed to stop taking ferrous sulfate and take Linzess prior to the procedure.  Family is concerned that  she has an underlying malignancy.  In regards to her sodium levels PCP reportedly was concerned for SIADH and seemed to think Lexapro was less likely to blame.  Her daughter states that she has been eating well, but they have to make sure that she eats.  ED Course: Upon admission to the emergency department patient was seen to be afebrile with temperature 99.1 F, blood pressure 148/59-178/59, and all other vital signs maintained.  Labs significant for WBC 11.3, hemoglobin 8.5, platelets 686, sodium 125, chloride 92, calcium 8.6, and troponins negative x2.  Urinalysis negative for any signs of infection.  Patient had been given 500 mL fluid bolus, and then placed on IV fluids of 300 mL/h.  TRH called to admit.  Review of Systems  Constitutional: Positive for malaise/fatigue. Negative for fever.  HENT: Negative for congestion and sinus pain.   Eyes: Negative for pain.  Respiratory: Negative for cough and shortness of breath.   Cardiovascular: Negative for chest pain and leg swelling.  Gastrointestinal: Negative for abdominal pain, nausea and vomiting.  Genitourinary: Negative for dysuria and hematuria.  Musculoskeletal: Positive for joint pain and myalgias. Negative for falls.  Skin: Negative for itching.  Neurological: Positive for weakness.  Psychiatric/Behavioral: Positive for memory loss. Negative for substance abuse.     Past Medical History:  Diagnosis Date  . Depression   . High cholesterol   . Hypertension   . Retinal micro-aneurysm of right eye     Past Surgical History:  Procedure Laterality Date  . EYE SURGERY    . FINGER SURGERY Left  RING FINGER  . OPEN REDUCTION INTERNAL FIXATION (ORIF) DISTAL RADIAL FRACTURE Left 10/05/2020   Procedure: OPEN REDUCTION INTERNAL FIXATION (ORIF) DISTAL RADIAL FRACTURE;  Surgeon: Shona Needles, MD;  Location: [facility];  Service: Orthopedics;  Laterality: Left;  supraclavicular block  . TUBAL LIGATION       reports that she has quit  smoking. Her smoking use included cigarettes. She has never used smokeless tobacco. She reports that she does not drink alcohol and does not use drugs.  Allergies  Allergen Reactions  . Aldomet [Methyldopa] Other (See Comments)    flu  . Fosamax [Alendronate Sodium]   . Acetazolamide Rash  . Buspar [Buspirone] Palpitations    Family History  Problem Relation Age of Onset  . Hyperlipidemia Mother   . Hypertension Mother   . Diabetes Mother   . COPD Father   . Depression Sister   . Drug abuse Sister   . Suicidality Brother   . Colon cancer Neg Hx   . Celiac disease Neg Hx   . Inflammatory bowel disease Neg Hx     Prior to Admission medications   Medication Sig Start Date End Date Taking? Authorizing Provider  COMBIGAN 0.2-0.5 % ophthalmic solution Place 1 drop into both eyes every 12 (twelve) hours. 12/01/13  Yes [provider]  dorzolamide (TRUSOPT) 2 % ophthalmic solution Place 1 drop into both eyes 2 (two) times daily.   Yes [provider]  escitalopram (LEXAPRO) 20 MG tablet TAKE 1/2 (ONE-HALF) TABLET BY MOUTH TWICE DAILY Patient taking differently: Take 20 mg by mouth daily. 11/16/20  Yes Sater, Nanine Means, MD  ferrous sulfate 325 (65 FE) MG tablet Take 325 mg by mouth 2 (two) times daily with a meal.   Yes [provider]  fluorometholone (FML) 0.1 % ophthalmic suspension Place 1 drop into the right eye 2 (two) times daily.   Yes [provider]  hydroxypropyl methylcellulose / hypromellose (ISOPTO TEARS / GONIOVISC) 2.5 % ophthalmic solution Place 1 drop into both eyes 3 (three) times daily as needed for dry eyes.   Yes [provider]  lubiprostone (AMITIZA) 8 MCG capsule Take one cap with food once or twice daily to maintain regular bowel movements. Patient taking differently: Take 8 mcg by mouth See admin instructions. Take one cap with food once or twice daily to maintain regular bowel movements. 12/06/20  Yes Mahala Menghini, PA-C   PROLENSA 0.07 % SOLN Place 1 drop into the right eye daily. 12/14/13  Yes [provider]  Vitamin D, Ergocalciferol, (DRISDOL) 1.25 MG (50000 UNIT) CAPS capsule Take 1 capsule (50,000 Units total) by mouth every 7 (seven) days. Patient taking differently: Take 50,000 Units by mouth every Thursday. 11/14/20  Yes Sater, Nanine Means, MD  latanoprost (XALATAN) 0.005 % ophthalmic solution Place 1 drop into both eyes at bedtime. 12/29/15   [provider]  Netarsudil Dimesylate (RHOPRESSA) 0.02 % SOLN Place 1 drop into both eyes in the morning and at bedtime.    [provider]    Physical Exam:  Constitutional: Frail elderly female currently in no acute distress Vitals:   12/08/20 0345 12/08/20 0445 12/08/20 0600 12/08/20 0645  BP: (!) 166/85 (!) 162/59 (!) 162/74 (!) 154/56  Pulse: 83 79 77 79  Resp: 17 17 13 13   Temp:      TempSrc:      SpO2: 99% 97% 96% 98%   Eyes: PERRL, lids and conjunctivae normal ENMT: Mucous membranes are dry. Posterior  pharynx clear of any exudate or lesions.  Neck: normal, supple, no masses, no thyromegaly Respiratory: clear to auscultation bilaterally, no wheezing, no crackles. Normal respiratory effort. No accessory muscle use.  Cardiovascular: Regular rate and rhythm, no murmurs / rubs / gallops. No extremity edema. 2+ pedal pulses. No carotid bruits.  Abdomen: no tenderness, no masses palpated. No hepatosplenomegaly. Bowel sounds positive.  Musculoskeletal: no clubbing / cyanosis. No joint deformity upper and lower extremities. Good ROM, no contractures. Normal muscle tone.  Skin: no rashes, lesions, ulcers. No induration Neurologic: CN 2-12 grossly intact. Sensation intact, DTR normal. Strength 4/5 in all 4.  Psychiatric: Normal judgment and insight. Alert and oriented x 3.  Flat affect.  Labs on Admission: I have personally reviewed following labs and imaging studies  CBC: Recent Labs  Lab 12/01/20 1127 12/06/20 1341  12/07/20 1633  WBC 12.0* 11.0* 11.3*  NEUTROABS 9.4* 8.9*  --   HGB 9.6* 8.7* 8.5*  HCT 30.4* 28.4* 29.0*  MCV 78* 79 79.5*  PLT 738* 666* 751*   Basic Metabolic Panel: Recent Labs  Lab 12/01/20 1127 12/06/20 1341 12/07/20 1633 12/08/20 0228  NA 129* 126* 125*  --   K 4.9 5.2 4.2  --   CL 92* 91* 92*  --   CO2 22 22 25   --   GLUCOSE 124* 113* 103*  --   BUN 8 10 10   --   CREATININE 0.38* 0.39* 0.34*  --   CALCIUM 9.1 9.1 8.6*  --   MG  --   --   --  1.7   GFR: Estimated Creatinine Clearance: 45.7 mL/min (A) (by C-G formula based on SCr of 0.34 mg/dL (L)). Liver Function Tests: Recent Labs  Lab 12/01/20 1127 12/06/20 1341  AST 33 42*  ALT 52* 60*  ALKPHOS 177* 163*  BILITOT 0.2 <0.2  PROT 7.2 7.2  ALBUMIN 2.8* 2.7*   No results for input(s): LIPASE, AMYLASE in the last 168 hours. No results for input(s): AMMONIA in the last 168 hours. Coagulation Profile: No results for input(s): INR, PROTIME in the last 168 hours. Cardiac Enzymes: No results for input(s): CKTOTAL, CKMB, CKMBINDEX, TROPONINI in the last 168 hours. BNP (last 3 results) No results for input(s): PROBNP in the last 8760 hours. HbA1C: No results for input(s): HGBA1C in the last 72 hours. CBG: No results for input(s): GLUCAP in the last 168 hours. Lipid Profile: No results for input(s): CHOL, HDL, LDLCALC, TRIG, CHOLHDL, LDLDIRECT in the last 72 hours. Thyroid Function Tests: No results for input(s): TSH, T4TOTAL, FREET4, T3FREE, THYROIDAB in the last 72 hours. Anemia Panel: Recent Labs    12/06/20 1341  FERRITIN 496*   Urine analysis:    Component Value Date/Time   COLORURINE YELLOW 12/08/2020 0129   APPEARANCEUR HAZY (A) 12/08/2020 0129   APPEARANCEUR Clear 11/21/2020 1644   LABSPEC 1.019 12/08/2020 0129   PHURINE 6.0 12/08/2020 0129   GLUCOSEU NEGATIVE 12/08/2020 0129   HGBUR NEGATIVE 12/08/2020 0129   BILIRUBINUR NEGATIVE 12/08/2020 0129   BILIRUBINUR Negative 11/21/2020 1644    KETONESUR NEGATIVE 12/08/2020 0129   PROTEINUR NEGATIVE 12/08/2020 0129   UROBILINOGEN negative 05/13/2015 0827   NITRITE NEGATIVE 12/08/2020 0129   LEUKOCYTESUR NEGATIVE 12/08/2020 0129   Sepsis Labs: Recent Results (from the past 240 hour(s))  Resp Panel by RT-PCR (Flu A&B, Covid) Nasopharyngeal Swab     Status: None   Collection Time: 12/08/20  2:34 AM   Specimen: Nasopharyngeal Swab; Nasopharyngeal(NP) swabs in vial transport medium  Result Value Ref Range Status   SARS Coronavirus 2 by RT PCR NEGATIVE NEGATIVE Final    Comment: (NOTE) SARS-CoV-2 target nucleic acids are NOT DETECTED.  The SARS-CoV-2 RNA is generally detectable in upper respiratory specimens during the acute phase of infection. The lowest concentration of SARS-CoV-2 viral copies this assay can detect is 138 copies/mL. A negative result does not preclude SARS-Cov-2 infection and should not be used as the sole basis for treatment or other patient management decisions. A negative result may occur with  improper specimen collection/handling, submission of specimen other than nasopharyngeal swab, presence of viral mutation(s) within the areas targeted by this assay, and inadequate number of viral copies(<138 copies/mL). A negative result must be combined with clinical observations, patient history, and epidemiological information. The expected result is Negative.  Fact Sheet for Patients:  EntrepreneurPulse.com.au  Fact Sheet for Healthcare Providers:  IncredibleEmployment.be  This test is no t yet approved or cleared by the [place] FDA and  has been authorized for detection and/or diagnosis of SARS-CoV-2 by FDA under an Emergency Use Authorization (EUA). This EUA will remain  in effect (meaning this test can be used) for the duration of the COVID-19 declaration under Section 564(b)(1) of the Act, 21 U.S.C.section 360bbb-3(b)(1), unless the authorization is terminated   or revoked sooner.       Influenza A by PCR NEGATIVE NEGATIVE Final   Influenza B by PCR NEGATIVE NEGATIVE Final    Comment: (NOTE) The Xpert Xpress SARS-CoV-2/FLU/RSV plus assay is intended as an aid in the diagnosis of influenza from Nasopharyngeal swab specimens and should not be used as a sole basis for treatment. Nasal washings and aspirates are unacceptable for Xpert Xpress SARS-CoV-2/FLU/RSV testing.  Fact Sheet for Patients: EntrepreneurPulse.com.au  Fact Sheet for Healthcare Providers: IncredibleEmployment.be  This test is not yet approved or cleared by the [place] FDA and has been authorized for detection and/or diagnosis of SARS-CoV-2 by FDA under an Emergency Use Authorization (EUA). This EUA will remain in effect (meaning this test can be used) for the duration of the COVID-19 declaration under Section 564(b)(1) of the Act, 21 U.S.C. section 360bbb-3(b)(1), unless the authorization is terminated or revoked.  Performed at [facility], [place] [address] [place], [place] [address]      Radiological Exams on Admission: DG Chest 1 View  Result Date: 12/08/2020 CLINICAL DATA:  Anemia, hypertension EXAM: CHEST  1 VIEW COMPARISON:  11/30/2020 FINDINGS: Cardiomegaly. Left base scarring. No effusions or edema. No acute bony abnormality. IMPRESSION: No active disease. Electronically Signed   By: Rolm Baptise M.D.   On: 12/08/2020 08:02   CT Head Wo Contrast  Result Date: 12/08/2020 CLINICAL DATA:  Facial trauma; hyponatremia, hypokalemia, anemia EXAM: CT HEAD WITHOUT CONTRAST TECHNIQUE: Contiguous axial images were obtained from the base of the skull through the vertex without intravenous contrast. Sagittal and coronal MPR images reconstructed from axial data set. COMPARISON:  01/10/2016 FINDINGS: Brain: Generalized atrophy. Normal ventricular morphology. No midline shift or mass effect. Small vessel chronic ischemic changes of  deep cerebral white matter. Old BILATERAL cerebellar infarcts. No intracranial hemorrhage, mass lesion, or evidence of acute infarction. No extra-axial fluid collections. Vascular: No hyperdense vessels Skull: Demineralized but intact calvaria. Visualized facial bones appear intact. Sinuses/Orbits: Paranasal sinuses and mastoid air cells clear. Intraorbital soft tissue planes clear. Other: N/A IMPRESSION: Atrophy with small vessel chronic ischemic changes of deep cerebral white matter. Old BILATERAL cerebellar infarcts. No acute intracranial abnormalities. Electronically Signed  By: Lavonia Dana M.D.   On: 12/08/2020 08:12    EKG: Independently reviewed.  Normal sinus rhythm 82 bpm  Assessment/Plan Hyponatremia: Acute on chronic.  Sodium levels trending down to 125 with chloride 92.  During patient's last admission back in December of 2021 she was noted to have sodium levels 134-135, but has trended down since that time.  Note patient was started on Lexapro in January for anxiety and depression.  Urinalysis had a normal specific gravity.  Differential currently includes medication of Lexapro versus hypovolemic hyponatremia versus SIADH. -Admit to a medical telemetry bed -Check urine sodium and urine osmolarity -Serial BMP monitoring -Changed normal saline IV fluids to 75 ml/hr to prevent cough fluid overload  Iron deficiency anemia: Acute on chronic.  Hemoglobin 8.5 with low MCV and MCH.  Blood counts have been slowly trending down.  Stool guaiacs negative.  Patient with recent iron studies on 2/25 noting iron 18, iron saturation 9, ferritin 553, and TIBC 198.  Patient is scheduled to have a colonoscopy via [facility] on 3/7. -Ferrous sulfate on hold for future colonoscopy -GI consulted for possible colonoscopy, we will follow for further recommendation  Leukocytosis: Acute.  WBC elevated 11.3.  Denies having any reports of fever or chills.  Chest x-ray and urinalysis without clear signs of  infection. -Continue to monitor  Cardiomegaly: Acute.  Chest x-ray noted cardiomegaly without signs of edema.  BNP was noted to be 213.71 on 2/25. -Strict intake and output -Daily weights -Follow-up echocardiogram  Proximal extremity weakness: Patient has been being worked up in the outpatient setting by Dr. Allyson Sabal of neurology and appears to have extensive work-up done at the end of last year thyroid studies were noted to be within normal limits.  Recently had normal EMG studies. -PT/OT to evaluate and treat  -Transitions of care consult  Memory loss:  Family reports patient has had intermittent confusion and memory loss.  She has been worked up by Dr. Allyson Sabal as well for this and negative for dementia. -Continue outpatient follow-up with Dr. Allyson Sabal  Thrombocytosis: Acute.  WBC elevated at 686.  Question if this is a reactive thrombocytosis. -Continue to monitor  Anxiety and depression: Patient's daughter states that since being started on Lexapro patient has been doing better.   -Continue Lexapro for now  Hypoalbuminemia: Acute on chronic.  1.6 on admission. -Check prealbumin -Consider starting patient on supplement once evaluation completed  Secondary glaucoma -Continue regimen of eyedrops  DVT prophylaxis: SCDs Code Status: Full Family Communication: Daughter updated at bedside Disposition Plan: To be determined Consults called: Dr. Tommy Medal GI Admission status: Inpatient   Norval Morton MD [facility]   If 7PM-7AM, please contact night-coverage   12/08/2020, 10:03 AM

## 2020-12-08 NOTE — ED Provider Notes (Signed)
[facility] EMERGENCY DEPARTMENT Provider Note   CSN: [id number] Arrival date & time: 12/07/20  1538     History Chief Complaint  Patient presents with  . Fatigue  . Generalized Body Aches    Mckenzie [name] is a 75 y.o. female.  The history is provided by the patient and a relative.  Weakness Severity:  Severe Onset quality:  Gradual Duration:  12 weeks Timing:  Constant Progression:  Worsening Chronicity:  New Context: recent infection   Context: not allergies, not decreased sleep, not drug use, not increased activity, not stress and not urinary tract infection   Relieved by:  Nothing Worsened by:  Nothing Ineffective treatments: irona therapy, neurology consult  Associated symptoms: difficulty walking   Associated symptoms: no abdominal pain, no anorexia, no arthralgias, no cough, no diarrhea, no dizziness, no dysuria, no fever, no foul-smelling urine, no loss of consciousness, no melena, no shortness of breath and no vomiting   Risk factors: anemia   Patient with global weakness, confusion and ambulatory dysfunction which is progressive.  Sent in for worsening anemia and hyponatremia.  Scheduled for colonoscopy next week.      Past Medical History:  Diagnosis Date  . Depression   . High cholesterol   . Hypertension   . Retinal micro-aneurysm of right eye     Patient Active Problem List   Diagnosis Date Noted  . Constipation 12/06/2020  . IDA (iron deficiency anemia) 12/06/2020  . Melena 12/06/2020  . Gait disturbance 11/14/2020  . 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 11/14/2020  . Cognitive change 11/14/2020  . Adjustment disorder with mixed anxiety and depressed mood 10/10/2020  . Fracture 10/06/2020  . Distal radius fracture 10/05/2020  . Acute metabolic encephalopathy 40/98/1191  . Proximal weakness of extremity 09/26/2020  . Myalgia 09/26/2020  . Fall at home, initial encounter 09/26/2020  . 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  Hypertension 01/11/2014  . Vertigo 01/11/2014  . Hyperlipidemia 01/11/2014    Past Surgical History:  Procedure Laterality Date  . EYE SURGERY    . FINGER SURGERY Left    RING FINGER  . OPEN REDUCTION INTERNAL FIXATION (ORIF) DISTAL RADIAL FRACTURE Left 10/05/2020   Procedure: OPEN REDUCTION INTERNAL FIXATION (ORIF) DISTAL RADIAL FRACTURE;  Surgeon: Shona Needles, MD;  Location: [facility];  Service: Orthopedics;  Laterality: Left;  supraclavicular block  . TUBAL LIGATION       OB History   No obstetric history on file.     Family History  Problem Relation Age of Onset  . Hyperlipidemia Mother   . Hypertension Mother   . Diabetes Mother   . COPD Father   . Depression Sister   . Drug abuse Sister   . Suicidality Brother   . Colon cancer Neg Hx   . Celiac disease Neg Hx   . Inflammatory bowel disease Neg Hx     Social History   Tobacco Use  . Smoking status: Former Smoker    Types: Cigarettes  . Smokeless tobacco: Never Used  . Tobacco comment: for a short period  Vaping Use  . Vaping Use: Never used  Substance Use Topics  . Alcohol use: No  . Drug use: No    Home Medications Prior to Admission medications   Medication Sig Start Date End Date Taking? Authorizing Provider  COMBIGAN 0.2-0.5 % ophthalmic solution Place 1 drop into both eyes every 12 (twelve) hours. 12/01/13  Yes [provider]  dorzolamide (TRUSOPT) 2 % ophthalmic solution Place  1 drop into both eyes 2 (two) times daily.   Yes [provider]  escitalopram (LEXAPRO) 20 MG tablet TAKE 1/2 (ONE-HALF) TABLET BY MOUTH TWICE DAILY Patient taking differently: Take 20 mg by mouth daily. 11/16/20  Yes Sater, Nanine Means, MD  ferrous sulfate 325 (65 FE) MG tablet Take 325 mg by mouth 2 (two) times daily with a meal.   Yes [provider]  fluorometholone (FML) 0.1 % ophthalmic suspension Place 1 drop into the right eye 2 (two) times daily.   Yes [provider]  hydroxypropyl  methylcellulose / hypromellose (ISOPTO TEARS / GONIOVISC) 2.5 % ophthalmic solution Place 1 drop into both eyes 3 (three) times daily as needed for dry eyes.   Yes [provider]  lubiprostone (AMITIZA) 8 MCG capsule Take one cap with food once or twice daily to maintain regular bowel movements. Patient taking differently: Take 8 mcg by mouth See admin instructions. Take one cap with food once or twice daily to maintain regular bowel movements. 12/06/20  Yes Mahala Menghini, PA-C  PROLENSA 0.07 % SOLN Place 1 drop into the right eye daily. 12/14/13  Yes [provider]  Vitamin D, Ergocalciferol, (DRISDOL) 1.25 MG (50000 UNIT) CAPS capsule Take 1 capsule (50,000 Units total) by mouth every 7 (seven) days. Patient taking differently: Take 50,000 Units by mouth every Thursday. 11/14/20  Yes Sater, Nanine Means, MD  latanoprost (XALATAN) 0.005 % ophthalmic solution Place 1 drop into both eyes at bedtime. 12/29/15   [provider]  Netarsudil Dimesylate (RHOPRESSA) 0.02 % SOLN Place 1 drop into both eyes in the morning and at bedtime.    [provider]    Allergies    Aldomet [methyldopa], Fosamax [alendronate sodium], Acetazolamide, and Buspar [buspirone]  Review of Systems   Review of Systems  Constitutional: Negative for fever.  HENT: Negative for congestion.   Respiratory: Negative for cough and shortness of breath.   Gastrointestinal: Negative for abdominal pain, anorexia, diarrhea, melena and vomiting.  Genitourinary: Negative for dysuria.  Musculoskeletal: Negative for arthralgias.  Skin: Negative for rash.  Neurological: Positive for weakness. Negative for dizziness and loss of consciousness.  Psychiatric/Behavioral: Negative for agitation.  All other systems reviewed and are negative.   Physical Exam Updated Vital Signs BP (!) 162/74   Pulse 77   Temp 99.1 F (37.3 C) (Oral)   Resp 13   SpO2 96%   Physical Exam Vitals and nursing note reviewed.   Constitutional:      General: She is not in acute distress.    Appearance: Normal appearance.  HENT:     Head: Normocephalic and atraumatic.     Nose: Nose normal.  Eyes:     Conjunctiva/sclera: Conjunctivae normal.     Pupils: Pupils are equal, round, and reactive to light.  Cardiovascular:     Rate and Rhythm: Normal rate and regular rhythm.     Pulses: Normal pulses.     Heart sounds: Normal heart sounds.  Pulmonary:     Effort: Pulmonary effort is normal.     Breath sounds: Normal breath sounds.  Abdominal:     General: Abdomen is flat. Bowel sounds are normal.     Palpations: Abdomen is soft.     Tenderness: There is no abdominal tenderness. There is no guarding.  Musculoskeletal:        General: Normal range of motion.     Cervical back: Normal range of motion and neck supple.  Skin:  General: Skin is warm and dry.     Capillary Refill: Capillary refill takes less than 2 seconds.  Neurological:     General: No focal deficit present.     Mental Status: She is alert and oriented to person, place, and time.     Cranial Nerves: No cranial nerve deficit.     Deep Tendon Reflexes: Reflexes normal.  Psychiatric:        Mood and Affect: Mood normal.     ED Results / Procedures / Treatments   Labs (all labs ordered are listed, but only abnormal results are displayed) Results for orders placed or performed during the hospital encounter of 12/07/20  Resp Panel by RT-PCR (Flu A&B, Covid) Nasopharyngeal Swab   Specimen: Nasopharyngeal Swab; Nasopharyngeal(NP) swabs in vial transport medium  Result Value Ref Range   SARS Coronavirus 2 by RT PCR NEGATIVE NEGATIVE   Influenza A by PCR NEGATIVE NEGATIVE   Influenza B by PCR NEGATIVE NEGATIVE  Basic metabolic panel  Result Value Ref Range   Sodium 125 (L) 135 - 145 mmol/L   Potassium 4.2 3.5 - 5.1 mmol/L   Chloride 92 (L) 98 - 111 mmol/L   CO2 25 22 - 32 mmol/L   Glucose, Bld 103 (H) 70 - 99 mg/dL   BUN 10 8 - 23 mg/dL    Creatinine, Ser 0.34 (L) 0.44 - 1.00 mg/dL   Calcium 8.6 (L) 8.9 - 10.3 mg/dL   GFR, Estimated >60 >60 mL/min   Anion gap 8 5 - 15  CBC  Result Value Ref Range   WBC 11.3 (H) 4.0 - 10.5 K/uL   RBC 3.65 (L) 3.87 - 5.11 MIL/uL   Hemoglobin 8.5 (L) 12.0 - 15.0 g/dL   HCT 29.0 (L) 36.0 - 46.0 %   MCV 79.5 (L) 80.0 - 100.0 fL   MCH 23.3 (L) 26.0 - 34.0 pg   MCHC 29.3 (L) 30.0 - 36.0 g/dL   RDW 15.2 11.5 - 15.5 %   Platelets 686 (H) 150 - 400 K/uL   nRBC 0.0 0.0 - 0.2 %  Urinalysis, Routine w reflex microscopic Urine, Catheterized  Result Value Ref Range   Color, Urine YELLOW YELLOW   APPearance HAZY (A) CLEAR   Specific Gravity, Urine 1.019 1.005 - 1.030   pH 6.0 5.0 - 8.0   Glucose, UA NEGATIVE NEGATIVE mg/dL   Hgb urine dipstick NEGATIVE NEGATIVE   Bilirubin Urine NEGATIVE NEGATIVE   Ketones, ur NEGATIVE NEGATIVE mg/dL   Protein, ur NEGATIVE NEGATIVE mg/dL   Nitrite NEGATIVE NEGATIVE   Leukocytes,Ua NEGATIVE NEGATIVE  Magnesium  Result Value Ref Range   Magnesium 1.7 1.7 - 2.4 mg/dL  POC occult blood, ED  Result Value Ref Range   Fecal Occult Bld NEGATIVE NEGATIVE  Troponin I (High Sensitivity)  Result Value Ref Range   Troponin I (High Sensitivity) 5 <18 ng/L   DG Chest 2 View  Result Date: 11/30/2020 CLINICAL DATA:  Confusion, hyponatremia EXAM: CHEST - 2 VIEW COMPARISON:  10/05/2020 FINDINGS: Frontal and lateral views of the chest demonstrate mild enlargement of the cardiac silhouette. No airspace disease, effusion, or pneumothorax. No acute bony abnormalities. IMPRESSION: 1. No acute intrathoracic process. Electronically Signed   By: Randa Ngo M.D.   On: 11/30/2020 15:57   CT Abdomen Pelvis W Contrast  Result Date: 11/30/2020 CLINICAL DATA:  Generalized abdominal pain. Constipation. Elevated liver function tests. EXAM: CT ABDOMEN AND PELVIS WITH CONTRAST TECHNIQUE: Multidetector CT imaging of the abdomen and pelvis  was performed using the standard protocol following  bolus administration of intravenous contrast. CONTRAST:  19mL OMNIPAQUE IOHEXOL 300 MG/ML  SOLN COMPARISON:  None. FINDINGS: Lower Chest: Small pericardial effusion and small left pleural effusion are noted. Hepatobiliary: No hepatic masses identified. A few tiny calcified gallstones are seen, however there is no evidence of cholecystitis or biliary ductal dilatation. Pancreas:  No mass or inflammatory changes. Spleen: Within normal limits in size and appearance. Adrenals/Urinary Tract: No masses identified. Small left renal cyst noted. No evidence of ureteral calculi or hydronephrosis. Stomach/Bowel: No evidence of obstruction, inflammatory process or abnormal fluid collections. Normal appendix visualized. Mild Diverticulosis is seen involving the sigmoid colon, however there is no evidence of diverticulitis. Vascular/Lymphatic: No pathologically enlarged lymph nodes. No abdominal aortic aneurysm. Aortic atherosclerotic calcification noted. Reproductive:  No mass or other significant abnormality. Other:  None. Musculoskeletal:  No suspicious bone lesions identified. IMPRESSION: No acute findings within the abdomen or pelvis. Cholelithiasis. No radiographic evidence of cholecystitis. Mild sigmoid diverticulosis. No radiographic evidence of diverticulitis. Small pericardial effusion and small left pleural effusion. Electronically Signed   By: Marlaine Hind M.D.   On: 11/30/2020 18:55    EKG EKG Interpretation  Date/Time:  Thursday December 07 2020 16:06:12 EST Ventricular Rate:  82 PR Interval:  150 QRS Duration: 78 QT Interval:  358 QTC Calculation: 418 R Axis:   64 Text Interpretation: Normal sinus rhythm Confirmed by Randal Buba, Kaelen Brennan ([id number]) on 12/08/2020 1:10:15 AM   Radiology No results found.  Procedures Procedures   Medications Ordered in ED Medications  0.9 %  sodium chloride infusion (has no administration in time range)  sodium chloride 0.9 % bolus 500 mL (0 mLs Intravenous Stopped 12/08/20  6378)    ED Course  I have reviewed the triage vital signs and the nursing notes.  Pertinent labs & imaging results that were available during my care of the patient were reviewed by me and considered in my medical decision making (see chart for details).   Sodium correction initiated    Mckenzie [name] was evaluated in Emergency Department on 12/08/2020 for the symptoms described in the history of present illness. She was evaluated in the context of the global COVID-19 pandemic, which necessitated consideration that the patient might be at risk for infection with the SARS-CoV-2 virus that causes COVID-19. Institutional protocols and algorithms that pertain to the evaluation of patients at risk for COVID-19 are in a state of rapid change based on information released by regulatory bodies including the CDC and federal and state organizations. These policies and algorithms were followed during the patient's care in the ED.  Final Clinical Impression(s) / ED Diagnoses Final diagnoses:  Hyponatremia  Symptomatic anemia   Admit to medicine   Admit to medicine    Leam Madero, MD 12/08/20 [id number]

## 2020-12-09 ENCOUNTER — Encounter (HOSPITAL_COMMUNITY)
Admission: EM | Disposition: A | Discharge status: Home Health | Payer: Self-pay | Source: Home / Self Care | Attending: Internal Medicine

## 2020-12-09 ENCOUNTER — Inpatient Hospital Stay (HOSPITAL_COMMUNITY): Payer: Medicare Other | Admitting: Anesthesiology

## 2020-12-09 ENCOUNTER — Encounter (HOSPITAL_COMMUNITY): Payer: Self-pay | Admitting: Internal Medicine

## 2020-12-09 DIAGNOSIS — K635 Polyp of colon: Secondary | ICD-10-CM

## 2020-12-09 DIAGNOSIS — E871 Hypo-osmolality and hyponatremia: Secondary | ICD-10-CM | POA: Diagnosis not present

## 2020-12-09 DIAGNOSIS — L8990 Pressure ulcer of unspecified site, unspecified stage: Secondary | ICD-10-CM | POA: Insufficient documentation

## 2020-12-09 DIAGNOSIS — K317 Polyp of stomach and duodenum: Secondary | ICD-10-CM | POA: Diagnosis not present

## 2020-12-09 DIAGNOSIS — D120 Benign neoplasm of cecum: Secondary | ICD-10-CM | POA: Diagnosis present

## 2020-12-09 DIAGNOSIS — D75839 Thrombocytosis, unspecified: Secondary | ICD-10-CM

## 2020-12-09 DIAGNOSIS — K449 Diaphragmatic hernia without obstruction or gangrene: Secondary | ICD-10-CM

## 2020-12-09 DIAGNOSIS — D124 Benign neoplasm of descending colon: Secondary | ICD-10-CM | POA: Diagnosis present

## 2020-12-09 DIAGNOSIS — D649 Anemia, unspecified: Secondary | ICD-10-CM

## 2020-12-09 HISTORY — PX: POLYPECTOMY: SHX5525

## 2020-12-09 HISTORY — PX: COLONOSCOPY WITH PROPOFOL: SHX5780

## 2020-12-09 HISTORY — PX: ESOPHAGOGASTRODUODENOSCOPY (EGD) WITH PROPOFOL: SHX5813

## 2020-12-09 LAB — CBC
HCT: 24.6 % — ABNORMAL LOW (ref 36.0–46.0)
Hemoglobin: 7.4 g/dL — ABNORMAL LOW (ref 12.0–15.0)
MCH: 23.3 pg — ABNORMAL LOW (ref 26.0–34.0)
MCHC: 30.1 g/dL (ref 30.0–36.0)
MCV: 77.4 fL — ABNORMAL LOW (ref 80.0–100.0)
Platelets: 550 10*3/uL — ABNORMAL HIGH (ref 150–400)
RBC: 3.18 MIL/uL — ABNORMAL LOW (ref 3.87–5.11)
RDW: 15 % (ref 11.5–15.5)
WBC: 9 10*3/uL (ref 4.0–10.5)
nRBC: 0 % (ref 0.0–0.2)

## 2020-12-09 LAB — BASIC METABOLIC PANEL
Anion gap: 8 (ref 5–15)
BUN: <5 mg/dL — ABNORMAL LOW (ref 8–23)
CO2: 23 mmol/L (ref 22–32)
Calcium: 8.4 mg/dL — ABNORMAL LOW (ref 8.9–10.3)
Chloride: 98 mmol/L (ref 98–111)
Creatinine, Ser: 0.32 mg/dL — ABNORMAL LOW (ref 0.44–1.00)
GFR, Estimated: >60 mL/min (ref >60)
Glucose, Bld: 100 mg/dL — ABNORMAL HIGH (ref 70–99)
Potassium: 3.8 mmol/L (ref 3.5–5.1)
Sodium: 129 mmol/L — ABNORMAL LOW (ref 135–145)

## 2020-12-09 LAB — HEMOGLOBIN AND HEMATOCRIT, BLOOD
HCT: 26.7 % — ABNORMAL LOW (ref 36.0–46.0)
HCT: 26.8 % — ABNORMAL LOW (ref 36.0–46.0)
Hemoglobin: 8.4 g/dL — ABNORMAL LOW (ref 12.0–15.0)
Hemoglobin: 8 g/dL — ABNORMAL LOW (ref 12.0–15.0)

## 2020-12-09 LAB — COMPREHENSIVE METABOLIC PANEL
ALT: 46 U/L — ABNORMAL HIGH (ref 0–44)
AST: 28 U/L (ref 15–41)
Albumin: 1.6 g/dL — ABNORMAL LOW (ref 3.5–5.0)
Alkaline Phosphatase: 121 U/L (ref 38–126)
Anion gap: 8 (ref 5–15)
BUN: <5 mg/dL — ABNORMAL LOW (ref 8–23)
CO2: 23 mmol/L (ref 22–32)
Calcium: 8.2 mg/dL — ABNORMAL LOW (ref 8.9–10.3)
Chloride: 96 mmol/L — ABNORMAL LOW (ref 98–111)
Creatinine, Ser: <0.3 mg/dL — ABNORMAL LOW (ref 0.44–1.00)
Glucose, Bld: 122 mg/dL — ABNORMAL HIGH (ref 70–99)
Potassium: 3.1 mmol/L — ABNORMAL LOW (ref 3.5–5.1)
Sodium: 127 mmol/L — ABNORMAL LOW (ref 135–145)
Total Bilirubin: 0.5 mg/dL (ref 0.3–1.2)
Total Protein: 5.9 g/dL — ABNORMAL LOW (ref 6.5–8.1)

## 2020-12-09 LAB — SEDIMENTATION RATE: Sed Rate: 112 mm/hr — ABNORMAL HIGH (ref 0–22)

## 2020-12-09 LAB — C-REACTIVE PROTEIN: CRP: 12.3 mg/dL — ABNORMAL HIGH (ref <1.0)

## 2020-12-09 LAB — ABO/RH: ABO/RH(D): A POS

## 2020-12-09 LAB — PREPARE RBC (CROSSMATCH)

## 2020-12-09 LAB — URIC ACID: Uric Acid, Serum: 2.3 mg/dL — ABNORMAL LOW (ref 2.5–7.1)

## 2020-12-09 SURGERY — COLONOSCOPY WITH PROPOFOL
Anesthesia: Monitor Anesthesia Care

## 2020-12-09 MED ORDER — HYDRALAZINE HCL 20 MG/ML IJ SOLN
5.0000 mg | Freq: Four times a day (QID) | INTRAMUSCULAR | Status: DC | PRN
  Administered 2020-12-17: 5 mg via INTRAVENOUS
  Filled 2020-12-09: qty 1

## 2020-12-09 MED ORDER — SODIUM CHLORIDE 0.9 % IV SOLN: INTRAVENOUS | Status: DC

## 2020-12-09 MED ORDER — MAGNESIUM OXIDE 400 (241.3 MG) MG PO TABS: 400.0000 mg | ORAL_TABLET | Freq: Every day | ORAL | Status: DC

## 2020-12-09 MED ORDER — POTASSIUM CHLORIDE 10 MEQ/100ML IV SOLN
10.0000 meq | INTRAVENOUS | Status: AC
  Administered 2020-12-09 (×2): 10 meq via INTRAVENOUS
  Filled 2020-12-09 (×2): qty 100

## 2020-12-09 MED ORDER — SODIUM CHLORIDE 0.9% IV SOLUTION: Freq: Once | INTRAVENOUS | Status: AC

## 2020-12-09 MED ORDER — LACTATED RINGERS IV SOLN: INTRAVENOUS | Status: DC

## 2020-12-09 MED ORDER — PROPOFOL 500 MG/50ML IV EMUL
INTRAVENOUS | Status: DC | PRN
  Administered 2020-12-09: 75 ug/kg/min via INTRAVENOUS

## 2020-12-09 MED ORDER — AMLODIPINE BESYLATE 5 MG PO TABS
2.5000 mg | ORAL_TABLET | Freq: Every day | ORAL | Status: DC
  Administered 2020-12-10 – 2020-12-19 (×10): 2.5 mg via ORAL
  Filled 2020-12-09 (×11): qty 1

## 2020-12-09 MED ORDER — PROPOFOL 10 MG/ML IV BOLUS
INTRAVENOUS | Status: DC | PRN
  Administered 2020-12-09 (×2): 20 mg via INTRAVENOUS

## 2020-12-09 MED ORDER — AMLODIPINE BESYLATE 5 MG PO TABS: 5.0000 mg | ORAL_TABLET | Freq: Every day | ORAL | Status: DC

## 2020-12-09 MED ORDER — POTASSIUM CHLORIDE CRYS ER 20 MEQ PO TBCR
20.0000 meq | EXTENDED_RELEASE_TABLET | ORAL | Status: AC
  Administered 2020-12-09: 20 meq via ORAL
  Filled 2020-12-09: qty 1

## 2020-12-09 MED ORDER — LACTATED RINGERS IV SOLN: INTRAVENOUS | Status: DC | PRN

## 2020-12-09 MED ORDER — FERROUS SULFATE 325 (65 FE) MG PO TABS
325.0000 mg | ORAL_TABLET | Freq: Every day | ORAL | Status: DC
  Administered 2020-12-10 – 2020-12-19 (×10): 325 mg via ORAL
  Filled 2020-12-09 (×11): qty 1

## 2020-12-09 SURGICAL SUPPLY — 25 items

## 2020-12-09 NOTE — Progress Notes (Signed)
Updated patient's daughter on current care plan via phone.  Initially called the patient's husband to obtain consent for blood transfusion.  He requested that I call his daughter [name] [phone] and that if she gave consent that in turn he would consent also to the blood transfusion.  Per oncology, goal Hg 9.0 prior to dc.  Consent obtained. We will transfuse 1 U PRBC.  Repeat H&H post transfusion.

## 2020-12-09 NOTE — Plan of Care (Signed)

## 2020-12-09 NOTE — Op Note (Addendum)
[facility] Patient Name: Mckenzie [name] Procedure Date : 12/09/2020 MRN: [id number] Attending MD: Gatha Mayer , MD Date of Birth: 23-Jan-1946 CSN: [id number] Age: 75 Admit Type: Inpatient Procedure:                Colonoscopy Indications:              Anemia Providers:                Gatha Mayer, MD, Baird Cancer, RN, Benetta Spar, Technician, Laverda Sorenson, Technician Referring MD:             Carol Ada, MD Medicines:                Propofol per Anesthesia, Monitored Anesthesia Care Complications:            No immediate complications. Estimated Blood Loss:     Estimated blood loss was minimal. Procedure:                Pre-Anesthesia Assessment:                           - Prior to the procedure, a History and Physical                            was performed, and patient medications and                            allergies were reviewed. The patient's tolerance of                            previous anesthesia was also reviewed. The risks                            and benefits of the procedure and the sedation                            options and risks were discussed with the patient.                            All questions were answered, and informed consent                            was obtained. Prior Anticoagulants: The patient has                            taken no previous anticoagulant or antiplatelet                            agents. ASA Grade Assessment: III - A patient with                            severe systemic disease. After reviewing the risks  and benefits, the patient was deemed in                            satisfactory condition to undergo the procedure.                           After obtaining informed consent, the colonoscope                            was passed under direct vision. Throughout the                            procedure, the patient's blood pressure, pulse,  and                            oxygen saturations were monitored continuously. The                            [id number]-HQ190L ([id number] Olympus colonoscope was                            introduced through the anus and advanced to the the                            cecum, identified by appendiceal orifice and                            ileocecal valve. The colonoscopy was somewhat                            difficult due to significant looping. Successful                            completion of the procedure was aided by applying                            abdominal pressure. The patient tolerated the                            procedure well. The quality of the bowel                            preparation was good. The ileocecal valve,                            appendiceal orifice, and rectum were photographed. Scope In: 10:15:49 AM Scope Out: 10:34:02 AM Scope Withdrawal Time: 0 hours 11 minutes 18 seconds  Total Procedure Duration: 0 hours 18 minutes 13 seconds  Findings:      The perianal and digital rectal examinations were normal.      Three sessile polyps were found in the descending colon and cecum. The       polyps were diminutive in size. These polyps were removed with a cold       snare. Resection and retrieval were complete. Verification of  patient       identification for the specimen was done. Estimated blood loss was       minimal.      Multiple diverticula were found in the sigmoid colon.      The exam was otherwise without abnormality on direct and retroflexion       views. Impression:               - Three diminutive polyps in the descending colon                            and in the cecum, removed with a cold snare.                            Resected and retrieved.                           - Diverticulosis in the sigmoid colon.                           - The examination was otherwise normal on direct                            and retroflexion views. There was some  fibrous                            debrix overlying appendiceal orifice that could not                            remove - slight limitation of exam. Recommendation:           - Return patient to hospital ward for ongoing care.                           - Soft diet.                           - No repeat colonoscopy due to age.                           - Await pathology results.                           - Her anemia is most consistent with anemia of                            chronic disease and NOT ireon deficiency (high                            ferritin, low TIBC, low iron sat)                           consioder outpatient heme eval                           no further GI w/u  signing off                           I will notify re: pathology                           does not need q 6 cbc I dced                           I updated husband Procedure Code(s):        --- Professional ---                           [phone], Colonoscopy, flexible; with removal of                            tumor(s), polyp(s), or other lesion(s) by snare                            technique Diagnosis Code(s):        --- Professional ---                           K63.5, Polyp of colon                           D64.9, Anemia, unspecified                           K57.30, Diverticulosis of large intestine without                            perforation or abscess without bleeding CPT copyright 2019 [employer]. All rights reserved. The codes documented in this report are preliminary and upon coder review may  be revised to meet current compliance requirements. Gatha Mayer, MD 12/09/2020 11:01:26 AM This report has been signed electronically. Number of Addenda: 0

## 2020-12-09 NOTE — Consult Note (Signed)
Reason for the request:    Anemia  HPI: I was asked by Dr. [name] to evaluate Mckenzie [name] for the diagnosis of anemia.  She is a 75 year old woman with history of hypertension hyperlipidemia was hospitalized in December 2021 after she sustained a fall.  She apparently has been doing poorly before that admission and noticing periodic confusion and her work-up leading up to that admission was unclear or any specific diagnosis.  Per her admission evaluation, she had a normal ESR, CRP, ANA, uric acid and SPEP at that time.  She underwent surgical fixation of her distal radius fracture on December 30 and was discharged subsequently.  According to the family she has been doing poorly since that time with periodic episodes of hypotension, confusion.  Upon her discharge on December 31 her hemoglobin was 9.5 with a platelet count of 482 after platelet count was as high as 613.  CBC obtained on February 15 showed white cell count 11.5 hemoglobin of 9.0 blood count of 617.  Repeat CBC on February 25 showed hemoglobin of 9.6 and a platelet count of 738.  Her hemoglobin was as low as 8.5 with MCV 79 and a platelet count of 686 on March 3.  Iron studies showed persistent low iron at 12 and 18 respectively.  Saturation at 90% with elevated ferritin at 496.  Iron binding capacity was low at 198.  She was hospitalized overnight today to undergo a GI work-up and colonoscopy and endoscopy completed today showed no clear evidence of bleeding or malignancy.  CT scan on November 30, 2020 showed no acute pathology.  She had CT scan of the cervical spine head and maxillofacial scans on December 30 which showed no other abnormalities.  Medically, she is unable to provide much history but per her husband and daughter she continues to be quite debilitated with vague complaints.  She denies any hematochezia or melena or hemoptysis at this time.  She does not report any headaches, blurry vision, syncope or seizures. Does not report any  fevers, chills or sweats.  Does not report any cough, wheezing or hemoptysis.  Does not report any chest pain, palpitation, orthopnea or leg edema.  Does not report any nausea, vomiting or abdominal pain.  Does not report any constipation or diarrhea.  Does not report any skeletal complaints.    Does not report frequency, urgency or hematuria.  Does not report any skin rashes or lesions. Does not report any heat or cold intolerance.  Does not report any lymphadenopathy or petechiae.  Remaining review of systems is negative.    Past Medical History:  Diagnosis Date  . Depression   . High cholesterol   . Hypertension   . Retinal micro-aneurysm of right eye   :  Past Surgical History:  Procedure Laterality Date  . EYE SURGERY    . FINGER SURGERY Left    RING FINGER  . OPEN REDUCTION INTERNAL FIXATION (ORIF) DISTAL RADIAL FRACTURE Left 10/05/2020   Procedure: OPEN REDUCTION INTERNAL FIXATION (ORIF) DISTAL RADIAL FRACTURE;  Surgeon: Shona Needles, MD;  Location[facility];  Service: Orthopedics;  Laterality: Left;  supraclavicular block  . TUBAL LIGATION    :   Current Facility-Administered Medications:  .  acetaminophen (TYLENOL) tablet 650 mg, 650 mg, Oral, Q6H PRN **OR** acetaminophen (TYLENOL) suppository 650 mg, 650 mg, Rectal, Q6H PRN, Smith, Rondell A, MD .  amLODipine (NORVASC) tablet 5 mg, 5 mg, Oral, Daily, Hall, Carole N, DO .  timolol (TIMOPTIC) 0.5 % ophthalmic  solution 1 drop, 1 drop, Both Eyes, BID, 1 drop at 12/09/20 1426 **AND** brimonidine (ALPHAGAN) 0.2 % ophthalmic solution 1 drop, 1 drop, Both Eyes, BID, Smith, Rondell A, MD, 1 drop at 12/09/20 1426 .  dorzolamide (TRUSOPT) 2 % ophthalmic solution 1 drop, 1 drop, Both Eyes, BID, Smith, Rondell A, MD, 1 drop at 12/09/20 1422 .  escitalopram (LEXAPRO) tablet 20 mg, 20 mg, Oral, Daily, Smith, Rondell A, MD, 20 mg at 12/08/20 1500 .  [START ON 12/10/2020] ferrous sulfate tablet 325 mg, 325 mg, Oral, Q breakfast, Hall, Carole N,  DO .  fluorometholone (FML) 0.1 % ophthalmic suspension 1 drop, 1 drop, Right Eye, BID, Smith, Rondell A, MD, 1 drop at 12/09/20 1423 .  hydrALAZINE (APRESOLINE) injection 5 mg, 5 mg, Intravenous, Q6H PRN, Hall, Carole N, DO .  ketorolac (ACULAR) 0.5 % ophthalmic solution 1 drop, 1 drop, Right Eye, Daily, Smith, Rondell A, MD, 1 drop at 12/08/20 1313 .  lactated ringers infusion, , Intravenous, Continuous, Gatha Mayer, MD, Last Rate: 20 mL/hr at 12/09/20 0917, New Bag at 12/09/20 0917 .  latanoprost (XALATAN) 0.005 % ophthalmic solution 1 drop, 1 drop, Both Eyes, QHS, Smith, Rondell A, MD, 1 drop at 12/08/20 2212 .  linaclotide (LINZESS) capsule 145 mcg, 145 mcg, Oral, QAC breakfast, Fuller Plan A, MD, 145 mcg at 12/08/20 1313 .  Netarsudil Dimesylate 0.02 % SOLN 1 drop, 1 drop, Both Eyes, BID, Smith, Rondell A, MD .  ondansetron (ZOFRAN) tablet 4 mg, 4 mg, Oral, Q6H PRN **OR** ondansetron (ZOFRAN) injection 4 mg, 4 mg, Intravenous, Q6H PRN, Smith, Rondell A, MD .  polyvinyl alcohol (LIQUIFILM TEARS) 1.4 % ophthalmic solution 1 drop, 1 drop, Both Eyes, TID PRN, Smith, Rondell A, MD .  sodium chloride flush (NS) 0.9 % injection 3 mL, 3 mL, Intravenous, Q12H, Smith, Rondell A, MD, 3 mL at 12/08/20 2212:  Allergies  Allergen Reactions  . Aldomet [Methyldopa] Other (See Comments)    flu  . Fosamax [Alendronate Sodium]   . Acetazolamide Rash  . Buspar [Buspirone] Palpitations  :  Family History  Problem Relation Age of Onset  . Hyperlipidemia Mother   . Hypertension Mother   . Diabetes Mother   . COPD Father   . Depression Sister   . Drug abuse Sister   . Suicidality Brother   . Colon cancer Neg Hx   . Celiac disease Neg Hx   . Inflammatory bowel disease Neg Hx   :  Social History   Socioeconomic History  . Marital status: Married    Spouse name: Not on file  . Number of children: 3  . Years of education: 54  . Highest education level: Not on file  Occupational History   . Not on file  Tobacco Use  . Smoking status: Former Smoker    Types: Cigarettes  . Smokeless tobacco: Never Used  . Tobacco comment: for a short period  Vaping Use  . Vaping Use: Never used  Substance and Sexual Activity  . Alcohol use: No  . Drug use: No  . Sexual activity: Not on file  Other Topics Concern  . Not on file  Social History Narrative   Right handed   Caffeine use: coffee daily   Social Determinants of Health   Financial Resource Strain: Not on file  Food Insecurity: Not on file  Transportation Needs: Not on file  Physical Activity: Not on file  Stress: Not on file  Social Connections: Not on file  Intimate Partner Violence: Not on file  :  Pertinent items are noted in HPI.  Exam: Blood pressure (!) 156/56, pulse 77, temperature 97.8 F (36.6 C), temperature source Oral, resp. rate 14, SpO2 98 %.    General appearance: Sleeping but easily arousable and responsive. Head: atraumatic without any abnormalities. Eyes: conjunctivae/corneas clear. PERRL.  Sclera anicteric. Throat: lips, mucosa, and tongue normal; without oral thrush or ulcers. Resp: clear to auscultation bilaterally without rhonchi, wheezes or dullness to percussion. Cardio: regular rate and rhythm, S1, S2 normal, no murmur, click, rub or gallop GI: soft, non-tender; bowel sounds normal; no masses,  no organomegaly Skin: Skin color, texture, turgor normal. No rashes or lesions Lymph nodes: Cervical, supraclavicular, and axillary nodes normal. Neurologic: Grossly normal without any motor, sensory or deep tendon reflexes. Musculoskeletal: No joint deformity or effusion.  Recent Labs    12/07/20 1633 12/09/20 0203 12/09/20 0754  WBC 11.3* 9.0  --   HGB 8.5* 7.4* 8.4*  HCT 29.0* 24.6* 26.7*  PLT 686* 550*  --    Recent Labs    12/08/20 1651 12/09/20 0203  NA 129* 127*  K 3.6 3.1*  CL 98 96*  CO2 24 23  GLUCOSE 128* 122*  BUN 9 <5*  CREATININE 0.51 <0.30*  CALCIUM 8.2* 8.2*      CT Head Wo Contrast  Result Date: 12/08/2020 CLINICAL DATA:  Facial trauma; hyponatremia, hypokalemia, anemia EXAM: CT HEAD WITHOUT CONTRAST TECHNIQUE: Contiguous axial images were obtained from the base of the skull through the vertex without intravenous contrast. Sagittal and coronal MPR images reconstructed from axial data set. COMPARISON:  01/10/2016 FINDINGS: Brain: Generalized atrophy. Normal ventricular morphology. No midline shift or mass effect. Small vessel chronic ischemic changes of deep cerebral white matter. Old BILATERAL cerebellar infarcts. No intracranial hemorrhage, mass lesion, or evidence of acute infarction. No extra-axial fluid collections. Vascular: No hyperdense vessels Skull: Demineralized but intact calvaria. Visualized facial bones appear intact. Sinuses/Orbits: Paranasal sinuses and mastoid air cells clear. Intraorbital soft tissue planes clear. Other: N/A IMPRESSION: Atrophy with small vessel chronic ischemic changes of deep cerebral white matter. Old BILATERAL cerebellar infarcts. No acute intracranial abnormalities. Electronically Signed   By: Lavonia Dana M.D.   On: 12/08/2020 08:12   CT Abdomen Pelvis W Contrast  Result Date: 11/30/2020 CLINICAL DATA:  Generalized abdominal pain. Constipation. Elevated liver function tests. EXAM: CT ABDOMEN AND PELVIS WITH CONTRAST TECHNIQUE: Multidetector CT imaging of the abdomen and pelvis was performed using the standard protocol following bolus administration of intravenous contrast. CONTRAST:  9m OMNIPAQUE IOHEXOL 300 MG/ML  SOLN COMPARISON:  None. FINDINGS: Lower Chest: Small pericardial effusion and small left pleural effusion are noted. Hepatobiliary: No hepatic masses identified. A few tiny calcified gallstones are seen, however there is no evidence of cholecystitis or biliary ductal dilatation. Pancreas:  No mass or inflammatory changes. Spleen: Within normal limits in size and appearance. Adrenals/Urinary Tract: No masses  identified. Small left renal cyst noted. No evidence of ureteral calculi or hydronephrosis. Stomach/Bowel: No evidence of obstruction, inflammatory process or abnormal fluid collections. Normal appendix visualized. Mild Diverticulosis is seen involving the sigmoid colon, however there is no evidence of diverticulitis. Vascular/Lymphatic: No pathologically enlarged lymph nodes. No abdominal aortic aneurysm. Aortic atherosclerotic calcification noted. Reproductive:  No mass or other significant abnormality. Other:  None. Musculoskeletal:  No suspicious bone lesions identified. IMPRESSION: No acute findings within the abdomen or pelvis. Cholelithiasis. No radiographic evidence of cholecystitis. Mild sigmoid diverticulosis. No radiographic evidence of diverticulitis. Small  pericardial effusion and small left pleural effusion. Electronically Signed   By: Marlaine Hind M.D.   On: 11/30/2020 18:55   ECHOCARDIOGRAM COMPLETE  Result Date: 12/08/2020    ECHOCARDIOGRAM REPORT   Patient Name:   LAJADA JANES Date of Exam: 12/08/2020 Medical Rec #:  [id number]         Height:       65.0 in Accession #:    [id number]        Weight:       105.0 lb Date of Birth:  1946-04-20          BSA:          1.504 m Patient Age:    24 years          BP:           147/64 mmHg Patient Gender: F                 HR:           85 bpm. Exam Location:  Inpatient Procedure: 2D Echo, Cardiac Doppler and Color Doppler Indications:    Cardiomegaly I51.7  History:        Patient has no prior history of Echocardiogram examinations.                 Risk Factors:Hypertension.  Sonographer:    Bernadene Person RDCS Referring Phys: [id number] RONDELL A SMITH IMPRESSIONS  1. Left ventricular ejection fraction, by estimation, is 65 to 70%. The left ventricle has normal function. The left ventricle has no regional wall motion abnormalities. Left ventricular diastolic parameters are consistent with Grade I diastolic dysfunction (impaired relaxation).  2. Right  ventricular systolic function is normal. The right ventricular size is normal. There is normal pulmonary artery systolic pressure. The estimated right ventricular systolic pressure is 62.7 mmHg.  3. Left atrial size was moderately dilated.  4. Diastolic free wall collapse.  5. A small pericardial effusion is present. The pericardial effusion is circumferential. There is no evidence of cardiac tamponade.  6. The mitral valve is grossly normal. Trivial mitral valve regurgitation.  7. The aortic valve is tricuspid. Aortic valve regurgitation is not visualized.  8. The inferior vena cava is normal in size with greater than 50% respiratory variability, suggesting right atrial pressure of 3 mmHg. Comparison(s): No prior Echocardiogram. FINDINGS  Left Ventricle: Left ventricular ejection fraction, by estimation, is 65 to 70%. The left ventricle has normal function. The left ventricle has no regional wall motion abnormalities. The left ventricular internal cavity size was normal in size. There is  no left ventricular hypertrophy. Left ventricular diastolic parameters are consistent with Grade I diastolic dysfunction (impaired relaxation). Indeterminate filling pressures. Right Ventricle: The right ventricular size is normal. No increase in right ventricular wall thickness. Right ventricular systolic function is normal. There is normal pulmonary artery systolic pressure. The tricuspid regurgitant velocity is 2.29 m/s, and  with an assumed right atrial pressure of 3 mmHg, the estimated right ventricular systolic pressure is 03.5 mmHg. Left Atrium: Left atrial size was moderately dilated. Right Atrium: Diastolic free wall collapse. Right atrial size was normal in size. Pericardium: A small pericardial effusion is present. The pericardial effusion is circumferential. There is diastolic collapse of the right atrial wall and excessive respiratory variation in the tricuspid valve spectral Doppler velocities. There is no evidence  of cardiac tamponade. Mitral Valve: The mitral valve is grossly normal. Trivial mitral valve regurgitation. Tricuspid Valve: The tricuspid valve is grossly  normal. Tricuspid valve regurgitation is mild. Aortic Valve: The aortic valve is tricuspid. Aortic valve regurgitation is not visualized. Pulmonic Valve: The pulmonic valve was normal in structure. Pulmonic valve regurgitation is not visualized. Aorta: The aortic root and ascending aorta are structurally normal, with no evidence of dilitation. Venous: The inferior vena cava is normal in size with greater than 50% respiratory variability, suggesting right atrial pressure of 3 mmHg. IAS/Shunts: No atrial level shunt detected by color flow Doppler.  LEFT VENTRICLE PLAX 2D LVIDd:         4.70 cm  Diastology LVIDs:         2.90 cm  LV e' medial:    7.38 cm/s LV PW:         0.90 cm  LV E/e' medial:  11.6 LV IVS:        0.80 cm  LV e' lateral:   9.20 cm/s LVOT diam:     1.90 cm  LV E/e' lateral: 9.3 LV SV:         77 LV SV Index:   51 LVOT Area:     2.84 cm  RIGHT VENTRICLE RV S prime:     14.90 cm/s TAPSE (M-mode): 2.0 cm LEFT ATRIUM             Index       RIGHT ATRIUM           Index LA diam:        3.10 cm 2.06 cm/m  RA Area:     11.80 cm LA Vol (A2C):   63.5 ml 42.21 ml/m RA Volume:   23.40 ml  15.56 ml/m LA Vol (A4C):   57.5 ml 38.22 ml/m LA Biplane Vol: 63.3 ml 42.08 ml/m  AORTIC VALVE LVOT Vmax:   115.00 cm/s LVOT Vmean:  83.100 cm/s LVOT VTI:    0.271 m  AORTA Ao Root diam: 3.00 cm Ao Asc diam:  3.00 cm MITRAL VALVE               TRICUSPID VALVE MV Area (PHT): 3.99 cm    TR Peak grad:   21.0 mmHg MV Decel Time: 190 msec    TR Vmax:        229.00 cm/s MV E velocity: 85.90 cm/s MV A velocity: 95.10 cm/s  SHUNTS MV E/A ratio:  0.90        Systemic VTI:  0.27 m                            Systemic Diam: 1.90 cm Lyman Bishop MD Electronically signed by Lyman Bishop MD Signature Date/Time: 12/08/2020/12:22:01 PM    Final     Assessment and Plan:     75 year old woman with:  1.  Anemia associated with microcytosis and iron panel more consistent with anemia of chronic disease than iron deficiency.  Etiology is unclear at this time with her work-up does not reveal any GI malignancy including colonoscopy, endoscopy and CT scan.  He does not have known cause for a chronic inflammatory condition.  From a management standpoint, I recommend optimizing her hemoglobin for better functionality including transfusion of packed red cells to keep her hemoglobin above 9 prior to discharge.  I also recommend evaluating for any other chronic inflammatory conditions including autoimmune disorders and discontinuing any unnecessary medication.   2.  Thrombocytosis: Reactive in nature likely consistent with the pattern of iron deficiency on possible chronic inflammatory condition.  I doubt this is a myeloproliferative disorder given the actual decline in her platelets in the last month.   3.  Hyponatremia: Could be contributing to her overall lethargy and poor mental status.  Remains unclear etiology at this time.  Management as per the primary team.  All questions from the patient, husband and daughter were answered today.  80  minutes were dedicated to this visit.  More than 50% of the time was face-to-face.  The time was spent on reviewing laboratory data, imaging studies, discussing differential diagnosis and answering questions from the patient husband and daughter via phone..     A copy of this consult has been forwarded to the requesting physician.

## 2020-12-09 NOTE — Progress Notes (Signed)
PT Cancellation Note  Patient Details Name: Mckenzie [name] MRN: [id number] DOB: 1946/06/11   Cancelled Treatment:    Reason Eval/Treat Not Completed: Patient at procedure or test/unavailable at this time. Pt with colonoscopy and esophagogastroduodenoscopy this morning. PT will continue to follow and evaluate as time/schedule allows.  Hardie Pulley, DPT   Acute Rehabilitation Department Pager #: [phone]   Otho Bellows 12/09/2020, 10:53 AM

## 2020-12-09 NOTE — Progress Notes (Signed)
OT Cancellation Note  Patient Details Name: EVOLEHT HOVATTER MRN: [id number] DOB: [birth year]   Cancelled Treatment:    Reason Eval/Treat Not Completed: Patient at procedure or test/ unavailable. . Pt with colonoscopy and esophagogastroduodenoscopy this morning. OT will continue to follow and evaluate as time/schedule allows.  Layla Maw 12/09/2020, 11:09 AM

## 2020-12-09 NOTE — Progress Notes (Signed)
MD ordered norvasc for patient, however, patient's husband stated he did not want patient to take this medication because previously at home when patient took blood pressure medications, blood pressure dropped really low causing patient to have to be sent to emergency room. MD notified and med not given as requested.

## 2020-12-09 NOTE — Op Note (Addendum)
[facility] Patient Name: Mckenzie [name] Procedure Date : 12/09/2020 MRN: [id number] Attending MD: Gatha Mayer , MD Date of Birth: 08-23-1946 CSN: [id number] Age: 75 Admit Type: Inpatient Procedure:                Upper GI endoscopy Indications:              Anemia - microcutic low sat and TIBC and high                            ferritin Providers:                Gatha Mayer, MD, Baird Cancer, RN, Benetta Spar, Technician, Grace Isaac, RN, Laverda Sorenson, Technician Referring MD:             Carol Ada, MD Medicines:                Propofol per Anesthesia, Monitored Anesthesia Care Complications:            No immediate complications. Estimated Blood Loss:     Estimated blood loss: none. Procedure:                Pre-Anesthesia Assessment:                           - Prior to the procedure, a History and Physical                            was performed, and patient medications and                            allergies were reviewed. The patient's tolerance of                            previous anesthesia was also reviewed. The risks                            and benefits of the procedure and the sedation                            options and risks were discussed with the patient.                            All questions were answered, and informed consent                            was obtained. Prior Anticoagulants: The patient has                            taken no previous anticoagulant or antiplatelet  agents. ASA Grade Assessment: III - A patient with                            severe systemic disease. After reviewing the risks                            and benefits, the patient was deemed in                            satisfactory condition to undergo the procedure.                           After obtaining informed consent, the endoscope was                             passed under direct vision. Throughout the                            procedure, the patient's blood pressure, pulse, and                            oxygen saturations were monitored continuously. The                            GIF-H190 ([id number] Olympus gastroscope was                            introduced through the mouth, and advanced to the                            second part of duodenum. The upper GI endoscopy was                            accomplished without difficulty. The patient                            tolerated the procedure well. Scope In: Scope Out: Findings:      A large hiatal hernia was present.      Multiple diminutive sessile polyps were found in the gastric fundus and       in the gastric body.      The exam was otherwise without abnormality.      The cardia and gastric fundus were normal on retroflexion. Impression:               - Large hiatal hernia. No Cameron ulcers                           - Multiple gastric polyps. Diminutive and c/w                            benign fundic gland polyps                           - The examination was otherwise normal. No cause of  anemia seen                           - No specimens collected. Recommendation:           - See the other procedure note for documentation of                            additional recommendations. COLONOSCOPY NEXT Procedure Code(s):        --- Professional ---                           [phone], Esophagogastroduodenoscopy, flexible,                            transoral; diagnostic, including collection of                            specimen(s) by brushing or washing, when performed                            (separate procedure) Diagnosis Code(s):        --- Professional ---                           K44.9, Diaphragmatic hernia without obstruction or                            gangrene                           K31.7, Polyp of stomach and duodenum                            D64.9, Anemia, unspecified CPT copyright 2019 [employer]. All rights reserved. The codes documented in this report are preliminary and upon coder review may  be revised to meet current compliance requirements. Gatha Mayer, MD 12/09/2020 10:55:55 AM This report has been signed electronically. Number of Addenda: 0

## 2020-12-09 NOTE — Anesthesia Postprocedure Evaluation (Signed)
Anesthesia Post Note  Patient: Mckenzie [name]  Procedure(s) Performed: COLONOSCOPY WITH PROPOFOL (N/A ) ESOPHAGOGASTRODUODENOSCOPY (EGD) WITH PROPOFOL (N/A )     Patient location during evaluation: PACU Anesthesia Type: MAC Level of consciousness: awake Pain management: pain level controlled Vital Signs Assessment: post-procedure vital signs reviewed and stable Respiratory status: spontaneous breathing, nonlabored ventilation, respiratory function stable and patient connected to nasal cannula oxygen Cardiovascular status: stable and blood pressure returned to baseline Postop Assessment: no apparent nausea or vomiting Anesthetic complications: no   No complications documented.  Last Vitals:  Vitals:   12/09/20 1109 12/09/20 1128  BP: (!) 184/59 (!) 156/56  Pulse: 72 77  Resp: 14 14  Temp:  36.6 C  SpO2: 97% 98%    Last Pain:  Vitals:   12/09/20 1128  TempSrc: Oral  PainSc: 0-No pain                 [place] Nie P Valencia Kassa

## 2020-12-09 NOTE — Progress Notes (Addendum)
PROGRESS NOTE  Mckenzie [name] DDU:[id number] DOB: 06-Jun-1946 DOA: 12/07/2020 PCP: Loman Brooklyn, FNP  HPI/Recap of past 24 hours: Mckenzie [name] is a 75 y.o. female with medical history significant of hypertension, hyperlipidemia, anxiety, and depression presents after being advised by her primary care provider to come in for low blood counts and low sodium levels.  Patient's daughter is present at bedside and provides much of the patient's history.  Apparently over the last 3 months patient has been dealing with weakness and confusion.  Last hospitalized on 10/05/2020 after having a fall status post open reduction internal fixation of a left distal radial fracture.  Since that hospitalization patient daughter notes that she has had a progressive decline and now needs assistance to complete pretty much all of her ADLs.  It seems she was started on Lexapro 2 months ago for adjustment disorder with anxiety and depression.  Daughter notes that the medication has been helping and would like to continue this medication possible..  She has been being followed by Dr. Allyson Sabal of neurology for worsening confusion and weakness.  Patient had been worked up for for her symptoms and had been told that she did not have dementia.  Also reports patient having EMG studies that were negative.  During this time patient sodium levels have been trending down and hemoglobin as well.  Patient denies any reports of blood in stool and she has been on ferrous sulfate supplements.  She had followed up with [facility] and was scheduled to have a colonoscopy on 3/7.  Directed to stop taking ferrous sulfate and take Linzess prior to the procedure.  Family is concerned that she has an underlying malignancy.  In regards to her sodium levels PCP reportedly was concerned for SIADH and seemed to think Lexapro was less likely to blame.  Her daughter states that she has been eating well, but they have to make sure that she eats.  ED  Course: Upon admission to the emergency department patient was seen to be afebrile with temperature 99.1 F, blood pressure 148/59-178/59, and all other vital signs maintained.  Labs significant for WBC 11.3, hemoglobin 8.5, platelets 686, sodium 125, chloride 92, calcium 8.6, and troponins negative x2.  Urinalysis negative for any signs of infection.  Patient had been given 500 mL fluid bolus, and then placed on IV fluids of 300 mL/h.  TRH called to admit.  12/09/20: Patient was seen and examined with her husband at her bedside.  She is minimally interactive.  She has no new complaints.  Per her husband her mood is stable on Lexapro and her depression is well controlled.  She had an EGD and colonoscopy, findings did not explain her anemia.  Her husband requested hematology consult.  Addendum:  Spoke with the patient's daughter who is concerned about her generalized weakness.  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  2D echo with normal LVEF 65-70%, no regional wall motion abnormalities, negative troponin.  Electrolytes replaced with repeat serum K+ 3.8, magnesium 1.7, repleted.  Per daughter, patient's depression is well controlled.  Inflammatory markers ordered, will follow.  1U PRBC ordred to be transfused with husband and daughter consent on 12/09/20.  Hg goal at least 9.0 per Hematology.  Assessment/Plan: Principal Problem:   Hyponatremia Active Problems:   Hypertension   Proximal weakness of extremity  Secondary glaucoma   Adjustment disorder with mixed anxiety and depressed mood   Anemia of chronic disease   Thrombocytosis   Hypoalbuminemia   Memory loss   Pressure injury of skin   Benign neoplasm of cecum   Benign neoplasm of descending colon   Hiatal hernia   Hypovolemic hyponatremia  Presented with serum sodium 125  Currently  serum sodium 127 Started normal saline at 50 cc/h x 2 days. Repeat BMP this afternoon and in the morning.  Iron deficiency anemia Hemoglobin dropped to 7.4 with an MCV of 77, FOBT negative. Iron studies suggestive of iron deficiency. Started on ferrous sulfate 325 mg daily. She had an endoscopy and colonoscopy done on 12/09/2020 which did not explain her anemia.  GI signed off. Seen by hematology, recommends to keep hemoglobin greater than 9.0.  Will follow-up outpatient.  Generalized weakness, unclear etiology Spoke with the patient's daughter who is concerned about her generalized weakness.  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  2D echo with normal LVEF 65-70%, no regional wall motion abnormalities, negative troponin.  Electrolytes replaced with repeat serum K+ 3.8, magnesium 1.7, repleted.  Per daughter, patient's depression is well controlled.  Inflammatory markers ordered, will follow. 1U PRBC ordred to be transfused with husband and daughter consent on 12/09/20.  Hg goal at least 9.0 per Hematology.  Elevated BP Blood pressure has been consistently elevated since her admission. Her husband has refused BP medication provided which was Norvasc 5 mg daily. Continue to monitor vital signs.  Memory loss possibly secondary to her depression. Family reports patient has had intermittent confusion and memory loss.   She has been worked up by Dr. Allyson Sabal as well for this and negative for dementia. -Continue outpatient follow-up with Dr. Allyson Sabal  Severe protein calorie malnutrition BMI 17 Albumin 1.6 on 12/09/2020. Encourage increase oral protein calorie intake. Dietary consult.  Resolved acute leukocytosis likely reactive Currently not on antibiotics  Leukocytosis has resolved.   No evidence of active infective process.   Urine  analysis and chest x-ray negative.    Cardiomegaly: Acute.   Chest x-ray noted cardiomegaly without signs of edema.  BNP was noted to be 213.71 on 2/25. Closely monitor volume status while on IV fluid. Continue strict intake and output Continue Daily weights -Follow-up echocardiogram  Proximal extremity weakness:  Patient has been being worked up in the outpatient setting by Dr. Allyson Sabal of neurology and appears to have extensive work-up done at the end of last year thyroid studies were noted to be within normal limits.  Recently had normal EMG studies. -PT/OT to evaluate and treat  -Transitions of care consult  Thrombocytosis: Acute.  Likely reactive. Platelet count is downtrending from 686, down to 580.   Chronic anxiety/depression:  Patient's daughter states that since being started on Lexapro patient has been doing better.   -Continue Lexapro for now  Secondary glaucoma -Continue regimen of eyedrops  Physical debility PT OT to assess Fall precautions.     Code Status: Full code.  Family Communication: Husband at bedside.  Disposition Plan: Likely will discharge to home on 12/10/2020.   Consultants:  GI  Hematology/medical oncology.  Procedures:  EGD  Colonoscopy.  Antimicrobials:  None.  DVT prophylaxis: SCDs.  Status is: Inpatient    Dispo:  Patient From: Home  Planned Disposition: Home with Health Care Svc  Anticipated discharge date 12/10/2020.  Medically stable for discharge: No, ongoing management of anemia and hypovolemic hyponatremia.          Objective: Vitals:   12/09/20 0857 12/09/20 1047 12/09/20 1109 12/09/20 1128  BP: (!) 179/64 (!) 176/67 (!) 184/59 (!) 156/56  Pulse: 85 73 72 77  Resp: 16 13 14 14   Temp: 97.9 F (36.6 C) 97.9 F (36.6 C)  97.8 F (36.6 C)  TempSrc: Temporal Temporal  Oral  SpO2: 97% 97% 97% 98%    Intake/Output Summary (Last 24 hours) at 12/09/2020 1612 Last data filed at 12/09/2020 1033 Gross per  24 hour  Intake 500 ml  Output 100 ml  Net 400 ml   There were no vitals filed for this visit.  Exam:  . General: 75 y.o. year-old female frail-chronically ill appearing in no acute distress.  Alert and minimally interactive. . Cardiovascular: Regular rate and rhythm with no rubs or gallops.  No thyromegaly or JVD noted.   Marland Kitchen Respiratory: Clear to auscultation with no wheezes or rales. Good inspiratory effort. . Abdomen: Soft nontender nondistended with normal bowel sounds x4 quadrants. . Musculoskeletal: No lower extremity edema bilaterally. . Skin: No ulcerative lesions noted or rashes. . Psychiatry: Flat affect.   Data Reviewed: CBC: Recent Labs  Lab 12/06/20 1341 12/07/20 1633 12/09/20 0203 12/09/20 0754  WBC 11.0* 11.3* 9.0  --   NEUTROABS 8.9*  --   --   --   HGB 8.7* 8.5* 7.4* 8.4*  HCT 28.4* 29.0* 24.6* 26.7*  MCV 79 79.5* 77.4*  --   PLT 666* 686* 550*  --    Basic Metabolic Panel: Recent Labs  Lab 12/06/20 1341 12/07/20 1633 12/08/20 0228 12/08/20 1112 12/08/20 1651 12/09/20 0203  NA 126* 125*  --  129* 129* 127*  K 5.2 4.2  --  3.6 3.6 3.1*  CL 91* 92*  --  97* 98 96*  CO2 22 25  --  24 24 23   GLUCOSE 113* 103*  --  112* 128* 122*  BUN 10 10  --  5* 9 <5*  CREATININE 0.39* 0.34*  --  <0.30* 0.51 <0.30*  CALCIUM 9.1 8.6*  --  8.3* 8.2* 8.2*  MG  --   --  1.7  --   --   --    GFR: CrCl cannot be calculated (This lab value cannot be used to calculate CrCl because it is not a number: <0.30). Liver Function Tests: Recent Labs  Lab 12/06/20 1341 12/08/20 1112 12/09/20 0203  AST 42* 29 28  ALT 60* 50* 46*  ALKPHOS 163* 115 121  BILITOT <0.2 0.4 0.5  PROT 7.2 6.1* 5.9*  ALBUMIN 2.7* 1.6* 1.6*   No results for input(s): LIPASE, AMYLASE in the last 168 hours. No results for input(s): AMMONIA in the last 168 hours. Coagulation Profile: No results for input(s): INR, PROTIME in the last 168 hours. Cardiac Enzymes: No results for input(s): CKTOTAL,  CKMB, CKMBINDEX, TROPONINI in the last 168 hours. BNP (last 3 results) No results for input(s): PROBNP in the last 8760 hours. HbA1C: No results for input(s): HGBA1C in the last 72 hours. CBG: No results for input(s): GLUCAP in the last 168 hours. Lipid Profile: No results for input(s): CHOL, HDL, LDLCALC, TRIG, CHOLHDL, LDLDIRECT in the last 72 hours. Thyroid Function Tests: Recent Labs    12/08/20 1112  FREET4 0.91   Anemia Panel: No results for input(s): VITAMINB12, FOLATE, FERRITIN, TIBC, IRON, RETICCTPCT in the last 72  hours. Urine analysis:    Component Value Date/Time   COLORURINE YELLOW 12/08/2020 0129   APPEARANCEUR HAZY (A) 12/08/2020 0129   APPEARANCEUR Clear 11/21/2020 1644   LABSPEC 1.019 12/08/2020 0129   PHURINE 6.0 12/08/2020 0129   GLUCOSEU NEGATIVE 12/08/2020 0129   HGBUR NEGATIVE 12/08/2020 0129   BILIRUBINUR NEGATIVE 12/08/2020 0129   BILIRUBINUR Negative 11/21/2020 1644   KETONESUR NEGATIVE 12/08/2020 0129   PROTEINUR NEGATIVE 12/08/2020 0129   UROBILINOGEN negative 05/13/2015 0827   NITRITE NEGATIVE 12/08/2020 0129   LEUKOCYTESUR NEGATIVE 12/08/2020 0129   Sepsis Labs: @LABRCNTIP (procalcitonin:4,lacticidven:4)  ) Recent Results (from the past 240 hour(s))  Resp Panel by RT-PCR (Flu A&B, Covid) Nasopharyngeal Swab     Status: None   Collection Time: 12/08/20  2:34 AM   Specimen: Nasopharyngeal Swab; Nasopharyngeal(NP) swabs in vial transport medium  Result Value Ref Range Status   SARS Coronavirus 2 by RT PCR NEGATIVE NEGATIVE Final    Comment: (NOTE) SARS-CoV-2 target nucleic acids are NOT DETECTED.  The SARS-CoV-2 RNA is generally detectable in upper respiratory specimens during the acute phase of infection. The lowest concentration of SARS-CoV-2 viral copies this assay can detect is 138 copies/mL. A negative result does not preclude SARS-Cov-2 infection and should not be used as the sole basis for treatment or other patient management  decisions. A negative result may occur with  improper specimen collection/handling, submission of specimen other than nasopharyngeal swab, presence of viral mutation(s) within the areas targeted by this assay, and inadequate number of viral copies(<138 copies/mL). A negative result must be combined with clinical observations, patient history, and epidemiological information. The expected result is Negative.  Fact Sheet for Patients:  EntrepreneurPulse.com.au  Fact Sheet for Healthcare Providers:  IncredibleEmployment.be  This test is no t yet approved or cleared by the [place] FDA and  has been authorized for detection and/or diagnosis of SARS-CoV-2 by FDA under an Emergency Use Authorization (EUA). This EUA will remain  in effect (meaning this test can be used) for the duration of the COVID-19 declaration under Section 564(b)(1) of the Act, 21 U.S.C.section 360bbb-3(b)(1), unless the authorization is terminated  or revoked sooner.       Influenza A by PCR NEGATIVE NEGATIVE Final   Influenza B by PCR NEGATIVE NEGATIVE Final    Comment: (NOTE) The Xpert Xpress SARS-CoV-2/FLU/RSV plus assay is intended as an aid in the diagnosis of influenza from Nasopharyngeal swab specimens and should not be used as a sole basis for treatment. Nasal washings and aspirates are unacceptable for Xpert Xpress SARS-CoV-2/FLU/RSV testing.  Fact Sheet for Patients: EntrepreneurPulse.com.au  Fact Sheet for Healthcare Providers: IncredibleEmployment.be  This test is not yet approved or cleared by the [place] FDA and has been authorized for detection and/or diagnosis of SARS-CoV-2 by FDA under an Emergency Use Authorization (EUA). This EUA will remain in effect (meaning this test can be used) for the duration of the COVID-19 declaration under Section 564(b)(1) of the Act, 21 U.S.C. section 360bbb-3(b)(1), unless the  authorization is terminated or revoked.  Performed at [facility], [place] [address], [place], [place] [address]       Studies: No results found.  Scheduled Meds: . amLODipine  5 mg Oral Daily  . timolol  1 drop Both Eyes BID   And  . brimonidine  1 drop Both Eyes BID  . dorzolamide  1 drop Both Eyes BID  . escitalopram  20 mg Oral Daily  . [START ON 12/10/2020] ferrous sulfate  325 mg Oral Q breakfast  . fluorometholone  1 drop Right Eye BID  . ketorolac  1 drop Right Eye Daily  . latanoprost  1 drop Both Eyes QHS  . linaclotide  145 mcg Oral QAC breakfast  . Netarsudil Dimesylate  1 drop Both Eyes BID  . sodium chloride flush  3 mL Intravenous Q12H    Continuous Infusions: . sodium chloride       LOS: 1 day     Kayleen Memos, MD [facility] Pager [phone]  If 7PM-7AM, please contact night-coverage [email or web address] Password [facility] 12/09/2020, 4:12 PM

## 2020-12-09 NOTE — Transfer of Care (Signed)
Immediate Anesthesia Transfer of Care Note  Patient: Mckenzie [name]  Procedure(s) Performed: COLONOSCOPY WITH PROPOFOL (N/A ) ESOPHAGOGASTRODUODENOSCOPY (EGD) WITH PROPOFOL (N/A )  Patient Location: Endoscopy Unit  Anesthesia Type:MAC  Level of Consciousness: drowsy and patient cooperative  Airway & Oxygen Therapy: Patient Spontanous Breathing  Post-op Assessment: Report given to RN and Post -op Vital signs reviewed and stable  Post vital signs: Reviewed and stable  Last Vitals:  Vitals Value Taken Time  BP 176/80   Temp    Pulse 74 12/09/20 1046  Resp 13 12/09/20 1046  SpO2 97 % 12/09/20 1046  Vitals shown include unvalidated device data.  Last Pain:  Vitals:   12/09/20 0857  TempSrc: Temporal  PainSc: 0-No pain         Complications: No complications documented.

## 2020-12-09 NOTE — Interval H&P Note (Signed)
History and Physical Interval Note:  12/09/2020 9:32 AM  Mckenzie [name]  has presented today for surgery, with the diagnosis of IDA.  The various methods of treatment have been discussed with the patient and family. After consideration of risks, benefits and other options for treatment, the patient has consented to  Procedure(s): COLONOSCOPY WITH PROPOFOL (N/A) ESOPHAGOGASTRODUODENOSCOPY (EGD) WITH PROPOFOL (N/A) as a surgical intervention.  The patient's history has been reviewed, patient examined, no change in status, stable for surgery.  I have reviewed the patient's chart and labs.  Questions were answered to the patient's satisfaction.     Silvano Rusk

## 2020-12-09 NOTE — Plan of Care (Signed)

## 2020-12-09 NOTE — Progress Notes (Signed)
Transfusion completed at this time , pt resting without distress, daughter at bedside

## 2020-12-09 NOTE — Progress Notes (Signed)
MD notified of decreased in pt H/H, potassium, sodium and BUN and creat from yesterday's labs. New order obtained to trend H/h q6h.

## 2020-12-09 NOTE — Anesthesia Preprocedure Evaluation (Addendum)
Anesthesia Evaluation  Patient identified by MRN, date of birth, ID band Patient awake    Reviewed: Allergy & Precautions, NPO status , Patient's Chart, lab work & pertinent test results  Airway Mallampati: II  TM Distance: >3 FB Neck ROM: Full    Dental no notable dental hx.    Pulmonary former smoker,    Pulmonary exam normal breath sounds clear to auscultation       Cardiovascular hypertension, Normal cardiovascular exam Rhythm:Regular Rate:Normal  ECG: SR, rate 82  ECHO: 1. Left ventricular ejection fraction, by estimation, is 65 to 70%. The left ventricle has normal function. The left ventricle has no regional wall motion abnormalities. Left ventricular diastolic parameters are consistent with Grade I diastolic dysfunction (impaired relaxation). 2. Right ventricular systolic function is normal. The right ventricular size is normal. There is normal pulmonary artery systolic pressure. The estimated right ventricular systolic pressure is 29.1 mmHg. 3. Left atrial size was moderately dilated. 4. Diastolic free wall collapse. 5. A small pericardial effusion is present. The pericardial effusion is circumferential. There is no evidence of cardiac tamponade. 6. The mitral valve is grossly normal. Trivial mitral valve regurgitation. 7. The aortic valve is tricuspid. Aortic valve regurgitation is not visualized. 8. The inferior vena cava is normal in size with greater than 50% respiratory variability, suggesting right atrial pressure of 3 mmHg.   Neuro/Psych PSYCHIATRIC DISORDERS Depression  Neuromuscular disease    GI/Hepatic negative GI ROS, Neg liver ROS,   Endo/Other  hyponatremia  Renal/GU negative Renal ROS     Musculoskeletal negative musculoskeletal ROS (+)   Abdominal   Peds  Hematology  (+) anemia ,   Anesthesia Other Findings IDA  Reproductive/Obstetrics                             Anesthesia Physical Anesthesia Plan  ASA: II  Anesthesia Plan: MAC   Post-op Pain Management:    Induction: Intravenous  PONV Risk Score and Plan: 2 and Propofol infusion and Treatment may vary due to age or medical condition  Airway Management Planned: Nasal Cannula  Additional Equipment:   Intra-op Plan:   Post-operative Plan:   Informed Consent: I have reviewed the patients History and Physical, chart, labs and discussed the procedure including the risks, benefits and alternatives for the proposed anesthesia with the patient or authorized representative who has indicated his/her understanding and acceptance.     Dental advisory given  Plan Discussed with: CRNA  Anesthesia Plan Comments:        Anesthesia Quick Evaluation

## 2020-12-09 NOTE — Progress Notes (Addendum)
2130 Pt to receive 1 unit PRBC's, pre VS taken at this time, consent was signed by daughter and placed in chart.  2205 Transfusion started at this time @ 150 ml/hr,  verified by Tammi Klippel LPN.  2220 Vs taken after 15 min with no transfusion reaction noted, daughter feeding pt meal at this time, will cont to monitor. Transfusion rate increased to 200 ml/hr.

## 2020-12-10 DIAGNOSIS — R531 Weakness: Secondary | ICD-10-CM | POA: Diagnosis not present

## 2020-12-10 DIAGNOSIS — E871 Hypo-osmolality and hyponatremia: Secondary | ICD-10-CM | POA: Diagnosis not present

## 2020-12-10 LAB — CBC WITH DIFFERENTIAL/PLATELET
Abs Immature Granulocytes: 0.04 10*3/uL (ref 0.00–0.07)
Basophils Absolute: 0 10*3/uL (ref 0.0–0.1)
Basophils Relative: 0 %
Eosinophils Absolute: 0.1 10*3/uL (ref 0.0–0.5)
Eosinophils Relative: 1 %
HCT: 28.9 % — ABNORMAL LOW (ref 36.0–46.0)
Hemoglobin: 9.4 g/dL — ABNORMAL LOW (ref 12.0–15.0)
Immature Granulocytes: 1 %
Lymphocytes Relative: 14 %
Lymphs Abs: 1.1 10*3/uL (ref 0.7–4.0)
MCH: 25.3 pg — ABNORMAL LOW (ref 26.0–34.0)
MCHC: 32.5 g/dL (ref 30.0–36.0)
MCV: 77.7 fL — ABNORMAL LOW (ref 80.0–100.0)
Monocytes Absolute: 0.7 10*3/uL (ref 0.1–1.0)
Monocytes Relative: 8 %
Neutro Abs: 6.4 10*3/uL (ref 1.7–7.7)
Neutrophils Relative %: 76 %
Platelets: 530 10*3/uL — ABNORMAL HIGH (ref 150–400)
RBC: 3.72 MIL/uL — ABNORMAL LOW (ref 3.87–5.11)
RDW: 15.5 % (ref 11.5–15.5)
WBC: 8.3 10*3/uL (ref 4.0–10.5)
nRBC: 0 % (ref 0.0–0.2)

## 2020-12-10 LAB — BPAM RBC
Blood Product Expiration Date: 202203262359
ISSUE DATE / TIME: 202203052155
Unit Type and Rh: 6200

## 2020-12-10 LAB — COMPREHENSIVE METABOLIC PANEL
ALT: 33 U/L (ref 0–44)
AST: 16 U/L (ref 15–41)
Albumin: 1.6 g/dL — ABNORMAL LOW (ref 3.5–5.0)
Alkaline Phosphatase: 108 U/L (ref 38–126)
Anion gap: 9 (ref 5–15)
BUN: 6 mg/dL — ABNORMAL LOW (ref 8–23)
CO2: 26 mmol/L (ref 22–32)
Calcium: 8.4 mg/dL — ABNORMAL LOW (ref 8.9–10.3)
Chloride: 96 mmol/L — ABNORMAL LOW (ref 98–111)
Creatinine, Ser: 0.34 mg/dL — ABNORMAL LOW (ref 0.44–1.00)
GFR, Estimated: >60 mL/min (ref >60)
Glucose, Bld: 120 mg/dL — ABNORMAL HIGH (ref 70–99)
Potassium: 3.4 mmol/L — ABNORMAL LOW (ref 3.5–5.1)
Sodium: 131 mmol/L — ABNORMAL LOW (ref 135–145)
Total Bilirubin: 1.2 mg/dL (ref 0.3–1.2)
Total Protein: 5.9 g/dL — ABNORMAL LOW (ref 6.5–8.1)

## 2020-12-10 LAB — TYPE AND SCREEN
ABO/RH(D): A POS
Antibody Screen: NEGATIVE
Unit division: 0

## 2020-12-10 LAB — SEDIMENTATION RATE: Sed Rate: 83 mm/hr — ABNORMAL HIGH (ref 0–22)

## 2020-12-10 LAB — C-REACTIVE PROTEIN: CRP: 11.5 mg/dL — ABNORMAL HIGH (ref <1.0)

## 2020-12-10 LAB — PROCALCITONIN: Procalcitonin: <0.1 ng/mL

## 2020-12-10 LAB — CK: Total CK: <5 U/L — ABNORMAL LOW (ref 38–234)

## 2020-12-10 LAB — LACTIC ACID, PLASMA: Lactic Acid, Venous: 0.8 mmol/L (ref 0.5–1.9)

## 2020-12-10 LAB — MAGNESIUM: Magnesium: 1.7 mg/dL (ref 1.7–2.4)

## 2020-12-10 LAB — PHOSPHORUS: Phosphorus: 4 mg/dL (ref 2.5–4.6)

## 2020-12-10 MED ORDER — LORAZEPAM 2 MG/ML IJ SOLN
0.5000 mg | Freq: Once | INTRAMUSCULAR | Status: AC
  Administered 2020-12-11: 0.5 mg via INTRAVENOUS
  Filled 2020-12-10: qty 1

## 2020-12-10 MED ORDER — ASPIRIN EC 81 MG PO TBEC
81.0000 mg | DELAYED_RELEASE_TABLET | Freq: Every day | ORAL | Status: DC
  Administered 2020-12-10 – 2020-12-19 (×10): 81 mg via ORAL
  Filled 2020-12-10 (×11): qty 1

## 2020-12-10 MED ORDER — MAGNESIUM OXIDE 400 (241.3 MG) MG PO TABS
800.0000 mg | ORAL_TABLET | Freq: Every day | ORAL | Status: DC
  Administered 2020-12-10 – 2020-12-11 (×2): 800 mg via ORAL
  Filled 2020-12-10 (×3): qty 2

## 2020-12-10 MED ORDER — POTASSIUM CHLORIDE CRYS ER 20 MEQ PO TBCR
40.0000 meq | EXTENDED_RELEASE_TABLET | Freq: Every day | ORAL | Status: DC
  Administered 2020-12-10: 40 meq via ORAL
  Filled 2020-12-10: qty 2

## 2020-12-10 NOTE — Progress Notes (Signed)
OT Cancellation Note  Patient Details Name: Mckenzie [name] MRN: [id number] DOB: September 09, 1946   Cancelled Treatment:    Reason Eval/Treat Not Completed: Other (comment). Daughter reports and pt endorses feeling fatigued from working with PT earlier. Pt is also currently dizzy. Plan to reattempt tomorrow.  Tyrone Schimke, OT Acute Rehabilitation Services Pager: [phone] Office: [phone]  12/10/2020, 2:12 PM

## 2020-12-10 NOTE — Consult Note (Signed)
Neurology Consultation  Reason for Consult: weakness, gait disturbance Referring Physician: Aileen Fass, MD  CC: generalized weakness, gait disturbance  History is obtained from: chart, daughter  HPI: Mckenzie [name] is a 75 y.o. 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 Per daughter at bedside, patient has had a steady decline in overall picture since hospital stay in December after a fall with wrist fracture. She now requires assistance with ADLs and ambulation. She has had periods of confusion, for example, remembering the order of activities required to toilet. Patient has been seen by Dr. Allyson Sabal, out patient neurologist, and had a complete workup for gait issues, pain in legs, and leg weakness, including normal nerve conduction studies, EMG with mild chronic left L4/S1 radiculopathy, MRI lumbar spine with some multilevel degenerative changes and moderate spinal stenosis.  EEG was notable for mild generalized slowing.  No etiology was found except depression and patient was started on this. Dr. Allyson Sabal did not find presentation consistent with dementia. Daughter states patient has had healing hands physical therapy.   On initial evaluation in December 2021, Dr. 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 Her Na level is trending upwards. Her WBCC was slightly elevated on admission, but no source of infection has been found, so this was likely reactive, as it has since normalized.   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 Also, small chronic microvascular ischemic changes. Her BMI is 17 and  albumin is low, so protein calorie malnutrition and adult failure to thrive is likely contributing to weakness. Her TSH, Vitamin B12 level, LA, CD were normal. Inflammatory markers are newly elevated since her initial admission for wrist fractures in December  Daughter stated that medicine suggested patient may have GCA or PMR given her inflammatory markers; in the setting neurology was consulted  PT exam today: recommendations to continue HHPT. Patient needs minimal assistance from sidelying to sitting. Assistance for overall bed mobility. Minimal assistance sit to stand and pivoting. Ambulated 3 feet with walkier with noted decreased step length.   Neurology asked to consult for weakness.  ROS: A 14 point ROS was performed and is negative except as noted in the HPI.   Past Medical History:  Diagnosis Date  . Depression   . High cholesterol   . Hypertension   . Retinal micro-aneurysm of right eye   glaucoma Macular degeneration   Family History  Problem Relation Age of Onset  . Hyperlipidemia Mother   . Hypertension Mother   . Diabetes Mother   . COPD Father   . Depression Sister   . Drug abuse Sister   . Suicidality Brother   . Colon cancer Neg Hx   . Celiac disease Neg Hx   . Inflammatory bowel disease Neg Hx     Social History:   reports that she has quit smoking. Her smoking use included cigarettes. She has never used smokeless tobacco. She reports that she does not drink alcohol and does not use drugs.  Medications  Current Facility-Administered Medications:  .  0.9 %  sodium chloride infusion, , Intravenous, Continuous, Hall, Carole N, DO, Last Rate: 50  mL/hr at 12/10/20 1404, New Bag at 12/10/20 1404 .  acetaminophen (TYLENOL) tablet 650 mg, 650 mg, Oral, Q6H PRN **OR** acetaminophen (TYLENOL) suppository 650 mg, 650 mg, Rectal, Q6H PRN, Gatha Mayer, MD .  amLODipine (NORVASC) tablet 2.5 mg, 2.5 mg, Oral, Daily, Hall, Carole N, DO, 2.5 mg at 12/10/20 0940 .   timolol (TIMOPTIC) 0.5 % ophthalmic solution 1 drop, 1 drop, Both Eyes, BID, 1 drop at 12/10/20 0941 **AND** brimonidine (ALPHAGAN) 0.2 % ophthalmic solution 1 drop, 1 drop, Both Eyes, BID, Gatha Mayer, MD, 1 drop at 12/10/20 0943 .  dorzolamide (TRUSOPT) 2 % ophthalmic solution 1 drop, 1 drop, Both Eyes, BID, Gatha Mayer, MD, 1 drop at 12/10/20 0941 .  escitalopram (LEXAPRO) tablet 20 mg, 20 mg, Oral, Daily, Gatha Mayer, MD, 20 mg at 12/10/20 0940 .  ferrous sulfate tablet 325 mg, 325 mg, Oral, Q breakfast, Irene Pap N, DO, 325 mg at 12/10/20 3354 .  fluorometholone (FML) 0.1 % ophthalmic suspension 1 drop, 1 drop, Right Eye, BID, Gatha Mayer, MD, 1 drop at 12/10/20 [phone] .  hydrALAZINE (APRESOLINE) injection 5 mg, 5 mg, Intravenous, Q6H PRN, Hall, Carole N, DO .  ketorolac (ACULAR) 0.5 % ophthalmic solution 1 drop, 1 drop, Right Eye, Daily, Gatha Mayer, MD, 1 drop at 12/08/20 1313 .  latanoprost (XALATAN) 0.005 % ophthalmic solution 1 drop, 1 drop, Both Eyes, QHS, Gatha Mayer, MD, 1 drop at 12/09/20 2200 .  linaclotide (LINZESS) capsule 145 mcg, 145 mcg, Oral, QAC breakfast, Gatha Mayer, MD, 145 mcg at 12/10/20 [phone] .  magnesium oxide (MAG-OX) tablet 800 mg, 800 mg, Oral, Daily, Irene Pap N, DO, 800 mg at 12/10/20 0644 .  Netarsudil Dimesylate 0.02 % SOLN 1 drop, 1 drop, Both Eyes, BID, Gatha Mayer, MD .  ondansetron [facility]) tablet 4 mg, 4 mg, Oral, Q6H PRN **OR** ondansetron (ZOFRAN) injection 4 mg, 4 mg, Intravenous, Q6H PRN, Gatha Mayer, MD .  polyvinyl alcohol (LIQUIFILM TEARS) 1.4 % ophthalmic solution 1 drop, 1 drop, Both Eyes, TID PRN, Gatha Mayer, MD .  potassium chloride SA (KLOR-CON) CR tablet 40 mEq, 40 mEq, Oral, Daily, Irene Pap N, DO, 40 mEq at 12/10/20 0644 .  sodium chloride flush (NS) 0.9 % injection 3 mL, 3 mL, Intravenous, Q12H, Gatha Mayer, MD, 3 mL at 12/09/20 2200   Exam: Current vital signs: BP (!) 164/65 (BP Location:  Left Arm)   Pulse 82   Temp 98.6 F (37 C)   Resp 20   SpO2 96%  Vital signs in last 24 hours: Temp:  [97.4 F (36.3 C)-98.6 F (37 C)] 98.6 F (37 C) (03/06 1559) Pulse Rate:  [72-88] 82 (03/06 1559) Resp:  [16-20] 20 (03/06 1559) BP: (155-167)/(61-70) 164/65 (03/06 1559) SpO2:  [96 %-97 %] 96 % (03/06 1559)  GENERAL: Awake, alert in NAD HEENT: - Normocephalic and atraumatic, dry mm, no LN++, no thyromegaly LUNGS - Normal respiratory effort.  CV - RRR  ABDOMEN - Soft, nontender Ext: warm, well perfused Psych: slight flat affect, but does smile. Does not appear anxious.   NEURO:  Mental Status: AA&Ox3  Speech/Language: speech is without dysarthria or aphasia. Naming, repetition, fluency, and comprehension intact. Cranial Nerves:  II: PERRL. visual fields full.  III, IV, VI: EOMI. Lid elevation symmetric and full.  V: sensation is intact and symmetrical to face. Moves jaw back and forth.  VII: Smile is symmetrical. Able to puff cheeks and  raise eyebrows.  VIII:hearing intact to voice IX, X: palate elevation is symmetric. Phonation normal.  XI: normal sternocleidomastoid and trapezius muscle strength JJK:[id number] is symmetrical without fasciculations.   Motor: 4+/5 in the deltoids bilaterally, 3/5 in bilateral hip flexors.  Otherwise 5/5 throughout Tone is normal. Bulk is notable for muscle wasting in the hands particularly.  Sensation- Intact to light touch bilaterally in all four extremities. Extinction absent to light touch to DSS.  She reports vibration is intact for about 10 seconds in the bilateral great toes.  No length dependent loss of cold sensation Coordination: FTN intact bilaterally. HKS intact bilaterally. No pronator drift.  DTRs: 3+ throughout.  Gait- deferred due to being called away for code stroke   Labs I have reviewed labs in epic and the results pertinent to this consultation are: ESR 112 down to 83   CRP 12.3 down to 11.5.   TSH 2.1 12/21  CK<5 I  cannot view results from visit in December but per Dr. Garth Bigness notes ESR was normal back in December  CBC    Component Value Date/Time   WBC 8.3 12/10/2020 0115   RBC 3.72 (L) 12/10/2020 0115   HGB 9.4 (L) 12/10/2020 0115   HGB 8.7 (L) 12/06/2020 1341   HCT 28.9 (L) 12/10/2020 0115   HCT 28.4 (L) 12/06/2020 1341   PLT 530 (H) 12/10/2020 0115   PLT 666 (H) 12/06/2020 1341   MCV 77.7 (L) 12/10/2020 0115   MCV 79 12/06/2020 1341   MCH 25.3 (L) 12/10/2020 0115   MCHC 32.5 12/10/2020 0115   RDW 15.5 12/10/2020 0115   RDW 14.0 12/06/2020 1341   LYMPHSABS 1.1 12/10/2020 0115   LYMPHSABS 1.1 12/06/2020 1341   MONOABS 0.7 12/10/2020 0115   EOSABS 0.1 12/10/2020 0115   EOSABS 0.1 12/06/2020 1341   BASOSABS 0.0 12/10/2020 0115   BASOSABS 0.0 12/06/2020 1341    CMP     Component Value Date/Time   NA 131 (L) 12/10/2020 0115   NA 126 (L) 12/06/2020 1341   K 3.4 (L) 12/10/2020 0115   CL 96 (L) 12/10/2020 0115   CO2 26 12/10/2020 0115   GLUCOSE 120 (H) 12/10/2020 0115   BUN 6 (L) 12/10/2020 0115   BUN 10 12/06/2020 1341   CREATININE 0.34 (L) 12/10/2020 0115   CREATININE 0.63 01/23/2018 0803   CALCIUM 8.4 (L) 12/10/2020 0115   PROT 5.9 (L) 12/10/2020 0115   PROT 7.2 12/06/2020 1341   ALBUMIN 1.6 (L) 12/10/2020 0115   ALBUMIN 2.7 (L) 12/06/2020 1341   AST 16 12/10/2020 0115   ALT 33 12/10/2020 0115   ALKPHOS 108 12/10/2020 0115   BILITOT 1.2 12/10/2020 0115   BILITOT <0.2 12/06/2020 1341   GFRNONAA >60 12/10/2020 0115   GFRNONAA 90 01/23/2018 0803   GFRAA 120 12/01/2020 1127   GFRAA 104 01/23/2018 0803    Lipid Panel     Component Value Date/Time   CHOL 151 01/04/2020 0852   CHOL 216 (H) 03/09/2013 1358   TRIG 89 01/04/2020 0852   TRIG 208 (H) 03/09/2013 1358   HDL 47 01/04/2020 0852   HDL 43 03/09/2013 1358   CHOLHDL 3.2 01/04/2020 0852   CHOLHDL 3.0 01/23/2018 0803   VLDL 19 01/07/2017 0800   LDLCALC 87 01/04/2020 0852   LDLCALC 69 01/23/2018 0803   LDLCALC 131  (H) 03/09/2013 1358     Imaging CT head Atrophy with small vessel chronic ischemic changes of deep cerebral white matter. Old BILATERAL cerebellar infarcts.  No acute intracranial abnormalities.  Assessment: 75 yo female with a steady decline in cognitive function and physical function x 3 months.  Though we are unable to review the primary records, it appears she has had a new increase in her inflammatory markers and she continues to have proximal greater than distal muscle weakness that is worse than previously documented on her last outpatient neurology evaluation.  Certainly there may be a contribution of malnutrition and deconditioning, however the new hyperreflexia on examination is concerning for potential cervical spine injury especially in the setting of her known lumbar degenerative disc disease.  Recommendations: -MRI cervical spine -Repeat routine EEG given prior finding of intermittent slowing -Neurology will continue to follow  Pt seen by Clance Boll, NP/Neuro and later by MD. Note/plan to be edited by MD as needed.  Pager: [phone]  Attending Neurologist's note:  I personally saw this patient, gathering history, performing a full neurologic examination, reviewing relevant labs, personally reviewing relevant imaging including CT head and formulated the assessment and plan, adding the note above for completeness and clarity to accurately reflect my thoughts

## 2020-12-10 NOTE — Progress Notes (Signed)
PROGRESS NOTE  Mckenzie [name] OEH:[id number] DOB: [birth year] DOA: 12/07/2020 PCP: Loman Brooklyn, FNP  HPI/Recap of past 24 hours: Mckenzie [name] is a 75 y.o. female with medical history significant of hypertension, hyperlipidemia,  chronic anxiety/depression who presents after being advised by her primary care provider to come in for low blood counts and low sodium levels.  Patient's daughter is present at bedside and provides much of the patient's history.  Apparently over the last 3 months patient has been dealing with weakness and confusion.  Last hospitalized on 10/05/2020 after having a fall, status post open reduction internal fixation of a left distal radial fracture.  Since that hospitalization patient daughter noted that she has had a progressive decline and now needs assistance to complete pretty much all of her ADLs.  It seems she was started on Lexapro 2 months prior for adjustment disorder with anxiety and depression.  Daughter notes that the medication has been helping and would like to continue this medication.  She has been followed by Dr. Allyson Sabal of neurology for worsening confusion and weakness.  Patient had been worked up for for her symptoms and had been told that she did not have dementia.  Also reports patient having EMG studies that were negative.  During this time patient sodium and hemoglobin levels have been trending down. No blood in stool, she has been on ferrous sulfate supplements.  She had followed up with [facility] and was scheduled to have a colonoscopy on 12/11/20.   Family is concerned about possible underlying malignancy.  In regards to her sodium levels PCP reportedly was concerned for SIADH and seemed to think Lexapro was less likely to blame.  Her daughter states that she has been eating well, but they have to make sure that she eats.  ED Course: WBC 11.3K, hemoglobin 8.5K, platelets 686K, sodium 125, chloride 92, calcium 8.6, and troponins negative x2.   Urinalysis negative for any signs of infection.    Her hemoglobin down trended to 7.4, iron studies suggestive of iron deficiency, negative FOBT.  She was seen by GI, she had an EGD and colonoscopy done on 12/09/2020 which findings did not explain her anemia.  She was also seen by hematology which was requested by family.  Hematology/medical oncology recommended to optimize hemoglobin at least 9.0.  She was transfused 1 unit PRBC on 12/09/2020, repeated hemoglobin this morning 9.4K from 8.0K.   Hospital course complicated by persistent generalized weakness for which she had a work-up that was initiated in the ED.  UA negative, CT head non acute showing old B/L cerebellar infarcts as well as Atrophy with small vessel chronic ischemic changes of deep cerebral white matter.  2D echo with normal LVEF 65-70%, no regional wall motion abnormalities, grade 1 diastolic dysfunction, negative troponin.  Electrolytes replaced with repeat serum K+ 3.8, magnesium 1.7, repleted.    Inflammatory markers returned significantly elevated CRP 12.3, ESR 112 on 12/09/2020.  Repeated inflammatory markers downtrending, CRP 11.5, sed rate 83 on 12/10/2020.  12/10/20: Patient was seen and examined at her bedside.  Her daughter was present.  She is more alert and interactive.  Her generalized weakness appears to be improving this morning.  Evaluated by PT with recommendation for home health PT.  Patient and daughter were not aware of prior stroke involving the cerebellum bilaterally.  Neurology consulted for further evaluation.    Assessment/Plan: Principal Problem:   Hyponatremia Active Problems:   Hypertension   Proximal weakness of extremity  Secondary glaucoma   Adjustment disorder with mixed anxiety and depressed mood   Anemia of chronic disease   Thrombocytosis   Hypoalbuminemia   Memory loss   Pressure injury of skin   Benign neoplasm of cecum   Benign neoplasm of descending colon   Hiatal hernia   Hypovolemic  hyponatremia: Improving with normal saline IV fluid hydration   Presented with serum sodium 125, uptrending 131. Currently on normal saline at 50 cc/h x 1 day. Repeat BMP this afternoon and in the morning.  Iron deficiency anemia, unclear etiology, no evidence of bleeding on EGD or colonoscopy. Hemoglobin dropped to 7.4 with an MCV of 77, FOBT negative. Iron studies suggestive of iron deficiency. Continue ferrous sulfate 325 mg daily. She had an endoscopy and colonoscopy done on 12/09/2020 which did not explain her anemia.  GI signed off. Seen by hematology, recommends to keep hemoglobin greater than 9.0.  Will follow-up outpatient. Hemoglobin 9.4K post 1 unit PRBC transfusion on 12/09/2020, from 8.0K. Continue to monitor H&H and keep hemoglobin greater than 9.0.  Elevated inflammatory markers, unclear etiology. Does not appear to be infective related with normal lactic acid level and normal procalcitonin, afebrile with no leukocytosis. Inflammatory markers returned significantly elevated CRP 12.3, ESR 112 on 12/09/2020.  Repeated inflammatory markers downtrending, CRP 11.5, sed rate 83 on 12/10/2020. Neurology has been consulted on 12/10/2020. Will likely need to follow-up with rheumatology outpatient.  Generalized weakness, suspect multifactorial secondary to hypokalemia, hypomagnesemia, anemia, and possibly inflammatory disorder. Spoke with the patient's daughter who is concerned about her generalized weakness.   UA negative, CT head non acute showing old B/L cerebellar infarcts as well as Atrophy with small vessel chronic ischemic changes of deep cerebral white matter.  2D echo with normal LVEF 65-70%, no regional wall motion abnormalities, negative troponin.  Electrolytes replaced with repeat serum K+ 3.8, magnesium 1.7, repleted. 1U PRBC transfused. Elevated inflammatory markers, rule out inflammatory disorder. CPK was not elevated less than 5.  History of prior bilateral cerebellar CVA  CT  head done on 12/08/2020 revealed old bilateral cerebral infarcts. Patient and daughter were not aware of prior stroke. Neurology consulted. Continue PT OT with assistance and fall precautions  Essential hypertension Blood pressure has been consistently elevated since her admission. Her husband has refused BP medication provided which was Norvasc 5 mg daily due to prior history of hypotension on her blood pressure medications. Okay to give blood pressure medications after discussing with her daughter on 12/10/2020.  Started on Norvasc 2.5 mg daily. Per patient and daughter she was taking lisinopril 10 mg a.m., 20 mg nightly and Norvasc 5 mg daily Continue to closely monitor vital signs and avoid hypotension.  Memory loss possibly secondary to her depression. Family reports patient has had intermittent confusion and memory loss.   She has been worked up by Dr. Allyson Sabal as well for this and negative for dementia. -Continue outpatient follow-up with Dr. Allyson Sabal  Severe protein calorie malnutrition BMI 17 Albumin 1.6 on 12/09/2020. Encourage increase oral protein calorie intake. Dietary consult.  Resolved acute leukocytosis likely reactive Currently not on antibiotics  Leukocytosis has resolved.   No evidence of active infective process.   Urine analysis and chest x-ray negative.   Procalcitonin and lactic acid negative.  Cardiomegaly: Acute.   Chest x-ray noted cardiomegaly without signs of edema.  BNP was noted to be 213.71 on 2/25. 2D echo done on 12/09/2020 showed LVEF 65 to 70% with grade 1 diastolic dysfunction. Continue strict I's and  O's and daily weight. Net I&O +2.2 L  Proximal extremity weakness:  Patient has been being worked up in the outpatient setting by Dr. Allyson Sabal of neurology and appears to have extensive work-up done at the end of last year thyroid studies were noted to be within normal limits.  Recently had normal EMG studies. -PT/assessment recommended home health  PT. Could not work with OT today due to dizziness, OT will reattempt tomorrow. TOC consulted to assist with home health services arrangement.  Thrombocytosis: Acute.  Likely reactive. Platelet count is downtrending from 686, down to 580, down to 530 K.   Chronic anxiety/depression:  Stable at the time of this exam. Continue home Lexapro.  Secondary glaucoma -Continue regimen of eyedrops  Physical debility Continue PT OT with assistance Continue fall precautions.     Code Status: Full code.  Family Communication: Updated her daughter at bedside.  Disposition Plan: Likely will discharge to home on 12/12/2020.   Consultants:  GI  Hematology/medical oncology.  Neurology on 12/10/2020.  Procedures:  EGD  Colonoscopy.  Antimicrobials:  None.  DVT prophylaxis: SCDs.  Status is: Inpatient    Dispo:  Patient From: Home  Planned Disposition: Home with Health Care Svc  Anticipated discharge date 12/12/2020 or when neurology signs off.  Medically stable for discharge: No, ongoing management of anemia and hypovolemic hyponatremia.          Objective: Vitals:   12/09/20 2130 12/09/20 2220 12/10/20 0009 12/10/20 0438  BP: (!) 164/65 (!) 156/62 (!) 155/61 (!) 167/70  Pulse: 78 80 72 88  Resp: _0 Temp: 97.8 F (36.6 C) (!) 97.4 F (36.3 C) 97.7 F (36.5 C) 98.1 F (36.7 C)  TempSrc: Oral Oral Oral   SpO2: 97% 97% 96% 97%    Intake/Output Summary (Last 24 hours) at 12/10/2020 1348 Last data filed at 12/10/2020 0300 Gross per 24 hour  Intake 1026.7 ml  Output -  Net 1026.7 ml   There were no vitals filed for this visit.  Exam:  . General: 75 y.o. year-old female frail-appearing in no acute distress.  She is alert and interactive.   . Cardiovascular: Regular rate and rhythm no rubs or gallops.   Marland Kitchen Respiratory: Clear to auscultation no wheezes or rales.   . Abdomen: Soft nontender no bowel sounds present.   . Musculoskeletal: No lower  extremity edema bilaterally. . Skin: No ulcerative lesions noted.   Marland Kitchen Psychiatry: Mood is appropriate for condition and setting.   Data Reviewed: CBC: Recent Labs  Lab 12/06/20 1341 12/07/20 1633 12/09/20 0203 12/09/20 0754 12/09/20 1652 12/10/20 0115  WBC 11.0* 11.3* 9.0  --   --  8.3  NEUTROABS 8.9*  --   --   --   --  6.4  HGB 8.7* 8.5* 7.4* 8.4* 8.0* 9.4*  HCT 28.4* 29.0* 24.6* 26.7* 26.8* 28.9*  MCV 79 79.5* 77.4*  --   --  77.7*  PLT 666* 686* 550*  --   --  696*   Basic Metabolic Panel: Recent Labs  Lab 12/08/20 0228 12/08/20 1112 12/08/20 1651 12/09/20 0203 12/09/20 1652 12/10/20 0115  NA  --  129* 129* 127* 129* 131*  K  --  3.6 3.6 3.1* 3.8 3.4*  CL  --  97* 98 96* 98 96*  CO2  --  _1 GLUCOSE  --  112* 128* 122* 100* 120*  BUN  --  5* 9 <5* <5* 6*  CREATININE  --  <  0.30* 0.51 <0.30* 0.32* 0.34*  CALCIUM  --  8.3* 8.2* 8.2* 8.4* 8.4*  MG 1.7  --   --   --   --  1.7  PHOS  --   --   --   --   --  4.0   GFR: Estimated Creatinine Clearance: 45.7 mL/min (A) (by C-G formula based on SCr of 0.34 mg/dL (L)). Liver Function Tests: Recent Labs  Lab 12/06/20 1341 12/08/20 1112 12/09/20 0203 12/10/20 0115  AST 42* _0 ALT 60* 50* 46* 33  ALKPHOS 163* 115 121 108  BILITOT <0.2 0.4 0.5 1.2  PROT 7.2 6.1* 5.9* 5.9*  ALBUMIN 2.7* 1.6* 1.6* 1.6*   No results for input(s): LIPASE, AMYLASE in the last 168 hours. No results for input(s): AMMONIA in the last 168 hours. Coagulation Profile: No results for input(s): INR, PROTIME in the last 168 hours. Cardiac Enzymes: Recent Labs  Lab 12/10/20 0543  CKTOTAL <5*   BNP (last 3 results) No results for input(s): PROBNP in the last 8760 hours. HbA1C: No results for input(s): HGBA1C in the last 72 hours. CBG: No results for input(s): GLUCAP in the last 168 hours. Lipid Profile: No results for input(s): CHOL, HDL, LDLCALC, TRIG, CHOLHDL, LDLDIRECT in the last 72 hours. Thyroid Function  Tests: Recent Labs    12/08/20 1112  FREET4 0.91   Anemia Panel: No results for input(s): VITAMINB12, FOLATE, FERRITIN, TIBC, IRON, RETICCTPCT in the last 72 hours. Urine analysis:    Component Value Date/Time   COLORURINE YELLOW 12/08/2020 0129   APPEARANCEUR HAZY (A) 12/08/2020 0129   APPEARANCEUR Clear 11/21/2020 1644   LABSPEC 1.019 12/08/2020 0129   PHURINE 6.0 12/08/2020 0129   GLUCOSEU NEGATIVE 12/08/2020 0129   HGBUR NEGATIVE 12/08/2020 0129   BILIRUBINUR NEGATIVE 12/08/2020 0129   BILIRUBINUR Negative 11/21/2020 1644   KETONESUR NEGATIVE 12/08/2020 0129   PROTEINUR NEGATIVE 12/08/2020 0129   UROBILINOGEN negative 05/13/2015 0827   NITRITE NEGATIVE 12/08/2020 0129   LEUKOCYTESUR NEGATIVE 12/08/2020 0129   Sepsis Labs: _1 (procalcitonin:4,lacticidven:4)  ) Recent Results (from the past 240 hour(s))  Resp Panel by RT-PCR (Flu A&B, Covid) Nasopharyngeal Swab     Status: None   Collection Time: 12/08/20  2:34 AM   Specimen: Nasopharyngeal Swab; Nasopharyngeal(NP) swabs in vial transport medium  Result Value Ref Range Status   SARS Coronavirus 2 by RT PCR NEGATIVE NEGATIVE Final    Comment: (NOTE) SARS-CoV-2 target nucleic acids are NOT DETECTED.  The SARS-CoV-2 RNA is generally detectable in upper respiratory specimens during the acute phase of infection. The lowest concentration of SARS-CoV-2 viral copies this assay can detect is 138 copies/mL. A negative result does not preclude SARS-Cov-2 infection and should not be used as the sole basis for treatment or other patient management decisions. A negative result may occur with  improper specimen collection/handling, submission of specimen other than nasopharyngeal swab, presence of viral mutation(s) within the areas targeted by this assay, and inadequate number of viral copies(<138 copies/mL). A negative result must be combined with clinical observations, patient history, and epidemiological information.  The expected result is Negative.  Fact Sheet for Patients:  EntrepreneurPulse.com.au  Fact Sheet for Healthcare Providers:  IncredibleEmployment.be  This test is no t yet approved or cleared by the [place] FDA and  has been authorized for detection and/or diagnosis of SARS-CoV-2 by FDA under an Emergency Use Authorization (EUA). This EUA will remain  in effect (meaning this test can be used) for the duration  of the COVID-19 declaration under Section 564(b)(1) of the Act, 21 U.S.C.section 360bbb-3(b)(1), unless the authorization is terminated  or revoked sooner.       Influenza A by PCR NEGATIVE NEGATIVE Final   Influenza B by PCR NEGATIVE NEGATIVE Final    Comment: (NOTE) The Xpert Xpress SARS-CoV-2/FLU/RSV plus assay is intended as an aid in the diagnosis of influenza from Nasopharyngeal swab specimens and should not be used as a sole basis for treatment. Nasal washings and aspirates are unacceptable for Xpert Xpress SARS-CoV-2/FLU/RSV testing.  Fact Sheet for Patients: EntrepreneurPulse.com.au  Fact Sheet for Healthcare Providers: IncredibleEmployment.be  This test is not yet approved or cleared by the [place] FDA and has been authorized for detection and/or diagnosis of SARS-CoV-2 by FDA under an Emergency Use Authorization (EUA). This EUA will remain in effect (meaning this test can be used) for the duration of the COVID-19 declaration under Section 564(b)(1) of the Act, 21 U.S.C. section 360bbb-3(b)(1), unless the authorization is terminated or revoked.  Performed at [facility], [place] [address] [place], [place] [address]       Studies: No results found.  Scheduled Meds: . amLODipine  2.5 mg Oral Daily  . timolol  1 drop Both Eyes BID   And  . brimonidine  1 drop Both Eyes BID  . dorzolamide  1 drop Both Eyes BID  . escitalopram  20 mg Oral Daily  . ferrous sulfate   325 mg Oral Q breakfast  . fluorometholone  1 drop Right Eye BID  . ketorolac  1 drop Right Eye Daily  . latanoprost  1 drop Both Eyes QHS  . linaclotide  145 mcg Oral QAC breakfast  . magnesium oxide  800 mg Oral Daily  . Netarsudil Dimesylate  1 drop Both Eyes BID  . potassium chloride  40 mEq Oral Daily  . sodium chloride flush  3 mL Intravenous Q12H    Continuous Infusions: . sodium chloride 50 mL/hr at 12/09/20 1734     LOS: 2 days     Kayleen Memos, MD [facility] Pager [phone]  If 7PM-7AM, please contact night-coverage [email or web address] Password [email or web address] 12/10/2020, 1:48 PM

## 2020-12-10 NOTE — Evaluation (Addendum)
Physical Therapy Evaluation and Discharge Patient Details Name: Mckenzie [name] MRN: [id number] DOB: 1946/02/28 Today's Date: 12/10/2020   History of Present Illness  Pt is a 75 y.o. female who presented to the hospital 12/08/20 after being advised by her PCP to come in for low blood counts and sodium levels. Of note, over past 3 months since her fall, hospitalziation, and s/p ORIF of her L distal radial fx she has progressively declined functionally and cognitively. Per chart, pt does not have dementia. 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 PMH: HTN, depression, high cholesterol, and retinal micro-aneurysm of R eye.  Clinical Impression  Pt presents with condition above and deficits mentioned below, see PT Problem List. Per daughter, pt has declined functionally and cognitively since her fall several months ago and has good and bad days in which she ranges from only needing close supervision to get out of bed and walk a lap around the home (~3 min duration) and then other days have extreme difficulty walking at all. Pt also has spontaneous bouts of lightheadedness with mobility PTA and needs +2 for negotiating stairs. Currently, pt appears to be functioning at baseline, needing minA to sit up in bed and come to stand and take several side steps to the chair with a RW today. Pt limited in mobility this date due to lightheadedness (denies "spinning"). Pt tested negative with bil Dix Hallpike tests and denies increase in signs/symptoms. She does display increased head shaking and slight nystagmus in her eyes when sitting or standing statically with a forward gaze though. See General Comments below in regards to her BP. Pt and her daughter were educated on continuing with her HEP and getting up and walking with nursing staff while in hospital to prevent further functional decline. All education completed and questions answered. Recommending pt continue to follow-up with her [facility] PT upon d/c. Acute PT will  sign off at this time.    Follow Up Recommendations Home health PT    Equipment Recommendations  None recommended by PT    Recommendations for Other Services       Precautions / Restrictions Precautions Precautions: Fall Restrictions Weight Bearing Restrictions: No      Mobility  Bed Mobility Overal bed mobility: Needs Assistance Bed Mobility: Rolling;Sidelying to Sit Rolling: Min guard Sidelying to sit: Min assist       General bed mobility comments: Bed flat, pt utilizing bed rails. Pt needing extra time and cues for techniques. MinA at trunk to initiate trunk ascent to sit up.    Transfers Overall transfer level: Needs assistance   Transfers: Sit to/from Stand;Stand Pivot Transfers Sit to Stand: Min assist Stand pivot transfers: Min assist       General transfer comment: MinA for safety due to reports of lightheadedness and noted slight nystagmus and shaking of head. Extra time to come to stand to RW from bed and stand step to R to recliner. No LOB.  Ambulation/Gait Ambulation/Gait assistance: Min assist Gait Distance (Feet): 3 Feet Assistive device: Rolling walker (2 wheeled) Gait Pattern/deviations: Decreased step length - right;Decreased step length - left;Decreased stride length;Shuffle Gait velocity: reduced Gait velocity interpretation: <1.31 ft/sec, indicative of household ambulator General Gait Details: Side stepping to R bed > recliner with RW with decreased bil step length and mild unsteadiness as pt reporting lightheadedness and displaying nystagmus with shaking of head. MinA for safety.  Stairs            Emergency planning/management officer  Modified Rankin (Stroke Patients Only) Modified Rankin (Stroke Patients Only) Pre-Morbid Rankin Score: Moderately severe disability Modified Rankin: Moderately severe disability     Balance Overall balance assessment: Needs assistance Sitting-balance support: Bilateral upper extremity supported;Feet  supported Sitting balance-Leahy Scale: Poor Sitting balance - Comments: Static sitting EOB min guard.   Standing balance support: Bilateral upper extremity supported;During functional activity Standing balance-Leahy Scale: Poor Standing balance comment: Reliant on bil UEs on RW and external support for standing balance.                             Pertinent Vitals/Pain Pain Assessment: No/denies pain    Home Living Family/patient expects to be discharged to:: Private residence Living Arrangements: Spouse/significant other (children have been staying to assist as well recently) Available Help at Discharge: Family;Available 24 hours/day Type of Home: House Home Access: Stairs to enter Entrance Stairs-Rails: None (hoping to build one during this admission) Entrance Stairs-Number of Steps: 4 Home Layout: One level Home Equipment: Shower seat;Grab bars - tub/shower;Bedside commode;Walker - standard      Prior Function Level of Independence: Needs assistance   Gait / Transfers Assistance Needed: Pt's daughter reports pt has good and bad days and normally needs minA to get out of bed initially in the day due to stiffness then minA-min guard for all functional mobility with a walker. Pt able to ambulate lap around house but needs close supervision for safety as some days she gets dizzy spontaneously. Needs +2 for stairs.     Comments: several falls in past year     Hand Dominance   Dominant Hand: Right    Extremity/Trunk Assessment   Upper Extremity Assessment Upper Extremity Assessment: Generalized weakness;Defer to OT evaluation    Lower Extremity Assessment Lower Extremity Assessment: Generalized weakness       Communication   Communication: No difficulties  Cognition Arousal/Alertness: Awake/alert Behavior During Therapy: WFL for tasks assessed/performed Overall Cognitive Status: History of cognitive impairments - at baseline                                  General Comments: A&Ox4 but displays some STM deficits through asking her daughter questions about locations of her other family members repeatedly during session.      General Comments General comments (skin integrity, edema, etc.): BP 170s/60s sitting EOB (reported lightheadness when she sat up), BP 160s/60s once sitting following transfer to chair (increased lightheadness standing); (-) bil Dix Hallpike tests    Exercises     Assessment/Plan    PT Assessment All further PT needs can be met in the next venue of care  PT Problem List Decreased strength;Decreased activity tolerance;Decreased balance;Decreased mobility;Decreased range of motion;Decreased coordination;Decreased cognition;Decreased knowledge of use of DME;Decreased safety awareness;Decreased knowledge of precautions       PT Treatment Interventions      PT Goals (Current goals can be found in the Care Plan section)  Acute Rehab PT Goals Patient Stated Goal: to improve PT Goal Formulation: With patient/family Time For Goal Achievement: 12/11/20 Potential to Achieve Goals: Fair    Frequency     Barriers to discharge        Co-evaluation               AM-PAC PT "6 Clicks" Mobility  Outcome Measure Help needed turning from your back to your side while in a flat  bed without using bedrails?: A Little Help needed moving from lying on your back to sitting on the side of a flat bed without using bedrails?: A Little Help needed moving to and from a bed to a chair (including a wheelchair)?: A Little Help needed standing up from a chair using your arms (e.g., wheelchair or bedside chair)?: A Little Help needed to walk in hospital room?: A Little Help needed climbing 3-5 steps with a railing? : A Lot 6 Click Score: 17    End of Session Equipment Utilized During Treatment: Gait belt Activity Tolerance: Treatment limited secondary to medical complications (Comment) (lightheadedness) Patient left: in  chair;with call bell/phone within reach;with chair alarm set;with family/visitor present Nurse Communication: Mobility status PT Visit Diagnosis: Unsteadiness on feet (R26.81);Other abnormalities of gait and mobility (R26.89);Muscle weakness (generalized) (M62.81);History of falling (Z91.81);Difficulty in walking, not elsewhere classified (R26.2);Dizziness and giddiness (R42)    Time: 4496-7591 PT Time Calculation (min) (ACUTE ONLY): 44 min   Charges:   PT Evaluation $PT Eval Moderate Complexity: 1 Mod PT Treatments $Therapeutic Activity: 23-37 mins        Moishe Spice, PT, DPT Acute Rehabilitation Services  Pager: [phone] Office: [phone]   Orvan Falconer 12/10/2020, 1:09 PM

## 2020-12-11 ENCOUNTER — Encounter (HOSPITAL_COMMUNITY): Payer: Self-pay | Admitting: Internal Medicine

## 2020-12-11 ENCOUNTER — Inpatient Hospital Stay (HOSPITAL_COMMUNITY): Payer: Medicare Other

## 2020-12-11 ENCOUNTER — Encounter (HOSPITAL_COMMUNITY)
Admission: EM | Disposition: A | Discharge status: Home Health | Payer: Self-pay | Source: Home / Self Care | Attending: Internal Medicine

## 2020-12-11 ENCOUNTER — Ambulatory Visit (HOSPITAL_COMMUNITY): Admission: RE | Admit: 2020-12-11 | Payer: Medicare Other | Source: Home / Self Care

## 2020-12-11 DIAGNOSIS — R531 Weakness: Secondary | ICD-10-CM | POA: Diagnosis not present

## 2020-12-11 DIAGNOSIS — E871 Hypo-osmolality and hyponatremia: Secondary | ICD-10-CM | POA: Diagnosis not present

## 2020-12-11 DIAGNOSIS — E440 Moderate protein-calorie malnutrition: Secondary | ICD-10-CM | POA: Insufficient documentation

## 2020-12-11 LAB — BASIC METABOLIC PANEL
Anion gap: 10 (ref 5–15)
Anion gap: 9 (ref 5–15)
BUN: 9 mg/dL (ref 8–23)
BUN: 7 mg/dL — ABNORMAL LOW (ref 8–23)
CO2: 23 mmol/L (ref 22–32)
CO2: 25 mmol/L (ref 22–32)
Calcium: 8.1 mg/dL — ABNORMAL LOW (ref 8.9–10.3)
Calcium: 7.9 mg/dL — ABNORMAL LOW (ref 8.9–10.3)
Chloride: 94 mmol/L — ABNORMAL LOW (ref 98–111)
Chloride: 96 mmol/L — ABNORMAL LOW (ref 98–111)
Creatinine, Ser: 0.37 mg/dL — ABNORMAL LOW (ref 0.44–1.00)
Creatinine, Ser: 0.33 mg/dL — ABNORMAL LOW (ref 0.44–1.00)
GFR, Estimated: >60 mL/min (ref >60)
GFR, Estimated: >60 mL/min (ref >60)
Glucose, Bld: 115 mg/dL — ABNORMAL HIGH (ref 70–99)
Glucose, Bld: 153 mg/dL — ABNORMAL HIGH (ref 70–99)
Potassium: 3.3 mmol/L — ABNORMAL LOW (ref 3.5–5.1)
Potassium: 3.1 mmol/L — ABNORMAL LOW (ref 3.5–5.1)
Sodium: 127 mmol/L — ABNORMAL LOW (ref 135–145)
Sodium: 130 mmol/L — ABNORMAL LOW (ref 135–145)

## 2020-12-11 LAB — CBC
HCT: 28.7 % — ABNORMAL LOW (ref 36.0–46.0)
Hemoglobin: 9.3 g/dL — ABNORMAL LOW (ref 12.0–15.0)
MCH: 25.4 pg — ABNORMAL LOW (ref 26.0–34.0)
MCHC: 32.4 g/dL (ref 30.0–36.0)
MCV: 78.4 fL — ABNORMAL LOW (ref 80.0–100.0)
Platelets: 580 10*3/uL — ABNORMAL HIGH (ref 150–400)
RBC: 3.66 MIL/uL — ABNORMAL LOW (ref 3.87–5.11)
RDW: 15.7 % — ABNORMAL HIGH (ref 11.5–15.5)
WBC: 10.4 10*3/uL (ref 4.0–10.5)
nRBC: 0 % (ref 0.0–0.2)

## 2020-12-11 LAB — MAGNESIUM
Magnesium: 1.6 mg/dL — ABNORMAL LOW (ref 1.7–2.4)
Magnesium: 2 mg/dL (ref 1.7–2.4)

## 2020-12-11 LAB — LIPID PANEL
Cholesterol: 127 mg/dL (ref 0–200)
HDL: 27 mg/dL — ABNORMAL LOW (ref >40)
LDL Cholesterol: 82 mg/dL (ref 0–99)
Total CHOL/HDL Ratio: 4.7 RATIO
Triglycerides: 88 mg/dL (ref <150)
VLDL: 18 mg/dL (ref 0–40)

## 2020-12-11 LAB — ANA: Anti Nuclear Antibody (ANA): NEGATIVE

## 2020-12-11 SURGERY — COLONOSCOPY WITH PROPOFOL
Anesthesia: Monitor Anesthesia Care

## 2020-12-11 MED ORDER — GADOBUTROL 1 MMOL/ML IV SOLN
5.0000 mL | Freq: Once | INTRAVENOUS | Status: AC | PRN
  Administered 2020-12-11: 5 mL via INTRAVENOUS

## 2020-12-11 MED ORDER — MAGNESIUM SULFATE 2 GM/50ML IV SOLN
2.0000 g | Freq: Once | INTRAVENOUS | Status: AC
  Administered 2020-12-11: 2 g via INTRAVENOUS
  Filled 2020-12-11: qty 50

## 2020-12-11 MED ORDER — SODIUM CHLORIDE 1 G PO TABS
1.0000 g | ORAL_TABLET | Freq: Two times a day (BID) | ORAL | Status: DC
  Administered 2020-12-11 (×2): 1 g via ORAL
  Filled 2020-12-11 (×3): qty 1

## 2020-12-11 MED ORDER — SODIUM CHLORIDE 0.9 % IV SOLN: INTRAVENOUS | Status: DC

## 2020-12-11 MED ORDER — POTASSIUM CHLORIDE CRYS ER 20 MEQ PO TBCR: 40.0000 meq | EXTENDED_RELEASE_TABLET | Freq: Two times a day (BID) | ORAL | Status: DC

## 2020-12-11 MED ORDER — ENSURE ENLIVE PO LIQD
237.0000 mL | Freq: Two times a day (BID) | ORAL | Status: DC
  Administered 2020-12-11 – 2020-12-14 (×8): 237 mL via ORAL

## 2020-12-11 MED ORDER — ADULT MULTIVITAMIN W/MINERALS CH
1.0000 | ORAL_TABLET | Freq: Every day | ORAL | Status: DC
  Administered 2020-12-11 – 2020-12-19 (×9): 1 via ORAL
  Filled 2020-12-11 (×9): qty 1

## 2020-12-11 MED ORDER — MAGNESIUM OXIDE 400 (241.3 MG) MG PO TABS
800.0000 mg | ORAL_TABLET | Freq: Every day | ORAL | Status: AC
  Administered 2020-12-11 – 2020-12-12 (×2): 800 mg via ORAL
  Filled 2020-12-11 (×3): qty 2

## 2020-12-11 MED ORDER — POTASSIUM CHLORIDE CRYS ER 20 MEQ PO TBCR
40.0000 meq | EXTENDED_RELEASE_TABLET | Freq: Two times a day (BID) | ORAL | Status: DC
  Administered 2020-12-11: 40 meq via ORAL
  Filled 2020-12-11: qty 2

## 2020-12-11 MED ORDER — IOHEXOL 350 MG/ML SOLN
100.0000 mL | Freq: Once | INTRAVENOUS | Status: AC | PRN
  Administered 2020-12-11: 100 mL via INTRAVENOUS

## 2020-12-11 MED ORDER — POTASSIUM CHLORIDE CRYS ER 20 MEQ PO TBCR
40.0000 meq | EXTENDED_RELEASE_TABLET | Freq: Two times a day (BID) | ORAL | Status: DC
  Filled 2020-12-11 (×2): qty 2

## 2020-12-11 NOTE — Progress Notes (Addendum)
Neurology Progress Note Reason for Consult: weakness, gait disturbance Referring Physician: Aileen Fass, MD  CC: generalized weakness, gait disturbance  Subjective: Mckenzie [name] is a 75 y.o. 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 Daughter is again at bedside. On exam today the patient reports that she is not having any back pain, but is having some pain on the left posterior area of her neck which is worse when she turns her head and with palpation of the area. Patient denies any pain to her temporal region or along her spinous process. Patient's daughter reports that her mother has been saying that she is having neck pain and has been noted to have a steady decline in strength and has been having more problems with going from sitting to standing and going up stairs. Patient continues to have weakness, but was able to feed herself this AM.   ROS: Negative unless otherwise stated above. Exam: Vitals:   12/10/20 2037 12/11/20 0942  BP: (!) 157/61 (!) 155/74  Pulse: 88   Resp: 20   Temp: 98.3 F (36.8 C)   SpO2: 96%    Gen: In bed, NAD CV: Distal pulses appropriate Resp: non-labored breathing, no acute distress Abd: soft, nt Skin: Warm, dry, no rash noted  Neuro: MS: BUE flexors 4+/5, BUE extensors 4/5 BLE flexors 3-/5, Bilateral dosiflexion 4/5 , Bilateral plantar flexion 4/5 Mental Status: AA&Ox3  Speech/Language: speech is without dysarthria or aphasia. Naming, repetition, fluency, and comprehension intact. Cranial Nerves:  II: PERRL. visual fields full.  III, IV, VI: EOMI. Lid elevation symmetric and full.  V: sensation is intact and symmetrical to face. Moves jaw back and forth.  VII: Smile is symmetrical. Able to puff cheeks and raise eyebrows.  VIII:hearing intact to voice IX, X: palate elevation is symmetric. Phonation normal.  XI: normal sternocleidomastoid and trapezius muscle strength HRC:BULAGT  is symmetrical without fasciculations.   Sensory: Intact to light touch bilaterally DTR:3+ throughout, negative jaw jerk  Pertinent Labs: ESR: 83 BMP Latest Ref Rng & Units 12/11/2020 12/10/2020 12/09/2020  Glucose 70 - 99 mg/dL 115(H) 120(H) 100(H)  BUN 8 - 23 mg/dL 9 6(L) <5(L)  Creatinine 0.44 - 1.00 mg/dL 0.37(L) 0.34(L) 0.32(L)  BUN/Creat Ratio 12 - 28 - - -  Sodium 135 - 145 mmol/L 127(L) 131(L) 129(L)  Potassium 3.5 - 5.1 mmol/L 3.3(L) 3.4(L) 3.8  Chloride 98 - 111 mmol/L 94(L) 96(L) 98  CO2 22 - 32 mmol/L '23 26 23  ' Calcium 8.9 - 10.3 mg/dL 8.1(L) 8.4(L) 8.4(L)   CBC Latest Ref Rng & Units 12/10/2020 12/09/2020 12/09/2020  WBC 4.0 - 10.5 K/uL 8.3 - -  Hemoglobin 12.0 - 15.0 g/dL 9.4(L) 8.0(L) 8.4(L)  Hematocrit 36.0 - 46.0 % 28.9(L) 26.8(L) 26.7(L)  Platelets 150 - 400 K/uL 530(H) - -   Impression:  Mckenzie [name] is a 75 y.o. 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 On exam today the patient continues to have proximal muscle weakness most obvious in the lower extremities bilaterally. Patient ESR is elevated and patient is complaining of proximal muscle pain with movement in her neck as well as hx of proximal muscles weakness > distal muscle. Patient had CT Abdomen Pelvis in the past decreasing concern for malignancy in this area and CT head was negative for abnormal acute intracranial process. GCA and PMR remains a concern; however patient MR Cervical spine was also concerning  for a 2x1x1 cm fluid collection at the C7. There is concern that this could be an abscess and are reaching out to IR for possible sample. At this time patient remains afebrile and WBC remains WNL decreasing concern for infection; however as the treatment for GCA and PMR would be steroids sample of the fluid collection would be preferred to rule out infection prior to starting. Will also order CTA Chest to further investigate for GCA. Patient elevated  ESR and dx of anemia of chronic disease would further support auto- immune inflammatory process. Patient and family also reported concern about patient starting steroids if necessary as the patient has been instructed by her opthamologist Dr. Zigmund Daniel and/or Dr. Katy Fitch to not take steroids in the future as she has had the adverse reaction of increase IOP in the past and there is concern for loss of vision with steroids for patient.  Patient also continues to be on Lexapro at this time. May need to consider this as a possible etiology for hyponatremia and recommend patient follow-up with her OP Psychiatrist, Dr. Modesta Messing.   Recommendations: 1) F/u CTA Chest 2) Consult to IR 3) F/u w/ patient's OP Opthamologist concerning possible need for steroids 40 F/u ANA and Protein Electrophroresis - Neurology will continue to follow  Damita Dunnings, MD PGY-1  Attending Neurologist's note:  I personally saw this patient, gathering history, performing a full neurologic examination, reviewing relevant labs, personally reviewing relevant imaging including MRI Cervical spine, and formulated the assessment and plan, adding the note above for completeness and clarity to accurately reflect my thoughts  Lesleigh Noe MD-PhD [facility] [phone] Available 7 AM to 7 PM, outside these hours please contact Neurologist on call listed on AMION

## 2020-12-11 NOTE — Addendum Note (Signed)
Addended by: Hendricks Limes F on: 12/11/2020 10:14 AM   Modules accepted: Orders

## 2020-12-11 NOTE — Procedures (Signed)
Patient Name: Mckenzie [name]  MRN: [id number]  Epilepsy Attending: Lora Havens  Referring Physician/Provider: Dr Lesleigh Noe Date: 12/11/2020 Duration: 26.02 mins  Patient history: 75 year old female with worsening weakness. EEG to evaluate for seizures.  Level of alertness: Awake  AEDs during EEG study: None  Technical aspects: This EEG study was done with scalp electrodes positioned according to the 10-20 International system of electrode placement. Electrical activity was acquired at a sampling rate of 500Hz  and reviewed with a high frequency filter of 70Hz  and a low frequency filter of 1Hz . EEG data were recorded continuously and digitally stored.   Description: The posterior dominant rhythm consists of 8Hz  activity of moderate voltage (25-35 uV) seen predominantly in posterior head regions, symmetric and reactive to eye opening and eye closing. EEG showed intermittent generalized 3 to 5 Hz theta-delta slowing. Hyperventilation did not show any EEG change. Physiologic photic driving was not seen during photic stimulation.      ABNORMALITY -Intermittent slow, generalized  IMPRESSION: This study is suggestive of mild diffuse encephalopathy, nonspecific etiology. No seizures or epileptiform discharges were seen throughout the recording.  Carter Kaman Barbra Sarks

## 2020-12-11 NOTE — Progress Notes (Signed)
PROGRESS NOTE  KELCEY KORUS DGU:[id number] DOB: 1946-05-17 DOA: 12/07/2020 PCP: Loman Brooklyn, FNP  HPI/Recap of past 24 hours: Mckenzie [name] is a 75 y.o. female with medical history significant of hypertension, hyperlipidemia,  chronic anxiety/depression who presents after being advised by her primary care provider to come in for low blood counts and low sodium levels.  Patient's daughter is present at bedside and provides much of the patient's history.  Apparently over the last 3 months patient has been dealing with weakness and confusion.  Last hospitalized on 10/05/2020 after having a fall, status post open reduction internal fixation of a left distal radial fracture.  Since that hospitalization patient daughter noted that she has had a progressive decline and now needs assistance to complete pretty much all of her ADLs.  It seems she was started on Lexapro 2 months prior for adjustment disorder with anxiety and depression.  Daughter notes that the medication has been helping and would like to continue this medication.  She has been followed by Dr. Allyson Sabal of neurology for worsening confusion and weakness.  Patient had been worked up for for her symptoms and had been told that she did not have dementia.  Also reports patient having EMG studies that were negative.  During this time patient sodium and hemoglobin levels have been trending down. No blood in stool, she has been on ferrous sulfate supplements.  She had followed up with [facility] and was scheduled to have a colonoscopy on 12/11/20.   Family is concerned about possible underlying malignancy.  In regards to her sodium levels PCP reportedly was concerned for SIADH and seemed to think Lexapro was less likely to blame.  Her daughter states that she has been eating well, but they have to make sure that she eats.  ED Course: WBC 11.3K, hemoglobin 8.5K, platelets 686K, sodium 125, chloride 92, calcium 8.6, and troponins negative x2.   Urinalysis negative for any signs of infection.    Her hemoglobin down trended to 7.4, iron studies suggestive of iron deficiency, negative FOBT.  She was seen by GI, she had an EGD and colonoscopy done on 12/09/2020 which findings did not explain her anemia.  She was also seen by hematology which was requested by family.  Hematology/medical oncology recommended to optimize hemoglobin at least 9.0.  She was transfused 1 unit PRBC on 12/09/2020, repeated hemoglobin this morning 9.4K from 8.0K.   Hospital course complicated by persistent generalized weakness for which she had a work-up that was initiated in the ED.  UA negative, CT head non acute showing old B/L cerebellar infarcts as well as Atrophy with small vessel chronic ischemic changes of deep cerebral white matter.  2D echo with normal LVEF 65-70%, no regional wall motion abnormalities, grade 1 diastolic dysfunction, negative troponin.  Electrolytes replaced with repeat serum K+ 3.8, magnesium 1.7, repleted.    Inflammatory markers returned significantly elevated CRP 12.3, ESR 112 on 12/09/2020.  Repeated inflammatory markers downtrending, CRP 11.5, sed rate 83 on 12/10/2020.  12/11/20: Patient somnolent.  Husband and daughter at bedside.  She had a CT cervical spine which revealed a 2 x 1 x 1 cm fluid collection at the C7.  She had an EEG that was done which did not show any evidence of seizure.  Neurology is following.  Work-up in progress for generalized weakness.   Assessment/Plan: Principal Problem:   Hyponatremia Active Problems:   Hypertension   Proximal weakness of extremity   Secondary glaucoma  Adjustment disorder with mixed anxiety and depressed mood   Anemia of chronic disease   Thrombocytosis   Hypoalbuminemia   Memory loss   Pressure injury of skin   Benign neoplasm of cecum   Benign neoplasm of descending colon   Hiatal hernia   Hypovolemic hyponatremia: Improving with normal saline IV fluid hydration   Presented with serum  sodium 125 Serum sodium improving, 130 IV fluid normal saline was stopped Sodium tablet was started on 12/11/2020. Continue to monitor serum sodium.  Newly diagnosed fluid collection 2 x 1 x 1 cm at the C7 spine  Rule out inflammatory process versus infective process Appreciate neurology's recommendations  Refractory hypokalemia This can contribute to her generalized weakness Replace orally Serum magnesium 2.0 Repeat BMP in the morning  Iron deficiency anemia, unclear etiology, no evidence of bleeding on EGD or colonoscopy. Hemoglobin dropped to 7.4 with an MCV of 77, FOBT negative. Iron studies suggestive of iron deficiency. Continue ferrous sulfate 325 mg daily. She had an endoscopy and colonoscopy done on 12/09/2020 which did not explain her anemia.  GI signed off. Seen by hematology, recommends to keep hemoglobin greater than 9.0.  Will follow-up outpatient. Hemoglobin 9.4K post 1 unit PRBC transfusion on 12/09/2020, from 8.0K. Hemoglobin 9.3, hemoglobin is stable. Continue to monitor H&H and keep hemoglobin greater than 9.0.  Elevated inflammatory markers, unclear etiology. Does not appear to be infective related with normal lactic acid level and normal procalcitonin, afebrile with no leukocytosis. Inflammatory markers returned significantly elevated CRP 12.3, ESR 112 on 12/09/2020.  Repeated inflammatory markers downtrending, CRP 11.5, sed rate 83 on 12/10/2020. Neurology has been consulted on 12/10/2020. Will likely need to follow-up with rheumatology outpatient. Ongoing work-up.  Generalized weakness, suspect multifactorial secondary to hypokalemia, hypomagnesemia, anemia, and possibly inflammatory disorder. Spoke with the patient's daughter who is concerned about her generalized weakness.   UA negative, CT head non acute showing old B/L cerebellar infarcts as well as Atrophy with small vessel chronic ischemic changes of deep cerebral white matter.  2D echo with normal LVEF 65-70%, no  regional wall motion abnormalities, negative troponin.   Elevated inflammatory markers, rule out inflammatory disorder. CPK was not elevated less than 5. Optimize potassium and magnesium level.  History of prior bilateral cerebellar CVA  CT head done on 12/08/2020 revealed old bilateral cerebral infarcts. Patient and daughter were not aware of prior stroke. Neurology consulted. Continue PT OT with assistance and fall precautions Started daily aspirin 81 mg LDL 82, goal less than 70  Hyperlipidemia LDL 82 Goal less than 70 Patient has not tolerated statin in the past.  Essential hypertension Blood pressure is improved on Norvasc 5 mg daily. Continue to monitor vital signs.  Memory loss possibly secondary to her depression. Family reports patient has had intermittent confusion and memory loss.   She has been worked up by Dr. Allyson Sabal as well for this and negative for dementia. -Continue outpatient follow-up with Dr. Allyson Sabal  Severe protein calorie malnutrition BMI 17 Albumin 1.6 on 12/09/2020. Encourage increase oral protein calorie intake. Dietary consult.  Resolved acute leukocytosis likely reactive Currently not on antibiotics  Leukocytosis has resolved.   No evidence of active infective process.   Urine analysis and chest x-ray negative.   Procalcitonin and lactic acid negative.  Cardiomegaly: Acute.   Chest x-ray noted cardiomegaly without signs of edema.  BNP was noted to be 213.71 on 2/25. 2D echo done on 12/09/2020 showed LVEF 65 to 70% with grade 1 diastolic dysfunction. Continue  strict I's and O's and daily weight. Net I&O +1.9 L  Proximal extremity weakness:  Patient has been being worked up in the outpatient setting by Dr. Allyson Sabal of neurology and appears to have extensive work-up done at the end of last year thyroid studies were noted to be within normal limits.  Recently had normal EMG studies. -PT/assessment recommended home health PT. TOC consulted to assist  with home health services arrangement.  Thrombocytosis: Acute.  Likely reactive.  Chronic anxiety/depression:  Stable at the time of this exam. Continue home Lexapro.  Secondary glaucoma -Continue regimen of eyedrops  Physical debility Continue PT OT with assistance Continue fall precautions.     Code Status: Full code.  Family Communication: Updated her daughter and husband at bedside.  Disposition Plan: Likely will discharge to home on 12/13/2020.   Consultants:  GI  Hematology/medical oncology.  Neurology on 12/10/2020.  Procedures:  EGD  Colonoscopy.  Antimicrobials:  None.  DVT prophylaxis: SCDs.  Status is: Inpatient    Dispo:  Patient From: Home  Planned Disposition: Home with Health Care Svc  Anticipated discharge date 12/13/2020 or when neurology signs off.  Medically stable for discharge: No, ongoing management of generalized weakness with ongoing work-up.          Objective: Vitals:   12/10/20 1559 12/10/20 2037 12/11/20 0942 12/11/20 1645  BP: (!) 164/65 (!) 157/61 (!) 155/74 (!) 148/52  Pulse: 82 88  85  Resp: _0 Temp: 98.6 F (37 C) 98.3 F (36.8 C)  99.3 F (37.4 C)  TempSrc:  Oral    SpO2: 96% 96%  96%    Intake/Output Summary (Last 24 hours) at 12/11/2020 1710 Last data filed at 12/11/2020 1500 Gross per 24 hour  Intake 363 ml  Output 700 ml  Net -337 ml   There were no vitals filed for this visit.  Exam:  . General: 74 y.o. year-old female frail, weak appearing in no acute distress.  Somnolent.   . Cardiovascular: Regular rate and rhythm no rubs or gallops. Marland Kitchen Respiratory: Clear to auscultation no wheeze or rales. . Abdomen: Soft nontender normal bowel sounds present.   . Musculoskeletal: No lower extremity edema bilaterally.   . Skin: No ulcerative lesions noted.   Marland Kitchen Psychiatry: Mood is appropriate for condition and setting.   Data Reviewed: CBC: Recent Labs  Lab 12/06/20 1341 12/07/20 1633  12/07/20 1633 12/09/20 0203 12/09/20 0754 12/09/20 1652 12/10/20 0115 12/11/20 0259  WBC 11.0* 11.3*  --  9.0  --   --  8.3 10.4  NEUTROABS 8.9*  --   --   --   --   --  6.4  --   HGB 8.7* 8.5*   < > 7.4* 8.4* 8.0* 9.4* 9.3*  HCT 28.4* 29.0*   < > 24.6* 26.7* 26.8* 28.9* 28.7*  MCV 79 79.5*  --  77.4*  --   --  77.7* 78.4*  PLT 666* 686*  --  550*  --   --  530* 580*   < > = values in this interval not displayed.   Basic Metabolic Panel: Recent Labs  Lab 12/08/20 0228 12/08/20 1112 12/09/20 0203 12/09/20 1652 12/10/20 0115 12/11/20 0243 12/11/20 1411  NA  --    < > 127* 129* 131* 127* 130*  K  --    < > 3.1* 3.8 3.4* 3.3* 3.1*  CL  --    < > 96* 98 96* 94* 96*  CO2  --    < >  _0 GLUCOSE  --    < > 122* 100* 120* 115* 153*  BUN  --    < > <5* <5* 6* 9 7*  CREATININE  --    < > <0.30* 0.32* 0.34* 0.37* 0.33*  CALCIUM  --    < > 8.2* 8.4* 8.4* 8.1* 7.9*  MG 1.7  --   --   --  1.7 1.6* 2.0  PHOS  --   --   --   --  4.0  --   --    < > = values in this interval not displayed.   GFR: Estimated Creatinine Clearance: 45.7 mL/min (A) (by C-G formula based on SCr of 0.33 mg/dL (L)). Liver Function Tests: Recent Labs  Lab 12/06/20 1341 12/08/20 1112 12/09/20 0203 12/10/20 0115  AST 42* _1 ALT 60* 50* 46* 33  ALKPHOS 163* 115 121 108  BILITOT <0.2 0.4 0.5 1.2  PROT 7.2 6.1* 5.9* 5.9*  ALBUMIN 2.7* 1.6* 1.6* 1.6*   No results for input(s): LIPASE, AMYLASE in the last 168 hours. No results for input(s): AMMONIA in the last 168 hours. Coagulation Profile: No results for input(s): INR, PROTIME in the last 168 hours. Cardiac Enzymes: Recent Labs  Lab 12/10/20 0543  CKTOTAL <5*   BNP (last 3 results) No results for input(s): PROBNP in the last 8760 hours. HbA1C: No results for input(s): HGBA1C in the last 72 hours. CBG: No results for input(s): GLUCAP in the last 168 hours. Lipid Profile: Recent Labs    12/11/20 0243  CHOL 127  HDL 27*   LDLCALC 82  TRIG 88  CHOLHDL 4.7   Thyroid Function Tests: No results for input(s): TSH, T4TOTAL, FREET4, T3FREE, THYROIDAB in the last 72 hours. Anemia Panel: No results for input(s): VITAMINB12, FOLATE, FERRITIN, TIBC, IRON, RETICCTPCT in the last 72 hours. Urine analysis:    Component Value Date/Time   COLORURINE YELLOW 12/08/2020 0129   APPEARANCEUR HAZY (A) 12/08/2020 0129   APPEARANCEUR Clear 11/21/2020 1644   LABSPEC 1.019 12/08/2020 0129   PHURINE 6.0 12/08/2020 0129   GLUCOSEU NEGATIVE 12/08/2020 0129   HGBUR NEGATIVE 12/08/2020 0129   BILIRUBINUR NEGATIVE 12/08/2020 0129   BILIRUBINUR Negative 11/21/2020 1644   KETONESUR NEGATIVE 12/08/2020 0129   PROTEINUR NEGATIVE 12/08/2020 0129   UROBILINOGEN negative 05/13/2015 0827   NITRITE NEGATIVE 12/08/2020 0129   LEUKOCYTESUR NEGATIVE 12/08/2020 0129   Sepsis Labs: _2 (procalcitonin:4,lacticidven:4)  ) Recent Results (from the past 240 hour(s))  Resp Panel by RT-PCR (Flu A&B, Covid) Nasopharyngeal Swab     Status: None   Collection Time: 12/08/20  2:34 AM   Specimen: Nasopharyngeal Swab; Nasopharyngeal(NP) swabs in vial transport medium  Result Value Ref Range Status   SARS Coronavirus 2 by RT PCR NEGATIVE NEGATIVE Final    Comment: (NOTE) SARS-CoV-2 target nucleic acids are NOT DETECTED.  The SARS-CoV-2 RNA is generally detectable in upper respiratory specimens during the acute phase of infection. The lowest concentration of SARS-CoV-2 viral copies this assay can detect is 138 copies/mL. A negative result does not preclude SARS-Cov-2 infection and should not be used as the sole basis for treatment or other patient management decisions. A negative result may occur with  improper specimen collection/handling, submission of specimen other than nasopharyngeal swab, presence of viral mutation(s) within the areas targeted by this assay, and inadequate number of viral copies(<138 copies/mL). A negative result  must be combined with clinical observations, patient history, and epidemiological information.  The expected result is Negative.  Fact Sheet for Patients:  EntrepreneurPulse.com.au  Fact Sheet for Healthcare Providers:  IncredibleEmployment.be  This test is no t yet approved or cleared by the [place] FDA and  has been authorized for detection and/or diagnosis of SARS-CoV-2 by FDA under an Emergency Use Authorization (EUA). This EUA will remain  in effect (meaning this test can be used) for the duration of the COVID-19 declaration under Section 564(b)(1) of the Act, 21 U.S.C.section 360bbb-3(b)(1), unless the authorization is terminated  or revoked sooner.       Influenza A by PCR NEGATIVE NEGATIVE Final   Influenza B by PCR NEGATIVE NEGATIVE Final    Comment: (NOTE) The Xpert Xpress SARS-CoV-2/FLU/RSV plus assay is intended as an aid in the diagnosis of influenza from Nasopharyngeal swab specimens and should not be used as a sole basis for treatment. Nasal washings and aspirates are unacceptable for Xpert Xpress SARS-CoV-2/FLU/RSV testing.  Fact Sheet for Patients: EntrepreneurPulse.com.au  Fact Sheet for Healthcare Providers: IncredibleEmployment.be  This test is not yet approved or cleared by the [place] FDA and has been authorized for detection and/or diagnosis of SARS-CoV-2 by FDA under an Emergency Use Authorization (EUA). This EUA will remain in effect (meaning this test can be used) for the duration of the COVID-19 declaration under Section 564(b)(1) of the Act, 21 U.S.C. section 360bbb-3(b)(1), unless the authorization is terminated or revoked.  Performed at [facility], [place] [address], [place], [place] [address]       Studies: MR CERVICAL SPINE W WO CONTRAST  Result Date: 12/11/2020 CLINICAL DATA:  Initial evaluation for myelopathy, acute or progressive. EXAM: MRI  CERVICAL SPINE WITHOUT AND WITH CONTRAST TECHNIQUE: Multiplanar and multiecho pulse sequences of the cervical spine, to include the craniocervical junction and cervicothoracic junction, were obtained without and with intravenous contrast. CONTRAST:  45m GADAVIST GADOBUTROL 1 MMOL/ML IV SOLN COMPARISON:  Prior CT from 10/05/2020. FINDINGS: Alignment: Examination moderately degraded by motion artifact. Straightening of the normal cervical lordosis. No listhesis or static subluxation. Vertebrae: Vertebral body height maintained without acute or chronic fracture. Bone marrow signal intensity diffusely heterogeneous but within normal limits. No worrisome osseous lesions. No abnormal marrow edema or enhancement. Cord: Signal intensity within the cervical spinal cord is within normal limits. Normal cord caliber morphology. No abnormal enhancement. Posterior Fossa, vertebral arteries, paraspinal tissues: Chronic microvascular ischemic disease noted within the visualized pons and brainstem. Chronic bilateral cerebellar infarcts partially visualized. Craniocervical junction normal. There is a small curvilinear fluid collection measuring approximately 2.0 x 1.0 x 1.0 cm along the dorsal tip and left aspect of the C7 spinous process (series 8, image 32). Surrounding edema and enhancement evident on postcontrast sequence (series 11, image 8). Similar change also seen at the dorsal tip of the T1 spinous process. Enhancement extends to involve the adjacent interspinous regions. Paraspinous and prevertebral soft tissues otherwise within normal limits. Abnormal flow void within the left vertebral artery, which could be related to slow flow and/or occlusion, although a small central flow void appears to be preserved (series 8, image 21). Diminutive but patent flow void within the right vertebral artery. Disc levels: C2-C3: Minimal disc bulge.  No canal or foraminal stenosis. C3-C4: Minimal disc bulge with mild facet hypertrophy. No  canal or foraminal stenosis. C4-C5: Mild disc bulge with uncovertebral hypertrophy. Bilateral facet degeneration. No significant canal or foraminal stenosis. C5-C6: Mild degenerative intervertebral disc space narrowing with diffuse disc osteophyte complex. Flattening and partial effacement of the  ventral thecal sac with resultant mild spinal stenosis. Moderate right with mild left C6 foraminal narrowing. C6-C7: Mild disc bulge with bilateral facet hypertrophy. No canal or foraminal stenosis. C7-T1:  Negative interspace.  Mild facet hypertrophy.  No stenosis. IMPRESSION: 1. Enhancement and edema about the dorsal tips of the C7 and T1 spinous processes, with a superimposed 2 cm collection adjacent to the C7 spinous process. Findings are nonspecific, but can be seen in the setting of underlying inflammatory conditions including polymyalgia rheumatica. In this setting, the collection could reflect a small bursal collection. Possible infection would be the primary differential consideration, and could be considered in the correct clinical setting. 2. Abnormal flow void within the left vertebral artery, which could be related to slow flow and/or occlusion. 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 3. Mild multilevel cervical spondylosis, most pronounced at C5-6 were there is resultant mild spinal stenosis, with moderate right C6 foraminal narrowing. 4. Normal MRI appearance of the cervical spinal cord. No cord signal changes to suggest myelopathy. Electronically Signed   By: Jeannine Boga M.D.   On: 12/11/2020 05:05   EEG adult  Result Date: 12/11/2020 Lora Havens, MD     12/11/2020  1:24 PM Patient Name: Mckenzie [name] MRN: [id number] Epilepsy Attending: Lora Havens Referring Physician/Provider: Dr Lesleigh Noe Date: 12/11/2020 Duration: 26.02 mins Patient history: 75 year old female with worsening weakness. EEG to evaluate for seizures. Level of alertness: Awake AEDs  during EEG study: None Technical aspects: This EEG study was done with scalp electrodes positioned according to the 10-20 International system of electrode placement. Electrical activity was acquired at a sampling rate of _0  and reviewed with a high frequency filter of _1  and a low frequency filter of _2 . EEG data were recorded continuously and digitally stored. Description: The posterior dominant rhythm consists of _3  activity of moderate voltage (25-35 uV) seen predominantly in posterior head regions, symmetric and reactive to eye opening and eye closing. EEG showed intermittent generalized 3 to 5 Hz theta-delta slowing. Hyperventilation did not show any EEG change. Physiologic photic driving was not seen during photic stimulation.    ABNORMALITY -Intermittent slow, generalized IMPRESSION: This study is suggestive of mild diffuse encephalopathy, nonspecific etiology. No seizures or epileptiform discharges were seen throughout the recording. Priyanka Barbra Sarks    Scheduled Meds: . amLODipine  2.5 mg Oral Daily  . aspirin EC  81 mg Oral Daily  . timolol  1 drop Both Eyes BID   And  . brimonidine  1 drop Both Eyes BID  . dorzolamide  1 drop Both Eyes BID  . escitalopram  20 mg Oral Daily  . feeding supplement  237 mL Oral BID BM  . ferrous sulfate  325 mg Oral Q breakfast  . fluorometholone  1 drop Right Eye BID  . ketorolac  1 drop Right Eye Daily  . latanoprost  1 drop Both Eyes QHS  . linaclotide  145 mcg Oral QAC breakfast  . magnesium oxide  800 mg Oral Daily  . multivitamin with minerals  1 tablet Oral Daily  . Netarsudil Dimesylate  1 drop Both Eyes BID  . potassium chloride  40 mEq Oral BID  . sodium chloride flush  3 mL Intravenous Q12H  . sodium chloride  1 g Oral BID WC    Continuous Infusions:    LOS: 3 days     Kayleen Memos, MD [facility] Pager [phone]  If 7PM-7AM, please contact night-coverage [email or web address] Password [facility] 12/11/2020,  5:10 PM

## 2020-12-11 NOTE — Progress Notes (Signed)
0310 Ativan 0.5mg  IVP given at this time prior to be taken down for MRI.  0425 Pt returned to floor from MRI.

## 2020-12-11 NOTE — Progress Notes (Signed)
Initial Nutrition Assessment  DOCUMENTATION CODES:   Non-severe (moderate) malnutrition in context of chronic illness,Underweight  INTERVENTION:   -Ensure Enlive po BID, each supplement provides 350 kcal and 20 grams of protein -MVI with minerals daily  NUTRITION DIAGNOSIS:   Moderate Malnutrition related to chronic illness (HTN, hyperlipidemia) as evidenced by energy intake < or equal to 75% for > or equal to 1 month,mild fat depletion,moderate fat depletion,mild muscle depletion,moderate muscle depletion.  GOAL:   Patient will meet greater than or equal to 90% of their needs  MONITOR:   PO intake,Supplement acceptance,Labs,Weight trends,Skin,I & O's  REASON FOR ASSESSMENT:   Consult Assessment of nutrition requirement/status  ASSESSMENT:   Mckenzie [name] is a 75 y.o. female with medical history significant of hypertension, hyperlipidemia, anxiety, and depression presents after being advised by her primary care provider to come in for low blood counts and low sodium levels.  Pt admitted with acute on chronic hyponatremia.   3/5- s/p colonoscopy- revealed polyps in descending colon and cecum (resected), diverticulosis in sigmoid colon; s/p upper GI endoscopy- revealed large hiatal hernia, polyps found in fundus and gastric body  Reviewed I/O's: -697 ml x 24 hours and +1.6 L since admission  UOP: 700 ml x 24 hours  Pt very lethargic at time of visit and deferred most history from her daughter. Per daughter, pt has experienced a general decline in health over the past year. Pt has been less active and weak. She is a very selective eater and consumes mostly protein and regular consumes favorite foods (Breakfast: boiled egg; Lunch: small piece of salmon; Dinner: chicken). Pt also consumes a meal replacement shake, orange juice, and V8 juice between meals.   Pt consumed about 50% of meals yesterday, however, has not woken up to eat breakfast yet.   Reviewed wt hx; wt has  been stable over the past year. Pt daughter shares that pt has a distant history of weight loss, which was mostly intentional secondary to lifestyle modifications prior to health decline (pt started consuming healthier diet and walking routinely). Per daughter, pt is more weak and a lap around the house tires her greatly. She also has noticed muscle atrophy in arms and legs.   Discussed importance of good meal and supplement intake to promote healing. She is amenable to MVI and Ensure supplements. Pt has been able to find foods she enjoys on hospital menu.   Labs reviewed: Na: 127 (on IV supplementation), K: 3.3. Mg: 1.6 (on PO supplementation). Phos WDL.  NUTRITION - FOCUSED PHYSICAL EXAM:  Flowsheet Row Most Recent Value  Orbital Region No depletion  Upper Arm Region Moderate depletion  Thoracic and Lumbar Region No depletion  Buccal Region Mild depletion  Temple Region Mild depletion  Clavicle Bone Region Mild depletion  Clavicle and Acromion Bone Region No depletion  Scapular Bone Region No depletion  Dorsal Hand Mild depletion  Patellar Region Moderate depletion  Anterior Thigh Region Moderate depletion  Posterior Calf Region Moderate depletion  Edema (RD Assessment) None  Hair Reviewed  Eyes Reviewed  Mouth Reviewed  Skin Reviewed  Nails Reviewed       Diet Order:   Diet Order            Diet regular Room service appropriate? Yes; Fluid consistency: Thin  Diet effective now                 EDUCATION NEEDS:   Education needs have been addressed  Skin:  Skin Assessment: Skin Integrity  Issues: Skin Integrity Issues:: Stage I Stage I: coccyx  Last BM:  12/10/20  Height:   Ht Readings from Last 1 Encounters:  12/06/20 5\' 5"  (1.651 m)    Weight:   Wt Readings from Last 1 Encounters:  12/06/20 47.6 kg    Ideal Body Weight:  56.8 kg  BMI:  There is no height or weight on file to calculate BMI.  Estimated Nutritional Needs:   Kcal:   1700-1900  Protein:  95-110 grams  Fluid:  > 1.7 L    Loistine Chance, RD, LDN, [place] Registered Dietitian II Certified Diabetes Care and Education Specialist Please refer to [facility] for RD and/or RD on-call/weekend/after hours pager

## 2020-12-11 NOTE — Progress Notes (Signed)
EEG complete - results pending 

## 2020-12-12 ENCOUNTER — Ambulatory Visit: Payer: Medicare Other | Admitting: Cardiology

## 2020-12-12 ENCOUNTER — Inpatient Hospital Stay (HOSPITAL_COMMUNITY): Payer: Medicare Other

## 2020-12-12 DIAGNOSIS — R531 Weakness: Secondary | ICD-10-CM | POA: Diagnosis not present

## 2020-12-12 DIAGNOSIS — I313 Pericardial effusion (noninflammatory): Secondary | ICD-10-CM | POA: Diagnosis not present

## 2020-12-12 DIAGNOSIS — E871 Hypo-osmolality and hyponatremia: Secondary | ICD-10-CM | POA: Diagnosis not present

## 2020-12-12 LAB — BASIC METABOLIC PANEL
Anion gap: 7 (ref 5–15)
BUN: 10 mg/dL (ref 8–23)
CO2: 25 mmol/L (ref 22–32)
Calcium: 8 mg/dL — ABNORMAL LOW (ref 8.9–10.3)
Chloride: 94 mmol/L — ABNORMAL LOW (ref 98–111)
Creatinine, Ser: 0.35 mg/dL — ABNORMAL LOW (ref 0.44–1.00)
GFR, Estimated: >60 mL/min (ref >60)
Glucose, Bld: 115 mg/dL — ABNORMAL HIGH (ref 70–99)
Potassium: 4.2 mmol/L (ref 3.5–5.1)
Sodium: 126 mmol/L — ABNORMAL LOW (ref 135–145)

## 2020-12-12 LAB — SURGICAL PATHOLOGY

## 2020-12-12 LAB — SODIUM: Sodium: 127 mmol/L — ABNORMAL LOW (ref 135–145)

## 2020-12-12 LAB — ECHOCARDIOGRAM LIMITED: S' Lateral: 2.8 cm

## 2020-12-12 LAB — PROTEIN ELECTROPHORESIS, SERUM
A/G Ratio: 0.4 — ABNORMAL LOW (ref 0.7–1.7)
Albumin ELP: 1.8 g/dL — ABNORMAL LOW (ref 2.9–4.4)
Alpha-1-Globulin: 0.5 g/dL — ABNORMAL HIGH (ref 0.0–0.4)
Alpha-2-Globulin: 1.1 g/dL — ABNORMAL HIGH (ref 0.4–1.0)
Beta Globulin: 0.9 g/dL (ref 0.7–1.3)
Gamma Globulin: 1.6 g/dL (ref 0.4–1.8)
Globulin, Total: 4.1 g/dL — ABNORMAL HIGH (ref 2.2–3.9)
Total Protein ELP: 5.9 g/dL — ABNORMAL LOW (ref 6.0–8.5)

## 2020-12-12 LAB — CBC
HCT: 26.5 % — ABNORMAL LOW (ref 36.0–46.0)
Hemoglobin: 8.6 g/dL — ABNORMAL LOW (ref 12.0–15.0)
MCH: 25.2 pg — ABNORMAL LOW (ref 26.0–34.0)
MCHC: 32.5 g/dL (ref 30.0–36.0)
MCV: 77.7 fL — ABNORMAL LOW (ref 80.0–100.0)
Platelets: 554 10*3/uL — ABNORMAL HIGH (ref 150–400)
RBC: 3.41 MIL/uL — ABNORMAL LOW (ref 3.87–5.11)
RDW: 15.9 % — ABNORMAL HIGH (ref 11.5–15.5)
WBC: 10.7 10*3/uL — ABNORMAL HIGH (ref 4.0–10.5)
nRBC: 0 % (ref 0.0–0.2)

## 2020-12-12 LAB — HEMOGLOBIN AND HEMATOCRIT, BLOOD
HCT: 28.1 % — ABNORMAL LOW (ref 36.0–46.0)
Hemoglobin: 9 g/dL — ABNORMAL LOW (ref 12.0–15.0)

## 2020-12-12 LAB — MAGNESIUM: Magnesium: 1.8 mg/dL (ref 1.7–2.4)

## 2020-12-12 MED ORDER — SODIUM CHLORIDE 1 G PO TABS
1.0000 g | ORAL_TABLET | Freq: Three times a day (TID) | ORAL | Status: AC
  Administered 2020-12-12 – 2020-12-16 (×13): 1 g via ORAL
  Filled 2020-12-12 (×16): qty 1

## 2020-12-12 MED ORDER — POTASSIUM CHLORIDE CRYS ER 20 MEQ PO TBCR
20.0000 meq | EXTENDED_RELEASE_TABLET | Freq: Every day | ORAL | Status: DC
  Administered 2020-12-12 – 2020-12-13 (×2): 20 meq via ORAL
  Filled 2020-12-12 (×2): qty 1

## 2020-12-12 NOTE — Progress Notes (Addendum)
Neurology Progress Note Reason for Consult:weakness, gait disturbance Referring Physician:C. 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 Husband is at bedside today and reports that Dr. Felecia Shelling had a conversation with patient and her husband about possible PMR diagnosis last December. On exam today patient reports improved appetite, but continues to feel weak moreso in her lower extremities. Patient is very clear that she has not experienced jaw claudication, headache of vision loss recently.  Exam: Vitals:   12/12/20 0936 12/12/20 1423  BP: (!) 166/66 (!) 123/43  Pulse: 83 81  Resp: 15 16  Temp: 98 F (36.7 C)   SpO2: 95% 100%   Gen: In bed, NAD Resp: non-labored breathing, no acute distress Abd: soft, nt Skin: Warm and dry, no rash noted  MS: BUE flexors 4+/5, BUE extensors 4+/5 BLE flexors 3-/5, Bilateral dosiflexion 4/5 , Bilateral plantar flexion 4/5 Mental Status: AA&Ox3  Speech/Language: speech is without dysarthria or aphasia. Patient able to hold a conversation and answer questions appropriately.   Cranial Nerves:  II: PERRL. visual fields full.  III, IV, VI: EOMI. Lid elevation symmetric and full.  V: sensation is intact and symmetrical to face. Moves jaw back and forth.  VII: Smile is symmetrical. Able to raise eyebrows.  VIII:hearing intact to voice IX, X: palate elevation is symmetric. Phonation normal.  XI: normal sternocleidomastoid and trapezius muscle strength WUX:LKGMWN is symmetrical without fasciculations.  Sensory: Intact to light touch bilaterally DTR:3+ throughout  Pertinent Labs: CBC Latest Ref Rng & Units 12/12/2020 12/12/2020 12/11/2020  WBC 4.0 - 10.5 K/uL - 10.7(H) 10.4  Hemoglobin 12.0 - 15.0 g/dL 9.0(L) 8.6(L) 9.3(L)  Hematocrit 36.0 - 46.0 % 28.1(L) 26.5(L) 28.7(L)  Platelets 150  - 400 K/uL - 554(H) 580(H)   CMP Latest Ref Rng & Units 12/12/2020 12/12/2020 12/11/2020  Glucose 70 - 99 mg/dL - 115(H) 153(H)  BUN 8 - 23 mg/dL - 10 7(L)  Creatinine 0.44 - 1.00 mg/dL - 0.35(L) 0.33(L)  Sodium 135 - 145 mmol/L 127(L) 126(L) 130(L)  Potassium 3.5 - 5.1 mmol/L - 4.2 3.1(L)  Chloride 98 - 111 mmol/L - 94(L) 96(L)  CO2 22 - 32 mmol/L - 25 25  Calcium 8.9 - 10.3 mg/dL - 8.0(L) 7.9(L)  Total Protein 6.5 - 8.1 g/dL - - -  Total Bilirubin 0.3 - 1.2 mg/dL - - -  Alkaline Phos 38 - 126 U/L - - -  AST 15 - 41 U/L - - -  ALT 0 - 44 U/L - - -    CT ANGIO CHEST AORTA W/CM &/OR WO/CM  Result Date: 12/11/2020 CLINICAL DATA:  History gyn cell arteritis EXAM: CT ANGIOGRAPHY CHEST WITH CONTRAST TECHNIQUE: Multidetector CT imaging of the chest was performed using the standard protocol during bolus administration of intravenous contrast. Multiplanar CT image reconstructions and MIPs were obtained to evaluate the vascular anatomy. CONTRAST:  162mL OMNIPAQUE IOHEXOL 350 MG/ML SOLN COMPARISON:  12/08/2020, 11/30/2020 CT of the abdomen FINDINGS: Cardiovascular: Initial precontrast images demonstrate atherosclerotic calcifications without aneurysmal dilatation. No hyperdense crescent to suggest acute aortic injury is seen. Post-contrast images demonstrate no aneurysmal dilatation or dissection of the thoracic aorta. Mild wall thickening is noted diffusely throughout the descending thoracic aorta. Heart is enlarged in size. The pulmonary artery shows a normal branching pattern without evidence of intraluminal filling defect to suggest pulmonary embolism. No significant coronary calcifications are noted. Pericardial effusion  is noted slightly larger than that seen on prior exam. Mediastinum/Nodes: Thoracic inlet is within normal limits. No sizable hilar or mediastinal adenopathy is noted. The esophagus is within normal limits. Lungs/Pleura: Diffuse emphysematous changes of lungs are noted. Large left-sided  pleural effusion is noted which is increased from the prior CT of the chest. Left lower lobe consolidation is noted new from the prior exam. Small right-sided pleural effusion with dependent atelectasis is seen. No sizable parenchymal nodules are noted. Upper Abdomen: Visualized upper abdomen shows no acute abnormality. Musculoskeletal: Degenerative changes of the thoracic spine are noted. No acute bony abnormality is seen. Review of the MIP images confirms the above findings. IMPRESSION: No evidence of pulmonary emboli. Enlarging pericardial effusion. Wall thickening is noted in the descending thoracic aorta. This would correspond with the given clinical history although nonspecific in nature. Enlarging left-sided pleural effusion with underlying left lower lobe consolidation. Aortic Atherosclerosis (ICD10-I70.0) and Emphysema (ICD10-J43.9). Electronically Signed   By: Inez Catalina M.D.   On: 12/11/2020 18:34   ECHOCARDIOGRAM LIMITED  Result Date: 12/12/2020    ECHOCARDIOGRAM LIMITED REPORT   Patient Name:   Mckenzie [name] Date of Exam: 12/12/2020 Medical Rec #:  [id number]         Height:       65.0 in Accession #:    [id number]        Weight:       105.0 lb Date of Birth:  11-24-1945          BSA:          1.504 m Patient Age:    60 years          BP:           166/64 mmHg Patient Gender: F                 HR:           83 bpm. Exam Location:  Inpatient Procedure: Limited Echo Indications:    Pericardial effusion  History:        Patient has prior history of Echocardiogram examinations, most                 recent 12/08/2020. Pericardial Disease, Stroke; Risk                 Factors:Hypertension and Dyslipidemia. Pericardial effusion,                 pleural effusion.  Sonographer:    Dustin Flock Referring Phys: [id number] Kayleen Memos  Sonographer Comments: Follow up pericardial effusion from 12/08/20 IMPRESSIONS  1. Left ventricular ejection fraction, by estimation, is 55 to 60%. The left ventricle has normal  function.  2. Right ventricular systolic function is normal. The right ventricular size is normal.  3. There is a small - moderate sized pericardial effusion. The IVC is normal sized and collapses with "sniff". There is slight invagination of the RA. The respiratory variability of the MV and TV inflows were not evaluated on this echo.         . Moderate pericardial effusion.  4. The aortic valve is normal in structure. Aortic valve regurgitation is not visualized. No aortic stenosis is present. FINDINGS  Left Ventricle: Left ventricular ejection fraction, by estimation, is 55 to 60%. The left ventricle has normal function. Right Ventricle: The right ventricular size is normal. No increase in right ventricular wall thickness. Right ventricular systolic function is normal. Pericardium: There is a small -  moderate sized pericardial effusion. The IVC is normal sized and collapses with "sniff". There is slight invagination of the RA. The respiratory variability of the MV and TV inflows were not evaluated on this echo. A moderately sized pericardial effusion is present. Aortic Valve: The aortic valve is normal in structure. Aortic valve regurgitation is not visualized. No aortic stenosis is present. 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 On exam today the patient continues to have proximal muscle weakness most obvious in the lower extremities bilaterally. CT A chest noted thickening of the thoracic aorta which is non-specific but can be associated with GCA; however patient adamantly denies hallmark symptoms of CGA. Patient ANA negative. Patient's opthamologist Dr. Katy Fitch spoke  with clinical team yesterday and reported that patient can be started on steroids and will follow-up with him in 1 week for close assessment of increased IOP. CT A Chest also noted that patient's pleural effusion is growing and patient also has a pericardial effusion. At this time will focus on care of patient's effusion's and etiology before starting on steroids for rheumatological disease. Oncology is following patient, but at this time have low suspicion for malignancy. Cardiology has been consulted for repeat echo and further investigation of patient's effusions. Due to the enlarging pleural effusion will hold off on sample of the fluid collection as patient's pleural effusion may make it difficult for patient to lie for the procedure.   Recommendations: - f/u Cardiology recommendations concerning pleural and pericardiac effusions - F/u Protein Electrophroresis - Neurology will continue to follow  Damita Dunnings, MD PGY-1  Attending Neurologist's note:  Highly elevated inflammatory markers in this patient are concerning but nonspecific.  While certainly her joint pain and pattern of muscle weakness fit a diagnosis of PMR, patient and family deny any cardinal symptoms of GCA (jaw claudication, temporal tenderness, headaches, amaurosis fugax).  Additionally pericardial and pleural effusions associated with PMR are case-reportable.  Before proceeding with potential steroid treatment for an autoimmune inflammatory condition, effusions should be thoroughly investigated.  I personally saw this patient, gathering history, performing a full neurologic examination, reviewing relevant labs, personally reviewing relevant imaging including MRI C-spine, CTA chest, and formulated the assessment and plan, adding the note above for completeness and clarity to accurately reflect my thoughts  Lesleigh Noe MD-PhD [facility] [phone] Available 7 AM to 7 PM, outside these hours please contact  Neurologist on call listed on AMION

## 2020-12-12 NOTE — Progress Notes (Signed)
PROGRESS NOTE  Mckenzie [name] IHK:[id number] DOB: November 09, 1945 DOA: 12/07/2020 PCP: Loman Brooklyn, FNP  HPI/Recap of past 24 hours: Mckenzie [name] is a 75 y.o. female with medical history significant of hypertension, hyperlipidemia,  chronic anxiety/depression who presents after being advised by her primary care provider to come in for low blood counts and low sodium levels.  Patient's daughter is present at bedside and provides much of the patient's history.  Apparently over the last 3 months patient has been dealing with weakness and confusion.  Last hospitalized on 10/05/2020 after having a fall, status post open reduction internal fixation of a left distal radial fracture.  Since that hospitalization patient daughter noted that she has had a progressive decline and now needs assistance to complete pretty much all of her ADLs.  It seems she was started on Lexapro 2 months prior for adjustment disorder with anxiety and depression.  Daughter notes that the medication has been helping and would like to continue this medication.  She has been followed by Dr. Allyson Sabal of neurology for worsening confusion and weakness.  Patient had been worked up for for her symptoms and had been told that she did not have dementia.  Also reports patient having EMG studies that were negative.  During this time patient sodium and hemoglobin levels have been trending down. No blood in stool, she has been on ferrous sulfate supplements.  She had followed up with [facility] and was scheduled to have a colonoscopy on 12/11/20.   Family is concerned about possible underlying malignancy.  In regards to her sodium levels PCP reportedly was concerned for SIADH and seemed to think Lexapro was less likely to blame.  Her daughter states that she has been eating well, but they have to make sure that she eats.  ED Course: WBC 11.3K, hemoglobin 8.5K, platelets 686K, sodium 125, chloride 92, calcium 8.6, and troponins negative x2.   Urinalysis negative for any signs of infection.    Her hemoglobin down trended to 7.4, iron studies suggestive of iron deficiency, negative FOBT.  She was seen by GI, she had an EGD and colonoscopy done on 12/09/2020 which findings did not explain her anemia.  She was also seen by hematology which was requested by family.  Hematology/medical oncology recommended to optimize hemoglobin at least 9.0.  She was transfused 1 unit PRBC on 12/09/2020, repeated hemoglobin this morning 9.4K from 8.0K.   Hospital course complicated by persistent generalized weakness for which she had a work-up that was initiated in the ED.  UA negative, CT head non acute showing old B/L cerebellar infarcts as well as Atrophy with small vessel chronic ischemic changes of deep cerebral white matter.  2D echo with normal LVEF 65-70%, no regional wall motion abnormalities, grade 1 diastolic dysfunction, negative troponin.  Electrolytes replaced with repeat serum K+ 3.8, magnesium 1.7, repleted.    Inflammatory markers returned significantly elevated CRP 12.3, ESR 112 on 12/09/2020.  Repeated inflammatory markers downtrending, CRP 11.5, sed rate 83 on 12/10/2020.  She had a CT cervical spine which revealed a 2 x 1 x 1 cm fluid collection at the C7.  She had an EEG on 12/11/20, no evidence of seizure.   CTA chest done 12/11/2020 revealed enlarging pericardial effusion, no evidence of pulmonary emboli, wall thickening noted in the descending thoracic aorta, enlarging left pleural effusion with underlying left lower lobe consolidation.  Repeated limited 2D echo done on 12/12/2020, cardiology will see in consultation.  12/12/20: Seen and examined  with her husband at her bedside.  She mainly reports right-sided neck pain.  She was able to feed herself.  No reported chest pain at a time of this visit.   Assessment/Plan: Principal Problem:   Hyponatremia Active Problems:   Hypertension   Proximal weakness of extremity   Secondary glaucoma    Adjustment disorder with mixed anxiety and depressed mood   Anemia of chronic disease   Thrombocytosis   Hypoalbuminemia   Memory loss   Pressure injury of skin   Benign neoplasm of cecum   Benign neoplasm of descending colon   Hiatal hernia   Malnutrition of moderate degree   Hypovolemic hyponatremia likely multifactorial secondary to SSRI, SIADH, poor oral intake Presented with serum sodium 125 Serum sodium 126 Continue salt tablets 1 g 3 times daily She is on Lexapro and has seen some improvement of her mood/depression while on this medication, will continue for now. Repeat serum sodium this afternoon  Newly diagnosed large pericardial effusion seen on CT scan, unclear etiology. Also has a component of moderate to large left pleural effusion seen on CT scan 12/11/20. Personally reviewed imaging. Discussed findings with cardiology, Dr. Sallyanne Kuster, who will see in consultation. Closely monitor on telemetry.  Newly diagnosed fluid collection 2 x 1 x 1 cm at the C7 spine  Rule out inflammatory process versus infective process Possible fluid aspiration by interventional radiology. Appreciate neurology's recommendations  Resolved post repletion: Refractory hypokalemia K+ 4.2 from 3.1. Magnesium 1.8 Repeat BMP in the morning  Iron deficiency anemia, unclear etiology, no evidence of bleeding on EGD or colonoscopy. Hemoglobin dropped to 7.4 with an MCV of 77, FOBT negative. Iron studies suggestive of iron deficiency. Continue ferrous sulfate 325 mg daily. She had an endoscopy and colonoscopy done on 12/09/2020 which did not explain her anemia.  GI signed off. Seen by hematology, recommends to keep hemoglobin greater than 9.0.  Will follow-up outpatient. Hemoglobin downtrending this morning 8.6 from 9.3. Repeat H&H this afternoon. Continue to monitor H&H and keep hemoglobin greater than 9.0.  Elevated inflammatory markers, unclear etiology. Does not appear to be infective related  with normal lactic acid level and normal procalcitonin, afebrile with no leukocytosis. Inflammatory markers returned significantly elevated CRP 12.3, ESR 112 on 12/09/2020.  Repeated inflammatory markers downtrending, CRP 11.5, sed rate 83 on 12/10/2020. Neurology has been consulted on 12/10/2020. Will likely need to follow-up with rheumatology outpatient. Ongoing work-up.  Generalized weakness, suspect multifactorial secondary to hypokalemia, hypomagnesemia, anemia, and possibly inflammatory disorder. Spoke with the patient's daughter who is concerned about her generalized weakness.   UA negative, CT head non acute showing old B/L cerebellar infarcts as well as Atrophy with small vessel chronic ischemic changes of deep cerebral white matter.  2D echo with normal LVEF 65-70%, no regional wall motion abnormalities, negative troponin.   Elevated inflammatory markers, rule out inflammatory disorder. CPK was not elevated less than 5. Optimize potassium, magnesium level, Hg.  History of prior bilateral cerebellar CVA  CT head done on 12/08/2020 revealed old bilateral cerebral infarcts. Patient and daughter were not aware of prior stroke. Neurology consulted. Continue PT OT with assistance and fall precautions C/w daily aspirin 81 mg LDL 82, goal less than 70  Hyperlipidemia LDL 82 Goal less than 70 Patient has not tolerated statin in the past.  Essential hypertension Blood pressure is improved on Norvasc 5 mg daily. Continue to monitor vital signs.  Memory loss possibly secondary to her depression. Family reports patient has had intermittent confusion  and memory loss.   She has been worked up by Dr. Allyson Sabal as well for this and negative for dementia. -Continue outpatient follow-up with Dr. Allyson Sabal  Severe protein calorie malnutrition BMI 17 Albumin 1.6 on 12/09/2020. Encourage increase oral protein calorie intake. Dietary consulted.  Resolved acute leukocytosis likely reactive Currently not  on antibiotics  No evidence of active infective process.   Nonseptic appearing. Urine analysis and chest x-ray negative.   Procalcitonin and lactic acid negative. 2 cm fluid collection at the C7 level seen on cervical spine MRI, might need to be aspirated to rule out an abscess.  Cardiomegaly: Acute.   Chest x-ray noted cardiomegaly without signs of edema.  BNP was noted to be 213.71 on 2/25. 2D echo done on 12/09/2020 showed LVEF 65 to 70% with grade 1 diastolic dysfunction as well as small pericardiac effusion with no evidence of cardiac tamponade. Continue strict I's and O's and daily weight. Net I&O positive 918cc.  Proximal extremity weakness:  Patient has been being worked up in the outpatient setting by Dr. Allyson Sabal of neurology and appears to have extensive work-up done at the end of last year thyroid studies were noted to be within normal limits.  Recently had normal EMG studies. -PT/assessment recommended home health PT. TOC consulted to assist with home health services arrangement.  Thrombocytosis: Acute.  Likely reactive.  Chronic anxiety/depression:  Stable at the time of this exam. Continue home Lexapro.  Secondary glaucoma -Continue regimen of eyedrops -If PMR is ruled in, patient will require ophthalmology consult prior to starting steroids.  Physical debility Continue PT OT with assistance Continue fall precautions.     Code Status: Full code.  Family Communication: Updated her husband at bedside  Disposition Plan: Likely will discharge to home on 12/16/2020.   Consultants:  GI  Hematology/medical oncology.  Neurology on 12/10/2020.  Cardiology  Procedures:  EGD  Colonoscopy.  Antimicrobials:  None.  DVT prophylaxis: SCDs.  Status is: Inpatient    Dispo:  Patient From: Home  Planned Disposition: Home with Health Care Svc  Anticipated discharge date 12/16/2020 or when neurology and cardiology sign off.  Medically stable for  discharge: No, ongoing management of generalized weakness with ongoing work-up large pericardial effusion.          Objective: Vitals:   12/11/20 0942 12/11/20 1645 12/11/20 2017 12/12/20 0936  BP: (!) 155/74 (!) 148/52 (!) 140/55 (!) 166/66  Pulse:  85 94 83  Resp:  20 (!) 21 15  Temp:  99.3 F (37.4 C) 97.6 F (36.4 C) 98 F (36.7 C)  TempSrc:   Oral Oral  SpO2:  96% 95% 95%    Intake/Output Summary (Last 24 hours) at 12/12/2020 1016 Last data filed at 12/11/2020 2027 Gross per 24 hour  Intake 360 ml  Output 1000 ml  Net -640 ml   There were no vitals filed for this visit.  Exam:  . General: 75 y.o. year-old female frail, weak appearing in no acute distress.  She is alert and interactive.   . Cardiovascular: Regular rate and rhythm with no rubs or gallops.  Respiratory: Faint rales at bases no wheezing noted.  Good inspiratory effort. . Abdomen: Soft nontender normal bowel sounds present . Musculoskeletal: No lower extremity edema bilaterally. . Skin: No ulcerative lesions noted. Marland Kitchen Psychiatry: Mood is appropriate for condition and setting.  Data Reviewed: CBC: Recent Labs  Lab 12/06/20 1341 12/07/20 1633 12/07/20 1633 12/09/20 0203 12/09/20 8786 12/09/20 1652 12/10/20 0115 12/11/20 0259 12/12/20  0153  WBC 11.0* 11.3*  --  9.0  --   --  8.3 10.4 10.7*  NEUTROABS 8.9*  --   --   --   --   --  6.4  --   --   HGB 8.7* 8.5*   < > 7.4* 8.4* 8.0* 9.4* 9.3* 8.6*  HCT 28.4* 29.0*   < > 24.6* 26.7* 26.8* 28.9* 28.7* 26.5*  MCV 79 79.5*  --  77.4*  --   --  77.7* 78.4* 77.7*  PLT 666* 686*  --  550*  --   --  530* 580* 554*   < > = values in this interval not displayed.   Basic Metabolic Panel: Recent Labs  Lab 12/08/20 0228 12/08/20 1112 12/09/20 1652 12/10/20 0115 12/11/20 0243 12/11/20 1411 12/12/20 0153  NA  --    < > 129* 131* 127* 130* 126*  K  --    < > 3.8 3.4* 3.3* 3.1* 4.2  CL  --    < > 98 96* 94* 96* 94*  CO2  --    < > '23 26 23 25 25  ' GLUCOSE   --    < > 100* 120* 115* 153* 115*  BUN  --    < > <5* 6* 9 7* 10  CREATININE  --    < > 0.32* 0.34* 0.37* 0.33* 0.35*  CALCIUM  --    < > 8.4* 8.4* 8.1* 7.9* 8.0*  MG 1.7  --   --  1.7 1.6* 2.0 1.8  PHOS  --   --   --  4.0  --   --   --    < > = values in this interval not displayed.   GFR: Estimated Creatinine Clearance: 45.7 mL/min (A) (by C-G formula based on SCr of 0.35 mg/dL (L)). Liver Function Tests: Recent Labs  Lab 12/06/20 1341 12/08/20 1112 12/09/20 0203 12/10/20 0115  AST 42* '29 28 16  ' ALT 60* 50* 46* 33  ALKPHOS 163* 115 121 108  BILITOT <0.2 0.4 0.5 1.2  PROT 7.2 6.1* 5.9* 5.9*  ALBUMIN 2.7* 1.6* 1.6* 1.6*   No results for input(s): LIPASE, AMYLASE in the last 168 hours. No results for input(s): AMMONIA in the last 168 hours. Coagulation Profile: No results for input(s): INR, PROTIME in the last 168 hours. Cardiac Enzymes: Recent Labs  Lab 12/10/20 0543  CKTOTAL <5*   BNP (last 3 results) No results for input(s): PROBNP in the last 8760 hours. HbA1C: No results for input(s): HGBA1C in the last 72 hours. CBG: No results for input(s): GLUCAP in the last 168 hours. Lipid Profile: Recent Labs    12/11/20 0243  CHOL 127  HDL 27*  LDLCALC 82  TRIG 88  CHOLHDL 4.7   Thyroid Function Tests: No results for input(s): TSH, T4TOTAL, FREET4, T3FREE, THYROIDAB in the last 72 hours. Anemia Panel: No results for input(s): VITAMINB12, FOLATE, FERRITIN, TIBC, IRON, RETICCTPCT in the last 72 hours. Urine analysis:    Component Value Date/Time   COLORURINE YELLOW 12/08/2020 0129   APPEARANCEUR HAZY (A) 12/08/2020 0129   APPEARANCEUR Clear 11/21/2020 1644   LABSPEC 1.019 12/08/2020 0129   PHURINE 6.0 12/08/2020 0129   GLUCOSEU NEGATIVE 12/08/2020 0129   HGBUR NEGATIVE 12/08/2020 0129   BILIRUBINUR NEGATIVE 12/08/2020 0129   BILIRUBINUR Negative 11/21/2020 [place] 12/08/2020 0129   PROTEINUR NEGATIVE 12/08/2020 0129   UROBILINOGEN  negative 05/13/2015 0827   NITRITE NEGATIVE 12/08/2020 0129  LEUKOCYTESUR NEGATIVE 12/08/2020 0129   Sepsis Labs: '@LABRCNTIP' (procalcitonin:4,lacticidven:4)  ) Recent Results (from the past 240 hour(s))  Resp Panel by RT-PCR (Flu A&B, Covid) Nasopharyngeal Swab     Status: None   Collection Time: 12/08/20  2:34 AM   Specimen: Nasopharyngeal Swab; Nasopharyngeal(NP) swabs in vial transport medium  Result Value Ref Range Status   SARS Coronavirus 2 by RT PCR NEGATIVE NEGATIVE Final    Comment: (NOTE) SARS-CoV-2 target nucleic acids are NOT DETECTED.  The SARS-CoV-2 RNA is generally detectable in upper respiratory specimens during the acute phase of infection. The lowest concentration of SARS-CoV-2 viral copies this assay can detect is 138 copies/mL. A negative result does not preclude SARS-Cov-2 infection and should not be used as the sole basis for treatment or other patient management decisions. A negative result may occur with  improper specimen collection/handling, submission of specimen other than nasopharyngeal swab, presence of viral mutation(s) within the areas targeted by this assay, and inadequate number of viral copies(<138 copies/mL). A negative result must be combined with clinical observations, patient history, and epidemiological information. The expected result is Negative.  Fact Sheet for Patients:  EntrepreneurPulse.com.au  Fact Sheet for Healthcare Providers:  IncredibleEmployment.be  This test is no t yet approved or cleared by the [place] FDA and  has been authorized for detection and/or diagnosis of SARS-CoV-2 by FDA under an Emergency Use Authorization (EUA). This EUA will remain  in effect (meaning this test can be used) for the duration of the COVID-19 declaration under Section 564(b)(1) of the Act, 21 U.S.C.section 360bbb-3(b)(1), unless the authorization is terminated  or revoked sooner.       Influenza A  by PCR NEGATIVE NEGATIVE Final   Influenza B by PCR NEGATIVE NEGATIVE Final    Comment: (NOTE) The Xpert Xpress SARS-CoV-2/FLU/RSV plus assay is intended as an aid in the diagnosis of influenza from Nasopharyngeal swab specimens and should not be used as a sole basis for treatment. Nasal washings and aspirates are unacceptable for Xpert Xpress SARS-CoV-2/FLU/RSV testing.  Fact Sheet for Patients: EntrepreneurPulse.com.au  Fact Sheet for Healthcare Providers: IncredibleEmployment.be  This test is not yet approved or cleared by the [place] FDA and has been authorized for detection and/or diagnosis of SARS-CoV-2 by FDA under an Emergency Use Authorization (EUA). This EUA will remain in effect (meaning this test can be used) for the duration of the COVID-19 declaration under Section 564(b)(1) of the Act, 21 U.S.C. section 360bbb-3(b)(1), unless the authorization is terminated or revoked.  Performed at [facility], [place] [address], [place], [place] [address]       Studies: EEG adult  Result Date: 12/28/2020 Lora Havens, MD     12/28/2020  1:24 PM Patient Name: Mckenzie [name] MRN: [id number] Epilepsy Attending: Lora Havens Referring Physician/Provider: Dr Lesleigh Noe Date: 12/28/2020 Duration: 26.02 mins Patient history: 75 year old female with worsening weakness. EEG to evaluate for seizures. Level of alertness: Awake AEDs during EEG study: None Technical aspects: This EEG study was done with scalp electrodes positioned according to the 10-20 International system of electrode placement. Electrical activity was acquired at a sampling rate of '500Hz'  and reviewed with a high frequency filter of '70Hz'  and a low frequency filter of '1Hz' . EEG data were recorded continuously and digitally stored. Description: The posterior dominant rhythm consists of '8Hz'  activity of moderate voltage (25-35 uV) seen predominantly in posterior head regions,  symmetric and reactive to eye opening and eye closing. EEG showed intermittent generalized 3  to 5 Hz theta-delta slowing. Hyperventilation did not show any EEG change. Physiologic photic driving was not seen during photic stimulation.    ABNORMALITY -Intermittent slow, generalized IMPRESSION: This study is suggestive of mild diffuse encephalopathy, nonspecific etiology. No seizures or epileptiform discharges were seen throughout the recording. Priyanka Barbra Sarks   CT ANGIO CHEST AORTA W/CM &/OR WO/CM  Result Date: 12/11/2020 CLINICAL DATA:  History gyn cell arteritis EXAM: CT ANGIOGRAPHY CHEST WITH CONTRAST TECHNIQUE: Multidetector CT imaging of the chest was performed using the standard protocol during bolus administration of intravenous contrast. Multiplanar CT image reconstructions and MIPs were obtained to evaluate the vascular anatomy. CONTRAST:  119m OMNIPAQUE IOHEXOL 350 MG/ML SOLN COMPARISON:  12/08/2020, 11/30/2020 CT of the abdomen FINDINGS: Cardiovascular: Initial precontrast images demonstrate atherosclerotic calcifications without aneurysmal dilatation. No hyperdense crescent to suggest acute aortic injury is seen. Post-contrast images demonstrate no aneurysmal dilatation or dissection of the thoracic aorta. Mild wall thickening is noted diffusely throughout the descending thoracic aorta. Heart is enlarged in size. The pulmonary artery shows a normal branching pattern without evidence of intraluminal filling defect to suggest pulmonary embolism. No significant coronary calcifications are noted. Pericardial effusion is noted slightly larger than that seen on prior exam. Mediastinum/Nodes: Thoracic inlet is within normal limits. No sizable hilar or mediastinal adenopathy is noted. The esophagus is within normal limits. Lungs/Pleura: Diffuse emphysematous changes of lungs are noted. Large left-sided pleural effusion is noted which is increased from the prior CT of the chest. Left lower lobe consolidation  is noted new from the prior exam. Small right-sided pleural effusion with dependent atelectasis is seen. No sizable parenchymal nodules are noted. Upper Abdomen: Visualized upper abdomen shows no acute abnormality. Musculoskeletal: Degenerative changes of the thoracic spine are noted. No acute bony abnormality is seen. Review of the MIP images confirms the above findings. IMPRESSION: No evidence of pulmonary emboli. Enlarging pericardial effusion. Wall thickening is noted in the descending thoracic aorta. This would correspond with the given clinical history although nonspecific in nature. Enlarging left-sided pleural effusion with underlying left lower lobe consolidation. Aortic Atherosclerosis (ICD10-I70.0) and Emphysema (ICD10-J43.9). Electronically Signed   By: MInez CatalinaM.D.   On: 12/11/2020 18:34    Scheduled Meds: . amLODipine  2.5 mg Oral Daily  . aspirin EC  81 mg Oral Daily  . timolol  1 drop Both Eyes BID   And  . brimonidine  1 drop Both Eyes BID  . dorzolamide  1 drop Both Eyes BID  . escitalopram  20 mg Oral Daily  . feeding supplement  237 mL Oral BID BM  . ferrous sulfate  325 mg Oral Q breakfast  . fluorometholone  1 drop Right Eye BID  . ketorolac  1 drop Right Eye Daily  . latanoprost  1 drop Both Eyes QHS  . linaclotide  145 mcg Oral QAC breakfast  . multivitamin with minerals  1 tablet Oral Daily  . Netarsudil Dimesylate  1 drop Both Eyes BID  . potassium chloride  20 mEq Oral Daily  . sodium chloride flush  3 mL Intravenous Q12H  . sodium chloride  1 g Oral TID WC    Continuous Infusions:    LOS: 4 days     CKayleen Memos MD [facility] Pager [phone] If 7PM-7AM, please contact night-coverage [email or web address] Password TWestern [facility] Center3/05/2021, 10:16 AM

## 2020-12-12 NOTE — Progress Notes (Signed)
Events in the last few days noted.  Laboratory data and imaging studies personally reviewed.  Etiology of her symptoms remain unclear and that work-up is currently ongoing.  Differential diagnosis including PMR versus abscess among other considerations.  It does not appear that these findings are related to a primary hematological condition.  Her anemia appears to be a consequence of a chronic inflammatory condition rather than the cause of it.  Chronic inflammatory conditions such as PMR or abscess is likely causing anemia of chronic disease.  I do not believe her anemia is the cause of her symptoms and its possibly of consequence.  I recommended supportive care for her anemia at this point without any further hematological work-up.  Repeat transfusion would be reasonable to keep her hemoglobin above 9 till the etiology is identified. Treating the underlying condition is the best solution to her anemia of chronic disease.

## 2020-12-12 NOTE — Progress Notes (Signed)
  Echocardiogram 2D Echocardiogram has been performed.  Mckenzie [name] 12/12/2020, 11:54 AM

## 2020-12-12 NOTE — Care Management Important Message (Signed)
Important Message  Patient Details  Name: Mckenzie [name] MRN: [id number] Date of Birth: Sep 29, 1946   Medicare Important Message Given:  Yes     [name] 12/12/2020, 3:25 PM

## 2020-12-12 NOTE — Evaluation (Signed)
Occupational Therapy Evaluation Patient Details Name: Mckenzie [name] MRN: [id number] DOB: Jun 02, 1946 Today's Date: 12/12/2020    History of Present Illness Pt is a 75 y.o. female who presented to the hospital 12/08/20 after being advised by her PCP to come in for low blood counts and sodium levels. Of note, over past 3 months since her fall, hospitalziation, and s/p ORIF of her L distal radial fx she has progressively declined functionally and cognitively. Per chart, pt does not have dementia. 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 PMH: HTN, depression, high cholesterol, and retinal micro-aneurysm of R eye.   Clinical Impression   PTA, pt lives with spouse and family provides 24/7 assist. Pt has been receiving assist for dressing, bathing and toileting tasks. Pt typically ambulatory with standard walker but has hx of falls. Pt presents now with diagnoses above and deficits in strength, standing balance, cognition, and endurance. Pt received in bed, noted with bowel incontinence with initiation of bed mobility. Pt overall Min A for UB ADLs and Max A for LB ADLs, including peri care and cleanup after BM. Pt overall Mod A for pivotal steps via RW with consistent cues needed for sequencing and safety. Pt's husband present and engaged in education. Recommend HHOT follow-up, BSC to trial at night and RW to ease mobility at home. Also encouraged tub bench if stepping over tub becomes too difficult at home.     Follow Up Recommendations  Home health OT;Supervision/Assistance - 24 hour    Equipment Recommendations  3 in 1 bedside commode;Other (comment) (RW)    Recommendations for Other Services       Precautions / Restrictions Precautions Precautions: Fall;Other (comment) Precaution Comments: intermittent dizziness Restrictions Weight Bearing Restrictions: No      Mobility Bed Mobility Overal bed mobility: Needs Assistance Bed Mobility: Supine to Sit;Sit to Supine     Supine to sit:  Min assist;HOB elevated Sit to supine: Min assist;HOB elevated   General bed mobility comments: Min A to get LE fully off of bed and advance trunk with Min A to get LE back into bed    Transfers Overall transfer level: Needs assistance Equipment used: Rolling walker (2 wheeled) Transfers: Sit to/from [facility] Sit to Stand: Min assist Stand pivot transfers: Mod assist       General transfer comment: Up to Mod A for transfers to maintain balance due to posterior lean, improving during session. Attempting to step outside or around RW, requiring assist to manuever and advance RW    Balance Overall balance assessment: Needs assistance Sitting-balance support: Bilateral upper extremity supported;Feet supported Sitting balance-Leahy Scale: Fair   Postural control: Posterior lean Standing balance support: Bilateral upper extremity supported;During functional activity Standing balance-Leahy Scale: Poor Standing balance comment: Reliant on bil UEs on RW and external support for standing balance.                           ADL either performed or assessed with clinical judgement   ADL Overall ADL's : Needs assistance/impaired Eating/Feeding: Set up;Sitting Eating/Feeding Details (indicate cue type and reason): husband assisting with setup at bed level for lunch Grooming: Set up;Sitting   Upper Body Bathing: Minimal assistance;Sitting   Lower Body Bathing: Maximal assistance;Sit to/from stand Lower Body Bathing Details (indicate cue type and reason): Max A for thorough bathing after bowel incontinence on side,hip, front, etc Upper Body Dressing : Minimal assistance;Sitting Upper Body Dressing Details (indicate cue type and  reason): Min A to don clean gown Lower Body Dressing: Maximal assistance;Sit to/from stand   Toilet Transfer: Moderate assistance;Stand-pivot;BSC;RW Toilet Transfer Details (indicate cue type and reason): Mod A overall, assist to   maintain balance with posterior lean, assist to manuever RW as pt attempting to step away from [employer]- Clothing Manipulation and Hygiene: Maximal assistance;Sit to/from stand Toileting - Clothing Manipulation Details (indicate cue type and reason): Max A in standing for peri care after using BSC       General ADL Comments: Limited primarily by cognition with posterior LOB and decreased safety awareness. Pt with no reports of dizziness today     Vision Patient Visual Report: No change from baseline Vision Assessment?: No apparent visual deficits     Perception     Praxis      Pertinent Vitals/Pain Pain Assessment: No/denies pain     Hand Dominance Right   Extremity/Trunk Assessment Upper Extremity Assessment Upper Extremity Assessment: Generalized weakness   Lower Extremity Assessment Lower Extremity Assessment: Defer to PT evaluation   Cervical / Trunk Assessment Cervical / Trunk Assessment: Normal   Communication Communication Communication: No difficulties   Cognition Arousal/Alertness: Awake/alert Behavior During Therapy: WFL for tasks assessed/performed Overall Cognitive Status: History of cognitive impairments - at baseline Area of Impairment: Memory;Following commands;Safety/judgement;Awareness;Problem solving                     Memory: Decreased short-term memory Following Commands: Follows one step commands with increased time Safety/Judgement: Decreased awareness of safety;Decreased awareness of deficits Awareness: Intellectual Problem Solving: Slow processing;Decreased initiation;Difficulty sequencing;Requires verbal cues;Requires tactile cues General Comments: Hx of cognitive impairments at baseline, notable STM deficits during session. Decreased recall for hand placement, use of DME during transfers requiring multimodal cues   General Comments  Husband present during session, providing PLOF info, etc. Educated on tub transfer bench option if  stepping over tub seems to be too difficult at home. Educated on trial of BSC at bedside to use during the night to maximize dignity with toileting tasks and decrease fall risk. Educated on trial of RW for ease of mobility rather than SW    Exercises     Shoulder Instructions      Home Living Family/patient expects to be discharged to:: Private residence Living Arrangements: Spouse/significant other Available Help at Discharge: Family;Available 24 hours/day Type of Home: House Home Access: Stairs to enter [employer] of Steps: 4 Entrance Stairs-Rails: None Home Layout: One level     Bathroom Shower/Tub: Teacher, early years/pre: Handicapped height Bathroom Accessibility: Yes   Home Equipment: Shower seat;Grab bars - tub/shower;Bedside commode;Walker - standard;Grab bars - toilet;Toilet riser   Additional Comments: Husband ordered and installed a grab bar at tub that is also within reach of toilet to assist with transfers      Prior Functioning/Environment Level of Independence: Needs assistance  Gait / Transfers Assistance Needed: Pt's daughter reports pt has good and bad days and normally needs minA to get out of bed initially in the day due to stiffness then minA-min guard for all functional mobility with a walker. Pt able to ambulate lap around house but needs close supervision for safety as some days she gets dizzy spontaneously. Needs +2 for stairs. ADL's / Homemaking Assistance Needed: Husband reports, family assist with tub transfers, showering tasks, hygiene after toileting. Wears depends at night and changed 3-5x/night to avoid having to ambulate to bathroom (fearful of falling)   Comments: several falls in past  year        OT Problem List: Decreased strength;Decreased activity tolerance;Impaired balance (sitting and/or standing);Decreased cognition;Decreased safety awareness;Decreased knowledge of use of DME or AE      OT Treatment/Interventions:  Self-care/ADL training;Therapeutic exercise;Energy conservation;DME and/or AE instruction;Therapeutic activities;Balance training;Patient/family education    OT Goals(Current goals can be found in the care plan section) Acute Rehab OT Goals Patient Stated Goal: decrease falls, return home OT Goal Formulation: With patient Time For Goal Achievement: 12/26/20 Potential to Achieve Goals: Good ADL Goals Pt Will Perform Lower Body Dressing: with min assist;sitting/lateral leans;sit to/from stand Pt Will Transfer to Toilet: with supervision;ambulating Pt Will Perform Toileting - Clothing Manipulation and hygiene: with min assist;sitting/lateral leans;sit to/from stand Pt Will Perform Tub/Shower Transfer: Tub transfer;with min guard assist;ambulating;shower seat;rolling walker  OT Frequency: Min 2X/week   Barriers to D/C:            Co-evaluation              AM-PAC OT "6 Clicks" Daily Activity     Outcome Measure Help from another person eating meals?: A Little Help from another person taking care of personal grooming?: A Little Help from another person toileting, which includes using toliet, bedpan, or urinal?: A Lot Help from another person bathing (including washing, rinsing, drying)?: A Lot Help from another person to put on and taking off regular upper body clothing?: A Little Help from another person to put on and taking off regular lower body clothing?: A Lot 6 Click Score: 15   End of Session Equipment Utilized During Treatment: Rolling walker Nurse Communication: Mobility status;Other (comment) (BM, need for new purewick)  Activity Tolerance: Patient tolerated treatment well Patient left: in bed;with call bell/phone within reach;with bed alarm set;with family/visitor present  OT Visit Diagnosis: Unsteadiness on feet (R26.81);Other abnormalities of gait and mobility (R26.89);Muscle weakness (generalized) (M62.81);History of falling (Z91.81);Other symptoms and signs  involving cognitive function                Time: 5790-3833 OT Time Calculation (min): 47 min Charges:  OT General Charges $OT Visit: 1 Visit OT Evaluation $OT Eval Moderate Complexity: 1 Mod OT Treatments $Self Care/Home Management : 23-37 mins  Malachy Chamber, OTR/L Acute Rehab Services Office: [phone]  Layla Maw 12/12/2020, 1:59 PM

## 2020-12-12 NOTE — TOC Progression Note (Signed)
Transition of Care [facility]) - Progression Note    Patient Details  Name: Mckenzie [name] MRN: [id number] Date of Birth: 11-02-1945  Transition of Care [facility]) CM/SW [place], RN Phone Number: [phone]  12/12/2020, 4:04 PM  Clinical Narrative:    DME- 3in1 and rolling walker ordered and to be delivered per [facility].        Expected Discharge Plan and Services                           DME Arranged: 3-N-1,Walker rolling DME Agency: [employer] Date DME Agency Contacted: 12/12/20 Time DME Agency Contacted: [phone] Representative spoke with at DME Agency: Adela Lank             Social Determinants of Health ([place]) Interventions    Readmission Risk Interventions No flowsheet data found.

## 2020-12-13 ENCOUNTER — Telehealth: Payer: Self-pay | Admitting: Licensed Clinical Social Worker

## 2020-12-13 ENCOUNTER — Inpatient Hospital Stay (HOSPITAL_COMMUNITY): Payer: Medicare Other

## 2020-12-13 DIAGNOSIS — E871 Hypo-osmolality and hyponatremia: Secondary | ICD-10-CM | POA: Diagnosis not present

## 2020-12-13 HISTORY — PX: IR THORACENTESIS ASP PLEURAL SPACE W/IMG GUIDE: IMG5380

## 2020-12-13 LAB — CBC
HCT: 27.6 % — ABNORMAL LOW (ref 36.0–46.0)
Hemoglobin: 9 g/dL — ABNORMAL LOW (ref 12.0–15.0)
MCH: 25.5 pg — ABNORMAL LOW (ref 26.0–34.0)
MCHC: 32.6 g/dL (ref 30.0–36.0)
MCV: 78.2 fL — ABNORMAL LOW (ref 80.0–100.0)
Platelets: 504 10*3/uL — ABNORMAL HIGH (ref 150–400)
RBC: 3.53 MIL/uL — ABNORMAL LOW (ref 3.87–5.11)
RDW: 16.8 % — ABNORMAL HIGH (ref 11.5–15.5)
WBC: 9.5 10*3/uL (ref 4.0–10.5)
nRBC: 0 % (ref 0.0–0.2)

## 2020-12-13 LAB — COMPREHENSIVE METABOLIC PANEL
ALT: 31 U/L (ref 0–44)
AST: 25 U/L (ref 15–41)
Albumin: 1.6 g/dL — ABNORMAL LOW (ref 3.5–5.0)
Alkaline Phosphatase: 105 U/L (ref 38–126)
Anion gap: 9 (ref 5–15)
BUN: 11 mg/dL (ref 8–23)
CO2: 22 mmol/L (ref 22–32)
Calcium: 8.1 mg/dL — ABNORMAL LOW (ref 8.9–10.3)
Chloride: 95 mmol/L — ABNORMAL LOW (ref 98–111)
Creatinine, Ser: 0.32 mg/dL — ABNORMAL LOW (ref 0.44–1.00)
GFR, Estimated: >60 mL/min (ref >60)
Glucose, Bld: 140 mg/dL — ABNORMAL HIGH (ref 70–99)
Potassium: 4.1 mmol/L (ref 3.5–5.1)
Sodium: 126 mmol/L — ABNORMAL LOW (ref 135–145)
Total Bilirubin: 0.2 mg/dL — ABNORMAL LOW (ref 0.3–1.2)
Total Protein: 5.9 g/dL — ABNORMAL LOW (ref 6.5–8.1)

## 2020-12-13 LAB — BASIC METABOLIC PANEL
Anion gap: 8 (ref 5–15)
BUN: 9 mg/dL (ref 8–23)
CO2: 24 mmol/L (ref 22–32)
Calcium: 8.1 mg/dL — ABNORMAL LOW (ref 8.9–10.3)
Chloride: 95 mmol/L — ABNORMAL LOW (ref 98–111)
Creatinine, Ser: 0.37 mg/dL — ABNORMAL LOW (ref 0.44–1.00)
GFR, Estimated: >60 mL/min (ref >60)
Glucose, Bld: 125 mg/dL — ABNORMAL HIGH (ref 70–99)
Potassium: 4.4 mmol/L (ref 3.5–5.1)
Sodium: 127 mmol/L — ABNORMAL LOW (ref 135–145)

## 2020-12-13 LAB — LACTATE DEHYDROGENASE, PLEURAL OR PERITONEAL FLUID: LD, Fluid: 45 U/L — ABNORMAL HIGH (ref 3–23)

## 2020-12-13 LAB — MAGNESIUM: Magnesium: 1.8 mg/dL (ref 1.7–2.4)

## 2020-12-13 LAB — LACTATE DEHYDROGENASE: LDH: 109 U/L (ref 98–192)

## 2020-12-13 LAB — BODY FLUID CELL COUNT WITH DIFFERENTIAL
Eos, Fluid: 2 %
Lymphs, Fluid: 78 %
Monocyte-Macrophage-Serous Fluid: 14 % — ABNORMAL LOW (ref 50–90)
Neutrophil Count, Fluid: 6 % (ref 0–25)
Total Nucleated Cell Count, Fluid: 695 cu mm (ref 0–1000)

## 2020-12-13 LAB — GLUCOSE, PLEURAL OR PERITONEAL FLUID: Glucose, Fluid: 148 mg/dL

## 2020-12-13 LAB — GRAM STAIN

## 2020-12-13 LAB — PROTEIN, PLEURAL OR PERITONEAL FLUID: Total protein, fluid: <3 g/dL

## 2020-12-13 MED ORDER — COLCHICINE 0.6 MG PO TABS
0.6000 mg | ORAL_TABLET | Freq: Every day | ORAL | Status: DC
  Administered 2020-12-14 – 2020-12-19 (×6): 0.6 mg via ORAL
  Filled 2020-12-13 (×6): qty 1

## 2020-12-13 MED ORDER — INDOMETHACIN 25 MG PO CAPS
50.0000 mg | ORAL_CAPSULE | Freq: Two times a day (BID) | ORAL | Status: DC
  Administered 2020-12-14 (×2): 50 mg via ORAL
  Filled 2020-12-13 (×3): qty 2

## 2020-12-13 MED ORDER — LIDOCAINE HCL 1 % IJ SOLN
INTRAMUSCULAR | Status: AC
  Filled 2020-12-13: qty 20

## 2020-12-13 NOTE — Procedures (Signed)
PROCEDURE SUMMARY:  Successful US guided left thoracentesis. Yielded 300 ml of clear yellow fluid. Pt tolerated procedure well. No immediate complications.  Specimen sent for labs. CXR ordered; no post-procedure pneumothorax identified  EBL < 1 mL  Theresa Duty, NP 12/13/2020 3:22 PM

## 2020-12-13 NOTE — Progress Notes (Signed)
Imaging studies personally reviewed including a CT scan of the chest.  The findings are not very specific with the bilateral pleural effusion over the left more the right as well as pericardial effusion.  He continues to have high inflammatory markers without any clear-cut diagnosis.  Although the imaging studies are not very specific for malignancy and I agree with pleural fluid evaluation.  Thoracentesis with cytology would be reasonable in this setting to rule out occult malignancy or unusual presentation of lymphoma.

## 2020-12-13 NOTE — Progress Notes (Signed)
OT Cancellation Note  Patient Details Name: ELLERY TASH MRN: [id number] DOB: December 30, 1945   Cancelled Treatment:    Reason Eval/Treat Not Completed: Patient at procedure or test/ unavailable Per RN, pt off unit for thoracentesis. Will follow-up for OT session again as time allows.   Layla Maw 12/13/2020, 2:10 PM

## 2020-12-13 NOTE — Progress Notes (Signed)
PROGRESS NOTE    Mckenzie [name]  JQG:[id number] DOB: 1946-04-17 DOA: 12/07/2020 PCP: Loman Brooklyn, FNP     Brief Narrative:  Mckenzie [name] a 75 y.o.femalewith medical history significant of hypertension, hyperlipidemia, chronic anxiety/depression whopresents after being advised by her primary care provider to come in for low blood counts and low sodium levels. Patient's daughter is present at bedside and provides much of the patient's history. Apparently over the last 3 months patient has been dealing with weakness and confusion. Last hospitalized on 10/05/2020 after having a fall, status post open reduction internal fixation ofa leftdistal radial fracture.Since that hospitalization patient daughter noted that she has had a progressive decline and now needs assistance to complete pretty much all of her ADLs. It seems she was started on Lexapro 2 months prior for adjustment disorder with anxiety and depression. Daughter notes that the medication has been helping and would like to continue this medication. She has been followed by Dr. Allyson Sabal of neurology for worsening confusion and weakness. Patient had been worked up for for her symptoms and had been told that she did not have dementia. Also reports patient having EMG studies that were negative. During this time patient sodium and hemoglobin levels have been trending down. No blood in stool, she has been on ferrous sulfate supplements. She had followed up with [facility] and was scheduled to have a colonoscopy on 12/11/20.  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   Her hemoglobin down trended to 7.4, iron studies suggestive of iron deficiency, negative FOBT.  She was seen by GI, she had an EGD and colonoscopy done on 12/09/2020 which findings did not explain her anemia.   Hematology/medical oncology recommended to optimize hemoglobin at least 9.0.  Hospital course complicated by persistent generalized weakness.   Inflammatory markers returned significantly elevated CRP 12.3, ESR 112 on 12/09/2020.   She had a CT cervical spine which revealed a 2 x 1 x 1 cm fluid collection at the C7.  She had an EEG on 12/11/20, no evidence of seizure.  CTA chest done 12/11/2020 revealed enlarging pericardial effusion, no evidence of pulmonary emboli, wall thickening noted in the descending thoracic aorta, enlarging left pleural effusion with underlying left lower lobe consolidation.   New events last 24 hours / Subjective: Patient seen with daughter Mckenzie [name] at bedside.  Patient has no complaints of shortness of breath or chest pain.  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 Sallyanne Kuster as previously mentioned by my partner was not notified of cardiology consultation yesterday.  Spoke with Dr. Doylene Canard who is covering new cardiology consults to see patient today  Left pleural effusion -Thoracentesis ordered today  C7 fluid  collection -MR cervical spine: Enhancement and edema about the dorsal tips of the C7 and T1 spinous processes, with a superimposed 2 cm collection adjacent to the C7 spinous process.  Findings are nonspecific, but can be seen in the setting of underlying inflammatory conditions including polymyalgia rheumatica. In this setting, the collection could reflect a small bursal collection. Possible infection would be the primary differential consideration, and could be considered in the correct clinical setting  -Patient without significant signs of systemic infection however with normal lactic acid, normal procalcitonin, normal temperature  Proximal muscle weakness -Has been worked up by Dr. Allyson Sabal of neurology as an outpatient.  Question possible PMR diagnosis  Anemia of chronic disease -Status post EGD, colonoscopy 3/5 without GI etiology of anemia.  GI has signed off -Appreciate hematology -Goal hemoglobin >9  History of bilateral cerebellar CVA -Continue aspirin  Hyperlipidemia -Patient has not tolerated statins in the past  Hypertension -Continue Norvasc  Chronic diastolic heart failure -Without evidence of acute exacerbation at this time  Generalized weakness -PT OT  Severe protein calorie malnutrition -Estimated body mass index is 17.47 kg/m as calculated from the following:   Height as of 12/06/20: '5\' 5"'  (1.651 m).   Weight as of 12/06/20: 47.6 kg.      In agreement with assessment of the pressure ulcer as below:  Pressure Injury 12/08/20 Coccyx Medial Stage 1 -  Intact skin with non-blanchable redness of a localized area usually over a bony prominence. not open,  purple area (Active)  12/08/20 1834  Location: Coccyx  Location Orientation: Medial  Staging: Stage 1 -  Intact skin with non-blanchable redness of a localized area usually over a bony prominence.  Wound Description (Comments): not open,  purple area  Present on Admission: Yes     Nutrition Problem: Moderate Malnutrition Etiology: chronic illness (HTN, hyperlipidemia)   DVT prophylaxis:  SCDs Start: 12/08/20 0856  Code Status: Full Family Communication: Daughter Mckenzie [name] at  bedside Disposition Plan:  Status is: Inpatient  Remains inpatient appropriate because:Ongoing diagnostic testing needed not appropriate for outpatient work up   Dispo:  Patient From: Home  Planned Disposition: Home with Health Care Svc  Medically stable for discharge: No. Thoracentesis today.         Antimicrobials:  Anti-infectives (From admission, onward)   None        Objective: Vitals:   12/12/20 1423 12/12/20 2122 12/13/20 0620 12/13/20 1405  BP: (!) 123/43 (!) 114/51 (!) 138/56 (!) 134/52  Pulse: 81 79 87   Resp: 16 18    Temp:  99.3 F (37.4 C) 98.6 F (37 C)   TempSrc:  Oral Oral   SpO2: 100% 96% 96%     Intake/Output Summary (Last 24 hours) at 12/13/2020 1421 Last data filed at 12/13/2020 4196 Gross per 24 hour  Intake --  Output 1200 ml  Net -1200 ml   There were no vitals filed for this visit.  Examination:  General exam: Appears calm and comfortable  Respiratory system: Diminished breath sounds on left. Respiratory effort normal. No respiratory distress. No conversational dyspnea.  On room air Cardiovascular system: S1 & S2 heard, RRR. No murmurs. No pedal edema. Gastrointestinal system: Abdomen is nondistended, soft and nontender. Normal bowel sounds heard. Central nervous system: Alert and oriented.  Extremities: Symmetric in appearance  Skin: No rashes, lesions or ulcers on exposed skin  Psychiatry: Judgement and insight appear normal. Mood & affect appropriate.   Data Reviewed: I have personally reviewed following labs and  imaging studies  CBC: Recent Labs  Lab 12/07/20 1633 12/09/20 0203 12/09/20 0754 12/09/20 1652 12/10/20 0115 12/11/20 0259 12/12/20 0153 12/12/20 1200  WBC 11.3* 9.0  --   --  8.3 10.4 10.7*  --   NEUTROABS  --   --   --   --  6.4  --   --   --   HGB 8.5* 7.4*   < > 8.0* 9.4* 9.3* 8.6* 9.0*  HCT 29.0* 24.6*   < > 26.8* 28.9* 28.7* 26.5* 28.1*  MCV 79.5* 77.4*  --   --  77.7* 78.4* 77.7*  --   PLT 686* 550*  --    --  530* 580* 554*  --    < > = values in this interval not displayed.   Basic Metabolic Panel: Recent Labs  Lab 12/10/20 0115 12/11/20 0243 12/11/20 1411 12/12/20 0153 12/12/20 1200 12/13/20 0252  NA 131* 127* 130* 126* 127* 127*  K 3.4* 3.3* 3.1* 4.2  --  4.4  CL 96* 94* 96* 94*  --  95*  CO2 '26 23 25 25  ' --  24  GLUCOSE 120* 115* 153* 115*  --  125*  BUN 6* 9 7* 10  --  9  CREATININE 0.34* 0.37* 0.33* 0.35*  --  0.37*  CALCIUM 8.4* 8.1* 7.9* 8.0*  --  8.1*  MG 1.7 1.6* 2.0 1.8  --  1.8  PHOS 4.0  --   --   --   --   --    GFR: Estimated Creatinine Clearance: 45.7 mL/min (A) (by C-G formula based on SCr of 0.37 mg/dL (L)). Liver Function Tests: Recent Labs  Lab 12/08/20 1112 12/09/20 0203 12/10/20 0115  AST '29 28 16  ' ALT 50* 46* 33  ALKPHOS 115 121 108  BILITOT 0.4 0.5 1.2  PROT 6.1* 5.9* 5.9*  ALBUMIN 1.6* 1.6* 1.6*   No results for input(s): LIPASE, AMYLASE in the last 168 hours. No results for input(s): AMMONIA in the last 168 hours. Coagulation Profile: No results for input(s): INR, PROTIME in the last 168 hours. Cardiac Enzymes: Recent Labs  Lab 12/10/20 0543  CKTOTAL <5*   BNP (last 3 results) No results for input(s): PROBNP in the last 8760 hours. HbA1C: No results for input(s): HGBA1C in the last 72 hours. CBG: No results for input(s): GLUCAP in the last 168 hours. Lipid Profile: Recent Labs    12/11/20 0243  CHOL 127  HDL 27*  LDLCALC 82  TRIG 88  CHOLHDL 4.7   Thyroid Function Tests: No results for input(s): TSH, T4TOTAL, FREET4, T3FREE, THYROIDAB in the last 72 hours. Anemia Panel: No results for input(s): VITAMINB12, FOLATE, FERRITIN, TIBC, IRON, RETICCTPCT in the last 72 hours. Sepsis Labs: Recent Labs  Lab 12/10/20 0543  PROCALCITON <0.10  LATICACIDVEN 0.8    Recent Results (from the past 240 hour(s))  Resp Panel by RT-PCR (Flu A&B, Covid) Nasopharyngeal Swab     Status: None   Collection Time: 12/08/20  2:34 AM    Specimen: Nasopharyngeal Swab; Nasopharyngeal(NP) swabs in vial transport medium  Result Value Ref Range Status   SARS Coronavirus 2 by RT PCR NEGATIVE NEGATIVE Final    Comment: (NOTE) SARS-CoV-2 target nucleic acids are NOT DETECTED.  The SARS-CoV-2 RNA is generally detectable in upper respiratory specimens during the acute phase of infection. The lowest concentration of SARS-CoV-2 viral copies this assay can detect is 138 copies/mL. A negative result does not preclude SARS-Cov-2 infection and should not be  used as the sole basis for treatment or other patient management decisions. A negative result may occur with  improper specimen collection/handling, submission of specimen other than nasopharyngeal swab, presence of viral mutation(s) within the areas targeted by this assay, and inadequate number of viral copies(<138 copies/mL). A negative result must be combined with clinical observations, patient history, and epidemiological information. The expected result is Negative.  Fact Sheet for Patients:  EntrepreneurPulse.com.au  Fact Sheet for Healthcare Providers:  IncredibleEmployment.be  This test is no t yet approved or cleared by the [place] FDA and  has been authorized for detection and/or diagnosis of SARS-CoV-2 by FDA under an Emergency Use Authorization (EUA). This EUA will remain  in effect (meaning this test can be used) for the duration of the COVID-19 declaration under Section 564(b)(1) of the Act, 21 U.S.C.section 360bbb-3(b)(1), unless the authorization is terminated  or revoked sooner.       Influenza A by PCR NEGATIVE NEGATIVE Final   Influenza B by PCR NEGATIVE NEGATIVE Final    Comment: (NOTE) The Xpert Xpress SARS-CoV-2/FLU/RSV plus assay is intended as an aid in the diagnosis of influenza from Nasopharyngeal swab specimens and should not be used as a sole basis for treatment. Nasal washings and aspirates are  unacceptable for Xpert Xpress SARS-CoV-2/FLU/RSV testing.  Fact Sheet for Patients: EntrepreneurPulse.com.au  Fact Sheet for Healthcare Providers: IncredibleEmployment.be  This test is not yet approved or cleared by the [place] FDA and has been authorized for detection and/or diagnosis of SARS-CoV-2 by FDA under an Emergency Use Authorization (EUA). This EUA will remain in effect (meaning this test can be used) for the duration of the COVID-19 declaration under Section 564(b)(1) of the Act, 21 U.S.C. section 360bbb-3(b)(1), unless the authorization is terminated or revoked.  Performed at [facility], [place] [address] [place], [place] [address]       Radiology Studies: CT ANGIO CHEST AORTA W/CM &/OR WO/CM  Result Date: 12/11/2020 CLINICAL DATA:  History gyn cell arteritis EXAM: CT ANGIOGRAPHY CHEST WITH CONTRAST TECHNIQUE: Multidetector CT imaging of the chest was performed using the standard protocol during bolus administration of intravenous contrast. Multiplanar CT image reconstructions and MIPs were obtained to evaluate the vascular anatomy. CONTRAST:  143m OMNIPAQUE IOHEXOL 350 MG/ML SOLN COMPARISON:  12/08/2020, 11/30/2020 CT of the abdomen FINDINGS: Cardiovascular: Initial precontrast images demonstrate atherosclerotic calcifications without aneurysmal dilatation. No hyperdense crescent to suggest acute aortic injury is seen. Post-contrast images demonstrate no aneurysmal dilatation or dissection of the thoracic aorta. Mild wall thickening is noted diffusely throughout the descending thoracic aorta. Heart is enlarged in size. The pulmonary artery shows a normal branching pattern without evidence of intraluminal filling defect to suggest pulmonary embolism. No significant coronary calcifications are noted. Pericardial effusion is noted slightly larger than that seen on prior exam. Mediastinum/Nodes: Thoracic inlet is within normal limits.  No sizable hilar or mediastinal adenopathy is noted. The esophagus is within normal limits. Lungs/Pleura: Diffuse emphysematous changes of lungs are noted. Large left-sided pleural effusion is noted which is increased from the prior CT of the chest. Left lower lobe consolidation is noted new from the prior exam. Small right-sided pleural effusion with dependent atelectasis is seen. No sizable parenchymal nodules are noted. Upper Abdomen: Visualized upper abdomen shows no acute abnormality. Musculoskeletal: Degenerative changes of the thoracic spine are noted. No acute bony abnormality is seen. Review of the MIP images confirms the above findings. IMPRESSION: No evidence of pulmonary emboli. Enlarging pericardial effusion. Wall thickening is  noted in the descending thoracic aorta. This would correspond with the given clinical history although nonspecific in nature. Enlarging left-sided pleural effusion with underlying left lower lobe consolidation. Aortic Atherosclerosis (ICD10-I70.0) and Emphysema (ICD10-J43.9). Electronically Signed   By: Inez Catalina M.D.   On: 12/11/2020 18:34   ECHOCARDIOGRAM LIMITED  Result Date: 12/12/2020    ECHOCARDIOGRAM LIMITED REPORT   Patient Name:   Mckenzie [name] Date of Exam: 12/12/2020 Medical Rec #:  [id number]         Height:       65.0 in Accession #:    [id number]        Weight:       105.0 lb Date of Birth:  [birth year]          BSA:          1.504 m Patient Age:    89 years          BP:           166/64 mmHg Patient Gender: F                 HR:           83 bpm. Exam Location:  Inpatient Procedure: Limited Echo Indications:    Pericardial effusion  History:        Patient has prior history of Echocardiogram examinations, most                 recent 12/08/2020. Pericardial Disease, Stroke; Risk                 Factors:Hypertension and Dyslipidemia. Pericardial effusion,                 pleural effusion.  Sonographer:    Dustin Flock Referring Phys: [id number] Kayleen Memos   Sonographer Comments: Follow up pericardial effusion from 12/08/20 IMPRESSIONS  1. Left ventricular ejection fraction, by estimation, is 55 to 60%. The left ventricle has normal function.  2. Right ventricular systolic function is normal. The right ventricular size is normal.  3. There is a small - moderate sized pericardial effusion. The IVC is normal sized and collapses with "sniff". There is slight invagination of the RA. The respiratory variability of the MV and TV inflows were not evaluated on this echo.         . Moderate pericardial effusion.  4. The aortic valve is normal in structure. Aortic valve regurgitation is not visualized. No aortic stenosis is present. FINDINGS  Left Ventricle: Left ventricular ejection fraction, by estimation, is 55 to 60%. The left ventricle has normal function. Right Ventricle: The right ventricular size is normal. No increase in right ventricular wall thickness. Right ventricular systolic function is normal. Pericardium: There is a small - moderate sized pericardial effusion. The IVC is normal sized and collapses with "sniff". There is slight invagination of the RA. The respiratory variability of the MV and TV inflows were not evaluated on this echo. A moderately sized pericardial effusion is present. Aortic Valve: The aortic valve is normal in structure. Aortic valve regurgitation is not visualized. No aortic stenosis is present. LEFT VENTRICLE PLAX 2D LVIDd:         4.00 cm LVIDs:         2.80 cm LV PW:         1.10 cm LV IVS:        1.20 cm  Mertie Moores MD Electronically signed by Mertie Moores MD Signature Date/Time: 12/12/2020/2:34:25 PM  Final       Scheduled Meds: . amLODipine  2.5 mg Oral Daily  . aspirin EC  81 mg Oral Daily  . timolol  1 drop Both Eyes BID   And  . brimonidine  1 drop Both Eyes BID  . dorzolamide  1 drop Both Eyes BID  . escitalopram  20 mg Oral Daily  . feeding supplement  237 mL Oral BID BM  . ferrous sulfate  325 mg Oral Q breakfast   . fluorometholone  1 drop Right Eye BID  . ketorolac  1 drop Right Eye Daily  . latanoprost  1 drop Both Eyes QHS  . lidocaine      . linaclotide  145 mcg Oral QAC breakfast  . multivitamin with minerals  1 tablet Oral Daily  . Netarsudil Dimesylate  1 drop Both Eyes BID  . potassium chloride  20 mEq Oral Daily  . sodium chloride flush  3 mL Intravenous Q12H  . sodium chloride  1 g Oral TID WC   Continuous Infusions:   LOS: 5 days      Time spent: 60 minutes   Dessa Phi, DO [facility] 12/13/2020, 2:21 PM   Available via Epic secure chat 7am-7pm After these hours, please refer to coverage provider listed on [email or web address]

## 2020-12-13 NOTE — Consult Note (Signed)
Referring Physician: Dessa Phi, DO  Mckenzie [name] is an 75 y.o. female.                       Chief Complaint: Pericardial effusion  HPI: 75 years old white female with PMH of hypertension, hyperlipidemia, chronic anxiety/depression, Chronic anemia, significant hyponatremia, possible SIADH, has increasing left pleural effusion along with small to moderate pericardial effusion without tamponade.  She had thoracentesis this afternoon yielding 300 cc of fluid. Cultures, cytology and Gm stain are pending on the fluid. Her EKG is normal. Chest x-ray post thoracentesis is stable. CT angio chest was positive for pericardial effusion and pleural effusion and emphysema.  Past Medical History:  Diagnosis Date  . Depression   . High cholesterol   . Hypertension   . Retinal micro-aneurysm of right eye       Past Surgical History:  Procedure Laterality Date  . COLONOSCOPY WITH PROPOFOL N/A 12/09/2020   Procedure: COLONOSCOPY WITH PROPOFOL;  Surgeon: Gatha Mayer, MD;  Location: [facility] ENDOSCOPY;  Service: Endoscopy;  Laterality: N/A;  . ESOPHAGOGASTRODUODENOSCOPY (EGD) WITH PROPOFOL N/A 12/09/2020   Procedure: ESOPHAGOGASTRODUODENOSCOPY (EGD) WITH PROPOFOL;  Surgeon: Gatha Mayer, MD;  Location: [place];  Service: Endoscopy;  Laterality: N/A;  . EYE SURGERY    . FINGER SURGERY Left    RING FINGER  . IR THORACENTESIS ASP PLEURAL SPACE W/IMG GUIDE  12/13/2020  . OPEN REDUCTION INTERNAL FIXATION (ORIF) DISTAL RADIAL FRACTURE Left 10/05/2020   Procedure: OPEN REDUCTION INTERNAL FIXATION (ORIF) DISTAL RADIAL FRACTURE;  Surgeon: Shona Needles, MD;  Location: [place];  Service: Orthopedics;  Laterality: Left;  supraclavicular block  . POLYPECTOMY  12/09/2020   Procedure: POLYPECTOMY;  Surgeon: Gatha Mayer, MD;  Location: [facility] ENDOSCOPY;  Service: Endoscopy;;  . TUBAL LIGATION      Family History  Problem Relation Age of Onset  . Hyperlipidemia Mother   . Hypertension Mother   . Diabetes  Mother   . COPD Father   . Depression Sister   . Drug abuse Sister   . Suicidality Brother   . Colon cancer Neg Hx   . Celiac disease Neg Hx   . Inflammatory bowel disease Neg Hx    Social History:  reports that she has quit smoking. Her smoking use included cigarettes. She has never used smokeless tobacco. She reports that she does not drink alcohol and does not use drugs.  Allergies:  Allergies  Allergen Reactions  . Aldomet [Methyldopa] Other (See Comments)    flu  . Fosamax [Alendronate Sodium]   . Acetazolamide Rash  . Buspar [Buspirone] Palpitations    Medications Prior to Admission  Medication Sig Dispense Refill  . COMBIGAN 0.2-0.5 % ophthalmic solution Place 1 drop into both eyes every 12 (twelve) hours.    . dorzolamide (TRUSOPT) 2 % ophthalmic solution Place 1 drop into both eyes 2 (two) times daily.    Marland Kitchen escitalopram (LEXAPRO) 20 MG tablet TAKE 1/2 (ONE-HALF) TABLET BY MOUTH TWICE DAILY (Patient taking differently: Take 20 mg by mouth daily.) 90 tablet 4  . ferrous sulfate 325 (65 FE) MG tablet Take 325 mg by mouth 2 (two) times daily with a meal.    . fluorometholone (FML) 0.1 % ophthalmic suspension Place 1 drop into the right eye 2 (two) times daily.    . hydroxypropyl methylcellulose / hypromellose (ISOPTO TEARS / GONIOVISC) 2.5 % ophthalmic solution Place 1 drop into both eyes 3 (three) times daily as  needed for dry eyes.    Marland Kitchen lubiprostone (AMITIZA) 8 MCG capsule Take one cap with food once or twice daily to maintain regular bowel movements. (Patient taking differently: Take 8 mcg by mouth See admin instructions. Take one cap with food once or twice daily to maintain regular bowel movements.) 60 capsule 2  . PROLENSA 0.07 % SOLN Place 1 drop into the right eye daily.    . Vitamin D, Ergocalciferol, (DRISDOL) 1.25 MG (50000 UNIT) CAPS capsule Take 1 capsule (50,000 Units total) by mouth every 7 (seven) days. (Patient taking differently: Take 50,000 Units by mouth every  Thursday.) 13 capsule 1  . latanoprost (XALATAN) 0.005 % ophthalmic solution Place 1 drop into both eyes at bedtime.    Mckinley Jewel Dimesylate (RHOPRESSA) 0.02 % SOLN Place 1 drop into both eyes in the morning and at bedtime.      Results for orders placed or performed during the hospital encounter of 12/07/20 (from the past 48 hour(s))  Basic metabolic panel     Status: Abnormal   Collection Time: 12/12/20  1:53 AM  Result Value Ref Range   Sodium 126 (L) 135 - 145 mmol/L   Potassium 4.2 3.5 - 5.1 mmol/L    Comment: NO VISIBLE HEMOLYSIS   Chloride 94 (L) 98 - 111 mmol/L   CO2 25 22 - 32 mmol/L   Glucose, Bld 115 (H) 70 - 99 mg/dL    Comment: Glucose reference range applies only to samples taken after fasting for at least 8 hours.   BUN 10 8 - 23 mg/dL   Creatinine, Ser 0.35 (L) 0.44 - 1.00 mg/dL   Calcium 8.0 (L) 8.9 - 10.3 mg/dL   GFR, Estimated >60 >60 mL/min    Comment: (NOTE) Calculated using the CKD-EPI Creatinine Equation (2021)    Anion gap 7 5 - 15    Comment: Performed at [facility] [address] [place], [place] [address]  CBC     Status: Abnormal   Collection Time: 12/12/20  1:53 AM  Result Value Ref Range   WBC 10.7 (H) 4.0 - 10.5 K/uL   RBC 3.41 (L) 3.87 - 5.11 MIL/uL   Hemoglobin 8.6 (L) 12.0 - 15.0 g/dL    Comment: Reticulocyte Hemoglobin testing may be clinically indicated, consider ordering this additional test [id number]    HCT 26.5 (L) 36.0 - 46.0 %   MCV 77.7 (L) 80.0 - 100.0 fL   MCH 25.2 (L) 26.0 - 34.0 pg   MCHC 32.5 30.0 - 36.0 g/dL   RDW 15.9 (H) 11.5 - 15.5 %   Platelets 554 (H) 150 - 400 K/uL   nRBC 0.0 0.0 - 0.2 %    Comment: Performed at [facility], [place] [address], [place], [place] [address]  Magnesium     Status: None   Collection Time: 12/12/20  1:53 AM  Result Value Ref Range   Magnesium 1.8 1.7 - 2.4 mg/dL    Comment: Performed at [facility] [address], [place], [place] [address]  Sodium     Status:  Abnormal   Collection Time: 12/12/20 12:00 PM  Result Value Ref Range   Sodium 127 (L) 135 - 145 mmol/L    Comment: Performed at [facility], [place] [address] [place],  [address]  Hemoglobin and hematocrit, blood     Status: Abnormal   Collection Time: 12/12/20 12:00 PM  Result Value Ref Range   Hemoglobin 9.0 (L) 12.0 -  15.0 g/dL   HCT 28.1 (L) 36.0 - 46.0 %    Comment: Performed at [facility], [place] [address] [place], [place] [address]  Basic metabolic panel     Status: Abnormal   Collection Time: 12/13/20  2:52 AM  Result Value Ref Range   Sodium 127 (L) 135 - 145 mmol/L   Potassium 4.4 3.5 - 5.1 mmol/L   Chloride 95 (L) 98 - 111 mmol/L   CO2 24 22 - 32 mmol/L   Glucose, Bld 125 (H) 70 - 99 mg/dL    Comment: Glucose reference range applies only to samples taken after fasting for at least 8 hours.   BUN 9 8 - 23 mg/dL   Creatinine, Ser 0.37 (L) 0.44 - 1.00 mg/dL   Calcium 8.1 (L) 8.9 - 10.3 mg/dL   GFR, Estimated >60 >60 mL/min    Comment: (NOTE) Calculated using the CKD-EPI Creatinine Equation (2021)    Anion gap 8 5 - 15    Comment: Performed at [facility] [address], [place], [place] [address]  Magnesium     Status: None   Collection Time: 12/13/20  2:52 AM  Result Value Ref Range   Magnesium 1.8 1.7 - 2.4 mg/dL    Comment: Performed at [facility] [address], [place], [place] [address]  Lactate dehydrogenase (pleural or peritoneal fluid)     Status: Abnormal   Collection Time: 12/13/20  2:38 PM  Result Value Ref Range   LD, Fluid 45 (H) 3 - 23 U/L    Comment: (NOTE) Results should be evaluated in conjunction with serum values    Fluid Type-FLDH Lung, Left     Comment: Performed at [facility] [address] [place], [place] [address]  Body fluid cell count with differential     Status: Abnormal   Collection Time: 12/13/20  2:38 PM  Result Value Ref Range   Fluid Type-FCT Lung, Left    Color, Fluid YELLOW     Appearance, Fluid HAZY (A) CLEAR   Total Nucleated Cell Count, Fluid 695 0 - 1,000 cu mm   Neutrophil Count, Fluid 6 0 - 25 %   Lymphs, Fluid 78 %   Monocyte-Macrophage-Serous Fluid 14 (L) 50 - 90 %   Eos, Fluid 2 %    Comment: Performed at [facility], [place] [address] [place] [address], [place], [place] [address]  Protein, pleural or peritoneal fluid     Status: None   Collection Time: 12/13/20  2:38 PM  Result Value Ref Range   Total protein, fluid <3.0 g/dL   Fluid Type-FTP Lung, Left     Comment: Performed at [facility] [address], [place], [place] [address]  Glucose, pleural or peritoneal fluid     Status: None   Collection Time: 12/13/20  2:38 PM  Result Value Ref Range   Glucose, Fluid 148 mg/dL    Comment: (NOTE) No normal range established for this test Results should be evaluated in conjunction with serum values    Fluid Type-FGLU Lung, Left     Comment: Performed at [facility] [address], [place], [place] [address]  Lactate dehydrogenase     Status: None   Collection Time: 12/13/20  3:24 PM  Result Value Ref Range   LDH 109 98 - 192 U/L    Comment: Performed at [facility], [place] [address], [place], [place] [address]  Comprehensive metabolic panel  Status: Abnormal   Collection Time: 12/13/20  3:24 PM  Result Value Ref Range   Sodium 126 (L) 135 - 145 mmol/L   Potassium 4.1 3.5 - 5.1 mmol/L   Chloride 95 (L) 98 - 111 mmol/L   CO2 22 22 - 32 mmol/L   Glucose, Bld 140 (H) 70 - 99 mg/dL    Comment: Glucose reference range applies only to samples taken after fasting for at least 8 hours.   BUN 11 8 - 23 mg/dL   Creatinine, Ser 0.32 (L) 0.44 - 1.00 mg/dL   Calcium 8.1 (L) 8.9 - 10.3 mg/dL   Total Protein 5.9 (L) 6.5 - 8.1 g/dL   Albumin 1.6 (L) 3.5 - 5.0 g/dL   AST 25 15 - 41 U/L   ALT 31 0 - 44 U/L   Alkaline Phosphatase 105 38 - 126 U/L   Total Bilirubin 0.2 (L) 0.3 - 1.2 mg/dL   GFR, Estimated >60 >60 mL/min    Comment: (NOTE) Calculated  using the CKD-EPI Creatinine Equation (2021)    Anion gap 9 5 - 15    Comment: Performed at [facility], [place] [address] [place], [place] [address]  CBC     Status: Abnormal   Collection Time: 12/13/20  3:24 PM  Result Value Ref Range   WBC 9.5 4.0 - 10.5 K/uL   RBC 3.53 (L) 3.87 - 5.11 MIL/uL   Hemoglobin 9.0 (L) 12.0 - 15.0 g/dL   HCT 27.6 (L) 36.0 - 46.0 %   MCV 78.2 (L) 80.0 - 100.0 fL   MCH 25.5 (L) 26.0 - 34.0 pg   MCHC 32.6 30.0 - 36.0 g/dL   RDW 16.8 (H) 11.5 - 15.5 %   Platelets 504 (H) 150 - 400 K/uL   nRBC 0.0 0.0 - 0.2 %    Comment: Performed at [facility] [address], [place], [place] [address]   DG Chest 1 View  Result Date: 12/13/2020 CLINICAL DATA:  Status post left thoracentesis. EXAM: CHEST  1 VIEW COMPARISON:  Chest CT 12/11/2020 FINDINGS: Interval evacuation of the left pleural effusion without postprocedural pneumothorax. Minimal left basilar atelectasis. No right pleural effusion. Stable mild eventration of the right hemidiaphragm. Stable borderline cardiac enlargement. IMPRESSION: Status post left-sided thoracentesis without postprocedural pneumothorax. Electronically Signed   By: Marijo Sanes M.D.   On: 12/13/2020 14:57   ECHOCARDIOGRAM LIMITED  Result Date: 12/12/2020    ECHOCARDIOGRAM LIMITED REPORT   Patient Name:   Mckenzie [name] Date of Exam: 12/12/2020 Medical Rec #:  [id number]         Height:       65.0 in Accession #:    [id number]        Weight:       105.0 lb Date of Birth:  1946-06-03          BSA:          1.504 m Patient Age:    76 years          BP:           166/64 mmHg Patient Gender: F                 HR:           83 bpm. Exam Location:  Inpatient Procedure: Limited Echo Indications:    Pericardial effusion  History:        Patient has prior history of Echocardiogram examinations, most  recent 12/08/2020. Pericardial Disease, Stroke; Risk                 Factors:Hypertension and Dyslipidemia. Pericardial effusion,                  pleural effusion.  Sonographer:    Dustin Flock Referring Phys: [id number] Kayleen Memos  Sonographer Comments: Follow up pericardial effusion from 12/08/20 IMPRESSIONS  1. Left ventricular ejection fraction, by estimation, is 55 to 60%. The left ventricle has normal function.  2. Right ventricular systolic function is normal. The right ventricular size is normal.  3. There is a small - moderate sized pericardial effusion. The IVC is normal sized and collapses with "sniff". There is slight invagination of the RA. The respiratory variability of the MV and TV inflows were not evaluated on this echo.         . Moderate pericardial effusion.  4. The aortic valve is normal in structure. Aortic valve regurgitation is not visualized. No aortic stenosis is present. FINDINGS  Left Ventricle: Left ventricular ejection fraction, by estimation, is 55 to 60%. The left ventricle has normal function. Right Ventricle: The right ventricular size is normal. No increase in right ventricular wall thickness. Right ventricular systolic function is normal. Pericardium: There is a small - moderate sized pericardial effusion. The IVC is normal sized and collapses with "sniff". There is slight invagination of the RA. The respiratory variability of the MV and TV inflows were not evaluated on this echo. A moderately sized pericardial effusion is present. Aortic Valve: The aortic valve is normal in structure. Aortic valve regurgitation is not visualized. No aortic stenosis is present. 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 Interventional radiology asked to perform a  therapeutic and diagnostic thoracentesis. EXAM: ULTRASOUND GUIDED THORACENTESIS MEDICATIONS: 1% lidocaine 10 mL COMPLICATIONS: None immediate. PROCEDURE: An ultrasound guided thoracentesis was thoroughly discussed with the patient and questions answered. The benefits, risks, alternatives and complications were also discussed. The patient understands and wishes to proceed with the procedure. Written consent was obtained. Ultrasound was performed to localize and mark an adequate pocket of fluid in the left chest. The area was then prepped and draped in the normal sterile fashion. 1% Lidocaine was used for local anesthesia. Under ultrasound guidance a 6 Fr Safe-T-Centesis catheter was introduced. Thoracentesis was performed. The catheter was removed and a dressing applied. FINDINGS: A total of approximately 300 mL of clear yellow fluid was removed. Samples were sent to the laboratory as requested by the clinical team. IMPRESSION: Successful ultrasound guided left thoracentesis yielding 300 mL of pleural fluid. Read by: Soyla Dryer, NP Electronically Signed   By: Jerilynn Mages.  Shick M.D.   On: 12/13/2020 15:22    Review Of Systems Constitutional: No fever, chills, Chronic weight loss. Eyes: No vision change, wears glasses. No discharge or pain. Ears: No hearing loss, No tinnitus. Respiratory: No asthma, COPD, pneumonias. Positive shortness of breath. No hemoptysis. Cardiovascular: No chest pain, palpitation, leg edema.  Gastrointestinal: No nausea, vomiting, diarrhea, constipation. No GI bleed. No hepatitis. Genitourinary: No dysuria, hematuria, kidney stone. No incontinance. Neurological: No headache, stroke, seizures.  Psychiatry: No psych facility admission for anxiety, depression, suicide. No detox. Skin: No rash. Musculoskeletal: Positive joint pain, fibromyalgia. No neck pain, back pain. Lymphadenopathy: No lymphadenopathy. Hematology: Positive anemia or easy bruising.   Blood pressure (!) 122/55,  pulse 87, temperature 98.6 F (37 C), temperature source Oral, resp. rate 16, SpO2 97 %. There is no height or weight on file to calculate BMI. General appearance: alert, cooperative, appears stated age and no distress Head: Normocephalic, atraumatic. Eyes: Blue eyes, pale pink conjunctiva, corneas clear.  Neck: No adenopathy, no carotid bruit, no JVD, supple, symmetrical, trachea midline and thyroid not enlarged. Resp: Crackles on left side to auscultation. Cardio: Regular rate and rhythm, S1, S2 normal, II/VI systolic murmur, no click, rub or gallop GI: Soft, non-tender; bowel sounds normal; no organomegaly. Extremities: No edema, cyanosis or clubbing. Skin: Warm and dry.  Neurologic: Alert and oriented X 3, normal strength.   Assessment/Plan Weakness and confusion Severe hyponatremia Possible SIADH Mild to moderate pericardial effusion without tamponade Moderate left pleural effusion, drained HTN Hyperlipidemia Moderate protein calorie malnutrition Emphysema Remote h/o smoking  Pending culture, cytology and gm stain on Pleural fluid treat pericardial fluid with colchicine and indomethacin.  Time spent: Review of old records, Lab, x-rays, EKG, other cardiac tests, examination, discussion with patient/Physician/Family over 70 minutes.  Birdie Riddle, MD  12/13/2020, 6:43 PM

## 2020-12-14 ENCOUNTER — Encounter: Payer: Self-pay | Admitting: Internal Medicine

## 2020-12-14 DIAGNOSIS — E871 Hypo-osmolality and hyponatremia: Secondary | ICD-10-CM | POA: Diagnosis not present

## 2020-12-14 DIAGNOSIS — R531 Weakness: Secondary | ICD-10-CM | POA: Diagnosis not present

## 2020-12-14 LAB — GLUCOSE, CAPILLARY
Glucose-Capillary: 118 mg/dL — ABNORMAL HIGH (ref 70–99)
Glucose-Capillary: 103 mg/dL — ABNORMAL HIGH (ref 70–99)
Glucose-Capillary: 272 mg/dL — ABNORMAL HIGH (ref 70–99)

## 2020-12-14 LAB — BASIC METABOLIC PANEL
Anion gap: 8 (ref 5–15)
BUN: 10 mg/dL (ref 8–23)
CO2: 24 mmol/L (ref 22–32)
Calcium: 8.3 mg/dL — ABNORMAL LOW (ref 8.9–10.3)
Chloride: 96 mmol/L — ABNORMAL LOW (ref 98–111)
Creatinine, Ser: <0.3 mg/dL — ABNORMAL LOW (ref 0.44–1.00)
Glucose, Bld: 113 mg/dL — ABNORMAL HIGH (ref 70–99)
Potassium: 4.5 mmol/L (ref 3.5–5.1)
Sodium: 128 mmol/L — ABNORMAL LOW (ref 135–145)

## 2020-12-14 LAB — HEMOGLOBIN A1C
Hgb A1c MFr Bld: 5.7 % — ABNORMAL HIGH (ref 4.8–5.6)
Mean Plasma Glucose: 116.89 mg/dL

## 2020-12-14 LAB — CBC
HCT: 28.9 % — ABNORMAL LOW (ref 36.0–46.0)
Hemoglobin: 9 g/dL — ABNORMAL LOW (ref 12.0–15.0)
MCH: 24.7 pg — ABNORMAL LOW (ref 26.0–34.0)
MCHC: 31.1 g/dL (ref 30.0–36.0)
MCV: 79.4 fL — ABNORMAL LOW (ref 80.0–100.0)
Platelets: 516 10*3/uL — ABNORMAL HIGH (ref 150–400)
RBC: 3.64 MIL/uL — ABNORMAL LOW (ref 3.87–5.11)
RDW: 16.8 % — ABNORMAL HIGH (ref 11.5–15.5)
WBC: 9.6 10*3/uL (ref 4.0–10.5)
nRBC: 0 % (ref 0.0–0.2)

## 2020-12-14 LAB — PH, BODY FLUID: pH, Body Fluid: 7.8

## 2020-12-14 MED ORDER — PREDNISONE 5 MG/5ML PO SOLN
20.0000 mg | Freq: Every day | ORAL | Status: DC
  Filled 2020-12-14: qty 20

## 2020-12-14 MED ORDER — INSULIN ASPART 100 UNIT/ML ~~LOC~~ SOLN
0.0000 [IU] | Freq: Three times a day (TID) | SUBCUTANEOUS | Status: DC
  Administered 2020-12-15: 3 [IU] via SUBCUTANEOUS
  Administered 2020-12-15: 2 [IU] via SUBCUTANEOUS

## 2020-12-14 MED ORDER — NETARSUDIL DIMESYLATE 0.02 % OP SOLN
1.0000 [drp] | Freq: Every day | OPHTHALMIC | Status: DC
  Administered 2020-12-15 – 2020-12-17 (×3): 1 [drp] via OPHTHALMIC

## 2020-12-14 MED ORDER — PREDNISONE 5 MG/5ML PO SOLN
48.0000 mg | Freq: Every day | ORAL | Status: DC
  Filled 2020-12-14: qty 48

## 2020-12-14 MED ORDER — PREDNISOLONE ACETATE 1 % OP SUSP
1.0000 [drp] | Freq: Every day | OPHTHALMIC | Status: DC
  Administered 2020-12-14 – 2020-12-19 (×6): 1 [drp] via OPHTHALMIC
  Filled 2020-12-14: qty 5

## 2020-12-14 MED ORDER — CALCIUM CARBONATE-VITAMIN D 500-200 MG-UNIT PO TABS
1.0000 | ORAL_TABLET | Freq: Every day | ORAL | Status: DC
  Administered 2020-12-14 – 2020-12-19 (×6): 1 via ORAL
  Filled 2020-12-14 (×6): qty 1

## 2020-12-14 MED ORDER — PREDNISONE 5 MG/5ML PO SOLN: 28.0000 mg | Freq: Every day | ORAL | Status: DC

## 2020-12-14 MED ORDER — PANTOPRAZOLE SODIUM 40 MG PO TBEC
40.0000 mg | DELAYED_RELEASE_TABLET | Freq: Every day | ORAL | Status: DC
  Administered 2020-12-14 – 2020-12-19 (×6): 40 mg via ORAL
  Filled 2020-12-14 (×6): qty 1

## 2020-12-14 MED ORDER — PREDNISONE 5 MG/ML PO CONC
48.0000 mg | Freq: Every day | ORAL | Status: DC
  Administered 2020-12-14 – 2020-12-19 (×6): 48 mg via ORAL
  Filled 2020-12-14 (×6): qty 9.6

## 2020-12-14 NOTE — Progress Notes (Addendum)
Neurology Progress Note Reason for Consult:weakness, gait disturbance Referring Physician:C. 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 Husband is at bedside today. Patient reports that her appetite continues to improve and that she is feeling well after her thoracentesis yesterday. It is discussed with patient that there is low suspicion for malignancy based off of the fluid sample from her transfusion.   Exam: Vitals:   12/13/20 1543 12/14/20 0300  BP: (!) 122/55 (!) 130/56  Pulse: 87 88  Resp: 16 16  Temp:  98.6 F (37 C)  SpO2: 97% 98%   Gen: In bed, NAD Resp: non-labored breathing, no acute distress Abd: soft, nt Skin: Warm and dry, no rash noted  MS:BUE flexors 4+/5, BUE extensors 4+/5 BLE flexors 3-/5, Bilateral dosiflexion 4/5 , Bilateral plantar flexion 4/5 Mental Status: AA&Ox3  Speech/Language: speech is without dysarthria or aphasia. Patient able to hold a conversation and answer questions appropriately.   Cranial Nerves:  II: PERRL. visual fields full.  III, IV, VI: EOMI. Lid elevation symmetric and full.  V: sensation is intact and symmetrical to face.  VII: Smile is symmetrical. Able to raise eyebrows.  VIII:hearing intact to voice IX, X: palate elevation is symmetric. Phonation normal.  XI: normal sternocleidomastoid and trapezius muscle strength TOI:ZTIWPY is symmetrical without fasciculations. Sensory:Intact to light touch bilaterally DTR:3+ throughout   CT ANGIO CHEST AORTA W/CM &/OR WO/CM personally reviewed by attending physician Result Date: 12/11/2020 CLINICAL DATA:  History gyn cell arteritis EXAM: CT ANGIOGRAPHY CHEST WITH CONTRAST TECHNIQUE: Multidetector CT imaging of the chest was performed using the standard protocol during bolus administration of intravenous contrast. Multiplanar CT image reconstructions and MIPs were  obtained to evaluate the vascular anatomy. CONTRAST:  125m OMNIPAQUE IOHEXOL 350 MG/ML SOLN COMPARISON:  12/08/2020, 11/30/2020 CT of the abdomen FINDINGS: Cardiovascular: Initial precontrast images demonstrate atherosclerotic calcifications without aneurysmal dilatation. No hyperdense crescent to suggest acute aortic injury is seen. Post-contrast images demonstrate no aneurysmal dilatation or dissection of the thoracic aorta. Mild wall thickening is noted diffusely throughout the descending thoracic aorta. Heart is enlarged in size. The pulmonary artery shows a normal branching pattern without evidence of intraluminal filling defect to suggest pulmonary embolism. No significant coronary calcifications are noted. Pericardial effusion is noted slightly larger than that seen on prior exam. Mediastinum/Nodes: Thoracic inlet is within normal limits. No sizable hilar or mediastinal adenopathy is noted. The esophagus is within normal limits. Lungs/Pleura: Diffuse emphysematous changes of lungs are noted. Large left-sided pleural effusion is noted which is increased from the prior CT of the chest. Left lower lobe consolidation is noted new from the prior exam. Small right-sided pleural effusion with dependent atelectasis is seen. No sizable parenchymal nodules are noted. Upper Abdomen: Visualized upper abdomen shows no acute abnormality. Musculoskeletal: Degenerative changes of the thoracic spine are noted. No acute bony abnormality is seen. Review of the MIP images confirms the above findings. IMPRESSION: No evidence of pulmonary emboli. Enlarging pericardial effusion. Wall thickening is noted in the descending thoracic aorta. This would correspond with the given clinical history although nonspecific in nature. Enlarging left-sided pleural effusion with underlying left lower lobe consolidation. Aortic Atherosclerosis (ICD10-I70.0) and Emphysema (ICD10-J43.9). Electronically Signed   By: MInez CatalinaM.D.   On: 12/11/2020  18:34   Pertinent Labs:  Protein Electrophoresis T protein 5.9, Albumin ELP 1.8, Alpha 1 Globulin 0.5, Alpha 2 globulin 1.1, B- Globulin 0.9, Gamma globulin  1.6, M spike not observed, w/ SPE pattern demonstrating elevation of regions containing acute phase proteins suggesting an acute/subacute inflammatory response. Some conditions in which this pattern has been observed include: bacterial, viral or parasitic infection; mechanical, physical or chemical trauma; and cardiac failure.   Pleural effusion studies reviewed. Cytology pending but pleural effusion transudative per Light's criteria  Results for Mckenzie, [name] (MRN [id number]) as of 12/14/2020 13:31  Ref. Range 12/13/2020 14:38  Glucose, Fluid Latest Units: mg/dL 148  Fluid Type-FGLU Unknown Lung, Left  Fluid Type-FLDH Unknown Lung, Left  LD, Fluid Latest Ref Range: 3 - 23 U/L 45 (H)  Total protein, fluid Latest Units: g/dL <3.0  Fluid Type-FCT Unknown Lung, Left  Fluid Type-FTP Unknown Lung, Left  Color, Fluid Unknown YELLOW  Total Nucleated Cell Count, Fluid Latest Ref Range: 0 - 1,000 cu mm 695  Lymphs, Fluid Latest Units: % 78  Eos, Fluid Latest Units: % 2  Appearance, Fluid Latest Ref Range: CLEAR  HAZY (A)  Neutrophil Count, Fluid Latest Ref Range: 0 - 25 % 6  Monocyte-Macrophage-Serous Fluid Latest Ref Range: 50 - 90 % 14 (L)   Serum LDH 109 Serum albumin 1.6, total protein 5.9, glucose 140   Impression:  Mckenzie J Websteris a 75 y.o.femaleof weakness, depression, HTN, 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 Patient received thoracentesis yesterday for her pleural effusion and was based on Light criteria appears to be a transudate effusion decreasing suspicion for malignancy. Cardiology has also been following patient and will start indomethacin and colchicine to treat patient's pericardial  effusion. Patient's neck pain has remained stable and patient has not had any symptoms of infection or decompensation in regards to the fluid collection noted on Cervical MRI. Patient today reported a history of cyst.   At this time we feel comfortable starting patient on steroids. Patient will be started on high dose steroids out of concern for GCA. Patient ESR was significantly elevated at 112 and CT A Chest noted thickening of the thoracic aorta wall. Patient denies headache and jaw claudication; however patient has reported to her OP physicians some visual changes. Although the vision disturbances are not common descriptions noted with GCA patient's lab abnormalities in conjunction with her CTA Chest results, would like to get biopsy of patient's temporal artery. Will consult vascular surgery. Patient will be started on high dose steroids today along with Protonix for gastritis ppx, Vit D, and Calcium. At this time will not start patient on PCP ppx as it is not indicated for this patient. Recommendations for ppx are : " glucocorticoid dose equivalent to ?20 mg of prednisone daily for one month or longer who also have another cause of immunocompromise." As patient does not have another immunocompromising disease or medication, will leave PCP ppx at the discretion of rheumatology when patient is seen outpatient and full steroid course is determined.   Patient protein electrophoresis was negative for M spike and gamma- globulin was WNL but was symptomatic of acute/subacute inflammatory process.  Recommendations:  Recommend the following taper of prednisone: - 27m x 2 weeks THEN - 447mx 2 weeks THEN - 35 mg x 2 weeks THEN - 30 mg x 2 weeks  THEN - 25 mg x 2 weeks THEN - 20 mg and hold there pending follow-up with rheumatology; this taper may be modified by rheumatology per patient's clinical course - Vitamin D, Calcium  and protonix while on high dose steroids, may be discontinued if steroids are  stopped (note patient is already on Vitamin D supplementation)  - TID AC + QHS glucose checks a MDSSI while on high dose steroids inpatient. Appreciate primary team determination of glucose management on discharge - F/U cardiology recommendations concerning pericardial effusion - F/U PH, body fluid - F/u Cytology  - Appreciate vascular surgery evaluation for temporal artery biopsy  - Neurology will continue to follow  Damita Dunnings, MD PGY-1  Lesleigh Noe MD-PhD [facility] [phone] Available 7 AM to 7 PM, outside these hours please contact Neurologist on call listed on AMION    Attending Neurologist's note:  I personally saw this patient, gathering history, performing a full neurologic examination, reviewing relevant labs, personally reviewing relevant imaging including CTA chest, and formulated the assessment and plan, adding the note above for completeness and clarity to accurately reflect my thoughts

## 2020-12-14 NOTE — Telephone Encounter (Signed)
Noted  

## 2020-12-14 NOTE — Progress Notes (Signed)
PROGRESS NOTE    Mckenzie [name]  JCS:[id number] DOB: Apr 07, 1946 DOA: 12/07/2020 PCP: Gwenlyn Fudge, FNP     Brief Narrative:  Mckenzie Shan Websteris a 75 y.o.femalewith medical history significant of hypertension, hyperlipidemia, chronic anxiety/depression whopresents after being advised by her primary care provider to come in for low blood counts and low sodium levels. Patient's daughter is present at bedside and provides much of the patient's history. Apparently over the last 3 months patient has been dealing with weakness and confusion. Last hospitalized on 10/05/2020 after having a fall, status post open reduction internal fixation ofa leftdistal radial fracture.Since that hospitalization patient daughter noted that she has had a progressive decline and now needs assistance to complete pretty much all of her ADLs. It seems she was started on Lexapro 2 months prior for adjustment disorder with anxiety and depression. Daughter notes that the medication has been helping and would like to continue this medication. She has been followed by Dr. Harlan Stains of neurology for worsening confusion and weakness. Patient had been worked up for for her symptoms and had been told that she did not have dementia. Also reports patient having EMG studies that were negative. During this time patient sodium and hemoglobin levels have been trending down. No blood in stool, she has been on ferrous sulfate supplements. She had followed up with [facility] and was scheduled to have a colonoscopy on 12/11/20.  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   Her hemoglobin down trended to 7.4, iron studies suggestive of iron deficiency, negative FOBT.  She was seen by GI, she had an EGD and colonoscopy done on 12/09/2020 which findings did not explain her anemia.   Hematology/medical oncology recommended to optimize hemoglobin at least 9.0.  Hospital course complicated by persistent generalized weakness.   Inflammatory markers returned significantly elevated CRP 12.3, ESR 112 on 12/09/2020.   She had a CT cervical spine which revealed a 2 x 1 x 1 cm fluid collection at the C7.  She had an EEG on 12/11/20, no evidence of seizure.  CTA chest done 12/11/2020 revealed enlarging pericardial effusion, no evidence of pulmonary emboli, wall thickening noted in the descending thoracic aorta, enlarging left pleural effusion with underlying left lower lobe consolidation.   New events last 24 hours / Subjective: Patient seen with husband at bedside.  Patient has no specific complaints, denies any chest pain or shortness of breath.  Has not really been out of bed.  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 Algie Coffer.  Agrees with prednisone use, plans to follow-up in 2 weeks.  Started on colchicine, Indocin  Left pleural effusion -Status post thoracentesis 3/9. Technically transudative but pleural protein/serum protein is  very borderline for Light's criteria. Fluid culture and cytology is pending   C7 fluid collection -MR cervical spine: Enhancement and edema about the dorsal tips of the C7  and T1 spinous processes, with a superimposed 2 cm collection adjacent to the C7 spinous process. Findings are nonspecific, but can be seen in the setting of underlying inflammatory conditions including polymyalgia rheumatica. In this setting, the collection could reflect a small bursal collection. Possible infection would be the primary differential consideration, and could be considered in the correct clinical setting  -Patient without significant signs of systemic infection however with normal lactic acid, normal procalcitonin, normal temperature  Proximal muscle weakness -Has been worked up by Dr. Allyson Sabal of neurology as an outpatient.  Question possible PMR diagnosis -Agree with steroids  Anemia of chronic disease -Status post EGD, colonoscopy 3/5 without GI etiology of anemia.  GI has signed off -Appreciate hematology -Goal hemoglobin >9 -Stable   History of bilateral cerebellar CVA -Continue aspirin  Hyperlipidemia -Patient has not tolerated statins in the past  Hypertension -Continue Norvasc  Chronic diastolic heart failure -Without evidence of acute exacerbation at this time  Generalized weakness -PT OT  Severe protein calorie malnutrition -Estimated body mass index is 17.47 kg/m as calculated from the following:   Height as of 12/06/20: _0  (1.651 m).   Weight as of 12/06/20: 47.6 kg.      In agreement with assessment of the pressure ulcer as below:  Pressure Injury 12/08/20 Coccyx Medial Stage 1 -  Intact skin with non-blanchable redness of a localized area usually over a bony prominence. not open,  purple area (Active)  12/08/20 1834  Location: Coccyx  Location Orientation: Medial  Staging: Stage 1 -  Intact skin with non-blanchable redness of a localized area usually over a bony prominence.  Wound Description (Comments): not open,  purple area  Present on Admission: Yes     Nutrition Problem: Moderate Malnutrition Etiology: chronic illness (HTN,  hyperlipidemia)   DVT prophylaxis:  SCDs Start: 12/08/20 0856  Code Status: Full Family Communication: Spouse at bedside Disposition Plan:  Status is: Inpatient  Remains inpatient appropriate because:Ongoing diagnostic testing needed not appropriate for outpatient work up   Dispo:  Patient From: Home  Planned Disposition: Home with Health Care Svc  Medically stable for discharge: No.  Started on steroids today.  PT OT evaluation        Antimicrobials:  Anti-infectives (From admission, onward)   None       Objective: Vitals:   12/13/20 0620 12/13/20 1405 12/13/20 1543 12/14/20 0300  BP: (!) 138/56 (!) 134/52 (!) 122/55 (!) 130/56  Pulse: 87  87 88  Resp:   16 16  Temp: 98.6 F (37 C)   98.6 F (37 C)  TempSrc: Oral   Oral  SpO2: 96%  97% 98%    Intake/Output Summary (Last 24 hours) at 12/14/2020 1138 Last data filed at 12/13/2020 1430 Gross per 24 hour  Intake --  Output 300 ml  Net -300 ml   There were no vitals filed for this visit.  Examination: General exam: Appears calm and comfortable  Respiratory system: Clear to auscultation. Respiratory effort normal.  On room air Cardiovascular system: S1 & S2 heard, RRR. No pedal edema. Gastrointestinal system: Abdomen is nondistended, soft and nontender. Normal bowel sounds heard. Central nervous system: Alert and oriented. Non focal exam. Speech clear  Extremities: Symmetric in appearance bilaterally  Skin: No rashes, lesions or ulcers on exposed skin  Psychiatry: Stable  Data Reviewed: I have personally reviewed following labs and imaging studies  CBC: Recent Labs  Lab 12/10/20 0115 12/11/20 0259 12/12/20 0153 12/12/20 1200 12/13/20 1524 12/14/20 0132  WBC 8.3 10.4 10.7*  --  9.5 9.6  NEUTROABS 6.4  --   --   --   --   --   HGB 9.4* 9.3* 8.6* 9.0* 9.0* 9.0*  HCT 28.9* 28.7* 26.5* 28.1* 27.6* 28.9*  MCV 77.7* 78.4* 77.7*  --  78.2* 79.4*  PLT 530* 580* 554*  --  504* 220*   Basic Metabolic  Panel: Recent Labs  Lab 12/10/20 0115 12/11/20 0243 12/11/20 1411 12/12/20 0153 12/12/20 1200 12/13/20 0252 12/13/20 1524 12/14/20 0132  NA 131* 127* 130* 126* 127* 127* 126* 128*  K 3.4* 3.3* 3.1* 4.2  --  4.4 4.1 4.5  CL 96* 94* 96* 94*  --  95* 95* 96*  CO2 _0 --  _1 GLUCOSE 120* 115* 153* 115*  --  125* 140* 113*  BUN 6* 9 7* 10  --  _2 CREATININE 0.34* 0.37* 0.33* 0.35*  --  0.37* 0.32* <0.30*  CALCIUM 8.4* 8.1* 7.9* 8.0*  --  8.1* 8.1* 8.3*  MG 1.7 1.6* 2.0 1.8  --  1.8  --   --   PHOS 4.0  --   --   --   --   --   --   --    GFR: CrCl cannot be calculated (This lab value cannot be used to calculate CrCl because it is not a number: <0.30). Liver Function Tests: Recent Labs  Lab 12/08/20 1112 12/09/20 0203 12/10/20 0115 12/13/20 1524  AST _3 ALT 50* 46* 33 31  ALKPHOS 115 121 108 105  BILITOT 0.4 0.5 1.2 0.2*  PROT 6.1* 5.9* 5.9* 5.9*  ALBUMIN 1.6* 1.6* 1.6* 1.6*   No results for input(s): LIPASE, AMYLASE in the last 168 hours. No results for input(s): AMMONIA in the last 168 hours. Coagulation Profile: No results for input(s): INR, PROTIME in the last 168 hours. Cardiac Enzymes: Recent Labs  Lab 12/10/20 0543  CKTOTAL <5*   BNP (last 3 results) No results for input(s): PROBNP in the last 8760 hours. HbA1C: Recent Labs    12/14/20 1014  HGBA1C 5.7*   CBG: No results for input(s): GLUCAP in the last 168 hours. Lipid Profile: No results for input(s): CHOL, HDL, LDLCALC, TRIG, CHOLHDL, LDLDIRECT in the last 72 hours. Thyroid Function Tests: No results for input(s): TSH, T4TOTAL, FREET4, T3FREE, THYROIDAB in the last 72 hours. Anemia Panel: No results for input(s): VITAMINB12, FOLATE, FERRITIN, TIBC, IRON, RETICCTPCT in the last 72 hours. Sepsis Labs: Recent Labs  Lab 12/10/20 0543  PROCALCITON <0.10  LATICACIDVEN 0.8    Recent Results (from the past 240 hour(s))  Resp Panel by RT-PCR (Flu A&B, Covid)  Nasopharyngeal Swab     Status: None   Collection Time: 12/08/20  2:34 AM   Specimen: Nasopharyngeal Swab; Nasopharyngeal(NP) swabs in vial transport medium  Result Value Ref Range Status   SARS Coronavirus 2 by RT PCR NEGATIVE NEGATIVE Final    Comment: (NOTE) SARS-CoV-2 target nucleic acids are NOT DETECTED.  The SARS-CoV-2 RNA is generally detectable in upper respiratory specimens during the acute phase of infection. The lowest concentration of SARS-CoV-2 viral copies this assay can detect is 138 copies/mL. A negative result does not preclude SARS-Cov-2 infection and should not be used as the  sole basis for treatment or other patient management decisions. A negative result may occur with  improper specimen collection/handling, submission of specimen other than nasopharyngeal swab, presence of viral mutation(s) within the areas targeted by this assay, and inadequate number of viral copies(<138 copies/mL). A negative result must be combined with clinical observations, patient history, and epidemiological information. The expected result is Negative.  Fact Sheet for Patients:  EntrepreneurPulse.com.au  Fact Sheet for Healthcare Providers:  IncredibleEmployment.be  This test is no t yet approved or cleared by the [place] FDA and  has been authorized for detection and/or diagnosis of SARS-CoV-2 by FDA under an Emergency Use Authorization (EUA). This EUA will remain  in effect (meaning this test can be used) for the duration of the COVID-19 declaration under Section 564(b)(1) of the Act, 21 U.S.C.section 360bbb-3(b)(1), unless the authorization is terminated  or revoked sooner.       Influenza A by PCR NEGATIVE NEGATIVE Final   Influenza B by PCR NEGATIVE NEGATIVE Final    Comment: (NOTE) The Xpert Xpress SARS-CoV-2/FLU/RSV plus assay is intended as an aid in the diagnosis of influenza from Nasopharyngeal swab specimens and should not be  used as a sole basis for treatment. Nasal washings and aspirates are unacceptable for Xpert Xpress SARS-CoV-2/FLU/RSV testing.  Fact Sheet for Patients: EntrepreneurPulse.com.au  Fact Sheet for Healthcare Providers: IncredibleEmployment.be  This test is not yet approved or cleared by the [place] FDA and has been authorized for detection and/or diagnosis of SARS-CoV-2 by FDA under an Emergency Use Authorization (EUA). This EUA will remain in effect (meaning this test can be used) for the duration of the COVID-19 declaration under Section 564(b)(1) of the Act, 21 U.S.C. section 360bbb-3(b)(1), unless the authorization is terminated or revoked.  Performed at [facility], [place] [address], [place], [place] [address]   Gram stain     Status: None   Collection Time: 12/13/20  2:38 PM   Specimen: Fluid  Result Value Ref Range Status   Specimen Description FLUID PLEURAL LEFT  Final   Special Requests NONE  Final   Gram Stain   Final    WBC PRESENT, PREDOMINANTLY MONONUCLEAR NO ORGANISMS SEEN CYTOSPIN SMEAR Performed at [facility], [place] [address], [place], [place] [address]    Report Status 12/13/2020 FINAL  Final      Radiology Studies: DG Chest 1 View  Result Date: 12/13/2020 CLINICAL DATA:  Status post left thoracentesis. EXAM: CHEST  1 VIEW COMPARISON:  Chest CT 12/11/2020 FINDINGS: Interval evacuation of the left pleural effusion without postprocedural pneumothorax. Minimal left basilar atelectasis. No right pleural effusion. Stable mild eventration of the right hemidiaphragm. Stable borderline cardiac enlargement. IMPRESSION: Status post left-sided thoracentesis without postprocedural pneumothorax. Electronically Signed   By: Marijo Sanes M.D.   On: 12/13/2020 14:57   ECHOCARDIOGRAM LIMITED  Result Date: 12/12/2020    ECHOCARDIOGRAM LIMITED REPORT   Patient Name:   Mckenzie [name] Date of Exam: 12/12/2020 Medical Rec #:   [id number]         Height:       65.0 in Accession #:    [id number]        Weight:       105.0 lb Date of Birth:  Apr 04, 1946          BSA:          1.504 m Patient Age:    75 years          BP:  166/64 mmHg Patient Gender: F                 HR:           83 bpm. Exam Location:  Inpatient Procedure: Limited Echo Indications:    Pericardial effusion  History:        Patient has prior history of Echocardiogram examinations, most                 recent 12/08/2020. Pericardial Disease, Stroke; Risk                 Factors:Hypertension and Dyslipidemia. Pericardial effusion,                 pleural effusion.  Sonographer:    Dustin Flock Referring Phys: [id number] Kayleen Memos  Sonographer Comments: Follow up pericardial effusion from 12/08/20 IMPRESSIONS  1. Left ventricular ejection fraction, by estimation, is 55 to 60%. The left ventricle has normal function.  2. Right ventricular systolic function is normal. The right ventricular size is normal.  3. There is a small - moderate sized pericardial effusion. The IVC is normal sized and collapses with "sniff". There is slight invagination of the RA. The respiratory variability of the MV and TV inflows were not evaluated on this echo.         . Moderate pericardial effusion.  4. The aortic valve is normal in structure. Aortic valve regurgitation is not visualized. No aortic stenosis is present. FINDINGS  Left Ventricle: Left ventricular ejection fraction, by estimation, is 55 to 60%. The left ventricle has normal function. Right Ventricle: The right ventricular size is normal. No increase in right ventricular wall thickness. Right ventricular systolic function is normal. Pericardium: There is a small - moderate sized pericardial effusion. The IVC is normal sized and collapses with "sniff". There is slight invagination of the RA. The respiratory variability of the MV and TV inflows were not evaluated on this echo. A moderately sized pericardial effusion is present.  Aortic Valve: The aortic valve is normal in structure. Aortic valve regurgitation is not visualized. No aortic stenosis is present. 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 Interventional radiology asked to perform a therapeutic and diagnostic thoracentesis. EXAM: ULTRASOUND GUIDED THORACENTESIS MEDICATIONS: 1% lidocaine 10 mL COMPLICATIONS: None immediate. PROCEDURE: An ultrasound guided thoracentesis was thoroughly discussed with the patient and questions answered. The benefits, risks, alternatives and complications were also discussed. The patient understands and wishes to proceed with the procedure. Written consent was obtained. Ultrasound was performed to localize and mark an adequate pocket of fluid in the left chest. The area was then prepped and draped in the normal sterile fashion. 1% Lidocaine was used for local anesthesia. Under ultrasound guidance a 6 Fr Safe-T-Centesis catheter was introduced. Thoracentesis was performed. The catheter was removed and a dressing applied. FINDINGS: A total of approximately 300 mL of clear yellow fluid was removed. Samples were sent to the laboratory as requested by the clinical  team. IMPRESSION: Successful ultrasound guided left thoracentesis yielding 300 mL of pleural fluid. Read by: Soyla Dryer, NP Electronically Signed   By: Jerilynn Mages.  Shick M.D.   On: 12/13/2020 15:22      Scheduled Meds: . amLODipine  2.5 mg Oral Daily  . aspirin EC  81 mg Oral Daily  . timolol  1 drop Both Eyes BID   And  . brimonidine  1 drop Both Eyes BID  . calcium-vitamin D  1 tablet Oral Daily  . colchicine  0.6 mg  Oral Daily  . dorzolamide  1 drop Both Eyes BID  . escitalopram  20 mg Oral Daily  . feeding supplement  237 mL Oral BID BM  . ferrous sulfate  325 mg Oral Q breakfast  . fluorometholone  1 drop Right Eye BID  . indomethacin  50 mg Oral BID WC  . insulin aspart  0-15 Units Subcutaneous TID WC  . ketorolac  1 drop Right Eye Daily  . latanoprost  1 drop Both Eyes QHS  . multivitamin with minerals  1 tablet Oral Daily  . [START ON 12/15/2020] Netarsudil Dimesylate  1 drop Both Eyes QHS  . pantoprazole  40 mg Oral Daily  . prednisoLONE acetate  1 drop Left Eye Daily  . predniSONE  48 mg Oral Q breakfast  . sodium chloride flush  3 mL Intravenous Q12H  . sodium chloride  1 g Oral TID WC   Continuous Infusions:   LOS: 6 days      Time spent: 35 minutes   Dessa Phi, DO [facility] 12/14/2020, 11:38 AM   Available via Epic secure chat 7am-7pm After these hours, please refer to coverage provider listed on [email or web address]

## 2020-12-14 NOTE — H&P (View-Only) (Signed)
Hospital Consult    Reason for Consult:  temporal artery biopsy Requesting Physician:  Damita Dunnings, DO MRN #:  [id number]  History of Present Illness: This is a 75 y.o. female with medical history significant for hypertension, hyperlipidemia and chronic anxiety/ depression who presented per recommendation by PCP for further evaluation of anemia and low sodium. Patient has had progressive muscle weakness, fatigue, loss of appetite and  confusion over the past several months. Patients husband present at bedside provides much of her history. He explains that following receiving her COVID 19 vaccines she has not fell well since. He wonders if there is a correlation. Aside from that he explains that she has been seeing PCP and her sodium and hemoglobins have been trending down. She has had full GI workup with no abnormal findings to explain this. There has been some concern for underlying malignancy so she has had various imaging studies.  Cardiology has been evaluating her due to findings of pericardial effusion on CTA chest. MRI showed small fluid collection at her C7 process and CT showed thickened descending aorta concerning for inflammatory conditions. 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 She has been started on Prednisone. She denies any headaches, scalp tenderness, jaw pain, muscle pains, Amaurosis fugax or other visual changes, fever or chills. Per her husband she does have history of macular degeneration and glaucoma but she has had no new visual changes. As stated before she does have significant fatigue, weight loss, and muscle weakness. She recently has needed increased assistance in performing her ADLs.  Vascular surgery has been asked to see her for temporal artery biopsy  Past Medical History:  Diagnosis Date  . Depression   . High cholesterol   . Hypertension   . Retinal micro-aneurysm of right eye      Past Surgical History:  Procedure Laterality Date  . COLONOSCOPY WITH PROPOFOL N/A 12/09/2020   Procedure: COLONOSCOPY WITH PROPOFOL;  Surgeon: Gatha Mayer, MD;  Location: [facility] ENDOSCOPY;  Service: Endoscopy;  Laterality: N/A;  . ESOPHAGOGASTRODUODENOSCOPY (EGD) WITH PROPOFOL N/A 12/09/2020   Procedure: ESOPHAGOGASTRODUODENOSCOPY (EGD) WITH PROPOFOL;  Surgeon: Gatha Mayer, MD;  Location: [place];  Service: Endoscopy;  Laterality: N/A;  . EYE SURGERY    . FINGER SURGERY Left    RING FINGER  . IR THORACENTESIS ASP PLEURAL SPACE W/IMG GUIDE  12/13/2020  . OPEN REDUCTION INTERNAL FIXATION (ORIF) DISTAL RADIAL FRACTURE Left 10/05/2020   Procedure: OPEN REDUCTION INTERNAL FIXATION (ORIF) DISTAL RADIAL FRACTURE;  Surgeon: Shona Needles, MD;  Location: [place];  Service: Orthopedics;  Laterality: Left;  supraclavicular block  . POLYPECTOMY  12/09/2020   Procedure: POLYPECTOMY;  Surgeon: Gatha Mayer, MD;  Location: [facility] ENDOSCOPY;  Service: Endoscopy;;  . TUBAL LIGATION      Allergies  Allergen Reactions  . Aldomet [Methyldopa] Other (See Comments)    flu  . Fosamax [Alendronate Sodium]   . Acetazolamide Rash  . Buspar [Buspirone] Palpitations    Prior to Admission medications   Medication Sig Start Date End Date Taking? Authorizing Provider  COMBIGAN 0.2-0.5 % ophthalmic solution Place 1 drop into both eyes every 12 (twelve) hours. 12/01/13  Yes [provider]  dorzolamide (TRUSOPT) 2 % ophthalmic solution Place 1 drop into both eyes 2 (two) times daily.   Yes [provider]  escitalopram (LEXAPRO) 20 MG tablet TAKE 1/2 (ONE-HALF) TABLET BY MOUTH TWICE DAILY Patient taking differently: Take 20 mg by  mouth daily. 11/16/20  Yes Sater, Nanine Means, MD  ferrous sulfate 325 (65 FE) MG tablet Take 325 mg by mouth 2 (two) times daily with a meal.   Yes [provider]  fluorometholone (FML) 0.1 % ophthalmic suspension Place 1 drop into the right eye 2 (two) times  daily.   Yes [provider]  hydroxypropyl methylcellulose / hypromellose (ISOPTO TEARS / GONIOVISC) 2.5 % ophthalmic solution Place 1 drop into both eyes 3 (three) times daily as needed for dry eyes.   Yes [provider]  lubiprostone (AMITIZA) 8 MCG capsule Take one cap with food once or twice daily to maintain regular bowel movements. Patient taking differently: Take 8 mcg by mouth See admin instructions. Take one cap with food once or twice daily to maintain regular bowel movements. 12/06/20  Yes Mahala Menghini, PA-C  PROLENSA 0.07 % SOLN Place 1 drop into the right eye daily. 12/14/13  Yes [provider]  Vitamin D, Ergocalciferol, (DRISDOL) 1.25 MG (50000 UNIT) CAPS capsule Take 1 capsule (50,000 Units total) by mouth every 7 (seven) days. Patient taking differently: Take 50,000 Units by mouth every Thursday. 11/14/20  Yes Sater, Nanine Means, MD  latanoprost (XALATAN) 0.005 % ophthalmic solution Place 1 drop into both eyes at bedtime. 12/29/15   [provider]  Netarsudil Dimesylate (RHOPRESSA) 0.02 % SOLN Place 1 drop into both eyes in the morning and at bedtime.    [provider]    Social History   Socioeconomic History  . Marital status: Married    Spouse name: Not on file  . Number of children: 3  . Years of education: 53  . Highest education level: Not on file  Occupational History  . Not on file  Tobacco Use  . Smoking status: Former Smoker    Types: Cigarettes  . Smokeless tobacco: Never Used  . Tobacco comment: for a short period  Vaping Use  . Vaping Use: Never used  Substance and Sexual Activity  . Alcohol use: No  . Drug use: No  . Sexual activity: Not on file  Other Topics Concern  . Not on file  Social History Narrative   Right handed   Caffeine use: coffee daily   Social Determinants of Health   Financial Resource Strain: Not on file  Food Insecurity: Not on file  Transportation Needs: Not on file  Physical  Activity: Not on file  Stress: Not on file  Social Connections: Not on file  Intimate Partner Violence: Not on file     Family History  Problem Relation Age of Onset  . Hyperlipidemia Mother   . Hypertension Mother   . Diabetes Mother   . COPD Father   . Depression Sister   . Drug abuse Sister   . Suicidality Brother   . Colon cancer Neg Hx   . Celiac disease Neg Hx   . Inflammatory bowel disease Neg Hx     ROS: Otherwise negative unless mentioned in HPI  Physical Examination  Vitals:   12/13/20 1543 12/14/20 0300  BP: (!) 122/55 (!) 130/56  Pulse: 87 88  Resp: 16 16  Temp:  98.6 F (37 C)  SpO2: 97% 98%   There is no height or weight on file to calculate BMI.  General:  WDWN in NAD Gait: Not observed HENT: WNL, normocephalic Pulmonary: normal non-labored breathing Cardiac: regular rate and rhythm, no carotid bruits Abdomen: flat, soft, NT/ND, no masses Skin: without rashes Vascular Exam/Pulses: 2+ radial  pulses bilaterally, 2+ distal pulses bilaterally Extremities:no muscle atrophy, normal motor and sensation, no edema Musculoskeletal: no muscle wasting or atrophy  Neurologic: A&O X 3;  No focal weakness or paresthesias are detected; speech is fluent/normal Psychiatric:  The pt has Normal affect.  CBC    Component Value Date/Time   WBC 9.6 12/14/2020 0132   RBC 3.64 (L) 12/14/2020 0132   HGB 9.0 (L) 12/14/2020 0132   HGB 8.7 (L) 12/06/2020 1341   HCT 28.9 (L) 12/14/2020 0132   HCT 28.4 (L) 12/06/2020 1341   PLT 516 (H) 12/14/2020 0132   PLT 666 (H) 12/06/2020 1341   MCV 79.4 (L) 12/14/2020 0132   MCV 79 12/06/2020 1341   MCH 24.7 (L) 12/14/2020 0132   MCHC 31.1 12/14/2020 0132   RDW 16.8 (H) 12/14/2020 0132   RDW 14.0 12/06/2020 1341   LYMPHSABS 1.1 12/10/2020 0115   LYMPHSABS 1.1 12/06/2020 1341   MONOABS 0.7 12/10/2020 0115   EOSABS 0.1 12/10/2020 0115   EOSABS 0.1 12/06/2020 1341   BASOSABS 0.0 12/10/2020 0115   BASOSABS 0.0 12/06/2020 1341     BMET    Component Value Date/Time   NA 128 (L) 12/14/2020 0132   NA 126 (L) 12/06/2020 1341   K 4.5 12/14/2020 0132   CL 96 (L) 12/14/2020 0132   CO2 24 12/14/2020 0132   GLUCOSE 113 (H) 12/14/2020 0132   BUN 10 12/14/2020 0132   BUN 10 12/06/2020 1341   CREATININE <0.30 (L) 12/14/2020 0132   CREATININE 0.63 01/23/2018 0803   CALCIUM 8.3 (L) 12/14/2020 0132   GFRNONAA NOT CALCULATED 12/14/2020 0132   GFRNONAA 90 01/23/2018 0803   GFRAA 120 12/01/2020 1127   GFRAA 104 01/23/2018 0803    COAGS: No results found for: INR, PROTIME   Non-Invasive Vascular Imaging:   CT Angio Chest aorta  IMPRESSION: No evidence of pulmonary emboli.  Enlarging pericardial effusion.  Wall thickening is noted in the descending thoracic aorta. This would correspond with the given clinical history although nonspecific in nature.  Enlarging left-sided pleural effusion with underlying left lower lobe consolidation.  MR Cervical Spine w/wo Contrast IMPRESSION: 1. Enhancement and edema about the dorsal tips of the C7 and T1 spinous processes, with a superimposed 2 cm collection adjacent to the C7 spinous process. Findings are nonspecific, but can be seen in the setting of underlying inflammatory conditions including polymyalgia rheumatica. In this setting, the collection could reflect a small bursal collection. Possible infection would be the primary differential consideration, and could be considered in the correct clinical setting. 2. Abnormal flow void within the left vertebral artery, which could be related to slow flow and/or occlusion. 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 3. Mild multilevel cervical spondylosis, most pronounced at C5-6 were there is resultant mild spinal stenosis, with moderate right C6 foraminal narrowing. 4. Normal MRI appearance of the cervical spinal cord. No cord signal changes to suggest myelopathy.   Statin:  No. Beta  Blocker:  No. Aspirin:  Yes.   ACEI:  No. ARB:  No. CCB use:  Yes Other antiplatelets/anticoagulants:  No.    ASSESSMENT/PLAN: This is a 75 y.o. female who presents with worsening weakness, fatigue and confusion over the past several months. 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 CT showed small fluid collection at her C7 process also thickened descending aorta concerning for inflammatory conditions. 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 She has been started on Prednisone. Discussed with patient and her husband recommendation for proceeding with temporal artery biopsy as definitive diagnosis. - Will get her scheduled for temporal artery biopsy tomorrow with Dr. Stanford Breed - She will need to be NPO after midnight - On call vascular surgeon Dr. Stanford Breed will see and evaluate patient later this afternoon to provide further management plans    Karoline Caldwell PA-C [facility] [phone] 12/14/2020  10:18 AM

## 2020-12-14 NOTE — Telephone Encounter (Signed)
Procedures were completed while inpatient in [facility]  Colonoscopy - Three diminutive polyps in the descending colon and in the cecum, removed with a cold snare. Resected and retrieved. - Diverticulosis in the sigmoid colon. - The examination was otherwise normal on direct and retroflexion views. There was some fibrous debrix overlying appendiceal orifice that could not remove - slight limitation of -path with tubular adenoma  EGD -Large hiatal hernia. No Cameron ulcers - Multiple gastric polyps. Diminutive and c/w benign fundic gland polyps - The examination was otherwise normal. No cause of anemia seen - No specimens collected.  Please arrange for follow up in 8 weeks.

## 2020-12-14 NOTE — Progress Notes (Signed)
Nutrition Follow-up  DOCUMENTATION CODES:   Non-severe (moderate) malnutrition in context of chronic illness,Underweight  INTERVENTION:  -Ensure Enlive po TID, each supplement provides 350 kcal and 20 grams of protein -Continue MVI with minerals daily  NUTRITION DIAGNOSIS:   Moderate Malnutrition related to chronic illness (HTN, hyperlipidemia) as evidenced by energy intake < or equal to 75% for > or equal to 1 month,mild fat depletion,moderate fat depletion,mild muscle depletion,moderate muscle depletion.  ongoing  GOAL:   Patient will meet greater than or equal to 90% of their needs  progressing  MONITOR:   PO intake,Supplement acceptance,Labs,Weight trends,Skin,I & O's  REASON FOR ASSESSMENT:   Consult Assessment of nutrition requirement/status  ASSESSMENT:   Mckenzie [name] is a 75 y.o. female with medical history significant of hypertension, hyperlipidemia, anxiety, and depression presents after being advised by her primary care provider to come in for low blood counts and low sodium levels.  Pt admitted with acute on chronic hyponatremia.   3/5- s/p colonoscopy- revealed polyps in descending colon and cecum (resected), diverticulosis in sigmoid colon; s/p upper GI endoscopy- revealed large hiatal hernia, polyps found in fundus and gastric body  3/7- CTA chest revealed enlarging pericardial effusion, no evidence of pulmonary emboli 3/9- s/p US guided L thoracentesis, 332ml yielded, specimen sent for labs  Per Vascular, pt to have bilateral temporal artery biopsy today.   Pt very fatigued at time of RD visit and unable to RD questions at this time. No PO intake documented since last RD assessment. RN unable to provide specifics regarding pt's meal intake over the last few days, but does report pt typically does well with Ensure (ordered BID). Will increase frequency of supplementation.   Medications: oscal with D, ferrous sulfate, ss novolog TID w/ meals, mvi,  protonix, pred forte, prednisone, sodium chloride Labs: Na 130 (L) CBGs 154-272  Diet Order:   Diet Order            Diet NPO time specified  Diet effective midnight                 EDUCATION NEEDS:   Education needs have been addressed  Skin:  Skin Assessment: Skin Integrity Issues: Skin Integrity Issues:: Stage I Stage I: coccyx  Last BM:  3/10 per pt's husband  Height:   Ht Readings from Last 1 Encounters:  12/06/20 5\' 5"  (1.651 m)    Weight:   Wt Readings from Last 1 Encounters:  12/06/20 47.6 kg    Ideal Body Weight:  56.8 kg  BMI:  There is no height or weight on file to calculate BMI.  Estimated Nutritional Needs:   Kcal:  1700-1900  Protein:  95-110 grams  Fluid:  > 1.7 L    Larkin Ina, MS, RD, LDN RD pager number and weekend/on-call pager number located in [place].

## 2020-12-14 NOTE — Consult Note (Signed)
Ref: Loman Brooklyn, FNP   Subjective:  Awake. VS stable.  Patient under work up for possible GC arteritis. She will need high dose prednisone to start. Her inflammatory markers are very high and Ct chest shows wall thickening of descending aorta. Sodium level marginally improving.  Objective:  Vital Signs in the last 24 hours: Temp:  [98.6 F (37 C)] 98.6 F (37 C) (03/10 0300) Pulse Rate:  [87-88] 88 (03/10 0300) Cardiac Rhythm: Normal sinus rhythm (03/10 0700) Resp:  [16] 16 (03/10 0300) BP: (122-134)/(52-56) 130/56 (03/10 0300) SpO2:  [97 %-98 %] 98 % (03/10 0300)  Physical Exam: BP Readings from Last 1 Encounters:  12/14/20 (!) 130/56     Wt Readings from Last 1 Encounters:  12/06/20 47.6 kg    Weight change:  There is no height or weight on file to calculate BMI. HEENT: [place]/AT, Eyes-Blue, Conjunctiva-Pink, Sclera-Non-icteric Neck: No JVD, No bruit, Trachea midline. Lungs:  Clearing, Bilateral. Cardiac:  Regular rhythm, normal S1 and S2, no S3. II/VI systolic murmur. Positive rub in supine position. Abdomen:  Soft, non-tender. BS present. Extremities:  No edema present. No cyanosis. No clubbing. CNS: AxOx3, Cranial nerves grossly intact, moves all 4 extremities.  Skin: Warm and dry.   Intake/Output from previous day: 03/09 0701 - 03/10 0700 In: -  Out: 300     Lab Results: BMET    Component Value Date/Time   NA 128 (L) 12/14/2020 0132   NA 126 (L) 12/13/2020 1524   NA 127 (L) 12/13/2020 0252   NA 126 (L) 12/06/2020 1341   NA 129 (L) 12/01/2020 1127   NA 129 (L) 11/29/2020 1206   K 4.5 12/14/2020 0132   K 4.1 12/13/2020 1524   K 4.4 12/13/2020 0252   CL 96 (L) 12/14/2020 0132   CL 95 (L) 12/13/2020 1524   CL 95 (L) 12/13/2020 0252   CO2 24 12/14/2020 0132   CO2 22 12/13/2020 1524   CO2 24 12/13/2020 0252   GLUCOSE 113 (H) 12/14/2020 0132   GLUCOSE 140 (H) 12/13/2020 1524   GLUCOSE 125 (H) 12/13/2020 0252   BUN 10 12/14/2020 0132   BUN 11  12/13/2020 1524   BUN 9 12/13/2020 0252   BUN 10 12/06/2020 1341   BUN 8 12/01/2020 1127   BUN 8 11/29/2020 1206   CREATININE <0.30 (L) 12/14/2020 0132   CREATININE 0.32 (L) 12/13/2020 1524   CREATININE 0.37 (L) 12/13/2020 0252   CREATININE 0.63 01/23/2018 0803   CREATININE 0.63 07/16/2017 0913   CREATININE 0.63 01/07/2017 0800   CALCIUM 8.3 (L) 12/14/2020 0132   CALCIUM 8.1 (L) 12/13/2020 1524   CALCIUM 8.1 (L) 12/13/2020 0252   GFRNONAA NOT CALCULATED 12/14/2020 0132   GFRNONAA >60 12/13/2020 1524   GFRNONAA >60 12/13/2020 0252   GFRNONAA 90 01/23/2018 0803   GFRNONAA 90 07/16/2017 0913   GFRNONAA >89 06/27/2016 1704   GFRAA 120 12/01/2020 1127   GFRAA 120 11/29/2020 1206   GFRAA 121 11/21/2020 1641   GFRAA 104 01/23/2018 0803   GFRAA 104 07/16/2017 0913   GFRAA >89 06/27/2016 1704   CBC    Component Value Date/Time   WBC 9.6 12/14/2020 0132   RBC 3.64 (L) 12/14/2020 0132   HGB 9.0 (L) 12/14/2020 0132   HGB 8.7 (L) 12/06/2020 1341   HCT 28.9 (L) 12/14/2020 0132   HCT 28.4 (L) 12/06/2020 1341   PLT 516 (H) 12/14/2020 0132   PLT 666 (H) 12/06/2020 1341   MCV 79.4 (L)  12/14/2020 0132   MCV 79 12/06/2020 1341   MCH 24.7 (L) 12/14/2020 0132   MCHC 31.1 12/14/2020 0132   RDW 16.8 (H) 12/14/2020 0132   RDW 14.0 12/06/2020 1341   LYMPHSABS 1.1 12/10/2020 0115   LYMPHSABS 1.1 12/06/2020 1341   MONOABS 0.7 12/10/2020 0115   EOSABS 0.1 12/10/2020 0115   EOSABS 0.1 12/06/2020 1341   BASOSABS 0.0 12/10/2020 0115   BASOSABS 0.0 12/06/2020 1341   HEPATIC Function Panel Recent Labs    12/09/20 0203 12/10/20 0115 12/13/20 1524  PROT 5.9* 5.9* 5.9*   HEMOGLOBIN A1C No components found for: HGA1C,  MPG CARDIAC ENZYMES Lab Results  Component Value Date   CKTOTAL <5 (L) 12/10/2020   BNP No results for input(s): PROBNP in the last 8760 hours. TSH Recent Labs    09/26/20 1548  TSH 2.100   CHOLESTEROL Recent Labs    01/04/20 0852 12/11/20 0243  CHOL 151 127     Scheduled Meds: . amLODipine  2.5 mg Oral Daily  . aspirin EC  81 mg Oral Daily  . timolol  1 drop Both Eyes BID   And  . brimonidine  1 drop Both Eyes BID  . calcium citrate-vitamin D  1 tablet Oral Daily  . colchicine  0.6 mg Oral Daily  . dorzolamide  1 drop Both Eyes BID  . escitalopram  20 mg Oral Daily  . feeding supplement  237 mL Oral BID BM  . ferrous sulfate  325 mg Oral Q breakfast  . fluorometholone  1 drop Right Eye BID  . indomethacin  50 mg Oral BID WC  . ketorolac  1 drop Right Eye Daily  . latanoprost  1 drop Both Eyes QHS  . multivitamin with minerals  1 tablet Oral Daily  . Netarsudil Dimesylate  1 drop Both Eyes BID  . pantoprazole  40 mg Oral Daily  . predniSONE  20 mg Oral Q lunch  . [START ON 12/15/2020] predniSONE  28 mg Oral Q breakfast  . sodium chloride flush  3 mL Intravenous Q12H  . sodium chloride  1 g Oral TID WC   Continuous Infusions: PRN Meds:.acetaminophen **OR** acetaminophen, hydrALAZINE, ondansetron **OR** ondansetron (ZOFRAN) IV, polyvinyl alcohol  Assessment/Plan: Weakness and confusion Severe hyponatremia Possible SIADH Mild to moderate pericardial effusion without tamponade Left pleural effusion, drained HTN HLD Emphysema Moderate protein calorie malnutrition 2nd hand cigarette smoke exposure   Agree with prednisone use. May not need other antiinflammatory too long. F/U in 2 weeks.   LOS: 6 days   Time spent including chart review, lab review, examination, discussion with patient/Family and doctor : 30 min   Dixie Dials  MD  12/14/2020, 9:51 AM

## 2020-12-14 NOTE — Consult Note (Signed)
Hospital Consult    Reason for Consult:  temporal artery biopsy Requesting Physician:  Damita Dunnings, DO MRN #:  [id number]  History of Present Illness: This is a 75 y.o. female with medical history significant for hypertension, hyperlipidemia and chronic anxiety/ depression who presented per recommendation by PCP for further evaluation of anemia and low sodium. Patient has had progressive muscle weakness, fatigue, loss of appetite and  confusion over the past several months. Patients husband present at bedside provides much of her history. He explains that following receiving her COVID 19 vaccines she has not fell well since. He wonders if there is a correlation. Aside from that he explains that she has been seeing PCP and her sodium and hemoglobins have been trending down. She has had full GI workup with no abnormal findings to explain this. There has been some concern for underlying malignancy so she has had various imaging studies.  Cardiology has been evaluating her due to findings of pericardial effusion on CTA chest. MRI showed small fluid collection at her C7 process and CT showed thickened descending aorta concerning for inflammatory conditions. 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 She has been started on Prednisone. She denies any headaches, scalp tenderness, jaw pain, muscle pains, Amaurosis fugax or other visual changes, fever or chills. Per her husband she does have history of macular degeneration and glaucoma but she has had no new visual changes. As stated before she does have significant fatigue, weight loss, and muscle weakness. She recently has needed increased assistance in performing her ADLs.  Vascular surgery has been asked to see her for temporal artery biopsy  Past Medical History:  Diagnosis Date  . Depression   . High cholesterol   . Hypertension   . Retinal micro-aneurysm of right eye      Past Surgical History:  Procedure Laterality Date  . COLONOSCOPY WITH PROPOFOL N/A 12/09/2020   Procedure: COLONOSCOPY WITH PROPOFOL;  Surgeon: Gatha Mayer, MD;  Location: [facility] ENDOSCOPY;  Service: Endoscopy;  Laterality: N/A;  . ESOPHAGOGASTRODUODENOSCOPY (EGD) WITH PROPOFOL N/A 12/09/2020   Procedure: ESOPHAGOGASTRODUODENOSCOPY (EGD) WITH PROPOFOL;  Surgeon: Gatha Mayer, MD;  Location: [place];  Service: Endoscopy;  Laterality: N/A;  . EYE SURGERY    . FINGER SURGERY Left    RING FINGER  . IR THORACENTESIS ASP PLEURAL SPACE W/IMG GUIDE  12/13/2020  . OPEN REDUCTION INTERNAL FIXATION (ORIF) DISTAL RADIAL FRACTURE Left 10/05/2020   Procedure: OPEN REDUCTION INTERNAL FIXATION (ORIF) DISTAL RADIAL FRACTURE;  Surgeon: Shona Needles, MD;  Location: [place];  Service: Orthopedics;  Laterality: Left;  supraclavicular block  . POLYPECTOMY  12/09/2020   Procedure: POLYPECTOMY;  Surgeon: Gatha Mayer, MD;  Location: [facility] ENDOSCOPY;  Service: Endoscopy;;  . TUBAL LIGATION      Allergies  Allergen Reactions  . Aldomet [Methyldopa] Other (See Comments)    flu  . Fosamax [Alendronate Sodium]   . Acetazolamide Rash  . Buspar [Buspirone] Palpitations    Prior to Admission medications   Medication Sig Start Date End Date Taking? Authorizing Provider  COMBIGAN 0.2-0.5 % ophthalmic solution Place 1 drop into both eyes every 12 (twelve) hours. 12/01/13  Yes [provider]  dorzolamide (TRUSOPT) 2 % ophthalmic solution Place 1 drop into both eyes 2 (two) times daily.   Yes [provider]  escitalopram (LEXAPRO) 20 MG tablet TAKE 1/2 (ONE-HALF) TABLET BY MOUTH TWICE DAILY Patient taking differently: Take 20 mg by  mouth daily. 11/16/20  Yes Sater, Nanine Means, MD  ferrous sulfate 325 (65 FE) MG tablet Take 325 mg by mouth 2 (two) times daily with a meal.   Yes [provider]  fluorometholone (FML) 0.1 % ophthalmic suspension Place 1 drop into the right eye 2 (two) times  daily.   Yes [provider]  hydroxypropyl methylcellulose / hypromellose (ISOPTO TEARS / GONIOVISC) 2.5 % ophthalmic solution Place 1 drop into both eyes 3 (three) times daily as needed for dry eyes.   Yes [provider]  lubiprostone (AMITIZA) 8 MCG capsule Take one cap with food once or twice daily to maintain regular bowel movements. Patient taking differently: Take 8 mcg by mouth See admin instructions. Take one cap with food once or twice daily to maintain regular bowel movements. 12/06/20  Yes Mahala Menghini, PA-C  PROLENSA 0.07 % SOLN Place 1 drop into the right eye daily. 12/14/13  Yes [provider]  Vitamin D, Ergocalciferol, (DRISDOL) 1.25 MG (50000 UNIT) CAPS capsule Take 1 capsule (50,000 Units total) by mouth every 7 (seven) days. Patient taking differently: Take 50,000 Units by mouth every Thursday. 11/14/20  Yes Sater, Nanine Means, MD  latanoprost (XALATAN) 0.005 % ophthalmic solution Place 1 drop into both eyes at bedtime. 12/29/15   [provider]  Netarsudil Dimesylate (RHOPRESSA) 0.02 % SOLN Place 1 drop into both eyes in the morning and at bedtime.    [provider]    Social History   Socioeconomic History  . Marital status: Married    Spouse name: Not on file  . Number of children: 3  . Years of education: 56  . Highest education level: Not on file  Occupational History  . Not on file  Tobacco Use  . Smoking status: Former Smoker    Types: Cigarettes  . Smokeless tobacco: Never Used  . Tobacco comment: for a short period  Vaping Use  . Vaping Use: Never used  Substance and Sexual Activity  . Alcohol use: No  . Drug use: No  . Sexual activity: Not on file  Other Topics Concern  . Not on file  Social History Narrative   Right handed   Caffeine use: coffee daily   Social Determinants of Health   Financial Resource Strain: Not on file  Food Insecurity: Not on file  Transportation Needs: Not on file  Physical  Activity: Not on file  Stress: Not on file  Social Connections: Not on file  Intimate Partner Violence: Not on file     Family History  Problem Relation Age of Onset  . Hyperlipidemia Mother   . Hypertension Mother   . Diabetes Mother   . COPD Father   . Depression Sister   . Drug abuse Sister   . Suicidality Brother   . Colon cancer Neg Hx   . Celiac disease Neg Hx   . Inflammatory bowel disease Neg Hx     ROS: Otherwise negative unless mentioned in HPI  Physical Examination  Vitals:   12/13/20 1543 12/14/20 0300  BP: (!) 122/55 (!) 130/56  Pulse: 87 88  Resp: 16 16  Temp:  98.6 F (37 C)  SpO2: 97% 98%   There is no height or weight on file to calculate BMI.  General:  WDWN in NAD Gait: Not observed HENT: WNL, normocephalic Pulmonary: normal non-labored breathing Cardiac: regular rate and rhythm, no carotid bruits Abdomen: flat, soft, NT/ND, no masses Skin: without rashes Vascular Exam/Pulses: 2+ radial  pulses bilaterally, 2+ distal pulses bilaterally Extremities:no muscle atrophy, normal motor and sensation, no edema Musculoskeletal: no muscle wasting or atrophy  Neurologic: A&O X 3;  No focal weakness or paresthesias are detected; speech is fluent/normal Psychiatric:  The pt has Normal affect.  CBC    Component Value Date/Time   WBC 9.6 12/14/2020 0132   RBC 3.64 (L) 12/14/2020 0132   HGB 9.0 (L) 12/14/2020 0132   HGB 8.7 (L) 12/06/2020 1341   HCT 28.9 (L) 12/14/2020 0132   HCT 28.4 (L) 12/06/2020 1341   PLT 516 (H) 12/14/2020 0132   PLT 666 (H) 12/06/2020 1341   MCV 79.4 (L) 12/14/2020 0132   MCV 79 12/06/2020 1341   MCH 24.7 (L) 12/14/2020 0132   MCHC 31.1 12/14/2020 0132   RDW 16.8 (H) 12/14/2020 0132   RDW 14.0 12/06/2020 1341   LYMPHSABS 1.1 12/10/2020 0115   LYMPHSABS 1.1 12/06/2020 1341   MONOABS 0.7 12/10/2020 0115   EOSABS 0.1 12/10/2020 0115   EOSABS 0.1 12/06/2020 1341   BASOSABS 0.0 12/10/2020 0115   BASOSABS 0.0 12/06/2020 1341     BMET    Component Value Date/Time   NA 128 (L) 12/14/2020 0132   NA 126 (L) 12/06/2020 1341   K 4.5 12/14/2020 0132   CL 96 (L) 12/14/2020 0132   CO2 24 12/14/2020 0132   GLUCOSE 113 (H) 12/14/2020 0132   BUN 10 12/14/2020 0132   BUN 10 12/06/2020 1341   CREATININE <0.30 (L) 12/14/2020 0132   CREATININE 0.63 01/23/2018 0803   CALCIUM 8.3 (L) 12/14/2020 0132   GFRNONAA NOT CALCULATED 12/14/2020 0132   GFRNONAA 90 01/23/2018 0803   GFRAA 120 12/01/2020 1127   GFRAA 104 01/23/2018 0803    COAGS: No results found for: INR, PROTIME   Non-Invasive Vascular Imaging:   CT Angio Chest aorta  IMPRESSION: No evidence of pulmonary emboli.  Enlarging pericardial effusion.  Wall thickening is noted in the descending thoracic aorta. This would correspond with the given clinical history although nonspecific in nature.  Enlarging left-sided pleural effusion with underlying left lower lobe consolidation.  MR Cervical Spine w/wo Contrast IMPRESSION: 1. Enhancement and edema about the dorsal tips of the C7 and T1 spinous processes, with a superimposed 2 cm collection adjacent to the C7 spinous process. Findings are nonspecific, but can be seen in the setting of underlying inflammatory conditions including polymyalgia rheumatica. In this setting, the collection could reflect a small bursal collection. Possible infection would be the primary differential consideration, and could be considered in the correct clinical setting. 2. Abnormal flow void within the left vertebral artery, which could be related to slow flow and/or occlusion. 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 3. Mild multilevel cervical spondylosis, most pronounced at C5-6 were there is resultant mild spinal stenosis, with moderate right C6 foraminal narrowing. 4. Normal MRI appearance of the cervical spinal cord. No cord signal changes to suggest myelopathy.   Statin:  No. Beta  Blocker:  No. Aspirin:  Yes.   ACEI:  No. ARB:  No. CCB use:  Yes Other antiplatelets/anticoagulants:  No.    ASSESSMENT/PLAN: This is a 75 y.o. female who presents with worsening weakness, fatigue and confusion over the past several months. 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 CT showed small fluid collection at her C7 process also thickened descending aorta concerning for inflammatory conditions. 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 She has been started on Prednisone. Discussed with patient and her husband recommendation for proceeding with temporal artery biopsy as definitive diagnosis. - Will get her scheduled for temporal artery biopsy tomorrow with Dr. Stanford Breed - She will need to be NPO after midnight - On call vascular surgeon Dr. Stanford Breed will see and evaluate patient later this afternoon to provide further management plans    Karoline Caldwell PA-C [facility] [phone] 12/14/2020  10:18 AM

## 2020-12-15 ENCOUNTER — Inpatient Hospital Stay (HOSPITAL_COMMUNITY): Payer: Medicare Other | Admitting: Certified Registered"

## 2020-12-15 ENCOUNTER — Inpatient Hospital Stay (HOSPITAL_COMMUNITY): Payer: Medicare Other

## 2020-12-15 ENCOUNTER — Encounter (HOSPITAL_COMMUNITY)
Admission: EM | Disposition: A | Discharge status: Home Health | Payer: Self-pay | Source: Home / Self Care | Attending: Internal Medicine

## 2020-12-15 ENCOUNTER — Other Ambulatory Visit: Payer: Self-pay

## 2020-12-15 DIAGNOSIS — M316 Other giant cell arteritis: Secondary | ICD-10-CM

## 2020-12-15 DIAGNOSIS — E871 Hypo-osmolality and hyponatremia: Secondary | ICD-10-CM | POA: Diagnosis not present

## 2020-12-15 DIAGNOSIS — R531 Weakness: Secondary | ICD-10-CM | POA: Diagnosis not present

## 2020-12-15 HISTORY — PX: ARTERY BIOPSY: SHX891

## 2020-12-15 LAB — BASIC METABOLIC PANEL
Anion gap: 9 (ref 5–15)
BUN: 18 mg/dL (ref 8–23)
CO2: 27 mmol/L (ref 22–32)
Calcium: 9.2 mg/dL (ref 8.9–10.3)
Chloride: 94 mmol/L — ABNORMAL LOW (ref 98–111)
Creatinine, Ser: 0.33 mg/dL — ABNORMAL LOW (ref 0.44–1.00)
GFR, Estimated: >60 mL/min (ref >60)
Glucose, Bld: 274 mg/dL — ABNORMAL HIGH (ref 70–99)
Potassium: 4.1 mmol/L (ref 3.5–5.1)
Sodium: 130 mmol/L — ABNORMAL LOW (ref 135–145)

## 2020-12-15 LAB — CBC
HCT: 30.7 % — ABNORMAL LOW (ref 36.0–46.0)
Hemoglobin: 9.8 g/dL — ABNORMAL LOW (ref 12.0–15.0)
MCH: 24.7 pg — ABNORMAL LOW (ref 26.0–34.0)
MCHC: 31.9 g/dL (ref 30.0–36.0)
MCV: 77.5 fL — ABNORMAL LOW (ref 80.0–100.0)
Platelets: 560 10*3/uL — ABNORMAL HIGH (ref 150–400)
RBC: 3.96 MIL/uL (ref 3.87–5.11)
RDW: 16.4 % — ABNORMAL HIGH (ref 11.5–15.5)
WBC: 5.9 10*3/uL (ref 4.0–10.5)
nRBC: 0 % (ref 0.0–0.2)

## 2020-12-15 LAB — GLUCOSE, CAPILLARY
Glucose-Capillary: 154 mg/dL — ABNORMAL HIGH (ref 70–99)
Glucose-Capillary: 138 mg/dL — ABNORMAL HIGH (ref 70–99)
Glucose-Capillary: 346 mg/dL — ABNORMAL HIGH (ref 70–99)
Glucose-Capillary: 268 mg/dL — ABNORMAL HIGH (ref 70–99)

## 2020-12-15 LAB — CYTOLOGY - NON PAP

## 2020-12-15 SURGERY — BIOPSY TEMPORAL ARTERY
Anesthesia: General | Site: Head | Laterality: Bilateral

## 2020-12-15 MED ORDER — DOUBLE ANTIBIOTIC 500-10000 UNIT/GM EX OINT
TOPICAL_OINTMENT | CUTANEOUS | Status: AC
  Filled 2020-12-15: qty 28.4

## 2020-12-15 MED ORDER — CHLORHEXIDINE GLUCONATE 0.12 % MT SOLN
OROMUCOSAL | Status: AC
  Administered 2020-12-15: 15 mL
  Filled 2020-12-15: qty 15

## 2020-12-15 MED ORDER — INSULIN ASPART 100 UNIT/ML ~~LOC~~ SOLN: 0.0000 [IU] | Freq: Three times a day (TID) | SUBCUTANEOUS | Status: DC

## 2020-12-15 MED ORDER — FENTANYL CITRATE (PF) 250 MCG/5ML IJ SOLN
INTRAMUSCULAR | Status: AC
  Filled 2020-12-15: qty 5

## 2020-12-15 MED ORDER — PHENYLEPHRINE HCL-NACL 10-0.9 MG/250ML-% IV SOLN
INTRAVENOUS | Status: DC | PRN
  Administered 2020-12-15: 25 ug/min via INTRAVENOUS

## 2020-12-15 MED ORDER — ENSURE ENLIVE PO LIQD
237.0000 mL | Freq: Three times a day (TID) | ORAL | Status: DC
  Administered 2020-12-15 – 2020-12-19 (×8): 237 mL via ORAL

## 2020-12-15 MED ORDER — CEFAZOLIN SODIUM-DEXTROSE 2-3 GM-%(50ML) IV SOLR
INTRAVENOUS | Status: DC | PRN
  Administered 2020-12-15: 2 g via INTRAVENOUS

## 2020-12-15 MED ORDER — PROPOFOL 10 MG/ML IV BOLUS
INTRAVENOUS | Status: DC | PRN
  Administered 2020-12-15: 100 mg via INTRAVENOUS

## 2020-12-15 MED ORDER — PHENYLEPHRINE HCL (PRESSORS) 10 MG/ML IV SOLN
INTRAVENOUS | Status: DC | PRN
  Administered 2020-12-15: 40 ug via INTRAVENOUS

## 2020-12-15 MED ORDER — LIDOCAINE HCL (CARDIAC) PF 100 MG/5ML IV SOSY
PREFILLED_SYRINGE | INTRAVENOUS | Status: DC | PRN
  Administered 2020-12-15: 60 mg via INTRAVENOUS

## 2020-12-15 MED ORDER — INSULIN ASPART 100 UNIT/ML ~~LOC~~ SOLN
0.0000 [IU] | Freq: Three times a day (TID) | SUBCUTANEOUS | Status: DC
  Administered 2020-12-15: 8 [IU] via SUBCUTANEOUS

## 2020-12-15 MED ORDER — ACETAMINOPHEN 500 MG PO TABS
1000.0000 mg | ORAL_TABLET | Freq: Once | ORAL | Status: AC
  Administered 2020-12-15: 1000 mg via ORAL
  Filled 2020-12-15: qty 2

## 2020-12-15 MED ORDER — DOUBLE ANTIBIOTIC 500-10000 UNIT/GM EX OINT
TOPICAL_OINTMENT | CUTANEOUS | Status: DC | PRN
  Administered 2020-12-15: 1 via TOPICAL

## 2020-12-15 MED ORDER — ONDANSETRON HCL 4 MG/2ML IJ SOLN
INTRAMUSCULAR | Status: DC | PRN
  Administered 2020-12-15: 4 mg via INTRAVENOUS

## 2020-12-15 MED ORDER — 0.9 % SODIUM CHLORIDE (POUR BTL) OPTIME
TOPICAL | Status: DC | PRN
  Administered 2020-12-15: 1000 mL

## 2020-12-15 MED ORDER — FENTANYL CITRATE (PF) 250 MCG/5ML IJ SOLN
INTRAMUSCULAR | Status: DC | PRN
  Administered 2020-12-15 (×3): 25 ug via INTRAVENOUS

## 2020-12-15 MED ORDER — LACTATED RINGERS IV SOLN: INTRAVENOUS | Status: DC | PRN

## 2020-12-15 SURGICAL SUPPLY — 45 items
ADH SKN CLS APL DERMABOND .7 (GAUZE/BANDAGES/DRESSINGS) ×2
APL PRP STRL LF DISP 70% ISPRP (MISCELLANEOUS) ×1
BALL CTTN LRG ABS STRL LF (GAUZE/BANDAGES/DRESSINGS) ×1
CANISTER SUCT 3000ML PPV (MISCELLANEOUS) ×2 IMPLANT
CHLORAPREP W/TINT 26 (MISCELLANEOUS) ×2 IMPLANT
CLIP VESOCCLUDE SM WIDE 6/CT (CLIP) ×2 IMPLANT
CNTNR URN SCR LID CUP LEK RST (MISCELLANEOUS) ×1 IMPLANT
CONT SPEC 4OZ STRL OR WHT (MISCELLANEOUS) ×2
COTTONBALL LRG STERILE PKG (GAUZE/BANDAGES/DRESSINGS) ×2 IMPLANT
COVER PROBE W GEL 5X96 (DRAPES) ×1 IMPLANT
COVER SURGICAL LIGHT HANDLE (MISCELLANEOUS) ×2 IMPLANT
COVER WAND RF STERILE (DRAPES) ×2 IMPLANT
DERMABOND ADVANCED (GAUZE/BANDAGES/DRESSINGS) ×2
DERMABOND ADVANCED .7 DNX12 (GAUZE/BANDAGES/DRESSINGS) ×1 IMPLANT
DRAPE OPHTHALMIC 77X100 STRL (CUSTOM PROCEDURE TRAY) ×3 IMPLANT
ELECT NDL BLADE 2-5/6 (NEEDLE) ×1 IMPLANT
ELECT NEEDLE BLADE 2-5/6 (NEEDLE) ×2 IMPLANT
ELECT REM PT RETURN 9FT ADLT (ELECTROSURGICAL) ×2
ELECTRODE REM PT RTRN 9FT ADLT (ELECTROSURGICAL) ×1 IMPLANT
GLOVE SURG SS PI 8.0 STRL IVOR (GLOVE) ×2 IMPLANT
GOWN STRL REUS W/ TWL LRG LVL3 (GOWN DISPOSABLE) ×1 IMPLANT
GOWN STRL REUS W/ TWL XL LVL3 (GOWN DISPOSABLE) ×1 IMPLANT
GOWN STRL REUS W/TWL LRG LVL3 (GOWN DISPOSABLE) ×2
GOWN STRL REUS W/TWL XL LVL3 (GOWN DISPOSABLE) ×2
KIT BASIN OR (CUSTOM PROCEDURE TRAY) ×2 IMPLANT
KIT TURNOVER KIT B (KITS) ×2 IMPLANT
LOOP VESSEL MINI RED (MISCELLANEOUS) ×2 IMPLANT
MARKER SKIN DUAL TIP RULER LAB (MISCELLANEOUS) ×1 IMPLANT
NDL HYPO 25GX1X1/2 BEV (NEEDLE) ×1 IMPLANT
NEEDLE HYPO 25GX1X1/2 BEV (NEEDLE) ×2 IMPLANT
NS IRRIG 1000ML POUR BTL (IV SOLUTION) ×2 IMPLANT
PACK GENERAL/GYN (CUSTOM PROCEDURE TRAY) ×2 IMPLANT
PAD ARMBOARD 7.5X6 YLW CONV (MISCELLANEOUS) ×4 IMPLANT
SUCTION FRAZIER HANDLE 10FR (MISCELLANEOUS) ×2
SUCTION TUBE FRAZIER 10FR DISP (MISCELLANEOUS) ×1 IMPLANT
SUT MNCRL AB 4-0 PS2 18 (SUTURE) ×1 IMPLANT
SUT PROLENE 6 0 BV (SUTURE) ×1 IMPLANT
SUT SILK 3 0 (SUTURE) ×2
SUT SILK 3-0 18XBRD TIE 12 (SUTURE) IMPLANT
SUT VIC AB 3-0 SH 27 (SUTURE) ×4
SUT VIC AB 3-0 SH 27X BRD (SUTURE) ×1 IMPLANT
SUT VIC AB 4-0 PS2 18 (SUTURE) ×2 IMPLANT
SYR CONTROL 10ML LL (SYRINGE) ×2 IMPLANT
TOWEL GREEN STERILE (TOWEL DISPOSABLE) ×2 IMPLANT
WATER STERILE IRR 1000ML POUR (IV SOLUTION) ×2 IMPLANT

## 2020-12-15 NOTE — Anesthesia Preprocedure Evaluation (Addendum)
Anesthesia Evaluation   Patient awake    Reviewed: Allergy & Precautions, NPO status , Patient's Chart, lab work & pertinent test results  History of Anesthesia Complications Negative for: history of anesthetic complications  Airway Mallampati: II  TM Distance: >3 FB Neck ROM: Full    Dental no notable dental hx.    Pulmonary former smoker,    Pulmonary exam normal        Cardiovascular hypertension, Normal cardiovascular exam  Moderate pericardial effusion   Neuro/Psych Depression negative neurological ROS     GI/Hepatic Neg liver ROS, hiatal hernia,   Endo/Other  negative endocrine ROS  Renal/GU negative Renal ROS  negative genitourinary   Musculoskeletal negative musculoskeletal ROS (+)   Abdominal   Peds  Hematology  (+) anemia , Hgb 9.8   Anesthesia Other Findings Giant cell arteritis  Reproductive/Obstetrics negative OB ROS                           Anesthesia Physical Anesthesia Plan  ASA: III  Anesthesia Plan: General   Post-op Pain Management:    Induction: Intravenous  PONV Risk Score and Plan: 3 and Treatment may vary due to age or medical condition and Ondansetron  Airway Management Planned: LMA  Additional Equipment: None  Intra-op Plan:   Post-operative Plan: Extubation in OR  Informed Consent: I have reviewed the patients History and Physical, chart, labs and discussed the procedure including the risks, benefits and alternatives for the proposed anesthesia with the patient or authorized representative who has indicated his/her understanding and acceptance.     Dental advisory given  Plan Discussed with: CRNA  Anesthesia Plan Comments:       Anesthesia Quick Evaluation

## 2020-12-15 NOTE — Progress Notes (Signed)
PROGRESS NOTE    Mckenzie [name]  ZJQ:[id number] DOB: 04/11/1946 DOA: 12/07/2020 PCP: Loman Brooklyn, FNP     Brief Narrative:  Mckenzie Range Websteris a 75 y.o.femalewith medical history significant of hypertension, hyperlipidemia, chronic anxiety/depression whopresents after being advised by her primary care provider to come in for low blood counts and low sodium levels. Patient's daughter is present at bedside and provides much of the patient's history. Apparently over the last 3 months patient has been dealing with weakness and confusion. Last hospitalized on 10/05/2020 after having a fall, status post open reduction internal fixation ofa leftdistal radial fracture.Since that hospitalization patient daughter noted that she has had a progressive decline and now needs assistance to complete pretty much all of her ADLs. It seems she was started on Lexapro 2 months prior for adjustment disorder with anxiety and depression. Daughter notes that the medication has been helping and would like to continue this medication. She has been followed by Dr. Allyson Sabal of neurology for worsening confusion and weakness. Patient had been worked up for for her symptoms and had been told that she did not have dementia. Also reports patient having EMG studies that were negative. During this time patient sodium and hemoglobin levels have been trending down. No blood in stool, she has been on ferrous sulfate supplements. She had followed up with [facility] and was scheduled to have a colonoscopy on 12/11/20.  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   Her hemoglobin down trended to 7.4, iron studies suggestive of iron deficiency, negative FOBT.  She was seen by GI, she had an EGD and colonoscopy done on 12/09/2020 which findings did not explain her anemia.   Hematology/medical oncology recommended to optimize hemoglobin at least 9.0.  Hospital course complicated by persistent generalized weakness.   Inflammatory markers returned significantly elevated CRP 12.3, ESR 112 on 12/09/2020.   She had a CT cervical spine which revealed a 2 x 1 x 1 cm fluid collection at the C7.  She had an EEG on 12/11/20, no evidence of seizure.  CTA chest done 12/11/2020 revealed enlarging pericardial effusion, no evidence of pulmonary emboli, wall thickening noted in the descending thoracic aorta, enlarging left pleural effusion with underlying left lower lobe consolidation.  Patient underwent thoracentesis on 3/9.  New events last 24 hours / Subjective: Patient seen with husband at bedside.  Patient awaiting for temporal artery biopsy later this morning.  No specific complaints today.  Denies any shortness of breath, chest pain.  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 Doylene Canard.  Agrees with prednisone use, plans to follow-up in 2 weeks.  Continue  colchicine  Left pleural effusion -Status post thoracentesis 3/9. Technically transudative but pleural protein/serum protein is very borderline for Light's criteria. Fluid  culture negative to date.  Cytology without malignant cells noted  C7 fluid collection -MR cervical spine: Enhancement and edema about the dorsal tips of the C7 and T1 spinous processes, with a superimposed 2 cm collection adjacent to the C7 spinous process. Findings are nonspecific, but can be seen in the setting of underlying inflammatory conditions including polymyalgia rheumatica. In this setting, the collection could reflect a small bursal collection. Possible infection would be the primary differential consideration, and could be considered in the correct clinical setting  -Patient without significant signs of systemic infection however with normal lactic acid, normal procalcitonin, normal temperature  Proximal muscle weakness -Has been worked up by Dr. Allyson Sabal of neurology as an outpatient.  Question possible PMR diagnosis -Agree with steroids -Pending temporal artery biopsy  Anemia of chronic disease -Status post EGD, colonoscopy 3/5 without GI etiology of anemia.  GI has signed off -Appreciate hematology -Goal hemoglobin >9 -Stable   History of bilateral cerebellar CVA -Continue aspirin  Hyperlipidemia -Patient has not tolerated statins in the past  Hypertension -Continue Norvasc  Chronic diastolic heart failure -Without evidence of acute exacerbation at this time  Generalized weakness -PT OT  Severe protein calorie malnutrition -Estimated body mass index is 17.47 kg/m as calculated from the following:   Height as of 12/06/20: _0  (1.651 m).   Weight as of 12/06/20: 47.6 kg.      In agreement with assessment of the pressure ulcer as below:  Pressure Injury 12/08/20 Coccyx Medial Stage 1 -  Intact skin with non-blanchable redness of a localized area usually over a bony prominence. not open,  purple area (Active)  12/08/20 1834  Location: Coccyx  Location Orientation: Medial  Staging: Stage 1 -  Intact skin with non-blanchable redness of a localized area usually  over a bony prominence.  Wound Description (Comments): not open,  purple area  Present on Admission: Yes     Nutrition Problem: Moderate Malnutrition Etiology: chronic illness (HTN, hyperlipidemia)   DVT prophylaxis:  SCDs Start: 12/08/20 0856  Code Status: Full Family Communication: Spouse at bedside Disposition Plan:  Status is: Inpatient  Remains inpatient appropriate because:Ongoing diagnostic testing needed not appropriate for outpatient work up   Dispo:  Patient From: Home  Planned Disposition: Home with Health Care Svc  Medically stable for discharge: No.  Temporal artery biopsy today.  PT OT pending        Antimicrobials:  Anti-infectives (From admission, onward)   None       Objective: Vitals:   12/14/20 1233 12/14/20 2113 12/15/20 0535 12/15/20 0842  BP: (!) 143/61 (!) 146/62 139/62 (!) 159/69  Pulse: 71 72 73   Resp: _1 Temp: 98.2 F (36.8 C) 97.7 F (36.5 C) 98.5 F (36.9 C) 98.3 F (36.8 C)  TempSrc:    Oral  SpO2: 96% 96% 98% 95%   No intake or output data in the 24 hours ending 12/15/20 1202 There were no vitals filed for this visit.  Examination: General exam: Appears calm and comfortable  Respiratory system: Clear to auscultation. Respiratory effort normal. Cardiovascular system: S1 & S2 heard, RRR. No pedal edema. Gastrointestinal system: Abdomen is nondistended, soft and nontender. Normal bowel sounds heard. Central nervous system: Alert and oriented.  Extremities: Symmetric in appearance bilaterally  Skin: No rashes, lesions or ulcers on exposed skin  Psychiatry: Judgement  and insight appear stable.  Data Reviewed: I have personally reviewed following labs and imaging studies  CBC: Recent Labs  Lab 12/10/20 0115 12/11/20 0259 12/12/20 0153 12/12/20 1200 12/13/20 1524 12/14/20 0132 12/15/20 0304  WBC 8.3 10.4 10.7*  --  9.5 9.6 5.9  NEUTROABS 6.4  --   --   --   --   --   --   HGB 9.4* 9.3* 8.6* 9.0* 9.0* 9.0*  9.8*  HCT 28.9* 28.7* 26.5* 28.1* 27.6* 28.9* 30.7*  MCV 77.7* 78.4* 77.7*  --  78.2* 79.4* 77.5*  PLT 530* 580* 554*  --  504* 516* 012*   Basic Metabolic Panel: Recent Labs  Lab 12/10/20 0115 12/11/20 0243 12/11/20 1411 12/12/20 0153 12/12/20 1200 12/13/20 0252 12/13/20 1524 12/14/20 0132 12/15/20 0304  NA 131* 127* 130* 126* 127* 127* 126* 128* 130*  K 3.4* 3.3* 3.1* 4.2  --  4.4 4.1 4.5 4.1  CL 96* 94* 96* 94*  --  95* 95* 96* 94*  CO2 _0 --  _1 GLUCOSE 120* 115* 153* 115*  --  125* 140* 113* 274*  BUN 6* 9 7* 10  --  _2 CREATININE 0.34* 0.37* 0.33* 0.35*  --  0.37* 0.32* <0.30* 0.33*  CALCIUM 8.4* 8.1* 7.9* 8.0*  --  8.1* 8.1* 8.3* 9.2  MG 1.7 1.6* 2.0 1.8  --  1.8  --   --   --   PHOS 4.0  --   --   --   --   --   --   --   --    GFR: Estimated Creatinine Clearance: 45.7 mL/min (A) (by C-G formula based on SCr of 0.33 mg/dL (L)). Liver Function Tests: Recent Labs  Lab 12/09/20 0203 12/10/20 0115 12/13/20 1524  AST _3 ALT 46* 33 31  ALKPHOS 121 108 105  BILITOT 0.5 1.2 0.2*  PROT 5.9* 5.9* 5.9*  ALBUMIN 1.6* 1.6* 1.6*   No results for input(s): LIPASE, AMYLASE in the last 168 hours. No results for input(s): AMMONIA in the last 168 hours. Coagulation Profile: No results for input(s): INR, PROTIME in the last 168 hours. Cardiac Enzymes: Recent Labs  Lab 12/10/20 0543  CKTOTAL <5*   BNP (last 3 results) No results for input(s): PROBNP in the last 8760 hours. HbA1C: Recent Labs    12/14/20 1014  HGBA1C 5.7*   CBG: Recent Labs  Lab 12/14/20 1246 12/14/20 1628 12/14/20 2135 12/15/20 0746  GLUCAP 118* 103* 272* 154*   Lipid Profile: No results for input(s): CHOL, HDL, LDLCALC, TRIG, CHOLHDL, LDLDIRECT in the last 72 hours. Thyroid Function Tests: No results for input(s): TSH, T4TOTAL, FREET4, T3FREE, THYROIDAB in the last 72 hours. Anemia Panel: No results for input(s): VITAMINB12, FOLATE, FERRITIN, TIBC,  IRON, RETICCTPCT in the last 72 hours. Sepsis Labs: Recent Labs  Lab 12/10/20 0543  PROCALCITON <0.10  LATICACIDVEN 0.8    Recent Results (from the past 240 hour(s))  Resp Panel by RT-PCR (Flu A&B, Covid) Nasopharyngeal Swab     Status: None   Collection Time: 12/08/20  2:34 AM   Specimen: Nasopharyngeal Swab; Nasopharyngeal(NP) swabs in vial transport medium  Result Value Ref Range Status   SARS Coronavirus 2 by RT PCR NEGATIVE NEGATIVE Final    Comment: (NOTE) SARS-CoV-2 target nucleic acids are NOT DETECTED.  The SARS-CoV-2 RNA is generally detectable in upper respiratory specimens during the acute phase of infection.  The lowest concentration of SARS-CoV-2 viral copies this assay can detect is 138 copies/mL. A negative result does not preclude SARS-Cov-2 infection and should not be used as the sole basis for treatment or other patient management decisions. A negative result may occur with  improper specimen collection/handling, submission of specimen other than nasopharyngeal swab, presence of viral mutation(s) within the areas targeted by this assay, and inadequate number of viral copies(<138 copies/mL). A negative result must be combined with clinical observations, patient history, and epidemiological information. The expected result is Negative.  Fact Sheet for Patients:  EntrepreneurPulse.com.au  Fact Sheet for Healthcare Providers:  IncredibleEmployment.be  This test is no t yet approved or cleared by the [place] FDA and  has been authorized for detection and/or diagnosis of SARS-CoV-2 by FDA under an Emergency Use Authorization (EUA). This EUA will remain  in effect (meaning this test can be used) for the duration of the COVID-19 declaration under Section 564(b)(1) of the Act, 21 U.S.C.section 360bbb-3(b)(1), unless the authorization is terminated  or revoked sooner.       Influenza A by PCR NEGATIVE NEGATIVE Final    Influenza B by PCR NEGATIVE NEGATIVE Final    Comment: (NOTE) The Xpert Xpress SARS-CoV-2/FLU/RSV plus assay is intended as an aid in the diagnosis of influenza from Nasopharyngeal swab specimens and should not be used as a sole basis for treatment. Nasal washings and aspirates are unacceptable for Xpert Xpress SARS-CoV-2/FLU/RSV testing.  Fact Sheet for Patients: EntrepreneurPulse.com.au  Fact Sheet for Healthcare Providers: IncredibleEmployment.be  This test is not yet approved or cleared by the [place] FDA and has been authorized for detection and/or diagnosis of SARS-CoV-2 by FDA under an Emergency Use Authorization (EUA). This EUA will remain in effect (meaning this test can be used) for the duration of the COVID-19 declaration under Section 564(b)(1) of the Act, 21 U.S.C. section 360bbb-3(b)(1), unless the authorization is terminated or revoked.  Performed at [facility], [place] [address], [place], [place] [address]   Culture, body fluid w Gram Stain-bottle     Status: None (Preliminary result)   Collection Time: 12/13/20  2:38 PM   Specimen: Fluid  Result Value Ref Range Status   Specimen Description FLUID PLEURAL LEFT  Final   Special Requests BOTTLES DRAWN AEROBIC AND ANAEROBIC  Final   Culture   Final    NO GROWTH < 24 HOURS Performed at [facility], [place] [address], [place], [place] [address]    Report Status PENDING  Incomplete  Gram stain     Status: None   Collection Time: 12/13/20  2:38 PM   Specimen: Fluid  Result Value Ref Range Status   Specimen Description FLUID PLEURAL LEFT  Final   Special Requests NONE  Final   Gram Stain   Final    WBC PRESENT, PREDOMINANTLY MONONUCLEAR NO ORGANISMS SEEN CYTOSPIN SMEAR Performed at [facility], [place] [address], [place], [place] [address]    Report Status 12/13/2020 FINAL  Final      Radiology Studies: DG Chest 1 View  Result Date: 12/13/2020 CLINICAL  DATA:  Status post left thoracentesis. EXAM: CHEST  1 VIEW COMPARISON:  Chest CT 12/11/2020 FINDINGS: Interval evacuation of the left pleural effusion without postprocedural pneumothorax. Minimal left basilar atelectasis. No right pleural effusion. Stable mild eventration of the right hemidiaphragm. Stable borderline cardiac enlargement. IMPRESSION: Status post left-sided thoracentesis without postprocedural pneumothorax. Electronically Signed   By: Marijo Sanes M.D.   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 Interventional radiology asked to perform a therapeutic and diagnostic thoracentesis. EXAM: ULTRASOUND GUIDED THORACENTESIS MEDICATIONS: 1% lidocaine 10 mL COMPLICATIONS: None immediate. PROCEDURE: An ultrasound guided thoracentesis was thoroughly discussed with the patient and questions answered. The benefits, risks, alternatives and complications were also discussed. The patient understands and wishes to proceed with the procedure. Written consent was obtained. Ultrasound was performed to localize and mark an adequate pocket of fluid in the left chest. The area was then prepped and draped in the normal sterile fashion. 1% Lidocaine was used for local anesthesia. Under ultrasound guidance a 6 Fr Safe-T-Centesis catheter was introduced. Thoracentesis was performed. The catheter was removed and a dressing applied. FINDINGS: A total of approximately 300 mL of clear yellow fluid was removed. Samples were sent to the laboratory as requested by the clinical team. IMPRESSION: Successful ultrasound guided left thoracentesis yielding 300 mL of pleural fluid. Read by: Soyla Dryer, NP Electronically Signed   By: Jerilynn Mages.  Shick M.D.   On: 12/13/2020 15:22      Scheduled Meds: . [MAR Hold] amLODipine  2.5 mg Oral Daily  . [MAR Hold] aspirin EC  81 mg  Oral Daily  . [MAR Hold] timolol  1 drop Both Eyes BID   And  . [MAR Hold] brimonidine  1 drop Both Eyes BID  . [MAR Hold] calcium-vitamin D  1 tablet Oral Daily  . [MAR Hold] colchicine  0.6 mg Oral Daily  . [MAR Hold] dorzolamide  1 drop Both Eyes BID  . [MAR Hold] escitalopram  20 mg Oral Daily  . [MAR Hold] feeding supplement  237 mL Oral BID BM  . [MAR Hold] ferrous sulfate  325 mg Oral Q breakfast  . [MAR Hold] fluorometholone  1 drop Right Eye BID  . [MAR Hold] insulin aspart  0-15 Units Subcutaneous TID WC  . [MAR Hold] ketorolac  1 drop Right Eye Daily  . [MAR Hold] latanoprost  1 drop Both Eyes QHS  . [MAR Hold] multivitamin with minerals  1 tablet Oral Daily  . [MAR Hold] Netarsudil Dimesylate  1 drop Both Eyes QHS  . [MAR Hold] pantoprazole  40 mg Oral Daily  . [MAR Hold] prednisoLONE acetate  1 drop Left Eye Daily  . [MAR Hold] predniSONE  48 mg Oral Q breakfast  . [MAR Hold] sodium chloride flush  3 mL Intravenous Q12H  . [MAR Hold] sodium chloride  1 g Oral TID WC   Continuous Infusions:   LOS: 7 days      Time spent: 25 minutes   Dessa Phi, DO [facility] 12/15/2020, 12:02 PM   Available via Epic secure chat 7am-7pm After these hours, please refer to coverage provider listed on [email or web address]

## 2020-12-15 NOTE — Transfer of Care (Signed)
Immediate Anesthesia Transfer of Care Note  Patient: Mckenzie [name]  Procedure(s) Performed: BILATERAL TEMPORAL ARTERY BIOPSIES (Bilateral Head)  Patient Location: PACU  Anesthesia Type:General  Level of Consciousness: awake, drowsy and patient cooperative  Airway & Oxygen Therapy: Patient Spontanous Breathing and Patient connected to nasal cannula oxygen  Post-op Assessment: Report given to RN, Post -op Vital signs reviewed and stable and Patient moving all extremities X 4  Post vital signs: Reviewed and stable  Last Vitals:  Vitals Value Taken Time  BP    Temp    Pulse    Resp    SpO2      Last Pain:  Vitals:   12/15/20 0842  TempSrc: Oral  PainSc:          Complications: No complications documented.

## 2020-12-15 NOTE — Interval H&P Note (Signed)
History and Physical Interval Note:  12/15/2020 11:47 AM  Mckenzie [name]  has presented today for surgery, with the diagnosis of [place].  The various methods of treatment have been discussed with the patient and family. After consideration of risks, benefits and other options for treatment, the patient has consented to  Procedure(s): BIOPSY TEMPORAL ARTERY (N/A) as a surgical intervention.  The patient's history has been reviewed, patient examined, no change in status, stable for surgery.  I have reviewed the patient's chart and labs.  Questions were answered to the patient's satisfaction.     Deitra Mayo

## 2020-12-15 NOTE — Progress Notes (Signed)
OT Cancellation Note  Patient Details Name: MIYU FENDERSON MRN: [id number] DOB: 1946/02/09   Cancelled Treatment:    Reason Eval/Treat Not Completed: Other (comment) Upon arrival patient sleeping, able to arouse but stating she is very fatigued, spouse states she is supposed to go for procedure later this morning. Will re-attempt as schedule permits.   Delbert Phenix OT OT pager: [place] 12/15/2020, 9:10 AM

## 2020-12-15 NOTE — Progress Notes (Signed)
Received a call from [facility] post-op.  Pt. VSS and bilateral temporal biospsy complete, and patient is being returned to room 2W31

## 2020-12-15 NOTE — Progress Notes (Signed)
Received chat message from Dr. Bayard Beaver:  Noticed the patient didn't get insulin yesterday -- wanted to make sure there's no issues with the orders?  Responded:  Not that I'm aware of, although I was floated to this unit this morning and am new to the pt. Orders are there. NT checking blood sugars now. Will administer per sliding scale (if needed)

## 2020-12-15 NOTE — Anesthesia Procedure Notes (Signed)
Procedure Name: LMA Insertion Date/Time: 12/15/2020 12:07 PM Performed by: Gaylene Brooks, CRNA Pre-anesthesia Checklist: Patient identified, Emergency Drugs available, Suction available and Patient being monitored Patient Re-evaluated:Patient Re-evaluated prior to induction Oxygen Delivery Method: Circle system utilized Preoxygenation: Pre-oxygenation with 100% oxygen Induction Type: IV induction Ventilation: Mask ventilation without difficulty LMA: LMA inserted LMA Size: 4.0 Number of attempts: 1 Placement Confirmation: positive ETCO2 and breath sounds checked- equal and bilateral Tube secured with: Tape Dental Injury: Teeth and Oropharynx as per pre-operative assessment

## 2020-12-15 NOTE — Progress Notes (Signed)
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, being treated for GCA  Major prior workup:  Highly elevated ESR / CRP, thrombocytosis and anemia CTA chest w/ pericaridal and pleural effusions Thoracentesis with transudative effusion  Subjective and interval events First dose of steroids 3/10 afternoon Glucoses in the 200s but has not yet received ordered insulin per EMR Status post temporal artery biopsy by vascular surgery today Denies any headache or any other acute complaints Husband reports that the patient if anything was a bit sleepier after receiving steroids  Exam: Vitals:   12/14/20 2113 12/15/20 0535  BP: (!) 146/62 139/62  Pulse: 72 73  Resp: 18 16  Temp: 97.7 F (36.5 C) 98.5 F (36.9 C)  SpO2: 96% 98%   Gen: In bed, a bit sleepy postoperatively, examined in PACU Resp: non-labored breathing, no acute distress Abd: soft, nt Skin: Warm and dry, no rash noted  PQ:ZRAQTM but alerts with minor stimulation, oriented to hospital, able to follow simple commands,  Mental Status: AA&Ox3  Speech/Language: speech is without dysarthria or aphasia. Patient able to hold a conversation and answer questions appropriately.   Cranial Nerves:  II: PERRL.  III, IV, VI: EOMI. Lid elevation symmetric and full.  V: sensation is intact and symmetrical to face.  VII: Smile is symmetrical. Able to raise eyebrows.  VIII:hearing intact to voice AUQ:JFHLKT is midline without fasciculations. Sensory:Intact to light touch bilaterally Motor: Mild deltoid weakness 4/5 bilaterally, hip flexor weakness 3/5 bilaterally, stable.  On prior examinations 5/5 distally otherwise though pain limited due to prior wrist fractures in the bilateral wrists DTR:3+ throughout   Pertinent Labs:  Basic Metabolic Panel: Recent Labs  Lab 12/10/20 0115 12/11/20 0243  12/11/20 1411 12/12/20 0153 12/12/20 1200 12/13/20 0252 12/13/20 1524 12/14/20 0132 12/15/20 0304  NA 131* 127* 130* 126* 127* 127* 126* 128* 130*  K 3.4* 3.3* 3.1* 4.2  --  4.4 4.1 4.5 4.1  CL 96* 94* 96* 94*  --  95* 95* 96* 94*  CO2 _0 --  _1 GLUCOSE 120* 115* 153* 115*  --  125* 140* 113* 274*  BUN 6* 9 7* 10  --  _2 CREATININE 0.34* 0.37* 0.33* 0.35*  --  0.37* 0.32* <0.30* 0.33*  CALCIUM 8.4* 8.1* 7.9* 8.0*  --  8.1* 8.1* 8.3* 9.2  MG 1.7 1.6* 2.0 1.8  --  1.8  --   --   --   PHOS 4.0  --   --   --   --   --   --   --   --     CBC: Recent Labs  Lab 12/10/20 0115 12/11/20 0259 12/12/20 0153 12/12/20 1200 12/13/20 1524 12/14/20 0132 12/15/20 0304  WBC 8.3 10.4 10.7*  --  9.5 9.6 5.9  NEUTROABS 6.4  --   --   --   --   --   --   HGB 9.4* 9.3* 8.6* 9.0* 9.0* 9.0* 9.8*  HCT 28.9* 28.7* 26.5* 28.1* 27.6* 28.9* 30.7*  MCV 77.7* 78.4* 77.7*  --  78.2* 79.4* 77.5*  PLT 530* 580* 554*  --  504* 516* 560*   Impression: Patient appears to be doing well on prednisone.  Recommendations:  Recommend the following taper of prednisone: - 26m x 2 weeks (3/10-3/23) THEN - 421mx 2 weeks THEN - 35 mg  x 2 weeks THEN - 30 mg x 2 weeks  THEN - 25 mg x 2 weeks THEN - 20 mg and hold there pending follow-up with rheumatology; this taper may be modified by rheumatology per patient's clinical course - Vitamin D, Calcium and protonix while on high dose steroids, may be discontinued if steroids are stopped (note patient is already on Vitamin D supplementation)  - TID AC + QHS glucose checks a MDSSI while on high dose steroids inpatient. Appreciate primary team determination of glucose management on discharge - F/u Cytology from pleural fluid - Follow-up temporal artery biopsy results - Neurology will continue to follow, will next plan to see the patient either Saturday or Sunday unless new acute questions arise  Lesleigh Noe MD-PhD [facility] [phone] Available 7 AM to 7 PM, outside these hours please contact Neurologist on call listed on AMION

## 2020-12-15 NOTE — Op Note (Signed)
    NAME: DINNA SEVERS    MRN: [id number] DOB: 1946-06-13    DATE OF OPERATION: 12/15/2020  PREOP DIAGNOSIS:    Rule out temporal arteritis  POSTOP DIAGNOSIS:    Same  PROCEDURE:    Bilateral temporal artery biopsies  SURGEON: Judeth Cornfield. Scot Dock, MD  ASSIST: None  ANESTHESIA: General  EBL: Minimal  INDICATIONS:    Mckenzie [name] is a 75 y.o. female who presented with worsening fatigue weakness and confusion over the last several months.  The patient had elevated inflammatory markers concerning for possible giant cell arteritis.  She has been started on prednisone.  We were asked to proceed with temporal artery biopsy.  FINDINGS:   Very small temporal arteries that seem somewhat inflamed  TECHNIQUE:   The patient was taken to the operating room and received a general anesthetic.  Identified the location of the temporal artery on the right with a Doppler.  The temporal area was then prepped and draped in usual sterile fashion.  An incision approximately 3 cm in length was made just anterior to the ear and the dissection carried down to the temporal artery.  There was an overlying vein which was dissected free and ligated.  The artery was quite small and seemed somewhat inflamed.  A segment of this was dissected free and ligated proximally and dental distally and then the specimen was sent to pathology.  Likewise on the left side the area of the temporal artery was identified with a Doppler.  The temporal area was prepped and draped in usual sterile fashion.  A 3 to 4 cm incision was made over the temporal artery just anterior to the ear and the again the vein was identified and ligated to allow exposure of the temporal artery which was quite small and inflamed.  I dissected out in a pop approximately 4 cm length.  The artery was ligated proximally and distally and the specimen sent to pathology.  Each of these incisions was closed with interrupted 3-0 Vicryl's and the  skin closed with 4-0 Vicryl.  Dermabond was applied.  Patient tolerated procedure well.  She was transferred to recovery room in stable condition.   Deitra Mayo, MD, FACS [facility]  DATE OF DICTATION:   12/15/2020

## 2020-12-15 NOTE — Consult Note (Signed)
Ref: Loman Brooklyn, FNP   Subjective:  Awake. VS stable. No chest pain. Improving sodium level to 130 mmol. Hyperglycemia from Prednisone use.  Objective:  Vital Signs in the last 24 hours: Temp:  [97.7 F (36.5 C)-98.5 F (36.9 C)] 98.3 F (36.8 C) (03/11 0842) Pulse Rate:  [71-73] 73 (03/11 0535) Cardiac Rhythm: Normal sinus rhythm (03/11 0700) Resp:  [16-20] 17 (03/11 0842) BP: (139-159)/(61-69) 159/69 (03/11 0842) SpO2:  [95 %-98 %] 95 % (03/11 0842)  Physical Exam: BP Readings from Last 1 Encounters:  12/15/20 (!) 159/69     Wt Readings from Last 1 Encounters:  12/06/20 47.6 kg    Weight change:  There is no height or weight on file to calculate BMI. HEENT: [place]/AT, Eyes-Blue, Conjunctiva-Pink, Sclera-Non-icteric Neck: No JVD, No bruit, Trachea midline. Lungs:  Clearing, Bilateral. Cardiac:  Regular rhythm, normal S1 and S2, no S3. II/VI systolic murmur. Abdomen:  Soft, non-tender. BS present. Extremities:  No edema present. No cyanosis. No clubbing. CNS: AxOx3, Cranial nerves grossly intact, moves all 4 extremities.  Skin: Warm and dry.   Intake/Output from previous day: No intake/output data recorded.    Lab Results: BMET    Component Value Date/Time   NA 130 (L) 12/15/2020 0304   NA 128 (L) 12/14/2020 0132   NA 126 (L) 12/13/2020 1524   NA 126 (L) 12/06/2020 1341   NA 129 (L) 12/01/2020 1127   NA 129 (L) 11/29/2020 1206   K 4.1 12/15/2020 0304   K 4.5 12/14/2020 0132   K 4.1 12/13/2020 1524   CL 94 (L) 12/15/2020 0304   CL 96 (L) 12/14/2020 0132   CL 95 (L) 12/13/2020 1524   CO2 27 12/15/2020 0304   CO2 24 12/14/2020 0132   CO2 22 12/13/2020 1524   GLUCOSE 274 (H) 12/15/2020 0304   GLUCOSE 113 (H) 12/14/2020 0132   GLUCOSE 140 (H) 12/13/2020 1524   BUN 18 12/15/2020 0304   BUN 10 12/14/2020 0132   BUN 11 12/13/2020 1524   BUN 10 12/06/2020 1341   BUN 8 12/01/2020 1127   BUN 8 11/29/2020 1206   CREATININE 0.33 (L) 12/15/2020 0304    CREATININE <0.30 (L) 12/14/2020 0132   CREATININE 0.32 (L) 12/13/2020 1524   CREATININE 0.63 01/23/2018 0803   CREATININE 0.63 07/16/2017 0913   CREATININE 0.63 01/07/2017 0800   CALCIUM 9.2 12/15/2020 0304   CALCIUM 8.3 (L) 12/14/2020 0132   CALCIUM 8.1 (L) 12/13/2020 1524   GFRNONAA >60 12/15/2020 0304   GFRNONAA NOT CALCULATED 12/14/2020 0132   GFRNONAA >60 12/13/2020 1524   GFRNONAA 90 01/23/2018 0803   GFRNONAA 90 07/16/2017 0913   GFRNONAA >89 06/27/2016 1704   GFRAA 120 12/01/2020 1127   GFRAA 120 11/29/2020 1206   GFRAA 121 11/21/2020 1641   GFRAA 104 01/23/2018 0803   GFRAA 104 07/16/2017 0913   GFRAA >89 06/27/2016 1704   CBC    Component Value Date/Time   WBC 5.9 12/15/2020 0304   RBC 3.96 12/15/2020 0304   HGB 9.8 (L) 12/15/2020 0304   HGB 8.7 (L) 12/06/2020 1341   HCT 30.7 (L) 12/15/2020 0304   HCT 28.4 (L) 12/06/2020 1341   PLT 560 (H) 12/15/2020 0304   PLT 666 (H) 12/06/2020 1341   MCV 77.5 (L) 12/15/2020 0304   MCV 79 12/06/2020 1341   MCH 24.7 (L) 12/15/2020 0304   MCHC 31.9 12/15/2020 0304   RDW 16.4 (H) 12/15/2020 0304   RDW 14.0 12/06/2020 1341  LYMPHSABS 1.1 12/10/2020 0115   LYMPHSABS 1.1 12/06/2020 1341   MONOABS 0.7 12/10/2020 0115   EOSABS 0.1 12/10/2020 0115   EOSABS 0.1 12/06/2020 1341   BASOSABS 0.0 12/10/2020 0115   BASOSABS 0.0 12/06/2020 1341   HEPATIC Function Panel Recent Labs    12/09/20 0203 12/10/20 0115 12/13/20 1524  PROT 5.9* 5.9* 5.9*   HEMOGLOBIN A1C No components found for: HGA1C,  MPG CARDIAC ENZYMES Lab Results  Component Value Date   CKTOTAL <5 (L) 12/10/2020   BNP No results for input(s): PROBNP in the last 8760 hours. TSH Recent Labs    09/26/20 1548  TSH 2.100   CHOLESTEROL Recent Labs    01/04/20 0852 12/11/20 0243  CHOL 151 127    Scheduled Meds: . amLODipine  2.5 mg Oral Daily  . aspirin EC  81 mg Oral Daily  . timolol  1 drop Both Eyes BID   And  . brimonidine  1 drop Both Eyes BID   . calcium-vitamin D  1 tablet Oral Daily  . colchicine  0.6 mg Oral Daily  . dorzolamide  1 drop Both Eyes BID  . escitalopram  20 mg Oral Daily  . feeding supplement  237 mL Oral BID BM  . ferrous sulfate  325 mg Oral Q breakfast  . fluorometholone  1 drop Right Eye BID  . insulin aspart  0-15 Units Subcutaneous TID WC  . ketorolac  1 drop Right Eye Daily  . latanoprost  1 drop Both Eyes QHS  . multivitamin with minerals  1 tablet Oral Daily  . Netarsudil Dimesylate  1 drop Both Eyes QHS  . pantoprazole  40 mg Oral Daily  . prednisoLONE acetate  1 drop Left Eye Daily  . predniSONE  48 mg Oral Q breakfast  . sodium chloride flush  3 mL Intravenous Q12H  . 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  Continue colchicine.   LOS: 7 days   Time spent including chart review, lab review, examination, discussion with patient/Family : 30 min   Dixie Dials  MD  12/15/2020, 8:48 AM

## 2020-12-16 DIAGNOSIS — R531 Weakness: Secondary | ICD-10-CM | POA: Diagnosis not present

## 2020-12-16 DIAGNOSIS — E871 Hypo-osmolality and hyponatremia: Secondary | ICD-10-CM | POA: Diagnosis not present

## 2020-12-16 LAB — GLUCOSE, CAPILLARY
Glucose-Capillary: 93 mg/dL (ref 70–99)
Glucose-Capillary: 133 mg/dL — ABNORMAL HIGH (ref 70–99)
Glucose-Capillary: 176 mg/dL — ABNORMAL HIGH (ref 70–99)
Glucose-Capillary: 182 mg/dL — ABNORMAL HIGH (ref 70–99)

## 2020-12-16 LAB — CBC
HCT: 29 % — ABNORMAL LOW (ref 36.0–46.0)
Hemoglobin: 8.9 g/dL — ABNORMAL LOW (ref 12.0–15.0)
MCH: 24.7 pg — ABNORMAL LOW (ref 26.0–34.0)
MCHC: 30.7 g/dL (ref 30.0–36.0)
MCV: 80.3 fL (ref 80.0–100.0)
Platelets: 553 10*3/uL — ABNORMAL HIGH (ref 150–400)
RBC: 3.61 MIL/uL — ABNORMAL LOW (ref 3.87–5.11)
RDW: 16.5 % — ABNORMAL HIGH (ref 11.5–15.5)
WBC: 12.9 10*3/uL — ABNORMAL HIGH (ref 4.0–10.5)
nRBC: 0 % (ref 0.0–0.2)

## 2020-12-16 LAB — BASIC METABOLIC PANEL
Anion gap: 6 (ref 5–15)
BUN: 15 mg/dL (ref 8–23)
CO2: 28 mmol/L (ref 22–32)
Calcium: 8.8 mg/dL — ABNORMAL LOW (ref 8.9–10.3)
Chloride: 98 mmol/L (ref 98–111)
Creatinine, Ser: 0.48 mg/dL (ref 0.44–1.00)
GFR, Estimated: >60 mL/min (ref >60)
Glucose, Bld: 148 mg/dL — ABNORMAL HIGH (ref 70–99)
Potassium: 4.6 mmol/L (ref 3.5–5.1)
Sodium: 132 mmol/L — ABNORMAL LOW (ref 135–145)

## 2020-12-16 MED ORDER — INSULIN ASPART 100 UNIT/ML ~~LOC~~ SOLN
0.0000 [IU] | Freq: Every day | SUBCUTANEOUS | Status: DC
  Administered 2020-12-17: 2 [IU] via SUBCUTANEOUS

## 2020-12-16 MED ORDER — INSULIN ASPART 100 UNIT/ML ~~LOC~~ SOLN
0.0000 [IU] | Freq: Three times a day (TID) | SUBCUTANEOUS | Status: DC
  Administered 2020-12-16: 2 [IU] via SUBCUTANEOUS
  Administered 2020-12-16: 3 [IU] via SUBCUTANEOUS
  Administered 2020-12-17: 2 [IU] via SUBCUTANEOUS
  Administered 2020-12-17: 3 [IU] via SUBCUTANEOUS
  Administered 2020-12-18: 5 [IU] via SUBCUTANEOUS
  Administered 2020-12-19: 3 [IU] via SUBCUTANEOUS

## 2020-12-16 NOTE — Progress Notes (Signed)
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, being treated for GCA  Major prior workup:  3/5 Highly elevated ESR / CRP, thrombocytosis and anemia 3/7 CTA chest w/ pericaridal and pleural effusions 3/9 Thoracentesis with transudative effusion 3/10 Began 1 mg/kg prednisone 3/11 Temporal artery biopsy completed  Subjective and interval events Denies any headache or any other acute complaints Per daughter patient is brighter, moving more easily and eating much more since starting steroids Patient reports her pain has significantly improved  Exam: Vitals:   12/15/20 2316 12/16/20 0631  BP: (!) 119/53 133/62  Pulse: 75 71  Resp:  17  Temp:  97.8 F (36.6 C)  SpO2:  96%   Gen: In bed, a bit sleepy postoperatively, examined in PACU Resp: non-labored breathing, no acute distress Abd: soft, nt Skin: Warm and dry, no rash noted Musculoskeletal: Patient no longer has pain on palpation of the cervical spine.  FT:DDUKGU but alerts with minor stimulation, oriented to hospital, able to follow simple commands,  Mental Status: AA&Ox3  Speech/Language: speech is without dysarthria or aphasia. Patient able to hold a conversation and answer questions appropriately.   Cranial Nerves:  II: PERRL.  Visual fields full to confrontation III, IV, VI: EOMI. Lid elevation symmetric and full.  V: sensation is intact and symmetrical to face.  VII: Smile is symmetrical. Able to raise eyebrows.  VIII:hearing intact to voice RKY:HCWCBJ is midline without fasciculations. Sensory:Intact to light touch bilaterally Motor: Mild deltoid weakness 4+/5 bilaterally, hip flexor weakness 4-/5 bilaterally, stable.  Pain in the wrists has also dramatically improved DTR:3+ throughout   Pertinent Labs:  Basic Metabolic Panel: Recent Labs  Lab 12/10/20 0115 12/11/20 0243  12/11/20 1411 12/12/20 0153 12/12/20 1200 12/13/20 0252 12/13/20 1524 12/14/20 0132 12/15/20 0304 12/16/20 0154  NA 131* 127* 130* 126*   < > 127* 126* 128* 130* 132*  K 3.4* 3.3* 3.1* 4.2  --  4.4 4.1 4.5 4.1 4.6  CL 96* 94* 96* 94*  --  95* 95* 96* 94* 98  CO2 '26 23 25 25  ' --  '24 22 24 27 28  ' GLUCOSE 120* 115* 153* 115*  --  125* 140* 113* 274* 148*  BUN 6* 9 7* 10  --  '9 11 10 18 15  ' CREATININE 0.34* 0.37* 0.33* 0.35*  --  0.37* 0.32* <0.30* 0.33* 0.48  CALCIUM 8.4* 8.1* 7.9* 8.0*  --  8.1* 8.1* 8.3* 9.2 8.8*  MG 1.7 1.6* 2.0 1.8  --  1.8  --   --   --   --   PHOS 4.0  --   --   --   --   --   --   --   --   --    < > = values in this interval not displayed.    CBC: Recent Labs  Lab 12/10/20 0115 12/11/20 0259 12/12/20 0153 12/12/20 1200 12/13/20 1524 12/14/20 0132 12/15/20 0304 12/16/20 0154  WBC 8.3   < > 10.7*  --  9.5 9.6 5.9 12.9*  NEUTROABS 6.4  --   --   --   --   --   --   --   HGB 9.4*   < > 8.6* 9.0* 9.0* 9.0* 9.8* 8.9*  HCT 28.9*   < > 26.5* 28.1* 27.6* 28.9* 30.7* 29.0*  MCV 77.7*   < > 77.7*  --  78.2* 79.4* 77.5*  80.3  PLT 530*   < > 554*  --  504* 516* 560* 553*   < > = values in this interval not displayed.   Impression: Patient appears to be doing well on prednisone.  Suspect mild leukocytosis is reactive secondary to starting prednisone, no need for further work-up of this at this time  Recommendations:  Recommend the following taper of prednisone: - 36m x 2 weeks (3/10-3/23) THEN - 425mx 2 weeks THEN - 35 mg x 2 weeks THEN - 30 mg x 2 weeks  THEN - 25 mg x 2 weeks THEN - 20 mg and hold there pending follow-up with rheumatology; this taper may be modified by rheumatology per patient's clinical course and their expertise, potential for modification was discussed with family - Vitamin D, Calcium and protonix while on high dose steroids, may be discontinued if steroids are stopped (note patient is already on Vitamin D supplementation)  - TID AC  + QHS glucose checks a MDSSI while on high dose steroids inpatient. Appreciate primary team determination of glucose management on discharge - F/u Cytology from pleural fluid - Follow-up temporal artery biopsy results - Appreciate PT/OT evaluation for level of service is needed on discharge - Neurology is signing off at this time, please reconsult if new questions arise.  Patient advised to keep follow-up appointment with Dr. SaFelecia Shellingt this time.  SrLesleigh NoeD-PhD [facility] [phone] 7 AM to 7 PM, outside these hours please contact Neurologist on call listed on AMWolcotthan 35 minutes were spent at bedside today, reviewing work-up, plan and prognosis with daughter at bedside as well as examining the patient and reviewing relevant labs as detailed above.

## 2020-12-16 NOTE — Progress Notes (Signed)
    Subjective  - POD #1, s/p Bilateral temporal artery biopsies  No complaints this am   Physical Exam:  Both incision sites clean and dry       Assessment/Plan:  POD #1  Stable from our perspective Please call if we can be of further assistance   Mckenzie [name] 12/16/2020 9:24 AM --  Vitals:   12/15/20 2316 12/16/20 0631  BP: (!) 119/53 133/62  Pulse: 75 71  Resp:  17  Temp:  97.8 F (36.6 C)  SpO2:  96%    Intake/Output Summary (Last 24 hours) at 12/16/2020 0924 Last data filed at 12/15/2020 2000 Gross per 24 hour  Intake 550 ml  Output 610 ml  Net -60 ml     Laboratory CBC    Component Value Date/Time   WBC 12.9 (H) 12/16/2020 0154   HGB 8.9 (L) 12/16/2020 0154   HGB 8.7 (L) 12/06/2020 1341   HCT 29.0 (L) 12/16/2020 0154   HCT 28.4 (L) 12/06/2020 1341   PLT 553 (H) 12/16/2020 0154   PLT 666 (H) 12/06/2020 1341    BMET    Component Value Date/Time   NA 132 (L) 12/16/2020 0154   NA 126 (L) 12/06/2020 1341   K 4.6 12/16/2020 0154   CL 98 12/16/2020 0154   CO2 28 12/16/2020 0154   GLUCOSE 148 (H) 12/16/2020 0154   BUN 15 12/16/2020 0154   BUN 10 12/06/2020 1341   CREATININE 0.48 12/16/2020 0154   CREATININE 0.63 01/23/2018 0803   CALCIUM 8.8 (L) 12/16/2020 0154   GFRNONAA >60 12/16/2020 0154   GFRNONAA 90 01/23/2018 0803   GFRAA 120 12/01/2020 1127   GFRAA 104 01/23/2018 0803    COAG No results found for: INR, PROTIME No results found for: PTT  Antibiotics Anti-infectives (From admission, onward)   None       V. Leia Alf, M.D., [facility] [place] Office: [phone] Pager:  [phone]

## 2020-12-16 NOTE — Plan of Care (Signed)

## 2020-12-16 NOTE — Progress Notes (Signed)
PROGRESS NOTE    Mckenzie [name]  MRN:[id number] DOB: 01/27/1946 DOA: 12/07/2020 PCP: Joyce, Britney F, FNP     Brief Narrative:  Mckenzie Martinezis a 74 y.o.femalewith medical history significant of hypertension, hyperlipidemia, chronic anxiety/depression whopresents after being advised by her primary care provider to come in for low blood counts and low sodium levels. Patient's daughter is present at bedside and provides much of the patient's history. Apparently over the last 3 months patient has been dealing with weakness and confusion. Last hospitalized on 10/05/2020 after having a fall, status post open reduction internal fixation ofa leftdistal radial fracture.Since that hospitalization patient daughter noted that she has had a progressive decline and now needs assistance to complete pretty much all of her ADLs. It seems she was started on Lexapro 2 months prior for adjustment disorder with anxiety and depression. Daughter notes that the medication has been helping and would like to continue this medication. She has been followed by Dr. Salter of neurology for worsening confusion and weakness. Patient had been worked up for for her symptoms and had been told that she did not have dementia. Also reports patient having EMG studies that were negative. During this time patient sodium and hemoglobin levels have been trending down. No blood in stool, she has been on ferrous sulfate supplements. She had followed up with [facility] and was scheduled to have a colonoscopy on 12/11/20.  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   Her hemoglobin down trended to 7.4, iron studies suggestive of iron deficiency, negative FOBT.  She was seen by GI, she had an EGD and colonoscopy done on 12/09/2020 which findings did not explain her anemia.   Hematology/medical oncology recommended to optimize hemoglobin at least 9.0.  Hospital course complicated by persistent generalized weakness.   Inflammatory markers returned significantly elevated CRP 12.3, ESR 112 on 12/09/2020.   She had a CT cervical spine which revealed a 2 x 1 x 1 cm fluid collection at the C7.  She had an EEG on 12/11/20, no evidence of seizure.  CTA chest done 12/11/2020 revealed enlarging pericardial effusion, no evidence of pulmonary emboli, wall thickening noted in the descending thoracic aorta, enlarging left pleural effusion with underlying left lower lobe consolidation.  Patient underwent thoracentesis on 3/9.  New events last 24 hours / Subjective: Patient seen with daughter at bedside. She reports that patient's appetite and interactions have improved greatly since admission. Seems to have more energy and talkative. She voices no new complaints today.    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 Kadakia.  Agrees with prednisone use, plans to follow-up in 2 weeks.  Continue colchicine  Left pleural effusion -Status post thoracentesis   3/9. Technically transudative but pleural protein/serum protein is very borderline for Light's criteria. Fluid culture negative to date.  Cytology without  malignant cells noted.   C7 fluid collection -MR cervical spine: Enhancement and edema about the dorsal tips of the C7 and T1 spinous processes, with a superimposed 2 cm collection adjacent to the C7 spinous process. Findings are nonspecific, but can be seen in the setting of underlying inflammatory conditions including polymyalgia rheumatica. In this setting, the collection could reflect a small bursal collection. Possible infection would be the primary differential consideration, and could be considered in the correct clinical setting  -Patient without significant signs of systemic infection however with normal lactic acid, normal procalcitonin, normal temperature  Proximal muscle weakness -Has been worked up by Dr. Salter of neurology as an outpatient.  Question possible PMR diagnosis -Agree with steroids -S/p temporal artery biopsy 3/11, result pending   Anemia of chronic disease -Status post EGD, colonoscopy 3/5 without GI etiology of anemia.  GI has signed off -Appreciate hematology -Goal hemoglobin >9 -Stable   History of bilateral cerebellar CVA -Continue aspirin  Hyperlipidemia -Patient has not tolerated statins in the past  Hypertension -Continue Norvasc  Chronic diastolic heart failure -Without evidence of acute exacerbation at this time  Generalized weakness -PT OT  Severe protein calorie malnutrition -Estimated body mass index is 17.54 kg/m as calculated from the following:   Height as of this encounter: 5' 5" (1.651 m).   Weight as of this encounter: 47.8 kg.  Hyperglycemia -In setting of steroid use. Adjusted insulin    In agreement with assessment of the pressure ulcer as below:  Pressure Injury 12/08/20 Coccyx Medial Stage 1 -  Intact skin with non-blanchable redness of a localized area usually over a bony prominence. not open,  purple area (Active)  12/08/20 1834  Location: Coccyx  Location Orientation: Medial  Staging: Stage 1 -  Intact skin with  non-blanchable redness of a localized area usually over a bony prominence.  Wound Description (Comments): not open,  purple area  Present on Admission: Yes     Nutrition Problem: Moderate Malnutrition Etiology: chronic illness (HTN, hyperlipidemia)   DVT prophylaxis:  SCDs Start: 12/08/20 0856  Code Status: Full Family Communication: Daughter at bedside Disposition Plan:  Status is: Inpatient  Remains inpatient appropriate because:Ongoing diagnostic testing needed not appropriate for outpatient work up   Dispo:  Patient From: Home  Planned Disposition: Home with Health Care Svc  Medically stable for discharge: No.  Temporal artery biopsy result pending.  PT OT pending        Antimicrobials:  Anti-infectives (From admission, onward)   None       Objective: Vitals:   12/15/20 1627 12/15/20 2118 12/15/20 2316 12/16/20 0631  BP: (!) 133/58 (!) 125/49 (!) 119/53 133/62  Pulse: 74 82 75 71  Resp: 18 20  17  Temp: 98.3 F (36.8 C) 98 F (36.7 C)  97.8 F (36.6 C)  TempSrc: Oral Oral  Oral  SpO2:  98%  96%  Weight: 47.8 kg     Height: 5' 5" (1.651 m)       Intake/Output Summary (Last 24 hours) at 12/16/2020 0956 Last data filed at 12/15/2020 2000 Gross per 24 hour  Intake 550 ml  Output 610 ml  Net -60 ml   Filed Weights   12/15/20 1627  Weight: 47.8 kg    Examination: General exam: Appears calm and comfortable  Respiratory system: Clear to auscultation. Respiratory effort normal. Cardiovascular system:   S1 & S2 heard, RRR. No pedal edema. Gastrointestinal system: Abdomen is nondistended, soft and nontender. Normal bowel sounds heard. Central nervous system: Alert and oriented Extremities: Symmetric in appearance bilaterally  Skin: No rashes, lesions or ulcers on exposed skin  Psychiatry: Mood & affect appropriate.    Data Reviewed: I have personally reviewed following labs and imaging studies  CBC: Recent Labs  Lab 12/10/20 0115 12/11/20 0259  12/12/20 0153 12/12/20 1200 12/13/20 1524 12/14/20 0132 12/15/20 0304 12/16/20 0154  WBC 8.3   < > 10.7*  --  9.5 9.6 5.9 12.9*  NEUTROABS 6.4  --   --   --   --   --   --   --   HGB 9.4*   < > 8.6* 9.0* 9.0* 9.0* 9.8* 8.9*  HCT 28.9*   < > 26.5* 28.1* 27.6* 28.9* 30.7* 29.0*  MCV 77.7*   < > 77.7*  --  78.2* 79.4* 77.5* 80.3  PLT 530*   < > 554*  --  504* 516* 560* 553*   < > = values in this interval not displayed.   Basic Metabolic Panel: Recent Labs  Lab 12/10/20 0115 12/11/20 0243 12/11/20 1411 12/12/20 0153 12/12/20 1200 12/13/20 0252 12/13/20 1524 12/14/20 0132 12/15/20 0304 12/16/20 0154  NA 131* 127* 130* 126*   < > 127* 126* 128* 130* 132*  K 3.4* 3.3* 3.1* 4.2  --  4.4 4.1 4.5 4.1 4.6  CL 96* 94* 96* 94*  --  95* 95* 96* 94* 98  CO2 26 23 25 25  --  24 22 24 27 28  GLUCOSE 120* 115* 153* 115*  --  125* 140* 113* 274* 148*  BUN 6* 9 7* 10  --  9 11 10 18 15  CREATININE 0.34* 0.37* 0.33* 0.35*  --  0.37* 0.32* <0.30* 0.33* 0.48  CALCIUM 8.4* 8.1* 7.9* 8.0*  --  8.1* 8.1* 8.3* 9.2 8.8*  MG 1.7 1.6* 2.0 1.8  --  1.8  --   --   --   --   PHOS 4.0  --   --   --   --   --   --   --   --   --    < > = values in this interval not displayed.   GFR: Estimated Creatinine Clearance: 45.8 mL/min (by C-G formula based on SCr of 0.48 mg/dL). Liver Function Tests: Recent Labs  Lab 12/10/20 0115 12/13/20 1524  AST 16 25  ALT 33 31  ALKPHOS 108 105  BILITOT 1.2 0.2*  PROT 5.9* 5.9*  ALBUMIN 1.6* 1.6*   No results for input(s): LIPASE, AMYLASE in the last 168 hours. No results for input(s): AMMONIA in the last 168 hours. Coagulation Profile: No results for input(s): INR, PROTIME in the last 168 hours. Cardiac Enzymes: Recent Labs  Lab 12/10/20 0543  CKTOTAL <5*   BNP (last 3 results) No results for input(s): PROBNP in the last 8760 hours. HbA1C: Recent Labs    12/14/20 1014  HGBA1C 5.7*   CBG: Recent Labs  Lab 12/15/20 0746 12/15/20 1610  12/15/20 2114 12/15/20 2321 12/16/20 0811  GLUCAP 154* 138* 346* 268* 93   Lipid Profile: No results for input(s): CHOL, HDL, LDLCALC, TRIG, CHOLHDL, LDLDIRECT in the last 72 hours. Thyroid Function Tests: No results for input(s): TSH, T4TOTAL, FREET4, T3FREE, THYROIDAB in the last 72 hours. Anemia Panel: No results for input(s): VITAMINB12, FOLATE, FERRITIN, TIBC, IRON, RETICCTPCT in the last 72 hours. Sepsis   Labs: Recent Labs  Lab 12/10/20 0543  PROCALCITON <0.10  LATICACIDVEN 0.8    Recent Results (from the past 240 hour(s))  Resp Panel by RT-PCR (Flu A&B, Covid) Nasopharyngeal Swab     Status: None   Collection Time: 12/08/20  2:34 AM   Specimen: Nasopharyngeal Swab; Nasopharyngeal(NP) swabs in vial transport medium  Result Value Ref Range Status   SARS Coronavirus 2 by RT PCR NEGATIVE NEGATIVE Final    Comment: (NOTE) SARS-CoV-2 target nucleic acids are NOT DETECTED.  The SARS-CoV-2 RNA is generally detectable in upper respiratory specimens during the acute phase of infection. The lowest concentration of SARS-CoV-2 viral copies this assay can detect is 138 copies/mL. A negative result does not preclude SARS-Cov-2 infection and should not be used as the sole basis for treatment or other patient management decisions. A negative result may occur with  improper specimen collection/handling, submission of specimen other than nasopharyngeal swab, presence of viral mutation(s) within the areas targeted by this assay, and inadequate number of viral copies(<138 copies/mL). A negative result must be combined with clinical observations, patient history, and epidemiological information. The expected result is Negative.  Fact Sheet for Patients:  https://www.fda.gov/media/152166/download  Fact Sheet for Healthcare Providers:  https://www.fda.gov/media/152162/download  This test is no t yet approved or cleared by the [place] FDA and  has been authorized for detection  and/or diagnosis of SARS-CoV-2 by FDA under an Emergency Use Authorization (EUA). This EUA will remain  in effect (meaning this test can be used) for the duration of the COVID-19 declaration under Section 564(b)(1) of the Act, 21 U.S.C.section 360bbb-3(b)(1), unless the authorization is terminated  or revoked sooner.       Influenza A by PCR NEGATIVE NEGATIVE Final   Influenza B by PCR NEGATIVE NEGATIVE Final    Comment: (NOTE) The Xpert Xpress SARS-CoV-2/FLU/RSV plus assay is intended as an aid in the diagnosis of influenza from Nasopharyngeal swab specimens and should not be used as a sole basis for treatment. Nasal washings and aspirates are unacceptable for Xpert Xpress SARS-CoV-2/FLU/RSV testing.  Fact Sheet for Patients: https://www.fda.gov/media/152166/download  Fact Sheet for Healthcare Providers: https://www.fda.gov/media/152162/download  This test is not yet approved or cleared by the [place] FDA and has been authorized for detection and/or diagnosis of SARS-CoV-2 by FDA under an Emergency Use Authorization (EUA). This EUA will remain in effect (meaning this test can be used) for the duration of the COVID-19 declaration under Section 564(b)(1) of the Act, 21 U.S.C. section 360bbb-3(b)(1), unless the authorization is terminated or revoked.  Performed at [facility], [address] [place], [place] [address]   Culture, body fluid w Gram Stain-bottle     Status: None (Preliminary result)   Collection Time: 12/13/20  2:38 PM   Specimen: Fluid  Result Value Ref Range Status   Specimen Description FLUID PLEURAL LEFT  Final   Special Requests BOTTLES DRAWN AEROBIC AND ANAEROBIC  Final   Culture   Final    NO GROWTH 2 DAYS Performed at [facility], [address] [place], [place] [address]    Report Status PENDING  Incomplete  Gram stain     Status: None   Collection Time: 12/13/20  2:38 PM   Specimen: Fluid  Result Value Ref Range Status    Specimen Description FLUID PLEURAL LEFT  Final   Special Requests NONE  Final   Gram Stain   Final    WBC PRESENT, PREDOMINANTLY MONONUCLEAR NO ORGANISMS SEEN CYTOSPIN SMEAR Performed   at [facility], [place] [address] [place], [place] [address]    Report Status 12/13/2020 FINAL  Final      Radiology Studies: DG Chest 2 View  Result Date: 12/15/2020 CLINICAL DATA:  Pleural effusion. EXAM: CHEST - 2 VIEW COMPARISON:  Radiograph 12/13/2020.  CT 12/11/2020 FINDINGS: Small volume left pleural effusion best seen on the lateral view. Associated streaky atelectasis at the left lung base. No pneumothorax. Mild biapical pleuroparenchymal scarring. Mild cardiomegaly with unchanged mediastinal contours. No acute airspace disease. No pulmonary edema. Ossified intra-articular bodies in the left axillary recess. IMPRESSION: 1. Small volume left pleural effusion with streaky left basilar atelectasis. 2. Mild cardiomegaly. Electronically Signed   By: Keith Rake M.D.   On: 12/15/2020 18:47      Scheduled Meds: . amLODipine  2.5 mg Oral Daily  . aspirin EC  81 mg Oral Daily  . timolol  1 drop Both Eyes BID   And  . brimonidine  1 drop Both Eyes BID  . calcium-vitamin D  1 tablet Oral Daily  . colchicine  0.6 mg Oral Daily  . dorzolamide  1 drop Both Eyes BID  . escitalopram  20 mg Oral Daily  . feeding supplement  237 mL Oral TID BM  . ferrous sulfate  325 mg Oral Q breakfast  . fluorometholone  1 drop Right Eye BID  . insulin aspart  0-15 Units Subcutaneous TID WC  . insulin aspart  0-5 Units Subcutaneous QHS  . ketorolac  1 drop Right Eye Daily  . latanoprost  1 drop Both Eyes QHS  . multivitamin with minerals  1 tablet Oral Daily  . Netarsudil Dimesylate  1 drop Both Eyes QHS  . pantoprazole  40 mg Oral Daily  . prednisoLONE acetate  1 drop Left Eye Daily  . predniSONE  48 mg Oral Q breakfast  . sodium chloride flush  3 mL Intravenous Q12H  . sodium chloride  1 g Oral TID WC    Continuous Infusions:   LOS: 8 days      Time spent: 25 minutes   Dessa Phi, DO [facility] 12/16/2020, 9:56 AM   Available via Epic secure chat 7am-7pm After these hours, please refer to coverage provider listed on [email or web address]

## 2020-12-16 NOTE — Progress Notes (Signed)
Subjective:  Patient denies any chest pain or shortness of breath appetite has improved after starting steroids.  Objective:  Vital Signs in the last 24 hours: Temp:  [97.7 F (36.5 C)-98.4 F (36.9 C)] 97.7 F (36.5 C) (03/12 1146) Pulse Rate:  [71-83] 83 (03/12 1146) Resp:  [10-20] 20 (03/12 1146) BP: (119-135)/(49-68) 129/56 (03/12 1146) SpO2:  [94 %-99 %] 98 % (03/12 1146) Weight:  [47.8 kg] 47.8 kg (03/11 1627)  Intake/Output from previous day: 03/11 0701 - 03/12 0700 In: 550 [I.V.:500; IV Piggyback:50] Out: 610 [Urine:600; Blood:10] Intake/Output from this shift: No intake/output data recorded.  Physical Exam: Neck: no adenopathy, no carotid bruit, no JVD and supple, symmetrical, trachea midline Lungs: clear to auscultation bilaterally Heart: regular rate and rhythm, S1, S2 normal and 2/6 systolic murmur. Abdomen: soft, non-tender; bowel sounds normal; no masses,  no organomegaly Extremities: extremities normal, atraumatic, no cyanosis or edema  Lab Results: Recent Labs    12/15/20 0304 12/16/20 0154  WBC 5.9 12.9*  HGB 9.8* 8.9*  PLT 560* 553*   Recent Labs    12/15/20 0304 12/16/20 0154  NA 130* 132*  K 4.1 4.6  CL 94* 98  CO2 27 28  GLUCOSE 274* 148*  BUN 18 15  CREATININE 0.33* 0.48   No results for input(s): TROPONINI in the last 72 hours.  Invalid input(s): CK, MB Hepatic Function Panel Recent Labs    12/13/20 1524  PROT 5.9*  ALBUMIN 1.6*  AST 25  ALT 31  ALKPHOS 105  BILITOT 0.2*   No results for input(s): CHOL in the last 72 hours. No results for input(s): PROTIME in the last 72 hours.  Imaging: Imaging results have been reviewed and DG Chest 2 View  Result Date: 12/15/2020 CLINICAL DATA:  Pleural effusion. EXAM: CHEST - 2 VIEW COMPARISON:  Radiograph 12/13/2020.  CT 12/11/2020 FINDINGS: Small volume left pleural effusion best seen on the lateral view. Associated streaky atelectasis at the left lung base. No pneumothorax. Mild  biapical pleuroparenchymal scarring. Mild cardiomegaly with unchanged mediastinal contours. No acute airspace disease. No pulmonary edema. Ossified intra-articular bodies in the left axillary recess. IMPRESSION: 1. Small volume left pleural effusion with streaky left basilar atelectasis. 2. Mild cardiomegaly. Electronically Signed   By: Keith Rake M.D.   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

## 2020-12-17 DIAGNOSIS — E871 Hypo-osmolality and hyponatremia: Secondary | ICD-10-CM | POA: Diagnosis not present

## 2020-12-17 LAB — BASIC METABOLIC PANEL
Anion gap: 5 (ref 5–15)
BUN: 17 mg/dL (ref 8–23)
CO2: 30 mmol/L (ref 22–32)
Calcium: 8.7 mg/dL — ABNORMAL LOW (ref 8.9–10.3)
Chloride: 98 mmol/L (ref 98–111)
Creatinine, Ser: 0.38 mg/dL — ABNORMAL LOW (ref 0.44–1.00)
GFR, Estimated: >60 mL/min (ref >60)
Glucose, Bld: 144 mg/dL — ABNORMAL HIGH (ref 70–99)
Potassium: 4.1 mmol/L (ref 3.5–5.1)
Sodium: 133 mmol/L — ABNORMAL LOW (ref 135–145)

## 2020-12-17 LAB — CBC
HCT: 30.6 % — ABNORMAL LOW (ref 36.0–46.0)
Hemoglobin: 9.5 g/dL — ABNORMAL LOW (ref 12.0–15.0)
MCH: 24.7 pg — ABNORMAL LOW (ref 26.0–34.0)
MCHC: 31 g/dL (ref 30.0–36.0)
MCV: 79.7 fL — ABNORMAL LOW (ref 80.0–100.0)
Platelets: 585 10*3/uL — ABNORMAL HIGH (ref 150–400)
RBC: 3.84 MIL/uL — ABNORMAL LOW (ref 3.87–5.11)
RDW: 17.1 % — ABNORMAL HIGH (ref 11.5–15.5)
WBC: 11.1 10*3/uL — ABNORMAL HIGH (ref 4.0–10.5)
nRBC: 0 % (ref 0.0–0.2)

## 2020-12-17 LAB — GLUCOSE, CAPILLARY
Glucose-Capillary: 126 mg/dL — ABNORMAL HIGH (ref 70–99)
Glucose-Capillary: 110 mg/dL — ABNORMAL HIGH (ref 70–99)
Glucose-Capillary: 183 mg/dL — ABNORMAL HIGH (ref 70–99)
Glucose-Capillary: 218 mg/dL — ABNORMAL HIGH (ref 70–99)

## 2020-12-17 MED ORDER — SODIUM CHLORIDE 1 G PO TABS
1.0000 g | ORAL_TABLET | Freq: Three times a day (TID) | ORAL | Status: DC
  Administered 2020-12-17 – 2020-12-19 (×7): 1 g via ORAL
  Filled 2020-12-17 (×8): qty 1

## 2020-12-17 NOTE — Progress Notes (Signed)
PROGRESS NOTE    Mckenzie [name]  HUD:[id number] DOB: 04-03-1946 DOA: 12/07/2020 PCP: Loman Brooklyn, FNP     Brief Narrative:  Mckenzie Range Websteris a 75 y.o.femalewith medical history significant of hypertension, hyperlipidemia, chronic anxiety/depression whopresents after being advised by her primary care provider to come in for low blood counts and low sodium levels. Patient's daughter is present at bedside and provides much of the patient's history. Apparently over the last 3 months patient has been dealing with weakness and confusion. Last hospitalized on 10/05/2020 after having a fall, status post open reduction internal fixation ofa leftdistal radial fracture.Since that hospitalization patient daughter noted that she has had a progressive decline and now needs assistance to complete pretty much all of her ADLs. It seems she was started on Lexapro 2 months prior for adjustment disorder with anxiety and depression. Daughter notes that the medication has been helping and would like to continue this medication. She has been followed by Dr. Allyson Sabal of neurology for worsening confusion and weakness. Patient had been worked up for for her symptoms and had been told that she did not have dementia. Also reports patient having EMG studies that were negative. During this time patient sodium and hemoglobin levels have been trending down. No blood in stool, she has been on ferrous sulfate supplements. She had followed up with [facility] and was scheduled to have a colonoscopy on 12/11/20.  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   Her hemoglobin down trended to 7.4, iron studies suggestive of iron deficiency, negative FOBT.  She was seen by GI, she had an EGD and colonoscopy done on 12/09/2020 which findings did not explain her anemia.   Hematology/medical oncology recommended to optimize hemoglobin at least 9.0.  Hospital course complicated by persistent generalized weakness.   Inflammatory markers returned significantly elevated CRP 12.3, ESR 112 on 12/09/2020.   She had a CT cervical spine which revealed a 2 x 1 x 1 cm fluid collection at the C7.  She had an EEG on 12/11/20, no evidence of seizure.  CTA chest done 12/11/2020 revealed enlarging pericardial effusion, no evidence of pulmonary emboli, wall thickening noted in the descending thoracic aorta, enlarging left pleural effusion with underlying left lower lobe consolidation.  Patient underwent thoracentesis on 3/9.  Due to concern for temporal arteritis, patient underwent temporal artery biopsy with vascular surgery on 3/11.  New events last 24 hours / Subjective: Patient seen with daughter at bedside.  Patient doing well, has not been out of bed in numerous days.  Awaiting PT evaluation today.   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  Agrees with prednisone use, plans to follow-up in 2 weeks.  Continue  colchicine for 3 to 6 months  Left pleural effusion -Status post thoracentesis 3/9. Technically transudative but pleural protein/serum protein is very borderline for Light's  criteria. Fluid culture negative to date.  Cytology without malignant cells noted.   C7 fluid collection -MR cervical spine: Enhancement and edema about the dorsal tips of the C7 and T1 spinous processes, with a superimposed 2 cm collection adjacent to the C7 spinous process. Findings are nonspecific, but can be seen in the setting of underlying inflammatory conditions including polymyalgia rheumatica. In this setting, the collection could reflect a small bursal collection. Possible infection would be the primary differential consideration, and could be considered in the correct clinical setting  -Patient without significant signs of systemic infection however with normal lactic acid, normal procalcitonin, normal temperature  Proximal muscle weakness -Has been worked up by Dr. Allyson Sabal of neurology as an outpatient.  Question possible PMR diagnosis -Agree with steroids -S/p temporal artery biopsy 3/11, result pending   Anemia of chronic disease -Status post EGD, colonoscopy 3/5 without GI etiology of anemia.  GI has signed off -Appreciate hematology -Goal hemoglobin >9 -Stable   History of bilateral cerebellar CVA -Continue aspirin  Hyperlipidemia -Patient has not tolerated statins in the past  Hypertension -Continue Norvasc  Chronic diastolic heart failure -Without evidence of acute exacerbation at this time  Generalized weakness -PT OT  Severe protein calorie malnutrition -Estimated body mass index is 17.54 kg/m as calculated from the following:   Height as of this encounter: _0  (1.651 m).   Weight as of this encounter: 47.8 kg.  Hyperglycemia -In setting of steroid use.  Continue sliding scale insulin   In agreement with assessment of the pressure ulcer as below:  Pressure Injury 12/08/20 Coccyx Medial Stage 1 -  Intact skin with non-blanchable redness of a localized area usually over a bony prominence. not open,  purple area (Active)  12/08/20 1834  Location: Coccyx   Location Orientation: Medial  Staging: Stage 1 -  Intact skin with non-blanchable redness of a localized area usually over a bony prominence.  Wound Description (Comments): not open,  purple area  Present on Admission: Yes     Nutrition Problem: Moderate Malnutrition Etiology: chronic illness (HTN, hyperlipidemia)   DVT prophylaxis:  SCDs Start: 12/08/20 0856  Code Status: Full Family Communication: Daughter at bedside Disposition Plan:  Status is: Inpatient  Remains inpatient appropriate because:Ongoing diagnostic testing needed not appropriate for outpatient work up   Dispo:  Patient From: Home  Planned Disposition: Home with Health Care Svc  Medically stable for discharge: No.  Temporal artery biopsy result pending.  PT OT pending.  Hopeful discharge home 3/14        Antimicrobials:  Anti-infectives (From admission, onward)   None       Objective: Vitals:   12/16/20 2210 12/17/20 0432 12/17/20 0614 12/17/20 0701  BP: 140/62 (!) 162/83 (!) 161/73 (!) 162/68  Pulse: 89 79 75   Resp: 18 16    Temp: 97.6 F (36.4 C) 98.2 F (36.8 C)    TempSrc: Oral Oral    SpO2: 97% 98%    Weight:      Height:        Intake/Output Summary (Last 24 hours) at 12/17/2020 1006 Last data filed at 12/17/2020 0600 Gross per 24 hour  Intake -  Output 1900 ml  Net -1900 ml   Filed Weights   12/15/20 1627  Weight: 47.8 kg    Examination: General exam: Appears calm and  comfortable  Respiratory system: Clear to auscultation. Respiratory effort normal. Cardiovascular system: S1 & S2 heard, RRR. No pedal edema. Gastrointestinal system: Abdomen is nondistended, soft and nontender. Normal bowel sounds heard. Central nervous system: Alert and oriented. Non focal exam. Speech clear  Extremities: Symmetric in appearance bilaterally  Skin: No rashes, lesions or ulcers on exposed skin  Psychiatry: Stable  Data Reviewed: I have personally reviewed following labs and imaging  studies  CBC: Recent Labs  Lab 12/13/20 1524 12/14/20 0132 12/15/20 0304 12/16/20 0154 12/17/20 0501  WBC 9.5 9.6 5.9 12.9* 11.1*  HGB 9.0* 9.0* 9.8* 8.9* 9.5*  HCT 27.6* 28.9* 30.7* 29.0* 30.6*  MCV 78.2* 79.4* 77.5* 80.3 79.7*  PLT 504* 516* 560* 553* 415*   Basic Metabolic Panel: Recent Labs  Lab 12/11/20 0243 12/11/20 1411 12/12/20 0153 12/12/20 1200 12/13/20 0252 12/13/20 1524 12/14/20 0132 12/15/20 0304 12/16/20 0154 12/17/20 0501  NA 127* 130* 126*   < > 127* 126* 128* 130* 132* 133*  K 3.3* 3.1* 4.2  --  4.4 4.1 4.5 4.1 4.6 4.1  CL 94* 96* 94*  --  95* 95* 96* 94* 98 98  CO2 _0 --  _1 GLUCOSE 115* 153* 115*  --  125* 140* 113* 274* 148* 144*  BUN 9 7* 10  --  _2 CREATININE 0.37* 0.33* 0.35*  --  0.37* 0.32* <0.30* 0.33* 0.48 0.38*  CALCIUM 8.1* 7.9* 8.0*  --  8.1* 8.1* 8.3* 9.2 8.8* 8.7*  MG 1.6* 2.0 1.8  --  1.8  --   --   --   --   --    < > = values in this interval not displayed.   GFR: Estimated Creatinine Clearance: 45.8 mL/min (A) (by C-G formula based on SCr of 0.38 mg/dL (L)). Liver Function Tests: Recent Labs  Lab 12/13/20 1524  AST 25  ALT 31  ALKPHOS 105  BILITOT 0.2*  PROT 5.9*  ALBUMIN 1.6*   No results for input(s): LIPASE, AMYLASE in the last 168 hours. No results for input(s): AMMONIA in the last 168 hours. Coagulation Profile: No results for input(s): INR, PROTIME in the last 168 hours. Cardiac Enzymes: No results for input(s): CKTOTAL, CKMB, CKMBINDEX, TROPONINI in the last 168 hours. BNP (last 3 results) No results for input(s): PROBNP in the last 8760 hours. HbA1C: Recent Labs    12/14/20 1014  HGBA1C 5.7*   CBG: Recent Labs  Lab 12/16/20 0811 12/16/20 1147 12/16/20 1628 12/16/20 2104 12/17/20 0746  GLUCAP 93 133* 176* 182* 126*   Lipid Profile: No results for input(s): CHOL, HDL, LDLCALC, TRIG, CHOLHDL, LDLDIRECT in the last 72 hours. Thyroid Function Tests: No results  for input(s): TSH, T4TOTAL, FREET4, T3FREE, THYROIDAB in the last 72 hours. Anemia Panel: No results for input(s): VITAMINB12, FOLATE, FERRITIN, TIBC, IRON, RETICCTPCT in the last 72 hours. Sepsis Labs: No results for input(s): PROCALCITON, LATICACIDVEN in the last 168 hours.  Recent Results (from the past 240 hour(s))  Resp Panel by RT-PCR (Flu A&B, Covid) Nasopharyngeal Swab     Status: None   Collection Time: 12/08/20  2:34 AM   Specimen: Nasopharyngeal Swab; Nasopharyngeal(NP) swabs in vial transport medium  Result Value Ref Range Status   SARS Coronavirus 2 by RT PCR NEGATIVE NEGATIVE Final    Comment: (NOTE) SARS-CoV-2 target nucleic acids are NOT DETECTED.  The SARS-CoV-2 RNA is generally detectable in upper respiratory specimens during  the acute phase of infection. The lowest concentration of SARS-CoV-2 viral copies this assay can detect is 138 copies/mL. A negative result does not preclude SARS-Cov-2 infection and should not be used as the sole basis for treatment or other patient management decisions. A negative result may occur with  improper specimen collection/handling, submission of specimen other than nasopharyngeal swab, presence of viral mutation(s) within the areas targeted by this assay, and inadequate number of viral copies(<138 copies/mL). A negative result must be combined with clinical observations, patient history, and epidemiological information. The expected result is Negative.  Fact Sheet for Patients:  EntrepreneurPulse.com.au  Fact Sheet for Healthcare Providers:  IncredibleEmployment.be  This test is no t yet approved or cleared by the [place] FDA and  has been authorized for detection and/or diagnosis of SARS-CoV-2 by FDA under an Emergency Use Authorization (EUA). This EUA will remain  in effect (meaning this test can be used) for the duration of the COVID-19 declaration under Section 564(b)(1) of the Act,  21 U.S.C.section 360bbb-3(b)(1), unless the authorization is terminated  or revoked sooner.       Influenza A by PCR NEGATIVE NEGATIVE Final   Influenza B by PCR NEGATIVE NEGATIVE Final    Comment: (NOTE) The Xpert Xpress SARS-CoV-2/FLU/RSV plus assay is intended as an aid in the diagnosis of influenza from Nasopharyngeal swab specimens and should not be used as a sole basis for treatment. Nasal washings and aspirates are unacceptable for Xpert Xpress SARS-CoV-2/FLU/RSV testing.  Fact Sheet for Patients: EntrepreneurPulse.com.au  Fact Sheet for Healthcare Providers: IncredibleEmployment.be  This test is not yet approved or cleared by the [place] FDA and has been authorized for detection and/or diagnosis of SARS-CoV-2 by FDA under an Emergency Use Authorization (EUA). This EUA will remain in effect (meaning this test can be used) for the duration of the COVID-19 declaration under Section 564(b)(1) of the Act, 21 U.S.C. section 360bbb-3(b)(1), unless the authorization is terminated or revoked.  Performed at [facility], [place] [address] [place], [place] [address]   Culture, body fluid w Gram Stain-bottle     Status: None (Preliminary result)   Collection Time: 12/13/20  2:38 PM   Specimen: Fluid  Result Value Ref Range Status   Specimen Description FLUID PLEURAL LEFT  Final   Special Requests BOTTLES DRAWN AEROBIC AND ANAEROBIC  Final   Culture   Final    NO GROWTH 3 DAYS Performed at [facility], [place] [address] [place], [place] [address]    Report Status PENDING  Incomplete  Gram stain     Status: None   Collection Time: 12/13/20  2:38 PM   Specimen: Fluid  Result Value Ref Range Status   Specimen Description FLUID PLEURAL LEFT  Final   Special Requests NONE  Final   Gram Stain   Final    WBC PRESENT, PREDOMINANTLY MONONUCLEAR NO ORGANISMS SEEN CYTOSPIN SMEAR Performed at [facility], [place] [address] [place] [address] [place], [place] [address]    Report Status 12/13/2020 FINAL  Final      Radiology Studies: DG Chest 2 View  Result Date: 12/15/2020 CLINICAL DATA:  Pleural effusion. EXAM: CHEST - 2 VIEW COMPARISON:  Radiograph 12/13/2020.  CT 12/11/2020 FINDINGS: Small volume left pleural effusion best seen on the lateral view. Associated streaky atelectasis at the left lung base. No pneumothorax. Mild biapical pleuroparenchymal scarring. Mild cardiomegaly with unchanged mediastinal contours. No acute airspace disease. No pulmonary edema. Ossified intra-articular bodies in the left axillary  recess. IMPRESSION: 1. Small volume left pleural effusion with streaky left basilar atelectasis. 2. Mild cardiomegaly. Electronically Signed   By: Keith Rake M.D.   On: 12/15/2020 18:47      Scheduled Meds: . amLODipine  2.5 mg Oral Daily  . aspirin EC  81 mg Oral Daily  . timolol  1 drop Both Eyes BID   And  . brimonidine  1 drop Both Eyes BID  . calcium-vitamin D  1 tablet Oral Daily  . colchicine  0.6 mg Oral Daily  . dorzolamide  1 drop Both Eyes BID  . escitalopram  20 mg Oral Daily  . feeding supplement  237 mL Oral TID BM  . ferrous sulfate  325 mg Oral Q breakfast  . fluorometholone  1 drop Right Eye BID  . insulin aspart  0-15 Units Subcutaneous TID WC  . insulin aspart  0-5 Units Subcutaneous QHS  . ketorolac  1 drop Right Eye Daily  . latanoprost  1 drop Both Eyes QHS  . multivitamin with minerals  1 tablet Oral Daily  . Netarsudil Dimesylate  1 drop Both Eyes QHS  . pantoprazole  40 mg Oral Daily  . prednisoLONE acetate  1 drop Left Eye Daily  . predniSONE  48 mg Oral Q breakfast  . sodium chloride flush  3 mL Intravenous Q12H  . sodium chloride  1 g Oral TID WC   Continuous Infusions:   LOS: 9 days      Time spent: 25 minutes   Dessa Phi, DO [facility] 12/17/2020, 10:06 AM   Available via Epic secure chat 7am-7pm After these hours, please refer to coverage  provider listed on [email or web address]

## 2020-12-17 NOTE — Anesthesia Postprocedure Evaluation (Signed)
Anesthesia Post Note  Patient: Mckenzie [name]  Procedure(s) Performed: BILATERAL TEMPORAL ARTERY BIOPSIES (Bilateral Head)     Patient location during evaluation: PACU Anesthesia Type: General Level of consciousness: awake and alert and oriented Pain management: pain level controlled Vital Signs Assessment: post-procedure vital signs reviewed and stable Respiratory status: spontaneous breathing, nonlabored ventilation and respiratory function stable Cardiovascular status: blood pressure returned to baseline Postop Assessment: no apparent nausea or vomiting Anesthetic complications: no   No complications documented.          Brennan Bailey

## 2020-12-17 NOTE — Progress Notes (Signed)
Subjective:  Patient denies any chest pain or shortness of breath.  States overall feels better appetite has improved   Objective:  Vital Signs in the last 24 hours: Temp:  [97.6 F (36.4 C)-98.2 F (36.8 C)] 98.2 F (36.8 C) (03/13 0432) Pulse Rate:  [75-89] 75 (03/13 0614) Resp:  [16-20] 16 (03/13 0432) BP: (129-162)/(56-83) 162/68 (03/13 0701) SpO2:  [97 %-98 %] 98 % (03/13 0432)  Intake/Output from previous day: 03/12 0701 - 03/13 0700 In: -  Out: 1900 [Urine:1900] Intake/Output from this shift: No intake/output data recorded.  Physical Exam: Neck: no adenopathy, no carotid bruit, no JVD and supple, symmetrical, trachea midline Lungs: clear to auscultation bilaterally Heart: regular rate and rhythm, S1, S2 normal and 2/6 systolic murmur noted Abdomen: soft, non-tender; bowel sounds normal; no masses,  no organomegaly Extremities: extremities normal, atraumatic, no cyanosis or edema  Lab Results: Recent Labs    12/16/20 0154 12/17/20 0501  WBC 12.9* 11.1*  HGB 8.9* 9.5*  PLT 553* 585*   Recent Labs    12/16/20 0154 12/17/20 0501  NA 132* 133*  K 4.6 4.1  CL 98 98  CO2 28 30  GLUCOSE 148* 144*  BUN 15 17  CREATININE 0.48 0.38*   No results for input(s): TROPONINI in the last 72 hours.  Invalid input(s): CK, MB Hepatic Function Panel No results for input(s): PROT, ALBUMIN, AST, ALT, ALKPHOS, BILITOT, BILIDIR, IBILI in the last 72 hours. No results for input(s): CHOL in the last 72 hours. No results for input(s): PROTIME in the last 72 hours.  Imaging: Imaging results have been reviewed and DG Chest 2 View  Result Date: 12/15/2020 CLINICAL DATA:  Pleural effusion. EXAM: CHEST - 2 VIEW COMPARISON:  Radiograph 12/13/2020.  CT 12/11/2020 FINDINGS: Small volume left pleural effusion best seen on the lateral view. Associated streaky atelectasis at the left lung base. No pneumothorax. Mild biapical pleuroparenchymal scarring. Mild cardiomegaly with unchanged  mediastinal contours. No acute airspace disease. No pulmonary edema. Ossified intra-articular bodies in the left axillary recess. IMPRESSION: 1. Small volume left pleural effusion with streaky left basilar atelectasis. 2. Mild cardiomegaly. Electronically Signed   By: Keith Rake M.D.   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 Doylene Canard in 2 weeks  LOS: 9 days    Charolette Forward 12/17/2020, 10:27 AM

## 2020-12-17 NOTE — Evaluation (Addendum)
Physical Therapy Evaluation Patient Details Name: Mckenzie [name] MRN: [id number] DOB: 1946/02/06 Today's Date: 12/17/2020   History of Present Illness  Pt is a 75 y.o. female who presented to the hospital 12/08/20 after being advised by her PCP to come in for low blood counts and sodium levels. Of note, over past 3 months since her fall, hospitalziation, and s/p ORIF of her L distal radial fx she has progressively declined functionally and cognitively. Per chart, pt does not have dementia. EEG 12/11/2020 shows mild diffuse encephalopathy. Imaging of chest demonstrates pleural and pericardial effusions. Thoracentesis 12/13/20, Temporal artery biopsy 12/15/20.   PMH: HTN, depression, high cholesterol, and retinal micro-aneurysm of R eye.  Clinical Impression  Pt presents to PT with deficits in strength, power, balance, gait, functional mobility, cognition. Pt requires some physical assistance for transfers, as well as PT assistance for safety for all out of bed mobility. Pt tends to struggle to elevate into standing and to control descent into sitting due to LE power deficits. Pt does not report symptoms of dizziness this session. Pt will benefit from aggressive mobilization and acute PT POC to improve mobility quality and to reduce falls risk. PT has discussion with 2 of patient's daughters during session concerning discharge recommendations. Daughter's report that the patient required minA at baseline and the pt's spouse may be able to provide this support but want to confirm with him. If the pt's spouse is unable to provide minA for all out of bed mobility then the patient will benefit from SNF placement to improve safety while continuing to improve mobility quality. If the spouse is able to provide minA then the patient will benefit from HHPT as well as a manual wheelchair and a tub bench. PT also recommends the patient ambulate at least once this evening and once tomorrow morning with assistance of nursing  staff. In addition the patient will benefit from a PT session tomorrow to assess stair negotiation.  Patient suffers from systemic inflammation which impairs their ability to perform daily activities like ambulating in the home.  A walker alone will not resolve the issues with performing activities of daily living. A wheelchair will allow patient to safely perform daily activities.  The patient can self propel in the home or has a caregiver who can provide assistance.        Follow Up Recommendations SNF (family may elect to D/C home if spouse is able to provide minA for OOB mobility, if so then patient will benefit from [facility])    Equipment Recommendations  Wheelchair (measurements PT);Other (comment) (tub bench)    Recommendations for Other Services       Precautions / Restrictions Precautions Precautions: Fall Restrictions Weight Bearing Restrictions: No      Mobility  Bed Mobility Overal bed mobility: Needs Assistance Bed Mobility: Supine to Sit     Supine to sit: Supervision          Transfers Overall transfer level: Needs assistance Equipment used: Rolling walker (2 wheeled) Transfers: Sit to/from Stand Sit to Stand: Min assist;Min guard Stand pivot transfers: Min assist       General transfer comment: pt minA for 4 transfers, one transfer with minG, verbal cues for hand placement  Ambulation/Gait Ambulation/Gait assistance: Min guard Gait Distance (Feet): 50 Feet Assistive device: Rolling walker (2 wheeled) Gait Pattern/deviations: Step-to pattern Gait velocity: reduced Gait velocity interpretation: <1.31 ft/sec, indicative of household ambulator General Gait Details: pt with short step-to gait, reduced step length, PT verbal cues to  maintain BOS within walker during turns  Financial trader Rankin (Stroke Patients Only)       Balance Overall balance assessment: Needs assistance Sitting-balance support: No upper  extremity supported;Feet supported Sitting balance-Leahy Scale: Good     Standing balance support: Single extremity supported Standing balance-Leahy Scale: Poor Standing balance comment: reliant on UE support of RW                             Pertinent Vitals/Pain Pain Assessment: No/denies pain    Home Living Family/patient expects to be discharged to:: Private residence Living Arrangements: Spouse/significant other Available Help at Discharge: Family;Available 24 hours/day Type of Home: House Home Access: Stairs to enter Entrance Stairs-Rails: None Entrance Stairs-Number of Steps: 4 (plan to build ramp soon) Home Layout: One level Home Equipment: Shower seat;Grab bars - tub/shower;Bedside commode;Walker - standard;Grab bars - toilet;Toilet riser      Prior Function Level of Independence: Needs assistance   Gait / Transfers Assistance Needed: pt requires minA for transfers intermittently and minG-minA for household ambulation at baseline  ADL's / Homemaking Assistance Needed: family assist with all ADLs        Hand Dominance   Dominant Hand: Right    Extremity/Trunk Assessment   Upper Extremity Assessment Upper Extremity Assessment: Generalized weakness    Lower Extremity Assessment Lower Extremity Assessment: Generalized weakness    Cervical / Trunk Assessment Cervical / Trunk Assessment: Kyphotic  Communication   Communication: HOH  Cognition Arousal/Alertness: Awake/alert Behavior During Therapy: WFL for tasks assessed/performed Overall Cognitive Status: History of cognitive impairments - at baseline Area of Impairment: Memory;Problem solving;Safety/judgement                     Memory: Decreased short-term memory   Safety/Judgement: Decreased awareness of safety   Problem Solving: Slow processing        General Comments General comments (skin integrity, edema, etc.): VSS on RA, daughter present, other daughter on phone for  discharge recommendation discussion    Exercises     Assessment/Plan    PT Assessment Patient needs continued PT services  PT Problem List Decreased strength;Decreased activity tolerance;Decreased balance;Decreased mobility;Decreased knowledge of use of DME;Decreased safety awareness       PT Treatment Interventions DME instruction;Stair training;Gait training;Functional mobility training;Therapeutic activities;Balance training;Neuromuscular re-education;Patient/family education;Therapeutic exercise    PT Goals (Current goals can be found in the Care Plan section)  Acute Rehab PT Goals Patient Stated Goal: to improve mobility and reduce falls risk PT Goal Formulation: With patient/family Time For Goal Achievement: 12/31/20 Potential to Achieve Goals: Fair    Frequency Min 3X/week (may D/C home)   Barriers to discharge        Co-evaluation               AM-PAC PT "6 Clicks" Mobility  Outcome Measure Help needed turning from your back to your side while in a flat bed without using bedrails?: A Little Help needed moving from lying on your back to sitting on the side of a flat bed without using bedrails?: A Little Help needed moving to and from a bed to a chair (including a wheelchair)?: A Little Help needed standing up from a chair using your arms (e.g., wheelchair or bedside chair)?: A Little Help needed to walk in hospital room?: A Little Help needed climbing 3-5 steps  with a railing? : A Lot 6 Click Score: 17    End of Session   Activity Tolerance: Patient tolerated treatment well Patient left: in bed;with call bell/phone within reach;with family/visitor present;with bed alarm set Nurse Communication: Mobility status PT Visit Diagnosis: Unsteadiness on feet (R26.81);Muscle weakness (generalized) (M62.81)    Time: 1345-1440 PT Time Calculation (min) (ACUTE ONLY): 55 min   Charges:   PT Evaluation $PT Eval Moderate Complexity: 1 Mod PT Treatments $Gait  Training: 8-22 mins $Therapeutic Activity: 8-22 mins        Zenaida Niece, PT, DPT Acute Rehabilitation Pager: [phone]   Zenaida Niece 12/17/2020, 3:09 PM

## 2020-12-17 NOTE — Plan of Care (Signed)

## 2020-12-18 ENCOUNTER — Encounter (HOSPITAL_COMMUNITY): Payer: Self-pay | Admitting: Vascular Surgery

## 2020-12-18 ENCOUNTER — Ambulatory Visit: Payer: Medicare Other | Admitting: Licensed Clinical Social Worker

## 2020-12-18 DIAGNOSIS — E871 Hypo-osmolality and hyponatremia: Secondary | ICD-10-CM | POA: Diagnosis not present

## 2020-12-18 LAB — BASIC METABOLIC PANEL
Anion gap: 6 (ref 5–15)
BUN: 12 mg/dL (ref 8–23)
CO2: 29 mmol/L (ref 22–32)
Calcium: 8.7 mg/dL — ABNORMAL LOW (ref 8.9–10.3)
Chloride: 98 mmol/L (ref 98–111)
Creatinine, Ser: 0.36 mg/dL — ABNORMAL LOW (ref 0.44–1.00)
GFR, Estimated: >60 mL/min (ref >60)
Glucose, Bld: 121 mg/dL — ABNORMAL HIGH (ref 70–99)
Potassium: 3.6 mmol/L (ref 3.5–5.1)
Sodium: 133 mmol/L — ABNORMAL LOW (ref 135–145)

## 2020-12-18 LAB — GLUCOSE, CAPILLARY
Glucose-Capillary: 83 mg/dL (ref 70–99)
Glucose-Capillary: 113 mg/dL — ABNORMAL HIGH (ref 70–99)
Glucose-Capillary: 207 mg/dL — ABNORMAL HIGH (ref 70–99)
Glucose-Capillary: 144 mg/dL — ABNORMAL HIGH (ref 70–99)

## 2020-12-18 LAB — CBC
HCT: 33.4 % — ABNORMAL LOW (ref 36.0–46.0)
Hemoglobin: 10.3 g/dL — ABNORMAL LOW (ref 12.0–15.0)
MCH: 24.7 pg — ABNORMAL LOW (ref 26.0–34.0)
MCHC: 30.8 g/dL (ref 30.0–36.0)
MCV: 80.1 fL (ref 80.0–100.0)
Platelets: 598 10*3/uL — ABNORMAL HIGH (ref 150–400)
RBC: 4.17 MIL/uL (ref 3.87–5.11)
RDW: 17.1 % — ABNORMAL HIGH (ref 11.5–15.5)
WBC: 10.4 10*3/uL (ref 4.0–10.5)
nRBC: 0 % (ref 0.0–0.2)

## 2020-12-18 LAB — CULTURE, BODY FLUID W GRAM STAIN -BOTTLE: Culture: NO GROWTH

## 2020-12-18 LAB — SURGICAL PATHOLOGY

## 2020-12-18 LAB — MAGNESIUM: Magnesium: 1.9 mg/dL (ref 1.7–2.4)

## 2020-12-18 NOTE — TOC Progression Note (Deleted)
Transition of Care [facility]) - Progression Note    Patient Details  Name: Mckenzie [name] MRN: [id number] Date of Birth: 23-Feb-1946  [facility] CM/SW [place], RN Phone Number: [phone]  12/18/2020, 3:24 PM  Clinical Narrative:    Patient suffers from weakness which impairs their ability to perform daily activities like ADLs in the home.  A walking aid  will not resolve issue with performing activities of daily living. A wheelchair will allow patient to safely perform daily activities. Patient is not able to propel themselves in the home using a standard weight wheelchair due to weakness. Patient can self propel in the lightweight wheelchair. Length of need 12 months. Accessories: elevating leg rests (ELRs), wheel locks, extensions and anti-tippers.        Expected Discharge Plan and Services                           DME Arranged: 3-N-1,Walker rolling DME Agency: [employer] Date DME Agency Contacted: 12/12/20 Time DME Agency Contacted: [phone] Representative spoke with at DME Agency: Adela Lank             Social Determinants of Health ([place]) Interventions    Readmission Risk Interventions No flowsheet data found.

## 2020-12-18 NOTE — Progress Notes (Signed)
Physical Therapy Treatment Patient Details Name: Mckenzie [name] MRN: [id number] DOB: [birth year] Today's Date: 12/18/2020    History of Present Illness Pt is a 75 y.o. female who presented to the hospital 12/08/20 after being advised by her PCP to come in for low blood counts and sodium levels. Of note, over past 3 months since her fall, hospitalziation, and s/p ORIF of her L distal radial fx she has progressively declined functionally and cognitively. Per chart, pt does not have dementia. EEG 12/11/2020 shows mild diffuse encephalopathy. Imaging of chest demonstrates pleural and pericardial effusions. Thoracentesis 12/13/20, Temporal artery biopsy 12/15/20.   PMH: HTN, depression, high cholesterol, and retinal micro-aneurysm of R eye.    PT Comments    Pt required min assist transfers, min guard assist ambulation 100' with RW, and mod assist ascend/descend 3 steps with 1 handrail. Pt in recliner with feet elevated at end of session.   Pt's husband present during session. He is unsure if he wants ST SNF at d/c. If pt d/c's home, recommend HHPT and wheelchair. Pt does have 4 steps without rails to enter her home. Husband advised for pt to have 2-person assist to manage steps. Educated husband on technique for 2-person stair assist. He reports the family has plans to install a ramp, which will greatly improve pt's ability to enter/exit home.    Follow Up Recommendations  SNF;Supervision/Assistance - 24 hour     Equipment Recommendations  Wheelchair (measurements PT);Other (comment) (tub bench)    Recommendations for Other Services       Precautions / Restrictions Precautions Precautions: Fall;Other (comment) Precaution Comments: intermittent dizziness    Mobility  Bed Mobility Overal bed mobility: Needs Assistance Bed Mobility: Supine to Sit     Supine to sit: Supervision;HOB elevated     General bed mobility comments: +rail, increased time    Transfers Overall transfer level:  Needs assistance Equipment used: Rolling walker (2 wheeled) Transfers: Sit to/from Stand Sit to Stand: Min assist         General transfer comment: cues for hand placement and sequencing, assist to power up  Ambulation/Gait Ambulation/Gait assistance: Min guard Gait Distance (Feet): 100 Feet Assistive device: Rolling walker (2 wheeled) Gait Pattern/deviations: Step-through pattern;Decreased stride length;Narrow base of support Gait velocity: decreased Gait velocity interpretation: <1.31 ft/sec, indicative of household ambulator General Gait Details: cues to stay close to [employer] Stairs: Yes Stairs assistance: Mod assist Stair Management: One rail Right;Forwards;Step to pattern Number of Stairs: 3 General stair comments: decreased knee stability bilat during ascent requiring increased time to bring BLE into ext after each step   Wheelchair Mobility    Modified Rankin (Stroke Patients Only)       Balance Overall balance assessment: Needs assistance Sitting-balance support: No upper extremity supported;Feet supported Sitting balance-Leahy Scale: Good     Standing balance support: Bilateral upper extremity supported Standing balance-Leahy Scale: Poor Standing balance comment: reliant on UE support of RW                            Cognition Arousal/Alertness: Awake/alert Behavior During Therapy: WFL for tasks assessed/performed Overall Cognitive Status: History of cognitive impairments - at baseline                                        Exercises      General  Comments General comments (skin integrity, edema, etc.): VSS on RA. Pt's husband present during session.      Pertinent Vitals/Pain Pain Assessment: No/denies pain    Home Living                      Prior Function            PT Goals (current goals can now be found in the care plan section) Acute Rehab PT Goals Patient Stated Goal: to get  stronger Progress towards PT goals: Progressing toward goals    Frequency    Min 3X/week      PT Plan Current plan remains appropriate    Co-evaluation              AM-PAC PT "6 Clicks" Mobility   Outcome Measure  Help needed turning from your back to your side while in a flat bed without using bedrails?: A Little Help needed moving from lying on your back to sitting on the side of a flat bed without using bedrails?: A Little Help needed moving to and from a bed to a chair (including a wheelchair)?: A Little Help needed standing up from a chair using your arms (e.g., wheelchair or bedside chair)?: A Little Help needed to walk in hospital room?: A Little Help needed climbing 3-5 steps with a railing? : A Lot 6 Click Score: 17    End of Session Equipment Utilized During Treatment: Gait belt Activity Tolerance: Patient tolerated treatment well Patient left: in chair;with call bell/phone within reach;with chair alarm set;with family/visitor present Nurse Communication: Mobility status PT Visit Diagnosis: Unsteadiness on feet (R26.81);Muscle weakness (generalized) (M62.81)     Time: 1607-3710 PT Time Calculation (min) (ACUTE ONLY): 26 min  Charges:  $Gait Training: 23-37 mins                     Lorrin Goodell, [place]  Office # [phone] Pager [phone]    Lorriane Shire 12/18/2020, 1:04 PM

## 2020-12-18 NOTE — Progress Notes (Addendum)
PROGRESS NOTE    Mckenzie [name]  DTO:[id number] DOB: [birth year] DOA: 12/07/2020 PCP: Loman Brooklyn, FNP     Brief Narrative:  Mckenzie Range Websteris a 75 y.o.femalewith medical history significant of hypertension, hyperlipidemia, chronic anxiety/depression whopresents after being advised by her primary care provider to come in for low blood counts and low sodium levels. Patient's daughter is present at bedside and provides much of the patient's history. Apparently over the last 3 months patient has been dealing with weakness and confusion. Last hospitalized on 10/05/2020 after having a fall, status post open reduction internal fixation ofa leftdistal radial fracture.Since that hospitalization patient daughter noted that she has had a progressive decline and now needs assistance to complete pretty much all of her ADLs. It seems she was started on Lexapro 2 months prior for adjustment disorder with anxiety and depression. Daughter notes that the medication has been helping and would like to continue this medication. She has been followed by Dr. Allyson Sabal of neurology for worsening confusion and weakness. Patient had been worked up for for her symptoms and had been told that she did not have dementia. Also reports patient having EMG studies that were negative. During this time patient sodium and hemoglobin levels have been trending down. No blood in stool, she has been on ferrous sulfate supplements. She had followed up with [facility] and was scheduled to have a colonoscopy on 12/11/20.  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   Her hemoglobin down trended to 7.4, iron studies suggestive of iron deficiency, negative FOBT.  She was seen by GI, she had an EGD and colonoscopy done on 12/09/2020 which findings did not explain her anemia.   Hematology/medical oncology recommended to optimize hemoglobin at least 9.0.  Hospital course complicated by persistent generalized weakness.   Inflammatory markers returned significantly elevated CRP 12.3, ESR 112 on 12/09/2020.   She had a CT cervical spine which revealed a 2 x 1 x 1 cm fluid collection at the C7.  She had an EEG on 12/11/20, no evidence of seizure.  CTA chest done 12/11/2020 revealed enlarging pericardial effusion, no evidence of pulmonary emboli, wall thickening noted in the descending thoracic aorta, enlarging left pleural effusion with underlying left lower lobe consolidation.  Patient underwent thoracentesis on 3/9.  Due to concern for temporal arteritis, patient underwent temporal artery biopsy with vascular surgery on 3/11.  New events last 24 hours / Subjective: Patient seen with husband at bedside.  Husband has concerns regarding patient discharging too early before temporal artery biopsy results are back.  Concerns regarding prednisone dosing pending biopsy results, insulin use.  Awaiting to evaluate patient's ability to work with physical therapy again today to see if patient is more appropriate for SNF versus home with home health.  Reports that his daughter is still looking into getting rheumatology appointment, awaiting to hear back from PCPs rheumatology referral.   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  Agrees with prednisone use, plans to follow-up in 2 weeks.  Continue colchicine for 3 to 6 months  Left pleural effusion -Status post thoracentesis 3/9. Technically transudative but pleural protein/serum protein is very borderline for Light's criteria. Fluid culture negative to date.  Cytology without malignant cells noted.   C7 fluid collection -MR cervical spine: Enhancement and edema about the dorsal tips of the C7 and T1 spinous processes, with a superimposed 2 cm collection adjacent to the C7 spinous process. Findings are nonspecific, but can be seen in the setting of underlying inflammatory conditions including polymyalgia rheumatica. In this setting, the collection could reflect a small bursal collection. Possible infection would be the primary differential consideration, and could be considered in the correct clinical setting  -Patient without significant signs of systemic infection however with normal lactic acid, normal procalcitonin, normal temperature  Proximal muscle weakness -Has been worked up by Dr. Allyson Sabal of neurology as an outpatient.  Question possible PMR diagnosis -Agree with steroids -S/p temporal artery biopsy 3/11, result pending   Anemia of chronic disease -Status post EGD, colonoscopy 3/5 without GI etiology of anemia.  GI has signed off -Appreciate hematology -Goal hemoglobin >9 -Stable   History of bilateral cerebellar CVA -Continue aspirin  Hyperlipidemia -Patient has not tolerated statins in the past  Hypertension -Continue Norvasc  Chronic diastolic heart failure -Without evidence of acute exacerbation at this time  Generalized weakness -PT OT  Severe protein calorie malnutrition -Estimated body mass index is 17.54 kg/m as calculated from the following:   Height as of this encounter: '5\' 5"'  (1.651 m).   Weight as of  this encounter: 47.8 kg.  Hyperglycemia -In setting of steroid use.  Continue sliding scale insulin   In agreement with assessment of the pressure ulcer as below:  Pressure Injury 12/08/20 Coccyx Medial Stage 1 -  Intact skin with non-blanchable redness of a localized area usually over a bony prominence. not open,  purple area (Active)  12/08/20 1834  Location: Coccyx  Location Orientation: Medial  Staging: Stage 1 -  Intact skin with non-blanchable redness of a localized area usually over a bony prominence.  Wound Description (Comments): not open,  purple area  Present on Admission: Yes     Nutrition Problem: Moderate Malnutrition Etiology: chronic illness (HTN, hyperlipidemia)   DVT prophylaxis:  SCDs Start: 12/08/20 0856  Code Status: Full Family Communication: Husband at bedside Disposition Plan:  Status is: Inpatient  Remains inpatient appropriate because:Ongoing diagnostic testing needed not appropriate for outpatient work up   Dispo:  Patient From: Home  Planned Disposition: Home with Health Care Svc  Medically stable for discharge: No.  Temporal artery biopsy result pending.         Antimicrobials:  Anti-infectives (From admission, onward)   None       Objective: Vitals:   12/17/20 0701 12/17/20 1447 12/17/20 2001 12/18/20 0810  BP: (!) 162/68 (!) 145/75 (!) 152/56 (!) 165/73  Pulse:  74 76 75  Resp:  '17 18 16  ' Temp:  97.7 F (36.5 C) 97.8 F (36.6 C) 97.8 F (36.6 C)  TempSrc:      SpO2:  97% 98% 98%  Weight:      Height:  Intake/Output Summary (Last 24 hours) at 12/18/2020 1031 Last data filed at 12/18/2020 0500 Gross per 24 hour  Intake --  Output 600 ml  Net -600 ml   Filed Weights   12/15/20 1627  Weight: 47.8 kg    Examination: General exam: Appears calm and comfortable  Respiratory system: Clear to auscultation. Respiratory effort normal. Cardiovascular system: S1 & S2 heard, RRR. No pedal edema. Gastrointestinal  system: Abdomen is nondistended, soft and nontender. Normal bowel sounds heard. Central nervous system: Alert and oriented. Non focal exam. Speech clear  Extremities: Symmetric in appearance bilaterally  Skin: No rashes, lesions or ulcers on exposed skin  Psychiatry: Judgement and insight appear stable. Mood & affect appropriate.    Data Reviewed: I have personally reviewed following labs and imaging studies  CBC: Recent Labs  Lab 12/14/20 0132 12/15/20 0304 12/16/20 0154 12/17/20 0501 12/18/20 0328  WBC 9.6 5.9 12.9* 11.1* 10.4  HGB 9.0* 9.8* 8.9* 9.5* 10.3*  HCT 28.9* 30.7* 29.0* 30.6* 33.4*  MCV 79.4* 77.5* 80.3 79.7* 80.1  PLT 516* 560* 553* 585* 389*   Basic Metabolic Panel: Recent Labs  Lab 12/11/20 1411 12/12/20 0153 12/12/20 1200 12/13/20 0252 12/13/20 1524 12/14/20 0132 12/15/20 0304 12/16/20 0154 12/17/20 0501 12/18/20 0328  NA 130* 126*   < > 127*   < > 128* 130* 132* 133* 133*  K 3.1* 4.2  --  4.4   < > 4.5 4.1 4.6 4.1 3.6  CL 96* 94*  --  95*   < > 96* 94* 98 98 98  CO2 25 25  --  24   < > '24 27 28 30 29  ' GLUCOSE 153* 115*  --  125*   < > 113* 274* 148* 144* 121*  BUN 7* 10  --  9   < > '10 18 15 17 12  ' CREATININE 0.33* 0.35*  --  0.37*   < > <0.30* 0.33* 0.48 0.38* 0.36*  CALCIUM 7.9* 8.0*  --  8.1*   < > 8.3* 9.2 8.8* 8.7* 8.7*  MG 2.0 1.8  --  1.8  --   --   --   --   --  1.9   < > = values in this interval not displayed.   GFR: Estimated Creatinine Clearance: 45.8 mL/min (A) (by C-G formula based on SCr of 0.36 mg/dL (L)). Liver Function Tests: Recent Labs  Lab 12/13/20 1524  AST 25  ALT 31  ALKPHOS 105  BILITOT 0.2*  PROT 5.9*  ALBUMIN 1.6*   No results for input(s): LIPASE, AMYLASE in the last 168 hours. No results for input(s): AMMONIA in the last 168 hours. Coagulation Profile: No results for input(s): INR, PROTIME in the last 168 hours. Cardiac Enzymes: No results for input(s): CKTOTAL, CKMB, CKMBINDEX, TROPONINI in the last 168  hours. BNP (last 3 results) No results for input(s): PROBNP in the last 8760 hours. HbA1C: No results for input(s): HGBA1C in the last 72 hours. CBG: Recent Labs  Lab 12/17/20 0746 12/17/20 1156 12/17/20 1601 12/17/20 2003 12/18/20 0812  GLUCAP 126* 110* 183* 218* 83   Lipid Profile: No results for input(s): CHOL, HDL, LDLCALC, TRIG, CHOLHDL, LDLDIRECT in the last 72 hours. Thyroid Function Tests: No results for input(s): TSH, T4TOTAL, FREET4, T3FREE, THYROIDAB in the last 72 hours. Anemia Panel: No results for input(s): VITAMINB12, FOLATE, FERRITIN, TIBC, IRON, RETICCTPCT in the last 72 hours. Sepsis Labs: No results for input(s): PROCALCITON, LATICACIDVEN in the last 168 hours.  Recent  Results (from the past 240 hour(s))  Culture, body fluid w Gram Stain-bottle     Status: None   Collection Time: 12/13/20  2:38 PM   Specimen: Fluid  Result Value Ref Range Status   Specimen Description FLUID PLEURAL LEFT  Final   Special Requests BOTTLES DRAWN AEROBIC AND ANAEROBIC  Final   Culture   Final    NO GROWTH 5 DAYS Performed at [facility], [place] [address], [place], [place] [address]    Report Status 12/18/2020 FINAL  Final  Gram stain     Status: None   Collection Time: 12/13/20  2:38 PM   Specimen: Fluid  Result Value Ref Range Status   Specimen Description FLUID PLEURAL LEFT  Final   Special Requests NONE  Final   Gram Stain   Final    WBC PRESENT, PREDOMINANTLY MONONUCLEAR NO ORGANISMS SEEN CYTOSPIN SMEAR Performed at [facility], [place] [address] [place], [place] [address]    Report Status 12/13/2020 FINAL  Final      Radiology Studies: No results found.    Scheduled Meds: . amLODipine  2.5 mg Oral Daily  . aspirin EC  81 mg Oral Daily  . timolol  1 drop Both Eyes BID   And  . brimonidine  1 drop Both Eyes BID  . calcium-vitamin D  1 tablet Oral Daily  . colchicine  0.6 mg Oral Daily  . dorzolamide  1 drop Both Eyes BID  . escitalopram  20  mg Oral Daily  . feeding supplement  237 mL Oral TID BM  . ferrous sulfate  325 mg Oral Q breakfast  . fluorometholone  1 drop Right Eye BID  . insulin aspart  0-15 Units Subcutaneous TID WC  . insulin aspart  0-5 Units Subcutaneous QHS  . ketorolac  1 drop Right Eye Daily  . latanoprost  1 drop Both Eyes QHS  . multivitamin with minerals  1 tablet Oral Daily  . Netarsudil Dimesylate  1 drop Both Eyes QHS  . pantoprazole  40 mg Oral Daily  . prednisoLONE acetate  1 drop Left Eye Daily  . predniSONE  48 mg Oral Q breakfast  . sodium chloride flush  3 mL Intravenous Q12H  . sodium chloride  1 g Oral TID WC   Continuous Infusions:   LOS: 10 days      Time spent: 25 minutes   Dessa Phi, DO [facility] 12/18/2020, 10:31 AM   Available via Epic secure chat 7am-7pm After these hours, please refer to coverage provider listed on [email or web address]

## 2020-12-19 ENCOUNTER — Telehealth: Payer: Self-pay | Admitting: Neurology

## 2020-12-19 ENCOUNTER — Other Ambulatory Visit: Payer: Self-pay | Admitting: Internal Medicine

## 2020-12-19 DIAGNOSIS — E871 Hypo-osmolality and hyponatremia: Secondary | ICD-10-CM | POA: Diagnosis not present

## 2020-12-19 LAB — BASIC METABOLIC PANEL
Anion gap: 7 (ref 5–15)
BUN: 22 mg/dL (ref 8–23)
CO2: 29 mmol/L (ref 22–32)
Calcium: 8.4 mg/dL — ABNORMAL LOW (ref 8.9–10.3)
Chloride: 99 mmol/L (ref 98–111)
Creatinine, Ser: 0.41 mg/dL — ABNORMAL LOW (ref 0.44–1.00)
GFR, Estimated: >60 mL/min (ref >60)
Glucose, Bld: 121 mg/dL — ABNORMAL HIGH (ref 70–99)
Potassium: 3.6 mmol/L (ref 3.5–5.1)
Sodium: 135 mmol/L (ref 135–145)

## 2020-12-19 LAB — CBC
HCT: 31.3 % — ABNORMAL LOW (ref 36.0–46.0)
Hemoglobin: 9.7 g/dL — ABNORMAL LOW (ref 12.0–15.0)
MCH: 24.9 pg — ABNORMAL LOW (ref 26.0–34.0)
MCHC: 31 g/dL (ref 30.0–36.0)
MCV: 80.3 fL (ref 80.0–100.0)
Platelets: 558 10*3/uL — ABNORMAL HIGH (ref 150–400)
RBC: 3.9 MIL/uL (ref 3.87–5.11)
RDW: 17.7 % — ABNORMAL HIGH (ref 11.5–15.5)
WBC: 11.3 10*3/uL — ABNORMAL HIGH (ref 4.0–10.5)
nRBC: 0 % (ref 0.0–0.2)

## 2020-12-19 LAB — GLUCOSE, CAPILLARY
Glucose-Capillary: 84 mg/dL (ref 70–99)
Glucose-Capillary: 162 mg/dL — ABNORMAL HIGH (ref 70–99)

## 2020-12-19 LAB — VITAMIN D 25 HYDROXY (VIT D DEFICIENCY, FRACTURES): Vit D, 25-Hydroxy: 36.27 ng/mL (ref 30–100)

## 2020-12-19 MED ORDER — PANTOPRAZOLE SODIUM 40 MG PO TBEC: 40.0000 mg | DELAYED_RELEASE_TABLET | Freq: Every day | ORAL | 0 refills | Status: DC

## 2020-12-19 MED ORDER — ASPIRIN 81 MG PO TBEC: 81.0000 mg | DELAYED_RELEASE_TABLET | Freq: Every day | ORAL | 0 refills | Status: DC

## 2020-12-19 MED ORDER — COLCHICINE 0.6 MG PO TABS: 0.6000 mg | ORAL_TABLET | Freq: Every day | ORAL | 0 refills | Status: DC

## 2020-12-19 MED ORDER — AMLODIPINE BESYLATE 2.5 MG PO TABS: 2.5000 mg | ORAL_TABLET | Freq: Every day | ORAL | 0 refills | Status: DC

## 2020-12-19 MED ORDER — BLOOD GLUCOSE METER KIT: PACK | 0 refills | Status: DC

## 2020-12-19 MED ORDER — INSULIN PEN NEEDLE 31G X 5 MM MISC: 0 refills | Status: DC

## 2020-12-19 MED ORDER — SODIUM CHLORIDE 1 G PO TABS: 1.0000 g | ORAL_TABLET | Freq: Three times a day (TID) | ORAL | 0 refills | Status: AC

## 2020-12-19 MED ORDER — INSULIN STARTER KIT- PEN NEEDLES (ENGLISH)
1.0000 | Freq: Once | Status: AC
  Administered 2020-12-19: 1
  Filled 2020-12-19 (×3): qty 1

## 2020-12-19 MED ORDER — INSULIN ASPART 100 UNIT/ML FLEXPEN: PEN_INJECTOR | SUBCUTANEOUS | 0 refills | Status: DC

## 2020-12-19 MED ORDER — PREDNISONE 10 MG PO TABS: ORAL_TABLET | ORAL | 0 refills | Status: DC

## 2020-12-19 NOTE — Telephone Encounter (Signed)
Lavella Lemons from [facility] called stating that the pt is going to be released from the hospital today and the hospital is also requesting Nursing and OT. Lavella Lemons would like to know if provider will follow these orders. Please advise.

## 2020-12-19 NOTE — Progress Notes (Signed)
Occupational Therapy Treatment Patient Details Name: Mckenzie [name] MRN: [id number] DOB: 07-Jun-1946 Today's Date: 12/19/2020    History of present illness Pt is a 75 y.o. female who presented to the hospital 12/08/20 after being advised by her PCP to come in for low blood counts and sodium levels. Of note, over past 3 months since her fall, hospitalziation, and s/p ORIF of her L distal radial fx she has progressively declined functionally and cognitively. Per chart, pt does not have dementia. EEG 12/11/2020 shows mild diffuse encephalopathy. Imaging of chest demonstrates pleural and pericardial effusions. Thoracentesis 12/13/20, Temporal artery biopsy 12/15/20.   PMH: HTN, depression, high cholesterol, and retinal micro-aneurysm of R eye.   OT comments  Pt making steady progress towards OT goals this session. Session focus on BADL reeducation with pt needing supervision for UB ADLs and MIN A for LB ADLs, MIN A for functional mobility with HHA and MAX A for pericare d/t cleanliness. Cognition and impaired safety awareness seem to be pts most limiting factor. Pt likely to DC home today with 24/7 supervision from pts husband who was present during session. Pt would continue to benefit from skilled occupational therapy while admitted and after d/c to address the below listed limitations in order to improve overall functional mobility and facilitate independence with BADL participation. DC plan remains appropriate, will follow acutely per POC.     Follow Up Recommendations  Home health OT;Supervision/Assistance - 24 hour    Equipment Recommendations  3 in 1 bedside commode;Other (comment) (RW)    Recommendations for Other Services      Precautions / Restrictions Precautions Precautions: Fall Restrictions Weight Bearing Restrictions: No       Mobility Bed Mobility Overal bed mobility: Needs Assistance Bed Mobility: Supine to Sit     Supine to sit: Supervision;HOB elevated     General bed  mobility comments: +rail, increased time    Transfers Overall transfer level: Needs assistance Equipment used: 1 person hand held assist Transfers: Sit to/from Stand Sit to Stand: Min guard;Min assist         General transfer comment: pt completing multiple sit<>stands during session with pt needing up to MIN HHA for steadying assist, min guard from toilet with use of grab bars    Balance Overall balance assessment: Needs assistance Sitting-balance support: No upper extremity supported;Feet supported Sitting balance-Leahy Scale: Good Sitting balance - Comments: reaching out of BOS for LB ADLs   Standing balance support: No upper extremity supported;During functional activity Standing balance-Leahy Scale: Fair Standing balance comment: able to stand at sink for grooming tasks with no UE support and close supervision                           ADL either performed or assessed with clinical judgement   ADL Overall ADL's : Needs assistance/impaired     Grooming: Wash/dry hands;Standing;Supervision/safety       Lower Body Bathing: Maximal assistance;Sit to/from stand Lower Body Bathing Details (indicate cue type and reason): simulated via posterior pericare in standing, MAX A for cleanliness Upper Body Dressing : Supervision/safety;Set up;Sitting Upper Body Dressing Details (indicate cue type and reason): to don [facility] shirt Lower Body Dressing: Minimal assistance;Sit to/from stand Lower Body Dressing Details (indicate cue type and reason): light MIN A to thread pants from EOB Toilet Transfer: Ambulation;Minimal assistance Toilet Transfer Details (indicate cue type and reason): MIN HHA to ambulate into BR for toileting, cues for safety awareness and steadying  assist Toileting- Clothing Manipulation and Hygiene: Maximal assistance;Sit to/from stand Toileting - Clothing Manipulation Details (indicate cue type and reason): MAX A for cleanliness via sit<>stand   Tub/Shower  Transfer Details (indicate cue type and reason): pt reports tub bench at home Functional mobility during ADLs: Minimal assistance General ADL Comments: pt continues to present with cognitive deficits, impaired balance and decreased activity tolerance     Vision       Perception     Praxis      Cognition Arousal/Alertness: Awake/alert Behavior During Therapy: WFL for tasks assessed/performed Overall Cognitive Status: History of cognitive impairments - at baseline                                 General Comments: pt following commands with increased time, impaired safety awareness noted        Exercises     Shoulder Instructions       General Comments Pt's husband present during session, education provided to pts husband on where family could obtain transport chair for community appointments.     Pertinent Vitals/ Pain       Pain Assessment: No/denies pain  Home Living                                          Prior Functioning/Environment              Frequency  Min 2X/week        Progress Toward Goals  OT Goals(current goals can now be found in the care plan section)  Progress towards OT goals: Progressing toward goals  Acute Rehab OT Goals Patient Stated Goal: to go home and wash her hair OT Goal Formulation: With patient Time For Goal Achievement: 12/26/20 Potential to Achieve Goals: Good  Plan Discharge plan remains appropriate;Frequency remains appropriate    Co-evaluation                 AM-PAC OT "6 Clicks" Daily Activity     Outcome Measure   Help from another person eating meals?: None Help from another person taking care of personal grooming?: A Little Help from another person toileting, which includes using toliet, bedpan, or urinal?: A Little Help from another person bathing (including washing, rinsing, drying)?: A Lot Help from another person to put on and taking off regular upper body clothing?:  A Little Help from another person to put on and taking off regular lower body clothing?: A Little 6 Click Score: 18    End of Session    OT Visit Diagnosis: Unsteadiness on feet (R26.81);Other abnormalities of gait and mobility (R26.89);Muscle weakness (generalized) (M62.81);History of falling (Z91.81);Other symptoms and signs involving cognitive function   Activity Tolerance Patient tolerated treatment well   Patient Left in bed;with call bell/phone within reach;with family/visitor present;Other (comment) (sitting EOB)   Nurse Communication Mobility status;Other (comment) (dressed and ready for DC)        Time: 4765-4650 OT Time Calculation (min): 30 min  Charges: OT General Charges $OT Visit: 1 Visit OT Treatments $Self Care/Home Management : 23-37 mins  Harley Alto., COTA/L Acute Rehabilitation Services [phone]    Precious Haws 12/19/2020, 4:21 PM

## 2020-12-19 NOTE — Discharge Summary (Signed)
Physician Discharge Summary  Mckenzie [name] MLJ:[id number] DOB: June 01, 1946 DOA: 12/07/2020  PCP: Loman Brooklyn, FNP  Admit date: 12/07/2020 Discharge date: 12/19/2020  Admitted From: Home Disposition:  Home with home health   Recommendations for Outpatient Follow-up:  1. Follow up with PCP in 1 week 2. Follow-up with ophthalmology as scheduled 3/18  3. Follow-up with rheumatology Dr. Vernelle Emerald 3/29 4. Follow-up with cardiology Dr. Doylene Canard in 2 weeks 5. Follow up with neurology Dr. Felecia Shelling 4/19  6. Would recommend repeat MRI cervical spine to check on C7 fluid collection as an outpatient 7. Would recommend repeat echocardiogram to check pericardial effusion 8. Continue to monitor hemoglobin and sodium levels as outpatient 9. Patient was started on insulin due to hyperglycemia in setting of prednisone use.  This can be further titrated down as she is weaned off steroids 10. Recommend the following taper of prednisone: - 47m x 2 weeks (3/10-3/23) THEN - 463mx 2 weeks THEN - 35 mg x 2 weeks THEN - 30 mg x 2 weeks  THEN - 25 mg x 2 weeks THEN - 20 mg and hold there pending follow-up with rheumatology; this taper may be modified by rheumatology per patient's clinical course and their expertise  Discharge Condition: Stable, improved CODE STATUS: Full code Diet recommendation: Regular diet  Brief/Interim Summary: Mckenzie J Websteris a [age].o.femalewith medical history significant of hypertension, hyperlipidemia,chronic anxiety/depression whopresents after being advised by her primary care provider to come in for low blood counts and low sodium levels. Patient's daughter is present at bedside and provides much of the patient's history. Apparently over the last 3 months patient has been dealing with weakness and confusion. Last hospitalized on 10/05/2020 after having a fall, status post open reduction internal fixation ofa leftdistal radial fracture.Since that  hospitalization patient daughter noted that she has had a progressive decline and now needs assistance to complete pretty much all of her ADLs. It seems she was started on Lexapro 2 months prior for adjustment disorder with anxiety and depression. Daughter notes that the medication has been helping and would like to continue this medication. She has been followed by Dr. SaAllyson Sabalf neurology for worsening confusion and weakness. Patient had been worked up for for her symptoms and had been told that she did not have dementia. Also reports patient having EMG studies that were negative. During this time patient sodium and hemoglobin levels have been trending down. No blood in stool, she has been on ferrous sulfate supplements. She had followed up with [facility] and was scheduled to have a colonoscopy on 12/11/20. 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   Her hemoglobin down trended to 7.4, iron studies suggestive of iron deficiency, negative FOBT. She was seen by GI, she had an EGD and colonoscopy done on 12/09/2020 which findings did not explain her anemia. Hematology/medical oncology recommended to optimize hemoglobin at least 9.0.  Hospital course complicated by persistent generalized weakness.   Inflammatory markers returned significantly elevated CRP 12.3, ESR 112 on 12/09/2020.   She had a CT cervical spine which revealed a 2 x 1 x 1 cm fluid collection at the C7. She had an EEGon 12/11/20, no evidence of seizure.  CTA chest done 12/11/2020 revealed enlarging pericardial effusion, no evidence of pulmonary emboli, wall thickening noted in the descending thoracic aorta, enlarging left pleural effusion with underlying left lower lobe  consolidation.  Patient underwent thoracentesis on 3/9.  Fluid study was transudative.  Cytology negative for medical  malignant cells  Due to concern for temporal arteritis, patient underwent temporal artery biopsy with vascular surgery on 3/11.  Patient was started on empiric steroids.  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  She had clinical improvement.  She was much more interactive, appetite improve, strength improved as well prior to discharge home.  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  Agrees with prednisone use, plans to follow-up in 2 weeks.  Continue colchicine for 3 to 6 months  Left pleural effusion -Status post thoracentesis 3/9. Technically transudative but pleural protein/serum protein is very borderline for Light's criteria. Fluid culture negative to date.  Cytology without malignant cells noted.   C7 fluid collection -MR cervical spine: Enhancement and edema about the dorsal tips of the C7 and T1 spinous processes, with a superimposed 2 cm collection adjacent to the C7 spinous process. Findings are nonspecific, but can be seen in the setting of underlying inflammatory conditions including polymyalgia rheumatica. In this setting, the collection could reflect a small bursal collection. Possible infection would be the primary differential consideration, and could be  considered in the correct clinical setting  -Patient without significant signs of systemic infection however with normal lactic acid, normal procalcitonin, normal temperature  Proximal muscle weakness -Has been worked up by Dr. SAllyson Sabalof neurology as an outpatient.  Question possible PMR diagnosis -Agree with steroids -S/p temporal artery biopsy 3/11, result pending   Anemia of chronic disease -Status post EGD, colonoscopy 3/5 without GI etiology of anemia.  GI has signed off -Appreciate hematology -Goal hemoglobin >9 -Stable   History of bilateral cerebellar CVA -Continue aspirin  Hyperlipidemia -Patient has not tolerated statins in the past  Hypertension -Continue Norvasc  Chronic diastolic heart failure -Without evidence of acute exacerbation at this time  Generalized weakness -PT OT - family elected for home health therapies   Severe protein calorie malnutrition -Estimated body mass index is 17.54 kg/m as calculated from the following:   Height as of this encounter: _0  (1.651 m).   Weight as of this encounter: 47.8 kg.  Hyperglycemia -In setting of steroid use.  Continue sliding scale insulin    In agreement with assessment of the pressure ulcer as below:  Pressure Injury 12/08/20 Coccyx Medial Stage 1 -  Intact skin with non-blanchable redness of a localized area usually over a bony prominence. not open,  purple area (Active)  12/08/20 1834  Location: Coccyx  Location Orientation: Medial  Staging: Stage 1 -  Intact skin with non-blanchable redness of a localized area usually over a bony prominence.  Wound Description (Comments): not open,  purple area  Present on Admission: Yes    Nutrition Problem: Moderate Malnutrition Etiology: chronic illness (  HTN, hyperlipidemia)  Discharge Instructions  Discharge Instructions    Call MD for:  difficulty breathing, headache or visual disturbances   Complete by: As directed    Call MD for:  extreme  fatigue   Complete by: As directed    Call MD for:  persistant dizziness or light-headedness   Complete by: As directed    Call MD for:  persistant nausea and vomiting   Complete by: As directed    Call MD for:  redness, tenderness, or signs of infection (pain, swelling, redness, odor or green/yellow discharge around incision site)   Complete by: As directed    Call MD for:  severe uncontrolled pain   Complete by: As directed    Call MD for:  temperature >100.4   Complete by: As directed    Discharge instructions   Complete by: As directed    You were cared for by a hospitalist during your hospital stay. If you have any questions about your discharge medications or the care you received while you were in the hospital after you are discharged, you can call the unit and ask to speak with the hospitalist on call if the hospitalist that took care of you is not available. Once you are discharged, your primary care physician will handle any further medical issues. Please note that NO REFILLS for any discharge medications will be authorized once you are discharged, as it is imperative that you return to your primary care physician (or establish a relationship with a primary care physician if you do not have one) for your aftercare needs so that they can reassess your need for medications and monitor your lab values.   Increase activity slowly   Complete by: As directed    No wound care   Complete by: As directed      Allergies as of 12/19/2020      Reactions   Aldomet [methyldopa] Other (See Comments)   flu   Fosamax [alendronate Sodium]    Acetazolamide Rash   Buspar [buspirone] Palpitations      Medication List    TAKE these medications   amLODipine 2.5 MG tablet Commonly known as: NORVASC Take 1 tablet (2.5 mg total) by mouth daily. Start taking on: December 20, 2020   aspirin 81 MG EC tablet Take 1 tablet (81 mg total) by mouth daily. Swallow whole. Start taking on: December 20, 2020    blood glucose meter kit and supplies Dispense based on patient and insurance preference. Use up to four times daily as directed. (FOR ICD-10 E10.9, E11.9).   colchicine 0.6 MG tablet Take 1 tablet (0.6 mg total) by mouth daily. Start taking on: December 20, 2020   Combigan 0.2-0.5 % ophthalmic solution Generic drug: brimonidine-timolol Place 1 drop into both eyes every 12 (twelve) hours.   dorzolamide 2 % ophthalmic solution Commonly known as: TRUSOPT Place 1 drop into both eyes 2 (two) times daily.   escitalopram 20 MG tablet Commonly known as: LEXAPRO TAKE 1/2 (ONE-HALF) TABLET BY MOUTH TWICE DAILY What changed: See the new instructions.   ferrous sulfate 325 (65 FE) MG tablet Take 325 mg by mouth 2 (two) times daily with a meal.   fluorometholone 0.1 % ophthalmic suspension Commonly known as: FML Place 1 drop into the right eye 2 (two) times daily.   hydroxypropyl methylcellulose / hypromellose 2.5 % ophthalmic solution Commonly known as: ISOPTO TEARS / GONIOVISC Place 1 drop into both eyes 3 (three) times daily as needed for dry eyes.  insulin aspart 100 UNIT/ML FlexPen Commonly known as: NOVOLOG Check blood sugar with meals, three times daily. Then follow sliding scale as provided.   Insulin Pen Needle 31G X 5 MM Misc Use with insulin, three times daily   latanoprost 0.005 % ophthalmic solution Commonly known as: XALATAN Place 1 drop into both eyes at bedtime.   lubiprostone 8 MCG capsule Commonly known as: Amitiza Take one cap with food once or twice daily to maintain regular bowel movements. What changed:   how much to take  how to take this  when to take this   pantoprazole 40 MG tablet Commonly known as: PROTONIX Take 1 tablet (40 mg total) by mouth daily. Start taking on: December 20, 2020   prednisoLONE acetate 1 % ophthalmic suspension Commonly known as: PRED FORTE Place 1 drop into the left eye daily.   predniSONE 10 MG tablet Commonly known  as: DELTASONE Take 47m daily until 3/23, taper to 437mdaily x 2 weeks, 3567maily x 2 weeks, 45m62mily x 2 weeks, 25mg49mly x 2 weeks, then 20mg 62my   Prolensa 0.07 % Soln Generic drug: Bromfenac Sodium Place 1 drop into the right eye daily.   Rhopressa 0.02 % Soln Generic drug: Netarsudil Dimesylate Place 1 drop into both eyes at bedtime.   sodium chloride 1 g tablet Take 1 tablet (1 g total) by mouth 3 (three) times daily with meals.   Vitamin D (Ergocalciferol) 1.25 MG (50000 UNIT) Caps capsule Commonly known as: DRISDOL Take 1 capsule (50,000 Units total) by mouth every 7 (seven) days. What changed: when to take this            Durable Medical Equipment  (From admission, onward)         Start     Ordered   12/18/20 1523  For home use only DME lightweight manual wheelchair with seat cushion  Once       Comments: Patient suffers from weakness which impairs their ability to perform daily activities like ADLs in the home.  A walking aid  will not resolve issue with performing activities of daily living. A wheelchair will allow patient to safely perform daily activities. Patient is not able to propel themselves in the home using a standard weight wheelchair due to weakness. Patient can self propel in the lightweight wheelchair. Length of need 12 months. Accessories: elevating leg rests (ELRs), wheel locks, extensions and anti-tippers.   12/18/20 1524   12/12/20 1603  For home use only DME Walker rolling  Once       Question Answer Comment  Walker: With 5 In[place]chan[place]ient needs a walker to treat with the following condition Weakness      12/12/20 1602   12/12/20 1602  For home use only DME 3 n 1  Once        12/12/20 1602          Follow-up Information    [facility] D[facility] up in 2 week(s).   Specialty: Cardiology Contact information: [address] [phone] Schedule an appointment as  soon as possible for a visit in 1 week(s).   Specialty: Family Medicine Contact information: [address] [phone]        Groat,Warden FillersGo on 12/22/2020.   Specialty: Ophthalmology Contact information: [phone]  Sater, Nanine Means, MD. Go on 01/23/2021.   Specialty: Neurology Contact information: [place] [address] [phone]        Collier Salina, MD. Go on 01/02/2021.   Specialty: Rheumatology Contact information: [address] [place] [address] [phone]              Allergies  Allergen Reactions  . Aldomet [Methyldopa] Other (See Comments)    flu  . Fosamax [Alendronate Sodium]   . Acetazolamide Rash  . Buspar [Buspirone] Palpitations    Consultations:  GI  Oncology  Neurology  Cardiology  Vascular surgery   Procedures/Studies: DG Chest 1 View  Result Date: 12/13/2020 CLINICAL DATA:  Status post left thoracentesis. EXAM: CHEST  1 VIEW COMPARISON:  Chest CT 12/11/2020 FINDINGS: Interval evacuation of the left pleural effusion without postprocedural pneumothorax. Minimal left basilar atelectasis. No right pleural effusion. Stable mild eventration of the right hemidiaphragm. Stable borderline cardiac enlargement. IMPRESSION: Status post left-sided thoracentesis without postprocedural pneumothorax. Electronically Signed   By: Marijo Sanes M.D.   On: 12/13/2020 14:57   DG Chest 1 View  Result Date: 12/08/2020 CLINICAL DATA:  Anemia, hypertension EXAM: CHEST  1 VIEW COMPARISON:  11/30/2020 FINDINGS: Cardiomegaly. Left base scarring. No effusions or edema. No acute bony abnormality. IMPRESSION: No active disease. Electronically Signed   By: Rolm Baptise M.D.   On: 12/08/2020 08:02   DG Chest 2 View  Result Date: 12/15/2020 CLINICAL DATA:  Pleural effusion. EXAM: CHEST - 2 VIEW COMPARISON:  Radiograph  12/13/2020.  CT 12/11/2020 FINDINGS: Small volume left pleural effusion best seen on the lateral view. Associated streaky atelectasis at the left lung base. No pneumothorax. Mild biapical pleuroparenchymal scarring. Mild cardiomegaly with unchanged mediastinal contours. No acute airspace disease. No pulmonary edema. Ossified intra-articular bodies in the left axillary recess. IMPRESSION: 1. Small volume left pleural effusion with streaky left basilar atelectasis. 2. Mild cardiomegaly. Electronically Signed   By: Keith Rake M.D.   On: 12/15/2020 18:47   DG Chest 2 View  Result Date: 11/30/2020 CLINICAL DATA:  Confusion, hyponatremia EXAM: CHEST - 2 VIEW COMPARISON:  10/05/2020 FINDINGS: Frontal and lateral views of the chest demonstrate mild enlargement of the cardiac silhouette. No airspace disease, effusion, or pneumothorax. No acute bony abnormalities. IMPRESSION: 1. No acute intrathoracic process. Electronically Signed   By: Randa Ngo M.D.   On: 11/30/2020 15:57   CT Head Wo Contrast  Result Date: 12/08/2020 CLINICAL DATA:  Facial trauma; hyponatremia, hypokalemia, anemia EXAM: CT HEAD WITHOUT CONTRAST TECHNIQUE: Contiguous axial images were obtained from the base of the skull through the vertex without intravenous contrast. Sagittal and coronal MPR images reconstructed from axial data set. COMPARISON:  01/10/2016 FINDINGS: Brain: Generalized atrophy. Normal ventricular morphology. No midline shift or mass effect. Small vessel chronic ischemic changes of deep cerebral white matter. Old BILATERAL cerebellar infarcts. No intracranial hemorrhage, mass lesion, or evidence of acute infarction. No extra-axial fluid collections. Vascular: No hyperdense vessels Skull: Demineralized but intact calvaria. Visualized facial bones appear intact. Sinuses/Orbits: Paranasal sinuses and mastoid air cells clear. Intraorbital soft tissue planes clear. Other: N/A IMPRESSION: Atrophy with small vessel chronic  ischemic changes of deep cerebral white matter. Old BILATERAL cerebellar infarcts. No acute intracranial abnormalities. Electronically Signed   By: Lavonia Dana M.D.   On: 12/08/2020 08:12   MR CERVICAL SPINE W WO CONTRAST  Result Date: 12/11/2020 CLINICAL DATA:  Initial evaluation for myelopathy, acute or progressive. EXAM: MRI CERVICAL SPINE WITHOUT  AND WITH CONTRAST TECHNIQUE: Multiplanar and multiecho pulse sequences of the cervical spine, to include the craniocervical junction and cervicothoracic junction, were obtained without and with intravenous contrast. CONTRAST:  62m GADAVIST GADOBUTROL 1 MMOL/ML IV SOLN COMPARISON:  Prior CT from 10/05/2020. FINDINGS: Alignment: Examination moderately degraded by motion artifact. Straightening of the normal cervical lordosis. No listhesis or static subluxation. Vertebrae: Vertebral body height maintained without acute or chronic fracture. Bone marrow signal intensity diffusely heterogeneous but within normal limits. No worrisome osseous lesions. No abnormal marrow edema or enhancement. Cord: Signal intensity within the cervical spinal cord is within normal limits. Normal cord caliber morphology. No abnormal enhancement. Posterior Fossa, vertebral arteries, paraspinal tissues: Chronic microvascular ischemic disease noted within the visualized pons and brainstem. Chronic bilateral cerebellar infarcts partially visualized. Craniocervical junction normal. There is a small curvilinear fluid collection measuring approximately 2.0 x 1.0 x 1.0 cm along the dorsal tip and left aspect of the C7 spinous process (series 8, image 32). Surrounding edema and enhancement evident on postcontrast sequence (series 11, image 8). Similar change also seen at the dorsal tip of the T1 spinous process. Enhancement extends to involve the adjacent interspinous regions. Paraspinous and prevertebral soft tissues otherwise within normal limits. Abnormal flow void within the left vertebral artery,  which could be related to slow flow and/or occlusion, although a small central flow void appears to be preserved (series 8, image 21). Diminutive but patent flow void within the right vertebral artery. Disc levels: C2-C3: Minimal disc bulge.  No canal or foraminal stenosis. C3-C4: Minimal disc bulge with mild facet hypertrophy. No canal or foraminal stenosis. C4-C5: Mild disc bulge with uncovertebral hypertrophy. Bilateral facet degeneration. No significant canal or foraminal stenosis. C5-C6: Mild degenerative intervertebral disc space narrowing with diffuse disc osteophyte complex. Flattening and partial effacement of the ventral thecal sac with resultant mild spinal stenosis. Moderate right with mild left C6 foraminal narrowing. C6-C7: Mild disc bulge with bilateral facet hypertrophy. No canal or foraminal stenosis. C7-T1:  Negative interspace.  Mild facet hypertrophy.  No stenosis. IMPRESSION: 1. Enhancement and edema about the dorsal tips of the C7 and T1 spinous processes, with a superimposed 2 cm collection adjacent to the C7 spinous process. Findings are nonspecific, but can be seen in the setting of underlying inflammatory conditions including polymyalgia rheumatica. In this setting, the collection could reflect a small bursal collection. Possible infection would be the primary differential consideration, and could be considered in the correct clinical setting. 2. Abnormal flow void within the left vertebral artery, which could be related to slow flow and/or occlusion. 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 3. Mild multilevel cervical spondylosis, most pronounced at C5-6 were there is resultant mild spinal stenosis, with moderate right C6 foraminal narrowing. 4. Normal MRI appearance of the cervical spinal cord. No cord signal changes to suggest myelopathy. Electronically Signed   By: BJeannine BogaM.D.   On: 12/11/2020 05:05   CT Abdomen Pelvis W Contrast  Result  Date: 11/30/2020 CLINICAL DATA:  Generalized abdominal pain. Constipation. Elevated liver function tests. EXAM: CT ABDOMEN AND PELVIS WITH CONTRAST TECHNIQUE: Multidetector CT imaging of the abdomen and pelvis was performed using the standard protocol following bolus administration of intravenous contrast. CONTRAST:  72mOMNIPAQUE IOHEXOL 300 MG/ML  SOLN COMPARISON:  None. FINDINGS: Lower Chest: Small pericardial effusion and small left pleural effusion are noted. Hepatobiliary: No hepatic masses identified. A few tiny calcified gallstones are seen, however there is no evidence of cholecystitis or biliary ductal dilatation.  Pancreas:  No mass or inflammatory changes. Spleen: Within normal limits in size and appearance. Adrenals/Urinary Tract: No masses identified. Small left renal cyst noted. No evidence of ureteral calculi or hydronephrosis. Stomach/Bowel: No evidence of obstruction, inflammatory process or abnormal fluid collections. Normal appendix visualized. Mild Diverticulosis is seen involving the sigmoid colon, however there is no evidence of diverticulitis. Vascular/Lymphatic: No pathologically enlarged lymph nodes. No abdominal aortic aneurysm. Aortic atherosclerotic calcification noted. Reproductive:  No mass or other significant abnormality. Other:  None. Musculoskeletal:  No suspicious bone lesions identified. IMPRESSION: No acute findings within the abdomen or pelvis. Cholelithiasis. No radiographic evidence of cholecystitis. Mild sigmoid diverticulosis. No radiographic evidence of diverticulitis. Small pericardial effusion and small left pleural effusion. Electronically Signed   By: Marlaine Hind M.D.   On: 11/30/2020 18:55   EEG adult  Result Date: 12/11/2020 Lora Havens, MD     12/11/2020  1:24 PM Patient Name: KEIR FOLAND MRN: [id number] Epilepsy Attending: Lora Havens Referring Physician/Provider: Dr Lesleigh Noe Date: 12/11/2020 Duration: 26.02 mins Patient history: 75 year old  female with worsening weakness. EEG to evaluate for seizures. Level of alertness: Awake AEDs during EEG study: None Technical aspects: This EEG study was done with scalp electrodes positioned according to the 10-20 International system of electrode placement. Electrical activity was acquired at a sampling rate of _0  and reviewed with a high frequency filter of _1  and a low frequency filter of _2 . EEG data were recorded continuously and digitally stored. Description: The posterior dominant rhythm consists of _3  activity of moderate voltage (25-35 uV) seen predominantly in posterior head regions, symmetric and reactive to eye opening and eye closing. EEG showed intermittent generalized 3 to 5 Hz theta-delta slowing. Hyperventilation did not show any EEG change. Physiologic photic driving was not seen during photic stimulation.    ABNORMALITY -Intermittent slow, generalized IMPRESSION: This study is suggestive of mild diffuse encephalopathy, nonspecific etiology. No seizures or epileptiform discharges were seen throughout the recording. Priyanka Barbra Sarks   CT ANGIO CHEST AORTA W/CM &/OR WO/CM  Result Date: 12/11/2020 CLINICAL DATA:  History gyn cell arteritis EXAM: CT ANGIOGRAPHY CHEST WITH CONTRAST TECHNIQUE: Multidetector CT imaging of the chest was performed using the standard protocol during bolus administration of intravenous contrast. Multiplanar CT image reconstructions and MIPs were obtained to evaluate the vascular anatomy. CONTRAST:  124m OMNIPAQUE IOHEXOL 350 MG/ML SOLN COMPARISON:  12/08/2020, 11/30/2020 CT of the abdomen FINDINGS: Cardiovascular: Initial precontrast images demonstrate atherosclerotic calcifications without aneurysmal dilatation. No hyperdense crescent to suggest acute aortic injury is seen. Post-contrast images demonstrate no aneurysmal dilatation or dissection of the thoracic aorta. Mild wall thickening is noted diffusely throughout the descending thoracic aorta. Heart is  enlarged in size. The pulmonary artery shows a normal branching pattern without evidence of intraluminal filling defect to suggest pulmonary embolism. No significant coronary calcifications are noted. Pericardial effusion is noted slightly larger than that seen on prior exam. Mediastinum/Nodes: Thoracic inlet is within normal limits. No sizable hilar or mediastinal adenopathy is noted. The esophagus is within normal limits. Lungs/Pleura: Diffuse emphysematous changes of lungs are noted. Large left-sided pleural effusion is noted which is increased from the prior CT of the chest. Left lower lobe consolidation is noted new from the prior exam. Small right-sided pleural effusion with dependent atelectasis is seen. No sizable parenchymal nodules are noted. Upper Abdomen: Visualized upper abdomen shows no acute abnormality. Musculoskeletal: Degenerative changes of the thoracic spine are noted. No acute bony abnormality is seen. Review of the  MIP images confirms the above findings. IMPRESSION: No evidence of pulmonary emboli. Enlarging pericardial effusion. Wall thickening is noted in the descending thoracic aorta. This would correspond with the given clinical history although nonspecific in nature. Enlarging left-sided pleural effusion with underlying left lower lobe consolidation. Aortic Atherosclerosis (ICD10-I70.0) and Emphysema (ICD10-J43.9). Electronically Signed   By: Inez Catalina M.D.   On: 12/11/2020 18:34   ECHOCARDIOGRAM COMPLETE  Result Date: 12/08/2020    ECHOCARDIOGRAM REPORT   Patient Name:   AREEBA SULSER Date of Exam: 12/08/2020 Medical Rec #:  [id number]         Height:       65.0 in Accession #:    [id number]        Weight:       105.0 lb Date of Birth:  Oct 04, 1946          BSA:          1.504 m Patient Age:    79 years          BP:           147/64 mmHg Patient Gender: F                 HR:           85 bpm. Exam Location:  Inpatient Procedure: 2D Echo, Cardiac Doppler and Color Doppler Indications:     Cardiomegaly I51.7  History:        Patient has no prior history of Echocardiogram examinations.                 Risk Factors:Hypertension.  Sonographer:    Bernadene Person RDCS Referring Phys: [id number] RONDELL A SMITH IMPRESSIONS  1. Left ventricular ejection fraction, by estimation, is 65 to 70%. The left ventricle has normal function. The left ventricle has no regional wall motion abnormalities. Left ventricular diastolic parameters are consistent with Grade I diastolic dysfunction (impaired relaxation).  2. Right ventricular systolic function is normal. The right ventricular size is normal. There is normal pulmonary artery systolic pressure. The estimated right ventricular systolic pressure is 42.5 mmHg.  3. Left atrial size was moderately dilated.  4. Diastolic free wall collapse.  5. A small pericardial effusion is present. The pericardial effusion is circumferential. There is no evidence of cardiac tamponade.  6. The mitral valve is grossly normal. Trivial mitral valve regurgitation.  7. The aortic valve is tricuspid. Aortic valve regurgitation is not visualized.  8. The inferior vena cava is normal in size with greater than 50% respiratory variability, suggesting right atrial pressure of 3 mmHg. Comparison(s): No prior Echocardiogram. FINDINGS  Left Ventricle: Left ventricular ejection fraction, by estimation, is 65 to 70%. The left ventricle has normal function. The left ventricle has no regional wall motion abnormalities. The left ventricular internal cavity size was normal in size. There is  no left ventricular hypertrophy. Left ventricular diastolic parameters are consistent with Grade I diastolic dysfunction (impaired relaxation). Indeterminate filling pressures. Right Ventricle: The right ventricular size is normal. No increase in right ventricular wall thickness. Right ventricular systolic function is normal. There is normal pulmonary artery systolic pressure. The tricuspid regurgitant velocity is  2.29 m/s, and  with an assumed right atrial pressure of 3 mmHg, the estimated right ventricular systolic pressure is 95.6 mmHg. Left Atrium: Left atrial size was moderately dilated. Right Atrium: Diastolic free wall collapse. Right atrial size was normal in size. Pericardium: A small pericardial effusion is present. The pericardial effusion is circumferential. There  is diastolic collapse of the right atrial wall and excessive respiratory variation in the tricuspid valve spectral Doppler velocities. There is no evidence of cardiac tamponade. Mitral Valve: The mitral valve is grossly normal. Trivial mitral valve regurgitation. Tricuspid Valve: The tricuspid valve is grossly normal. Tricuspid valve regurgitation is mild. Aortic Valve: The aortic valve is tricuspid. Aortic valve regurgitation is not visualized. Pulmonic Valve: The pulmonic valve was normal in structure. Pulmonic valve regurgitation is not visualized. Aorta: The aortic root and ascending aorta are structurally normal, with no evidence of dilitation. Venous: The inferior vena cava is normal in size with greater than 50% respiratory variability, suggesting right atrial pressure of 3 mmHg. IAS/Shunts: No atrial level shunt detected by color flow Doppler.  LEFT VENTRICLE PLAX 2D LVIDd:         4.70 cm  Diastology LVIDs:         2.90 cm  LV e' medial:    7.38 cm/s LV PW:         0.90 cm  LV E/e' medial:  11.6 LV IVS:        0.80 cm  LV e' lateral:   9.20 cm/s LVOT diam:     1.90 cm  LV E/e' lateral: 9.3 LV SV:         77 LV SV Index:   51 LVOT Area:     2.84 cm  RIGHT VENTRICLE RV S prime:     14.90 cm/s TAPSE (M-mode): 2.0 cm LEFT ATRIUM             Index       RIGHT ATRIUM           Index LA diam:        3.10 cm 2.06 cm/m  RA Area:     11.80 cm LA Vol (A2C):   63.5 ml 42.21 ml/m RA Volume:   23.40 ml  15.56 ml/m LA Vol (A4C):   57.5 ml 38.22 ml/m LA Biplane Vol: 63.3 ml 42.08 ml/m  AORTIC VALVE LVOT Vmax:   115.00 cm/s LVOT Vmean:  83.100 cm/s LVOT  VTI:    0.271 m  AORTA Ao Root diam: 3.00 cm Ao Asc diam:  3.00 cm MITRAL VALVE               TRICUSPID VALVE MV Area (PHT): 3.99 cm    TR Peak grad:   21.0 mmHg MV Decel Time: 190 msec    TR Vmax:        229.00 cm/s MV E velocity: 85.90 cm/s MV A velocity: 95.10 cm/s  SHUNTS MV E/A ratio:  0.90        Systemic VTI:  0.27 m                            Systemic Diam: 1.90 cm Lyman Bishop MD Electronically signed by Lyman Bishop MD Signature Date/Time: 12/08/2020/12:22:01 PM    Final    ECHOCARDIOGRAM LIMITED  Result Date: 12/12/2020    ECHOCARDIOGRAM LIMITED REPORT   Patient Name:   CANDE MASTROPIETRO Date of Exam: 12/12/2020 Medical Rec #:  [id number]         Height:       65.0 in Accession #:    [id number]        Weight:       105.0 lb Date of Birth:  1946-06-08          BSA:  1.504 m Patient Age:    76 years          BP:           166/64 mmHg Patient Gender: F                 HR:           83 bpm. Exam Location:  Inpatient Procedure: Limited Echo Indications:    Pericardial effusion  History:        Patient has prior history of Echocardiogram examinations, most                 recent 12/08/2020. Pericardial Disease, Stroke; Risk                 Factors:Hypertension and Dyslipidemia. Pericardial effusion,                 pleural effusion.  Sonographer:    Dustin Flock Referring Phys: [id number] Kayleen Memos  Sonographer Comments: Follow up pericardial effusion from 12/08/20 IMPRESSIONS  1. Left ventricular ejection fraction, by estimation, is 55 to 60%. The left ventricle has normal function.  2. Right ventricular systolic function is normal. The right ventricular size is normal.  3. There is a small - moderate sized pericardial effusion. The IVC is normal sized and collapses with "sniff". There is slight invagination of the RA. The respiratory variability of the MV and TV inflows were not evaluated on this echo.         . Moderate pericardial effusion.  4. The aortic valve is normal in structure. Aortic valve  regurgitation is not visualized. No aortic stenosis is present. FINDINGS  Left Ventricle: Left ventricular ejection fraction, by estimation, is 55 to 60%. The left ventricle has normal function. Right Ventricle: The right ventricular size is normal. No increase in right ventricular wall thickness. Right ventricular systolic function is normal. Pericardium: There is a small - moderate sized pericardial effusion. The IVC is normal sized and collapses with "sniff". There is slight invagination of the RA. The respiratory variability of the MV and TV inflows were not evaluated on this echo. A moderately sized pericardial effusion is present. Aortic Valve: The aortic valve is normal in structure. Aortic valve regurgitation is not visualized. No aortic stenosis is present. 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 Interventional radiology asked to perform a therapeutic and diagnostic thoracentesis. EXAM: ULTRASOUND GUIDED THORACENTESIS MEDICATIONS: 1% lidocaine 10 mL COMPLICATIONS: None immediate. PROCEDURE: An ultrasound guided thoracentesis was thoroughly discussed with the patient and questions answered. The benefits, risks, alternatives and complications were also discussed. The patient understands and wishes to proceed with the procedure. Written consent was obtained. Ultrasound was performed to localize and mark an adequate pocket of fluid in the left chest. The area was then prepped and draped in the normal sterile fashion. 1% Lidocaine was used for local anesthesia. Under ultrasound guidance a 6 Fr Safe-T-Centesis catheter was introduced.  Thoracentesis was performed. The catheter  was removed and a dressing applied. FINDINGS: A total of approximately 300 mL of clear yellow fluid was removed. Samples were sent to the laboratory as requested by the clinical team. IMPRESSION: Successful ultrasound guided left thoracentesis yielding 300 mL of pleural fluid. Read by: Soyla Dryer, NP Electronically Signed   By: Jerilynn Mages.  Shick M.D.   On: 12/13/2020 15:22       Discharge Exam: Vitals:   12/19/20 0340 12/19/20 1047  BP: (!) 148/67 (!) 169/60  Pulse: 79   Resp: 20 18  Temp: 97.8 F (36.6 C) 98.5 F (36.9 C)  SpO2: 97% 98%    General: Pt is alert, awake, not in acute distress Cardiovascular: RRR, S1/S2 +, no edema Respiratory: CTA bilaterally, no wheezing, no rhonchi, no respiratory distress, no conversational dyspnea  Abdominal: Soft, NT, ND, bowel sounds + Extremities: no edema, no cyanosis Psych: Normal mood and affect, stable judgement and insight     The results of significant diagnostics from this hospitalization (including imaging, microbiology, ancillary and laboratory) are listed below for reference.     Microbiology: Recent Results (from the past 240 hour(s))  Culture, body fluid w Gram Stain-bottle     Status: None   Collection Time: 12/13/20  2:38 PM   Specimen: Fluid  Result Value Ref Range Status   Specimen Description FLUID PLEURAL LEFT  Final   Special Requests BOTTLES DRAWN AEROBIC AND ANAEROBIC  Final   Culture   Final    NO GROWTH 5 DAYS Performed at [facility], [address], [place], [place] [address]    Report Status 12/18/2020 FINAL  Final  Gram stain     Status: None   Collection Time: 12/13/20  2:38 PM   Specimen: Fluid  Result Value Ref Range Status   Specimen Description FLUID PLEURAL LEFT  Final   Special Requests NONE  Final   Gram Stain   Final    WBC PRESENT, PREDOMINANTLY MONONUCLEAR NO ORGANISMS SEEN CYTOSPIN SMEAR Performed at [facility], [place] [address],  [place],  [address]    Report Status 12/13/2020 FINAL  Final     Labs: BNP (last 3 results) Recent Labs    12/01/20 1127  BNP 045.9*   Basic Metabolic Panel: Recent Labs  Lab 12/13/20 0252 12/13/20 1524 12/15/20 0304 12/16/20 0154 12/17/20 0501 12/18/20 0328 12/19/20 0219  NA 127*   < > 130* 132* 133* 133* 135  K 4.4   < > 4.1 4.6 4.1 3.6 3.6  CL 95*   < > 94* 98 98 98 99  CO2 24   < > _0 GLUCOSE 125*   < > 274* 148* 144* 121* 121*  BUN 9   < > _1 CREATININE 0.37*   < > 0.33* 0.48 0.38* 0.36* 0.41*  CALCIUM 8.1*   < > 9.2 8.8* 8.7* 8.7* 8.4*  MG 1.8  --   --   --   --  1.9  --    < > = values in this interval not displayed.   Liver Function Tests: Recent Labs  Lab 12/13/20 1524  AST 25  ALT 31  ALKPHOS 105  BILITOT 0.2*  PROT 5.9*  ALBUMIN 1.6*   No results for input(s): LIPASE, AMYLASE in the last 168 hours. No results for input(s): AMMONIA in the last 168 hours. CBC: Recent Labs  Lab 12/15/20 0304 12/16/20 0154 12/17/20 0501 12/18/20 9774 12/19/20 1423  WBC 5.9 12.9* 11.1* 10.4 11.3*  HGB 9.8* 8.9* 9.5* 10.3* 9.7*  HCT 30.7* 29.0* 30.6* 33.4* 31.3*  MCV 77.5* 80.3 79.7* 80.1 80.3  PLT 560* 553* 585* 598* 558*   Cardiac Enzymes: No results for input(s): CKTOTAL, CKMB, CKMBINDEX, TROPONINI in the last 168 hours. BNP: Invalid input(s): POCBNP CBG: Recent Labs  Lab 12/18/20 0812 12/18/20 1226 12/18/20 1640 12/18/20 1958 12/19/20 0800  GLUCAP 83 113* 207* 144* 84   D-Dimer No results for input(s): DDIMER in the last 72 hours. Hgb A1c No results for input(s): HGBA1C in the last 72 hours. Lipid Profile No results for input(s): CHOL, HDL, LDLCALC, TRIG, CHOLHDL, LDLDIRECT in the last 72 hours. Thyroid function studies No results for input(s): TSH, T4TOTAL, T3FREE, THYROIDAB in the last 72 hours.  Invalid input(s): FREET3 Anemia work up No results for input(s): VITAMINB12, FOLATE, FERRITIN, TIBC, IRON, RETICCTPCT  in the last 72 hours. Urinalysis    Component Value Date/Time   COLORURINE YELLOW 12/08/2020 0129   APPEARANCEUR HAZY (A) 12/08/2020 0129   APPEARANCEUR Clear 11/21/2020 1644   LABSPEC 1.019 12/08/2020 0129   PHURINE 6.0 12/08/2020 0129   GLUCOSEU NEGATIVE 12/08/2020 0129   HGBUR NEGATIVE 12/08/2020 0129   BILIRUBINUR NEGATIVE 12/08/2020 0129   BILIRUBINUR Negative 11/21/2020 1644   KETONESUR NEGATIVE 12/08/2020 0129   PROTEINUR NEGATIVE 12/08/2020 0129   UROBILINOGEN negative 05/13/2015 0827   NITRITE NEGATIVE 12/08/2020 0129   LEUKOCYTESUR NEGATIVE 12/08/2020 0129   Sepsis Labs Invalid input(s): PROCALCITONIN,  WBC,  LACTICIDVEN Microbiology Recent Results (from the past 240 hour(s))  Culture, body fluid w Gram Stain-bottle     Status: None   Collection Time: 12/13/20  2:38 PM   Specimen: Fluid  Result Value Ref Range Status   Specimen Description FLUID PLEURAL LEFT  Final   Special Requests BOTTLES DRAWN AEROBIC AND ANAEROBIC  Final   Culture   Final    NO GROWTH 5 DAYS Performed at [facility], [place] [address], [place], [place] [address]    Report Status 12/18/2020 FINAL  Final  Gram stain     Status: None   Collection Time: 12/13/20  2:38 PM   Specimen: Fluid  Result Value Ref Range Status   Specimen Description FLUID PLEURAL LEFT  Final   Special Requests NONE  Final   Gram Stain   Final    WBC PRESENT, PREDOMINANTLY MONONUCLEAR NO ORGANISMS SEEN CYTOSPIN SMEAR Performed at [facility], [place] [address] [place], [place] [address]    Report Status 12/13/2020 FINAL  Final     Patient was seen and examined on the day of discharge and was found to be in stable condition. Time coordinating discharge: 40 minutes including assessment and coordination of care, as well as examination of the patient.   SIGNED:  Dessa Phi, DO [facility] 12/19/2020, 10:50 AM

## 2020-12-19 NOTE — Progress Notes (Signed)
OT Cancellation Note  Patient Details Name: [name] MRN: [id number] DOB: [birth year]   Cancelled Treatment:    Reason Eval/Treat Not Completed: Other (comment) Pts MD present upon arrival, will check back as time allows for OT session.  Harley Alto., COTA/L Acute Rehabilitation Services [phone]   Precious Haws 12/19/2020, 10:09 AM

## 2020-12-19 NOTE — Progress Notes (Signed)
Glucometer and testing supplies delivered to patient room. Education provided upon proper usage with return demonstration from patient's spouse.

## 2020-12-19 NOTE — TOC Progression Note (Addendum)
Transition of Care [facility]) - Progression Note    Patient Details  Name: SHARMEKA PALMISANO MRN: [id number] Date of Birth: 1945-12-24  Transition of Care [facility] CM/SW Contact  Angelita Ingles, RN Phone Number: [phone]  12/19/2020, 10:40 AM  Clinical Narrative:    [facility] consulted for DME & Home Health needs. DME rolling walker and 3in1 has already been delivered and taken home per patients husband. Husband is requesting wheelchair for MD visits. CM is unable to order wheelchair due to insurance will not cover 2 walking aids. CM to update husband. HH has been called to [facility]. Awaiting determination if [facility] can accept for Home Health.       Barriers to Discharge: Continued Medical Work up  Expected Discharge Plan and Services                           DME Arranged: 3-N-1,Walker rolling DME Agency: [employer] Date DME Agency Contacted: 12/12/20 Time DME Agency Contacted: [phone] Representative spoke with at DME Agency: Adela Lank             Social Determinants of Health ([place]) Interventions    Readmission Risk Interventions No flowsheet data found.

## 2020-12-19 NOTE — Discharge Instructions (Signed)
   Check your blood sugar 3 times daily: before breakfast, before lunch, before dinner. You may also check throughout the day if you feel that your blood sugar is too low or too high.   Novolog is your short-acting correction insulin. Check your blood sugar prior to your meal, three times daily. Then depending on your blood sugar, inject insulin as follows: Blood Glucose 121 - 150: 1 units  BG 151 - 200: 2 units  BG 201 - 250: 3 units  BG 251 - 300: 5 units  BG 301 - 350: 7 units  BG 351 - 400: 9 units   Keep a journal of your blood sugar results each day. Bring to your primary care physician.   -Continue fluid restriction diet, 1522ml (50oz) per 24 hours

## 2020-12-19 NOTE — Telephone Encounter (Signed)
Called and spoke w/ Mckenzie [name]. Pt recently d/c'd from hospital for hyponatremia. Advised to f/u with neurology/cardiology/rheumatology. She is wondering if Dr. Felecia Shelling would provide VO for OT/PT since he was the one to originally order PT. I placed her on hold and received verbal ok to provide this. I relayed to [place]. She took VO and will fax over order for MD to sign at [phone].

## 2020-12-19 NOTE — Progress Notes (Signed)
Inpatient Diabetes Program Recommendations  AACE/ADA: New Consensus Statement on Inpatient Glycemic Control (2015)  Target Ranges:  Prepandial:   less than 140 mg/dL      Peak postprandial:   less than 180 mg/dL (1-2 hours)      Critically ill patients:  140 - 180 mg/dL   Lab Results  Component Value Date   GLUCAP 84 12/19/2020   HGBA1C 5.7 (H) 12/14/2020    Review of Glycemic Control  Inpatient Diabetes Program Recommendations:    Educated patient and spouse on insulin pen use at home. Reviewed contents of insulin flexpen starter kit. Reviewed all steps if insulin pen including attachment of needle, 2-unit air shot, dialing up dose, giving injection, removing needle, disposal of sharps, storage of unused insulin, disposal of insulin etc. Husband was able to provide successful return demonstration. Also reviewed troubleshooting with insulin pen.   Thank you, Nani Gasser. Hanks, RN, MSN, CDE  Diabetes Coordinator Inpatient Glycemic Control Team Team Pager [phone] (8am-5pm) 12/19/2020 11:50 AM

## 2020-12-19 NOTE — TOC Transition Note (Signed)
Transition of Care [facility]) - CM/SW Discharge Note   Patient Details  Name: Mckenzie [name] MRN: [id number] Date of Birth: Feb 07, 1946  Transition of Care [facility]) CM/SW Contact:  Angelita Ingles, RN Phone Number: [phone]  12/19/2020, 1:12 PM   Clinical Narrative:    Patient discharging home with home health services. Patient is currently active with [facility] and will resume services when discharged. Patient to be discharged home with spouse. No other needs noted at this time TOC will sign off.    Final next level of care: [place] Barriers to Discharge: No Barriers Identified   Patient Goals and CMS Choice Patient states their goals for this hospitalization and ongoing recovery are:: Wants to get better CMS Medicare.gov Compare Post Acute Care list provided to:: Patient Choice offered to / list presented to : [facility]  Discharge Placement                       Discharge Plan and Services                DME Arranged: 3-N-1,Walker rolling DME Agency: [employer] Date DME Agency Contacted: 12/12/20 Time DME Agency Contacted: [phone] Representative spoke with at DME Agency: [place]: PT,OT,Nurse's Aide [place] Agency: Other - See comment ([place] [facility])     Representative spoke with at [place]: [place] ([place]) Interventions     Readmission Risk Interventions No flowsheet data found.

## 2020-12-20 ENCOUNTER — Encounter: Payer: Self-pay | Admitting: Internal Medicine

## 2020-12-22 ENCOUNTER — Telehealth: Payer: Self-pay

## 2020-12-22 ENCOUNTER — Encounter (INDEPENDENT_AMBULATORY_CARE_PROVIDER_SITE_OTHER): Payer: Medicare Other | Admitting: Ophthalmology

## 2020-12-22 NOTE — Telephone Encounter (Signed)
Appointment scheduled.

## 2020-12-25 NOTE — Telephone Encounter (Signed)
Mckenzie [name] from [facility] states they need verbal orders for home health frequency.  Best contact: [phone]

## 2020-12-25 NOTE — Telephone Encounter (Signed)
Called and spoke with [facility]. They just resumed care for pt on 12/23/20. Would like to do visit 1x/week for 1 week and then 2x/week for 4 weeks. Provided VO for this. Nothing further needed.

## 2020-12-26 ENCOUNTER — Other Ambulatory Visit: Payer: Self-pay

## 2020-12-26 ENCOUNTER — Ambulatory Visit (INDEPENDENT_AMBULATORY_CARE_PROVIDER_SITE_OTHER): Payer: Medicare Other | Admitting: Family Medicine

## 2020-12-26 ENCOUNTER — Encounter: Payer: Self-pay | Admitting: Family Medicine

## 2020-12-26 VITALS — BP 144/73 | HR 75 | Temp 97.7°F | Ht 65.0 in | Wt 100.4 lb

## 2020-12-26 DIAGNOSIS — I10 Essential (primary) hypertension: Secondary | ICD-10-CM

## 2020-12-26 DIAGNOSIS — I3139 Other pericardial effusion (noninflammatory): Secondary | ICD-10-CM

## 2020-12-26 DIAGNOSIS — R739 Hyperglycemia, unspecified: Secondary | ICD-10-CM

## 2020-12-26 DIAGNOSIS — E871 Hypo-osmolality and hyponatremia: Secondary | ICD-10-CM

## 2020-12-26 DIAGNOSIS — R937 Abnormal findings on diagnostic imaging of other parts of musculoskeletal system: Secondary | ICD-10-CM | POA: Diagnosis not present

## 2020-12-26 DIAGNOSIS — D638 Anemia in other chronic diseases classified elsewhere: Secondary | ICD-10-CM

## 2020-12-26 DIAGNOSIS — M316 Other giant cell arteritis: Secondary | ICD-10-CM

## 2020-12-26 DIAGNOSIS — R531 Weakness: Secondary | ICD-10-CM

## 2020-12-26 DIAGNOSIS — T380X5A Adverse effect of glucocorticoids and synthetic analogues, initial encounter: Secondary | ICD-10-CM

## 2020-12-26 DIAGNOSIS — I313 Pericardial effusion (noninflammatory): Secondary | ICD-10-CM

## 2020-12-26 MED ORDER — AMLODIPINE BESYLATE 2.5 MG PO TABS: 2.5000 mg | ORAL_TABLET | Freq: Every day | ORAL | 1 refills | Status: DC

## 2020-12-26 NOTE — Progress Notes (Signed)
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  She will continue her prednisone taper and follow-up with rheumatology as scheduled. - BMP8+EGFR - CBC with Differential/Platelet  2. Hyponatremia Most recent sodium 135.  Labs today to assess. - BMP8+EGFR  3. Pericardial effusion Referral placed to cardiology as they have not heard anything and have not been able to get in touch with them.  Patient will need repeat echocardiogram. - Ambulatory referral to Cardiology  4. Abnormal MRI, cervical spine Patient will need repeat MRI.  She is to keep her upcoming appointment with rheumatology and neurology.  5. Generalized weakness Improving.  Patient will work with PT and OT at home.  Continue using walker for safety. - BMP8+EGFR - CBC with Differential/Platelet  6. Anemia of chronic disease Most recent hemoglobin 9.7.  Labs today to assess. - CBC with Differential/Platelet  7. Essential hypertension Well controlled on current regimen.  Advised I would like for patient systolic to be less than 563 and if they find she is consistently over that, she needs to take 5 mg of the amlodipine versus 2.5 mg. - BMP8+EGFR - amLODipine (NORVASC) 2.5 MG tablet; Take 1 tablet (2.5 mg total) by mouth daily.  Dispense: 90 tablet; Refill: 1  8. Steroid-induced hyperglycemia Patient to continue NovoLog for the time being as she is being tapered down on the prednisone.  Discussed if her blood sugars remain elevated once she is maintained at a lower dose, we will start a new medication so that she is not having to do injections and blood glucose checks multiple times a day.  If they find you are needing more supplies they will send me a MyChart message to let me know what type of glucometer they have so that these can be ordered. - BMP8+EGFR   Follow up plan: Return as directed after labs result.  Hendricks Limes, MSN, APRN, FNP-[facility]  Subjective:   [name], female    DOB: 26-Feb-1946, 75 y.o.   MRN: [id number]  HPI: MAURENE [name] is a 75 y.o. female presenting on 12/26/2020 for Hospitalization Follow-up (3/3 [facility]- hyponatremia )  Patient is accompanied by her husband who she is okay with being present.  Patient was hospitalized at [facility] from 12/07/2020 - 12/20/2018 due to weakness, confusion, anemia and hyponatremia.  These records were reviewed by me.  It was recommended she follow-up with ophthalmology on 12/22/2020, rheumatology on 01/02/2021, neurology on 01/23/2021, and cardiology in 2 weeks.  Patient already had her appointment with ophthalmology and reports they told her that her vision is better than it has been in a very long time.  Her husband the prednisone is helping her vision and that the giant cell arteritis was worsening her vision.  She has her appointments with rheumatology and neurology scheduled and plans to be there.  They have been unable to get in touch with the cardiology office to schedule an appointment.  She was started on insulin due to hyperglycemia from the prednisone use.  Her prednisone is being tapered down every week to a holding dose of 20 mg which will be followed by rheumatology.  Her husband has been giving her NovoLog 3 times per day by the sliding scale that was provided to them.  She was seen by GI who performed an EGD and colonoscopy on 12/09/2020 which did not explain her anemia.   She had a CT of the  cervical spine which revealed a fluid collection at C7.  She had an EEG on 12/11/2020 with no evidence of seizure.  A follow-up MRI was recommended.  CTA of the chest revealed an enlarging pericardial effusion.  Patient underwent thoracentesis on 12/13/2020.  It was recommended she have a repeat echocardiogram.  She was started on and is to continue colchicine for 3 to 6 months.    Patient's CRP and ESR were elevated; she had a temporal artery  biopsy which was positive for giant cell arteritis.  PT and OT home health ordered and will be coming out this week.  Patient reports she is feeling much better and is getting back to her baseline.  Hyponatremia: She is taking salt tablets 3 times daily with meals.  Hypertension: Patient was started on amlodipine 2.5 mg once daily and given directions to take a second tablet if her blood pressure is greater than 160/90.   ROS: Negative unless specifically indicated above in HPI.   Relevant past medical history reviewed and updated as indicated.   Allergies and medications reviewed and updated.   Current Outpatient Medications:  .  amLODipine (NORVASC) 2.5 MG tablet, Take 1 tablet (2.5 mg total) by mouth daily., Disp: 30 tablet, Rfl: 0 .  aspirin EC 81 MG EC tablet, Take 1 tablet (81 mg total) by mouth daily. Swallow whole., Disp: 30 tablet, Rfl: 0 .  blood glucose meter kit and supplies, Dispense based on patient and insurance preference. Use up to four times daily as directed. (FOR ICD-10 E10.9, E11.9)., Disp: 1 each, Rfl: 0 .  colchicine 0.6 MG tablet, Take 1 tablet (0.6 mg total) by mouth daily., Disp: 30 tablet, Rfl: 0 .  COMBIGAN 0.2-0.5 % ophthalmic solution, Place 1 drop into both eyes every 12 (twelve) hours., Disp: , Rfl:  .  dorzolamide (TRUSOPT) 2 % ophthalmic solution, Place 1 drop into both eyes 2 (two) times daily., Disp: , Rfl:  .  escitalopram (LEXAPRO) 20 MG tablet, TAKE 1/2 (ONE-HALF) TABLET BY MOUTH TWICE DAILY (Patient taking differently: Take 20 mg by mouth daily.), Disp: 90 tablet, Rfl: 4 .  ferrous sulfate 325 (65 FE) MG tablet, Take 325 mg by mouth 2 (two) times daily with a meal., Disp: , Rfl:  .  fluorometholone (FML) 0.1 % ophthalmic suspension, Place 1 drop into the right eye 2 (two) times daily., Disp: , Rfl:  .  hydroxypropyl methylcellulose / hypromellose (ISOPTO TEARS / GONIOVISC) 2.5 % ophthalmic solution, Place 1 drop into both eyes 3 (three) times daily as  needed for dry eyes., Disp: , Rfl:  .  insulin aspart (NOVOLOG) 100 UNIT/ML FlexPen, Check blood sugar with meals, three times daily. Then follow sliding scale as provided., Disp: 15 mL, Rfl: 0 .  Insulin Pen Needle 31G X 5 MM MISC, Use with insulin, three times daily, Disp: 100 each, Rfl: 0 .  latanoprost (XALATAN) 0.005 % ophthalmic solution, Place 1 drop into both eyes at bedtime., Disp: , Rfl:  .  lubiprostone (AMITIZA) 8 MCG capsule, Take one cap with food once or twice daily to maintain regular bowel movements. (Patient taking differently: Take 8 mcg by mouth See admin instructions. Take one cap with food once or twice daily to maintain regular bowel movements.), Disp: 60 capsule, Rfl: 2 .  Netarsudil Dimesylate (RHOPRESSA) 0.02 % SOLN, Place 1 drop into both eyes at bedtime., Disp: , Rfl:  .  pantoprazole (PROTONIX) 40 MG tablet, Take 1 tablet (40 mg total) by mouth daily., Disp:  30 tablet, Rfl: 0 .  prednisoLONE acetate (PRED FORTE) 1 % ophthalmic suspension, Place 1 drop into the left eye daily., Disp: , Rfl:  .  predniSONE (DELTASONE) 10 MG tablet, Take 40m daily until 3/23, taper to 423mdaily x 2 weeks, 355maily x 2 weeks, 8m77mily x 2 weeks, 25mg3mly x 2 weeks, then 20mg 32my, Disp: 200 tablet, Rfl: 0 .  PROLENSA 0.07 % SOLN, Place 1 drop into the right eye daily., Disp: , Rfl:  .  sodium chloride 1 g tablet, Take 1 tablet (1 g total) by mouth 3 (three) times daily with meals., Disp: 90 tablet, Rfl: 0 .  Vitamin D, Ergocalciferol, (DRISDOL) 1.25 MG (50000 UNIT) CAPS capsule, Take 1 capsule (50,000 Units total) by mouth every 7 (seven) days. (Patient taking differently: Take 50,000 Units by mouth every Thursday.), Disp: 13 capsule, Rfl: 1  Allergies  Allergen Reactions  . Aldomet [Methyldopa] Other (See Comments)    flu  . Fosamax [Alendronate Sodium]   . Acetazolamide Rash  . Buspar [Buspirone] Palpitations    Objective:   BP (!) 144/73   Pulse 75   Temp 97.7 F (36.5  C) (Temporal)   Ht '5\' 5"'  (1.651 m)   Wt 100 lb 6.4 oz (45.5 kg)   SpO2 97%   BMI 16.71 kg/m    Physical Exam Vitals reviewed.  Constitutional:      General: She is not in acute distress.    Appearance: Normal appearance. She is underweight. She is not ill-appearing, toxic-appearing or diaphoretic.  HENT:     Head: Normocephalic and atraumatic.  Eyes:     General: No scleral icterus.       Right eye: No discharge.        Left eye: No discharge.     Conjunctiva/sclera: Conjunctivae normal.  Cardiovascular:     Rate and Rhythm: Normal rate and regular rhythm.     Heart sounds: Normal heart sounds. No murmur heard. No friction rub. No gallop.   Pulmonary:     Effort: Pulmonary effort is normal. No respiratory distress.     Breath sounds: Normal breath sounds. No stridor. No wheezing, rhonchi or rales.  Musculoskeletal:        General: Normal range of motion.     Cervical back: Normal range of motion.  Skin:    General: Skin is warm and dry.     Capillary Refill: Capillary refill takes less than 2 seconds.  Neurological:     General: No focal deficit present.     Mental Status: She is alert and oriented to person, place, and time. Mental status is at baseline.     Gait: Gait abnormal (ambulating with walker).  Psychiatric:        Mood and Affect: Mood normal.        Behavior: Behavior normal.        Thought Content: Thought content normal.        Judgment: Judgment normal.

## 2020-12-27 DIAGNOSIS — R937 Abnormal findings on diagnostic imaging of other parts of musculoskeletal system: Secondary | ICD-10-CM | POA: Insufficient documentation

## 2020-12-27 DIAGNOSIS — R739 Hyperglycemia, unspecified: Secondary | ICD-10-CM | POA: Insufficient documentation

## 2020-12-27 DIAGNOSIS — R531 Weakness: Secondary | ICD-10-CM | POA: Insufficient documentation

## 2020-12-27 DIAGNOSIS — I313 Pericardial effusion (noninflammatory): Secondary | ICD-10-CM | POA: Insufficient documentation

## 2020-12-27 DIAGNOSIS — M316 Other giant cell arteritis: Secondary | ICD-10-CM | POA: Insufficient documentation

## 2020-12-27 DIAGNOSIS — T380X5A Adverse effect of glucocorticoids and synthetic analogues, initial encounter: Secondary | ICD-10-CM | POA: Insufficient documentation

## 2020-12-27 DIAGNOSIS — I3139 Other pericardial effusion (noninflammatory): Secondary | ICD-10-CM | POA: Insufficient documentation

## 2020-12-27 LAB — BMP8+EGFR
BUN/Creatinine Ratio: 53 — ABNORMAL HIGH (ref 12–28)
BUN: 20 mg/dL (ref 8–27)
CO2: 23 mmol/L (ref 20–29)
Calcium: 9.4 mg/dL (ref 8.7–10.3)
Chloride: 97 mmol/L (ref 96–106)
Creatinine, Ser: 0.38 mg/dL — ABNORMAL LOW (ref 0.57–1.00)
Glucose: 182 mg/dL — ABNORMAL HIGH (ref 65–99)
Potassium: 5.1 mmol/L (ref 3.5–5.2)
Sodium: 137 mmol/L (ref 134–144)
eGFR: 104 mL/min/{1.73_m2} (ref >59)

## 2020-12-27 LAB — CBC WITH DIFFERENTIAL/PLATELET
Basophils Absolute: 0 10*3/uL (ref 0.0–0.2)
Basos: 0 %
EOS (ABSOLUTE): 0 10*3/uL (ref 0.0–0.4)
Eos: 0 %
Hematocrit: 38.2 % (ref 34.0–46.6)
Hemoglobin: 11.7 g/dL (ref 11.1–15.9)
Immature Grans (Abs): 0 10*3/uL (ref 0.0–0.1)
Immature Granulocytes: 0 %
Lymphocytes Absolute: 1.1 10*3/uL (ref 0.7–3.1)
Lymphs: 9 %
MCH: 24.3 pg — ABNORMAL LOW (ref 26.6–33.0)
MCHC: 30.6 g/dL — ABNORMAL LOW (ref 31.5–35.7)
MCV: 79 fL (ref 79–97)
Monocytes Absolute: 0.3 10*3/uL (ref 0.1–0.9)
Monocytes: 3 %
Neutrophils Absolute: 11 10*3/uL — ABNORMAL HIGH (ref 1.4–7.0)
Neutrophils: 88 %
Platelets: 617 10*3/uL — ABNORMAL HIGH (ref 150–450)
RBC: 4.81 x10E6/uL (ref 3.77–5.28)
RDW: 17.1 % — ABNORMAL HIGH (ref 11.7–15.4)
WBC: 12.5 10*3/uL — ABNORMAL HIGH (ref 3.4–10.8)

## 2020-12-27 MED ORDER — AMLODIPINE BESYLATE 2.5 MG PO TABS: 2.5000 mg | ORAL_TABLET | Freq: Every day | ORAL | 1 refills | Status: DC

## 2020-12-28 NOTE — Telephone Encounter (Signed)
Called back and provided VO for PT 1x/week for 4 weeks. She verbalized understanding and appreciation.

## 2020-12-28 NOTE — Telephone Encounter (Signed)
Tye Maryland from [facility] states that they need approval for plan of care for home PT. She states 1 week for 4 weeks for strengthening balance & endurance. Tye Maryland states family does not want nursing to come out. Best contact: [phone]

## 2021-01-01 NOTE — Progress Notes (Signed)
Office Visit Note  Patient: Mckenzie [name]             Date of Birth: 09/21/1946           MRN: [id number]             PCP: Loman Brooklyn, FNP Referring: Loman Brooklyn, FNP Visit Date: 01/02/2021   Subjective:  New Patient (Initial Visit) (Patient was having overall joint and muscle pain and has been on Prednisone X 3 weeks and noticed great improvement. )   History of Present Illness: Mckenzie [name] is a 75 y.o. female here for giant cell arteritis. Symptoms seem to extend back to last year with developing severe stiffness and aches in her upper and lower extremities with progressive decrease in mobility and function. She was hospitalized in December after fall with distal radius fracture. After that she continued overall decline and was admitted to the hospital again in March with hyponatremia affecting weakness and mental status. Workup revealed transudative pericardial effusion also descending thoracic aorta wall thickening and high inflammatory markers concerning for GCA. She underewnt bilateral temporal artery biopsies with pathology consistent with GCA. She started high dose steroids with slow tapering on prednisone and discharged home. Since that time she and her husband have noticed a very large improvement in her status mentally and physically. She is continuing the recommended therapy exercises at home although not in formal PT or home PT yet. Currently at 27m per day dose. She denies significant headache, jaw pain besides chronic TMJ, or visual changes. She reports ophthalmology follow up for her glaucoma noted actually decreased pressures on the prednisone. Cardiology follow up indicates partial reduction of effusion.  12/2020 Vit D 36.27 WBC 12.5 Plt 617  Biopsy from 12/15/20 was consistent with GCA on bilateral specimens.  Activities of Daily Living:  Patient reports morning stiffness for 0 minutes.   Patient Denies nocturnal pain.  Difficulty dressing/grooming:  Denies Difficulty climbing stairs: Reports Difficulty getting out of chair: Reports Difficulty using hands for taps, buttons, cutlery, and/or writing: Denies  Review of Systems  Constitutional: Negative for fatigue.  HENT: Negative for mouth sores, mouth dryness and nose dryness.   Eyes: Positive for visual disturbance. Negative for pain, itching and dryness.  Respiratory: Negative for cough, hemoptysis, shortness of breath and difficulty breathing.   Cardiovascular: Negative for chest pain, palpitations and swelling in legs/feet.  Gastrointestinal: Positive for constipation. Negative for abdominal pain, blood in stool and diarrhea.  Endocrine: Negative for increased urination.  Genitourinary: Negative for painful urination.  Musculoskeletal: Negative for arthralgias, joint pain, joint swelling, myalgias, muscle weakness, morning stiffness, muscle tenderness and myalgias.  Skin: Negative for color change, rash and redness.  Allergic/Immunologic: Negative for susceptible to infections.  Neurological: Positive for weakness. Negative for dizziness, numbness, headaches and memory loss.  Hematological: Negative for swollen glands.  Psychiatric/Behavioral: Negative for confusion and sleep disturbance.    PMFS History:  Patient Active Problem List   Diagnosis Date Noted  . High risk medication use 01/02/2021  . 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[place] 12/27/2020  . Pericardial effusion 12/27/2020  . Abnormal MRI, cervical spine 12/27/2020  . Generalized weakness 12/27/2020  . Steroid-induced hyperglycemia 12/27/2020  . Malnutrition of moderate degree 12/11/2020  . Pressure injury of skin 12/09/2020  . Benign neoplasm of cecum   . Benign neoplasm of descending colon   . Hiatal hernia   . Hyponatremia 12/08/2020  . Thrombocytosis 12/08/2020  . Hypoalbuminemia 12/08/2020  . Memory loss 12/08/2020  .  Constipation 12/06/2020  . Anemia of chronic disease 12/06/2020  . Gait disturbance 11/14/2020  .  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 11/14/2020  . Cognitive change 11/14/2020  . Adjustment disorder with mixed anxiety and depressed mood 10/10/2020  . Fracture 10/06/2020  . Distal radius fracture 10/05/2020  . Acute metabolic encephalopathy 03/00/9233  . Proximal weakness of extremity 09/26/2020  . Myalgia 09/26/2020  . Fall at home, initial encounter 09/26/2020  . 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 Essential hypertension 01/11/2014  . Vertigo 01/11/2014  . Hyperlipidemia 01/11/2014    Past Medical History:  Diagnosis Date  . Depression   . High cholesterol   . Hypertension   . Retinal micro-aneurysm of right eye     Family History  Problem Relation Age of Onset  . Hyperlipidemia Mother   . Hypertension Mother   . Diabetes Mother   . COPD Father   . Depression Sister   . Suicidality Brother   . Drug abuse Brother   . Healthy Daughter   . Healthy Daughter   . Healthy Daughter   . Colon cancer Neg Hx   . Celiac disease Neg Hx   . Inflammatory bowel disease Neg Hx    Past Surgical History:  Procedure Laterality Date  . ARTERY BIOPSY Bilateral 12/15/2020   Procedure: BILATERAL TEMPORAL ARTERY BIOPSIES;  Surgeon: Angelia Mould, MD;  Location: [place];  Service: Vascular;  Laterality: Bilateral;  . COLONOSCOPY WITH PROPOFOL N/A 12/09/2020   Procedure: COLONOSCOPY WITH PROPOFOL;  Surgeon: Gatha Mayer, MD;  Location: [facility] ENDOSCOPY;  Service: Endoscopy;  Laterality: N/A;  . ESOPHAGOGASTRODUODENOSCOPY (EGD) WITH PROPOFOL N/A 12/09/2020   Procedure: ESOPHAGOGASTRODUODENOSCOPY (EGD) WITH PROPOFOL;  Surgeon: Gatha Mayer, MD;  Location: [place];  Service: Endoscopy;  Laterality: N/A;  . EYE SURGERY    . FINGER SURGERY Left    RING FINGER  . IR THORACENTESIS ASP PLEURAL SPACE W/IMG GUIDE  12/13/2020  . OPEN REDUCTION INTERNAL FIXATION (ORIF) DISTAL RADIAL FRACTURE Left 10/05/2020   Procedure: OPEN REDUCTION INTERNAL FIXATION (ORIF) DISTAL RADIAL FRACTURE;  Surgeon: Shona Needles, MD;  Location: [place];  Service: Orthopedics;  Laterality: Left;  supraclavicular block  . POLYPECTOMY  12/09/2020   Procedure: POLYPECTOMY;  Surgeon: Gatha Mayer, MD;  Location: [facility] ENDOSCOPY;  Service: Endoscopy;;  . TUBAL LIGATION     Social History   Social History Narrative   Right handed   Caffeine use: coffee daily   Immunization History  Administered Date(s) Administered  . PFIZER(Purple Top)SARS-COV-2 Vaccination 11/11/2019, 12/06/2019, 10/31/2020  . Pneumococcal Conjugate-13 08/21/2018  . Pneumococcal Polysaccharide-23 07/07/2012  . Td 05/31/2011  . Tdap 10/07/2010, 05/31/2011     Objective: Vital Signs: BP 138/68 (BP Location: Right Arm, Patient Position: Sitting, Cuff Size: Normal)   Pulse 70   Ht _0  (1.626 m)   Wt 103 lb 3.2 oz (46.8 kg)   BMI 17.71 kg/m    Physical Exam Constitutional:      Comments: Underweight, chronically ill appearing  HENT:     Right Ear: External ear normal.     Mouth/Throat:     Mouth: Mucous membranes are moist.     Pharynx: Oropharynx is clear.  Eyes:     Conjunctiva/sclera: Conjunctivae normal.  Cardiovascular:     Rate and Rhythm: Normal rate and regular rhythm.  Pulmonary:     Effort: Pulmonary effort is normal.     Breath sounds: Normal breath sounds.  Skin:    General: Skin is warm  and dry.     Comments: Few ecchymoses on arms b/l  Neurological:     General: No focal deficit present.     Mental Status: She is alert.     Deep Tendon Reflexes: Reflexes normal.     Comments: Able to stand unaided, but with use of hands able to climb stool to seat unaided  Psychiatric:        Mood and Affect: Mood normal.     Musculoskeletal Exam:  Neck full ROM no tenderness Shoulders full ROM no tenderness or swelling Elbows full ROM no tenderness or swelling Wrists full ROM no tenderness or swelling Fingers full ROM no tenderness or swelling, heberdon's nodes present Knees full ROM no tenderness or swelling Ankles  full ROM no tenderness or swelling MTPs full ROM no tenderness or swelling   Investigation: No additional findings.  Imaging: DG Chest 1 View  Result Date: 12/13/2020 CLINICAL DATA:  Status post left thoracentesis. EXAM: CHEST  1 VIEW COMPARISON:  Chest CT 12/11/2020 FINDINGS: Interval evacuation of the left pleural effusion without postprocedural pneumothorax. Minimal left basilar atelectasis. No right pleural effusion. Stable mild eventration of the right hemidiaphragm. Stable borderline cardiac enlargement. IMPRESSION: Status post left-sided thoracentesis without postprocedural pneumothorax. Electronically Signed   By: Marijo Sanes M.D.   On: 12/13/2020 14:57   DG Chest 1 View  Result Date: 12/08/2020 CLINICAL DATA:  Anemia, hypertension EXAM: CHEST  1 VIEW COMPARISON:  11/30/2020 FINDINGS: Cardiomegaly. Left base scarring. No effusions or edema. No acute bony abnormality. IMPRESSION: No active disease. Electronically Signed   By: Rolm Baptise M.D.   On: 12/08/2020 08:02   DG Chest 2 View  Result Date: 12/15/2020 CLINICAL DATA:  Pleural effusion. EXAM: CHEST - 2 VIEW COMPARISON:  Radiograph 12/13/2020.  CT 12/11/2020 FINDINGS: Small volume left pleural effusion best seen on the lateral view. Associated streaky atelectasis at the left lung base. No pneumothorax. Mild biapical pleuroparenchymal scarring. Mild cardiomegaly with unchanged mediastinal contours. No acute airspace disease. No pulmonary edema. Ossified intra-articular bodies in the left axillary recess. IMPRESSION: 1. Small volume left pleural effusion with streaky left basilar atelectasis. 2. Mild cardiomegaly. Electronically Signed   By: Keith Rake M.D.   On: 12/15/2020 18:47   CT Head Wo Contrast  Result Date: 12/08/2020 CLINICAL DATA:  Facial trauma; hyponatremia, hypokalemia, anemia EXAM: CT HEAD WITHOUT CONTRAST TECHNIQUE: Contiguous axial images were obtained from the base of the skull through the vertex without  intravenous contrast. Sagittal and coronal MPR images reconstructed from axial data set. COMPARISON:  01/10/2016 FINDINGS: Brain: Generalized atrophy. Normal ventricular morphology. No midline shift or mass effect. Small vessel chronic ischemic changes of deep cerebral white matter. Old BILATERAL cerebellar infarcts. No intracranial hemorrhage, mass lesion, or evidence of acute infarction. No extra-axial fluid collections. Vascular: No hyperdense vessels Skull: Demineralized but intact calvaria. Visualized facial bones appear intact. Sinuses/Orbits: Paranasal sinuses and mastoid air cells clear. Intraorbital soft tissue planes clear. Other: N/A IMPRESSION: Atrophy with small vessel chronic ischemic changes of deep cerebral white matter. Old BILATERAL cerebellar infarcts. No acute intracranial abnormalities. Electronically Signed   By: Lavonia Dana M.D.   On: 12/08/2020 08:12   MR CERVICAL SPINE W WO CONTRAST  Result Date: 12/11/2020 CLINICAL DATA:  Initial evaluation for myelopathy, acute or progressive. EXAM: MRI CERVICAL SPINE WITHOUT AND WITH CONTRAST TECHNIQUE: Multiplanar and multiecho pulse sequences of the cervical spine, to include the craniocervical junction and cervicothoracic junction, were obtained without and with intravenous contrast. CONTRAST:  41m GADAVIST GADOBUTROL 1 MMOL/ML IV SOLN COMPARISON:  Prior CT from 10/05/2020. FINDINGS: Alignment: Examination moderately degraded by motion artifact. Straightening of the normal cervical lordosis. No listhesis or static subluxation. Vertebrae: Vertebral body height maintained without acute or chronic fracture. Bone marrow signal intensity diffusely heterogeneous but within normal limits. No worrisome osseous lesions. No abnormal marrow edema or enhancement. Cord: Signal intensity within the cervical spinal cord is within normal limits. Normal cord caliber morphology. No abnormal enhancement. Posterior Fossa, vertebral arteries, paraspinal tissues: Chronic  microvascular ischemic disease noted within the visualized pons and brainstem. Chronic bilateral cerebellar infarcts partially visualized. Craniocervical junction normal. There is a small curvilinear fluid collection measuring approximately 2.0 x 1.0 x 1.0 cm along the dorsal tip and left aspect of the C7 spinous process (series 8, image 32). Surrounding edema and enhancement evident on postcontrast sequence (series 11, image 8). Similar change also seen at the dorsal tip of the T1 spinous process. Enhancement extends to involve the adjacent interspinous regions. Paraspinous and prevertebral soft tissues otherwise within normal limits. Abnormal flow void within the left vertebral artery, which could be related to slow flow and/or occlusion, although a small central flow void appears to be preserved (series 8, image 21). Diminutive but patent flow void within the right vertebral artery. Disc levels: C2-C3: Minimal disc bulge.  No canal or foraminal stenosis. C3-C4: Minimal disc bulge with mild facet hypertrophy. No canal or foraminal stenosis. C4-C5: Mild disc bulge with uncovertebral hypertrophy. Bilateral facet degeneration. No significant canal or foraminal stenosis. C5-C6: Mild degenerative intervertebral disc space narrowing with diffuse disc osteophyte complex. Flattening and partial effacement of the ventral thecal sac with resultant mild spinal stenosis. Moderate right with mild left C6 foraminal narrowing. C6-C7: Mild disc bulge with bilateral facet hypertrophy. No canal or foraminal stenosis. C7-T1:  Negative interspace.  Mild facet hypertrophy.  No stenosis. IMPRESSION: 1. Enhancement and edema about the dorsal tips of the C7 and T1 spinous processes, with a superimposed 2 cm collection adjacent to the C7 spinous process. Findings are nonspecific, but can be seen in the setting of underlying inflammatory conditions including polymyalgia rheumatica. In this setting, the collection could reflect a small  bursal collection. Possible infection would be the primary differential consideration, and could be considered in the correct clinical setting. 2. Abnormal flow void within the left vertebral artery, which could be related to slow flow and/or occlusion. 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 3. Mild multilevel cervical spondylosis, most pronounced at C5-6 were there is resultant mild spinal stenosis, with moderate right C6 foraminal narrowing. 4. Normal MRI appearance of the cervical spinal cord. No cord signal changes to suggest myelopathy. Electronically Signed   By: BJeannine BogaM.D.   On: 12/11/2020 05:05   EEG adult  Result Date: 12/11/2020 Mckenzie Havens MD     12/11/2020  1:24 PM Patient Name: Mckenzie [name]: [id number] Attending: PLora HavensReferring Physician/Provider: Dr SLesleigh NoeDate: 12/11/2020 Duration: 26.02 mins Patient history: 75year old female with worsening weakness. EEG to evaluate for seizures. Level of alertness: Awake AEDs during EEG study: None Technical aspects: This EEG study was done with scalp electrodes positioned according to the 10-20 International system of electrode placement. Electrical activity was acquired at a sampling rate of _0  and reviewed with a high frequency filter of _1  and a low frequency filter of _2 . EEG data were recorded continuously and digitally stored. Description: The posterior dominant rhythm consists of _3  activity of moderate  voltage (25-35 uV) seen predominantly in posterior head regions, symmetric and reactive to eye opening and eye closing. EEG showed intermittent generalized 3 to 5 Hz theta-delta slowing. Hyperventilation did not show any EEG change. Physiologic photic driving was not seen during photic stimulation.    ABNORMALITY -Intermittent slow, generalized IMPRESSION: This study is suggestive of mild diffuse encephalopathy, nonspecific etiology. No seizures or epileptiform  discharges were seen throughout the recording. Priyanka Barbra Sarks   CT ANGIO CHEST AORTA W/CM &/OR WO/CM  Result Date: 12/11/2020 CLINICAL DATA:  History gyn cell arteritis EXAM: CT ANGIOGRAPHY CHEST WITH CONTRAST TECHNIQUE: Multidetector CT imaging of the chest was performed using the standard protocol during bolus administration of intravenous contrast. Multiplanar CT image reconstructions and MIPs were obtained to evaluate the vascular anatomy. CONTRAST:  1106m OMNIPAQUE IOHEXOL 350 MG/ML SOLN COMPARISON:  12/08/2020, 11/30/2020 CT of the abdomen FINDINGS: Cardiovascular: Initial precontrast images demonstrate atherosclerotic calcifications without aneurysmal dilatation. No hyperdense crescent to suggest acute aortic injury is seen. Post-contrast images demonstrate no aneurysmal dilatation or dissection of the thoracic aorta. Mild wall thickening is noted diffusely throughout the descending thoracic aorta. Heart is enlarged in size. The pulmonary artery shows a normal branching pattern without evidence of intraluminal filling defect to suggest pulmonary embolism. No significant coronary calcifications are noted. Pericardial effusion is noted slightly larger than that seen on prior exam. Mediastinum/Nodes: Thoracic inlet is within normal limits. No sizable hilar or mediastinal adenopathy is noted. The esophagus is within normal limits. Lungs/Pleura: Diffuse emphysematous changes of lungs are noted. Large left-sided pleural effusion is noted which is increased from the prior CT of the chest. Left lower lobe consolidation is noted new from the prior exam. Small right-sided pleural effusion with dependent atelectasis is seen. No sizable parenchymal nodules are noted. Upper Abdomen: Visualized upper abdomen shows no acute abnormality. Musculoskeletal: Degenerative changes of the thoracic spine are noted. No acute bony abnormality is seen. Review of the MIP images confirms the above findings. IMPRESSION: No evidence  of pulmonary emboli. Enlarging pericardial effusion. Wall thickening is noted in the descending thoracic aorta. This would correspond with the given clinical history although nonspecific in nature. Enlarging left-sided pleural effusion with underlying left lower lobe consolidation. Aortic Atherosclerosis (ICD10-I70.0) and Emphysema (ICD10-J43.9). Electronically Signed   By: MInez CatalinaM.D.   On: 12/11/2020 18:34   ECHOCARDIOGRAM COMPLETE  Result Date: 12/08/2020    ECHOCARDIOGRAM REPORT   Patient Name:   CJESSABELLE MARKIEWICZDate of Exam: 12/08/2020 Medical Rec #:  [id number]        Height:       65.0 in Accession #:    [id number]       Weight:       105.0 lb Date of Birth:  823-May-1947         BSA:          1.504 m Patient Age:    89years          BP:           147/64 mmHg Patient Gender: F                 HR:           85 bpm. Exam Location:  Inpatient Procedure: 2D Echo, Cardiac Doppler and Color Doppler Indications:    Cardiomegaly I51.7  History:        Patient has no prior history of Echocardiogram examinations.  Risk Factors:Hypertension.  Sonographer:    Bernadene Person RDCS Referring Phys: [id number] RONDELL A SMITH IMPRESSIONS  1. Left ventricular ejection fraction, by estimation, is 65 to 70%. The left ventricle has normal function. The left ventricle has no regional wall motion abnormalities. Left ventricular diastolic parameters are consistent with Grade I diastolic dysfunction (impaired relaxation).  2. Right ventricular systolic function is normal. The right ventricular size is normal. There is normal pulmonary artery systolic pressure. The estimated right ventricular systolic pressure is 62.0 mmHg.  3. Left atrial size was moderately dilated.  4. Diastolic free wall collapse.  5. A small pericardial effusion is present. The pericardial effusion is circumferential. There is no evidence of cardiac tamponade.  6. The mitral valve is grossly normal. Trivial mitral valve regurgitation.  7. The  aortic valve is tricuspid. Aortic valve regurgitation is not visualized.  8. The inferior vena cava is normal in size with greater than 50% respiratory variability, suggesting right atrial pressure of 3 mmHg. Comparison(s): No prior Echocardiogram. FINDINGS  Left Ventricle: Left ventricular ejection fraction, by estimation, is 65 to 70%. The left ventricle has normal function. The left ventricle has no regional wall motion abnormalities. The left ventricular internal cavity size was normal in size. There is  no left ventricular hypertrophy. Left ventricular diastolic parameters are consistent with Grade I diastolic dysfunction (impaired relaxation). Indeterminate filling pressures. Right Ventricle: The right ventricular size is normal. No increase in right ventricular wall thickness. Right ventricular systolic function is normal. There is normal pulmonary artery systolic pressure. The tricuspid regurgitant velocity is 2.29 m/s, and  with an assumed right atrial pressure of 3 mmHg, the estimated right ventricular systolic pressure is 35.5 mmHg. Left Atrium: Left atrial size was moderately dilated. Right Atrium: Diastolic free wall collapse. Right atrial size was normal in size. Pericardium: A small pericardial effusion is present. The pericardial effusion is circumferential. There is diastolic collapse of the right atrial wall and excessive respiratory variation in the tricuspid valve spectral Doppler velocities. There is no evidence of cardiac tamponade. Mitral Valve: The mitral valve is grossly normal. Trivial mitral valve regurgitation. Tricuspid Valve: The tricuspid valve is grossly normal. Tricuspid valve regurgitation is mild. Aortic Valve: The aortic valve is tricuspid. Aortic valve regurgitation is not visualized. Pulmonic Valve: The pulmonic valve was normal in structure. Pulmonic valve regurgitation is not visualized. Aorta: The aortic root and ascending aorta are structurally normal, with no evidence of  dilitation. Venous: The inferior vena cava is normal in size with greater than 50% respiratory variability, suggesting right atrial pressure of 3 mmHg. IAS/Shunts: No atrial level shunt detected by color flow Doppler.  LEFT VENTRICLE PLAX 2D LVIDd:         4.70 cm  Diastology LVIDs:         2.90 cm  LV e' medial:    7.38 cm/s LV PW:         0.90 cm  LV E/e' medial:  11.6 LV IVS:        0.80 cm  LV e' lateral:   9.20 cm/s LVOT diam:     1.90 cm  LV E/e' lateral: 9.3 LV SV:         77 LV SV Index:   51 LVOT Area:     2.84 cm  RIGHT VENTRICLE RV S prime:     14.90 cm/s TAPSE (M-mode): 2.0 cm LEFT ATRIUM             Index  RIGHT ATRIUM           Index LA diam:        3.10 cm 2.06 cm/m  RA Area:     11.80 cm LA Vol (A2C):   63.5 ml 42.21 ml/m RA Volume:   23.40 ml  15.56 ml/m LA Vol (A4C):   57.5 ml 38.22 ml/m LA Biplane Vol: 63.3 ml 42.08 ml/m  AORTIC VALVE LVOT Vmax:   115.00 cm/s LVOT Vmean:  83.100 cm/s LVOT VTI:    0.271 m  AORTA Ao Root diam: 3.00 cm Ao Asc diam:  3.00 cm MITRAL VALVE               TRICUSPID VALVE MV Area (PHT): 3.99 cm    TR Peak grad:   21.0 mmHg MV Decel Time: 190 msec    TR Vmax:        229.00 cm/s MV E velocity: 85.90 cm/s MV A velocity: 95.10 cm/s  SHUNTS MV E/A ratio:  0.90        Systemic VTI:  0.27 m                            Systemic Diam: 1.90 cm Lyman Bishop MD Electronically signed by Lyman Bishop MD Signature Date/Time: 12/08/2020/12:22:01 PM    Final    ECHOCARDIOGRAM LIMITED  Result Date: 12/12/2020    ECHOCARDIOGRAM LIMITED REPORT   Patient Name:   Mckenzie [name] Date of Exam: 12/12/2020 Medical Rec #:  [id number]         Height:       65.0 in Accession #:    [id number]        Weight:       105.0 lb Date of Birth:  [birth year]          BSA:          1.504 m Patient Age:    61 years          BP:           166/64 mmHg Patient Gender: F                 HR:           83 bpm. Exam Location:  Inpatient Procedure: Limited Echo Indications:    Pericardial effusion  History:         Patient has prior history of Echocardiogram examinations, most                 recent 12/08/2020. Pericardial Disease, Stroke; Risk                 Factors:Hypertension and Dyslipidemia. Pericardial effusion,                 pleural effusion.  Sonographer:    Dustin Flock Referring Phys: [id number] Kayleen Memos  Sonographer Comments: Follow up pericardial effusion from 12/08/20 IMPRESSIONS  1. Left ventricular ejection fraction, by estimation, is 55 to 60%. The left ventricle has normal function.  2. Right ventricular systolic function is normal. The right ventricular size is normal.  3. There is a small - moderate sized pericardial effusion. The IVC is normal sized and collapses with "sniff". There is slight invagination of the RA. The respiratory variability of the MV and TV inflows were not evaluated on this echo.         . Moderate pericardial effusion.  4. The aortic valve is normal in  structure. Aortic valve regurgitation is not visualized. No aortic stenosis is present. FINDINGS  Left Ventricle: Left ventricular ejection fraction, by estimation, is 55 to 60%. The left ventricle has normal function. Right Ventricle: The right ventricular size is normal. No increase in right ventricular wall thickness. Right ventricular systolic function is normal. Pericardium: There is a small - moderate sized pericardial effusion. The IVC is normal sized and collapses with "sniff". There is slight invagination of the RA. The respiratory variability of the MV and TV inflows were not evaluated on this echo. A moderately sized pericardial effusion is present. Aortic Valve: The aortic valve is normal in structure. Aortic valve regurgitation is not visualized. No aortic stenosis is present. 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 Interventional radiology asked to perform a therapeutic and diagnostic thoracentesis. EXAM: ULTRASOUND GUIDED THORACENTESIS MEDICATIONS: 1% lidocaine 10 mL COMPLICATIONS: None immediate. PROCEDURE: An ultrasound guided thoracentesis was thoroughly discussed with the patient and questions answered. The benefits, risks, alternatives and complications were also discussed. The patient understands and wishes to proceed with the procedure. Written consent was obtained. Ultrasound was performed to localize and mark an adequate pocket of fluid in the left chest. The area was then prepped and draped in the normal sterile fashion. 1% Lidocaine was used for local anesthesia. Under ultrasound guidance a 6 Fr Safe-T-Centesis catheter was introduced. Thoracentesis was performed. The catheter was removed and a dressing applied. FINDINGS: A total of approximately 300 mL of clear yellow fluid was removed. Samples were sent to the laboratory as requested by the clinical team. IMPRESSION: Successful ultrasound guided left thoracentesis yielding 300 mL of pleural fluid. Read by: Soyla Dryer, NP Electronically Signed   By: Jerilynn Mages.  Shick M.D.   On: 12/13/2020 15:22    Recent Labs: Lab Results  Component Value Date   WBC 12.5 (H) 12/26/2020   HGB 11.7 12/26/2020   PLT 617 (H) 12/26/2020   NA 137 12/26/2020   K 5.1 12/26/2020   CL 97 12/26/2020   CO2 23 12/26/2020   GLUCOSE 182 (H) 12/26/2020   BUN 20 12/26/2020   CREATININE 0.38 (L) 12/26/2020   BILITOT 0.2 (L) 12/13/2020   ALKPHOS 105 12/13/2020   AST 25 12/13/2020   ALT 31 12/13/2020   PROT 5.9 (L) 12/13/2020   ALBUMIN 1.6 (L) 12/13/2020   CALCIUM 9.4 12/26/2020   GFRAA 120 12/01/2020    Speciality Comments: No specialty comments available.  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 Currently doing well on prednisone taper. Will check ESR and CRP at this time for correlation. Discussed treatment plan, I would recommend her to begin treatment with IL-6R if possible to reduce the risks of long term prednisone use, especially with osteoporotic fracture  less than 4 months ago. If not available or contraindication arises will continue on slow prednisone taper.  Steroid-induced hyperglycemia  Elevated glucose currently using insulin, no history of diabetes previously.  Secondary glaucoma, unspecified glaucoma stage, unspecified laterality  Glaucoma fortunately appears not steroid sensitive with good ophthalmology f/u already established.  Closed fracture of distal end of left radius, unspecified fracture morphology, initial encounter  Described mechanism of fracture sounds like osteoporosis defining injury and certainly at high risk for the amount of steroid exposure with GCA treatment. She has adequate vitamin D. We will discuss risks and benefits for treatment in f/u.  High risk medication use - Plan: Hepatitis B core antibody, IgM, Hepatitis B surface antigen, Hepatitis C antibody, HIV Antibody (routine testing w rflx), QuantiFERON-TB Gold Plus, IgG, IgA, IgM  Considering bDMARD treatment will check baseline monitoring hepatitis, TB, HIV screening.  Orders: Orders Placed This Encounter  Procedures  . Sedimentation rate  . C-reactive protein  . Hepatitis B core antibody, IgM  . Hepatitis B surface antigen  . Hepatitis C antibody  . HIV Antibody (routine testing w rflx)  . QuantiFERON-TB Gold Plus  . IgG, IgA, IgM   No orders of the defined types were  placed in this encounter.   Follow-Up Instructions: No follow-ups on file.   Collier Salina, MD  Note - This record has been created using [employer].  Chart creation errors have been sought, but may not always  have been located. Such creation errors do not reflect on  the standard of medical care.

## 2021-01-02 ENCOUNTER — Ambulatory Visit: Payer: Medicare Other | Admitting: Internal Medicine

## 2021-01-02 ENCOUNTER — Other Ambulatory Visit: Payer: Self-pay

## 2021-01-02 ENCOUNTER — Encounter: Payer: Self-pay | Admitting: Internal Medicine

## 2021-01-02 VITALS — BP 138/68 | HR 70 | Ht 64.0 in | Wt 103.2 lb

## 2021-01-02 DIAGNOSIS — H4050X0 Glaucoma secondary to other eye disorders, unspecified eye, stage unspecified: Secondary | ICD-10-CM

## 2021-01-02 DIAGNOSIS — S52502A Unspecified fracture of the lower end of left radius, initial encounter for closed fracture: Secondary | ICD-10-CM | POA: Diagnosis not present

## 2021-01-02 DIAGNOSIS — M316 Other giant cell arteritis: Secondary | ICD-10-CM

## 2021-01-02 DIAGNOSIS — Z79899 Other long term (current) drug therapy: Secondary | ICD-10-CM

## 2021-01-02 DIAGNOSIS — R739 Hyperglycemia, unspecified: Secondary | ICD-10-CM

## 2021-01-02 DIAGNOSIS — T380X5A Adverse effect of glucocorticoids and synthetic analogues, initial encounter: Secondary | ICD-10-CM

## 2021-01-02 NOTE — Patient Instructions (Addendum)
- I am checking for markers of inflammation activity today.  - For now continue taking the prednisone as prescribed hopefully we can get a medication called tocilizumab to treat your GCA that is more targeted and less side effects. If so, we can increase the rate of going down on steroids. I am checking lab tests today for safety of this medication.  - We will call to update you on the plan for this and follow up schedule.    Temporal Arteritis  Temporal arteritis is a condition that causes arteries to become inflamed. It usually affects arteries in your head and face, but arteries in any part of the body can become inflamed. The condition is also called giant cell arteritis.  Temporal arteritis can cause serious problems such as blindness. Early treatment can help prevent these problems. What are the causes? The cause of this condition is not known. What increases the risk? The following factors may make you more likely to develop this condition:  Being older than 50.  Being a woman.  Being Caucasian.  Being of [place], [place], [place], [place] or [place] ancestry.  Having a family history of the condition.  Having a certain condition that causes muscle pain and stiffness (polymyalgia rheumatica, PMR). What are the signs or symptoms? Some people with temporal arteritis have just one symptom, while others have several symptoms. Most symptoms are related to the head and face. These may include:  Headache.  Hard or swollen temples. This is common. Your temples are the areas on either side of your forehead. If your temples are swollen, it may hurt to touch them.  Pain when combing your hair or when laying your head down.  Pain in the jaw when chewing.  Pain in the throat or tongue.  Problems with your vision, such as sudden loss of vision in one eye, or seeing double. Other symptoms may include:  Fever.  Tiredness (fatigue).  A dry cough.  Pain in the hips and  shoulders.  Pain in the arms during exercise.  Depression.  Weight loss. How is this diagnosed? This condition may be diagnosed based on:  Your symptoms.  Your medical history.  A physical exam.  Tests, including: ? Blood tests. ? A test in which a tissue sample is removed from an artery so it can be examined (biopsy). ? Imaging tests, such as an ultrasound or MRI. How is this treated? This condition may be treated with:  A type of medicine to reduce inflammation (corticosteroid).  Medicines to weaken your immune system (immunosuppressants).  Other medicines to treat vision problems. You will need to see your health care provider while you are being treated. The medicines used to treat this condition can increase your risk of problems such as bone loss and diabetes. During follow-up visits, your health care provider will check for problems by:  Doing blood tests and bone density tests.  Checking your blood pressure and blood sugar. Follow these instructions at home: Medicines  Take over-the-counter and prescription medicines only as told by your health care provider.  Take any vitamins or supplements recommended by your health care provider. These may include vitamin D and calcium, which help keep your bones from becoming weak. Eating and drinking  Eat a heart-healthy diet. This may include: ? Eating high-fiber foods, such as fresh fruits and vegetables, whole grains, and beans. ? Eating heart-healthy fats (omega-3 fats), such as fish, flaxseed, and flaxseed oil. ? Limiting foods that are high in saturated fat and  cholesterol, such as processed and fried foods, fatty meat, and full-fat dairy. ? Limiting how much salt (sodium) you eat.  Include calcium and vitamin D in your diet. Good sources of calcium and vitamin D include: ? Low-fat dairy products such as milk, yogurt, and cheese. ? Certain fish, such as fresh or canned salmon, tuna, and sardines. ? Products that  have calcium and vitamin D added to them (fortified products), such as fortified cereals or juice.   General instructions  Exercise. Talk with your health care provider about what exercises are okay for you. Exercises that increase your heart rate (aerobic exercise), such as walking, are often recommended. Aerobic exercise helps control your blood pressure and prevent bone loss.  Stay up to date on all vaccines as directed by your health care provider.  Keep all follow-up visits as told by your health care provider. This is important. Contact a health care provider if:  Your symptoms get worse.  You develop signs of infection, such as fever, swelling, redness, warmth, and tenderness. Get help right away if:  You lose your vision.  Your pain does not go away, even after you take medicine.  You have chest pain.  You have trouble breathing.  One side of your face or body suddenly becomes weak or numb. These symptoms may represent a serious problem that is an emergency. Do not wait to see if the symptoms will go away. Get medical help right away. Call your local emergency services (911 in the [place] Do not drive yourself to the hospital. Summary  Temporal arteritis is a condition that causes arteries to become inflamed. It usually affects arteries in your head and face.  This condition can cause serious problems, such as blindness. Treatment can help prevent these problems.  Symptoms may include hard or tender temples, pain in your jaw when chewing, problems with your vision, or pain in your hips and shoulders.  Take over-the-counter and prescription medicines as told by your health care provider.   Tocilizumab Injection What is this medicine? TOCILIZUMAB (TOE si LIZ ue mab) is used to treat rheumatoid arthritis and juvenile idiopathic arthritis. It may also be used to treat giant cell arteritis, cytokine release syndrome, and to slow a decrease in lung function for patients with  systemic sclerosis-related lung disease. This medicine may be used for other purposes; ask your health care provider or pharmacist if you have questions. COMMON BRAND NAME(S): Actemra What should I tell my health care provider before I take this medicine? They need to know if you have any of these conditions:  cancer  diabetes (high blood sugar)  heart disease  hepatitis B or history of hepatitis B infection  high blood pressure  high cholesterol  immune system problems  infection especially a viral infection such as chickenpox, cold sores, or herpes  infection such as tuberculosis (TB) or other bacterial, fungal or viral infections  liver disease  low blood counts, like low white cell, platelet, or red cell counts  multiple sclerosis  recent or upcoming vaccine  stomach or intestine problems  stroke  an unusual or allergic reaction to tocilizumab, other medicines, foods, dyes, or preservatives  pregnant or trying to get pregnant  breast-feeding How should I use this medicine? This medicine is injected into a vein or under the skin. It is usually given by a health care provider in a hospital or clinic setting. If you get this medicine at home, you will be taught how to prepare and give  it. Use exactly as directed. Take it as directed on the prescription label at the same time every day. Keep taking it unless your health care provider tells you to stop. If you use a pen, be sure to take off the outer needle cover before using the dose. It is important that you put your used needles and syringes in a special sharps container. Do not put them in a trash can. If you do not have a sharps container, call your pharmacist or health care provider to get one. A special MedGuide will be given to you before each treatment or by the pharmacist with each prescription and refill. Be sure to read this information carefully each time. Talk to your health care provider regarding the use  of this medicine in children. While the drug may be prescribed for children as young as 2 years for selected conditions, precautions do apply. Overdosage: If you think you have taken too much of this medicine contact a poison control center or emergency room at once. NOTE: This medicine is only for you. Do not share this medicine with others. What if I miss a dose? It is important not to miss your dose. Call your health care provider if you are unable to keep an appointment. If you give yourself the medicine at home and you miss a dose, take it as soon as you can. If it is almost time for your next dose, take only that dose. Do not take double or extra doses. Call your health care provider with questions. What may interact with this medicine? Do not take this medicine with any of the following medications:  live virus vaccines This medicine may also interact with the following medications:  biologic medicines such as abatacept, adalimumab, anakinra, certolizumab, etanercept, golimumab, infliximab, rituximab, secukinumab, ustekinumab  birth control pills  certain medicines for cholesterol like atorvastatin, lovastatin, and simvastatin  cyclosporine  omeprazole  steroid medicines like prednisone or cortisone  theophylline  vaccines  warfarin This list may not describe all possible interactions. Give your health care provider a list of all the medicines, herbs, non-prescription drugs, or dietary supplements you use. Also tell them if you smoke, drink alcohol, or use illegal drugs. Some items may interact with your medicine. What should I watch for while using this medicine? Your condition will be monitored carefully while you are receiving this medicine. Tell your health care provider if your symptoms do not start to get better or if they get worse. You may need blood work done while you are taking this medicine. You will be tested for tuberculosis (TB) before you start this medicine. If  your doctor prescribes any medicine for TB, you should start taking the TB medicine before starting this medicine. Make sure to finish the full course of TB medicine. This medicine may increase your risk of getting an infection. Call your health care provider for advice if you get a fever, chills, sore throat, or other symptoms of a cold or flu. Do not treat yourself. Try to avoid being around people who are sick. Talk to your health care provider about your risk of cancer. You may be more at risk for certain types of cancers if you take this medicine. What side effects may I notice from receiving this medicine? Side effects that you should report to your doctor or health care professional as soon as possible:  allergic reactions (skin rash, itching or hives; swelling of the face, lips, or tongue)  changes in vision  increase in blood pressure  infection (fever, chills, cough, sore throat, pain or trouble passing urine)  light-colored stool  liver injury (dark yellow or brown urine; general ill feeling or flu-like symptoms; loss of appetite, right upper belly pain; unusually weak or tired; yellowing of the eyes or skin)  low red blood cell counts (trouble breathing; feeling faint; lightheaded, falls; unusually weak or tired)  pain, tingling, numbness in the hands or feet   tears in the stomach or intestines (fever; stomach pain; sudden change in bowel habits)  trouble breathing  unusual bruising or bleeding Side effects that usually do not require medical attention (report to your doctor or health care professional if they continue or are bothersome):  dizziness  headache  pain, redness, or irritation at site where injected This list may not describe all possible side effects. Call your doctor for medical advice about side effects. You may report side effects to FDA at 1-800-FDA-1088. Where should I keep my medicine? Keep out of the reach of children and pets. If you are using  this medicine at home, you will be instructed on how to store this medicine. Throw away any unused medicine after the expiration date on the label. To get rid of medicines that are no longer needed or have expired:  Take the medicine to a medicine take-back program. Check with your pharmacy or law enforcement to find a location.  If you cannot return the medicine, ask your pharmacist or health care provider how to get rid of this medicine safely.   Prednisone tablets What is this medicine? PREDNISONE (PRED ni sone) is a corticosteroid. It is commonly used to treat inflammation of the skin, joints, lungs, and other organs. Common conditions treated include asthma, allergies, and arthritis. It is also used for other conditions, such as blood disorders and diseases of the adrenal glands. This medicine may be used for other purposes; ask your health care provider or pharmacist if you have questions. COMMON BRAND NAME(S): Deltasone, Predone, Sterapred, Sterapred DS What should I tell my health care provider before I take this medicine? They need to know if you have any of these conditions:  Cushing's syndrome  diabetes  glaucoma  heart disease  high blood pressure  infection (especially a virus infection such as chickenpox, cold sores, or herpes)  kidney disease  liver disease  mental illness  myasthenia gravis  osteoporosis  seizures  stomach or intestine problems  thyroid disease  an unusual or allergic reaction to lactose, prednisone, other medicines, foods, dyes, or preservatives  pregnant or trying to get pregnant  breast-feeding How should I use this medicine? Take this medicine by mouth with a glass of water. Follow the directions on the prescription label. Take this medicine with food. If you are taking this medicine once a day, take it in the morning. Do not take more medicine than you are told to take. Do not suddenly stop taking your medicine because you may  develop a severe reaction. Your doctor will tell you how much medicine to take. If your doctor wants you to stop the medicine, the dose may be slowly lowered over time to avoid any side effects. Talk to your pediatrician regarding the use of this medicine in children. Special care may be needed. Overdosage: If you think you have taken too much of this medicine contact a poison control center or emergency room at once. NOTE: This medicine is only for you. Do not share this medicine with others. What if I  miss a dose? If you miss a dose, take it as soon as you can. If it is almost time for your next dose, talk to your doctor or health care professional. You may need to miss a dose or take an extra dose. Do not take double or extra doses without advice. What may interact with this medicine? Do not take this medicine with any of the following medications:  metyrapone  mifepristone This medicine may also interact with the following medications:  aminoglutethimide  amphotericin B  aspirin and aspirin-like medicines  barbiturates  certain medicines for diabetes, like glipizide or glyburide  cholestyramine  cholinesterase inhibitors  cyclosporine  digoxin  diuretics  ephedrine  female hormones, like estrogens and birth control pills  isoniazid  ketoconazole  NSAIDS, medicines for pain and inflammation, like ibuprofen or naproxen  phenytoin  rifampin  toxoids  vaccines  warfarin This list may not describe all possible interactions. Give your health care provider a list of all the medicines, herbs, non-prescription drugs, or dietary supplements you use. Also tell them if you smoke, drink alcohol, or use illegal drugs. Some items may interact with your medicine. What should I watch for while using this medicine? Visit your doctor or health care professional for regular checks on your progress. If you are taking this medicine over a prolonged period, carry an identification  card with your name and address, the type and dose of your medicine, and your doctor's name and address. This medicine may increase your risk of getting an infection. Tell your doctor or health care professional if you are around anyone with measles or chickenpox, or if you develop sores or blisters that do not heal properly. If you are going to have surgery, tell your doctor or health care professional that you have taken this medicine within the last twelve months. Ask your doctor or health care professional about your diet. You may need to lower the amount of salt you eat. This medicine may increase blood sugar. Ask your healthcare provider if changes in diet or medicines are needed if you have diabetes. What side effects may I notice from receiving this medicine? Side effects that you should report to your doctor or health care professional as soon as possible:  allergic reactions like skin rash, itching or hives, swelling of the face, lips, or tongue  changes in emotions or moods  changes in vision  depressed mood  eye pain  fever or chills, cough, sore throat, pain or difficulty passing urine  signs and symptoms of high blood sugar such as being more thirsty or hungry or having to urinate more than normal. You may also feel very tired or have blurry vision.  swelling of ankles, feet Side effects that usually do not require medical attention (report to your doctor or health care professional if they continue or are bothersome):  confusion, excitement, restlessness  headache  nausea, vomiting  skin problems, acne, thin and shiny skin  trouble sleeping  weight gain This list may not describe all possible side effects. Call your doctor for medical advice about side effects. You may report side effects to FDA at 1-800-FDA-1088. Where should I keep my medicine? Keep out of the reach of children. Store at room temperature between 15 and 30 degrees C (59 and 86 degrees F).  Protect from light. Keep container tightly closed. Throw away any unused medicine after the expiration date. NOTE: This sheet is a summary. It may not cover all possible information. If you have  questions about this medicine, talk to your doctor, pharmacist, or health care provider.  2021 [employer]vier/[employer] (2018-06-23 10:54:22)

## 2021-01-03 ENCOUNTER — Encounter: Payer: Self-pay | Admitting: Internal Medicine

## 2021-01-04 LAB — QUANTIFERON-TB GOLD PLUS
Mitogen-NIL: 0.67 IU/mL
NIL: 0.04 IU/mL
QuantiFERON-TB Gold Plus: NEGATIVE
TB1-NIL: <0 IU/mL
TB2-NIL: <0 IU/mL

## 2021-01-04 LAB — IGG, IGA, IGM
IgG (Immunoglobin G), Serum: 1508 mg/dL (ref 600–1540)
IgM, Serum: 153 mg/dL (ref 50–300)
Immunoglobulin A: 169 mg/dL (ref 70–320)

## 2021-01-04 LAB — SEDIMENTATION RATE: Sed Rate: 22 mm/h (ref 0–30)

## 2021-01-04 LAB — HEPATITIS C ANTIBODY
Hepatitis C Ab: NONREACTIVE
SIGNAL TO CUT-OFF: 0.01 (ref <1.00)

## 2021-01-04 LAB — HIV ANTIBODY (ROUTINE TESTING W REFLEX): HIV 1&2 Ab, 4th Generation: NONREACTIVE

## 2021-01-04 LAB — HEPATITIS B SURFACE ANTIGEN: Hepatitis B Surface Ag: NONREACTIVE

## 2021-01-04 LAB — C-REACTIVE PROTEIN: CRP: 34 mg/L — ABNORMAL HIGH (ref <8.0)

## 2021-01-04 LAB — HEPATITIS B CORE ANTIBODY, IGM: Hep B C IgM: NONREACTIVE

## 2021-01-04 NOTE — Telephone Encounter (Signed)
Please forward to Dr. Benjamine Mola.

## 2021-01-08 ENCOUNTER — Other Ambulatory Visit (HOSPITAL_COMMUNITY): Payer: Self-pay

## 2021-01-08 ENCOUNTER — Telehealth: Payer: Self-pay | Admitting: Pharmacist

## 2021-01-08 NOTE — Telephone Encounter (Signed)
Received notification from [facility] regarding a prior authorization for [place]. Authorization has been APPROVED from 01/08/21 to 10/06/21.   Authorization # [id number]  Ran test claim, patient's copay is $774.76 for 1 month supply

## 2021-01-08 NOTE — Telephone Encounter (Signed)
Submitted a Prior Authorization request to [facility] for [place] via Cover My Meds. Will update once we receive a response.   Key: [id number] PA Case ID: [id number]  Patient has Medicare and will likely need to apply for [employer] PAP. Will mail to patient and will work on provider portion.

## 2021-01-08 NOTE — Telephone Encounter (Addendum)
Please start Actemra 162mg  PFS BIV.  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 Current taking prednisone but plan is to taper down once Actemra is started  Knox Saliva, PharmD, MPH Clinical Pharmacist (Rheumatology and Pulmonology)   ----- Message from Collier Salina, MD sent at 01/08/2021  8:05 AM EDT ----- Regarding: Tocilizumab I recommend starting tocilizumab 162 mg [place] every other week for giant cell arteritis. Patient has normal baseline monitoring labs. Family is available to assist with administration also and would prefer to avoid infusion for additional healthcare facility exposure.

## 2021-01-08 NOTE — Progress Notes (Signed)
Labs are wnl except CRP is significantly elevated, higher than it was last month. This suggests ongoing inflammatory activity. This can also be seen with  infection, which is an increased risk on high dose steroids but had no focal symptom complaints at our encounter.

## 2021-01-09 ENCOUNTER — Ambulatory Visit: Payer: Medicare Other | Admitting: Licensed Clinical Social Worker

## 2021-01-10 NOTE — Progress Notes (Deleted)
BH MD/PA/NP OP Progress Note  01/10/2021 3:01 PM Mckenzie [name]  MRN:  [id number]  Chief Complaint:  HPI:  - Since the last visit, she underewnt bilateral temporal artery biopsies with pathology consistent with GCA.She was started on high dose steroid with taper.    Visit Diagnosis: No diagnosis found.  Past Psychiatric History: Please see initial evaluation for full details. I have reviewed the history. No updates at this time.     Past Medical History:  Past Medical History:  Diagnosis Date  . Depression   . High cholesterol   . Hypertension   . Retinal micro-aneurysm of right eye     Past Surgical History:  Procedure Laterality Date  . ARTERY BIOPSY Bilateral 12/15/2020   Procedure: BILATERAL TEMPORAL ARTERY BIOPSIES;  Surgeon: Angelia Mould, MD;  Location: [facility];  Service: Vascular;  Laterality: Bilateral;  . COLONOSCOPY WITH PROPOFOL N/A 12/09/2020   Procedure: COLONOSCOPY WITH PROPOFOL;  Surgeon: Gatha Mayer, MD;  Location[facility] ENDOSCOPY;  Service: Endoscopy;  Laterality: N/A;  . ESOPHAGOGASTRODUODENOSCOPY (EGD) WITH PROPOFOL N/A 12/09/2020   Procedure: ESOPHAGOGASTRODUODENOSCOPY (EGD) WITH PROPOFOL;  Surgeon: Gatha Mayer, MD;  Location: [place];  Service: Endoscopy;  Laterality: N/A;  . EYE SURGERY    . FINGER SURGERY Left    RING FINGER  . IR THORACENTESIS ASP PLEURAL SPACE W/IMG GUIDE  12/13/2020  . OPEN REDUCTION INTERNAL FIXATION (ORIF) DISTAL RADIAL FRACTURE Left 10/05/2020   Procedure: OPEN REDUCTION INTERNAL FIXATION (ORIF) DISTAL RADIAL FRACTURE;  Surgeon: Shona Needles, MD;  Location: [place];  Service: Orthopedics;  Laterality: Left;  supraclavicular block  . POLYPECTOMY  12/09/2020   Procedure: POLYPECTOMY;  Surgeon: Gatha Mayer, MD;  Location: [facility] ENDOSCOPY;  Service: Endoscopy;;  . TUBAL LIGATION      Family Psychiatric History: Please see initial evaluation for full details. I have reviewed the history. No updates at this time.      Family History:  Family History  Problem Relation Age of Onset  . Hyperlipidemia Mother   . Hypertension Mother   . Diabetes Mother   . COPD Father   . Depression Sister   . Suicidality Brother   . Drug abuse Brother   . Healthy Daughter   . Healthy Daughter   . Healthy Daughter   . Colon cancer Neg Hx   . Celiac disease Neg Hx   . Inflammatory bowel disease Neg Hx     Social History:  Social History   Socioeconomic History  . Marital status: Married    Spouse name: Not on file  . Number of children: 3  . Years of education: 41  . Highest education level: Not on file  Occupational History  . Not on file  Tobacco Use  . Smoking status: Former Smoker    Types: Cigarettes  . Smokeless tobacco: Never Used  . Tobacco comment: for a short period  Vaping Use  . Vaping Use: Never used  Substance and Sexual Activity  . Alcohol use: No  . Drug use: No  . Sexual activity: Not on file  Other Topics Concern  . Not on file  Social History Narrative   Right handed   Caffeine use: coffee daily   Social Determinants of Health   Financial Resource Strain: Not on file  Food Insecurity: Not on file  Transportation Needs: Not on file  Physical Activity: Not on file  Stress: Not on file  Social Connections: Not on file    Allergies:  Allergies  Allergen Reactions  . Aldomet [Methyldopa] Other (See Comments)    flu  . Fosamax [Alendronate Sodium]   . Acetazolamide Rash  . Buspar [Buspirone] Palpitations    Metabolic Disorder Labs: Lab Results  Component Value Date   HGBA1C 5.7 (H) 12/14/2020   MPG 116.89 12/14/2020   No results found for: PROLACTIN Lab Results  Component Value Date   CHOL 127 12/11/2020   TRIG 88 12/11/2020   HDL 27 (L) 12/11/2020   CHOLHDL 4.7 12/11/2020   VLDL 18 12/11/2020   LDLCALC 82 12/11/2020   LDLCALC 87 01/04/2020   Lab Results  Component Value Date   TSH 2.100 09/26/2020    Therapeutic Level Labs: No results found  for: LITHIUM No results found for: VALPROATE No components found for:  CBMZ  Current Medications: Current Outpatient Medications  Medication Sig Dispense Refill  . Accu-Chek Softclix Lancets lancets USE UP TO FOUR TIMES DAILY AS DIRECTED 100 each 0  . amLODipine (NORVASC) 2.5 MG tablet Take 1 tablet (2.5 mg total) by mouth daily. 90 tablet 1  . aspirin 81 MG EC tablet TAKE 1 TABLET (81 MG TOTAL) BY MOUTH DAILY. SWALLOW WHOLE. 30 tablet 0  . aspirin EC 81 MG EC tablet Take 1 tablet (81 mg total) by mouth daily. Swallow whole. 30 tablet 0  . blood glucose meter kit and supplies Dispense based on patient and insurance preference. Use up to four times daily as directed. (FOR ICD-10 E10.9, E11.9). 1 each 0  . Blood Glucose Monitoring Suppl (ACCU-CHEK GUIDE) w/Device KIT USE UP TO FOUR TIMES DAILY AS DIRECTED 1 kit 0  . colchicine 0.6 MG tablet Take 1 tablet (0.6 mg total) by mouth daily. 30 tablet 0  . colchicine 0.6 MG tablet TAKE 1 TABLET (0.6 MG TOTAL) BY MOUTH DAILY. 30 tablet 0  . COMBIGAN 0.2-0.5 % ophthalmic solution Place 1 drop into both eyes every 12 (twelve) hours.    . dorzolamide (TRUSOPT) 2 % ophthalmic solution Place 1 drop into both eyes 2 (two) times daily.    Marland Kitchen escitalopram (LEXAPRO) 20 MG tablet TAKE 1/2 (ONE-HALF) TABLET BY MOUTH TWICE DAILY (Patient taking differently: Take 20 mg by mouth daily.) 90 tablet 4  . ferrous sulfate 325 (65 FE) MG tablet Take 325 mg by mouth 2 (two) times daily with a meal.    . fluorometholone (FML) 0.1 % ophthalmic suspension Place 1 drop into the right eye 2 (two) times daily.    Marland Kitchen glucose blood test strip USE UP TO FOUR TIMES DAILY AS DIRECTED 100 strip 0  . hydroxypropyl methylcellulose / hypromellose (ISOPTO TEARS / GONIOVISC) 2.5 % ophthalmic solution Place 1 drop into both eyes 3 (three) times daily as needed for dry eyes.    . insulin aspart (NOVOLOG) 100 UNIT/ML FlexPen Check blood sugar with meals, three times daily. Then follow sliding  scale as provided. 15 mL 0  . insulin aspart (NOVOLOG) 100 UNIT/ML FlexPen CHECK BLOOD SUGAR WITH MEALS, THREE TIMES DAILY. THEN FOLLOW SLIDING SCALE AS PROVIDED ON AFTER VISIT SUMMARY 15 mL 0  . Insulin Pen Needle 31G X 5 MM MISC Use with insulin, three times daily 100 each 0  . Insulin Pen Needle 32G X 4 MM MISC USE WITH INSULIN, THREE TIMES DAILY 100 each 0  . latanoprost (XALATAN) 0.005 % ophthalmic solution Place 1 drop into both eyes at bedtime.    Marland Kitchen lubiprostone (AMITIZA) 8 MCG capsule Take one cap with food once or twice daily  to maintain regular bowel movements. (Patient not taking: Reported on 01/02/2021) 60 capsule 2  . MAGNESIUM PO Take by mouth.    . Multiple Vitamin (MULTIVITAMIN) tablet Take 1 tablet by mouth daily.    Mckinley Jewel Dimesylate (RHOPRESSA) 0.02 % SOLN Place 1 drop into both eyes at bedtime.    . pantoprazole (PROTONIX) 40 MG tablet Take 1 tablet (40 mg total) by mouth daily. 30 tablet 0  . pantoprazole (PROTONIX) 40 MG tablet TAKE 1 TABLET (40 MG TOTAL) BY MOUTH DAILY. 30 tablet 0  . prednisoLONE acetate (PRED FORTE) 1 % ophthalmic suspension Place 1 drop into the left eye daily.    . predniSONE (DELTASONE) 10 MG tablet Take 4m daily until 3/23, taper to 427mdaily x 2 weeks, 3568maily x 2 weeks, 78m17mily x 2 weeks, 25mg36mly x 2 weeks, then 20mg 78my (Patient taking differently: Take 40 mg by mouth daily. Take 50mg d32m until 3/23, taper to 40mg da81mx 2 weeks, 35mg dai2m 2 weeks, 78mg dail78m2 weeks, 25mg daily71m weeks, then 20mg daily)52m tablet 0  . predniSONE (DELTASONE) 10 MG tablet SEE ATTACHED SHEET 250 tablet 0  . PROLENSA 0.07 % SOLN Place 1 drop into the right eye daily.    . sodium chloride 1 g tablet Take 1 tablet (1 g total) by mouth 3 (three) times daily with meals. 90 tablet 0  . sodium chloride 1 g tablet TAKE 1 TABLET (1 G TOTAL) BY MOUTH THREE TIMES DAILY WITH MEALS. 90 tablet 0  . Vitamin D, Ergocalciferol, (DRISDOL) 1.25 MG (50000  UNIT) CAPS capsule Take 1 capsule (50,000 Units total) by mouth every 7 (seven) days. (Patient taking differently: Take 50,000 Units by mouth every Thursday.) 13 capsule 1   No current facility-administered medications for this visit.     Musculoskeletal: Strength & Muscle Tone: N/A Gait & Station: N/A Patient leans: N/A  Psychiatric Specialty Exam: Review of Systems  There were no vitals taken for this visit.There is no height or weight on file to calculate BMI.  General Appearance: {Appearance:22683}  Eye Contact:  {BHH EYE CONTACT:22684}  Speech:  Clear and Coherent  Volume:  Normal  Mood:  {BHH MOOD:22306}  Affect:  {Affect (PAA):22687}  Thought Process:  Coherent  Orientation:  Full (Time, Place, and Person)  Thought Content: Logical   Suicidal Thoughts:  {ST/HT (PAA):22692}  Homicidal Thoughts:  {ST/HT (PAA):22692}  Memory:  Immediate;   Good  Judgement:  {Judgement (PAA):22694}  Insight:  {Insight (PAA):22695}  Psychomotor Activity:  Normal  Concentration:  Concentration: Good and Attention Span: Good  Recall:  Good  Fund of Knowledge: Good  Language: Good  Akathisia:  No  Handed:  Right  AIMS (if indicated): not done  Assets:  Communication Skills Desire for Improvement  ADL's:  Intact  Cognition: WNL  Sleep:  {BHH GOOD/FAIR/POOR:22877}   Screenings: PHQ2-9   [facility] 6 0 0 0 0  PHQ-9 Total Score 26 -- -- -- --    Flowsheet Row ED to Hosp-Admission (Discharged) [place]07/2020 [place] [facility] [place] [facility] [place] [facility]  2 not  populated Low Risk       Assessment and Plan:  Mckenzie [name] is a 75 y.o. year old female with a history of depression, GCA, who presents for follow up appointment for below.     1. Depression, unspecified depression type R/o MDD, single episode  R/o depressive disorder due to another medical condition  She reports mild symptoms of depression, which has worsened over the past several months. Psychosocial stressors includes sister in law in Dec, fall. She had lost her son from [place], twins, and brother by suicide. Both the patient and her family members reports there has been some improvement since she has been started on Lexapro.  Given it was recently uptitrated, will continue the current dose to target depression.  She is advised to obtain EKG to monitor QTC prolongation. Noted that this is reportedly her first episode of depression (except she struggled some in the past in the context of post menopause) . It is unclear whether she has any underlying medication condition such as parkinson to contribute to her mood symptoms given she has head tremors during the interview, and reports gait imbalances with cognitive impairment. The patient and her family was advised to check with her neurologist.   # r/o neurocognitive impairment  Exam is notable for occasional incoherence (such as abruptly talking about [employer] during conversation), although she was redirectable. At the end of the interview, the family raised concern of her confusion and declining in ADL over the past few months.  Will do further evaluation at the next visit.   Plan 1. Continue lexapro 20 mg daily (uptitrated by her neurologist last week) 2  Next appointment:  2/24 at 2 PM for 30 mins, video 3. Obtain EKG to rule out QTc prolongation QTc 429 msec in Dec - Will consider referral to therapy at the next visit   The patient demonstrates the following risk factors for suicide: Chronic risk factors for suicide include:  psychiatric disorder of n/a. Acute risk factors for suicide include: loss (financial, interpersonal, professional). Protective factors for this patient include: positive social support and hope for the future. Considering these factors, the overall suicide risk at this point appears to be low. Patient is appropriate for outpatient follow up.        Norman Clay, MD 01/10/2021, 3:01 PM

## 2021-01-10 NOTE — Telephone Encounter (Signed)
Filed Patient Media planner for  [employer] for [employer] along with provider portion, PA approval letter, med list, and insurace card copies. Patient was mailed patient portion of PAP application.  Filed application in Pending PAP folder next to [facility] desk  Fax# [phone] Phone# [phone]

## 2021-01-11 ENCOUNTER — Ambulatory Visit: Payer: Medicare Other | Admitting: Internal Medicine

## 2021-01-11 ENCOUNTER — Ambulatory Visit: Payer: Medicare Other | Admitting: Gastroenterology

## 2021-01-16 ENCOUNTER — Telehealth: Payer: Self-pay | Admitting: Neurology

## 2021-01-16 ENCOUNTER — Telehealth: Payer: Medicare Other | Admitting: Psychiatry

## 2021-01-16 NOTE — Telephone Encounter (Signed)
OT Radovan with [facility] has called requesting to Extend pt's OT orders for once a week for 4 weeks.OT Radovan can be reached at [phone]

## 2021-01-16 NOTE — Telephone Encounter (Signed)
Called and provided VO for request below. Solimon verbalized understanding and appreciation for call.

## 2021-01-17 ENCOUNTER — Encounter (INDEPENDENT_AMBULATORY_CARE_PROVIDER_SITE_OTHER): Payer: Medicare Other | Admitting: Ophthalmology

## 2021-01-17 ENCOUNTER — Telehealth: Payer: Self-pay | Admitting: Neurology

## 2021-01-17 ENCOUNTER — Other Ambulatory Visit: Payer: Self-pay

## 2021-01-17 DIAGNOSIS — H34833 Tributary (branch) retinal vein occlusion, bilateral, with macular edema: Secondary | ICD-10-CM | POA: Diagnosis not present

## 2021-01-17 DIAGNOSIS — I10 Essential (primary) hypertension: Secondary | ICD-10-CM | POA: Diagnosis not present

## 2021-01-17 DIAGNOSIS — H35033 Hypertensive retinopathy, bilateral: Secondary | ICD-10-CM

## 2021-01-17 NOTE — Telephone Encounter (Signed)
PT Cherise @ [facility] has called for verbal orders for 1 week 5 for Physical Therapy

## 2021-01-17 NOTE — Telephone Encounter (Signed)
Called back and provided VO as requested. She verbalized understanding.

## 2021-01-18 ENCOUNTER — Other Ambulatory Visit: Payer: Self-pay

## 2021-01-18 ENCOUNTER — Encounter: Payer: Self-pay | Admitting: Family Medicine

## 2021-01-18 ENCOUNTER — Ambulatory Visit (INDEPENDENT_AMBULATORY_CARE_PROVIDER_SITE_OTHER): Payer: Medicare Other | Admitting: Family Medicine

## 2021-01-18 ENCOUNTER — Telehealth: Payer: Self-pay

## 2021-01-18 VITALS — BP 152/75 | HR 90 | Temp 97.7°F | Ht 64.0 in | Wt 102.0 lb

## 2021-01-18 DIAGNOSIS — D638 Anemia in other chronic diseases classified elsewhere: Secondary | ICD-10-CM

## 2021-01-18 DIAGNOSIS — R238 Other skin changes: Secondary | ICD-10-CM

## 2021-01-18 DIAGNOSIS — Z4800 Encounter for change or removal of nonsurgical wound dressing: Secondary | ICD-10-CM

## 2021-01-18 DIAGNOSIS — R937 Abnormal findings on diagnostic imaging of other parts of musculoskeletal system: Secondary | ICD-10-CM | POA: Diagnosis not present

## 2021-01-18 DIAGNOSIS — E871 Hypo-osmolality and hyponatremia: Secondary | ICD-10-CM

## 2021-01-18 DIAGNOSIS — T380X5A Adverse effect of glucocorticoids and synthetic analogues, initial encounter: Secondary | ICD-10-CM

## 2021-01-18 DIAGNOSIS — R739 Hyperglycemia, unspecified: Secondary | ICD-10-CM

## 2021-01-18 DIAGNOSIS — F4323 Adjustment disorder with mixed anxiety and depressed mood: Secondary | ICD-10-CM

## 2021-01-18 DIAGNOSIS — Z4802 Encounter for removal of sutures: Secondary | ICD-10-CM

## 2021-01-18 DIAGNOSIS — M316 Other giant cell arteritis: Secondary | ICD-10-CM | POA: Diagnosis not present

## 2021-01-18 DIAGNOSIS — I3139 Other pericardial effusion (noninflammatory): Secondary | ICD-10-CM

## 2021-01-18 DIAGNOSIS — R233 Spontaneous ecchymoses: Secondary | ICD-10-CM

## 2021-01-18 DIAGNOSIS — R531 Weakness: Secondary | ICD-10-CM

## 2021-01-18 DIAGNOSIS — I313 Pericardial effusion (noninflammatory): Secondary | ICD-10-CM

## 2021-01-18 MED ORDER — ESCITALOPRAM OXALATE 20 MG PO TABS: 20.0000 mg | ORAL_TABLET | Freq: Every day | ORAL | 1 refills | Status: DC

## 2021-01-18 NOTE — Patient Instructions (Signed)
[facility] - Dr. Deitra Mayo [address] [place], [place] [address] [phone]

## 2021-01-18 NOTE — Telephone Encounter (Signed)
Patient's husband called to report stitches still present in the temporal artery biopsy incision and that it might be infected. Talked to PCP who saw them in clinic today. Says it is definitely not infected. There is a small vicryl suture knot present at the end of incision - and when PCP pulled on it, the skin tented. Advised patient that the sutures would fall out/dissolve. He took out his magnifying glass to study the area while we were on the phone. Decided it is indeed a white suture. Reassured him it was normal and advised to call back if patient has further issues. He verbalized understanding.

## 2021-01-18 NOTE — Progress Notes (Signed)
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 - CBC with Differential/Platelet - CMP14+EGFR  2. Pericardial effusion Managed by cardiology.  Improving per husband's report.  Patient is to remain on colchicine until she returns for her 32-monthfollow-up.  3. Abnormal MRI, cervical spine Appointment with neurology next week.  4. Generalized weakness Continue physical therapy and ambulating with walker for safety.  5. Anemia of chronic disease Labs to assess for the need for continued iron supplement. - CBC with Differential/Platelet  6. Steroid-induced hyperglycemia Advised patient and husband not to use NovoLog anymore.  Her fasting blood glucose readings ~100 are < 130.  Her other blood glucose readings between 120 and 150 are < 180.  Advised they can continue to spot check her blood glucose and keep a log.  Her prednisone dosage will only be decreased from here by rheumatology. - CMP14+EGFR  7. Hyponatremia Labs to assess for the need for continued salt tablets. - CMP14+EGFR  8. Adjustment disorder with mixed anxiety and depressed mood Well controlled on current regimen.  - escitalopram (LEXAPRO) 20 MG tablet; Take 1 tablet (20 mg total) by mouth daily.  Dispense: 90 tablet; Refill: 1  9. Bruises easily Labs to assess.  I suspect platelet count is decreased due to colchicine. - CBC with Differential/Platelet  10. Attention to dressings and sutures No signs or symptoms of infection, but I am uncomfortable removing the sutures that are supposed to be dissolvable.  Advised patient to follow-up with surgeon that completed her biopsies.  This was Dr. CDeitra Mayo  Contact information was given to the patient's husband.   Return in about 2 months (around 03/20/2021) for follow-up of chronic medication conditions.  BHendricks Limes MSN, APRN, FNP-C [facility]  Subjective:    [name] female     DOB: 805-Oct-1947 75y.o.   MRN: [id number] Patient Care Team: JLoman Brooklyn FNP as PCP - General (Family Medicine) CEloise Harman DO as Consulting Physician (Internal Medicine) SWyatt Portela MD as Consulting Physician (Oncology)   Chief Complaint:  Chief Complaint  Patient presents with  . Diabetes  . Hypertension    HPI: Mckenzie NAJARROis a 75y.o. female presenting on 01/18/2021 for Hypertension and Hyperglycemia  Patient is accompanied by her husband who she is okay with being present.  Giant cell arteritis: Patient saw rheumatology on 01/02/2021 where it was recommended she start on tocilizumab if possible to reduce the risk of long-term prednisone use.  A prior authorization is being completed by the rheumatology office.  Pericardial effusion: Husband reports he did take Camiah to see the cardiologist since our last visit, although I am unable to see this in epic.  He reports her pericardial effusion has decreased by 50 to 70% and that they advised she continue the colchicine until she follows up in 3 months.  Abnormal MRI of cervical spine: Patient is scheduled to see neurology on 01/23/2021.  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  Anemia: Patient has decreased her iron supplement from 2 tablets/day down to 1 tablet/day for the past 2 weeks due to the constipation she was experiencing.  Hyperglycemia: Husband reports he has actually quit checking her fasting blood glucose as it is always around 100.  He is still checking before lunch and dinner and reports her readings are generally 120-150.  The only numbers he has seen over that  were a 190 and a 202.  He does give her NovoLog for a glucose greater than 120.  New complaints: Husband is requesting I take over prescribing her Lexapro 20 mg once daily.  Patient reports she is bruising easily.  She still has the sutures from her temporal artery biopsies that were  completed on 12/15/2020 in the hospital.  Husband states they were told that they would dissolve and they have not.  He asked that I remove them while she is here.  Social history:  Relevant past medical, surgical, family and social history reviewed and updated as indicated. Interim medical history since our last visit reviewed.  Allergies and medications reviewed and updated.  DATA REVIEWED: CHART IN EPIC  ROS: Negative unless specifically indicated above in HPI.    Current Outpatient Medications:  .  Accu-Chek Softclix Lancets lancets, USE UP TO FOUR TIMES DAILY AS DIRECTED (Patient taking differently: USE UP TO FOUR TIMES DAILY AS DIRECTED), Disp: 100 each, Rfl: 0 .  amLODipine (NORVASC) 2.5 MG tablet, Take 1 tablet (2.5 mg total) by mouth daily., Disp: 90 tablet, Rfl: 1 .  aspirin 81 MG EC tablet, TAKE 1 TABLET (81 MG TOTAL) BY MOUTH DAILY. SWALLOW WHOLE. (Patient taking differently: Take by mouth 5 (five) times daily. SWALLOW WHOLE.), Disp: 30 tablet, Rfl: 0 .  aspirin EC 81 MG EC tablet, Take 1 tablet (81 mg total) by mouth daily. Swallow whole., Disp: 30 tablet, Rfl: 0 .  colchicine 0.6 MG tablet, Take 1 tablet (0.6 mg total) by mouth daily., Disp: 30 tablet, Rfl: 0 .  colchicine 0.6 MG tablet, TAKE 1 TABLET (0.6 MG TOTAL) BY MOUTH DAILY., Disp: 30 tablet, Rfl: 0 .  COMBIGAN 0.2-0.5 % ophthalmic solution, Place 1 drop into both eyes every 12 (twelve) hours., Disp: , Rfl:  .  dorzolamide (TRUSOPT) 2 % ophthalmic solution, Place 1 drop into both eyes 2 (two) times daily., Disp: , Rfl:  .  escitalopram (LEXAPRO) 20 MG tablet, TAKE 1/2 (ONE-HALF) TABLET BY MOUTH TWICE DAILY (Patient taking differently: Take 20 mg by mouth daily.), Disp: 90 tablet, Rfl: 4 .  ferrous sulfate 325 (65 FE) MG tablet, Take 325 mg by mouth 2 (two) times daily with a meal., Disp: , Rfl:  .  fluorometholone (FML) 0.1 % ophthalmic suspension, Place 1 drop into the right eye 2 (two) times daily., Disp: , Rfl:  .   glucose blood test strip, USE UP TO FOUR TIMES DAILY AS DIRECTED (Patient taking differently: USE UP TO FOUR TIMES DAILY AS DIRECTED), Disp: 100 strip, Rfl: 0 .  hydroxypropyl methylcellulose / hypromellose (ISOPTO TEARS / GONIOVISC) 2.5 % ophthalmic solution, Place 1 drop into both eyes 3 (three) times daily as needed for dry eyes., Disp: , Rfl:  .  insulin aspart (NOVOLOG) 100 UNIT/ML FlexPen, Check blood sugar with meals, three times daily. Then follow sliding scale as provided., Disp: 15 mL, Rfl: 0 .  insulin aspart (NOVOLOG) 100 UNIT/ML FlexPen, CHECK BLOOD SUGAR WITH MEALS, THREE TIMES DAILY. THEN FOLLOW SLIDING SCALE AS PROVIDED ON AFTER VISIT SUMMARY, Disp: 15 mL, Rfl: 0 .  Insulin Pen Needle 31G X 5 MM MISC, Use with insulin, three times daily, Disp: 100 each, Rfl: 0 .  Insulin Pen Needle 32G X 4 MM MISC, USE WITH INSULIN, THREE TIMES DAILY, Disp: 100 each, Rfl: 0 .  latanoprost (XALATAN) 0.005 % ophthalmic solution, Place 1 drop into both eyes at bedtime., Disp: , Rfl:  .  lubiprostone (AMITIZA) 8 MCG  capsule, Take one cap with food once or twice daily to maintain regular bowel movements., Disp: 60 capsule, Rfl: 2 .  MAGNESIUM PO, Take by mouth., Disp: , Rfl:  .  Multiple Vitamin (MULTIVITAMIN) tablet, Take 1 tablet by mouth daily., Disp: , Rfl:  .  Netarsudil Dimesylate (RHOPRESSA) 0.02 % SOLN, Place 1 drop into both eyes at bedtime., Disp: , Rfl:  .  pantoprazole (PROTONIX) 40 MG tablet, Take 1 tablet (40 mg total) by mouth daily., Disp: 30 tablet, Rfl: 0 .  pantoprazole (PROTONIX) 40 MG tablet, TAKE 1 TABLET (40 MG TOTAL) BY MOUTH DAILY., Disp: 30 tablet, Rfl: 0 .  prednisoLONE acetate (PRED FORTE) 1 % ophthalmic suspension, Place 1 drop into the left eye daily., Disp: , Rfl:  .  predniSONE (DELTASONE) 10 MG tablet, Take 31m daily until 3/23, taper to 442mdaily x 2 weeks, 3529maily x 2 weeks, 20m36mily x 2 weeks, 25mg56mly x 2 weeks, then 20mg 52my (Patient taking differently: Take  40 mg by mouth daily. Take 50mg d31m until 3/23, taper to 40mg da16mx 2 weeks, 35mg dai93m 2 weeks, 20mg dail75m2 weeks, 25mg daily20m weeks, then 20mg daily)70msp: 200 tablet, Rfl: 0 .  predniSONE (DELTASONE) 10 MG tablet, SEE ATTACHED SHEET, Disp: 250 tablet, Rfl: 0 .  PROLENSA 0.07 % SOLN, Place 1 drop into the right eye daily., Disp: , Rfl:  .  sodium chloride 1 g tablet, Take 1 tablet (1 g total) by mouth 3 (three) times daily with meals., Disp: 90 tablet, Rfl: 0 .  sodium chloride 1 g tablet, TAKE 1 TABLET (1 G TOTAL) BY MOUTH THREE TIMES DAILY WITH MEALS., Disp: 90 tablet, Rfl: 0 .  Vitamin D, Ergocalciferol, (DRISDOL) 1.25 MG (50000 UNIT) CAPS capsule, Take 1 capsule (50,000 Units total) by mouth every 7 (seven) days. (Patient taking differently: Take 50,000 Units by mouth every Thursday.), Disp: 13 capsule, Rfl: 1   Allergies  Allergen Reactions  . Aldomet [Methyldopa] Other (See Comments)    flu  . Fosamax [Alendronate Sodium]   . Acetazolamide Rash  . Buspar [Buspirone] Palpitations   Past Medical History:  Diagnosis Date  . Depression   . High cholesterol   . Hypertension   . Retinal micro-aneurysm of right eye     Past Surgical History:  Procedure Laterality Date  . ARTERY BIOPSY Bilateral 12/15/2020   Procedure: BILATERAL TEMPORAL ARTERY BIOPSIES;  Surgeon: Dickson, ChrAngelia Mouldion: [facility];  [facility];  Laterality: Bilateral;  . COLONOSCOPY WITH PROPOFOL N/A 12/09/2020   Procedure: COLONOSCOPY WITH PROPOFOL;  Surgeon: Gessner, CarGatha Mayerion: [facility];  Laterality: N/A;  . ESOPHAGOGASTRODUODENOSCOPY (EGD) WITH PROPOFOL N/A 12/09/2020   Procedure: ESOPHAGOGASTRODUODENOSCOPY (EGD) WITH PROPOFOL;  Surgeon: Gessner, CarGatha Mayerion: [facility];  Laterality: N/A;  . EYE SURGERY    . FINGER SURGERY Left    RING FINGER  . IR THORACENTESIS ASP PLEURAL SPACE W/IMG GUIDE  12/13/2020  . OPEN REDUCTION  INTERNAL FIXATION (ORIF) DISTAL RADIAL FRACTURE Left 10/05/2020   Procedure: OPEN REDUCTION INTERNAL FIXATION (ORIF) DISTAL RADIAL FRACTURE;  Surgeon: Haddix, KeviShona Needlesion: [facility];  [facility];  Laterality: Left;  supraclavicular block  . POLYPECTOMY  12/09/2020   Procedure: POLYPECTOMY;  Surgeon: Gessner, CarGatha Mayerion: [facility];;  . TUBAL LIGATION      Social History  Socioeconomic History  . Marital status: Married    Spouse name: Not on file  . Number of children: 3  . Years of education: 66  . Highest education level: Not on file  Occupational History  . Not on file  Tobacco Use  . Smoking status: Former Smoker    Types: Cigarettes  . Smokeless tobacco: Never Used  . Tobacco comment: for a short period  Vaping Use  . Vaping Use: Never used  Substance and Sexual Activity  . Alcohol use: No  . Drug use: No  . Sexual activity: Not on file  Other Topics Concern  . Not on file  Social History Narrative   Right handed   Caffeine use: coffee daily   Social Determinants of Health   Financial Resource Strain: Not on file  Food Insecurity: Not on file  Transportation Needs: Not on file  Physical Activity: Not on file  Stress: Not on file  Social Connections: Not on file  Intimate Partner Violence: Not on file        Objective:    BP (!) 152/75   Pulse 90   Temp 97.7 F (36.5 C) (Temporal)   Ht _0  (1.626 m)   Wt 102 lb (46.3 kg)   SpO2 92%   BMI 17.51 kg/m   Wt Readings from Last 3 Encounters:  01/02/21 103 lb 3.2 oz (46.8 kg)  12/26/20 100 lb 6.4 oz (45.5 kg)  12/15/20 105 lb 6.1 oz (47.8 kg)    Physical Exam Vitals reviewed.  Constitutional:      General: She is not in acute distress.    Appearance: Normal appearance. She is underweight. She is not ill-appearing, toxic-appearing or diaphoretic.  HENT:     Head: Normocephalic and atraumatic.  Eyes:     General: No scleral icterus.       Right eye:  No discharge.        Left eye: No discharge.     Conjunctiva/sclera: Conjunctivae normal.  Cardiovascular:     Rate and Rhythm: Normal rate and regular rhythm.     Heart sounds: Normal heart sounds. No murmur heard. No friction rub. No gallop.   Pulmonary:     Effort: Pulmonary effort is normal. No respiratory distress.     Breath sounds: Normal breath sounds. No stridor. No wheezing, rhonchi or rales.  Musculoskeletal:        General: Normal range of motion.     Cervical back: Normal range of motion.  Skin:    General: Skin is warm and dry.     Capillary Refill: Capillary refill takes less than 2 seconds.     Comments: Patient does have a suture knot on both sides of her face.  The right side is up in her hairline above her ear.  The left side is down a little lower in front of her ear.  The sutures are white in color.  If I grab the knot and pull the sutures make the skin tent and tug.  There is no erythema, warmth, swelling, or drainage to indicate signs of infection.  Neurological:     General: No focal deficit present.     Mental Status: She is alert and oriented to person, place, and time. Mental status is at baseline.     Gait: Gait abnormal (ambulating with walker).  Psychiatric:        Mood and Affect: Mood normal.        Behavior: Behavior normal.  Thought Content: Thought content normal.        Judgment: Judgment normal.     Lab Results  Component Value Date   TSH 2.100 09/26/2020   Lab Results  Component Value Date   WBC 12.5 (H) 12/26/2020   HGB 11.7 12/26/2020   HCT 38.2 12/26/2020   MCV 79 12/26/2020   PLT 617 (H) 12/26/2020   Lab Results  Component Value Date   NA 137 12/26/2020   K 5.1 12/26/2020   CO2 23 12/26/2020   GLUCOSE 182 (H) 12/26/2020   BUN 20 12/26/2020   CREATININE 0.38 (L) 12/26/2020   BILITOT 0.2 (L) 12/13/2020   ALKPHOS 105 12/13/2020   AST 25 12/13/2020   ALT 31 12/13/2020   PROT 5.9 (L) 12/13/2020   ALBUMIN 1.6 (L)  12/13/2020   CALCIUM 9.4 12/26/2020   ANIONGAP 7 12/19/2020   Lab Results  Component Value Date   CHOL 127 12/11/2020   Lab Results  Component Value Date   HDL 27 (L) 12/11/2020   Lab Results  Component Value Date   LDLCALC 82 12/11/2020   Lab Results  Component Value Date   TRIG 88 12/11/2020   Lab Results  Component Value Date   CHOLHDL 4.7 12/11/2020   Lab Results  Component Value Date   HGBA1C 5.7 (H) 12/14/2020

## 2021-01-19 LAB — CBC WITH DIFFERENTIAL/PLATELET
Basophils Absolute: 0 10*3/uL (ref 0.0–0.2)
Basos: 0 %
EOS (ABSOLUTE): 0 10*3/uL (ref 0.0–0.4)
Eos: 0 %
Hematocrit: 41.9 % (ref 34.0–46.6)
Hemoglobin: 13 g/dL (ref 11.1–15.9)
Immature Grans (Abs): 0.1 10*3/uL (ref 0.0–0.1)
Immature Granulocytes: 1 %
Lymphocytes Absolute: 1 10*3/uL (ref 0.7–3.1)
Lymphs: 8 %
MCH: 25.4 pg — ABNORMAL LOW (ref 26.6–33.0)
MCHC: 31 g/dL — ABNORMAL LOW (ref 31.5–35.7)
MCV: 82 fL (ref 79–97)
Monocytes Absolute: 0.4 10*3/uL (ref 0.1–0.9)
Monocytes: 3 %
Neutrophils Absolute: 12.2 10*3/uL — ABNORMAL HIGH (ref 1.4–7.0)
Neutrophils: 88 %
Platelets: 375 10*3/uL (ref 150–450)
RBC: 5.11 x10E6/uL (ref 3.77–5.28)
RDW: 20.7 % — ABNORMAL HIGH (ref 11.7–15.4)
WBC: 13.7 10*3/uL — ABNORMAL HIGH (ref 3.4–10.8)

## 2021-01-19 LAB — CMP14+EGFR
ALT: 27 IU/L (ref 0–32)
AST: 22 IU/L (ref 0–40)
Albumin/Globulin Ratio: 1.2 (ref 1.2–2.2)
Albumin: 3.8 g/dL (ref 3.7–4.7)
Alkaline Phosphatase: 100 IU/L (ref 44–121)
BUN/Creatinine Ratio: 29 — ABNORMAL HIGH (ref 12–28)
BUN: 15 mg/dL (ref 8–27)
Bilirubin Total: 0.3 mg/dL (ref 0.0–1.2)
CO2: 27 mmol/L (ref 20–29)
Calcium: 9.8 mg/dL (ref 8.7–10.3)
Chloride: 96 mmol/L (ref 96–106)
Creatinine, Ser: 0.51 mg/dL — ABNORMAL LOW (ref 0.57–1.00)
Globulin, Total: 3.1 g/dL (ref 1.5–4.5)
Glucose: 113 mg/dL — ABNORMAL HIGH (ref 65–99)
Potassium: 4.8 mmol/L (ref 3.5–5.2)
Sodium: 137 mmol/L (ref 134–144)
Total Protein: 6.9 g/dL (ref 6.0–8.5)
eGFR: 97 mL/min/{1.73_m2} (ref >59)

## 2021-01-23 ENCOUNTER — Encounter: Payer: Self-pay | Admitting: Neurology

## 2021-01-23 ENCOUNTER — Ambulatory Visit: Payer: Medicare Other | Admitting: Neurology

## 2021-01-23 ENCOUNTER — Other Ambulatory Visit: Payer: Self-pay

## 2021-01-23 VITALS — BP 163/75 | HR 69 | Ht 64.0 in | Wt 103.0 lb

## 2021-01-23 DIAGNOSIS — R4189 Other symptoms and signs involving cognitive functions and awareness: Secondary | ICD-10-CM | POA: Diagnosis not present

## 2021-01-23 DIAGNOSIS — M6289 Other specified disorders of muscle: Secondary | ICD-10-CM

## 2021-01-23 DIAGNOSIS — M316 Other giant cell arteritis: Secondary | ICD-10-CM | POA: Diagnosis not present

## 2021-01-23 DIAGNOSIS — F32A Depression, unspecified: Secondary | ICD-10-CM

## 2021-01-23 NOTE — Addendum Note (Signed)
Addended by: Hendricks Limes F on: 01/23/2021 01:09 PM   Modules accepted: Orders

## 2021-01-23 NOTE — Progress Notes (Signed)
I reviewed the    [facility]  PATIENT: Mckenzie [name] DOB: 01/22/1946  REFERRING DOCTOR OR PCP:  Dr. Gladstone Lighter ([facility]); Hendricks Limes. FNP (PCP) SOURCE: Patient, notes from Dr. Gladstone Lighter, available lab work  _________________________________   HISTORICAL  CHIEF COMPLAINT:  Chief Complaint  Patient presents with  . Follow-up    RM 12 with husband. Last seen 09/26/20 for OV.  Ambulates with rolling walker. She is doing OT/PT right now. 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 Told to hold insulin right now. Blood sugar levels low in the am.    HISTORY OF PRESENT ILLNESS:  Mckenzie [name] is a 75 y.o. woman with weakness and cognitive changes who was recently diagnosed with giant cell arteritis.    She is on prednisone for giant cell arteritis, started in March 2022 during a hospital admission (admitted with further weakness and hypotension).   Biopsy from 12/15/20 was consistent with GCA on bilateral specimens.She was started on prednisone and is currently on prednisone 50 mg po qD.    She is now seeexaing Dr. Benjamine Mola (Rheumatology).  He is managing the prednisone and has discussed anti-IL6R therapy as a steroid sparing option.  Interestingly, ESR was normal December 2021 (though CRP was elevated) but ESR was elevated in March 2022 when she was in the hospital prompting the biopsy.    Her weakness has mostly been proximal and EMG/NCV showed chronic radiculopathies but no evidence of a neuromuscular disorder.  Acetylcholine receptor antibodies were negative.  She is doing better now.  She still cannot rise out of the chair without using her hands but is able to go much longer distances with a walker and feels that her shoulder strength is back to normal.  MRI of the lumbar spine did show multilevel degenerative changes though probably not severe enough to cause her issues though they may contribute some.  Her myalgias are also much  better.   Mood is doing better on Lexapro and she denies depression now.   20 mg helped much more than 10 mg.  Cpgnition is doing better.    Continue Vit D 50,000 U for total 6 months then go to 2000 U daily.      Imaging: MRI of the lumbar spine performed at [facility] 10/04/2020.  It shows multilevel degenerative changes.    At L2-L3, there is advanced facet hypertrophy with ligamenta flava hypertrophy and minimal retrolisthesis.  There is moderate spinal stenosis.  Mild to moderate foraminal narrowing and moderate lateral recess stenosis.  No definite nerve root compression.  At L3-L4, there is advanced facet hypertrophy, ligamenta flava hypertrophy, disc protrusion all combining to cause moderate spinal stenosis, mild to moderate foraminal narrowing, moderately severe lateral recess stenosis with potential for L4 nerve root compression.  At L4-L5, there is 4 mm anterolisthesis, facet hypertrophy.  Mild foraminal narrowing and moderate left and moderately severe right lateral recess stenosis.  Possible right L5 nerve root compression.  L5-S1 shows facet hypertrophy but no nerve root compression.     REVIEW OF SYSTEMS: Constitutional: No fevers, chills, sweats, or change in appetite Eyes: No visual changes, double vision, eye pain Ear, nose and throat: No hearing loss, ear pain, nasal congestion, sore throat Cardiovascular: No chest pain, palpitations Respiratory: No shortness of breath at rest or with exertion.   No wheezes GastrointestinaI: No nausea, vomiting, diarrhea, abdominal pain, fecal incontinence Genitourinary: No dysuria, urinary retention or frequency.  No nocturia. Musculoskeletal: No neck pain,  back pain Integumentary: No rash, pruritus, skin lesions Neurological: as above Psychiatric: No depression at this time.  No anxiety Endocrine: No palpitations, diaphoresis, change in appetite, change in weigh or increased thirst Hematologic/Lymphatic: No anemia, purpura,  petechiae. Allergic/Immunologic: No itchy/runny eyes, nasal congestion, recent allergic reactions, rashes  ALLERGIES: Allergies  Allergen Reactions  . Aldomet [Methyldopa] Other (See Comments)    flu  . Fosamax [Alendronate Sodium]   . Acetazolamide Rash  . Buspar [Buspirone] Palpitations    HOME MEDICATIONS:  Current Outpatient Medications:  .  Accu-Chek Softclix Lancets lancets, USE UP TO FOUR TIMES DAILY AS DIRECTED (Patient taking differently: USE UP TO FOUR TIMES DAILY AS DIRECTED), Disp: 100 each, Rfl: 0 .  amLODipine (NORVASC) 2.5 MG tablet, Take 1 tablet (2.5 mg total) by mouth daily., Disp: 90 tablet, Rfl: 1 .  aspirin 81 MG EC tablet, TAKE 1 TABLET (81 MG TOTAL) BY MOUTH DAILY. SWALLOW WHOLE. (Patient taking differently: Take by mouth 5 (five) times daily. SWALLOW WHOLE.), Disp: 30 tablet, Rfl: 0 .  colchicine 0.6 MG tablet, Take 1 tablet (0.6 mg total) by mouth daily., Disp: 30 tablet, Rfl: 0 .  COMBIGAN 0.2-0.5 % ophthalmic solution, Place 1 drop into both eyes every 12 (twelve) hours., Disp: , Rfl:  .  dorzolamide (TRUSOPT) 2 % ophthalmic solution, Place 1 drop into both eyes 2 (two) times daily., Disp: , Rfl:  .  escitalopram (LEXAPRO) 20 MG tablet, Take 1 tablet (20 mg total) by mouth daily., Disp: 90 tablet, Rfl: 1 .  ferrous sulfate 325 (65 FE) MG tablet, Take 325 mg by mouth 2 (two) times daily with a meal., Disp: , Rfl:  .  fluorometholone (FML) 0.1 % ophthalmic suspension, Place 1 drop into the right eye 2 (two) times daily., Disp: , Rfl:  .  hydroxypropyl methylcellulose / hypromellose (ISOPTO TEARS / GONIOVISC) 2.5 % ophthalmic solution, Place 1 drop into both eyes 3 (three) times daily as needed for dry eyes., Disp: , Rfl:  .  latanoprost (XALATAN) 0.005 % ophthalmic solution, Place 1 drop into both eyes at bedtime., Disp: , Rfl:  .  lubiprostone (AMITIZA) 8 MCG capsule, Take one cap with food once or twice daily to maintain regular bowel movements., Disp: 60  capsule, Rfl: 2 .  MAGNESIUM PO, Take by mouth., Disp: , Rfl:  .  Multiple Vitamin (MULTIVITAMIN) tablet, Take 1 tablet by mouth daily., Disp: , Rfl:  .  Netarsudil Dimesylate (RHOPRESSA) 0.02 % SOLN, Place 1 drop into both eyes at bedtime., Disp: , Rfl:  .  pantoprazole (PROTONIX) 40 MG tablet, TAKE 1 TABLET (40 MG TOTAL) BY MOUTH DAILY., Disp: 30 tablet, Rfl: 0 .  prednisoLONE acetate (PRED FORTE) 1 % ophthalmic suspension, Place 1 drop into the left eye daily., Disp: , Rfl:  .  predniSONE (DELTASONE) 10 MG tablet, SEE ATTACHED SHEET, Disp: 250 tablet, Rfl: 0 .  PROLENSA 0.07 % SOLN, Place 1 drop into the right eye daily., Disp: , Rfl:  .  Vitamin D, Ergocalciferol, (DRISDOL) 1.25 MG (50000 UNIT) CAPS capsule, Take 1 capsule (50,000 Units total) by mouth every 7 (seven) days. (Patient taking differently: Take 50,000 Units by mouth every Thursday.), Disp: 13 capsule, Rfl: 1 .  glucose blood test strip, USE UP TO FOUR TIMES DAILY AS DIRECTED (Patient not taking: Reported on 01/23/2021), Disp: 100 strip, Rfl: 0 .  insulin aspart (NOVOLOG) 100 UNIT/ML FlexPen, CHECK BLOOD SUGAR WITH MEALS, THREE TIMES DAILY. THEN FOLLOW SLIDING SCALE AS PROVIDED ON AFTER  VISIT SUMMARY (Patient not taking: Reported on 01/23/2021), Disp: 15 mL, Rfl: 0 .  Insulin Pen Needle 31G X 5 MM MISC, Use with insulin, three times daily (Patient not taking: Reported on 01/23/2021), Disp: 100 each, Rfl: 0 .  Insulin Pen Needle 32G X 4 MM MISC, USE WITH INSULIN, THREE TIMES DAILY (Patient not taking: Reported on 01/23/2021), Disp: 100 each, Rfl: 0  PAST MEDICAL HISTORY: Past Medical History:  Diagnosis Date  . Depression   . High cholesterol   . Hypertension   . Retinal micro-aneurysm of right eye     PAST SURGICAL HISTORY: Past Surgical History:  Procedure Laterality Date  . ARTERY BIOPSY Bilateral 12/15/2020   Procedure: BILATERAL TEMPORAL ARTERY BIOPSIES;  Surgeon: Angelia Mould, MD;  Location: [place];  Service:  Vascular;  Laterality: Bilateral;  . COLONOSCOPY WITH PROPOFOL N/A 12/09/2020   Procedure: COLONOSCOPY WITH PROPOFOL;  Surgeon: Gatha Mayer, MD;  Location: [facility] ENDOSCOPY;  Service: Endoscopy;  Laterality: N/A;  . ESOPHAGOGASTRODUODENOSCOPY (EGD) WITH PROPOFOL N/A 12/09/2020   Procedure: ESOPHAGOGASTRODUODENOSCOPY (EGD) WITH PROPOFOL;  Surgeon: Gatha Mayer, MD;  Location: [place];  Service: Endoscopy;  Laterality: N/A;  . EYE SURGERY    . FINGER SURGERY Left    RING FINGER  . IR THORACENTESIS ASP PLEURAL SPACE W/IMG GUIDE  12/13/2020  . OPEN REDUCTION INTERNAL FIXATION (ORIF) DISTAL RADIAL FRACTURE Left 10/05/2020   Procedure: OPEN REDUCTION INTERNAL FIXATION (ORIF) DISTAL RADIAL FRACTURE;  Surgeon: Shona Needles, MD;  Location: [place];  Service: Orthopedics;  Laterality: Left;  supraclavicular block  . POLYPECTOMY  12/09/2020   Procedure: POLYPECTOMY;  Surgeon: Gatha Mayer, MD;  Location: [facility] ENDOSCOPY;  Service: Endoscopy;;  . TUBAL LIGATION      FAMILY HISTORY: Family History  Problem Relation Age of Onset  . Hyperlipidemia Mother   . Hypertension Mother   . Diabetes Mother   . COPD Father   . Depression Sister   . Suicidality Brother   . Drug abuse Brother   . Healthy Daughter   . Healthy Daughter   . Healthy Daughter   . Colon cancer Neg Hx   . Celiac disease Neg Hx   . Inflammatory bowel disease Neg Hx     SOCIAL HISTORY:  Social History   Socioeconomic History  . Marital status: Married    Spouse name: Not on file  . Number of children: 3  . Years of education: 60  . Highest education level: Not on file  Occupational History  . Not on file  Tobacco Use  . Smoking status: Former Smoker    Types: Cigarettes  . Smokeless tobacco: Never Used  . Tobacco comment: for a short period  Vaping Use  . Vaping Use: Never used  Substance and Sexual Activity  . Alcohol use: No  . Drug use: No  . Sexual activity: Not on file  Other Topics Concern  . Not on file   Social History Narrative   Right handed   Caffeine use: coffee daily   Social Determinants of Health   Financial Resource Strain: Not on file  Food Insecurity: Not on file  Transportation Needs: Not on file  Physical Activity: Not on file  Stress: Not on file  Social Connections: Not on file  Intimate Partner Violence: Not on file     PHYSICAL EXAM  Vitals:   01/23/21 1038  BP: (!) 163/75  Pulse: 69  Weight: 103 lb (46.7 kg)  Height: 5' 4" (1.626  m)    Body mass index is 17.68 kg/m.   General: The patient is well-developed and well-nourished and in no acute distress  HEENT:  Head is [place]/AT.  Sclera are anicteric.     Neck:   The neck isnon- tender over the paraspinal and other muscles.  Range of motion was normal in the neck.    Skin: Extremities are without rash or  Edema.  Some bruising on arms    Neurologic Exam  Mental status: The patient is alert and oriented x 3 at the time of the examination. The patient has apparent normal recent and remote memory, with an apparently normal attention span and concentration ability.   Speech is normal.  Cranial nerves: Extraocular movements are full.Normal facial strength and sensation.   No ptosis. No obvious hearing deficits are noted.  Motor: Muscle bulk and tone are normal.  Strength was 4/5 in the iliopsoas muscles, 4+/5 in the quadriceps and shoulder and neck extensor muscles.   Shoulders now 5/5.    Strength is 5/5 distally..   Sensory: Sensory testing is intact to pinprick, soft touch and vibration sensation in all 4 extremities.  Coordination: Cerebellar testing reveals good finger-nose-finger and heel-to-shin bilaterally.  Gait and station: Station is normal.   Gait is normal. Tandem gait is normal. Romberg is negative.   Reflexes: Deep tendon reflexes are symmetric and normal bilaterally.        DIAGNOSTIC DATA (LABS, IMAGING, TESTING) - I reviewed patient records, labs, notes, testing and imaging myself  where available.  Lab Results  Component Value Date   WBC 13.7 (H) 01/18/2021   HGB 13.0 01/18/2021   HCT 41.9 01/18/2021   MCV 82 01/18/2021   PLT 375 01/18/2021      Component Value Date/Time   NA 137 01/18/2021 1219   K 4.8 01/18/2021 1219   CL 96 01/18/2021 1219   CO2 27 01/18/2021 1219   GLUCOSE 113 (H) 01/18/2021 1219   GLUCOSE 121 (H) 12/19/2020 0219   BUN 15 01/18/2021 1219   CREATININE 0.51 (L) 01/18/2021 1219   CREATININE 0.63 01/23/2018 0803   CALCIUM 9.8 01/18/2021 1219   PROT 6.9 01/18/2021 1219   ALBUMIN 3.8 01/18/2021 1219   AST 22 01/18/2021 1219   ALT 27 01/18/2021 1219   ALKPHOS 100 01/18/2021 1219   BILITOT 0.3 01/18/2021 1219   GFRNONAA >60 12/19/2020 0219   GFRNONAA 90 01/23/2018 0803   GFRAA 120 12/01/2020 1127   GFRAA 104 01/23/2018 0803   Lab Results  Component Value Date   CHOL 127 12/11/2020   HDL 27 (L) 12/11/2020   LDLCALC 82 12/11/2020   TRIG 88 12/11/2020   CHOLHDL 4.7 12/11/2020       ASSESSMENT AND PLAN  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HCC)  Proximal weakness of extremity  Cognitive change  Depression, unspecified depression type  1.   Will continue prednisone for now, may be able to lower dose of switch to Actemra or other steroid sparing agent.    2.   Continue physical therapy. 3.   Mood is much better on Lexapro.  She will continue. 4.   Return in 6 months or sooner if there are new or worsening neurologic symptoms.     A. Felecia Shelling, MD, [facility] 5/88/5027, 74:12 PM Certified in Neurology, Clinical Neurophysiology, Sleep Medicine and Neuroimaging  [facility] [address] [place], [place] [address] [phone] It is possible that the spinal stenosis is contributing to her symptoms and if  she does not continue to improve surgery should be considered.

## 2021-01-24 NOTE — Telephone Encounter (Signed)
Submitted Patient Assistance Application to [place] for [place] along with provider portion, PA, med list, and insurance card copy. Patient already submitted her portion directly to [facility] documents.  Fax# [phone] Phone#  [phone]  Knox Saliva, PharmD, MPH Clinical Pharmacist (Rheumatology and Pulmonology)

## 2021-01-25 NOTE — Telephone Encounter (Signed)
Submitted Patient Assistance Application to [place] for [place] on 01/24/21 along with provider portion, PA, med list, and insurance card copy. Patient already submitted her portion directly to [facility] documents.  Fax# [phone] Phone#  [phone]  Knox Saliva, PharmD, MPH Clinical Pharmacist (Rheumatology and Pulmonology)

## 2021-01-26 NOTE — Telephone Encounter (Signed)
Spoke with patient's husband about Actemra shortage in SQ formulation and that only IV is available. Discussed alternative, Kevzara, although patient does not have RA so may not be approved for her. Discussed that this medication would be long-term with the plan to taper off of prednisone.   He states he will reach out to his daughter who will return my call.  Knox Saliva, PharmD, MPH Clinical Pharmacist (Rheumatology and Pulmonology)

## 2021-01-30 NOTE — Telephone Encounter (Signed)
Spoke with [name] about [place]. Discussed that Actemra is FDA-approved for GCA and is only available as IV infusion currently.  Discussed that Darcus Pester has the same mechanism of action but is not FDA-approved for GCA and we would have to appeal through insurance and still pursue patient assistance which would allow her to get her medication for free.  Per [employer] benefits rep,  Actemra [id number], authorization is not required.  There is a 5% coinsurance through part B.  No administration or hospital fee. [place] is in-network for this J-code.  Ref# [id number]  Sent prescriber form for IV formulation to [facility] for patient assistance - discussed with rep that patient's BIV concluded 5% coinsurance.  Called [name] to advise about BIV and she requested that further communication be through her [phone]). She states she'd like to wait until application processes to make decision.  Knox Saliva, PharmD, MPH Clinical Pharmacist (Rheumatology and Pulmonology)

## 2021-01-30 NOTE — Telephone Encounter (Signed)
Spoke with [name] about [place]. Discussed that Actemra is FDA-approved for GCA and is only available as IV infusion currently.  Discussed that Darcus Pester has the same mechanism of action but is not FDA-approved for GCA and we would have to appeal through insurance and still pursue patient assistance which would allow her to get her medication for free.  Per [employer] benefits rep,  Actemra [id number], authorization is not required.  There is a 5% coinsurance through part B.  No administration or hospital fee. [place] is in-network for this J-code.  Ref# [id number]  Sent prescriber form for IV formulation to [facility] for patient assistance - discussed with rep that patient's BIV concluded 5% coinsurance.  Called [name] to advise about BIV and she requested that further communication be through her [phone]). She states she'd like to wait until application processes to make decision which may be another 7-10 business days. Will continue to f/u w [place]  Knox Saliva, PharmD, MPH Clinical Pharmacist (Rheumatology and Pulmonology)

## 2021-01-31 ENCOUNTER — Other Ambulatory Visit: Payer: Medicare Other

## 2021-01-31 ENCOUNTER — Other Ambulatory Visit: Payer: Self-pay

## 2021-01-31 DIAGNOSIS — E871 Hypo-osmolality and hyponatremia: Secondary | ICD-10-CM

## 2021-01-31 DIAGNOSIS — D638 Anemia in other chronic diseases classified elsewhere: Secondary | ICD-10-CM

## 2021-01-31 LAB — CBC WITH DIFFERENTIAL/PLATELET
Basophils Absolute: 0 10*3/uL (ref 0.0–0.2)
Basos: 0 %
EOS (ABSOLUTE): 0.1 10*3/uL (ref 0.0–0.4)
Eos: 0 %
Hematocrit: 36.9 % (ref 34.0–46.6)
Hemoglobin: 11.7 g/dL (ref 11.1–15.9)
Immature Grans (Abs): 0 10*3/uL (ref 0.0–0.1)
Immature Granulocytes: 0 %
Lymphocytes Absolute: 1.4 10*3/uL (ref 0.7–3.1)
Lymphs: 10 %
MCH: 26.1 pg — ABNORMAL LOW (ref 26.6–33.0)
MCHC: 31.7 g/dL (ref 31.5–35.7)
MCV: 82 fL (ref 79–97)
Monocytes Absolute: 0.7 10*3/uL (ref 0.1–0.9)
Monocytes: 5 %
Neutrophils Absolute: 11.8 10*3/uL — ABNORMAL HIGH (ref 1.4–7.0)
Neutrophils: 85 %
Platelets: 383 10*3/uL (ref 150–450)
RBC: 4.48 x10E6/uL (ref 3.77–5.28)
RDW: 19.7 % — ABNORMAL HIGH (ref 11.7–15.4)
WBC: 14.1 10*3/uL — ABNORMAL HIGH (ref 3.4–10.8)

## 2021-01-31 LAB — BMP8+EGFR
BUN/Creatinine Ratio: 21 (ref 12–28)
BUN: 13 mg/dL (ref 8–27)
CO2: 27 mmol/L (ref 20–29)
Calcium: 9.3 mg/dL (ref 8.7–10.3)
Chloride: 99 mmol/L (ref 96–106)
Creatinine, Ser: 0.61 mg/dL (ref 0.57–1.00)
Glucose: 156 mg/dL — ABNORMAL HIGH (ref 65–99)
Potassium: 3.6 mmol/L (ref 3.5–5.2)
Sodium: 140 mmol/L (ref 134–144)
eGFR: 93 mL/min/{1.73_m2} (ref >59)

## 2021-01-31 NOTE — Addendum Note (Signed)
Addended by: Loman Brooklyn on: 01/31/2021 07:54 AM   Modules accepted: Orders

## 2021-02-02 NOTE — Telephone Encounter (Addendum)
Spoke with Junie Panning at [facility], pt is APPROVED for medication assistance from 01/31/2021-01/31/2024 for IV formulation.  Medication will be shipped from [place] [phone]) to the site of care. Pharmacy can ship from Tuesday to Thursday, suggest 2 weeks before infusion.  IV formulation order can also be escribed to [facility] and then [facility] can be called to schedule delivery thereafter.  Patient's Medicare can still be billed for admin or hospital fee but NOT medication fee since it will be coming from patient assistance.  I have sent a message to Theadora Rama to establish plan for ordering moving forward  Knox Saliva, PharmD, MPH Clinical Pharmacist (Rheumatology and Pulmonology)

## 2021-02-05 ENCOUNTER — Telehealth: Payer: Medicare Other | Admitting: Psychiatry

## 2021-02-05 ENCOUNTER — Encounter: Payer: Self-pay | Admitting: Radiology

## 2021-02-05 ENCOUNTER — Other Ambulatory Visit: Payer: Self-pay | Admitting: Radiology

## 2021-02-05 DIAGNOSIS — M316 Other giant cell arteritis: Secondary | ICD-10-CM

## 2021-02-05 MED ORDER — PREDNISONE 10 MG PO TABS: 50.0000 mg | ORAL_TABLET | Freq: Every day | ORAL | 0 refills | Status: DC

## 2021-02-05 NOTE — Telephone Encounter (Signed)
Patient asking for Prednisone refill, okay to refill?

## 2021-02-06 ENCOUNTER — Other Ambulatory Visit: Payer: Self-pay | Admitting: Pharmacist

## 2021-02-06 DIAGNOSIS — Z79899 Other long term (current) drug therapy: Secondary | ICD-10-CM

## 2021-02-06 DIAGNOSIS — M316 Other giant cell arteritis: Secondary | ICD-10-CM

## 2021-02-06 NOTE — Telephone Encounter (Signed)
Called [facility] to schedule Actemra IV infusion to hospital. They requested I return call tomorrow as prescription has not yet been entered. Per rep earliest ship date is 02/20/21. Will route to [place] to help coordinate shipment while I'm OOO and preferably shipment as soon as next week if possible

## 2021-02-06 NOTE — Progress Notes (Addendum)
Patient will be newly starting Actemra infusion and due for updated orders. Infusion not yet scheduled. Will not be scheduled until medication is delivered from [place] [phone])  **patient approved to receive IV Actemra for free from [facility] patient assistance. Medication will have to be shipped from [place] every month to [facility] [address], [place], [place] [address], ATTN: Pharmacy**   Diagnosis: Giant cell arteritis  Dose: 6mg /kg every 4 weeks (maximum dose of 600mg ) - per patient's last recorded weight of 46.7kg, dose is 280mg   Last Clinic Visit: 01/08/21 Next Clinic Visit: not yet scheduled  Labs:  - CBC on 01/31/21  Showed elevated WBCs. Abs neutrophils also elevated, but appropriate to start Actemra since not <2000 - CMP on 01/18/21 showed low Scr but slightly improved from prior, elevated glucose at 113, LFTs wnl  TB Gold: negative on 01/02/21  Orders placed for Actemra 6mg /kg every 4 weeks x 3 doses along with premedication of Tylenol and Benadryl to be administered 30 minutes before medication infusion.  Standing CBC with diff/platelet and CMP with GFR orders placed to be drawn after 1st infusion and then every 2 months.  Lipid panel to be drawn with second infusion and then every 6 months. Next TB gold due March 2023  Called patient's husband and provided with phone number for: [facility] Medical Day [phone]) [place] . ATC daughter, [name] but unable to reach. Will send MyChart message  Knox Saliva, PharmD, MPH Clinical Pharmacist (Rheumatology and Pulmonology)  Addendum: Patient scheduled for Actemra infusion on 03/12/21

## 2021-02-06 NOTE — Telephone Encounter (Signed)
Spoke with [name] regarding Actemra approval including that insurance covers hospital and admin fee fully.  She states the family is agreeable to driving to [facility] to have Actemra infusion every 4 weeks. I have reached out to [facility] at [phone] to schedule shipment of Actemra to hospital. Order for Actemra placed today however will not schedule infusion until medication is delivered to hospital. [facility] requested I reach back out in 2 hours to schedule shipment to site of care  Daughter verbalized understanding.  Knox Saliva, PharmD, MPH Clinical Pharmacist (Rheumatology and Pulmonology)

## 2021-02-07 NOTE — Telephone Encounter (Signed)
Called [facility], to schedule shipment 02/21/21 to [facility], this is the earliest date. Order number is [id number]  Phone# [phone]

## 2021-02-13 ENCOUNTER — Ambulatory Visit: Payer: Medicare Other | Admitting: Gastroenterology

## 2021-02-13 NOTE — Telephone Encounter (Signed)
Spoke with Mckenzie [name] at [facility]; He tried to get the delivery date bumped up to the 17th, but unfortunately everything earlier is already filled. But I was able to confirm that we are still on track for delivery on the 18th.

## 2021-02-19 NOTE — Telephone Encounter (Signed)
Called [facility] for order update for Actemra - medication has not yet shipped. Rep states it will ship tomorrow morning and stated we can call tomorrow after 2pm for tracking number. Order still slated for 02/21/21 shipment

## 2021-02-21 ENCOUNTER — Encounter: Payer: Self-pay | Admitting: Pharmacist

## 2021-02-21 NOTE — Telephone Encounter (Signed)
Called [facility] to get tracking number for Actemra shipment. Order number is [id number]  Called twice and received voicemail after long holds, left detailed message with call office call back number. Will try again later today or tomorrow   Phone# [phone]

## 2021-02-21 NOTE — Telephone Encounter (Signed)
Actemra shipment confirmed by [facility] Pont[facility].  Knox Saliva, PharmD, MPH Clinical Pharmacist (Rheumatology and Pulmonology)

## 2021-02-21 NOTE — Telephone Encounter (Signed)
Called [place] and hospital received shipment of Actemra this morning at 10:28am - have reached out to our buyer to confirm prior to placing infusion order and advising patient to schedule  Knox Saliva, PharmD, MPH Clinical Pharmacist (Rheumatology and Pulmonology)

## 2021-02-21 NOTE — Progress Notes (Addendum)
Actemra received by [facility] Pontes. 1 vial of 80mg , 1 vial of 200mg , and 1 vial of 400mg .  Pharmacy sent incorrect qty.  We will plan to use: First infusion: 1 vial of 80mg  in addition to 1 vial of 200mg  to total 280mg  Second infusion: 280mg  drawn from 400mg  vial (there will be some wastage however it will prevent discarding medication). I've communicated this to [facility] intake dept and will try to route this to the right Medical Day personnel  Will plan to schedule shipment of medication for third infusion after the second infusion.  Knox Saliva, PharmD, MPH Clinical Pharmacist (Rheumatology and Pulmonology)

## 2021-02-23 NOTE — Addendum Note (Signed)
Addended by: Cassandria Anger on: 02/23/2021 12:10 PM   Modules accepted: Orders

## 2021-02-28 ENCOUNTER — Encounter: Payer: Self-pay | Admitting: Internal Medicine

## 2021-02-28 ENCOUNTER — Other Ambulatory Visit: Payer: Self-pay | Admitting: Radiology

## 2021-02-28 DIAGNOSIS — M316 Other giant cell arteritis: Secondary | ICD-10-CM

## 2021-02-28 MED ORDER — PREDNISONE 10 MG PO TABS: 50.0000 mg | ORAL_TABLET | Freq: Every day | ORAL | 0 refills | Status: DC

## 2021-02-28 NOTE — Telephone Encounter (Signed)
Next Visit: Nothing scheduled  Last Visit: 01/02/2021  Last Fill: 02/05/2021- Take 5 tablets (50 mg total) by mouth daily with breakfast.   DX: Giant cell arteritis  Okay to refill Prednisone?

## 2021-03-12 ENCOUNTER — Other Ambulatory Visit: Payer: Self-pay

## 2021-03-12 ENCOUNTER — Encounter: Payer: Self-pay | Admitting: Internal Medicine

## 2021-03-12 ENCOUNTER — Ambulatory Visit (HOSPITAL_COMMUNITY)
Admission: RE | Admit: 2021-03-12 | Discharge: 2021-03-12 | Disposition: A | Discharge status: Home / Self Care | Payer: Medicare Other | Source: Ambulatory Visit | Attending: Internal Medicine | Admitting: Internal Medicine

## 2021-03-12 DIAGNOSIS — M316 Other giant cell arteritis: Secondary | ICD-10-CM | POA: Insufficient documentation

## 2021-03-12 MED ORDER — ACETAMINOPHEN 325 MG PO TABS
ORAL_TABLET | ORAL | Status: AC
  Administered 2021-03-12: 650 mg via ORAL
  Filled 2021-03-12: qty 2

## 2021-03-12 MED ORDER — TOCILIZUMAB 400 MG/20ML IV SOLN
6.0000 mg/kg | INTRAVENOUS | Status: DC
  Administered 2021-03-12: 300 mg via INTRAVENOUS
  Filled 2021-03-12: qty 10

## 2021-03-12 MED ORDER — ACETAMINOPHEN 325 MG PO TABS: 650.0000 mg | ORAL_TABLET | ORAL | Status: DC

## 2021-03-12 MED ORDER — DIPHENHYDRAMINE HCL 25 MG PO CAPS
ORAL_CAPSULE | ORAL | Status: AC
  Administered 2021-03-12: 25 mg via ORAL
  Filled 2021-03-12: qty 1

## 2021-03-12 MED ORDER — DIPHENHYDRAMINE HCL 25 MG PO CAPS
25.0000 mg | ORAL_CAPSULE | ORAL | Status: DC
Start: 2021-03-12 — End: 2021-03-13

## 2021-03-12 NOTE — Telephone Encounter (Signed)
I spoke with daughter Mckenzie [name] who is a preferred point of contact, recommend we scheduled a clinic appointment within next 1 or 2 weeks to discuss medication and treatment plan. Can we call and schedule this?

## 2021-03-12 NOTE — Discharge Instructions (Signed)
Tocilizumab Injection What is this medicine? TOCILIZUMAB (TOE si LIZ ue mab) is used to treat rheumatoid arthritis and juvenile idiopathic arthritis. It may also be used to treat giant cell arteritis, cytokine release syndrome, and to slow a decrease in lung function for patients with systemic sclerosis-related lung disease. This medicine may be used for other purposes; ask your health care provider or pharmacist if you have questions. COMMON BRAND NAME(S): Actemra What should I tell my health care provider before I take this medicine? They need to know if you have any of these conditions:  cancer  diabetes (high blood sugar)  heart disease  hepatitis B or history of hepatitis B infection  high blood pressure  high cholesterol  immune system problems  infection especially a viral infection such as chickenpox, cold sores, or herpes  infection such as tuberculosis (TB) or other bacterial, fungal or viral infections  liver disease  low blood counts, like low white cell, platelet, or red cell counts  multiple sclerosis  recent or upcoming vaccine  stomach or intestine problems  stroke  an unusual or allergic reaction to tocilizumab, other medicines, foods, dyes, or preservatives  pregnant or trying to get pregnant  breast-feeding How should I use this medicine? This medicine is injected into a vein or under the skin. It is usually given by a health care provider in a hospital or clinic setting. If you get this medicine at home, you will be taught how to prepare and give it. Use exactly as directed. Take it as directed on the prescription label at the same time every day. Keep taking it unless your health care provider tells you to stop. If you use a pen, be sure to take off the outer needle cover before using the dose. It is important that you put your used needles and syringes in a special sharps container. Do not put them in a trash can. If you do not have a sharps  container, call your pharmacist or health care provider to get one. A special MedGuide will be given to you before each treatment or by the pharmacist with each prescription and refill. Be sure to read this information carefully each time. Talk to your health care provider regarding the use of this medicine in children. While the drug may be prescribed for children as young as 2 years for selected conditions, precautions do apply. Overdosage: If you think you have taken too much of this medicine contact a poison control center or emergency room at once. NOTE: This medicine is only for you. Do not share this medicine with others. What if I miss a dose? It is important not to miss your dose. Call your health care provider if you are unable to keep an appointment. If you give yourself the medicine at home and you miss a dose, take it as soon as you can. If it is almost time for your next dose, take only that dose. Do not take double or extra doses. Call your health care provider with questions. What may interact with this medicine? Do not take this medicine with any of the following medications:  live virus vaccines This medicine may also interact with the following medications:  biologic medicines such as abatacept, adalimumab, anakinra, certolizumab, etanercept, golimumab, infliximab, rituximab, secukinumab, ustekinumab  birth control pills  certain medicines for cholesterol like atorvastatin, lovastatin, and simvastatin  cyclosporine  omeprazole  steroid medicines like prednisone or cortisone  theophylline  vaccines  warfarin This list may not describe  all possible interactions. Give your health care provider a list of all the medicines, herbs, non-prescription drugs, or dietary supplements you use. Also tell them if you smoke, drink alcohol, or use illegal drugs. Some items may interact with your medicine. What should I watch for while using this medicine? Your condition will be  monitored carefully while you are receiving this medicine. Tell your health care provider if your symptoms do not start to get better or if they get worse. You may need blood work done while you are taking this medicine. You will be tested for tuberculosis (TB) before you start this medicine. If your doctor prescribes any medicine for TB, you should start taking the TB medicine before starting this medicine. Make sure to finish the full course of TB medicine. This medicine may increase your risk of getting an infection. Call your health care provider for advice if you get a fever, chills, sore throat, or other symptoms of a cold or flu. Do not treat yourself. Try to avoid being around people who are sick. Talk to your health care provider about your risk of cancer. You may be more at risk for certain types of cancers if you take this medicine. What side effects may I notice from receiving this medicine? Side effects that you should report to your doctor or health care professional as soon as possible:  allergic reactions (skin rash, itching or hives; swelling of the face, lips, or tongue)  changes in vision  increase in blood pressure  infection (fever, chills, cough, sore throat, pain or trouble passing urine)  light-colored stool  liver injury (dark yellow or brown urine; general ill feeling or flu-like symptoms; loss of appetite, right upper belly pain; unusually weak or tired; yellowing of the eyes or skin)  low red blood cell counts (trouble breathing; feeling faint; lightheaded, falls; unusually weak or tired)  pain, tingling, numbness in the hands or feet   tears in the stomach or intestines (fever; stomach pain; sudden change in bowel habits)  trouble breathing  unusual bruising or bleeding Side effects that usually do not require medical attention (report to your doctor or health care professional if they continue or are bothersome):  dizziness  headache  pain, redness, or  irritation at site where injected This list may not describe all possible side effects. Call your doctor for medical advice about side effects. You may report side effects to FDA at 1-800-FDA-1088. Where should I keep my medicine? Keep out of the reach of children and pets. If you are using this medicine at home, you will be instructed on how to store this medicine. Throw away any unused medicine after the expiration date on the label. To get rid of medicines that are no longer needed or have expired:  Take the medicine to a medicine take-back program. Check with your pharmacy or law enforcement to find a location.  If you cannot return the medicine, ask your pharmacist or health care provider how to get rid of this medicine safely. NOTE: This sheet is a summary. It may not cover all possible information. If you have questions about this medicine, talk to your doctor, pharmacist, or health care provider.  2021 Elsevier/Gold Standard (2020-01-26 13:04:19)

## 2021-03-18 ENCOUNTER — Encounter: Payer: Self-pay | Admitting: Family Medicine

## 2021-03-21 ENCOUNTER — Other Ambulatory Visit: Payer: Medicare Other

## 2021-03-25 NOTE — Progress Notes (Signed)
Office Visit Note  Patient: Mckenzie [name]             Date of Birth: 06-29-1946           MRN: [id number]             PCP: Loman Brooklyn, FNP Referring: Loman Brooklyn, FNP Visit Date: 03/26/2021   Subjective:  Follow-up (After visit with Dr. Katy Fitch earlier today, there are concerns with patient's prednisone and side effects. )   History of Present Illness: Mckenzie [name] is a 75 y.o. female here for follow up for giant cell arteritis currently on prednisone 50 mg p.o. daily and recently started Actemra infusion treatment 2 weeks ago.  After her initial visit prednisone taper was unsuccessful due to recurrence of fatigue proximal muscle strength weakness and elevated inflammatory markers so prednisone was increased back to high-dose and maintained there.  She feels her symptoms are well controlled though is having a lot of problems with side effects from the steroids.  She is having extensive bruising skin fragility and tearing and ophthalmology visit with Dr. Midge Aver earlier today demonstrated increased intraocular pressure to 26 of the right eye left side normal.  Review of Systems  Constitutional:  Positive for fatigue.  HENT:  Negative for mouth sores, mouth dryness and nose dryness.   Eyes:  Positive for itching, visual disturbance and dryness. Negative for pain.  Respiratory:  Negative for cough, hemoptysis, shortness of breath and difficulty breathing.   Cardiovascular:  Negative for chest pain, palpitations and swelling in legs/feet.  Gastrointestinal:  Positive for constipation. Negative for abdominal pain, blood in stool and diarrhea.  Endocrine: Negative for increased urination.  Genitourinary:  Negative for painful urination.  Musculoskeletal:  Negative for joint pain, joint pain, joint swelling, myalgias, muscle weakness, morning stiffness, muscle tenderness and myalgias.  Skin:  Negative for color change, rash and redness.  Allergic/Immunologic: Negative for  susceptible to infections.  Neurological:  Positive for weakness. Negative for dizziness, numbness, headaches and memory loss.  Hematological:  Positive for bruising/bleeding tendency. Negative for swollen glands.  Psychiatric/Behavioral:  Negative for confusion and sleep disturbance.    PMFS History:  Patient Active Problem List   Diagnosis Date Noted   Depression 01/23/2021   High risk medication use 01/02/2021   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[place]) 12/27/2020   Pericardial effusion 12/27/2020   Abnormal MRI, cervical spine 12/27/2020   Generalized weakness 12/27/2020   Steroid-induced hyperglycemia 12/27/2020   Malnutrition of moderate degree 12/11/2020   Pressure injury of skin 12/09/2020   Benign neoplasm of cecum    Benign neoplasm of descending colon    Hiatal hernia    Hyponatremia 12/08/2020   Thrombocytosis 12/08/2020   Hypoalbuminemia 12/08/2020   Memory loss 12/08/2020   Constipation 12/06/2020   Anemia of chronic disease 12/06/2020   Gait disturbance 11/14/2020   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 11/14/2020   Cognitive change 11/14/2020   Adjustment disorder with mixed anxiety and depressed mood 10/10/2020   Fracture 10/06/2020   Distal radius fracture 92/08/9416   Acute metabolic encephalopathy 40/81/4481   Proximal weakness of extremity 09/26/2020   Myalgia 09/26/2020   Fall at home, initial encounter 09/26/2020   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   Essential hypertension 01/11/2014   Vertigo 01/11/2014   Hyperlipidemia 01/11/2014    Past Medical History:  Diagnosis Date   Depression    High cholesterol    Hypertension    Retinal micro-aneurysm of right eye  Family History  Problem Relation Age of Onset   Hyperlipidemia Mother    Hypertension Mother    Diabetes Mother    COPD Father    Depression Sister    Suicidality Brother    Drug abuse Brother    Healthy Daughter    Healthy Daughter    Healthy Daughter    Colon cancer Neg Hx    Celiac disease Neg Hx     Inflammatory bowel disease Neg Hx    Past Surgical History:  Procedure Laterality Date   ARTERY BIOPSY Bilateral 12/15/2020   Procedure: BILATERAL TEMPORAL ARTERY BIOPSIES;  Surgeon: Angelia Mould, MD;  Location[facility] OR;  Service: Vascular;  Laterality: Bilateral;   COLONOSCOPY WITH PROPOFOL N/A 12/09/2020   Procedure: COLONOSCOPY WITH PROPOFOL;  Surgeon: Gatha Mayer, MD;  Location: [facility] ENDOSCOPY;  Service: Endoscopy;  Laterality: N/A;   ESOPHAGOGASTRODUODENOSCOPY (EGD) WITH PROPOFOL N/A 12/09/2020   Procedure: ESOPHAGOGASTRODUODENOSCOPY (EGD) WITH PROPOFOL;  Surgeon: Gatha Mayer, MD;  Location: [facility];  Service: Endoscopy;  Laterality: N/A;   EYE SURGERY     FINGER SURGERY Left    RING FINGER   IR THORACENTESIS ASP PLEURAL SPACE W/IMG GUIDE  12/13/2020   OPEN REDUCTION INTERNAL FIXATION (ORIF) DISTAL RADIAL FRACTURE Left 10/05/2020   Procedure: OPEN REDUCTION INTERNAL FIXATION (ORIF) DISTAL RADIAL FRACTURE;  Surgeon: Shona Needles, MD;  Location: [facility];  Service: Orthopedics;  Laterality: Left;  supraclavicular block   POLYPECTOMY  12/09/2020   Procedure: POLYPECTOMY;  Surgeon: Gatha Mayer, MD;  Location[facility] ENDOSCOPY;  Service: Endoscopy;;   TUBAL LIGATION     Social History   Social History Narrative   Right handed   Caffeine use: coffee daily   Immunization History  Administered Date(s) Administered   PFIZER(Purple Top)SARS-COV-2 Vaccination 11/11/2019, 12/06/2019, 10/31/2020   Pneumococcal Conjugate-13 08/21/2018   Pneumococcal Polysaccharide-23 07/07/2012   Td 05/31/2011   Tdap 10/07/2010, 05/31/2011     Objective: Vital Signs: BP (!) 175/73 (BP Location: Left Arm, Patient Position: Sitting, Cuff Size: Normal)   Pulse 76   Ht 5' 4.5" (1.638 m)   Wt 119 lb 9.6 oz (54.3 kg)   BMI 20.21 kg/m    Physical Exam Eyes:     Comments: Right conjunctival injection  Cardiovascular:     Rate and Rhythm: Normal rate and regular rhythm.  Pulmonary:      Effort: Pulmonary effort is normal.     Breath sounds: Normal breath sounds.  Skin:    General: Skin is warm and dry.     Comments: Diffusely friable skin, extensive senile purpura on upper lower extremities, multiple lower extremity skin tears  Neurological:     Mental Status: She is alert.     Deep Tendon Reflexes: Reflexes normal.     Comments: Upper and lower extremity strength 5 out of 5 throughout bilaterally Positional tremor in bilateral hands  Psychiatric:        Mood and Affect: Mood normal.     Musculoskeletal Exam:  Wrists full ROM no tenderness or swelling Fingers full ROM no tenderness or swelling     Investigation: No additional findings.  Imaging: No results found.  Recent Labs: Lab Results  Component Value Date   WBC 12.5 (H) 03/26/2021   HGB 13.4 03/26/2021   PLT 287 03/26/2021   NA 140 01/31/2021   K 3.6 01/31/2021   CL 99 01/31/2021   CO2 27 01/31/2021   GLUCOSE 156 (H) 01/31/2021   BUN 13  01/31/2021   CREATININE 0.61 01/31/2021   BILITOT 0.3 01/18/2021   ALKPHOS 100 01/18/2021   AST 22 01/18/2021   ALT 27 01/18/2021   PROT 6.9 01/18/2021   ALBUMIN 3.8 01/18/2021   CALCIUM 9.3 01/31/2021   GFRAA 120 12/01/2020   QFTBGOLDPLUS NEGATIVE 01/02/2021    Speciality Comments: No specialty comments available.  Procedures:  No procedures performed Allergies: Aldomet [methyldopa], Fosamax [alendronate sodium], Acetazolamide, and Buspar [buspirone]   Assessment / Plan:     Visit Diagnoses: 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[place]) - Plan: Sedimentation rate, C-reactive protein, predniSONE (DELTASONE) 10 MG tablet  GCA currently no clinical evidence of disease activity but is having multiple complications of high dose prednisone. Checking inflammatory markers today but will plan to start tapering down by 10 mg this week then by 5 mg per week until at 15 mg daily dose or until symptoms recur. Continuing Actemra infusion 300 mg q4wks.  High risk medication use -  Plan: CBC with Differential/Platelet  Checking CBC monitoring for neutropenia with new actemra treatment.  Steroid-induced hyperglycemia Secondary glaucoma, unspecified glaucoma stage, unspecified laterality  Discussed glaucoma and increased intraocular pressure with Dr. Katy Fitch, plan to taper prednisone significantly within next 2 months should mitigate risk of complications and he plans to follow up again in 8-9 weeks to monitor this. Apparently left eye previous trabeculectomy so pressure is okay but had postoperative complications so not eager to pursue this on right side.   Orders: Orders Placed This Encounter  Procedures   Sedimentation rate   C-reactive protein   CBC with Differential/Platelet   Meds ordered this encounter  Medications   predniSONE (DELTASONE) 10 MG tablet    Sig: Take 4 tablets (40 mg total) by mouth daily with breakfast for 7 days, THEN 3.5 tablets (35 mg total) daily with breakfast for 7 days, THEN 3 tablets (30 mg total) daily with breakfast for 7 days, THEN 2.5 tablets (25 mg total) daily with breakfast for 7 days, THEN 2 tablets (20 mg total) daily with breakfast for 7 days. Take five 10 mg tablets daily..    Dispense:  105 tablet    Refill:  0      Follow-Up Instructions: Return in about 4 weeks (around 04/23/2021) for GCA on actemra and prednisone tapering.   Collier Salina, MD  Note - This record has been created using [employer].  Chart creation errors have been sought, but may not always  have been located. Such creation errors do not reflect on  the standard of medical care.

## 2021-03-26 ENCOUNTER — Encounter: Payer: Self-pay | Admitting: Internal Medicine

## 2021-03-26 ENCOUNTER — Ambulatory Visit: Payer: Medicare Other | Admitting: Internal Medicine

## 2021-03-26 ENCOUNTER — Other Ambulatory Visit: Payer: Self-pay

## 2021-03-26 VITALS — BP 175/73 | HR 76 | Ht 64.5 in | Wt 119.6 lb

## 2021-03-26 DIAGNOSIS — R739 Hyperglycemia, unspecified: Secondary | ICD-10-CM

## 2021-03-26 DIAGNOSIS — Z79899 Other long term (current) drug therapy: Secondary | ICD-10-CM

## 2021-03-26 DIAGNOSIS — M316 Other giant cell arteritis: Secondary | ICD-10-CM | POA: Diagnosis not present

## 2021-03-26 DIAGNOSIS — H4050X0 Glaucoma secondary to other eye disorders, unspecified eye, stage unspecified: Secondary | ICD-10-CM | POA: Diagnosis not present

## 2021-03-26 DIAGNOSIS — T380X5A Adverse effect of glucocorticoids and synthetic analogues, initial encounter: Secondary | ICD-10-CM

## 2021-03-26 MED ORDER — PREDNISONE 10 MG PO TABS: ORAL_TABLET | ORAL | 0 refills | Status: DC

## 2021-03-26 NOTE — Patient Instructions (Addendum)
I recommend we try and decrease the prednisone fairly quickly since it is causing increase eye pressure on the right side that could cause permanent vision loss if not treated long term. Also skin damage, bruising, possibly dizziness.  We can follow up in 4 weeks or let us know if problems arise sooner to take a look.  Starting considering 6/21 as Day 1, prednisone decrease as follow: Week 1: 40 mg daily Week 2: 35 mg daily Week 3: 30 mg daily Week 4: 25 mg daily Week 5: 20 mg daily Week 6: 15 mg daily Week 7: 12.5 mg daily Week 8: 10 mg daily

## 2021-03-27 LAB — SEDIMENTATION RATE: Sed Rate: 2 mm/h (ref 0–30)

## 2021-03-27 LAB — CBC WITH DIFFERENTIAL/PLATELET
Absolute Monocytes: 188 cells/uL — ABNORMAL LOW (ref 200–950)
Basophils Absolute: 25 cells/uL (ref 0–200)
Basophils Relative: 0.2 %
Eosinophils Absolute: 13 cells/uL — ABNORMAL LOW (ref 15–500)
Eosinophils Relative: 0.1 %
HCT: 41.5 % (ref 35.0–45.0)
Hemoglobin: 13.4 g/dL (ref 11.7–15.5)
Lymphs Abs: 1113 cells/uL (ref 850–3900)
MCH: 29.1 pg (ref 27.0–33.0)
MCHC: 32.3 g/dL (ref 32.0–36.0)
MCV: 90 fL (ref 80.0–100.0)
MPV: 10.1 fL (ref 7.5–12.5)
Monocytes Relative: 1.5 %
Neutro Abs: 11163 cells/uL — ABNORMAL HIGH (ref 1500–7800)
Neutrophils Relative %: 89.3 %
Platelets: 287 10*3/uL (ref 140–400)
RBC: 4.61 10*6/uL (ref 3.80–5.10)
RDW: 14.5 % (ref 11.0–15.0)
Total Lymphocyte: 8.9 %
WBC: 12.5 10*3/uL — ABNORMAL HIGH (ref 3.8–10.8)

## 2021-03-27 LAB — C-REACTIVE PROTEIN: CRP: 0.5 mg/L (ref <8.0)

## 2021-03-27 NOTE — Progress Notes (Signed)
Blood tests look good to start decreasing the prednisone dose as we discussed in clinic and continue the Actemra.

## 2021-04-10 ENCOUNTER — Ambulatory Visit (HOSPITAL_COMMUNITY)
Admission: RE | Admit: 2021-04-10 | Discharge: 2021-04-10 | Disposition: A | Discharge status: Home / Self Care | Payer: Medicare Other | Source: Ambulatory Visit | Attending: Internal Medicine | Admitting: Internal Medicine

## 2021-04-10 ENCOUNTER — Other Ambulatory Visit: Payer: Self-pay

## 2021-04-10 DIAGNOSIS — M316 Other giant cell arteritis: Secondary | ICD-10-CM | POA: Diagnosis not present

## 2021-04-10 DIAGNOSIS — Z79899 Other long term (current) drug therapy: Secondary | ICD-10-CM

## 2021-04-10 LAB — COMPREHENSIVE METABOLIC PANEL
ALT: 22 U/L (ref 0–44)
AST: 19 U/L (ref 15–41)
Albumin: 3.3 g/dL — ABNORMAL LOW (ref 3.5–5.0)
Alkaline Phosphatase: 75 U/L (ref 38–126)
Anion gap: 9 (ref 5–15)
BUN: 8 mg/dL (ref 8–23)
CO2: 29 mmol/L (ref 22–32)
Calcium: 9.5 mg/dL (ref 8.9–10.3)
Chloride: 98 mmol/L (ref 98–111)
Creatinine, Ser: 0.59 mg/dL (ref 0.44–1.00)
GFR, Estimated: >60 mL/min (ref >60)
Glucose, Bld: 105 mg/dL — ABNORMAL HIGH (ref 70–99)
Potassium: 4 mmol/L (ref 3.5–5.1)
Sodium: 136 mmol/L (ref 135–145)
Total Bilirubin: 0.6 mg/dL (ref 0.3–1.2)
Total Protein: 6.5 g/dL (ref 6.5–8.1)

## 2021-04-10 LAB — CBC WITH DIFFERENTIAL/PLATELET
Abs Immature Granulocytes: 0.19 10*3/uL — ABNORMAL HIGH (ref 0.00–0.07)
Basophils Absolute: 0 10*3/uL (ref 0.0–0.1)
Basophils Relative: 0 %
Eosinophils Absolute: 0 10*3/uL (ref 0.0–0.5)
Eosinophils Relative: 0 %
HCT: 39.2 % (ref 36.0–46.0)
Hemoglobin: 12.6 g/dL (ref 12.0–15.0)
Immature Granulocytes: 1 %
Lymphocytes Relative: 8 %
Lymphs Abs: 1.1 10*3/uL (ref 0.7–4.0)
MCH: 30.2 pg (ref 26.0–34.0)
MCHC: 32.1 g/dL (ref 30.0–36.0)
MCV: 94 fL (ref 80.0–100.0)
Monocytes Absolute: 0.3 10*3/uL (ref 0.1–1.0)
Monocytes Relative: 2 %
Neutro Abs: 11.5 10*3/uL — ABNORMAL HIGH (ref 1.7–7.7)
Neutrophils Relative %: 89 %
Platelets: 309 10*3/uL (ref 150–400)
RBC: 4.17 MIL/uL (ref 3.87–5.11)
RDW: 14.9 % (ref 11.5–15.5)
WBC: 13.2 10*3/uL — ABNORMAL HIGH (ref 4.0–10.5)
nRBC: 0 % (ref 0.0–0.2)

## 2021-04-10 LAB — LIPID PANEL
Cholesterol: 319 mg/dL — ABNORMAL HIGH (ref 0–200)
HDL: 63 mg/dL (ref >40)
LDL Cholesterol: 208 mg/dL — ABNORMAL HIGH (ref 0–99)
Total CHOL/HDL Ratio: 5.1 RATIO
Triglycerides: 241 mg/dL — ABNORMAL HIGH (ref <150)
VLDL: 48 mg/dL — ABNORMAL HIGH (ref 0–40)

## 2021-04-10 MED ORDER — DIPHENHYDRAMINE HCL 25 MG PO CAPS
25.0000 mg | ORAL_CAPSULE | ORAL | Status: DC
Start: 2021-04-10 — End: 2021-04-11

## 2021-04-10 MED ORDER — ACETAMINOPHEN 325 MG PO TABS: 650.0000 mg | ORAL_TABLET | ORAL | Status: DC

## 2021-04-10 MED ORDER — TOCILIZUMAB 400 MG/20ML IV SOLN
6.0000 mg/kg | INTRAVENOUS | Status: DC
  Administered 2021-04-10: 280 mg via INTRAVENOUS
  Filled 2021-04-10: qty 14

## 2021-04-12 NOTE — Progress Notes (Signed)
Called [place] to schedule shipment of Actemra to [facility]. Confirmed address for delivery.  Confirmed dose of 6mg /kg based on last recorded weight of 54.9 kg (approximately 329mg )  Scheduled Actemra shipment to arrives to [facility] on 04/20/21 Order number: [id number]  Phone: [phone]  Knox Saliva, PharmD, MPH Clinical Pharmacist (Rheumatology and Pulmonology)

## 2021-04-12 NOTE — Progress Notes (Signed)
Lab tests show lipids are significantly increased this is an effect of the Actemra and is not associated with increased risk of cardiovascular events while on treatments. White blood cells remain high probably from prednisone.

## 2021-04-17 NOTE — Progress Notes (Signed)
[facility] and verified that medication is scheduled to be shipped on 7/14 and delivered to [place]. ([place]) by 7/15.

## 2021-04-24 NOTE — Progress Notes (Signed)
Office Visit Note  Patient: Mckenzie [name]             Date of Birth: [birth year]           MRN: [id number]             PCP: Loman Brooklyn, FNP Referring: Loman Brooklyn, FNP Visit Date: 04/25/2021   Subjective:  Follow-up (Patient is currently taking Prednisone 20 mg daily. Patient feels as if skin spots have improved greatly. )   History of Present Illness: Mckenzie [name] is a 74 y.o. female here for follow up for GCA on Actemra IV q4wks and prednisone taper with current target of 20mg  daly dose. At our last visit she was experiencing increased bruising and skin tears from the high dose prednisone.  Since her last visit she feels partial improvement in her skin symptoms with decreased areas of skin tearing and bruising though she still has some unresolved wound on her bilateral shins.  She feels she has had some minor increase in cognitive or concentration difficulties.  Her ophthalmology follow-up was concern for ongoing finding she has been referred apparently to [facility] ophthalmology but not yet scheduled any appointment for this.  Previous HPI: 03/26/21 Mckenzie [name] is a 75 y.o. female here for follow up for giant cell arteritis currently on prednisone 50 mg p.o. daily and recently started Actemra infusion treatment 2 weeks ago.  After her initial visit prednisone taper was unsuccessful due to recurrence of fatigue proximal muscle strength weakness and elevated inflammatory markers so prednisone was increased back to high-dose and maintained there.  She feels her symptoms are well controlled though is having a lot of problems with side effects from the steroids.  She is having extensive bruising skin fragility and tearing and ophthalmology visit with Dr. Midge Aver earlier today demonstrated increased intraocular pressure to 26 of the right eye left side normal.  01/02/21 Mckenzie [name] is a 75 y.o. female here for giant cell arteritis. Symptoms seem to extend back to last  year with developing severe stiffness and aches in her upper and lower extremities with progressive decrease in mobility and function. She was hospitalized in December after fall with distal radius fracture. After that she continued overall decline and was admitted to the hospital again in March with hyponatremia affecting weakness and mental status. Workup revealed transudative pericardial effusion also descending thoracic aorta wall thickening and high inflammatory markers concerning for GCA. She underewnt bilateral temporal artery biopsies with pathology consistent with GCA. She started high dose steroids with slow tapering on prednisone and discharged home. Since that time she and her husband have noticed a very large improvement in her status mentally and physically. She is continuing the recommended therapy exercises at home although not in formal PT or home PT yet. Currently at 40mg  per day dose. She denies significant headache, jaw pain besides chronic TMJ, or visual changes. She reports ophthalmology follow up for her glaucoma noted actually decreased pressures on the prednisone. Cardiology follow up indicates partial reduction of effusion.   12/2020 Vit D 36.27 WBC 12.5 Plt 617   Biopsy from 12/15/20 was consistent with GCA on bilateral specimens.  Review of Systems  Constitutional:  Negative for fatigue.  HENT:  Negative for mouth sores, mouth dryness and nose dryness.   Eyes:  Negative for pain, itching, visual disturbance and dryness.  Respiratory:  Negative for cough, hemoptysis, shortness of breath and difficulty breathing.   Cardiovascular:  Negative for chest  pain, palpitations and swelling in legs/feet.  Gastrointestinal:  Positive for diarrhea. Negative for abdominal pain, blood in stool and constipation.  Endocrine: Negative for increased urination.  Genitourinary:  Negative for painful urination.  Musculoskeletal:  Positive for muscle weakness. Negative for joint pain, joint  pain, joint swelling, myalgias, morning stiffness, muscle tenderness and myalgias.  Skin:  Negative for color change, rash and redness.  Allergic/Immunologic: Negative for susceptible to infections.  Neurological:  Negative for dizziness, numbness, headaches, memory loss and weakness.  Hematological:  Negative for swollen glands.  Psychiatric/Behavioral:  Positive for confusion. Negative for sleep disturbance.    PMFS History:  Patient Active Problem List   Diagnosis Date Noted   Depression 01/23/2021   High risk medication use 01/02/2021   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[place]) 12/27/2020   Pericardial effusion 12/27/2020   Abnormal MRI, cervical spine 12/27/2020   Generalized weakness 12/27/2020   Steroid-induced hyperglycemia 12/27/2020   Malnutrition of moderate degree 12/11/2020   Pressure injury of skin 12/09/2020   Benign neoplasm of cecum    Benign neoplasm of descending colon    Hiatal hernia    Hyponatremia 12/08/2020   Thrombocytosis 12/08/2020   Hypoalbuminemia 12/08/2020   Memory loss 12/08/2020   Constipation 12/06/2020   Anemia of chronic disease 12/06/2020   Gait disturbance 11/14/2020   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 11/14/2020   Cognitive change 11/14/2020   Adjustment disorder with mixed anxiety and depressed mood 10/10/2020   Fracture 10/06/2020   Distal radius fracture 13/24/4010   Acute metabolic encephalopathy 27/25/3664   Proximal weakness of extremity 09/26/2020   Myalgia 09/26/2020   Fall at home, initial encounter 09/26/2020   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   Essential hypertension 01/11/2014   Vertigo 01/11/2014   Hyperlipidemia 01/11/2014    Past Medical History:  Diagnosis Date   Depression    High cholesterol    Hypertension    Retinal micro-aneurysm of right eye     Family History  Problem Relation Age of Onset   Hyperlipidemia Mother    Hypertension Mother    Diabetes Mother    COPD Father    Depression Sister    Suicidality Brother     Drug abuse Brother    Healthy Daughter    Healthy Daughter    Healthy Daughter    Colon cancer Neg Hx    Celiac disease Neg Hx    Inflammatory bowel disease Neg Hx    Past Surgical History:  Procedure Laterality Date   ARTERY BIOPSY Bilateral 12/15/2020   Procedure: BILATERAL TEMPORAL ARTERY BIOPSIES;  Surgeon: Angelia Mould, MD;  Location[facility] OR;  Service: Vascular;  Laterality: Bilateral;   COLONOSCOPY WITH PROPOFOL N/A 12/09/2020   Procedure: COLONOSCOPY WITH PROPOFOL;  Surgeon: Gatha Mayer, MD;  Location: [facility] ENDOSCOPY;  Service: Endoscopy;  Laterality: N/A;   ESOPHAGOGASTRODUODENOSCOPY (EGD) WITH PROPOFOL N/A 12/09/2020   Procedure: ESOPHAGOGASTRODUODENOSCOPY (EGD) WITH PROPOFOL;  Surgeon: Gatha Mayer, MD;  Location: [place];  Service: Endoscopy;  Laterality: N/A;   EYE SURGERY     FINGER SURGERY Left    RING FINGER   IR THORACENTESIS ASP PLEURAL SPACE W/IMG GUIDE  12/13/2020   OPEN REDUCTION INTERNAL FIXATION (ORIF) DISTAL RADIAL FRACTURE Left 10/05/2020   Procedure: OPEN REDUCTION INTERNAL FIXATION (ORIF) DISTAL RADIAL FRACTURE;  Surgeon: Shona Needles, MD;  Location: [place];  Service: Orthopedics;  Laterality: Left;  supraclavicular block   POLYPECTOMY  12/09/2020   Procedure: POLYPECTOMY;  Surgeon: Gatha Mayer, MD;  Location:  [place] ENDOSCOPY;  Service: Endoscopy;;   TUBAL LIGATION     Social History   Social History Narrative   Right handed   Caffeine use: coffee daily   Immunization History  Administered Date(s) Administered   PFIZER(Purple Top)SARS-COV-2 Vaccination 11/11/2019, 12/06/2019, 10/31/2020   Pneumococcal Conjugate-13 08/21/2018   Pneumococcal Polysaccharide-23 07/07/2012   Td 05/31/2011   Tdap 10/07/2010, 05/31/2011     Objective: Vital Signs: BP (!) 171/81 (BP Location: Left Arm, Patient Position: Sitting, Cuff Size: Normal)   Pulse 69   Ht 5\' 5"  (1.651 m)   Wt 126 lb (57.2 kg)   BMI 20.97 kg/m    Physical Exam Eyes:      Comments: Bilateral conjunctival injection  Cardiovascular:     Rate and Rhythm: Normal rate and regular rhythm.  Pulmonary:     Effort: Pulmonary effort is normal.     Breath sounds: Normal breath sounds.  Skin:    General: Skin is warm and dry.     Comments: Extensive areas of purpura on all extremities skin tear areas with wound dressing in place on bilateral shins No peripheral edema  Neurological:     General: No focal deficit present.     Mental Status: She is alert.     Comments: Strength 5/5 throughout, bilateral positional tremor in hands  Psychiatric:        Mood and Affect: Mood normal.     Musculoskeletal Exam: Shoulders full ROM no tenderness or swelling Elbows full ROM no tenderness or swelling Knees full ROM no tenderness or swelling  Investigation: No additional findings.  Imaging: No results found.  Recent Labs: Lab Results  Component Value Date   WBC 13.2 (H) 04/10/2021   HGB 12.6 04/10/2021   PLT 309 04/10/2021   NA 136 04/10/2021   K 4.0 04/10/2021   CL 98 04/10/2021   CO2 29 04/10/2021   GLUCOSE 105 (H) 04/10/2021   BUN 8 04/10/2021   CREATININE 0.59 04/10/2021   BILITOT 0.6 04/10/2021   ALKPHOS 75 04/10/2021   AST 19 04/10/2021   ALT 22 04/10/2021   PROT 6.5 04/10/2021   ALBUMIN 3.3 (L) 04/10/2021   CALCIUM 9.5 04/10/2021   GFRAA 120 12/01/2020   QFTBGOLDPLUS NEGATIVE 01/02/2021    Speciality Comments: Actemra started 03/12/21 (receives through [place] patient assistance)  Procedures:  No procedures performed Allergies: Aldomet [methyldopa], Fosamax [alendronate sodium], Acetazolamide, and Buspar [buspirone]   Assessment / Plan:     Visit Diagnoses: 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[place]) - Plan: Sedimentation rate, CBC with Differential/Platelet, COMPLETE METABOLIC PANEL WITH GFR  No current symptoms suggesting GCA or PMR activity at this time.  We will check sedimentation rate today.  Plan to continue Actemra every 4 weeks intravenous dosing.   Assuming okay labs will continue prednisone taper would recommend schedule 20 mg this week and then start tapering down smaller increments: 15 mg, 12.5 mg, 10 mg, 7.5 mg, 5 mg.  High risk medication use  Risk of continued medication use she has significant hyperlipidemia but this is well reported effect of Actemra has not shown significant increase in associated cardiovascular risk.  Skin symptoms are improving somewhat with the prednisone tapering.  Intraocular pressures she has close follow-up with ophthalmology for this.  We will recheck CBC and CMP today we will continue to adjust treatment.  Secondary glaucoma, unspecified glaucoma stage, unspecified laterality  Secondary to glaucoma likely with current exacerbation due to high-dose oral prednisone.  She is following up with Dr. Katy Fitch has  been referred to [facility] ophthalmology for this and multiple concurrent issues as well.  Denies current significant visual changes.  Orders: Orders Placed This Encounter  Procedures   Sedimentation rate   CBC with Differential/Platelet   COMPLETE METABOLIC PANEL WITH GFR   No orders of the defined types were placed in this encounter.    Follow-Up Instructions: Return in about 1 month (around 05/29/2021) for GCA Actemra+GCs taper f/u 5 wks.   Collier Salina, MD  Note - This record has been created using [employer].  Chart creation errors have been sought, but may not always  have been located. Such creation errors do not reflect on  the standard of medical care.

## 2021-04-25 ENCOUNTER — Encounter: Payer: Self-pay | Admitting: Internal Medicine

## 2021-04-25 ENCOUNTER — Ambulatory Visit: Payer: Medicare Other | Admitting: Internal Medicine

## 2021-04-25 ENCOUNTER — Other Ambulatory Visit: Payer: Self-pay

## 2021-04-25 VITALS — BP 171/81 | HR 69 | Ht 65.0 in | Wt 126.0 lb

## 2021-04-25 DIAGNOSIS — M316 Other giant cell arteritis: Secondary | ICD-10-CM | POA: Diagnosis not present

## 2021-04-25 DIAGNOSIS — H4050X0 Glaucoma secondary to other eye disorders, unspecified eye, stage unspecified: Secondary | ICD-10-CM | POA: Diagnosis not present

## 2021-04-25 DIAGNOSIS — Z79899 Other long term (current) drug therapy: Secondary | ICD-10-CM | POA: Diagnosis not present

## 2021-04-25 LAB — CBC WITH DIFFERENTIAL/PLATELET
Absolute Monocytes: 389 cells/uL (ref 200–950)
Basophils Absolute: 40 cells/uL (ref 0–200)
Basophils Relative: 0.3 %
Eosinophils Absolute: 13 cells/uL — ABNORMAL LOW (ref 15–500)
Eosinophils Relative: 0.1 %
HCT: 40.8 % (ref 35.0–45.0)
Hemoglobin: 13.2 g/dL (ref 11.7–15.5)
Lymphs Abs: 1085 cells/uL (ref 850–3900)
MCH: 30.1 pg (ref 27.0–33.0)
MCHC: 32.4 g/dL (ref 32.0–36.0)
MCV: 93.2 fL (ref 80.0–100.0)
MPV: 10.3 fL (ref 7.5–12.5)
Monocytes Relative: 2.9 %
Neutro Abs: 11872 cells/uL — ABNORMAL HIGH (ref 1500–7800)
Neutrophils Relative %: 88.6 %
Platelets: 268 10*3/uL (ref 140–400)
RBC: 4.38 10*6/uL (ref 3.80–5.10)
RDW: 14.3 % (ref 11.0–15.0)
Total Lymphocyte: 8.1 %
WBC: 13.4 10*3/uL — ABNORMAL HIGH (ref 3.8–10.8)

## 2021-04-25 LAB — COMPLETE METABOLIC PANEL WITH GFR
AG Ratio: 2 (calc) (ref 1.0–2.5)
ALT: 28 U/L (ref 6–29)
AST: 22 U/L (ref 10–35)
Albumin: 4.3 g/dL (ref 3.6–5.1)
Alkaline phosphatase (APISO): 67 U/L (ref 37–153)
BUN: 15 mg/dL (ref 7–25)
CO2: 33 mmol/L — ABNORMAL HIGH (ref 20–32)
Calcium: 9.8 mg/dL (ref 8.6–10.4)
Chloride: 100 mmol/L (ref 98–110)
Creat: 0.66 mg/dL (ref 0.60–1.00)
Globulin: 2.2 g/dL (calc) (ref 1.9–3.7)
Glucose, Bld: 100 mg/dL — ABNORMAL HIGH (ref 65–99)
Potassium: 4.3 mmol/L (ref 3.5–5.3)
Sodium: 139 mmol/L (ref 135–146)
Total Bilirubin: 0.6 mg/dL (ref 0.2–1.2)
Total Protein: 6.5 g/dL (ref 6.1–8.1)
eGFR: 91 mL/min/{1.73_m2} (ref >=60)

## 2021-04-25 LAB — SEDIMENTATION RATE: Sed Rate: 2 mm/h (ref 0–30)

## 2021-04-27 MED ORDER — PREDNISONE 5 MG PO TABS: ORAL_TABLET | ORAL | 0 refills | Status: DC

## 2021-04-27 NOTE — Progress Notes (Signed)
Lab tests look okay the white blood cell count remains high this is from the prednisone. I am sending new prescriptions with the updated tapering instructions like we talked about with the 5 mg tablets to make it easier.

## 2021-04-27 NOTE — Addendum Note (Signed)
Addended by: Collier Salina on: 04/27/2021 10:47 AM   Modules accepted: Orders

## 2021-04-27 NOTE — Addendum Note (Signed)
Addended by: Collier Salina on: 04/27/2021 08:20 AM   Modules accepted: Orders

## 2021-04-30 ENCOUNTER — Other Ambulatory Visit: Payer: Self-pay

## 2021-04-30 MED ORDER — VITAMIN D2 50 MCG (2000 UT) PO TABS: 1.0000 | ORAL_TABLET | Freq: Every day | ORAL | 6 refills | Status: AC

## 2021-05-01 ENCOUNTER — Other Ambulatory Visit: Payer: Medicare Other

## 2021-05-01 ENCOUNTER — Other Ambulatory Visit: Payer: Self-pay

## 2021-05-08 ENCOUNTER — Encounter (HOSPITAL_COMMUNITY)
Admission: RE | Admit: 2021-05-08 | Discharge: 2021-05-08 | Disposition: A | Discharge status: Home / Self Care | Payer: Medicare Other | Source: Ambulatory Visit | Attending: Internal Medicine | Admitting: Internal Medicine

## 2021-05-08 ENCOUNTER — Other Ambulatory Visit: Payer: Self-pay

## 2021-05-08 DIAGNOSIS — Z79899 Other long term (current) drug therapy: Secondary | ICD-10-CM

## 2021-05-08 DIAGNOSIS — M316 Other giant cell arteritis: Secondary | ICD-10-CM | POA: Diagnosis not present

## 2021-05-08 LAB — CBC WITH DIFFERENTIAL/PLATELET
Abs Immature Granulocytes: 0.11 10*3/uL — ABNORMAL HIGH (ref 0.00–0.07)
Basophils Absolute: 0 10*3/uL (ref 0.0–0.1)
Basophils Relative: 0 %
Eosinophils Absolute: 0.1 10*3/uL (ref 0.0–0.5)
Eosinophils Relative: 0 %
HCT: 39.1 % (ref 36.0–46.0)
Hemoglobin: 12.6 g/dL (ref 12.0–15.0)
Immature Granulocytes: 1 %
Lymphocytes Relative: 9 %
Lymphs Abs: 1.2 10*3/uL (ref 0.7–4.0)
MCH: 30.4 pg (ref 26.0–34.0)
MCHC: 32.2 g/dL (ref 30.0–36.0)
MCV: 94.2 fL (ref 80.0–100.0)
Monocytes Absolute: 0.6 10*3/uL (ref 0.1–1.0)
Monocytes Relative: 5 %
Neutro Abs: 11.7 10*3/uL — ABNORMAL HIGH (ref 1.7–7.7)
Neutrophils Relative %: 85 %
Platelets: 280 10*3/uL (ref 150–400)
RBC: 4.15 MIL/uL (ref 3.87–5.11)
RDW: 14.5 % (ref 11.5–15.5)
WBC: 13.7 10*3/uL — ABNORMAL HIGH (ref 4.0–10.5)
nRBC: 0 % (ref 0.0–0.2)

## 2021-05-08 LAB — COMPREHENSIVE METABOLIC PANEL
ALT: 22 U/L (ref 0–44)
AST: 21 U/L (ref 15–41)
Albumin: 3.3 g/dL — ABNORMAL LOW (ref 3.5–5.0)
Alkaline Phosphatase: 68 U/L (ref 38–126)
Anion gap: 5 (ref 5–15)
BUN: 10 mg/dL (ref 8–23)
CO2: 28 mmol/L (ref 22–32)
Calcium: 9.5 mg/dL (ref 8.9–10.3)
Chloride: 100 mmol/L (ref 98–111)
Creatinine, Ser: 0.56 mg/dL (ref 0.44–1.00)
GFR, Estimated: >60 mL/min (ref >60)
Glucose, Bld: 127 mg/dL — ABNORMAL HIGH (ref 70–99)
Potassium: 4 mmol/L (ref 3.5–5.1)
Sodium: 133 mmol/L — ABNORMAL LOW (ref 135–145)
Total Bilirubin: 0.8 mg/dL (ref 0.3–1.2)
Total Protein: 6.5 g/dL (ref 6.5–8.1)

## 2021-05-08 MED ORDER — DIPHENHYDRAMINE HCL 25 MG PO CAPS: 25.0000 mg | ORAL_CAPSULE | ORAL | Status: DC

## 2021-05-08 MED ORDER — TOCILIZUMAB 400 MG/20ML IV SOLN
6.0000 mg/kg | INTRAVENOUS | Status: DC
  Administered 2021-05-08: 280 mg via INTRAVENOUS
  Filled 2021-05-08: qty 4

## 2021-05-08 MED ORDER — ACETAMINOPHEN 325 MG PO TABS: 650.0000 mg | ORAL_TABLET | ORAL | Status: DC

## 2021-05-09 ENCOUNTER — Encounter: Payer: Self-pay | Admitting: Internal Medicine

## 2021-05-10 ENCOUNTER — Other Ambulatory Visit: Payer: Medicare Other

## 2021-05-10 ENCOUNTER — Other Ambulatory Visit: Payer: Self-pay

## 2021-05-11 ENCOUNTER — Telehealth: Payer: Self-pay | Admitting: Pharmacist

## 2021-05-11 ENCOUNTER — Other Ambulatory Visit: Payer: Self-pay | Admitting: Pharmacist

## 2021-05-11 DIAGNOSIS — M316 Other giant cell arteritis: Secondary | ICD-10-CM

## 2021-05-11 DIAGNOSIS — Z79899 Other long term (current) drug therapy: Secondary | ICD-10-CM

## 2021-05-11 NOTE — Telephone Encounter (Signed)
Called [facility] today to schedule shipment of Actemra to hospital. However I was notified that Actemra PFS and pen formulation is now available for patient's.  Spoke with patient's husband who is comfortable with being trained for Actemra self-admin at [place] with Dr. Benjamine Mola on 04/22/21. He has been advised that I will plan to train him and his wife at that visit with a demo.  Rx for Actemra ACTpen can be sent to [facility] once dose is confirmed. For GCA, dose would be '162mg'$  once weekly however will route to Dr. Benjamine Mola for dosing at every other week given her hyperlipidemia after starting Actemra. Husband has been advised that [facility] may reach out to him to schedule shipment and he was provided with [facility] pharmacy phone number to save.  Patient received Actemra IV infusion on 05/08/21 and her next dose will be due on or after 06/05/21.   ATC [facility]x to confirm Actemra availability but unable to reach and left VM requesting return call to clinic. Will f/u on Monday  Knox Saliva, PharmD, MPH, BCPS Clinical Pharmacist (Rheumatology and Pulmonology)

## 2021-05-11 NOTE — Progress Notes (Addendum)
Next infusion scheduled for Actemra on 06/05/21 and due for updated orders. Patient unable to transition to self-admin due to supply issue (re-confirmed with [place] patient assistance program). Husband has been made aware of this.  Diagnosis: GCA  Dose: 6 mg/kg every 28 days  Last Clinic Visit: 04/25/21 Next Clinic Visit: 05/23/21  Last infusion: 05/08/21 Labs: CMP on 05/08/21 TB Gold: negative on 01/02/21   Orders placed for Actemra '6mg'$ /kg x 3 doses along with premedication of Tylenol and Benadryl to be administered 30 minutes before medication infusion.  Standing CBC with diff/platelet and CMP with GFR orders placed to be drawn every 2 months.  Next TB gold due March 2023  Called Actemra to schedule shipment to [facility]. Email sent to intake pharmacy with expected delivery on 05/29/21. Order number: [id number] [facility] Phone: [phone]  Knox Saliva, PharmD, MPH, BCPS Clinical Pharmacist (Rheumatology and Pulmonology)

## 2021-05-12 ENCOUNTER — Encounter: Payer: Self-pay | Admitting: Internal Medicine

## 2021-05-14 MED ORDER — ACTEMRA ACTPEN 162 MG/0.9ML ~~LOC~~ SOAJ: 162.0000 mg | SUBCUTANEOUS | 0 refills | Status: DC

## 2021-05-14 NOTE — Telephone Encounter (Signed)
Confirmed with Celso Amy today that subcutaneous formulation (pen and pre-filled syringe) is now available. Patient has appt with Dr. Benjamine Mola on 05/23/21. Current delivery date is set for 05/23/21 therefore would not arrive to clinic on time.  Patient's husband is amenable to being trained with demo and self-administering dose on 06/05/21. Last infusion was 05/08/21.  Dose is '162mg'$  SQ every week (dose appropriate and confirmed by Dr. Benjamine Mola that we will train patient and husband at [place] on 05/23/21). Rx sent to [facility] with note that patient is transitioning to self-admin.  Knox Saliva, PharmD, MPH, BCPS Clinical Pharmacist (Rheumatology and Pulmonology)

## 2021-05-15 NOTE — Addendum Note (Signed)
Addended by: Cassandria Anger on: 05/15/2021 10:14 AM   Modules accepted: Orders, SmartSet

## 2021-05-16 NOTE — Telephone Encounter (Signed)
Received call from [place] stating that patient is inelgible for self-admin formulartion Actemra. Rep states that previous rep provided incorrect information. Since she is enrolled in patient assistance due to unaffordable copay, she is among other patients who are not being prioritized for self-admin. Patients who are uninsured are being prioritized for self-admin formulation.  They have added patient to their list of patient to transition to self-admin when possible. Notified patient's husband that he should keep infusion center appointment. Apologized for confusion. He expressed understanding.  He also had question about prednisone taper. States that Dr. Benjamine Mola verbally explained patient would be tapering prednisone every 2 weeks but rx, AVS, and office notes state weekly. Patient has been tapering prednisone weekly as prescribed. Advised that this is the plan as confirmed by Dr. Marveen Reeks most recent OV note.  Knox Saliva, PharmD, MPH, BCPS Clinical Pharmacist (Rheumatology and Pulmonology)

## 2021-05-20 ENCOUNTER — Encounter: Payer: Self-pay | Admitting: Internal Medicine

## 2021-05-22 NOTE — Progress Notes (Addendum)
Office Visit Note  Patient: Mckenzie [name]             Date of Birth: [birth year]           MRN: [id number]             PCP: Loman Brooklyn, FNP Referring: Loman Brooklyn, FNP Visit Date: 05/23/2021   Subjective:  Follow-up (Patient has reduced Prednisone to 10 mg daily and has continued Actemra every 4 weeks. Patient fell x 2 weeks, she was evaluated by EMS and advised she did not need further care. )   History of Present Illness: Mckenzie [name] is a 75 y.o. female here for follow up for GCA currently on Actemra and prednisone 10 mg PO daily. Sicne our last visit she experienced increase pain and stiffness in the back and hips and sustained a fall about 2 weeks ago.  She was overall doing pretty well felt her treatment control symptoms adequately until after this fall.  She struck her head with superficial injury ENT exam was not concerned for complications and no imaging or further work-up obtained.  A week after the fall she is now experiencing increasing pain affecting her chest and in her very low back and sacrum area.  The chest pain is not particularly affected by deep inspiration only sometimes also with increased pain reaching overhead.  Her low back pain is worsened with standing from a seated position.  She denies any radiation to the legs weakness or decreased sensation in her legs.  Previous HPI: 04/25/21 Mckenzie [name] is a 75 y.o. female here for follow up for GCA on Actemra IV q4wks and prednisone taper with current target of '20mg'$  daly dose. At our last visit she was experiencing increased bruising and skin tears from the high dose prednisone.  Since her last visit she feels partial improvement in her skin symptoms with decreased areas of skin tearing and bruising though she still has some unresolved wound on her bilateral shins.  She feels she has had some minor increase in cognitive or concentration difficulties.  Her ophthalmology follow-up was concern for ongoing  finding she has been referred apparently to [facility] ophthalmology but not yet scheduled any appointment for this.   03/26/21 Mckenzie [name] is a 75 y.o. female here for follow up for giant cell arteritis currently on prednisone 50 mg p.o. daily and recently started Actemra infusion treatment 2 weeks ago.  After her initial visit prednisone taper was unsuccessful due to recurrence of fatigue proximal muscle strength weakness and elevated inflammatory markers so prednisone was increased back to high-dose and maintained there.  She feels her symptoms are well controlled though is having a lot of problems with side effects from the steroids.  She is having extensive bruising skin fragility and tearing and ophthalmology visit with Dr. Midge Aver earlier today demonstrated increased intraocular pressure to 26 of the right eye left side normal.   01/02/21 Mckenzie [name] is a 75 y.o. female here for giant cell arteritis. Symptoms seem to extend back to last year with developing severe stiffness and aches in her upper and lower extremities with progressive decrease in mobility and function. She was hospitalized in December after fall with distal radius fracture. After that she continued overall decline and was admitted to the hospital again in March with hyponatremia affecting weakness and mental status. Workup revealed transudative pericardial effusion also descending thoracic aorta wall thickening and high inflammatory markers concerning for GCA. She underewnt  bilateral temporal artery biopsies with pathology consistent with GCA. She started high dose steroids with slow tapering on prednisone and discharged home. Since that time she and her husband have noticed a very large improvement in her status mentally and physically. She is continuing the recommended therapy exercises at home although not in formal PT or home PT yet. Currently at '40mg'$  per day dose. She denies significant headache, jaw pain besides chronic  TMJ, or visual changes. She reports ophthalmology follow up for her glaucoma noted actually decreased pressures on the prednisone. Cardiology follow up indicates partial reduction of effusion.   12/2020 Vit D 36.27 WBC 12.5 Plt 617   Biopsy from 12/15/20 was consistent with GCA on bilateral specimens.  Review of Systems  Constitutional:  Positive for fatigue.  HENT:  Negative for mouth sores, mouth dryness and nose dryness.   Eyes:  Positive for pain, itching, visual disturbance and dryness.  Respiratory:  Positive for cough. Negative for hemoptysis, shortness of breath and difficulty breathing.   Cardiovascular:  Positive for swelling in legs/feet. Negative for chest pain and palpitations.  Gastrointestinal:  Negative for abdominal pain, blood in stool, constipation and diarrhea.  Endocrine: Positive for increased urination.  Genitourinary:  Negative for painful urination.  Musculoskeletal:  Positive for joint pain, joint pain, joint swelling, myalgias, muscle weakness, morning stiffness, muscle tenderness and myalgias.  Skin:  Negative for color change, rash and redness.  Allergic/Immunologic: Negative for susceptible to infections.  Neurological:  Positive for weakness. Negative for dizziness, numbness, headaches and memory loss.  Hematological:  Negative for swollen glands.  Psychiatric/Behavioral:  Positive for confusion. Negative for sleep disturbance.    PMFS History:  Patient Active Problem List   Diagnosis Date Noted   Low back pain 05/23/2021   Depression 01/23/2021   High risk medication use 01/02/2021   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[place]) 12/27/2020   Pericardial effusion 12/27/2020   Abnormal MRI, cervical spine 12/27/2020   Generalized weakness 12/27/2020   Steroid-induced hyperglycemia 12/27/2020   Malnutrition of moderate degree 12/11/2020   Pressure injury of skin 12/09/2020   Benign neoplasm of cecum    Benign neoplasm of descending colon    Hiatal hernia     Hyponatremia 12/08/2020   Thrombocytosis 12/08/2020   Hypoalbuminemia 12/08/2020   Memory loss 12/08/2020   Constipation 12/06/2020   Anemia of chronic disease 12/06/2020   Gait disturbance 11/14/2020   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 11/14/2020   Cognitive change 11/14/2020   Adjustment disorder with mixed anxiety and depressed mood 10/10/2020   Fracture 10/06/2020   Distal radius fracture [id number]   Acute metabolic encephalopathy [id number]   Proximal weakness of extremity 09/26/2020   Myalgia 09/26/2020   Fall at home, subsequent encounter 09/26/2020   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   Essential hypertension 01/11/2014   Vertigo 01/11/2014   Hyperlipidemia 01/11/2014    Past Medical History:  Diagnosis Date   Depression    High cholesterol    Hypertension    Retinal micro-aneurysm of right eye     Family History  Problem Relation Age of Onset   Hyperlipidemia Mother    Hypertension Mother    Diabetes Mother    COPD Father    Depression Sister    Suicidality Brother    Drug abuse Brother    Healthy Daughter    Healthy Daughter    Healthy Daughter    Colon cancer Neg Hx    Celiac disease Neg Hx    Inflammatory bowel disease Neg Hx  Past Surgical History:  Procedure Laterality Date   ARTERY BIOPSY Bilateral 12/15/2020   Procedure: BILATERAL TEMPORAL ARTERY BIOPSIES;  Surgeon: Angelia Mould, MD;  Location: [facility] OR;  Service: Vascular;  Laterality: Bilateral;   COLONOSCOPY WITH PROPOFOL N/A 12/09/2020   Procedure: COLONOSCOPY WITH PROPOFOL;  Surgeon: Gatha Mayer, MD;  Location: [place];  Service: Endoscopy;  Laterality: N/A;   ESOPHAGOGASTRODUODENOSCOPY (EGD) WITH PROPOFOL N/A 12/09/2020   Procedure: ESOPHAGOGASTRODUODENOSCOPY (EGD) WITH PROPOFOL;  Surgeon: Gatha Mayer, MD;  Location: [place];  Service: Endoscopy;  Laterality: N/A;   EYE SURGERY     FINGER SURGERY Left    RING FINGER   IR THORACENTESIS ASP PLEURAL SPACE W/IMG GUIDE  12/13/2020    OPEN REDUCTION INTERNAL FIXATION (ORIF) DISTAL RADIAL FRACTURE Left 10/05/2020   Procedure: OPEN REDUCTION INTERNAL FIXATION (ORIF) DISTAL RADIAL FRACTURE;  Surgeon: Shona Needles, MD;  Location: [place];  Service: Orthopedics;  Laterality: Left;  supraclavicular block   POLYPECTOMY  12/09/2020   Procedure: POLYPECTOMY;  Surgeon: Gatha Mayer, MD;  Location: [facility] ENDOSCOPY;  Service: Endoscopy;;   TUBAL LIGATION     Social History   Social History Narrative   Right handed   Caffeine use: coffee daily   Immunization History  Administered Date(s) Administered   PFIZER(Purple Top)SARS-COV-2 Vaccination 11/11/2019, 12/06/2019, 10/31/2020   Pneumococcal Conjugate-13 08/21/2018   Pneumococcal Polysaccharide-23 07/07/2012   Td 05/31/2011   Tdap 10/07/2010, 05/31/2011     Objective: Vital Signs: BP (!) 170/82 (BP Location: Left Arm, Patient Position: Sitting, Cuff Size: Normal)   Pulse 78   Ht '5\' 5"'$  (1.651 m)   Wt 129 lb (58.5 kg)   BMI 21.47 kg/m    Physical Exam Cardiovascular:     Rate and Rhythm: Normal rate and regular rhythm.  Pulmonary:     Effort: Pulmonary effort is normal.     Breath sounds: Normal breath sounds.  Skin:    General: Skin is warm and dry.     Comments: Multiple scattered bruising small skin tears on lower extremities with adhesive bandages Small erythematous patch appears like superficial bruise or abrasion on the scalp with no soft tissue swelling or tenderness  Neurological:     General: No focal deficit present.     Mental Status: She is alert.     Comments: Strength is 5 out of 5 intact in bilateral shoulder abduction and hip flexion effort more limited by back pain against resistance Positional tremor in extremities     Musculoskeletal Exam:  Shoulders full ROM no tenderness or swelling, resisted abduction provokes back pain Elbows full ROM no tenderness or swelling Wrists full ROM no tenderness or swelling Fingers full ROM no tenderness or  swelling Low back tenderness to pressure over sacrum around midline and some paraspinal muscles   Investigation: No additional findings.  Imaging: No results found.  Recent Labs: Lab Results  Component Value Date   WBC 13.7 (H) 05/08/2021   HGB 12.6 05/08/2021   PLT 280 05/08/2021   NA 133 (L) 05/08/2021   K 4.0 05/08/2021   CL 100 05/08/2021   CO2 28 05/08/2021   GLUCOSE 127 (H) 05/08/2021   BUN 10 05/08/2021   CREATININE 0.56 05/08/2021   BILITOT 0.8 05/08/2021   ALKPHOS 68 05/08/2021   AST 21 05/08/2021   ALT 22 05/08/2021   PROT 6.5 05/08/2021   ALBUMIN 3.3 (L) 05/08/2021   CALCIUM 9.5 05/08/2021   GFRAA 120 12/01/2020   QFTBGOLDPLUS NEGATIVE 01/02/2021  Speciality Comments: Actemra started 03/12/21 (receives through [place] patient assistance)  Procedures:  No procedures performed Allergies: Aldomet [methyldopa], Fosamax [alendronate sodium], Acetazolamide, and Buspar [buspirone]   Assessment / Plan:     Visit Diagnoses: 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[place]) - Plan: Sedimentation rate  I believe the current symptoms are unlikely to represent  exacerbation of the giant cell arteritis or PMR featuresShe has intact strength no limitation to the shoulder and hip movement suggesting the bursitis or tendinitis type of restriction in these areas pain is more referred to the back.  Checking sedimentation rate today. Continuing Actemra infusion every 4 weeks discussed plan to continue tapering the prednisone at a low rate will probably recommend incremental down to 7.5 then 5 mg unless new complication identified.  High risk medication use  Will need repeat labs for Actemra treatment in next follow-up.  Fall at home, subsequent encounter Acute midline low back pain without sciatica - Plan: DG Chest 2 View, DG Sacrum/Coccyx  No obvious severe injury associated with her fall and the mechanism does not sound to severe I am not sure why her symptoms have worsened 1 week after the  event we will obtain plain x-rays of the chest and of the sacrum to rule out any possible fractures as she has at much increased risk with baseline poor bone density and long-term steroid treatment.  Secondary glaucoma, unspecified glaucoma stage, unspecified laterality  Elevated intraocular pressures exacerbated by the ongoing prednisone treatment we will continue to try tapering this she also has upcoming referral to [facility] ophthalmology scheduled on September 1 for additional management options.    Orders: Orders Placed This Encounter  Procedures   DG Chest 2 View   DG Sacrum/Coccyx   Sedimentation rate    No orders of the defined types were placed in this encounter.    Follow-Up Instructions: Return in about 2 months (around 07/23/2021) for GCA f/u 2 mos.   Collier Salina, MD  Note - This record has been created using [employer].  Chart creation errors have been sought, but may not always  have been located. Such creation errors do not reflect on  the standard of medical care.

## 2021-05-23 ENCOUNTER — Ambulatory Visit
Admission: RE | Admit: 2021-05-23 | Discharge: 2021-05-23 | Disposition: A | Discharge status: Home / Self Care | Payer: Medicare Other | Source: Ambulatory Visit | Attending: Internal Medicine | Admitting: Internal Medicine

## 2021-05-23 ENCOUNTER — Ambulatory Visit: Payer: Medicare Other | Admitting: Internal Medicine

## 2021-05-23 ENCOUNTER — Other Ambulatory Visit: Payer: Self-pay

## 2021-05-23 ENCOUNTER — Encounter: Payer: Self-pay | Admitting: Internal Medicine

## 2021-05-23 VITALS — BP 170/82 | HR 78 | Ht 65.0 in | Wt 129.0 lb

## 2021-05-23 DIAGNOSIS — H4050X0 Glaucoma secondary to other eye disorders, unspecified eye, stage unspecified: Secondary | ICD-10-CM

## 2021-05-23 DIAGNOSIS — Z79899 Other long term (current) drug therapy: Secondary | ICD-10-CM | POA: Diagnosis not present

## 2021-05-23 DIAGNOSIS — W19XXXD Unspecified fall, subsequent encounter: Secondary | ICD-10-CM

## 2021-05-23 DIAGNOSIS — Y92009 Unspecified place in unspecified non-institutional (private) residence as the place of occurrence of the external cause: Secondary | ICD-10-CM

## 2021-05-23 DIAGNOSIS — M545 Low back pain, unspecified: Secondary | ICD-10-CM | POA: Insufficient documentation

## 2021-05-23 DIAGNOSIS — M316 Other giant cell arteritis: Secondary | ICD-10-CM | POA: Diagnosis not present

## 2021-05-23 LAB — SEDIMENTATION RATE: Sed Rate: 6 mm/h (ref 0–30)

## 2021-05-24 ENCOUNTER — Telehealth: Payer: Self-pay | Admitting: Radiology

## 2021-05-24 NOTE — Telephone Encounter (Signed)
Spoke with patient's daughter, [name], she was calling as a follow-up to her previously sent MyChart message. Patient's daughter has some concerns regarding patient's increased symptoms and whether or not they are related to her fall or tapering the prednisone / her issues we treat her for. Advised patient CXR results are back, but we are still waiting on sacrum/coccyx and will call with results and plan moving forward by EOD tomorrow.   Encouraged patient's daughter to come to in-office appointments if she's able so she can continue to be well-informed face-to-face and stay involved in her mother's care. 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

## 2021-05-25 ENCOUNTER — Emergency Department (HOSPITAL_COMMUNITY): Payer: Medicare Other

## 2021-05-25 ENCOUNTER — Encounter (HOSPITAL_COMMUNITY): Payer: Self-pay | Admitting: Emergency Medicine

## 2021-05-25 ENCOUNTER — Observation Stay (HOSPITAL_COMMUNITY)
Admission: EM | Admit: 2021-05-25 | Discharge: 2021-05-27 | Disposition: A | Discharge status: Home / Self Care | Payer: Medicare Other | Attending: Internal Medicine | Admitting: Internal Medicine

## 2021-05-25 DIAGNOSIS — Z20822 Contact with and (suspected) exposure to covid-19: Secondary | ICD-10-CM | POA: Insufficient documentation

## 2021-05-25 DIAGNOSIS — R2681 Unsteadiness on feet: Secondary | ICD-10-CM | POA: Diagnosis not present

## 2021-05-25 DIAGNOSIS — Z87891 Personal history of nicotine dependence: Secondary | ICD-10-CM | POA: Diagnosis not present

## 2021-05-25 DIAGNOSIS — W19XXXA Unspecified fall, initial encounter: Secondary | ICD-10-CM | POA: Insufficient documentation

## 2021-05-25 DIAGNOSIS — Z79899 Other long term (current) drug therapy: Secondary | ICD-10-CM | POA: Insufficient documentation

## 2021-05-25 DIAGNOSIS — S22010A Wedge compression fracture of first thoracic vertebra, initial encounter for closed fracture: Secondary | ICD-10-CM | POA: Insufficient documentation

## 2021-05-25 DIAGNOSIS — S3210XA Unspecified fracture of sacrum, initial encounter for closed fracture: Principal | ICD-10-CM | POA: Insufficient documentation

## 2021-05-25 DIAGNOSIS — S2221XA Fracture of manubrium, initial encounter for closed fracture: Secondary | ICD-10-CM | POA: Diagnosis not present

## 2021-05-25 DIAGNOSIS — E1159 Type 2 diabetes mellitus with other circulatory complications: Secondary | ICD-10-CM | POA: Diagnosis present

## 2021-05-25 DIAGNOSIS — Z7982 Long term (current) use of aspirin: Secondary | ICD-10-CM | POA: Insufficient documentation

## 2021-05-25 DIAGNOSIS — I10 Essential (primary) hypertension: Secondary | ICD-10-CM | POA: Insufficient documentation

## 2021-05-25 DIAGNOSIS — Z794 Long term (current) use of insulin: Secondary | ICD-10-CM | POA: Diagnosis not present

## 2021-05-25 DIAGNOSIS — M316 Other giant cell arteritis: Secondary | ICD-10-CM

## 2021-05-25 DIAGNOSIS — S22040A Wedge compression fracture of fourth thoracic vertebra, initial encounter for closed fracture: Secondary | ICD-10-CM | POA: Diagnosis not present

## 2021-05-25 DIAGNOSIS — S3992XA Unspecified injury of lower back, initial encounter: Secondary | ICD-10-CM | POA: Diagnosis present

## 2021-05-25 LAB — COMPREHENSIVE METABOLIC PANEL
ALT: 23 U/L (ref 0–44)
AST: 22 U/L (ref 15–41)
Albumin: 3.7 g/dL (ref 3.5–5.0)
Alkaline Phosphatase: 208 U/L — ABNORMAL HIGH (ref 38–126)
Anion gap: 12 (ref 5–15)
BUN: 11 mg/dL (ref 8–23)
CO2: 25 mmol/L (ref 22–32)
Calcium: 9.6 mg/dL (ref 8.9–10.3)
Chloride: 101 mmol/L (ref 98–111)
Creatinine, Ser: 0.42 mg/dL — ABNORMAL LOW (ref 0.44–1.00)
GFR, Estimated: >60 mL/min (ref >60)
Glucose, Bld: 129 mg/dL — ABNORMAL HIGH (ref 70–99)
Potassium: 3.8 mmol/L (ref 3.5–5.1)
Sodium: 138 mmol/L (ref 135–145)
Total Bilirubin: 0.9 mg/dL (ref 0.3–1.2)
Total Protein: 6.4 g/dL — ABNORMAL LOW (ref 6.5–8.1)

## 2021-05-25 LAB — CBC WITH DIFFERENTIAL/PLATELET
Abs Immature Granulocytes: 0.05 10*3/uL (ref 0.00–0.07)
Basophils Absolute: 0 10*3/uL (ref 0.0–0.1)
Basophils Relative: 0 %
Eosinophils Absolute: 0 10*3/uL (ref 0.0–0.5)
Eosinophils Relative: 0 %
HCT: 42.9 % (ref 36.0–46.0)
Hemoglobin: 13.7 g/dL (ref 12.0–15.0)
Immature Granulocytes: 0 %
Lymphocytes Relative: 9 %
Lymphs Abs: 1 10*3/uL (ref 0.7–4.0)
MCH: 30.1 pg (ref 26.0–34.0)
MCHC: 31.9 g/dL (ref 30.0–36.0)
MCV: 94.3 fL (ref 80.0–100.0)
Monocytes Absolute: 0.4 10*3/uL (ref 0.1–1.0)
Monocytes Relative: 4 %
Neutro Abs: 9.6 10*3/uL — ABNORMAL HIGH (ref 1.7–7.7)
Neutrophils Relative %: 87 %
Platelets: 310 10*3/uL (ref 150–400)
RBC: 4.55 MIL/uL (ref 3.87–5.11)
RDW: 14.5 % (ref 11.5–15.5)
WBC: 11.1 10*3/uL — ABNORMAL HIGH (ref 4.0–10.5)
nRBC: 0 % (ref 0.0–0.2)

## 2021-05-25 MED ORDER — BRIMONIDINE TARTRATE 0.2 % OP SOLN
1.0000 [drp] | Freq: Two times a day (BID) | OPHTHALMIC | Status: DC
  Administered 2021-05-26 – 2021-05-27 (×3): 1 [drp] via OPHTHALMIC
  Filled 2021-05-25: qty 5

## 2021-05-25 MED ORDER — FERROUS SULFATE 325 (65 FE) MG PO TABS
325.0000 mg | ORAL_TABLET | ORAL | Status: DC
  Administered 2021-05-27: 325 mg via ORAL
  Filled 2021-05-25: qty 1

## 2021-05-25 MED ORDER — ONDANSETRON HCL 4 MG/2ML IJ SOLN
4.0000 mg | Freq: Once | INTRAMUSCULAR | Status: AC
  Administered 2021-05-25: 4 mg via INTRAVENOUS
  Filled 2021-05-25: qty 2

## 2021-05-25 MED ORDER — PREDNISONE 10 MG PO TABS
10.0000 mg | ORAL_TABLET | Freq: Every day | ORAL | Status: DC
  Administered 2021-05-26 – 2021-05-27 (×2): 10 mg via ORAL
  Filled 2021-05-25 (×2): qty 1

## 2021-05-25 MED ORDER — MORPHINE SULFATE (PF) 4 MG/ML IV SOLN
4.0000 mg | Freq: Once | INTRAVENOUS | Status: AC
  Administered 2021-05-25: 4 mg via INTRAVENOUS
  Filled 2021-05-25: qty 1

## 2021-05-25 MED ORDER — ADULT MULTIVITAMIN W/MINERALS CH
1.0000 | ORAL_TABLET | Freq: Every day | ORAL | Status: DC
  Administered 2021-05-26 – 2021-05-27 (×2): 1 via ORAL
  Filled 2021-05-25 (×2): qty 1

## 2021-05-25 MED ORDER — LABETALOL HCL 5 MG/ML IV SOLN
20.0000 mg | Freq: Once | INTRAVENOUS | Status: AC
  Administered 2021-05-25: 20 mg via INTRAVENOUS
  Filled 2021-05-25: qty 4

## 2021-05-25 MED ORDER — DORZOLAMIDE HCL 2 % OP SOLN
1.0000 [drp] | Freq: Two times a day (BID) | OPHTHALMIC | Status: DC
  Administered 2021-05-26 – 2021-05-27 (×3): 1 [drp] via OPHTHALMIC
  Filled 2021-05-25: qty 10

## 2021-05-25 MED ORDER — ASPIRIN EC 81 MG PO TBEC
81.0000 mg | DELAYED_RELEASE_TABLET | Freq: Every day | ORAL | Status: DC
  Administered 2021-05-26 – 2021-05-27 (×2): 81 mg via ORAL
  Filled 2021-05-25 (×2): qty 1

## 2021-05-25 MED ORDER — LATANOPROST 0.005 % OP SOLN
1.0000 [drp] | Freq: Every day | OPHTHALMIC | Status: DC
  Administered 2021-05-26 (×2): 1 [drp] via OPHTHALMIC
  Filled 2021-05-25: qty 2.5

## 2021-05-25 MED ORDER — ESCITALOPRAM OXALATE 10 MG PO TABS
20.0000 mg | ORAL_TABLET | Freq: Every day | ORAL | Status: DC
  Administered 2021-05-26 – 2021-05-27 (×2): 20 mg via ORAL
  Filled 2021-05-25 (×2): qty 2

## 2021-05-25 MED ORDER — KETOROLAC TROMETHAMINE 0.5 % OP SOLN
1.0000 [drp] | Freq: Four times a day (QID) | OPHTHALMIC | Status: DC
  Administered 2021-05-26 – 2021-05-27 (×5): 1 [drp] via OPHTHALMIC
  Filled 2021-05-25: qty 5

## 2021-05-25 MED ORDER — SODIUM CHLORIDE 1 G PO TABS
1.0000 g | ORAL_TABLET | Freq: Two times a day (BID) | ORAL | Status: DC
  Administered 2021-05-26 – 2021-05-27 (×3): 1 g via ORAL
  Filled 2021-05-25 (×5): qty 1

## 2021-05-25 MED ORDER — LISINOPRIL 5 MG PO TABS
2.5000 mg | ORAL_TABLET | Freq: Every day | ORAL | Status: DC
  Administered 2021-05-26 – 2021-05-27 (×2): 2.5 mg via ORAL
  Filled 2021-05-25 (×2): qty 1

## 2021-05-25 MED ORDER — ACETAMINOPHEN 325 MG PO TABS: 650.0000 mg | ORAL_TABLET | Freq: Four times a day (QID) | ORAL | Status: DC | PRN

## 2021-05-25 MED ORDER — FLUOROMETHOLONE 0.1 % OP SUSP
1.0000 [drp] | Freq: Two times a day (BID) | OPHTHALMIC | Status: DC
  Administered 2021-05-26 – 2021-05-27 (×4): 1 [drp] via OPHTHALMIC
  Filled 2021-05-25: qty 5

## 2021-05-25 MED ORDER — MORPHINE SULFATE (PF) 2 MG/ML IV SOLN
2.0000 mg | INTRAVENOUS | Status: DC | PRN
  Administered 2021-05-26: 2 mg via INTRAVENOUS
  Filled 2021-05-25: qty 1

## 2021-05-25 MED ORDER — ACETAMINOPHEN 650 MG RE SUPP: 650.0000 mg | Freq: Four times a day (QID) | RECTAL | Status: DC | PRN

## 2021-05-25 MED ORDER — TIMOLOL MALEATE 0.5 % OP SOLN
1.0000 [drp] | Freq: Two times a day (BID) | OPHTHALMIC | Status: DC
  Administered 2021-05-26 – 2021-05-27 (×4): 1 [drp] via OPHTHALMIC
  Filled 2021-05-25: qty 5

## 2021-05-25 MED ORDER — ONDANSETRON HCL 4 MG PO TABS: 4.0000 mg | ORAL_TABLET | Freq: Four times a day (QID) | ORAL | Status: DC | PRN

## 2021-05-25 MED ORDER — BRIMONIDINE TARTRATE-TIMOLOL 0.2-0.5 % OP SOLN
1.0000 [drp] | Freq: Two times a day (BID) | OPHTHALMIC | Status: DC
  Filled 2021-05-25: qty 5

## 2021-05-25 MED ORDER — ENOXAPARIN SODIUM 40 MG/0.4ML IJ SOSY
40.0000 mg | PREFILLED_SYRINGE | INTRAMUSCULAR | Status: DC
  Administered 2021-05-26 (×2): 40 mg via SUBCUTANEOUS
  Filled 2021-05-25 (×2): qty 0.4

## 2021-05-25 MED ORDER — FENTANYL CITRATE PF 50 MCG/ML IJ SOSY
25.0000 ug | PREFILLED_SYRINGE | Freq: Once | INTRAMUSCULAR | Status: AC
  Administered 2021-05-25: 25 ug via INTRAVENOUS
  Filled 2021-05-25: qty 1

## 2021-05-25 MED ORDER — NETARSUDIL DIMESYLATE 0.02 % OP SOLN: 1.0000 [drp] | Freq: Every day | OPHTHALMIC | Status: DC

## 2021-05-25 MED ORDER — ONDANSETRON HCL 4 MG/2ML IJ SOLN: 4.0000 mg | Freq: Four times a day (QID) | INTRAMUSCULAR | Status: DC | PRN

## 2021-05-25 NOTE — ED Provider Notes (Signed)
Sign out note  75 y/o lady with back pain.  Patient had fall couple weeks ago and initially seem to be improving.  At the time was having some upper back pain and chest pain.  Then she started having low back pain.  Initial CT imaging concerning for T1, T4 fracture, manubrial fracture.  Suspected to be related to her fall from a couple weeks ago.  MRI L-spine and SI joints ordered to further evaluate her severe low back pain.  3:44 PM Received signout from [place]  8:30 PM discussed case with neurosurgery, no intervention needed for the T-spine fractures, particularly given patient not having significant pain in that area at present.  Defers to Ortho regarding sacral ala fracture.  Discussed with Dr. Ninfa Linden, he recommends weightbearing as tolerated, nonoperative management of the sacral fractures.  Can follow-up outpatient in their clinic.  8:38 PM reassessed, still having some ongoing pain and has very severe pain with minimal movement; will consult medicine for admission for pain control, PT/OT consult and further observation.   Lucrezia Starch, MD 05/25/21 2039

## 2021-05-25 NOTE — Telephone Encounter (Signed)
FYI- I spoke with her daughter symptoms sound significantly worse compared to our visit 2 days ago, now crying out with pain and requiring carrying from room to room or to the car. Increasing steroids may help symptoms but could be masking injury or some existing spine disease contributing. I recommend ED evaluation for this for significant pain and now immobility. I recommend MRI of the back, ideally could be done at the hospital, but if this is not felt appropriate at least symptom control and I can order as urgent on outpatient basis.

## 2021-05-25 NOTE — H&P (Signed)
History and Physical    MARQITA BECERRIL [id number] DOB: April 10, 1946 DOA: 05/25/2021  PCP: Loman Brooklyn, FNP  Patient coming from: Home  I have personally briefly reviewed patient's old medical records in [place]  Chief Complaint: Back pain  HPI: Mckenzie [name] is a 75 y.o. 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  Pt with severe back pain since fall on 8/1.  Initially having upper back and CP after fall, now having severe low back pain to point where she is immobile.  Movement makes pain worse.  No fevers, chills, dysuria.   ED Course: CT and MRI scans: 1) fracture of manubrium 2) mild T1 compression fx 3) severe T4 compression fx 4) acute B sacral ALA fx.  Per EDP discussion with consultants: "discussed case with neurosurgery, no intervention needed for the T-spine fractures, particularly given patient not having significant pain in that area at present.  Defers to Ortho regarding sacral ala fracture.  Discussed with Dr. Ninfa Linden, he recommends weightbearing as tolerated, nonoperative management of the sacral fractures.  Can follow-up outpatient in their clinic."  Admitting for pain control.   Review of Systems: As per HPI, otherwise all review of systems negative.  Past Medical History:  Diagnosis Date   Depression    High cholesterol    Hypertension    Retinal micro-aneurysm of right eye     Past Surgical History:  Procedure Laterality Date   ARTERY BIOPSY Bilateral 12/15/2020   Procedure: BILATERAL TEMPORAL ARTERY BIOPSIES;  Surgeon: Angelia Mould, MD;  Location: [facility] OR;  Service: Vascular;  Laterality: Bilateral;   COLONOSCOPY WITH PROPOFOL N/A 12/09/2020   Procedure: COLONOSCOPY WITH PROPOFOL;  Surgeon: Gatha Mayer, MD;  Location: [facility] ENDOSCOPY;  Service: Endoscopy;  Laterality: N/A;   ESOPHAGOGASTRODUODENOSCOPY (EGD) WITH PROPOFOL N/A 12/09/2020   Procedure: ESOPHAGOGASTRODUODENOSCOPY (EGD) WITH  PROPOFOL;  Surgeon: Gatha Mayer, MD;  Location: [facility];  Service: Endoscopy;  Laterality: N/A;   EYE SURGERY     FINGER SURGERY Left    RING FINGER   IR THORACENTESIS ASP PLEURAL SPACE W/IMG GUIDE  12/13/2020   OPEN REDUCTION INTERNAL FIXATION (ORIF) DISTAL RADIAL FRACTURE Left 10/05/2020   Procedure: OPEN REDUCTION INTERNAL FIXATION (ORIF) DISTAL RADIAL FRACTURE;  Surgeon: Shona Needles, MD;  Location: [facility];  Service: Orthopedics;  Laterality: Left;  supraclavicular block   POLYPECTOMY  12/09/2020   Procedure: POLYPECTOMY;  Surgeon: Gatha Mayer, MD;  Location: [facility] ENDOSCOPY;  Service: Endoscopy;;   TUBAL LIGATION       reports that she has quit smoking. Her smoking use included cigarettes. She has never used smokeless tobacco. She reports that she does not drink alcohol and does not use drugs.  Allergies  Allergen Reactions   Aldomet [Methyldopa] Other (See Comments)    flu   Fosamax [Alendronate Sodium]    Acetazolamide Rash   Buspar [Buspirone] Palpitations    Family History  Problem Relation Age of Onset   Hyperlipidemia Mother    Hypertension Mother    Diabetes Mother    COPD Father    Depression Sister    Suicidality Brother    Drug abuse Brother    Healthy Daughter    Healthy Daughter    Healthy Daughter    Colon cancer Neg Hx    Celiac disease Neg Hx    Inflammatory bowel disease Neg Hx      Prior to Admission medications   Medication Sig Start  Date End Date Taking? Authorizing Provider  aspirin 81 MG EC tablet TAKE 1 TABLET (81 MG TOTAL) BY MOUTH DAILY. SWALLOW WHOLE. 12/19/20 12/19/21 Yes Dessa Phi, DO  COMBIGAN 0.2-0.5 % ophthalmic solution Place 1 drop into both eyes every 12 (twelve) hours. 12/01/13  Yes [provider]  dorzolamide (TRUSOPT) 2 % ophthalmic solution Place 1 drop into both eyes 2 (two) times daily.   Yes [provider]  Ergocalciferol (VITAMIN D2) 50 MCG (2000 UT) TABS Take 1 tablet by mouth daily. 04/30/21   Yes Sater, Nanine Means, MD  escitalopram (LEXAPRO) 20 MG tablet Take 1 tablet (20 mg total) by mouth daily. 01/18/21  Yes Hendricks Limes F, FNP  ferrous sulfate 325 (65 FE) MG tablet Take 325 mg by mouth every other day.   Yes [provider]  fluorometholone (FML) 0.1 % ophthalmic suspension Place 1 drop into the right eye 2 (two) times daily.   Yes [provider]  latanoprost (XALATAN) 0.005 % ophthalmic solution Place 1 drop into both eyes at bedtime. 12/29/15  Yes [provider]  lisinopril (ZESTRIL) 2.5 MG tablet Take 2.5 mg by mouth daily. 05/16/21 05/16/22 Yes [provider]  MAGNESIUM PO Take by mouth.   Yes [provider]  Multiple Vitamin (MULTIVITAMIN) tablet Take 1 tablet by mouth daily.   Yes [provider]  Netarsudil Dimesylate (RHOPRESSA) 0.02 % SOLN Place 1 drop into both eyes at bedtime.   Yes [provider]  predniSONE (DELTASONE) 5 MG tablet Take 3 tablets (15 mg total) by mouth daily with breakfast for 7 days, THEN 2.5 tablets (12.5 mg total) daily with breakfast for 7 days, THEN 2 tablets (10 mg total) daily with breakfast for 7 days, THEN 1.5 tablets (7.5 mg total) daily with breakfast for 7 days, THEN 1 tablet (5 mg total) daily with breakfast for 7 days. Patient taking differently: Take 2 tablets (10 mg) daily 05/01/21 06/05/21 Yes Rice, Resa Miner, MD  PROLENSA 0.07 % SOLN Place 1 drop into the right eye daily. 12/14/13  Yes [provider]  sodium chloride 1 g tablet Take 1 g by mouth 2 (two) times daily with a meal.   Yes [provider]  Accu-Chek Softclix Lancets lancets USE UP TO FOUR TIMES DAILY AS DIRECTED Patient not taking: No sig reported 12/19/20 12/19/21  Dessa Phi, DO  glucose blood test strip USE UP TO FOUR TIMES DAILY AS DIRECTED Patient not taking: No sig reported 12/19/20 12/19/21  Dessa Phi, DO  Insulin Pen Needle 32G X 4 MM MISC USE WITH INSULIN, THREE TIMES  DAILY Patient not taking: No sig reported 12/19/20 12/19/21  Dessa Phi, DO  Tocilizumab (ACTEMRA IV) Inject into the vein. '6mg'$ /kg every 28 days 03/12/21   Collier Salina, MD    Physical Exam: Vitals:   05/25/21 1630 05/25/21 1645 05/25/21 1830 05/25/21 1831  BP: (!) 187/76 (!) 180/73 (!) 151/64 (!) 151/64  Pulse: 72 70 67 67  Resp: '16 16  16  '$ Temp:    98.5 F (36.9 C)  TempSrc:    Oral  SpO2: 93% 94% 91% 91%    Constitutional: NAD, calm, comfortable Eyes: PERRL, lids and conjunctivae normal ENMT: Mucous membranes are moist. Posterior pharynx clear of any exudate or lesions.Normal dentition.  Neck: normal, supple, no masses, no thyromegaly Respiratory: clear to auscultation bilaterally, no wheezing, no crackles. Normal respiratory effort. No accessory muscle use.  Cardiovascular: Regular rate and rhythm, no murmurs / rubs / gallops.  No extremity edema. 2+ pedal pulses. No carotid bruits.  Abdomen: no tenderness, no masses palpated. No hepatosplenomegaly. Bowel sounds positive.  Musculoskeletal: Upper sternal TTP, lower lumbar and sacral tenderness. Skin: no rashes, lesions, ulcers. No induration Neurologic: CN 2-12 grossly intact. Sensation intact, DTR normal. Strength 5/5 in all 4.  Psychiatric: Normal judgment and insight. Alert and oriented x 3. Normal mood.    Labs on Admission: I have personally reviewed following labs and imaging studies  CBC: Recent Labs  Lab 05/25/21 1336  WBC 11.1*  NEUTROABS 9.6*  HGB 13.7  HCT 42.9  MCV 94.3  PLT 99991111   Basic Metabolic Panel: Recent Labs  Lab 05/25/21 1336  NA 138  K 3.8  CL 101  CO2 25  GLUCOSE 129*  BUN 11  CREATININE 0.42*  CALCIUM 9.6   GFR: Estimated Creatinine Clearance: 53.8 mL/min (A) (by C-G formula based on SCr of 0.42 mg/dL (L)). Liver Function Tests: Recent Labs  Lab 05/25/21 1336  AST 22  ALT 23  ALKPHOS 208*  BILITOT 0.9  PROT 6.4*  ALBUMIN 3.7   No results for input(s): LIPASE, AMYLASE  in the last 168 hours. No results for input(s): AMMONIA in the last 168 hours. Coagulation Profile: No results for input(s): INR, PROTIME in the last 168 hours. Cardiac Enzymes: No results for input(s): CKTOTAL, CKMB, CKMBINDEX, TROPONINI in the last 168 hours. BNP (last 3 results) No results for input(s): PROBNP in the last 8760 hours. HbA1C: No results for input(s): HGBA1C in the last 72 hours. CBG: No results for input(s): GLUCAP in the last 168 hours. Lipid Profile: No results for input(s): CHOL, HDL, LDLCALC, TRIG, CHOLHDL, LDLDIRECT in the last 72 hours. Thyroid Function Tests: No results for input(s): TSH, T4TOTAL, FREET4, T3FREE, THYROIDAB in the last 72 hours. Anemia Panel: No results for input(s): VITAMINB12, FOLATE, FERRITIN, TIBC, IRON, RETICCTPCT in the last 72 hours. Urine analysis:    Component Value Date/Time   COLORURINE YELLOW 12/08/2020 0129   APPEARANCEUR HAZY (A) 12/08/2020 0129   APPEARANCEUR Clear 11/21/2020 1644   LABSPEC 1.019 12/08/2020 0129   PHURINE 6.0 12/08/2020 0129   GLUCOSEU NEGATIVE 12/08/2020 0129   HGBUR NEGATIVE 12/08/2020 0129   BILIRUBINUR NEGATIVE 12/08/2020 0129   BILIRUBINUR Negative 11/21/2020 1644   KETONESUR NEGATIVE 12/08/2020 0129   PROTEINUR NEGATIVE 12/08/2020 0129   UROBILINOGEN negative 05/13/2015 0827   NITRITE NEGATIVE 12/08/2020 0129   LEUKOCYTESUR NEGATIVE 12/08/2020 0129    Radiological Exams on Admission: CT Thoracic Spine Wo Contrast  Result Date: 05/25/2021 CLINICAL DATA:  Mid and low back pain, trauma; fall last night EXAM: CT THORACIC AND LUMBAR SPINE WITHOUT CONTRAST TECHNIQUE: Multidetector CT imaging of the thoracic and lumbar spine was performed without contrast. Multiplanar CT image reconstructions were also generated. COMPARISON:  Correlation made with CT chest March 2022 and CT abdomen February 2022 FINDINGS: CT THORACIC SPINE FINDINGS Alignment: No significant anteroposterior listhesis. Mild osseous  retropulsion at T4. Vertebrae: New mild loss of height at the superior endplate of T1. New marked loss of height of T4. Decreased osseous mineralization. Paraspinal and other soft tissues: Persistent 5 mm ground-glass nodule of the posterior right upper lobe (series 6, image 34). Interval fracture deformity of the manubrium. Disc levels: Osseous retropulsion at T4 results in minor canal stenosis. Facet spurring encroaches on the T4-T5 neural foramina. CT LUMBAR SPINE FINDINGS Segmentation: 5 lumbar type vertebrae. Alignment: Grade 1 anterolisthesis at L4-L5. Mild retrolisthesis at L1-L2 and L2-L3. Vertebrae: Vertebral body heights are maintained.  No acute fracture. Decreased osseous mineralization. Paraspinal and other soft tissues: Aortic atherosclerosis. Disc levels: Significant hypertrophic facet arthropathy throughout. Multilevel disc bulges. Ligamentum flavum thickening. Canal stenosis is greatest at L3-L4. No high-grade foraminal narrowing. IMPRESSION: Since chest CT of March 2022, there are new fractures including mild T1 compression fracture, marked T4 compression fracture, and fracture of the manubrium. These are not definitely acute; please correlate for history of interval significant trauma. Endplate retropulsion at T4 results in minor canal stenosis. No lumbar spine fracture.  Multilevel lumbar spondylosis. Persistent 6 mm right upper lobe ground-glass nodule. Repeat CT is recommended every 2 years until 5 years of stability has been established. This recommendation follows the consensus statement: Guidelines for Management of Incidental Pulmonary Nodules Detected on CT Images: From the Fleischner Society 2017; Radiology 2017; 284:228-243. Electronically Signed   By: Macy Mis M.D.   On: 05/25/2021 14:46   CT Lumbar Spine Wo Contrast  Result Date: 05/25/2021 CLINICAL DATA:  Mid and low back pain, trauma; fall last night EXAM: CT THORACIC AND LUMBAR SPINE WITHOUT CONTRAST TECHNIQUE:  Multidetector CT imaging of the thoracic and lumbar spine was performed without contrast. Multiplanar CT image reconstructions were also generated. COMPARISON:  Correlation made with CT chest March 2022 and CT abdomen February 2022 FINDINGS: CT THORACIC SPINE FINDINGS Alignment: No significant anteroposterior listhesis. Mild osseous retropulsion at T4. Vertebrae: New mild loss of height at the superior endplate of T1. New marked loss of height of T4. Decreased osseous mineralization. Paraspinal and other soft tissues: Persistent 5 mm ground-glass nodule of the posterior right upper lobe (series 6, image 34). Interval fracture deformity of the manubrium. Disc levels: Osseous retropulsion at T4 results in minor canal stenosis. Facet spurring encroaches on the T4-T5 neural foramina. CT LUMBAR SPINE FINDINGS Segmentation: 5 lumbar type vertebrae. Alignment: Grade 1 anterolisthesis at L4-L5. Mild retrolisthesis at L1-L2 and L2-L3. Vertebrae: Vertebral body heights are maintained. No acute fracture. Decreased osseous mineralization. Paraspinal and other soft tissues: Aortic atherosclerosis. Disc levels: Significant hypertrophic facet arthropathy throughout. Multilevel disc bulges. Ligamentum flavum thickening. Canal stenosis is greatest at L3-L4. No high-grade foraminal narrowing. IMPRESSION: Since chest CT of March 2022, there are new fractures including mild T1 compression fracture, marked T4 compression fracture, and fracture of the manubrium. These are not definitely acute; please correlate for history of interval significant trauma. Endplate retropulsion at T4 results in minor canal stenosis. No lumbar spine fracture.  Multilevel lumbar spondylosis. Persistent 6 mm right upper lobe ground-glass nodule. Repeat CT is recommended every 2 years until 5 years of stability has been established. This recommendation follows the consensus statement: Guidelines for Management of Incidental Pulmonary Nodules Detected on CT  Images: From the Fleischner Society 2017; Radiology 2017; 284:228-243. Electronically Signed   By: Macy Mis M.D.   On: 05/25/2021 14:46   MR LUMBAR SPINE WO CONTRAST  Result Date: 05/25/2021 CLINICAL DATA:  Lumbar radiculopathy EXAM: MRI LUMBAR SPINE WITHOUT CONTRAST TECHNIQUE: Multiplanar, multisequence MR imaging of the lumbar spine was performed. No intravenous contrast was administered. COMPARISON:  None. FINDINGS: Segmentation:  Standard. Alignment: Grade 1 retrolisthesis at L1-2. Grade 1 anterolisthesis at L4-5. Vertebrae:  No fracture, evidence of discitis, or bone lesion. Conus medullaris and cauda equina: Conus extends to the L1 level. Conus and cauda equina appear normal. Paraspinal and other soft tissues: Negative. Disc levels: T12-L1: Normal disc space and facets. No spinal canal or neuroforaminal stenosis. L1-L2: Normal disc space and facets. No spinal canal or neuroforaminal stenosis. L2-L3: Mild  facet hypertrophy. No disc herniation. No spinal canal or neural stenosis. L3-L4: Moderate facet hypertrophy with intermediate sized disc bulge. Ligamentum flavum redundancy. No central spinal canal or neural foraminal stenosis. L4-L5: Severe facet hypertrophy with grade 1 anterolisthesis and disc uncovering. Mild narrowing of both lateral recesses and both neural foramina. L5-S1: Small disc bulge with mild facet hypertrophy. No spinal canal or neural foraminal stenosis. Visualized sacrum: Normal. IMPRESSION: 1. Severe facet arthrosis at L4-L5 with grade 1 anterolisthesis and mild narrowing of both lateral recesses and both neural foramina. 2. Moderate L3-4 facet arthrosis without associated stenosis. Electronically Signed   By: Ulyses Jarred M.D.   On: 05/25/2021 19:12   MR SACRUM SI JOINTS WO CONTRAST  Result Date: 05/25/2021 CLINICAL DATA:  Lumbar radiculopathy, prior surgery, new symptoms EXAM: MRI SACRUM WITHOUT CONTRAST TECHNIQUE: Multiplanar multi-sequence MR imaging of the sacrum was  performed. No intravenous contrast was administered. COMPARISON:  Radiograph 05/23/2021, CT 05/25/2021 FINDINGS: Urinary Tract:  No abnormality visualized. Bowel: Sigmoid diverticuli without evidence of diverticulitis. Otherwise unremarkable. Vascular/Lymphatic: No pathologically enlarged lymph nodes. No significant vascular abnormality seen. Reproductive:  Unremarkable. Other:  None Musculoskeletal: There is bony edema at the level of S2-S3 with linear low intensity fracture lines extending vertically along the sacral ala bilaterally. These are nondisplaced and may represent insufficiency fractures. There is reactive muscle edema within the pelvic musculature. Bilateral hip osteoarthritis, partially visualized. IMPRESSION: Nondisplaced, acute bilateral sacral ala fractures, with extensive bony edema at the level of S2-S3. These may represent insufficiency fractures. Electronically Signed   By: Maurine Simmering M.D.   On: 05/25/2021 19:00    EKG: Independently reviewed.  Assessment/Plan Principal Problem:   Fracture of sacrum (HCC) Active Problems:   Essential hypertension   Compression fracture of T1 vertebra (HCC)   Compression fracture of T4 vertebra (HCC)   Fracture of manubrium    Fracture of B sacral ALA - WBAT per Dr. Ninfa Linden, follow up in office Morphine PRN pain control PT/OT Compression fractures of T1, T4 - Non-surgical per NS Seems most of her pain is lower back / sacrum at this time. Pain control as above GCA - Continue prednisone, currently on '10mg'$  daily dose  DVT prophylaxis: Lovenox Code Status: Full Family Communication: Family at bedside Disposition Plan: TBD Consults called: NS and Ortho called by EDP as above Admission status: Place in obs    Rachele Lamaster, [place] [facility]  How to contact the [facility] Attending or Consulting provider [place] or covering provider during after hours [place], for this patient?  Check the care team in [facility] and look for a)  attending/consulting [facility] provider listed and b) the [facility] team listed Log into [email or web address]  Amion Physician Scheduling and messaging for groups and whole hospitals  On call and physician scheduling software for group practices, residents, hospitalists and other medical providers for call, clinic, rotation and shift schedules. On[employer] is a hospital-wide system for scheduling doctors and paging doctors on call. EasyPlot is for scientific plotting and data analysis.  [email or web address]  and use [place]'s universal password to access. If you do not have the password, please contact the hospital operator.  Locate the [facility] provider you are looking for under [facility] and page to a number that you can be directly reached. If you still have difficulty reaching the provider, please page the [facility] (Director on Call) for the Hospitalists listed on amion for assistance.  05/25/2021, 10:23 PM

## 2021-05-25 NOTE — ED Notes (Signed)
Patient transported to MRI 

## 2021-05-25 NOTE — Telephone Encounter (Signed)
Patient's daughter, [name], called the office checking on the return call. She has some concerns over increased pain patient has been experiencing overnight. Patient's daughter asked if she should take her to the ED and advised due to increased pain that might be the best idea.   Patient's daughter has continued concerns her pain is related to decrease in Prednisone dosage.   Please contact patient's daughter to discuss these concerns and how to proceed. [name] can be reached at [phone].

## 2021-05-25 NOTE — ED Notes (Signed)
BP 223/97, Dr Ashok Cordia notified.

## 2021-05-25 NOTE — ED Provider Notes (Signed)
[facility] EMERGENCY DEPARTMENT Provider Note   CSN: [id number] Arrival date & time: 05/25/21  1303     History Chief Complaint  Patient presents with   Back Pain    Mckenzie [name] is a 75 y.o. female.  Patient c/o low back pain in past week. Symptoms acute onset, severe, constant, dull, non radiating, worse w movement. States feels weak and unable to walk. States had a fall around 8/1, no back injury or fall since then. States mechanical fall then - no loc, no faintness or dizziness. Denies head injury or loc. Post fall, c/o upper sternal area pain and upper/mid back pain, but states overall was doing ok, pain manageable, able to ambulate normally. Denies hx chronic back pain.  No urinary retention, or incontinence. No trouble controlling bms. PCP had done plain films but given uncontrolled pain and inability to walk sent to ED for possible MRI.   The history is provided by the patient, the spouse and medical records.  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 11/14/2020   Cognitive change 11/14/2020   Adjustment disorder with mixed anxiety and depressed mood 10/10/2020   Fracture 10/06/2020   Distal radius fracture [id number]   Acute metabolic encephalopathy [id number]   Proximal weakness of extremity 09/26/2020   Myalgia 09/26/2020   Fall at home, subsequent encounter 09/26/2020   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   Essential hypertension 01/11/2014   Vertigo 01/11/2014   Hyperlipidemia 01/11/2014    Past Surgical History:  Procedure Laterality Date   ARTERY BIOPSY Bilateral 12/15/2020   Procedure: BILATERAL TEMPORAL ARTERY BIOPSIES;  Surgeon: Angelia Mould, MD;  Location: [facility] OR;  Service: Vascular;  Laterality: Bilateral;   COLONOSCOPY WITH PROPOFOL N/A 12/09/2020   Procedure: COLONOSCOPY WITH PROPOFOL;  Surgeon: Gatha Mayer, MD;  Location: [facility] ENDOSCOPY;  Service: Endoscopy;  Laterality: N/A;   ESOPHAGOGASTRODUODENOSCOPY (EGD) WITH PROPOFOL N/A 12/09/2020   Procedure: ESOPHAGOGASTRODUODENOSCOPY (EGD) WITH PROPOFOL;  Surgeon: Gatha Mayer, MD;  Location: [place];  Service: Endoscopy;  Laterality: N/A;   EYE SURGERY     FINGER SURGERY Left    RING FINGER   IR THORACENTESIS ASP PLEURAL SPACE W/IMG GUIDE  12/13/2020   OPEN REDUCTION INTERNAL FIXATION (ORIF) DISTAL RADIAL FRACTURE Left 10/05/2020   Procedure: OPEN REDUCTION INTERNAL FIXATION (ORIF) DISTAL RADIAL FRACTURE;  Surgeon: Shona Needles, MD;  Location: [place];  Service: Orthopedics;  Laterality: Left;  supraclavicular block   POLYPECTOMY  12/09/2020  Procedure: POLYPECTOMY;  Surgeon: Gatha Mayer, MD;  Location: [facility] ENDOSCOPY;  Service: Endoscopy;;   TUBAL LIGATION       OB History   No obstetric history on file.     Family History  Problem Relation Age of Onset   Hyperlipidemia Mother    Hypertension Mother    Diabetes Mother    COPD Father    Depression Sister    Suicidality Brother    Drug abuse Brother     Healthy Daughter    Healthy Daughter    Healthy Daughter    Colon cancer Neg Hx    Celiac disease Neg Hx    Inflammatory bowel disease Neg Hx     Social History   Tobacco Use   Smoking status: Former    Types: Cigarettes   Smokeless tobacco: Never   Tobacco comments:    for a short period  Media planner   Vaping Use: Never used  Substance Use Topics   Alcohol use: No   Drug use: No    Home Medications Prior to Admission medications   Medication Sig Start Date End Date Taking? Authorizing Provider  Accu-Chek Softclix Lancets lancets USE UP TO FOUR TIMES DAILY AS DIRECTED Patient not taking: No sig reported 12/19/20 12/19/21  Dessa Phi, DO  amLODipine (NORVASC) 2.5 MG tablet Take 1 tablet (2.5 mg total) by mouth daily. Patient not taking: No sig reported 12/27/20   Loman Brooklyn, FNP  aspirin 81 MG EC tablet TAKE 1 TABLET (81 MG TOTAL) BY MOUTH DAILY. SWALLOW WHOLE. 12/19/20 12/19/21  Dessa Phi, DO  colchicine 0.6 MG tablet Take 1 tablet (0.6 mg total) by mouth daily. Patient not taking: No sig reported 12/20/20   Dessa Phi, DO  COMBIGAN 0.2-0.5 % ophthalmic solution Place 1 drop into both eyes every 12 (twelve) hours. 12/01/13   [provider]  dorzolamide (TRUSOPT) 2 % ophthalmic solution Place 1 drop into both eyes 2 (two) times daily.    [provider]  Ergocalciferol (VITAMIN D2) 50 MCG (2000 UT) TABS Take 1 tablet by mouth daily. 04/30/21   Sater, Nanine Means, MD  escitalopram (LEXAPRO) 20 MG tablet Take 1 tablet (20 mg total) by mouth daily. 01/18/21   Loman Brooklyn, FNP  ferrous sulfate 325 (65 FE) MG tablet Take 325 mg by mouth every other day.    [provider]  fluorometholone (FML) 0.1 % ophthalmic suspension Place 1 drop into the right eye 2 (two) times daily.    [provider]  furosemide (LASIX) 20 MG tablet Take 20 mg by mouth 2 (two) times daily. 05/11/21   [provider]  glucose blood test strip USE UP TO  FOUR TIMES DAILY AS DIRECTED Patient not taking: No sig reported 12/19/20 12/19/21  Dessa Phi, DO  hydroxypropyl methylcellulose / hypromellose (ISOPTO TEARS / GONIOVISC) 2.5 % ophthalmic solution Place 1 drop into both eyes 3 (three) times daily as needed for dry eyes.    [provider]  insulin aspart (NOVOLOG) 100 UNIT/ML FlexPen CHECK BLOOD SUGAR WITH MEALS, THREE TIMES DAILY. THEN FOLLOW SLIDING SCALE AS PROVIDED ON AFTER VISIT SUMMARY Patient not taking: No sig reported 12/19/20 12/19/21  Dessa Phi, DO  Insulin Pen Needle 31G X 5 MM MISC Use with insulin, three times daily Patient not taking: No sig reported 12/19/20   Dessa Phi, DO  Insulin Pen Needle 32G X 4 MM MISC USE WITH INSULIN, THREE TIMES DAILY Patient not taking: No sig  reported 12/19/20 12/19/21  Dessa Phi, DO  latanoprost (XALATAN) 0.005 % ophthalmic solution Place 1 drop into both eyes at bedtime. 12/29/15   [provider]  lisinopril (ZESTRIL) 2.5 MG tablet Take 2.5 mg by mouth daily. 05/16/21 05/16/22  [provider]  lubiprostone (AMITIZA) 8 MCG capsule Take one cap with food once or twice daily to maintain regular bowel movements. Patient not taking: No sig reported 12/06/20   Mahala Menghini, PA-C  MAGNESIUM PO Take by mouth.    [provider]  Multiple Vitamin (MULTIVITAMIN) tablet Take 1 tablet by mouth daily.    [provider]  Netarsudil Dimesylate (RHOPRESSA) 0.02 % SOLN Place 1 drop into both eyes at bedtime.    [provider]  pantoprazole (PROTONIX) 40 MG tablet TAKE 1 TABLET (40 MG TOTAL) BY MOUTH DAILY. Patient not taking: Reported on 05/23/2021 12/19/20 12/19/21  Dessa Phi, DO  prednisoLONE acetate (PRED FORTE) 1 % ophthalmic suspension Place 1 drop into the left eye daily.    [provider]  predniSONE (DELTASONE) 5 MG tablet Take 3 tablets (15 mg total) by mouth daily with breakfast for 7 days, THEN 2.5 tablets (12.5 mg total) daily  with breakfast for 7 days, THEN 2 tablets (10 mg total) daily with breakfast for 7 days, THEN 1.5 tablets (7.5 mg total) daily with breakfast for 7 days, THEN 1 tablet (5 mg total) daily with breakfast for 7 days. 05/01/21 06/05/21  Collier Salina, MD  PROLENSA 0.07 % SOLN Place 1 drop into the right eye daily. 12/14/13   [provider]  Tocilizumab (ACTEMRA IV) Inject into the vein. '6mg'$ /kg every 28 days 03/12/21   Collier Salina, MD    Allergies    Aldomet [methyldopa], Fosamax [alendronate sodium], Acetazolamide, and Buspar [buspirone]  Review of Systems   Review of Systems  Constitutional:  Negative for chills and fever.  HENT:  Negative for sore throat.   Eyes:  Negative for redness.  Respiratory:  Negative for shortness of breath.   Cardiovascular:  Negative for chest pain.  Gastrointestinal:  Negative for abdominal pain.  Genitourinary:  Negative for flank pain.  Musculoskeletal:  Positive for back pain. Negative for neck pain.  Skin:  Negative for rash.  Neurological:  Negative for numbness and headaches.  Hematological:  Does not bruise/bleed easily.  Psychiatric/Behavioral:  Negative for confusion.    Physical Exam Updated Vital Signs BP (!) 223/100 (BP Location: Right Arm)   Pulse 86   Temp 98.9 F (37.2 C) (Oral)   Resp 14   SpO2 99%   Physical Exam Vitals and nursing note reviewed.  Constitutional:      Appearance: Normal appearance. She is well-developed.  HENT:     Head: Atraumatic.     Nose: Nose normal.     Mouth/Throat:     Mouth: Mucous membranes are moist.  Eyes:     General: No scleral icterus.    Conjunctiva/sclera: Conjunctivae normal.     Pupils: Pupils are equal, round, and reactive to light.  Neck:     Trachea: No tracheal deviation.  Cardiovascular:     Rate and Rhythm: Normal rate and regular rhythm.     Pulses: Normal pulses.     Heart sounds: Normal heart sounds. No murmur heard.   No friction rub. No gallop.  Pulmonary:      Effort: Pulmonary effort is normal. No respiratory distress.     Breath sounds: Normal breath sounds.  Comments: Upper sternal tenderness, no crepitus.  Abdominal:     General: Bowel sounds are normal. There is no distension.     Palpations: Abdomen is soft.     Tenderness: There is no abdominal tenderness. There is no guarding.  Genitourinary:    Comments: No cva tenderness.  Musculoskeletal:        General: No swelling.     Cervical back: Normal range of motion and neck supple. No rigidity. No muscular tenderness.     Comments: Lower lumbar/sacral tenderness. No sts or skin changes/erythema to area. Mild upper thoracic tenderness. Otherwise CTLS spine, non tender, aligned, no step off. No focal pain or bony tenderness on extremity exam.   Skin:    General: Skin is warm and dry.     Findings: No rash.  Neurological:     Mental Status: She is alert.     Comments: Alert, speech normal. Motor intact bilateral lower extremities, stre 5/5. Sens grossly intact bil.   Psychiatric:        Mood and Affect: Mood normal.    ED Results / Procedures / Treatments   Labs (all labs ordered are listed, but only abnormal results are displayed) Results for orders placed or performed during the hospital encounter of 05/25/21  CBC with Differential/Platelet  Result Value Ref Range   WBC 11.1 (H) 4.0 - 10.5 K/uL   RBC 4.55 3.87 - 5.11 MIL/uL   Hemoglobin 13.7 12.0 - 15.0 g/dL   HCT 42.9 36.0 - 46.0 %   MCV 94.3 80.0 - 100.0 fL   MCH 30.1 26.0 - 34.0 pg   MCHC 31.9 30.0 - 36.0 g/dL   RDW 14.5 11.5 - 15.5 %   Platelets 310 150 - 400 K/uL   nRBC 0.0 0.0 - 0.2 %   Neutrophils Relative % 87 %   Neutro Abs 9.6 (H) 1.7 - 7.7 K/uL   Lymphocytes Relative 9 %   Lymphs Abs 1.0 0.7 - 4.0 K/uL   Monocytes Relative 4 %   Monocytes Absolute 0.4 0.1 - 1.0 K/uL   Eosinophils Relative 0 %   Eosinophils Absolute 0.0 0.0 - 0.5 K/uL   Basophils Relative 0 %   Basophils Absolute 0.0 0.0 - 0.1 K/uL    Immature Granulocytes 0 %   Abs Immature Granulocytes 0.05 0.00 - 0.07 K/uL  Comprehensive metabolic panel  Result Value Ref Range   Sodium 138 135 - 145 mmol/L   Potassium 3.8 3.5 - 5.1 mmol/L   Chloride 101 98 - 111 mmol/L   CO2 25 22 - 32 mmol/L   Glucose, Bld 129 (H) 70 - 99 mg/dL   BUN 11 8 - 23 mg/dL   Creatinine, Ser 0.42 (L) 0.44 - 1.00 mg/dL   Calcium 9.6 8.9 - 10.3 mg/dL   Total Protein 6.4 (L) 6.5 - 8.1 g/dL   Albumin 3.7 3.5 - 5.0 g/dL   AST 22 15 - 41 U/L   ALT 23 0 - 44 U/L   Alkaline Phosphatase 208 (H) 38 - 126 U/L   Total Bilirubin 0.9 0.3 - 1.2 mg/dL   GFR, Estimated >60 >60 mL/min   Anion gap 12 5 - 15   DG Chest 2 View  Result Date: 05/24/2021 CLINICAL DATA:  Chest pain. EXAM: CHEST - 2 VIEW COMPARISON:  Chest radiograph dated 09/05/2021. FINDINGS: No focal consolidation, pleural effusion, or pneumothorax. Mild cardiomegaly. Degenerative changes of the spine. Age indeterminate compression fracture of an upper thoracic vertebra with anterior wedging.  This is however new since the prior radiograph. Correlation with point tenderness recommended. IMPRESSION: 1. No acute cardiopulmonary process. 2. Mild cardiomegaly. Electronically Signed   By: Anner Crete M.D.   On: 05/24/2021 01:29   DG Sacrum/Coccyx  Result Date: 05/24/2021 CLINICAL DATA:  Low back and sacral pain, worse x1 week. Recent fall at home. EXAM: SACRUM AND COCCYX - 2+ VIEW COMPARISON:  CT abdomen pelvis 11/30/2020 FINDINGS: There is no evidence of fracture or other focal bone lesions. Stable grade 1 anterolisthesis of L4 on L5. Advanced facet arthropathy at the lower lumbar levels. Mild osteoarthritis of the right hip joint and pubic symphysis. IMPRESSION: No evidence for fracture. Electronically Signed   By: Ileana Roup M.D.   On: 05/24/2021 14:48   CT Thoracic Spine Wo Contrast  Result Date: 05/25/2021 CLINICAL DATA:  Mid and low back pain, trauma; fall last night EXAM: CT THORACIC AND LUMBAR SPINE  WITHOUT CONTRAST TECHNIQUE: Multidetector CT imaging of the thoracic and lumbar spine was performed without contrast. Multiplanar CT image reconstructions were also generated. COMPARISON:  Correlation made with CT chest March 2022 and CT abdomen February 2022 FINDINGS: CT THORACIC SPINE FINDINGS Alignment: No significant anteroposterior listhesis. Mild osseous retropulsion at T4. Vertebrae: New mild loss of height at the superior endplate of T1. New marked loss of height of T4. Decreased osseous mineralization. Paraspinal and other soft tissues: Persistent 5 mm ground-glass nodule of the posterior right upper lobe (series 6, image 34). Interval fracture deformity of the manubrium. Disc levels: Osseous retropulsion at T4 results in minor canal stenosis. Facet spurring encroaches on the T4-T5 neural foramina. CT LUMBAR SPINE FINDINGS Segmentation: 5 lumbar type vertebrae. Alignment: Grade 1 anterolisthesis at L4-L5. Mild retrolisthesis at L1-L2 and L2-L3. Vertebrae: Vertebral body heights are maintained. No acute fracture. Decreased osseous mineralization. Paraspinal and other soft tissues: Aortic atherosclerosis. Disc levels: Significant hypertrophic facet arthropathy throughout. Multilevel disc bulges. Ligamentum flavum thickening. Canal stenosis is greatest at L3-L4. No high-grade foraminal narrowing. IMPRESSION: Since chest CT of March 2022, there are new fractures including mild T1 compression fracture, marked T4 compression fracture, and fracture of the manubrium. These are not definitely acute; please correlate for history of interval significant trauma. Endplate retropulsion at T4 results in minor canal stenosis. No lumbar spine fracture.  Multilevel lumbar spondylosis. Persistent 6 mm right upper lobe ground-glass nodule. Repeat CT is recommended every 2 years until 5 years of stability has been established. This recommendation follows the consensus statement: Guidelines for Management of Incidental Pulmonary  Nodules Detected on CT Images: From the Fleischner Society 2017; Radiology 2017; 284:228-243. Electronically Signed   By: Macy Mis M.D.   On: 05/25/2021 14:46   CT Lumbar Spine Wo Contrast  Result Date: 05/25/2021 CLINICAL DATA:  Mid and low back pain, trauma; fall last night EXAM: CT THORACIC AND LUMBAR SPINE WITHOUT CONTRAST TECHNIQUE: Multidetector CT imaging of the thoracic and lumbar spine was performed without contrast. Multiplanar CT image reconstructions were also generated. COMPARISON:  Correlation made with CT chest March 2022 and CT abdomen February 2022 FINDINGS: CT THORACIC SPINE FINDINGS Alignment: No significant anteroposterior listhesis. Mild osseous retropulsion at T4. Vertebrae: New mild loss of height at the superior endplate of T1. New marked loss of height of T4. Decreased osseous mineralization. Paraspinal and other soft tissues: Persistent 5 mm ground-glass nodule of the posterior right upper lobe (series 6, image 34). Interval fracture deformity of the manubrium. Disc levels: Osseous retropulsion at T4 results in minor canal stenosis. Facet  spurring encroaches on the T4-T5 neural foramina. CT LUMBAR SPINE FINDINGS Segmentation: 5 lumbar type vertebrae. Alignment: Grade 1 anterolisthesis at L4-L5. Mild retrolisthesis at L1-L2 and L2-L3. Vertebrae: Vertebral body heights are maintained. No acute fracture. Decreased osseous mineralization. Paraspinal and other soft tissues: Aortic atherosclerosis. Disc levels: Significant hypertrophic facet arthropathy throughout. Multilevel disc bulges. Ligamentum flavum thickening. Canal stenosis is greatest at L3-L4. No high-grade foraminal narrowing. IMPRESSION: Since chest CT of March 2022, there are new fractures including mild T1 compression fracture, marked T4 compression fracture, and fracture of the manubrium. These are not definitely acute; please correlate for history of interval significant trauma. Endplate retropulsion at T4 results in  minor canal stenosis. No lumbar spine fracture.  Multilevel lumbar spondylosis. Persistent 6 mm right upper lobe ground-glass nodule. Repeat CT is recommended every 2 years until 5 years of stability has been established. This recommendation follows the consensus statement: Guidelines for Management of Incidental Pulmonary Nodules Detected on CT Images: From the Fleischner Society 2017; Radiology 2017; 284:228-243. Electronically Signed   By: Macy Mis M.D.   On: 05/25/2021 14:46     EKG None  Radiology CT Thoracic Spine Wo Contrast  Result Date: 05/25/2021 CLINICAL DATA:  Mid and low back pain, trauma; fall last night EXAM: CT THORACIC AND LUMBAR SPINE WITHOUT CONTRAST TECHNIQUE: Multidetector CT imaging of the thoracic and lumbar spine was performed without contrast. Multiplanar CT image reconstructions were also generated. COMPARISON:  Correlation made with CT chest March 2022 and CT abdomen February 2022 FINDINGS: CT THORACIC SPINE FINDINGS Alignment: No significant anteroposterior listhesis. Mild osseous retropulsion at T4. Vertebrae: New mild loss of height at the superior endplate of T1. New marked loss of height of T4. Decreased osseous mineralization. Paraspinal and other soft tissues: Persistent 5 mm ground-glass nodule of the posterior right upper lobe (series 6, image 34). Interval fracture deformity of the manubrium. Disc levels: Osseous retropulsion at T4 results in minor canal stenosis. Facet spurring encroaches on the T4-T5 neural foramina. CT LUMBAR SPINE FINDINGS Segmentation: 5 lumbar type vertebrae. Alignment: Grade 1 anterolisthesis at L4-L5. Mild retrolisthesis at L1-L2 and L2-L3. Vertebrae: Vertebral body heights are maintained. No acute fracture. Decreased osseous mineralization. Paraspinal and other soft tissues: Aortic atherosclerosis. Disc levels: Significant hypertrophic facet arthropathy throughout. Multilevel disc bulges. Ligamentum flavum thickening. Canal stenosis is  greatest at L3-L4. No high-grade foraminal narrowing. IMPRESSION: Since chest CT of March 2022, there are new fractures including mild T1 compression fracture, marked T4 compression fracture, and fracture of the manubrium. These are not definitely acute; please correlate for history of interval significant trauma. Endplate retropulsion at T4 results in minor canal stenosis. No lumbar spine fracture.  Multilevel lumbar spondylosis. Persistent 6 mm right upper lobe ground-glass nodule. Repeat CT is recommended every 2 years until 5 years of stability has been established. This recommendation follows the consensus statement: Guidelines for Management of Incidental Pulmonary Nodules Detected on CT Images: From the Fleischner Society 2017; Radiology 2017; 284:228-243. Electronically Signed   By: Macy Mis M.D.   On: 05/25/2021 14:46   CT Lumbar Spine Wo Contrast  Result Date: 05/25/2021 CLINICAL DATA:  Mid and low back pain, trauma; fall last night EXAM: CT THORACIC AND LUMBAR SPINE WITHOUT CONTRAST TECHNIQUE: Multidetector CT imaging of the thoracic and lumbar spine was performed without contrast. Multiplanar CT image reconstructions were also generated. COMPARISON:  Correlation made with CT chest March 2022 and CT abdomen February 2022 FINDINGS: CT THORACIC SPINE FINDINGS Alignment: No significant anteroposterior listhesis. Mild  osseous retropulsion at T4. Vertebrae: New mild loss of height at the superior endplate of T1. New marked loss of height of T4. Decreased osseous mineralization. Paraspinal and other soft tissues: Persistent 5 mm ground-glass nodule of the posterior right upper lobe (series 6, image 34). Interval fracture deformity of the manubrium. Disc levels: Osseous retropulsion at T4 results in minor canal stenosis. Facet spurring encroaches on the T4-T5 neural foramina. CT LUMBAR SPINE FINDINGS Segmentation: 5 lumbar type vertebrae. Alignment: Grade 1 anterolisthesis at L4-L5. Mild retrolisthesis  at L1-L2 and L2-L3. Vertebrae: Vertebral body heights are maintained. No acute fracture. Decreased osseous mineralization. Paraspinal and other soft tissues: Aortic atherosclerosis. Disc levels: Significant hypertrophic facet arthropathy throughout. Multilevel disc bulges. Ligamentum flavum thickening. Canal stenosis is greatest at L3-L4. No high-grade foraminal narrowing. IMPRESSION: Since chest CT of March 2022, there are new fractures including mild T1 compression fracture, marked T4 compression fracture, and fracture of the manubrium. These are not definitely acute; please correlate for history of interval significant trauma. Endplate retropulsion at T4 results in minor canal stenosis. No lumbar spine fracture.  Multilevel lumbar spondylosis. Persistent 6 mm right upper lobe ground-glass nodule. Repeat CT is recommended every 2 years until 5 years of stability has been established. This recommendation follows the consensus statement: Guidelines for Management of Incidental Pulmonary Nodules Detected on CT Images: From the Fleischner Society 2017; Radiology 2017; 284:228-243. Electronically Signed   By: Macy Mis M.D.   On: 05/25/2021 14:46    Procedures Procedures   Medications Ordered in ED Medications  morphine 4 MG/ML injection 4 mg (has no administration in time range)  ondansetron (ZOFRAN) injection 4 mg (has no administration in time range)    ED Course  I have reviewed the triage vital signs and the nursing notes.  Pertinent labs & imaging results that were available during my care of the patient were reviewed by me and considered in my medical decision making (see chart for details).    MDM Rules/Calculators/A&P                          Labs and CT ordered from triage.   Reviewed nursing notes and prior charts for additional history.   CT reviewed/interpreted by me - T1 and T4 fxs, manubrium fx.   Labs reviewed/interpreted by me - chem normal.  Morphine iv. Zofran iv.    Discussed cts w pt.  Patients current pain is LS in location, severe, states too painful to turn/change position or walk. Of note, was on minimal pain control therapy at home.  MRI is pending.   Signed out to Dr Roslynn Amble to check MRI and reassess pain control post meds/imaging.   If MRI neg and pain controlled, potential d/c to home. If pain not controlled, and/or acute MR findings - would admit to medicine.    Final Clinical Impression(s) / ED Diagnoses Final diagnoses:  None    Rx / DC Orders ED Discharge Orders     None        Lajean Saver, MD 05/25/21 1515

## 2021-05-25 NOTE — ED Provider Notes (Signed)
Emergency Medicine Provider Triage Evaluation Note  Mckenzie [name] , a 75 y.o. female  was evaluated in triage.  Pt complains of low back/sacral pain that is severe, ongoing for approximately 3 weeks states that occurred after she fell.  She is on long-term steroids for rheumatologic issues is 75 years old as high risk for fractures.  Plain x-rays of the sacrum and coccyx and chest were obtained but patient's rheumatologist's were without any acute fractures.  She states that her pain seems to be continued.  She denies any urinary frequency urgency or dysuria denies any difficulty urinating denies any loss of bowel or bladder control.  Denies any weakness or numbness in either leg but states that the pain is severe enough that it causes her to have difficulty walking.  No fevers at home.  Review of Systems  Positive: Back pain, sacral pain Negative: Fever  Physical Exam  BP (!) 223/100 (BP Location: Right Arm)   Pulse 86   Temp 98.9 F (37.2 C) (Oral)   Resp 14   SpO2 99%  Gen:   Awake, uncomfortable Resp:  Normal effort  MSK:   Moves extremities but seems generally weak in all 4 extremities.  No unilateral weakness.  Some midline tenderness palpation of the lumbar, sacral and thoracic spine.  Medical Decision Making  Medically screening exam initiated at 1:18 PM.  Appropriate orders placed.  Mckenzie [name] was informed that the remainder of the evaluation will be completed by another provider, this initial triage assessment does not replace that evaluation, and the importance of remaining in the ED until their evaluation is complete.  Patient is a 75 year old female Here today for continued low back pain for 3 weeks now.  Concern for fracture that was missed on x-ray by rheumatology. No symptoms of cauda equina.  Seems to have difficulty walking secondary to pain because of weakness per patient.  Will begin with CT imaging.  Will obtain basic labs and EKG given patient being elderly  somewhat weak appearing and not have a history of frequent falls.     Tedd Sias, [place] 05/25/21 1329    Luna Fuse, MD 05/25/21 1445

## 2021-05-25 NOTE — ED Notes (Signed)
Report called to RN receiving pt in room. 

## 2021-05-25 NOTE — Progress Notes (Signed)
FYI or repeating - Lab result does not show change in inflammatory markers to suggest active arteritis with the prednisone tapering. Xrays of the chest and of the sacrum and coccyx do not show any acute fractures, thoracic spine compression fracture with wedging and significant L4-L5 spondylosis and facet arthropathy these could be sources of back pain but do not seem specifically related to her recent fall.

## 2021-05-25 NOTE — ED Notes (Signed)
Patient stats she wants to wait until the MRI scan for the pain medication.

## 2021-05-25 NOTE — ED Notes (Signed)
Remains in MRI 

## 2021-05-25 NOTE — ED Triage Notes (Addendum)
Patient sent to ED by rheumatologist for evaluation of possible pinched nerve after falling three weeks ago. Patient states she had a lower back x-ray which did not provide answer for cause of pain, patient states she is unable to walk due to pain.  Blood pressure in triage 223/100.

## 2021-05-26 ENCOUNTER — Encounter (HOSPITAL_COMMUNITY): Payer: Self-pay | Admitting: Internal Medicine

## 2021-05-26 ENCOUNTER — Other Ambulatory Visit: Payer: Self-pay

## 2021-05-26 DIAGNOSIS — S3210XA Unspecified fracture of sacrum, initial encounter for closed fracture: Secondary | ICD-10-CM | POA: Diagnosis not present

## 2021-05-26 DIAGNOSIS — I10 Essential (primary) hypertension: Secondary | ICD-10-CM | POA: Diagnosis not present

## 2021-05-26 DIAGNOSIS — S2221XA Fracture of manubrium, initial encounter for closed fracture: Secondary | ICD-10-CM | POA: Diagnosis not present

## 2021-05-26 LAB — SARS CORONAVIRUS 2 (TAT 6-24 HRS): SARS Coronavirus 2: NEGATIVE

## 2021-05-26 MED ORDER — IBUPROFEN 200 MG PO TABS
400.0000 mg | ORAL_TABLET | Freq: Three times a day (TID) | ORAL | Status: DC | PRN
  Administered 2021-05-26 – 2021-05-27 (×2): 400 mg via ORAL
  Filled 2021-05-26 (×2): qty 2

## 2021-05-26 MED ORDER — ENSURE ENLIVE PO LIQD
237.0000 mL | Freq: Two times a day (BID) | ORAL | Status: DC
  Administered 2021-05-26 – 2021-05-27 (×3): 237 mL via ORAL

## 2021-05-26 MED ORDER — MORPHINE SULFATE (PF) 2 MG/ML IV SOLN: 1.0000 mg | INTRAVENOUS | Status: DC | PRN

## 2021-05-26 NOTE — Progress Notes (Signed)
Progress Note    Mckenzie [name]  [id number] DOB: 1946-04-19  DOA: 05/25/2021 PCP: Loman Brooklyn, FNP      Brief Narrative:    Medical records reviewed and are as summarized below:  Mckenzie [name] is a 75 y.o. female with medical history significant for giant cell arteritis and polymyalgia rheumatica on prednisone and Actemra, hypertension, depression, hypercholesterolemia, who presented to the hospital because of severe low back pain continued inability to walk.  Back pain started after a fall on 05/07/2021.  Work-up revealed acute bilateral sacral alla fracture, mild T1 compression fracture, marked T4 compression fracture and fracture of manubrium.    Assessment/Plan:   Principal Problem:   Fracture of sacrum (HCC) Active Problems:   Essential hypertension   Compression fracture of T1 vertebra (HCC)   Compression fracture of T4 vertebra (HCC)   Fracture of manubrium   Body mass index is 21.46 kg/m.   Acute bilateral sacral ala fractures, mild T1 compression fracture, mild T4 compression fracture, fracture of manubrium: Continue analgesics as needed for pain.  Nonoperative management recommended for these fractures per neurosurgeon and orthopedic surgeon.  Giant cell arteritis and polymyalgia rheumatica: Continue prednisone  Hypertension, hypertensive urgency: BP is better.  Continue lisinopril  Acute hypoxia: Resolved.  She is tolerating room air.  No acute abnormality on chest x-ray.     Diet Order             Diet Carb Modified Fluid consistency: Thin; Room service appropriate? Yes  Diet effective now                      Consultants: Orthopedic surgeon and neurosurgeon  Procedures: None    Medications:    aspirin EC  81 mg Oral Daily   brimonidine  1 drop Both Eyes Q12H   And   timolol  1 drop Both Eyes Q12H   dorzolamide  1 drop Both Eyes BID   enoxaparin (LOVENOX) injection  40 mg Subcutaneous Q24H   escitalopram  20 mg  Oral Daily   feeding supplement  237 mL Oral BID BM   ferrous sulfate  325 mg Oral QODAY   fluorometholone  1 drop Right Eye BID   ketorolac  1 drop Right Eye QID   latanoprost  1 drop Both Eyes QHS   lisinopril  2.5 mg Oral Daily   multivitamin with minerals  1 tablet Oral Daily   Netarsudil Dimesylate  1 drop Both Eyes QHS   predniSONE  10 mg Oral Q breakfast   sodium chloride  1 g Oral BID WC   Continuous Infusions:   Anti-infectives (From admission, onward)    None              Family Communication/Anticipated D/C date and plan/Code Status   DVT prophylaxis: enoxaparin (LOVENOX) injection 40 mg Start: 05/25/21 2200     Code Status: Full Code  Family Communication: Husband at the bedside Disposition Plan:    Status is: Observation  The patient will require care spanning > 2 midnights and should be moved to inpatient because: Ongoing active pain requiring inpatient pain management and Unsafe d/c plan  Dispo: The patient is from: Home              Anticipated d/c is to: SNF              Patient currently is not medically stable to d/c.   Difficult to place patient No  Subjective:   C/o low back pain.  Her husband was at the bedside.  Objective:    Vitals:   05/25/21 1830 05/25/21 1831 05/25/21 2239 05/26/21 0749  BP: (!) 151/64 (!) 151/64 (!) 156/71 (!) 143/69  Pulse: 67 67 69 73  Resp:  '16 16 18  '$ Temp:  98.5 F (36.9 C) 99.3 F (37.4 C) 97.8 F (36.6 C)  TempSrc:  Oral Oral Oral  SpO2: 91% 91% 95% 92%  Weight:   58.5 kg   Height:   '5\' 5"'$  (1.651 m)    No data found.   Intake/Output Summary (Last 24 hours) at 05/26/2021 1149 Last data filed at 05/26/2021 0900 Gross per 24 hour  Intake 240 ml  Output 450 ml  Net -210 ml   Filed Weights   05/25/21 2239  Weight: 58.5 kg    Exam:  GEN: NAD SKIN: Multiple bruises on bilateral lower extremities.  Skin tears on anterior aspect of bilateral legs. EYES: EOMI ENT:  MMM CV: RRR PULM: CTA B ABD: soft, ND, NT, +BS CNS: AAO x 3, non focal EXT: No edema or tenderness MSK: Sacral spinal tenderness      Data Reviewed:   I have personally reviewed following labs and imaging studies:  Labs: Labs show the following:   Basic Metabolic Panel: Recent Labs  Lab 05/25/21 1336  NA 138  K 3.8  CL 101  CO2 25  GLUCOSE 129*  BUN 11  CREATININE 0.42*  CALCIUM 9.6   GFR Estimated Creatinine Clearance: 53.8 mL/min (A) (by C-G formula based on SCr of 0.42 mg/dL (L)). Liver Function Tests: Recent Labs  Lab 05/25/21 1336  AST 22  ALT 23  ALKPHOS 208*  BILITOT 0.9  PROT 6.4*  ALBUMIN 3.7   No results for input(s): LIPASE, AMYLASE in the last 168 hours. No results for input(s): AMMONIA in the last 168 hours. Coagulation profile No results for input(s): INR, PROTIME in the last 168 hours.  CBC: Recent Labs  Lab 05/25/21 1336  WBC 11.1*  NEUTROABS 9.6*  HGB 13.7  HCT 42.9  MCV 94.3  PLT 310   Cardiac Enzymes: No results for input(s): CKTOTAL, CKMB, CKMBINDEX, TROPONINI in the last 168 hours. BNP (last 3 results) No results for input(s): PROBNP in the last 8760 hours. CBG: No results for input(s): GLUCAP in the last 168 hours. D-Dimer: No results for input(s): DDIMER in the last 72 hours. Hgb A1c: No results for input(s): HGBA1C in the last 72 hours. Lipid Profile: No results for input(s): CHOL, HDL, LDLCALC, TRIG, CHOLHDL, LDLDIRECT in the last 72 hours. Thyroid function studies: No results for input(s): TSH, T4TOTAL, T3FREE, THYROIDAB in the last 72 hours.  Invalid input(s): FREET3 Anemia work up: No results for input(s): VITAMINB12, FOLATE, FERRITIN, TIBC, IRON, RETICCTPCT in the last 72 hours. Sepsis Labs: Recent Labs  Lab 05/25/21 1336  WBC 11.1*    Microbiology Recent Results (from the past 240 hour(s))  SARS CORONAVIRUS 2 (TAT 6-24 HRS) Nasopharyngeal Nasopharyngeal Swab     Status: None   Collection Time:  05/25/21  8:35 PM   Specimen: Nasopharyngeal Swab  Result Value Ref Range Status   SARS Coronavirus 2 NEGATIVE NEGATIVE Final    Comment: (NOTE) SARS-CoV-2 target nucleic acids are NOT DETECTED.  The SARS-CoV-2 RNA is generally detectable in upper and lower respiratory specimens during the acute phase of infection. Negative results do not preclude SARS-CoV-2 infection, do not rule out co-infections with other pathogens, and should not be  used as the sole basis for treatment or other patient management decisions. Negative results must be combined with clinical observations, patient history, and epidemiological information. The expected result is Negative.  Fact Sheet for Patients: SugarRoll.be  Fact Sheet for Healthcare Providers: https://www.woods-mathews.com/  This test is not yet approved or cleared by the [place] FDA and  has been authorized for detection and/or diagnosis of SARS-CoV-2 by FDA under an Emergency Use Authorization (EUA). This EUA will remain  in effect (meaning this test can be used) for the duration of the COVID-19 declaration under Se ction 564(b)(1) of the Act, 21 U.S.C. section 360bbb-3(b)(1), unless the authorization is terminated or revoked sooner.  Performed at [facility], [place] [address], [place], [place] [address]     Procedures and diagnostic studies:  CT Thoracic Spine Wo Contrast  Result Date: 05/25/2021 CLINICAL DATA:  Mid and low back pain, trauma; fall last night EXAM: CT THORACIC AND LUMBAR SPINE WITHOUT CONTRAST TECHNIQUE: Multidetector CT imaging of the thoracic and lumbar spine was performed without contrast. Multiplanar CT image reconstructions were also generated. COMPARISON:  Correlation made with CT chest March 2022 and CT abdomen February 2022 FINDINGS: CT THORACIC SPINE FINDINGS Alignment: No significant anteroposterior listhesis. Mild osseous retropulsion at T4. Vertebrae: New mild  loss of height at the superior endplate of T1. New marked loss of height of T4. Decreased osseous mineralization. Paraspinal and other soft tissues: Persistent 5 mm ground-glass nodule of the posterior right upper lobe (series 6, image 34). Interval fracture deformity of the manubrium. Disc levels: Osseous retropulsion at T4 results in minor canal stenosis. Facet spurring encroaches on the T4-T5 neural foramina. CT LUMBAR SPINE FINDINGS Segmentation: 5 lumbar type vertebrae. Alignment: Grade 1 anterolisthesis at L4-L5. Mild retrolisthesis at L1-L2 and L2-L3. Vertebrae: Vertebral body heights are maintained. No acute fracture. Decreased osseous mineralization. Paraspinal and other soft tissues: Aortic atherosclerosis. Disc levels: Significant hypertrophic facet arthropathy throughout. Multilevel disc bulges. Ligamentum flavum thickening. Canal stenosis is greatest at L3-L4. No high-grade foraminal narrowing. IMPRESSION: Since chest CT of March 2022, there are new fractures including mild T1 compression fracture, marked T4 compression fracture, and fracture of the manubrium. These are not definitely acute; please correlate for history of interval significant trauma. Endplate retropulsion at T4 results in minor canal stenosis. No lumbar spine fracture.  Multilevel lumbar spondylosis. Persistent 6 mm right upper lobe ground-glass nodule. Repeat CT is recommended every 2 years until 5 years of stability has been established. This recommendation follows the consensus statement: Guidelines for Management of Incidental Pulmonary Nodules Detected on CT Images: From the Fleischner Society 2017; Radiology 2017; 284:228-243. Electronically Signed   By: Macy Mis M.D.   On: 05/25/2021 14:46   CT Lumbar Spine Wo Contrast  Result Date: 05/25/2021 CLINICAL DATA:  Mid and low back pain, trauma; fall last night EXAM: CT THORACIC AND LUMBAR SPINE WITHOUT CONTRAST TECHNIQUE: Multidetector CT imaging of the thoracic and lumbar  spine was performed without contrast. Multiplanar CT image reconstructions were also generated. COMPARISON:  Correlation made with CT chest March 2022 and CT abdomen February 2022 FINDINGS: CT THORACIC SPINE FINDINGS Alignment: No significant anteroposterior listhesis. Mild osseous retropulsion at T4. Vertebrae: New mild loss of height at the superior endplate of T1. New marked loss of height of T4. Decreased osseous mineralization. Paraspinal and other soft tissues: Persistent 5 mm ground-glass nodule of the posterior right upper lobe (series 6, image 34). Interval fracture deformity of the manubrium. Disc levels: Osseous retropulsion at  T4 results in minor canal stenosis. Facet spurring encroaches on the T4-T5 neural foramina. CT LUMBAR SPINE FINDINGS Segmentation: 5 lumbar type vertebrae. Alignment: Grade 1 anterolisthesis at L4-L5. Mild retrolisthesis at L1-L2 and L2-L3. Vertebrae: Vertebral body heights are maintained. No acute fracture. Decreased osseous mineralization. Paraspinal and other soft tissues: Aortic atherosclerosis. Disc levels: Significant hypertrophic facet arthropathy throughout. Multilevel disc bulges. Ligamentum flavum thickening. Canal stenosis is greatest at L3-L4. No high-grade foraminal narrowing. IMPRESSION: Since chest CT of March 2022, there are new fractures including mild T1 compression fracture, marked T4 compression fracture, and fracture of the manubrium. These are not definitely acute; please correlate for history of interval significant trauma. Endplate retropulsion at T4 results in minor canal stenosis. No lumbar spine fracture.  Multilevel lumbar spondylosis. Persistent 6 mm right upper lobe ground-glass nodule. Repeat CT is recommended every 2 years until 5 years of stability has been established. This recommendation follows the consensus statement: Guidelines for Management of Incidental Pulmonary Nodules Detected on CT Images: From the Fleischner Society 2017; Radiology  2017; 284:228-243. Electronically Signed   By: Macy Mis M.D.   On: 05/25/2021 14:46   MR LUMBAR SPINE WO CONTRAST  Result Date: 05/25/2021 CLINICAL DATA:  Lumbar radiculopathy EXAM: MRI LUMBAR SPINE WITHOUT CONTRAST TECHNIQUE: Multiplanar, multisequence MR imaging of the lumbar spine was performed. No intravenous contrast was administered. COMPARISON:  None. FINDINGS: Segmentation:  Standard. Alignment: Grade 1 retrolisthesis at L1-2. Grade 1 anterolisthesis at L4-5. Vertebrae:  No fracture, evidence of discitis, or bone lesion. Conus medullaris and cauda equina: Conus extends to the L1 level. Conus and cauda equina appear normal. Paraspinal and other soft tissues: Negative. Disc levels: T12-L1: Normal disc space and facets. No spinal canal or neuroforaminal stenosis. L1-L2: Normal disc space and facets. No spinal canal or neuroforaminal stenosis. L2-L3: Mild facet hypertrophy. No disc herniation. No spinal canal or neural stenosis. L3-L4: Moderate facet hypertrophy with intermediate sized disc bulge. Ligamentum flavum redundancy. No central spinal canal or neural foraminal stenosis. L4-L5: Severe facet hypertrophy with grade 1 anterolisthesis and disc uncovering. Mild narrowing of both lateral recesses and both neural foramina. L5-S1: Small disc bulge with mild facet hypertrophy. No spinal canal or neural foraminal stenosis. Visualized sacrum: Normal. IMPRESSION: 1. Severe facet arthrosis at L4-L5 with grade 1 anterolisthesis and mild narrowing of both lateral recesses and both neural foramina. 2. Moderate L3-4 facet arthrosis without associated stenosis. Electronically Signed   By: Ulyses Jarred M.D.   On: 05/25/2021 19:12   MR SACRUM SI JOINTS WO CONTRAST  Result Date: 05/25/2021 CLINICAL DATA:  Lumbar radiculopathy, prior surgery, new symptoms EXAM: MRI SACRUM WITHOUT CONTRAST TECHNIQUE: Multiplanar multi-sequence MR imaging of the sacrum was performed. No intravenous contrast was administered.  COMPARISON:  Radiograph 05/23/2021, CT 05/25/2021 FINDINGS: Urinary Tract:  No abnormality visualized. Bowel: Sigmoid diverticuli without evidence of diverticulitis. Otherwise unremarkable. Vascular/Lymphatic: No pathologically enlarged lymph nodes. No significant vascular abnormality seen. Reproductive:  Unremarkable. Other:  None Musculoskeletal: There is bony edema at the level of S2-S3 with linear low intensity fracture lines extending vertically along the sacral ala bilaterally. These are nondisplaced and may represent insufficiency fractures. There is reactive muscle edema within the pelvic musculature. Bilateral hip osteoarthritis, partially visualized. IMPRESSION: Nondisplaced, acute bilateral sacral ala fractures, with extensive bony edema at the level of S2-S3. These may represent insufficiency fractures. Electronically Signed   By: Maurine Simmering M.D.   On: 05/25/2021 19:00  LOS: 0 days   Latacha Texeira  [facility]   Pager on [email or web address]. If 7PM-7AM, please contact night-coverage at [email or web address]     05/26/2021, 11:49 AM

## 2021-05-26 NOTE — Progress Notes (Signed)
Pt's husband had originally brought up the topic of staying with his wife overnight due to her slight dementia and previous falls at night (while at home). Pt was directed to seek permission from Dr. Mal Misty when he made his rounds, and this was also okayed by th unit director at this time. Pt's daughter will be spending the night in pt's room - [phone].

## 2021-05-26 NOTE — Plan of Care (Signed)
  Problem: Education: Goal: Knowledge of General Education information will improve Description: Including pain rating scale, medication(s)/side effects and non-pharmacologic comfort measures Outcome: Progressing   Problem: Clinical Measurements: Goal: Ability to maintain clinical measurements within normal limits will improve Outcome: Progressing Goal: Will remain free from infection Outcome: Progressing   Problem: Coping: Goal: Level of anxiety will decrease Outcome: Progressing   Problem: Elimination: Goal: Will not experience complications related to bowel motility Outcome: Progressing   Problem: Pain Managment: Goal: General experience of comfort will improve Outcome: Progressing   Problem: Safety: Goal: Ability to remain free from injury will improve Outcome: Progressing   Problem: Skin Integrity: Goal: Risk for impaired skin integrity will decrease Outcome: Progressing

## 2021-05-26 NOTE — Evaluation (Signed)
Physical Therapy Evaluation Patient Details Name: Mckenzie [name] MRN: [id number] DOB: 02/28/1946 Today's Date: 05/26/2021   History of Present Illness  Mckenzie [name] , a 75 y.o. female complains of low back/sacral pain that is severe, ongoing for approximately 3 weeks states that occurred after she fell.  She is on long-term steroids for rheumatologic issues is 75 years old as high risk for fractures. Denies any weakness or numbness in either leg but states that the pain is severe enough that it causes her to have difficulty walking. Patient with B sacral ala fractures.  PMH includes: HTN, depression, High cholesterol   Clinical Impression  Patient agreeable to PT assessment  husband present in room, then daughter arrived. Patient is slow to move. Requires cues and encouragement. She required mod assist for raising trunk to seated position. Min +2 for sit to stand. Ambulated with RW and min guard approximately 15 feet in room at slow, labored pace due to weakness and pain. Patient will continue to benefit from skilled PT while here to improve strength and functional mobility for safe return home. She would benefit from a short rehab stay to build strength.      Follow Up Recommendations SNF;Supervision for mobility/OOB    Equipment Recommendations  None recommended by PT    Recommendations for Other Services       Precautions / Restrictions Precautions Precautions: Fall Restrictions Weight Bearing Restrictions: No Other Position/Activity Restrictions: WBAT      Mobility  Bed Mobility Overal bed mobility: Needs Assistance Bed Mobility: Supine to Sit     Supine to sit: Mod assist     General bed mobility comments: Required assist to raise trunk to seated position. She requires cues and seems slow to initiate movement.    Transfers Overall transfer level: Needs assistance Equipment used: Rolling walker (2 wheeled) Transfers: Sit to/from Stand Sit to Stand: Min assist;+2  physical assistance         General transfer comment: patient requires min assist for safety, increased time to power up and get fully standing  Ambulation/Gait Ambulation/Gait assistance: Min guard;+2 physical assistance Gait Distance (Feet): 15 Feet Assistive device: Rolling walker (2 wheeled) Gait Pattern/deviations: Step-through pattern;Decreased step length - right;Decreased step length - left;Decreased stride length;Shuffle Gait velocity: decr   General Gait Details: Patient ambulated with RW and min guard +2. Limited by weakness, fatigue and pain. Slow pace  Stairs            Wheelchair Mobility    Modified Rankin (Stroke Patients Only)       Balance Overall balance assessment: Needs assistance;History of Falls Sitting-balance support: Feet supported Sitting balance-Leahy Scale: Good     Standing balance support: Bilateral upper extremity supported;During functional activity Standing balance-Leahy Scale: Fair Standing balance comment: reliant on B UE support and min guard for safety                             Pertinent Vitals/Pain Pain Assessment: Faces Faces Pain Scale: Hurts little more Pain Location: sacral area Pain Descriptors / Indicators: Discomfort;Sore Pain Intervention(s): Limited activity within patient's tolerance;Monitored during session;Repositioned;Premedicated before session    [place] expects to be discharged to:: Private residence Living Arrangements: Spouse/significant other Available Help at Discharge: Family;Available 24 hours/day Type of Home: House Home Access: Ramped entrance Entrance Stairs-Rails: None   Home Layout: One level Home Equipment: Shower seat;Grab bars - tub/shower;Bedside commode;Walker - standard;Grab bars - toilet;Toilet riser  Additional Comments: Husband ordered and installed a grab bar at tub that is also within reach of toilet to assist with transfers    Prior Function Level  of Independence: Needs assistance   Gait / Transfers Assistance Needed: pt requires minA for transfers intermittently and minG-minA for household ambulation at baseline  ADL's / Homemaking Assistance Needed: family assist with all ADLs  Comments: several falls in past year     Hand Dominance   Dominant Hand: Right    Extremity/Trunk Assessment   Upper Extremity Assessment Upper Extremity Assessment: Defer to OT evaluation    Lower Extremity Assessment Lower Extremity Assessment: Generalized weakness    Cervical / Trunk Assessment Cervical / Trunk Assessment: Normal  Communication   Communication: HOH  Cognition Arousal/Alertness: Awake/alert Behavior During Therapy: WFL for tasks assessed/performed Overall Cognitive Status: Within Functional Limits for tasks assessed                                        General Comments      Exercises     Assessment/Plan    PT Assessment Patient needs continued PT services  PT Problem List Decreased strength;Decreased mobility;Decreased safety awareness;Decreased activity tolerance;Decreased balance;Pain;Decreased knowledge of precautions       PT Treatment Interventions DME instruction;Therapeutic exercise;Gait training;Balance training;Stair training;Neuromuscular re-education;Functional mobility training;Therapeutic activities;Patient/family education    PT Goals (Current goals can be found in the Care Plan section)  Acute Rehab PT Goals Patient Stated Goal: to get stronger, get around better PT Goal Formulation: With patient/family Time For Goal Achievement: 06/09/21 Potential to Achieve Goals: Fair    Frequency Min 3X/week   Barriers to discharge Decreased caregiver support      Co-evaluation PT/OT/SLP Co-Evaluation/Treatment: Yes Reason for Co-Treatment: For patient/therapist safety;Necessary to address cognition/behavior during functional activity;To address functional/ADL transfers            AM-PAC PT "6 Clicks" Mobility  Outcome Measure Help needed turning from your back to your side while in a flat bed without using bedrails?: A Little Help needed moving from lying on your back to sitting on the side of a flat bed without using bedrails?: A Little Help needed moving to and from a bed to a chair (including a wheelchair)?: A Little Help needed standing up from a chair using your arms (e.g., wheelchair or bedside chair)?: A Little Help needed to walk in hospital room?: A Lot Help needed climbing 3-5 steps with a railing? : A Lot 6 Click Score: 16    End of Session Equipment Utilized During Treatment: Gait belt Activity Tolerance: Patient limited by fatigue;Patient limited by pain Patient left: in chair;with call bell/phone within reach;with chair alarm set;with family/visitor present Nurse Communication: Mobility status PT Visit Diagnosis: Unsteadiness on feet (R26.81);Other abnormalities of gait and mobility (R26.89);Pain;Muscle weakness (generalized) (M62.81);Difficulty in walking, not elsewhere classified (R26.2);Repeated falls (R29.6) Pain - Right/Left:  (B) Pain - part of body:  (sacral area)    Time: [id number] PT Time Calculation (min) (ACUTE ONLY): 30 min   Charges:   PT Evaluation $PT Eval Moderate Complexity: 1 Mod          Valerio Pinard, PT, GCS 05/26/21,1:15 PM

## 2021-05-26 NOTE — Evaluation (Signed)
Occupational Therapy Evaluation Patient Details Name: Mckenzie [name] MRN: [id number] DOB: 01/27/1946 Today's Date: 05/26/2021    History of Present Illness Mckenzie [name] , a 75 y.o. female complains of low back/sacral pain that is severe, ongoing for approximately 3 weeks states that occurred after she fell.  She is on long-term steroids for rheumatologic issues is 75 years old as high risk for fractures. Denies any weakness or numbness in either leg but states that the pain is severe enough that it causes her to have difficulty walking. Patient with B sacral ala fractures.  PMH includes: HTN, depression, High cholesterol   Clinical Impression   Pt admitted for concerns listed above. PTA pt reported that she was independent using a RW, with all ADL's. At this time, pt is limited by pain and increased weakness. She is requiring min A for functional mobility/transfers, and set up-min/mod A for ADL's. Pt ambulates slowly at this time and fatigues quickly, unable to ambulate throughout the entire room. Pt will benefit from SNF level therapies to improve strength and activity tolerance to return to mod I level. OT will follow acutely.     Follow Up Recommendations  SNF;Supervision/Assistance - 24 hour    Equipment Recommendations  None recommended by OT    Recommendations for Other Services       Precautions / Restrictions Precautions Precautions: Fall Restrictions Weight Bearing Restrictions: Yes Other Position/Activity Restrictions: WBAT      Mobility Bed Mobility Overal bed mobility: Needs Assistance Bed Mobility: Supine to Sit     Supine to sit: Mod assist     General bed mobility comments: Required assist to raise trunk to seated position. She requires cues and seems slow to initiate movement.    Transfers Overall transfer level: Needs assistance Equipment used: Rolling walker (2 wheeled) Transfers: Sit to/from Stand Sit to Stand: Min assist;+2 physical assistance          General transfer comment: patient requires min assist for safety, increased time to power up and get fully standing    Balance Overall balance assessment: Needs assistance;History of Falls Sitting-balance support: Feet supported Sitting balance-Leahy Scale: Good     Standing balance support: Bilateral upper extremity supported;During functional activity Standing balance-Leahy Scale: Fair Standing balance comment: reliant on B UE support and min guard for safety                           ADL either performed or assessed with clinical judgement   ADL Overall ADL's : Needs assistance/impaired Eating/Feeding: Set up;Sitting   Grooming: Set up;Sitting   Upper Body Bathing: Minimal assistance;Sitting   Lower Body Bathing: Minimal assistance;Moderate assistance;Sit to/from stand;Sitting/lateral leans   Upper Body Dressing : Set up;Sitting   Lower Body Dressing: Minimal assistance;Sit to/from stand   Toilet Transfer: Minimal assistance;Stand-pivot   Toileting- Clothing Manipulation and Hygiene: Minimal assistance;Sit to/from stand   Tub/ Shower Transfer: Minimal assistance;Stand-pivot   Functional mobility during ADLs: Minimal assistance;Rolling walker General ADL Comments: Pt limited by pain and activity tolerance, which is also affecting her balance. Pt requiring set up-min/mod A for all ADL's.     Vision Baseline Vision/History: No visual deficits Patient Visual Report: No change from baseline Vision Assessment?: No apparent visual deficits     Perception Perception Perception Tested?: No   Praxis Praxis Praxis tested?: Not tested    Pertinent Vitals/Pain Pain Assessment: Faces Faces Pain Scale: Hurts little more Pain Location: sacral area Pain Descriptors /  Indicators: Discomfort;Sore Pain Intervention(s): Limited activity within patient's tolerance;Monitored during session;Repositioned     Hand Dominance Right   Extremity/Trunk Assessment  Upper Extremity Assessment Upper Extremity Assessment: Generalized weakness   Lower Extremity Assessment Lower Extremity Assessment: Generalized weakness   Cervical / Trunk Assessment Cervical / Trunk Assessment: Normal   Communication Communication Communication: HOH   Cognition Arousal/Alertness: Awake/alert Behavior During Therapy: WFL for tasks assessed/performed Overall Cognitive Status: Within Functional Limits for tasks assessed                                     General Comments  VSS on RA    Exercises     Shoulder Instructions      Home Living Family/patient expects to be discharged to:: Private residence Living Arrangements: Spouse/significant other Available Help at Discharge: Family;Available 24 hours/day Type of Home: House Home Access: Ramped entrance   Entrance Stairs-Rails: None Home Layout: One level     Bathroom Shower/Tub: Teacher, early years/pre: Handicapped height Bathroom Accessibility: Yes How Accessible: Accessible via wheelchair Home Equipment: Shower seat;Grab bars - tub/shower;Bedside commode;Walker - standard;Grab bars - toilet;Toilet riser   Additional Comments: Husband ordered and installed a grab bar at tub that is also within reach of toilet to assist with transfers      Prior Functioning/Environment Level of Independence: Needs assistance  Gait / Transfers Assistance Needed: pt requires minA for transfers intermittently and minG-minA for household ambulation at baseline ADL's / Homemaking Assistance Needed: family assist with all ADLs   Comments: several falls in past year        OT Problem List: Decreased strength;Decreased activity tolerance;Impaired balance (sitting and/or standing);Decreased safety awareness;Decreased cognition;Decreased knowledge of use of DME or AE;Pain      OT Treatment/Interventions: Self-care/ADL training;Therapeutic exercise;Energy conservation;DME and/or AE  instruction;Therapeutic activities;Patient/family education;Balance training    OT Goals(Current goals can be found in the care plan section) Acute Rehab OT Goals Patient Stated Goal: to get stronger, get around better OT Goal Formulation: With patient/family Time For Goal Achievement: 06/09/21 Potential to Achieve Goals: Good ADL Goals Pt Will Perform Grooming: with modified independence;standing Pt Will Perform Lower Body Bathing: with modified independence;sitting/lateral leans;sit to/from stand Pt Will Perform Lower Body Dressing: with modified independence;sitting/lateral leans;sit to/from stand Pt Will Transfer to Toilet: with modified independence;ambulating Pt Will Perform Toileting - Clothing Manipulation and hygiene: with modified independence;sitting/lateral leans;sit to/from stand  OT Frequency: Min 2X/week   Barriers to D/C:    Only husbnad is home 24/7 and he can not provide much physical assistance to help pt.       Co-evaluation              AM-PAC OT "6 Clicks" Daily Activity     Outcome Measure Help from another person eating meals?: A Little Help from another person taking care of personal grooming?: A Little Help from another person toileting, which includes using toliet, bedpan, or urinal?: A Little Help from another person bathing (including washing, rinsing, drying)?: A Little Help from another person to put on and taking off regular upper body clothing?: A Little Help from another person to put on and taking off regular lower body clothing?: A Little 6 Click Score: 18   End of Session Equipment Utilized During Treatment: Gait belt;Rolling walker Nurse Communication: Mobility status  Activity Tolerance: Patient limited by pain;Patient limited by fatigue Patient left: in chair;with call bell/phone  within reach;with family/visitor present  OT Visit Diagnosis: Unsteadiness on feet (R26.81);Other abnormalities of gait and mobility (R26.89);Muscle weakness  (generalized) (M62.81);History of falling (Z91.81)                Time: [id number] OT Time Calculation (min): 30 min Charges:  OT General Charges $OT Visit: 1 Visit OT Evaluation $OT Eval Moderate Complexity: 1 Mod OT Treatments $Self Care/Home Management : 8-22 mins  Adyn Hoes H., OTR/L Acute Rehabilitation  Tobie Hellen Elane Yolanda Bonine 05/26/2021, 6:12 PM

## 2021-05-27 DIAGNOSIS — S3210XA Unspecified fracture of sacrum, initial encounter for closed fracture: Secondary | ICD-10-CM | POA: Diagnosis not present

## 2021-05-27 MED ORDER — ACETAMINOPHEN 325 MG PO TABS: 650.0000 mg | ORAL_TABLET | Freq: Four times a day (QID) | ORAL | Status: DC | PRN

## 2021-05-27 MED ORDER — PREDNISONE 5 MG PO TABS: ORAL_TABLET | ORAL | 0 refills | Status: DC

## 2021-05-27 NOTE — Care Management Obs Status (Signed)
[place] NOTIFICATION   Patient Details  Name: Mckenzie [name] MRN: [id number] Date of Birth: 1946-08-24   Medicare Observation Status Notification Given:  Yes    Joanne Chars, LCSW 05/27/2021, 9:20 AM

## 2021-05-27 NOTE — Progress Notes (Signed)
Pt and family is wanting to take pt home instead of going to SNF. Case manager has followed up with pt and family. Pt and family feels safe enough for d/c.

## 2021-05-27 NOTE — TOC Transition Note (Signed)
Transition of Care [facility]) - CM/SW Discharge Note   Patient Details  Name: DALICE HEDINGER MRN: [id number] Date of Birth: 1945-10-13  [facility] of [facility]) CM/SW Contact:  Sharin Mons, RN Phone Number: 05/27/2021, 1:13 PM   Clinical Narrative:    Patient will DC to: home Anticipated DC date: 05/27/2021 Family notified: yes, husband Transport by: car  Per MD patient ready for DC today. RN, patient and husband notified of DC. DME, W/C declined. Pt without Rx med concerns. Post hospital follow ups noted on AVS Husband will provide transportation to home.   RNCM will sign off for now as intervention is no longer needed. Please consult [place] again if new needs arise.   Final next level of care: [place] Barriers to Discharge: No Barriers Identified   Patient Goals and CMS Choice Patient states their goals for this hospitalization and ongoing recovery are:: "get back to the way I was" CMS Medicare.gov Compare Post Acute Care list provided to:: Patient Choice offered to / list presented to : Patient, Spouse  Discharge Placement                       Discharge Plan and Services In-house Referral: Clinical Social Work Discharge Planning Services: CM Consult Post Acute Care Choice: Home Health          DME Arranged: Youth worker wheelchair with seat cushion DME Agency: [employer] Date DME Agency Contacted: 05/27/21 Time DME Agency Contacted: 1119 Representative spoke with at DME Agency: [place]: PT, OT, NA [place] Agency: Other - See comment ([place]) Date [place]: 05/27/21 Time [place]: 1120 Representative spoke with at [place]: [place] ([place]) Interventions     Readmission Risk Interventions No flowsheet data found.

## 2021-05-27 NOTE — TOC Initial Note (Signed)
Transition of Care [facility]) - Initial/Assessment Note    Patient Details  Name: Mckenzie [name] MRN: [id number] Date of Birth: Jan 20, 1946  Transition of Care [facility]) CM/SW Contact:    Joanne Chars, LCSW Phone Number: 05/27/2021, 10:37 AM  Clinical Narrative:   CSW met with pt and husband Barnabas Lister to discuss discharge recommendation for SNF.  Daughter [name] on speakerphone.  Permission given to speak with husband and daughter.  They had questions about possible referral for CIR and CSW agreed to speak with PT/OT to see if this is an option.  If it is not, they would prefer to take pt home with [facility] rather than have her go to SNF.  Current DME in home: walker, shower chair.  PCP in place.  Pt is vaccinated for covid with one booster.    1000: CSW spoke with Clara Haynes/OT and she reports that CIR authorization through [facility] is unlikely due to pt diagnosis.  CSW passed this on to pt daughter [name] and husband Barnabas Lister.   CSW provided choice document for SNF and HH.    1030: After discussion, they would like to proceed with [facility] services.               Expected Discharge Plan: [place] Barriers to Discharge: No Barriers Identified   Patient Goals and CMS Choice Patient states their goals for this hospitalization and ongoing recovery are:: "get back to the way I was" CMS Medicare.gov Compare Post Acute Care list provided to:: Patient Choice offered to / list presented to : Patient  Expected Discharge Plan and Services Expected Discharge Plan: [place] In-house Referral: Clinical Social Work   Post Acute Care Choice: [place] arrangements for the past 2 months: [place]                                      Prior Living Arrangements/Services Living arrangements for the past 2 months: Single Family Home Lives with:: Spouse Patient language and need for interpreter reviewed:: Yes Do you feel safe going back to the place where  you live?: Yes      Need for Family Participation in Patient Care: Yes (Comment) Care giver support system in place?: Yes (comment) Current home services: Other (comment) (none) Criminal Activity/Legal Involvement Pertinent to Current Situation/Hospitalization: No - Comment as needed  Activities of Daily Living Home Assistive Devices/Equipment: Cane (specify quad or straight), Walker (specify type) ADL Screening (condition at time of admission) Patient's cognitive ability adequate to safely complete daily activities?: Yes Is the patient deaf or have difficulty hearing?: Yes Does the patient have difficulty seeing, even when wearing glasses/contacts?: Yes Does the patient have difficulty concentrating, remembering, or making decisions?: Yes Patient able to express need for assistance with ADLs?: Yes Does the patient have difficulty dressing or bathing?: Yes Independently performs ADLs?: No Communication: Independent Dressing (OT): Needs assistance Is this a change from baseline?: Pre-admission baseline Grooming: Independent Feeding: Independent Bathing: Needs assistance Is this a change from baseline?: Pre-admission baseline Toileting: Needs assistance Is this a change from baseline?: Pre-admission baseline In/Out Bed: Needs assistance Is this a change from baseline?: Pre-admission baseline Walks in Home: Independent with device (comment) Does the patient have difficulty walking or climbing stairs?: Yes Weakness of Legs: Both Weakness of Arms/Hands: None  Permission Sought/Granted Permission sought to share information with : Family Supports Permission granted  to share information with : Yes, Verbal Permission Granted  Share Information with NAME: husband Barnabas Lister, daughter [name]  Permission granted to share info w AGENCY: [facility]        Emotional Assessment Appearance:: Appears stated age Attitude/Demeanor/Rapport: Engaged Affect (typically observed): Appropriate,  Pleasant Orientation: :  (not recorded) Alcohol / Substance Use: Not Applicable Psych Involvement: No (comment)  Admission diagnosis:  Fracture of sacrum ([place]) [S32.10XA] Patient Active Problem List   Diagnosis Date Noted   Compression fracture of T1 vertebra ([place]) 05/25/2021   Compression fracture of T4 vertebra ([place]) 05/25/2021   Fracture of sacrum ([place]) 05/25/2021   Fracture of manubrium 05/25/2021   Low back pain 05/23/2021   Depression 01/23/2021   High risk medication use 01/02/2021   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[place]) 12/27/2020   Pericardial effusion 12/27/2020   Abnormal MRI, cervical spine 12/27/2020   Generalized weakness 12/27/2020   Steroid-induced hyperglycemia 12/27/2020   Malnutrition of moderate degree 12/11/2020   Pressure injury of skin 12/09/2020   Benign neoplasm of cecum    Benign neoplasm of descending colon    Hiatal hernia    Hyponatremia 12/08/2020   Thrombocytosis 12/08/2020   Hypoalbuminemia 12/08/2020   Memory loss 12/08/2020   Constipation 12/06/2020   Anemia of chronic disease 12/06/2020   Gait disturbance 11/14/2020   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 11/14/2020   Cognitive change 11/14/2020   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 [place] [address] Phone: [phone] Fax: [place], [place] [place] [address] Phone: [phone] Fax: [phone]     Social Determinants of Health (SDOH) Interventions    Readmission Risk Interventions No flowsheet data found.

## 2021-05-27 NOTE — Progress Notes (Cosign Needed Addendum)
    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  A walker will not resolve  issue with performing activities of daily living. A wheelchair will allow patient to safely perform daily activities. Patient is not able to propel themselves in the home using a standard weight wheelchair due to general weakness. Patient can self propel in the lightweight wheelchair. Length of need Lifetime. Accessories: elevating leg rests (ELRs), wheel locks, extensions and anti-tippers.   05/27/21 1117

## 2021-05-27 NOTE — TOC Progression Note (Addendum)
Transition of Care [facility]) - Progression Note    Patient Details  Name: Mckenzie [name] MRN: [id number] Date of Birth: 07-24-1946  [facility] CM/SW Contact  Sharin Mons, RN Phone Number: 05/27/2021, 11:22 AM  Clinical Narrative:    Pt declined SNF placement. Agreeable to home health services. States has used [facility] in the past and would like to use again. Referral made with [facility] and accepted by [facility]. DME: W/C requested by pt...order placed. Referral made with [facility] , eqiupment will be delivered to bedside prior to d/c.  [facility] team will continue to monitor and assist with TOC needs.   05/27/2021 12:50 pm  Per [facility] daughter cancelled referral for W/C. States will going to purchase one for pt.  Expected Discharge Plan: [place] Barriers to Discharge: Continued Medical Work up  Expected Discharge Plan and Services Expected Discharge Plan: [place] In-house Referral: Clinical Social Work Discharge Planning Services: CM Consult Post Acute Care Choice: [place] arrangements for the past 2 months: Single Family Home                 DME Arranged: Youth worker wheelchair with seat cushion DME Agency: [facility] Date DME Agency Contacted: 05/27/21 Time DME Agency Contacted: 1119 Representative spoke with at DME Agency: [place]: PT, OT, NA [place] Agency: Other - See comment ([place]) Date [place]: 05/27/21 Time [place]: 1120 Representative spoke with at [place]: [place] ([place]) Interventions    Readmission Risk Interventions No flowsheet data found.

## 2021-05-27 NOTE — Plan of Care (Signed)
  Problem: Education: Goal: Knowledge of General Education information will improve Description: Including pain rating scale, medication(s)/side effects and non-pharmacologic comfort measures Outcome: Adequate for Discharge   Problem: Health Behavior/Discharge Planning: Goal: Ability to manage health-related needs will improve Outcome: Adequate for Discharge   Problem: Clinical Measurements: Goal: Ability to maintain clinical measurements within normal limits will improve Outcome: Adequate for Discharge Goal: Will remain free from infection Outcome: Adequate for Discharge Goal: Diagnostic test results will improve Outcome: Adequate for Discharge Goal: Respiratory complications will improve Outcome: Adequate for Discharge Goal: Cardiovascular complication will be avoided Outcome: Adequate for Discharge   Problem: Activity: Goal: Risk for activity intolerance will decrease Outcome: Adequate for Discharge   Problem: Nutrition: Goal: Adequate nutrition will be maintained Outcome: Adequate for Discharge   Problem: Coping: Goal: Level of anxiety will decrease Outcome: Adequate for Discharge   Problem: Elimination: Goal: Will not experience complications related to bowel motility Outcome: Adequate for Discharge Goal: Will not experience complications related to urinary retention Outcome: Adequate for Discharge   Problem: Pain Managment: Goal: General experience of comfort will improve Outcome: Adequate for Discharge   Problem: Safety: Goal: Ability to remain free from injury will improve Outcome: Adequate for Discharge   Problem: Skin Integrity: Goal: Risk for impaired skin integrity will decrease Outcome: Adequate for Discharge   Problem: Acute Rehab PT Goals(only PT should resolve) Goal: Pt Will Go Supine/Side To Sit Outcome: Adequate for Discharge Goal: Pt Will Go Sit To Supine/Side Outcome: Adequate for Discharge Goal: Patient Will Transfer Sit To/From  Stand Outcome: Adequate for Discharge Goal: Pt Will Ambulate Outcome: Adequate for Discharge   Problem: Acute Rehab OT Goals (only OT should resolve) Goal: Pt. Will Perform Grooming Outcome: Adequate for Discharge Goal: Pt. Will Perform Lower Body Bathing Outcome: Adequate for Discharge Goal: Pt. Will Perform Lower Body Dressing Outcome: Adequate for Discharge Goal: Pt. Will Transfer To Toilet Outcome: Adequate for Discharge Goal: Pt. Will Perform Toileting-Clothing Manipulation Outcome: Adequate for Discharge

## 2021-05-27 NOTE — Discharge Summary (Addendum)
Physician Discharge Summary  Mckenzie [name] [id number] DOB: [birth year] DOA: 05/25/2021  PCP: Loman Brooklyn, FNP  Admit date: 05/25/2021 Discharge date: 05/27/2021  Discharge disposition: Home with home health therapy   Recommendations for Outpatient Follow-Up:   Follow-up with PCP in 1 week. Follow-up with Dr. Sherlene Shams, orthopedic surgeon, in 1 week   Discharge Diagnosis:   Principal Problem:   Fracture of sacrum [facility]) Active Problems:   Essential hypertension   Compression fracture of T1 vertebra (HCC)   Compression fracture of T4 vertebra (HCC)   Fracture of manubrium    Discharge Condition: Stable.  Diet recommendation:  Diet Order             Diet - low sodium heart healthy           Diet Carb Modified Fluid consistency: Thin; Room service appropriate? Yes  Diet effective now                     Code Status: Full Code     Hospital Course:   Ms. Mckenzie [name] is a 75 y.o. female with medical history significant for giant cell arteritis and polymyalgia rheumatica on prednisone and Actemra, hypertension, depression, hypercholesterolemia, who presented to the hospital because of severe low back pain continued inability to walk.  Back pain started after a fall on 05/07/2021.   Work-up revealed acute bilateral sacral alla fracture, mild T1 compression fracture, marked T4 compression fracture and fracture of manubrium.  Conservative management was recommended.  She was treated with analgesics.  PT and OT recommended discharge to SNF.  However, patient and family declined to go to SNF.  Patient prefers to go home with home health therapy.  Her pain is better and she is deemed stable for discharge to home today.  She and her family declined opiate analgesics for pain control because of concern that she will get addicted to narcotics.  Discharge plan was discussed with her husband at the bedside and daughter Mckenzie [name]) who was on speaker phone.      Discharge Exam:    Vitals:   05/26/21 0749 05/26/21 1451 05/26/21 1900 05/27/21 0756  BP: (!) 143/69 137/81 135/70 (!) 164/84  Pulse: 73 84 86 79  Resp: '18 18 17 18  '$ Temp: 97.8 F (36.6 C) 98 F (36.7 C) 97.8 F (36.6 C) 97.8 F (36.6 C)  TempSrc: Oral  Oral   SpO2: 92% 93% 93% 97%  Weight:      Height:         GEN: NAD SKIN: Multiple bruises on bilateral legs EYES: EOMI ENT: MMM CV: RRR PULM: CTA B ABD: soft, ND, NT, +BS CNS: AAO x 3, non focal EXT: No edema or tenderness MSK: Lumbosacral spinal tenderness   The results of significant diagnostics from this hospitalization (including imaging, microbiology, ancillary and laboratory) are listed below for reference.     Procedures and Diagnostic Studies:   CT Thoracic Spine Wo Contrast  Result Date: 05/25/2021 CLINICAL DATA:  Mid and low back pain, trauma; fall last night EXAM: CT THORACIC AND LUMBAR SPINE WITHOUT CONTRAST TECHNIQUE: Multidetector CT imaging of the thoracic and lumbar spine was performed without contrast. Multiplanar CT image reconstructions were also generated. COMPARISON:  Correlation made with CT chest March 2022 and CT abdomen February 2022 FINDINGS: CT THORACIC SPINE FINDINGS Alignment: No significant anteroposterior listhesis. Mild osseous retropulsion at T4. Vertebrae: New mild loss of height at the superior endplate of T1. New marked loss of  height of T4. Decreased osseous mineralization. Paraspinal and other soft tissues: Persistent 5 mm ground-glass nodule of the posterior right upper lobe (series 6, image 34). Interval fracture deformity of the manubrium. Disc levels: Osseous retropulsion at T4 results in minor canal stenosis. Facet spurring encroaches on the T4-T5 neural foramina. CT LUMBAR SPINE FINDINGS Segmentation: 5 lumbar type vertebrae. Alignment: Grade 1 anterolisthesis at L4-L5. Mild retrolisthesis at L1-L2 and L2-L3. Vertebrae: Vertebral body heights are maintained. No acute fracture.  Decreased osseous mineralization. Paraspinal and other soft tissues: Aortic atherosclerosis. Disc levels: Significant hypertrophic facet arthropathy throughout. Multilevel disc bulges. Ligamentum flavum thickening. Canal stenosis is greatest at L3-L4. No high-grade foraminal narrowing. IMPRESSION: Since chest CT of March 2022, there are new fractures including mild T1 compression fracture, marked T4 compression fracture, and fracture of the manubrium. These are not definitely acute; please correlate for history of interval significant trauma. Endplate retropulsion at T4 results in minor canal stenosis. No lumbar spine fracture.  Multilevel lumbar spondylosis. Persistent 6 mm right upper lobe ground-glass nodule. Repeat CT is recommended every 2 years until 5 years of stability has been established. This recommendation follows the consensus statement: Guidelines for Management of Incidental Pulmonary Nodules Detected on CT Images: From the Fleischner Society 2017; Radiology 2017; 284:228-243. Electronically Signed   By: Macy Mis M.D.   On: 05/25/2021 14:46   CT Lumbar Spine Wo Contrast  Result Date: 05/25/2021 CLINICAL DATA:  Mid and low back pain, trauma; fall last night EXAM: CT THORACIC AND LUMBAR SPINE WITHOUT CONTRAST TECHNIQUE: Multidetector CT imaging of the thoracic and lumbar spine was performed without contrast. Multiplanar CT image reconstructions were also generated. COMPARISON:  Correlation made with CT chest March 2022 and CT abdomen February 2022 FINDINGS: CT THORACIC SPINE FINDINGS Alignment: No significant anteroposterior listhesis. Mild osseous retropulsion at T4. Vertebrae: New mild loss of height at the superior endplate of T1. New marked loss of height of T4. Decreased osseous mineralization. Paraspinal and other soft tissues: Persistent 5 mm ground-glass nodule of the posterior right upper lobe (series 6, image 34). Interval fracture deformity of the manubrium. Disc levels: Osseous  retropulsion at T4 results in minor canal stenosis. Facet spurring encroaches on the T4-T5 neural foramina. CT LUMBAR SPINE FINDINGS Segmentation: 5 lumbar type vertebrae. Alignment: Grade 1 anterolisthesis at L4-L5. Mild retrolisthesis at L1-L2 and L2-L3. Vertebrae: Vertebral body heights are maintained. No acute fracture. Decreased osseous mineralization. Paraspinal and other soft tissues: Aortic atherosclerosis. Disc levels: Significant hypertrophic facet arthropathy throughout. Multilevel disc bulges. Ligamentum flavum thickening. Canal stenosis is greatest at L3-L4. No high-grade foraminal narrowing. IMPRESSION: Since chest CT of March 2022, there are new fractures including mild T1 compression fracture, marked T4 compression fracture, and fracture of the manubrium. These are not definitely acute; please correlate for history of interval significant trauma. Endplate retropulsion at T4 results in minor canal stenosis. No lumbar spine fracture.  Multilevel lumbar spondylosis. Persistent 6 mm right upper lobe ground-glass nodule. Repeat CT is recommended every 2 years until 5 years of stability has been established. This recommendation follows the consensus statement: Guidelines for Management of Incidental Pulmonary Nodules Detected on CT Images: From the Fleischner Society 2017; Radiology 2017; 284:228-243. Electronically Signed   By: Macy Mis M.D.   On: 05/25/2021 14:46   MR LUMBAR SPINE WO CONTRAST  Result Date: 05/25/2021 CLINICAL DATA:  Lumbar radiculopathy EXAM: MRI LUMBAR SPINE WITHOUT CONTRAST TECHNIQUE: Multiplanar, multisequence MR imaging of the lumbar spine was performed. No intravenous contrast was administered. COMPARISON:  None. FINDINGS: Segmentation:  Standard. Alignment: Grade 1 retrolisthesis at L1-2. Grade 1 anterolisthesis at L4-5. Vertebrae:  No fracture, evidence of discitis, or bone lesion. Conus medullaris and cauda equina: Conus extends to the L1 level. Conus and cauda equina  appear normal. Paraspinal and other soft tissues: Negative. Disc levels: T12-L1: Normal disc space and facets. No spinal canal or neuroforaminal stenosis. L1-L2: Normal disc space and facets. No spinal canal or neuroforaminal stenosis. L2-L3: Mild facet hypertrophy. No disc herniation. No spinal canal or neural stenosis. L3-L4: Moderate facet hypertrophy with intermediate sized disc bulge. Ligamentum flavum redundancy. No central spinal canal or neural foraminal stenosis. L4-L5: Severe facet hypertrophy with grade 1 anterolisthesis and disc uncovering. Mild narrowing of both lateral recesses and both neural foramina. L5-S1: Small disc bulge with mild facet hypertrophy. No spinal canal or neural foraminal stenosis. Visualized sacrum: Normal. IMPRESSION: 1. Severe facet arthrosis at L4-L5 with grade 1 anterolisthesis and mild narrowing of both lateral recesses and both neural foramina. 2. Moderate L3-4 facet arthrosis without associated stenosis. Electronically Signed   By: Ulyses Jarred M.D.   On: 05/25/2021 19:12   MR SACRUM SI JOINTS WO CONTRAST  Result Date: 05/25/2021 CLINICAL DATA:  Lumbar radiculopathy, prior surgery, new symptoms EXAM: MRI SACRUM WITHOUT CONTRAST TECHNIQUE: Multiplanar multi-sequence MR imaging of the sacrum was performed. No intravenous contrast was administered. COMPARISON:  Radiograph 05/23/2021, CT 05/25/2021 FINDINGS: Urinary Tract:  No abnormality visualized. Bowel: Sigmoid diverticuli without evidence of diverticulitis. Otherwise unremarkable. Vascular/Lymphatic: No pathologically enlarged lymph nodes. No significant vascular abnormality seen. Reproductive:  Unremarkable. Other:  None Musculoskeletal: There is bony edema at the level of S2-S3 with linear low intensity fracture lines extending vertically along the sacral ala bilaterally. These are nondisplaced and may represent insufficiency fractures. There is reactive muscle edema within the pelvic musculature. Bilateral hip  osteoarthritis, partially visualized. IMPRESSION: Nondisplaced, acute bilateral sacral ala fractures, with extensive bony edema at the level of S2-S3. These may represent insufficiency fractures. Electronically Signed   By: Maurine Simmering M.D.   On: 05/25/2021 19:00     Labs:   Basic Metabolic Panel: Recent Labs  Lab 05/25/21 1336  NA 138  K 3.8  CL 101  CO2 25  GLUCOSE 129*  BUN 11  CREATININE 0.42*  CALCIUM 9.6   GFR Estimated Creatinine Clearance: 53.8 mL/min (A) (by C-G formula based on SCr of 0.42 mg/dL (L)). Liver Function Tests: Recent Labs  Lab 05/25/21 1336  AST 22  ALT 23  ALKPHOS 208*  BILITOT 0.9  PROT 6.4*  ALBUMIN 3.7   No results for input(s): LIPASE, AMYLASE in the last 168 hours. No results for input(s): AMMONIA in the last 168 hours. Coagulation profile No results for input(s): INR, PROTIME in the last 168 hours.  CBC: Recent Labs  Lab 05/25/21 1336  WBC 11.1*  NEUTROABS 9.6*  HGB 13.7  HCT 42.9  MCV 94.3  PLT 310   Cardiac Enzymes: No results for input(s): CKTOTAL, CKMB, CKMBINDEX, TROPONINI in the last 168 hours. BNP: Invalid input(s): POCBNP CBG: No results for input(s): GLUCAP in the last 168 hours. D-Dimer No results for input(s): DDIMER in the last 72 hours. Hgb A1c No results for input(s): HGBA1C in the last 72 hours. Lipid Profile No results for input(s): CHOL, HDL, LDLCALC, TRIG, CHOLHDL, LDLDIRECT in the last 72 hours. Thyroid function studies No results for input(s): TSH, T4TOTAL, T3FREE, THYROIDAB in the last 72 hours.  Invalid input(s): FREET3 Anemia work up No results for input(s):  VITAMINB12, FOLATE, FERRITIN, TIBC, IRON, RETICCTPCT in the last 72 hours. Microbiology Recent Results (from the past 240 hour(s))  SARS CORONAVIRUS 2 (TAT 6-24 HRS) Nasopharyngeal Nasopharyngeal Swab     Status: None   Collection Time: 05/25/21  8:35 PM   Specimen: Nasopharyngeal Swab  Result Value Ref Range Status   SARS Coronavirus 2  NEGATIVE NEGATIVE Final    Comment: (NOTE) SARS-CoV-2 target nucleic acids are NOT DETECTED.  The SARS-CoV-2 RNA is generally detectable in upper and lower respiratory specimens during the acute phase of infection. Negative results do not preclude SARS-CoV-2 infection, do not rule out co-infections with other pathogens, and should not be used as the sole basis for treatment or other patient management decisions. Negative results must be combined with clinical observations, patient history, and epidemiological information. The expected result is Negative.  Fact Sheet for Patients: SugarRoll.be  Fact Sheet for Healthcare Providers: https://www.woods-mathews.com/  This test is not yet approved or cleared by the [place] FDA and  has been authorized for detection and/or diagnosis of SARS-CoV-2 by FDA under an Emergency Use Authorization (EUA). This EUA will remain  in effect (meaning this test can be used) for the duration of the COVID-19 declaration under Se ction 564(b)(1) of the Act, 21 U.S.C. section 360bbb-3(b)(1), unless the authorization is terminated or revoked sooner.  Performed at [facility], [place] [address] [place], [place] [address]      Discharge Instructions:   Discharge Instructions     Diet - low sodium heart healthy   Complete by: As directed    Increase activity slowly   Complete by: As directed       Allergies as of 05/27/2021       Reactions   Aldomet [methyldopa] Other (See Comments)   flu   Fosamax [alendronate Sodium]    Acetazolamide Rash   Buspar [buspirone] Palpitations        Medication List     STOP taking these medications    Accu-Chek Guide test strip Generic drug: glucose blood   Accu-Chek Softclix Lancets lancets   PenTips 32G X 4 MM Misc Generic drug: Insulin Pen Needle       TAKE these medications    acetaminophen 325 MG tablet Commonly known as: TYLENOL Take 2  tablets (650 mg total) by mouth every 6 (six) hours as needed for mild pain (or Fever >/= 101).   ACTEMRA IV Inject into the vein. '6mg'$ /kg every 28 days   Aspirin Low Dose 81 MG EC tablet Generic drug: aspirin TAKE 1 TABLET (81 MG TOTAL) BY MOUTH DAILY. SWALLOW WHOLE.   Combigan 0.2-0.5 % ophthalmic solution Generic drug: brimonidine-timolol Place 1 drop into both eyes every 12 (twelve) hours.   dorzolamide 2 % ophthalmic solution Commonly known as: TRUSOPT Place 1 drop into both eyes 2 (two) times daily.   escitalopram 20 MG tablet Commonly known as: LEXAPRO Take 1 tablet (20 mg total) by mouth daily.   ferrous sulfate 325 (65 FE) MG tablet Take 325 mg by mouth every other day.   fluorometholone 0.1 % ophthalmic suspension Commonly known as: FML Place 1 drop into the right eye 2 (two) times daily.   latanoprost 0.005 % ophthalmic solution Commonly known as: XALATAN Place 1 drop into both eyes at bedtime.   lisinopril 2.5 MG tablet Commonly known as: ZESTRIL Take 2.5 mg by mouth daily.   MAGNESIUM PO Take by mouth.   multivitamin tablet Take 1 tablet by mouth daily.  predniSONE 5 MG tablet Commonly known as: DELTASONE Take 2 tablets (10 mg) daily   Prolensa 0.07 % Soln Generic drug: Bromfenac Sodium Place 1 drop into the right eye daily.   Rhopressa 0.02 % Soln Generic drug: Netarsudil Dimesylate Place 1 drop into both eyes at bedtime.   sodium chloride 1 g tablet Take 1 g by mouth 2 (two) times daily with a meal.   Vitamin D2 50 MCG (2000 UT) Tabs Take 1 tablet by mouth daily.               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  A walker will not  resolve  issue with performing activities of daily living. A wheelchair will allow patient to safely perform daily activities. Patient is not able to propel themselves in the home using a standard weight wheelchair due to general weakness. Patient can self propel in the lightweight wheelchair. Length of need Lifetime. Accessories: elevating leg rests (ELRs), wheel locks, extensions and anti-tippers.   05/27/21 1117            Follow-up Information     [facility] Follow up.   Why: [place] will provide you home health PT,OT,Nurse Aide services once discharged. Start of care within 48 hours post discharge Contact information: [phone]        Mcarthur Rossetti, MD. Schedule an appointment as soon as possible for a visit in 1 week(s).   Specialty: Orthopedic Surgery Contact information: [place] [address] [phone]                  Time coordinating discharge: 31 minutes  Signed:  Jennye Boroughs  [facility] 05/27/2021, 1:12 PM   Pager on [email or web address]. If 7PM-7AM, please contact night-coverage at [email or web address]

## 2021-05-27 NOTE — Progress Notes (Signed)
1433 - D/C teaching was provided to pt at bedside. Pt's husband was present for the teaching and they both verbalized understanding of teaching at this time. Pt has secured all her belongings and is presently getting dressed with the assistance of Zach, tech. Pt has been escorted from the unit in a wheelchair by staff.

## 2021-05-27 NOTE — Plan of Care (Signed)

## 2021-05-29 ENCOUNTER — Encounter: Payer: Self-pay | Admitting: Family Medicine

## 2021-05-29 NOTE — Progress Notes (Signed)
Actemra shipment received from [facility] by [facility] intake team  [place] Saliva, PharmD, MPH, BCPS Clinical Pharmacist (Rheumatology and Pulmonology)

## 2021-05-31 ENCOUNTER — Telehealth: Payer: Self-pay | Admitting: Pharmacist

## 2021-05-31 DIAGNOSIS — M316 Other giant cell arteritis: Secondary | ICD-10-CM

## 2021-05-31 NOTE — Telephone Encounter (Signed)
Patient can be trained with demo for self-admin before infusion if husband is amenable to bringing patient into clinic  [place] Saliva, PharmD, MPH, BCPS Clinical Pharmacist (Rheumatology and Pulmonology)

## 2021-05-31 NOTE — Telephone Encounter (Signed)
Received fax from [facility] stating that there is SQ (Actpen and PFS) available for self-admin now and patient is eligible.  They have moved her approval from IV to SQ formulation. Faxed approval letter received from [facility]  Will reach out to patient's husband prior to sending rx to [facility] for SQ Actpen autoinjector : '162mg'$  once weekly  Patient has infusion on 06/05/21 and SQ formulation can be started on or after 07/03/21  [place] Saliva, PharmD, MPH, BCPS Clinical Pharmacist (Rheumatology and Pulmonology)

## 2021-06-04 ENCOUNTER — Other Ambulatory Visit (HOSPITAL_COMMUNITY): Payer: Self-pay | Admitting: *Deleted

## 2021-06-04 MED ORDER — ACTEMRA ACTPEN 162 MG/0.9ML ~~LOC~~ SOAJ: 162.0000 mg | SUBCUTANEOUS | 0 refills | Status: DC

## 2021-06-04 NOTE — Telephone Encounter (Signed)
Actemra Spoke with patient's husband regarding approval of Actemra for self-admin.   They will plan to stop by the clinic around 10-10:15am prior to infusion at [facility]. They have been advised that she will no longer need future infusions at [facility]. Will train patient and husband on self-administration and provide contact information to schedule shipment to home.  Patient can start self-administering on or after 07/03/21. Rx for Actemra '162mg'$  SQ every week sent to [facility] today to processing of rx can start (usually takes 3-5 business days)  [place] Saliva, PharmD, MPH, BCPS Clinical Pharmacist (Rheumatology and Pulmonology)

## 2021-06-05 ENCOUNTER — Other Ambulatory Visit: Payer: Self-pay

## 2021-06-05 ENCOUNTER — Ambulatory Visit: Payer: Medicare Other | Admitting: Pharmacist

## 2021-06-05 ENCOUNTER — Encounter (HOSPITAL_COMMUNITY)
Admission: RE | Admit: 2021-06-05 | Discharge: 2021-06-05 | Disposition: A | Discharge status: Home / Self Care | Payer: Medicare Other | Source: Ambulatory Visit | Attending: Internal Medicine | Admitting: Internal Medicine

## 2021-06-05 DIAGNOSIS — M316 Other giant cell arteritis: Secondary | ICD-10-CM

## 2021-06-05 DIAGNOSIS — Z79899 Other long term (current) drug therapy: Secondary | ICD-10-CM

## 2021-06-05 DIAGNOSIS — Z7189 Other specified counseling: Secondary | ICD-10-CM

## 2021-06-05 MED ORDER — DIPHENHYDRAMINE HCL 25 MG PO CAPS: 25.0000 mg | ORAL_CAPSULE | ORAL | Status: DC

## 2021-06-05 MED ORDER — TOCILIZUMAB 400 MG/20ML IV SOLN
6.0000 mg/kg | INTRAVENOUS | Status: DC
  Administered 2021-06-05: 344 mg via INTRAVENOUS
  Filled 2021-06-05: qty 8

## 2021-06-05 MED ORDER — ACETAMINOPHEN 325 MG PO TABS: 650.0000 mg | ORAL_TABLET | ORAL | Status: DC

## 2021-06-05 NOTE — Progress Notes (Signed)
Pharmacy Note  Subjective:   Patient's husband, Sonia Side, presents to clinic to be trained on Actemra self-injection. [facility]. Today's infusion will be her last infusion since she is transitioning to self-admin. Her husband, Sonia Side will be administering, the medication. Patient is in the car and unable to come in. She is currently wheelchair bound after recent fall, so he decided to come into office alone.  Objective: CMP     Component Value Date/Time   NA 138 05/25/2021 1336   NA 140 01/31/2021 0900   K 3.8 05/25/2021 1336   CL 101 05/25/2021 1336   CO2 25 05/25/2021 1336   GLUCOSE 129 (H) 05/25/2021 1336   BUN 11 05/25/2021 1336   BUN 13 01/31/2021 0900   CREATININE 0.42 (L) 05/25/2021 1336   CREATININE 0.66 04/25/2021 1053   CALCIUM 9.6 05/25/2021 1336   PROT 6.4 (L) 05/25/2021 1336   PROT 6.9 01/18/2021 1219   ALBUMIN 3.7 05/25/2021 1336   ALBUMIN 3.8 01/18/2021 1219   AST 22 05/25/2021 1336   ALT 23 05/25/2021 1336   ALKPHOS 208 (H) 05/25/2021 1336   BILITOT 0.9 05/25/2021 1336   BILITOT 0.3 01/18/2021 1219   GFRNONAA >60 05/25/2021 1336   GFRNONAA 90 01/23/2018 0803   GFRAA 120 12/01/2020 1127   GFRAA 104 01/23/2018 0803    CBC    Component Value Date/Time   WBC 11.1 (H) 05/25/2021 1336   RBC 4.55 05/25/2021 1336   HGB 13.7 05/25/2021 1336   HGB 11.7 01/31/2021 0900   HCT 42.9 05/25/2021 1336   HCT 36.9 01/31/2021 0900   PLT 310 05/25/2021 1336   PLT 383 01/31/2021 0900   MCV 94.3 05/25/2021 1336   MCV 82 01/31/2021 0900   MCH 30.1 05/25/2021 1336   MCHC 31.9 05/25/2021 1336   RDW 14.5 05/25/2021 1336   RDW 19.7 (H) 01/31/2021 0900   LYMPHSABS 1.0 05/25/2021 1336   LYMPHSABS 1.4 01/31/2021 0900   MONOABS 0.4 05/25/2021 1336   EOSABS 0.0 05/25/2021 1336   EOSABS 0.1 01/31/2021 0900   BASOSABS 0.0 05/25/2021 1336   BASOSABS 0.0 01/31/2021 0900    Baseline Immunosuppressant Therapy Labs TB GOLD Quantiferon TB Gold  Latest Ref Rng & Units 01/02/2021  Quantiferon TB Gold Plus NEGATIVE NEGATIVE   Hepatitis Panel Hepatitis Latest Ref Rng & Units 01/02/2021  Hep B Surface Ag NON-REACTI NON-REACTIVE  Hep B IgM NON-REACTI NON-REACTIVE  Hep C Ab NON-REACTI NON-REACTIVE  Hep C Ab NON-REACTI NON-REACTIVE   HIV Lab Results  Component Value Date   HIV NON-REACTIVE 01/02/2021   Immunoglobulins Immunoglobulin Electrophoresis Latest Ref Rng & Units 01/02/2021  IgA  70 - 320 mg/dL 169  IgG 600 - 1,540 mg/dL 1,508  IgM 50 - 300 mg/dL 153   SPEP Serum Protein Electrophoresis Latest Ref Rng & Units 05/25/2021  Total Protein 6.5 - 8.1 g/dL 6.4(L)  Albumin 2.9 - 4.4 g/dL -  Alpha-1 0.0 - 0.4 g/dL -  Alpha-2 0.4 - 1.0 g/dL -  Beta Globulin 0.7 - 1.3 g/dL -  Gamma Globulin 0.4 - 1.8 g/dL -  Interpretation - -   Chest x-ray: 05/23/21 - no active cardiopulmonary disease  Assessment/Plan:  Demonstrated proper injection technique with Actemra demo device.  Patient's husband able to demonstrate proper injection technique using the teach back method. We reviewed storage, administration sites, potential for injection site reaction, and how to manage injection site reaction. He plans to train his daughter, [name], on how  to use the autoinjector. I also printed information from manufacturer on how to administer   Actemra approved through [employer] patient assistance for SQ self-administration.   Rx for autoinjector sent to: [facility] for Actemra: [phone].  Sonia Side provided with pharmacy phone number and advised to call later this week to schedule shipment to home - he has been advised that the shipment turnaround time is around 2 weeks at this time.  She will be able to start self-admin formulation or after 07/03/21. Dose will be Actemra '162mg'$  SQ every week.  All questions encouraged and answered.  Instructed patient to call with any further questions or concerns.  Knox Saliva, PharmD, MPH,  BCPS Clinical Pharmacist (Rheumatology and Pulmonology)  06/05/2021 9:02 AM

## 2021-06-05 NOTE — Patient Instructions (Addendum)
You can start Actemra self-administration on  07/03/21 and every 7 days thereafter  Your prescription will be shipped from [facility]. Their phone number is [phone]. Please call to schedule shipment and confirm address. They will mail your medication to your home.  I reviewed with Dr. Benjamine Mola while Mr Cowdin was in clinic. The plan remains to reduce prednisone to 7.'5mg'$  once daily  (that is 1.5 tablets of '5mg'$  tablet)  How to manage an injection site reaction: Remember the 5 C's: COUNTER - leave on the counter at least 30 minutes but up to overnight to bring medication to room temperature. This may help prevent stinging COLD - place something cold (like an ice gel pack or cold water bottle) on the injection site just before cleansing with alcohol. This may help reduce pain CLARITIN - use Claritin (generic name is loratadine) for the first two weeks of treatment or the day of, the day before, and the day after injecting. This will help to minimize injection site reactions CORTISONE CREAM - apply if injection site is irritated and itching CALL ME - if injection site reaction is bigger than the size of your fist, looks infected, blisters, or if you develop hives

## 2021-06-12 ENCOUNTER — Other Ambulatory Visit: Payer: Medicare Other

## 2021-06-12 ENCOUNTER — Encounter: Payer: Self-pay | Admitting: Family Medicine

## 2021-06-12 ENCOUNTER — Other Ambulatory Visit: Payer: Self-pay

## 2021-06-14 ENCOUNTER — Encounter: Payer: Self-pay | Admitting: Family Medicine

## 2021-06-14 ENCOUNTER — Other Ambulatory Visit: Payer: Self-pay

## 2021-06-14 ENCOUNTER — Telehealth: Payer: Self-pay | Admitting: Family Medicine

## 2021-06-14 ENCOUNTER — Ambulatory Visit (INDEPENDENT_AMBULATORY_CARE_PROVIDER_SITE_OTHER): Payer: Medicare Other | Admitting: Family Medicine

## 2021-06-14 VITALS — BP 172/78 | HR 76 | Temp 97.6°F | Ht 65.0 in | Wt 131.4 lb

## 2021-06-14 DIAGNOSIS — R350 Frequency of micturition: Secondary | ICD-10-CM | POA: Diagnosis not present

## 2021-06-14 DIAGNOSIS — N3281 Overactive bladder: Secondary | ICD-10-CM | POA: Diagnosis not present

## 2021-06-14 MED ORDER — TOLTERODINE TARTRATE ER 2 MG PO CP24: 2.0000 mg | ORAL_CAPSULE | Freq: Every day | ORAL | 2 refills | Status: DC

## 2021-06-14 MED ORDER — MIRABEGRON ER 25 MG PO TB24: 25.0000 mg | ORAL_TABLET | Freq: Every day | ORAL | 2 refills | Status: DC

## 2021-06-14 NOTE — Telephone Encounter (Signed)
I sent a different one.

## 2021-06-14 NOTE — Progress Notes (Signed)
Assessment & Plan:  1. Frequent urination - UA negative for UTI  2. Overactive bladder - uncontrolled, new diagnosis - started on Myrbetriq - printed education provided on overactive bladder - has urology appointment scheduled for the end of the month - mirabegron ER (MYRBETRIQ) 25 MG TB24 tablet; Take 1 tablet (25 mg total) by mouth daily.  Dispense: 30 tablet; Refill: 2   Follow up plan: Return in about 4 weeks (around 07/12/2021) for follow-up of chronic medication conditions.  Lucile Crater, NP Student  I personally was present during the history, physical exam, and medical decision-making activities of this service and have verified that the service and findings are accurately documented in the nurse practitioner student's note.  Hendricks Limes, MSN, APRN, FNP-C [facility]   Subjective:   [name], female    DOB: [birth year], 75 y.o.   MRN: [id number]  HPI: Mckenzie [name] is a 75 y.o. female presenting on 06/14/2021 for Urinary Frequency (X 6 months but has gotten worse. Gets up every 20-30 mins during the night.)  She is accompanied by her husband who she is agreeable to be present.  She has had ongoing urinary frequency for approximately 6 months, progressing to getting up every 20-30 minutes at night. She uses a bedside commode at night and states she has to get up as soon as she has the urge or she is incontinent. Her husband states that they have not gotten much sleep since this started and they are exhausted.   ROS: Negative unless specifically indicated above in HPI.   Relevant past medical history reviewed and updated as indicated.   Allergies and medications reviewed and updated.   Current Outpatient Medications:    acetaminophen (TYLENOL) 325 MG tablet, Take 2 tablets (650 mg total) by mouth every 6 (six) hours as needed for mild pain (or Fever >/= 101)., Disp: , Rfl:    aspirin 81 MG EC tablet, TAKE 1 TABLET (81 MG  TOTAL) BY MOUTH DAILY. SWALLOW WHOLE., Disp: 30 tablet, Rfl: 0   COMBIGAN 0.2-0.5 % ophthalmic solution, Place 1 drop into both eyes every 12 (twelve) hours., Disp: , Rfl:    dorzolamide (TRUSOPT) 2 % ophthalmic solution, Place 1 drop into both eyes 2 (two) times daily., Disp: , Rfl:    Ergocalciferol (VITAMIN D2) 50 MCG (2000 UT) TABS, Take 1 tablet by mouth daily., Disp: 30 tablet, Rfl: 6   escitalopram (LEXAPRO) 20 MG tablet, Take 1 tablet (20 mg total) by mouth daily., Disp: 90 tablet, Rfl: 1   ferrous sulfate 325 (65 FE) MG tablet, Take 325 mg by mouth every other day., Disp: , Rfl:    fluorometholone (FML) 0.1 % ophthalmic suspension, Place 1 drop into the right eye 2 (two) times daily., Disp: , Rfl:    latanoprost (XALATAN) 0.005 % ophthalmic solution, Place 1 drop into both eyes at bedtime., Disp: , Rfl:    lisinopril (ZESTRIL) 2.5 MG tablet, Take 2.5 mg by mouth daily., Disp: , Rfl:    MAGNESIUM PO, Take by mouth., Disp: , Rfl:    mirabegron ER (MYRBETRIQ) 25 MG TB24 tablet, Take 1 tablet (25 mg total) by mouth daily., Disp: 30 tablet, Rfl: 2   Multiple Vitamin (MULTIVITAMIN) tablet, Take 1 tablet by mouth daily., Disp: , Rfl:    Netarsudil Dimesylate (RHOPRESSA) 0.02 % SOLN, Place 1 drop into both eyes at bedtime., Disp: , Rfl:    predniSONE (DELTASONE) 5 MG tablet, Take 2 tablets (10  mg) daily (Patient taking differently: 7.5 mg. Take 2 tablets (10 mg) daily), Disp: 70 tablet, Rfl: 0   PROLENSA 0.07 % SOLN, Place 1 drop into the right eye daily., Disp: , Rfl:    sodium chloride 1 g tablet, Take 1 g by mouth 2 (two) times daily with a meal., Disp: , Rfl:    Tocilizumab (ACTEMRA ACTPEN) 162 MG/0.9ML SOAJ, Inject 162 mg into the skin once a week., Disp: 10.8 mL, Rfl: 0  Allergies  Allergen Reactions   Aldomet [Methyldopa] Other (See Comments)    flu   Fosamax [Alendronate Sodium]    Acetazolamide Rash   Buspar [Buspirone] Palpitations    Objective:   BP (!) 172/78   Pulse 76    Temp 97.6 F (36.4 C) (Temporal)   Ht '5\' 5"'$  (1.651 m)   Wt 59.6 kg   BMI 21.87 kg/m    Physical Exam Constitutional:      General: She is not in acute distress.    Appearance: Normal appearance. She is normal weight. She is not ill-appearing, toxic-appearing or diaphoretic.  HENT:     Head: Normocephalic and atraumatic.     Nose: Nose normal.  Eyes:     Extraocular Movements: Extraocular movements intact.     Pupils: Pupils are equal, round, and reactive to light.  Cardiovascular:     Rate and Rhythm: Normal rate and regular rhythm.  Pulmonary:     Effort: Pulmonary effort is normal.  Abdominal:     Palpations: Abdomen is soft.  Musculoskeletal:        General: Normal range of motion.     Cervical back: Normal range of motion.  Skin:    General: Skin is warm and dry.  Neurological:     General: No focal deficit present.     Mental Status: She is alert and oriented to person, place, and time.     Motor: Weakness present.  Psychiatric:        Mood and Affect: Mood normal.        Behavior: Behavior normal.        Thought Content: Thought content normal.        Judgment: Judgment normal.

## 2021-06-15 ENCOUNTER — Telehealth: Payer: Self-pay

## 2021-06-15 NOTE — Telephone Encounter (Signed)
Galela from [facility] called stating that patients BP was 150/90 before activity and was 170/90 after activity. If you need to reach [facility] nurse her number is [phone]

## 2021-06-15 NOTE — Telephone Encounter (Signed)
Does family have a way to monitor BP at home and keep a log? If consistently >150/90, they need to come for medication adjustment.

## 2021-06-18 NOTE — Telephone Encounter (Signed)
Spoke with patient's husband.  They do have a blood pressure machine at home and will monitor and document her blood pressure this week and call us on Monday 06/25/21 with the results.

## 2021-06-20 ENCOUNTER — Encounter: Payer: Self-pay | Admitting: Family Medicine

## 2021-06-22 ENCOUNTER — Ambulatory Visit (INDEPENDENT_AMBULATORY_CARE_PROVIDER_SITE_OTHER): Payer: Medicare Other | Admitting: Family Medicine

## 2021-06-22 ENCOUNTER — Encounter: Payer: Self-pay | Admitting: Family Medicine

## 2021-06-22 DIAGNOSIS — I10 Essential (primary) hypertension: Secondary | ICD-10-CM

## 2021-06-22 MED ORDER — LISINOPRIL 10 MG PO TABS: 10.0000 mg | ORAL_TABLET | Freq: Every day | ORAL | 2 refills | Status: DC

## 2021-06-22 NOTE — Progress Notes (Signed)
Virtual Visit via Telephone Note  I connected with Mckenzie [name] on 06/22/21 at 10:50 AM by telephone and verified that I am speaking with the correct person using two identifiers. Mckenzie [name] is currently located at home and her husband is currently with her during this visit. The provider, Loman Brooklyn, FNP is located in their office at time of visit.  I discussed the limitations, risks, security and privacy concerns of performing an evaluation and management service by telephone and the availability of in person appointments. I also discussed with the patient that there may be a patient responsible charge related to this service. The patient expressed understanding and agreed to proceed.  Subjective: PCP: Loman Brooklyn, FNP  Chief Complaint  Patient presents with   Hypertension   Patient's daughter reports her blood pressure has been running high.  Daughter states her high blood pressures occur on days with and without medication.  She is currently taking lisinopril 5 mg every other day.  She has also been taking a salt pill due to previous issues with hyponatremia, which she was advised to discontinue 2 days ago. Husband reports today he has been giving her the Lisinopril daily for the past 2 days. This morning before medication her BP was 199/118 and 3.5 hours after Lisinopril 5 mg it was 164/91. They have a arm cuff that was verified for accuracy to a manual reading with the [facility] nurse.    ROS: Per HPI  Current Outpatient Medications:    acetaminophen (TYLENOL) 325 MG tablet, Take 2 tablets (650 mg total) by mouth every 6 (six) hours as needed for mild pain (or Fever >/= 101)., Disp: , Rfl:    aspirin 81 MG EC tablet, TAKE 1 TABLET (81 MG TOTAL) BY MOUTH DAILY. SWALLOW WHOLE., Disp: 30 tablet, Rfl: 0   COMBIGAN 0.2-0.5 % ophthalmic solution, Place 1 drop into both eyes every 12 (twelve) hours., Disp: , Rfl:    dorzolamide (TRUSOPT) 2 % ophthalmic solution, Place 1 drop  into both eyes 2 (two) times daily., Disp: , Rfl:    Ergocalciferol (VITAMIN D2) 50 MCG (2000 UT) TABS, Take 1 tablet by mouth daily., Disp: 30 tablet, Rfl: 6   escitalopram (LEXAPRO) 20 MG tablet, Take 1 tablet (20 mg total) by mouth daily., Disp: 90 tablet, Rfl: 1   ferrous sulfate 325 (65 FE) MG tablet, Take 325 mg by mouth every other day., Disp: , Rfl:    fluorometholone (FML) 0.1 % ophthalmic suspension, Place 1 drop into the right eye 2 (two) times daily., Disp: , Rfl:    latanoprost (XALATAN) 0.005 % ophthalmic solution, Place 1 drop into both eyes at bedtime., Disp: , Rfl:    lisinopril (ZESTRIL) 2.5 MG tablet, Take 2.5 mg by mouth daily., Disp: , Rfl:    MAGNESIUM PO, Take by mouth., Disp: , Rfl:    Multiple Vitamin (MULTIVITAMIN) tablet, Take 1 tablet by mouth daily., Disp: , Rfl:    Netarsudil Dimesylate (RHOPRESSA) 0.02 % SOLN, Place 1 drop into both eyes at bedtime., Disp: , Rfl:    predniSONE (DELTASONE) 5 MG tablet, Take 2 tablets (10 mg) daily (Patient taking differently: 7.5 mg. Take 2 tablets (10 mg) daily), Disp: 70 tablet, Rfl: 0   PROLENSA 0.07 % SOLN, Place 1 drop into the right eye daily., Disp: , Rfl:    sodium chloride 1 g tablet, Take 1 g by mouth 2 (two) times daily with a meal., Disp: , Rfl:  Tocilizumab (ACTEMRA ACTPEN) 162 MG/0.9ML SOAJ, Inject 162 mg into the skin once a week., Disp: 10.8 mL, Rfl: 0   tolterodine (DETROL LA) 2 MG 24 hr capsule, Take 1 capsule (2 mg total) by mouth daily., Disp: 30 capsule, Rfl: 2  Allergies  Allergen Reactions   Aldomet [Methyldopa] Other (See Comments)    flu   Fosamax [Alendronate Sodium]    Acetazolamide Rash   Buspar [Buspirone] Palpitations   Past Medical History:  Diagnosis Date   Depression    High cholesterol    Hypertension    Retinal micro-aneurysm of right eye     Observations/Objective: A&O  No respiratory distress or wheezing audible over the phone Mood, judgement, and thought processes all  WNL   Assessment and Plan: 1. Essential hypertension Uncontrolled.  Lisinopril increased from 5 mg every other day to 10 mg once daily.  Husband to continue keeping a log of her blood pressures.   Follow Up Instructions: Return in about 6 days (around 06/28/2021) for HTN.  I discussed the assessment and treatment plan with the patient. The patient was provided an opportunity to ask questions and all were answered. The patient agreed with the plan and demonstrated an understanding of the instructions.   The patient was advised to call back or seek an in-person evaluation if the symptoms worsen or if the condition fails to improve as anticipated.  The above assessment and management plan was discussed with the patient. The patient verbalized understanding of and has agreed to the management plan. Patient is aware to call the clinic if symptoms persist or worsen. Patient is aware when to return to the clinic for a follow-up visit. Patient educated on when it is appropriate to go to the emergency department.   Time call ended: 11:04  I provided 14 minutes of non-face-to-face time during this encounter.  Hendricks Limes, MSN, APRN, FNP-C [facility] 06/22/21

## 2021-06-24 ENCOUNTER — Telehealth: Payer: Self-pay | Admitting: Family Medicine

## 2021-06-24 NOTE — Telephone Encounter (Signed)
Call received from call center that family called due to elevated blood pressure on 06/22/2021 at 5:46 PM. They have been checking her blood pressure multiple times her day. She had a visit earlier today at which time Lisinopril was increased from 5 mg every other day to 10 mg daily. Asked nurse to tell them to stop checking so often as they are stressing her out and making it higher. Additionally they can give amlodipine 2.5 mg on top of the Lisinopril if her BP is [id number] systolic when they check it once per day at least one hour after the Lisinopril.

## 2021-06-25 ENCOUNTER — Telehealth: Payer: Self-pay

## 2021-06-25 ENCOUNTER — Emergency Department (HOSPITAL_COMMUNITY): Payer: Medicare Other

## 2021-06-25 ENCOUNTER — Encounter (HOSPITAL_COMMUNITY): Payer: Self-pay | Admitting: Emergency Medicine

## 2021-06-25 ENCOUNTER — Inpatient Hospital Stay (HOSPITAL_COMMUNITY)
Admission: EM | Admit: 2021-06-25 | Discharge: 2021-07-01 | DRG: 064 | Disposition: A | Discharge status: Home Health | Payer: Medicare Other | Attending: Internal Medicine | Admitting: Internal Medicine

## 2021-06-25 ENCOUNTER — Other Ambulatory Visit: Payer: Self-pay

## 2021-06-25 DIAGNOSIS — G459 Transient cerebral ischemic attack, unspecified: Secondary | ICD-10-CM | POA: Diagnosis not present

## 2021-06-25 DIAGNOSIS — Z825 Family history of asthma and other chronic lower respiratory diseases: Secondary | ICD-10-CM

## 2021-06-25 DIAGNOSIS — Z833 Family history of diabetes mellitus: Secondary | ICD-10-CM

## 2021-06-25 DIAGNOSIS — R4189 Other symptoms and signs involving cognitive functions and awareness: Secondary | ICD-10-CM

## 2021-06-25 DIAGNOSIS — M316 Other giant cell arteritis: Secondary | ICD-10-CM | POA: Diagnosis present

## 2021-06-25 DIAGNOSIS — G8321 Monoplegia of upper limb affecting right dominant side: Secondary | ICD-10-CM | POA: Diagnosis present

## 2021-06-25 DIAGNOSIS — F05 Delirium due to known physiological condition: Secondary | ICD-10-CM | POA: Diagnosis present

## 2021-06-25 DIAGNOSIS — Z87891 Personal history of nicotine dependence: Secondary | ICD-10-CM

## 2021-06-25 DIAGNOSIS — G9341 Metabolic encephalopathy: Secondary | ICD-10-CM | POA: Diagnosis present

## 2021-06-25 DIAGNOSIS — Z7982 Long term (current) use of aspirin: Secondary | ICD-10-CM

## 2021-06-25 DIAGNOSIS — Z8673 Personal history of transient ischemic attack (TIA), and cerebral infarction without residual deficits: Secondary | ICD-10-CM | POA: Diagnosis present

## 2021-06-25 DIAGNOSIS — M797 Fibromyalgia: Secondary | ICD-10-CM | POA: Diagnosis present

## 2021-06-25 DIAGNOSIS — E876 Hypokalemia: Secondary | ICD-10-CM | POA: Diagnosis present

## 2021-06-25 DIAGNOSIS — Z888 Allergy status to other drugs, medicaments and biological substances status: Secondary | ICD-10-CM

## 2021-06-25 DIAGNOSIS — F32A Depression, unspecified: Secondary | ICD-10-CM | POA: Diagnosis present

## 2021-06-25 DIAGNOSIS — Z83438 Family history of other disorder of lipoprotein metabolism and other lipidemia: Secondary | ICD-10-CM

## 2021-06-25 DIAGNOSIS — R29702 NIHSS score 2: Secondary | ICD-10-CM | POA: Diagnosis present

## 2021-06-25 DIAGNOSIS — Z818 Family history of other mental and behavioral disorders: Secondary | ICD-10-CM

## 2021-06-25 DIAGNOSIS — Z79899 Other long term (current) drug therapy: Secondary | ICD-10-CM

## 2021-06-25 DIAGNOSIS — Z8249 Family history of ischemic heart disease and other diseases of the circulatory system: Secondary | ICD-10-CM

## 2021-06-25 DIAGNOSIS — E7800 Pure hypercholesterolemia, unspecified: Secondary | ICD-10-CM | POA: Diagnosis present

## 2021-06-25 DIAGNOSIS — Z7952 Long term (current) use of systemic steroids: Secondary | ICD-10-CM

## 2021-06-25 DIAGNOSIS — E1159 Type 2 diabetes mellitus with other circulatory complications: Secondary | ICD-10-CM | POA: Diagnosis present

## 2021-06-25 DIAGNOSIS — R4182 Altered mental status, unspecified: Secondary | ICD-10-CM

## 2021-06-25 DIAGNOSIS — I639 Cerebral infarction, unspecified: Principal | ICD-10-CM | POA: Diagnosis present

## 2021-06-25 DIAGNOSIS — R4781 Slurred speech: Secondary | ICD-10-CM | POA: Diagnosis present

## 2021-06-25 DIAGNOSIS — Z20822 Contact with and (suspected) exposure to covid-19: Secondary | ICD-10-CM | POA: Diagnosis present

## 2021-06-25 DIAGNOSIS — M315 Giant cell arteritis with polymyalgia rheumatica: Secondary | ICD-10-CM | POA: Diagnosis present

## 2021-06-25 DIAGNOSIS — I10 Essential (primary) hypertension: Secondary | ICD-10-CM | POA: Diagnosis present

## 2021-06-25 DIAGNOSIS — R233 Spontaneous ecchymoses: Secondary | ICD-10-CM | POA: Diagnosis present

## 2021-06-25 LAB — CBC WITH DIFFERENTIAL/PLATELET
Abs Immature Granulocytes: 0.02 10*3/uL (ref 0.00–0.07)
Basophils Absolute: 0 10*3/uL (ref 0.0–0.1)
Basophils Relative: 0 %
Eosinophils Absolute: 0.1 10*3/uL (ref 0.0–0.5)
Eosinophils Relative: 1 %
HCT: 41 % (ref 36.0–46.0)
Hemoglobin: 13.6 g/dL (ref 12.0–15.0)
Immature Granulocytes: 0 %
Lymphocytes Relative: 24 %
Lymphs Abs: 2.1 10*3/uL (ref 0.7–4.0)
MCH: 30.9 pg (ref 26.0–34.0)
MCHC: 33.2 g/dL (ref 30.0–36.0)
MCV: 93.2 fL (ref 80.0–100.0)
Monocytes Absolute: 0.9 10*3/uL (ref 0.1–1.0)
Monocytes Relative: 10 %
Neutro Abs: 5.7 10*3/uL (ref 1.7–7.7)
Neutrophils Relative %: 65 %
Platelets: 237 10*3/uL (ref 150–400)
RBC: 4.4 MIL/uL (ref 3.87–5.11)
RDW: 13.2 % (ref 11.5–15.5)
WBC: 8.8 10*3/uL (ref 4.0–10.5)
nRBC: 0 % (ref 0.0–0.2)

## 2021-06-25 LAB — COMPREHENSIVE METABOLIC PANEL
ALT: 21 U/L (ref 0–44)
AST: 18 U/L (ref 15–41)
Albumin: 3.8 g/dL (ref 3.5–5.0)
Alkaline Phosphatase: 184 U/L — ABNORMAL HIGH (ref 38–126)
Anion gap: 6 (ref 5–15)
BUN: 20 mg/dL (ref 8–23)
CO2: 28 mmol/L (ref 22–32)
Calcium: 8.8 mg/dL — ABNORMAL LOW (ref 8.9–10.3)
Chloride: 101 mmol/L (ref 98–111)
Creatinine, Ser: 0.62 mg/dL (ref 0.44–1.00)
GFR, Estimated: >60 mL/min (ref >60)
Glucose, Bld: 110 mg/dL — ABNORMAL HIGH (ref 70–99)
Potassium: 3 mmol/L — ABNORMAL LOW (ref 3.5–5.1)
Sodium: 135 mmol/L (ref 135–145)
Total Bilirubin: 0.8 mg/dL (ref 0.3–1.2)
Total Protein: 6.3 g/dL — ABNORMAL LOW (ref 6.5–8.1)

## 2021-06-25 LAB — AMMONIA: Ammonia: 9 umol/L (ref 9–35)

## 2021-06-25 LAB — TSH: TSH: 1.907 u[IU]/mL (ref 0.350–4.500)

## 2021-06-25 LAB — C-REACTIVE PROTEIN: CRP: 0.5 mg/dL (ref <1.0)

## 2021-06-25 LAB — TROPONIN I (HIGH SENSITIVITY): Troponin I (High Sensitivity): 11 ng/L (ref <18)

## 2021-06-25 MED ORDER — POTASSIUM CHLORIDE 10 MEQ/100ML IV SOLN
10.0000 meq | INTRAVENOUS | Status: AC
  Administered 2021-06-26 (×2): 10 meq via INTRAVENOUS
  Filled 2021-06-25 (×2): qty 100

## 2021-06-25 NOTE — Telephone Encounter (Signed)
Patient's daughter [name] called requesting to speak with Dr. Marveen Reeks assistant regarding her mom's fluctuating blood pressure and Actemra medication.

## 2021-06-25 NOTE — ED Provider Notes (Signed)
[facility] EMERGENCY DEPARTMENT Provider Note   CSN: [id number] Arrival date & time: 06/25/21  2239     History Chief Complaint  Patient presents with   Altered Mental Status    Mckenzie [name] is a 75 y.o. female.  Patient with a history of hypertension, giant cell arteritis on chronic prednisone, fibromyalgia, recent admission for compression fractures of T-spine and sacrum and manubrium presenting with episode of difficulty speaking, confusion and weakness with her right arm.  Her husband states around 9 PM she seemed confused and had trouble getting her words out.  She is also not raise her right arm against gravity.  Symptoms lasted about 30 minutes and have since resolved.  He is concerned about a mini stroke.  Patient is back to baseline now in terms of her neuro status.  She denies any headache.  Denies any chest pain or shortness of breath.  No abdominal pain.  No fever, chills, nausea or vomiting. Husband reports her prednisone is being weaned down by her rheumatologist since she is currently on 7.5 mg daily.  Does not take any narcotics No fever or recent illnesses  The history is provided by the patient and the spouse.  Altered Mental Status Associated symptoms: weakness   Associated symptoms: no abdominal pain, no fever, no headaches, no nausea, no rash and no vomiting       Past Medical History:  Diagnosis Date   Depression    High cholesterol    Hypertension    Retinal micro-aneurysm of right eye     Patient Active Problem List   Diagnosis Date Noted   Compression fracture of T1 vertebra ([place]) 05/25/2021   Compression fracture of T4 vertebra ([place]) 05/25/2021   Fracture of sacrum ([place]) 05/25/2021   Fracture of manubrium 05/25/2021   Low back pain 05/23/2021   Depression 01/23/2021   High risk medication use 01/02/2021   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[place]) 12/27/2020   Pericardial effusion 12/27/2020   Abnormal MRI, cervical spine 12/27/2020   Generalized  weakness 12/27/2020   Steroid-induced hyperglycemia 12/27/2020   Malnutrition of moderate degree 12/11/2020   Pressure injury of skin 12/09/2020   Benign neoplasm of cecum    Benign neoplasm of descending colon    Hiatal hernia    Hyponatremia 12/08/2020   Thrombocytosis 12/08/2020   Hypoalbuminemia 12/08/2020   Memory loss 12/08/2020   Constipation 12/06/2020   Anemia of chronic disease 12/06/2020   Gait disturbance 11/14/2020   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 11/14/2020   Cognitive change 11/14/2020   Adjustment disorder with mixed anxiety and depressed mood 10/10/2020   Fracture 10/06/2020   Distal radius fracture [id number]   Acute metabolic encephalopathy [id number]   Proximal weakness of extremity 09/26/2020   Myalgia 09/26/2020   Fall at home, subsequent encounter 09/26/2020   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   Essential hypertension 01/11/2014   Vertigo 01/11/2014   Hyperlipidemia 01/11/2014    Past Surgical History:  Procedure Laterality Date   ARTERY BIOPSY Bilateral 12/15/2020   Procedure: BILATERAL TEMPORAL ARTERY BIOPSIES;  Surgeon: Angelia Mould, MD;  Location: [facility] OR;  Service: Vascular;  Laterality: Bilateral;   COLONOSCOPY WITH PROPOFOL N/A 12/09/2020   Procedure: COLONOSCOPY WITH PROPOFOL;  Surgeon: Gatha Mayer, MD;  Location: [facility] ENDOSCOPY;  Service: Endoscopy;  Laterality: N/A;   ESOPHAGOGASTRODUODENOSCOPY (EGD) WITH PROPOFOL N/A 12/09/2020   Procedure: ESOPHAGOGASTRODUODENOSCOPY (EGD) WITH PROPOFOL;  Surgeon: Gatha Mayer, MD;  Location: [place];  Service: Endoscopy;  Laterality: N/A;   EYE  SURGERY     FINGER SURGERY Left    RING FINGER   IR THORACENTESIS ASP PLEURAL SPACE W/IMG GUIDE  12/13/2020   OPEN REDUCTION INTERNAL FIXATION (ORIF) DISTAL RADIAL FRACTURE Left 10/05/2020   Procedure: OPEN REDUCTION INTERNAL FIXATION (ORIF) DISTAL RADIAL FRACTURE;  Surgeon: Shona Needles, MD;  Location: [place];  Service: Orthopedics;  Laterality: Left;   supraclavicular block   POLYPECTOMY  12/09/2020   Procedure: POLYPECTOMY;  Surgeon: Gatha Mayer, MD;  Location: [facility] ENDOSCOPY;  Service: Endoscopy;;   TUBAL LIGATION       OB History   No obstetric history on file.     Family History  Problem Relation Age of Onset   Hyperlipidemia Mother    Hypertension Mother    Diabetes Mother    COPD Father    Depression Sister    Suicidality Brother    Drug abuse Brother    Healthy Daughter    Healthy Daughter    Healthy Daughter    Colon cancer Neg Hx    Celiac disease Neg Hx    Inflammatory bowel disease Neg Hx     Social History   Tobacco Use   Smoking status: Former    Types: Cigarettes   Smokeless tobacco: Never   Tobacco comments:    for a short period  Media planner   Vaping Use: Never used  Substance Use Topics   Alcohol use: No   Drug use: No    Home Medications Prior to Admission medications   Medication Sig Start Date End Date Taking? Authorizing Provider  acetaminophen (TYLENOL) 325 MG tablet Take 2 tablets (650 mg total) by mouth every 6 (six) hours as needed for mild pain (or Fever >/= 101). 05/27/21   Jennye Boroughs, MD  aspirin 81 MG EC tablet TAKE 1 TABLET (81 MG TOTAL) BY MOUTH DAILY. SWALLOW WHOLE. 12/19/20 12/19/21  Dessa Phi, DO  COMBIGAN 0.2-0.5 % ophthalmic solution Place 1 drop into both eyes every 12 (twelve) hours. 12/01/13   [provider]  dorzolamide (TRUSOPT) 2 % ophthalmic solution Place 1 drop into both eyes 2 (two) times daily.    [provider]  Ergocalciferol (VITAMIN D2) 50 MCG (2000 UT) TABS Take 1 tablet by mouth daily. 04/30/21   Sater, Nanine Means, MD  escitalopram (LEXAPRO) 20 MG tablet Take 1 tablet (20 mg total) by mouth daily. 01/18/21   Loman Brooklyn, FNP  ferrous sulfate 325 (65 FE) MG tablet Take 325 mg by mouth every other day.    [provider]  fluorometholone (FML) 0.1 % ophthalmic suspension Place 1 drop into the right eye 2 (two) times daily.     [provider]  latanoprost (XALATAN) 0.005 % ophthalmic solution Place 1 drop into both eyes at bedtime. 12/29/15   [provider]  lisinopril (ZESTRIL) 10 MG tablet Take 1 tablet (10 mg total) by mouth daily. 06/22/21   Loman Brooklyn, FNP  MAGNESIUM PO Take by mouth.    [provider]  Multiple Vitamin (MULTIVITAMIN) tablet Take 1 tablet by mouth daily.    [provider]  Netarsudil Dimesylate (RHOPRESSA) 0.02 % SOLN Place 1 drop into both eyes at bedtime.    [provider]  predniSONE (DELTASONE) 5 MG tablet Take 2 tablets (10 mg) daily Patient taking differently: 7.5 mg. Take 2 tablets (10 mg) daily 05/27/21   Jennye Boroughs, MD  PROLENSA 0.07 % SOLN Place 1 drop into the right eye daily. 12/14/13  [provider]  sodium chloride 1 g tablet Take 1 g by mouth 2 (two) times daily with a meal.    [provider]  Tocilizumab (ACTEMRA ACTPEN) 162 MG/0.9ML SOAJ Inject 162 mg into the skin once a week. 06/04/21   Rice, Resa Miner, MD  tolterodine (DETROL LA) 2 MG 24 hr capsule Take 1 capsule (2 mg total) by mouth daily. 06/14/21   Loman Brooklyn, FNP    Allergies    Aldomet [methyldopa], Fosamax [alendronate sodium], Acetazolamide, and Buspar [buspirone]  Review of Systems   Review of Systems  Constitutional:  Negative for activity change, appetite change and fever.  HENT:  Negative for congestion and dental problem.   Respiratory:  Negative for cough, chest tightness and shortness of breath.   Cardiovascular:  Negative for chest pain.  Gastrointestinal:  Negative for abdominal pain, nausea and vomiting.  Genitourinary:  Negative for dysuria and hematuria.  Musculoskeletal:  Positive for arthralgias and myalgias.  Skin:  Negative for rash.  Neurological:  Positive for speech difficulty and weakness. Negative for dizziness and headaches.   all other systems are negative except as noted in the HPI and PMH.   Physical  Exam Updated Vital Signs BP (!) 177/68   Pulse 67   Temp 98.8 F (37.1 C)   Resp 16   Ht '5\' 5"'$  (1.651 m)   Wt 59 kg   SpO2 93%   BMI 21.64 kg/m   Physical Exam Vitals and nursing note reviewed.  Constitutional:      General: She is not in acute distress.    Appearance: She is well-developed.     Comments: Oriented to person and place  HENT:     Head: Normocephalic and atraumatic.     Mouth/Throat:     Pharynx: No oropharyngeal exudate.  Eyes:     Conjunctiva/sclera: Conjunctivae normal.     Pupils: Pupils are equal, round, and reactive to light.  Neck:     Comments: No meningismus. Cardiovascular:     Rate and Rhythm: Normal rate and regular rhythm.     Heart sounds: Normal heart sounds. No murmur heard. Pulmonary:     Effort: Pulmonary effort is normal. No respiratory distress.     Breath sounds: Normal breath sounds.  Abdominal:     Palpations: Abdomen is soft.     Tenderness: There is no abdominal tenderness. There is no guarding or rebound.  Musculoskeletal:        General: No tenderness. Normal range of motion.     Cervical back: Normal range of motion and neck supple.  Skin:    General: Skin is warm.  Neurological:     Mental Status: She is alert.     Cranial Nerves: No cranial nerve deficit.     Motor: No abnormal muscle tone.     Coordination: Coordination normal.     Comments: CN 2-12 intact, no ataxia on finger to nose, no nystagmus, 5/5 strength throughout, no pronator drift,  Follows commands, oriented to person and place   Psychiatric:        Behavior: Behavior normal.    ED Results / Procedures / Treatments   Labs (all labs ordered are listed, but only abnormal results are displayed) Labs Reviewed  COMPREHENSIVE METABOLIC PANEL - Abnormal; Notable for the following components:      Result Value   Potassium 3.0 (*)    Glucose, Bld 110 (*)    Calcium 8.8 (*)    Total Protein  6.3 (*)    Alkaline Phosphatase 184 (*)    All other components  within normal limits  URINALYSIS, ROUTINE W REFLEX MICROSCOPIC - Abnormal; Notable for the following components:   Ketones, ur 5 (*)    Protein, ur 30 (*)    All other components within normal limits  RESP PANEL BY RT-PCR (FLU A&B, COVID) ARPGX2  CBC WITH DIFFERENTIAL/PLATELET  AMMONIA  TSH  SEDIMENTATION RATE  C-REACTIVE PROTEIN  HEMOGLOBIN A1C  LIPID PANEL  COMPREHENSIVE METABOLIC PANEL  CBC  TROPONIN I (HIGH SENSITIVITY)  TROPONIN I (HIGH SENSITIVITY)    EKG None  Radiology CT HEAD WO CONTRAST (5MM)  Result Date: 06/25/2021 CLINICAL DATA:  Altered mental status. EXAM: CT HEAD WITHOUT CONTRAST TECHNIQUE: Contiguous axial images were obtained from the base of the skull through the vertex without intravenous contrast. COMPARISON:  December 08, 2020 FINDINGS: Brain: There is mild cerebral atrophy with widening of the extra-axial spaces and ventricular dilatation. There are areas of decreased attenuation within the white matter tracts of the supratentorial brain, consistent with microvascular disease changes. Small areas of cortical encephalomalacia, with adjacent chronic white matter low attenuation are seen within the bilateral cerebellar regions and parasagittal region of the right occipital lobe. Vascular: No hyperdense vessel or unexpected calcification. Skull: Normal. Negative for fracture or focal lesion. Sinuses/Orbits: No acute finding. Other: None. IMPRESSION: 1. No acute intracranial abnormality. 2. Generalized cerebral atrophy. 3. 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 Electronically Signed   By: Virgina Norfolk M.D.   On: 06/25/2021 23:34   DG Chest Portable 1 View  Result Date: 06/25/2021 CLINICAL DATA:  Altered mental status. EXAM: PORTABLE CHEST 1 VIEW COMPARISON:  Chest radiograph dated 05/23/2021. FINDINGS: Focal consolidation, pleural effusion or pneumothorax mild cardiomegaly. No acute osseous pathology. With degenerative changes of the spine and  shoulders. IMPRESSION: No active cardiopulmonary disease. Electronically Signed   By: Anner Crete M.D.   On: 06/25/2021 23:42    Procedures Procedures   Medications Ordered in ED Medications - No data to display  ED Course  I have reviewed the triage vital signs and the nursing notes.  Pertinent labs & imaging results that were available during my care of the patient were reviewed by me and considered in my medical decision making (see chart for details).    MDM Rules/Calculators/A&P                           Episode of right arm weakness, difficulty speaking and confusion, now resolved.  Nonfocal neurological exam.  Code stroke not activated due to resolution of symptoms  Work-up reassuring.  CT head is negative. Shows chronic infarcts.  Electrolytes show mild hypokalemia.  Aspirin given.  Blood pressure elevated will allow to stay elevated given likely TIA.  Patient remains at baseline.  Urinalysis is pending.  Admission discussed with Dr. Clearence Ped for TIA work-up. Final Clinical Impression(s) / ED Diagnoses Final diagnoses:  TIA (transient ischemic attack)    Rx / DC Orders ED Discharge Orders     None        Haylie Mccutcheon, Annie Main, MD 06/26/21 [phone]

## 2021-06-25 NOTE — Telephone Encounter (Signed)
BP fluctuates up and down after starting Actemera, BP was low before starting Actemera, patient was taking BP medication for kidney function, BP last night 220/118 is the highest, 115/70 at bed time last night, patient take BP medication in the AM, by afternoon blood pressure is elevated. Please advise   Patient having severe total body pain x 1 week,  patient is taking 7.5 mg prednisone. Patient cannot get out of bed due to her severe pain. Patient is screaming in pain.

## 2021-06-25 NOTE — ED Triage Notes (Signed)
Per family pt is not acting like herself. Pt thinks she is at [employer]. She is oriented to self and year.

## 2021-06-26 ENCOUNTER — Other Ambulatory Visit (HOSPITAL_COMMUNITY): Payer: Medicare Other

## 2021-06-26 ENCOUNTER — Observation Stay (HOSPITAL_COMMUNITY): Payer: Medicare Other

## 2021-06-26 DIAGNOSIS — M316 Other giant cell arteritis: Secondary | ICD-10-CM | POA: Diagnosis not present

## 2021-06-26 DIAGNOSIS — Z825 Family history of asthma and other chronic lower respiratory diseases: Secondary | ICD-10-CM | POA: Diagnosis not present

## 2021-06-26 DIAGNOSIS — I639 Cerebral infarction, unspecified: Secondary | ICD-10-CM | POA: Diagnosis present

## 2021-06-26 DIAGNOSIS — G459 Transient cerebral ischemic attack, unspecified: Secondary | ICD-10-CM | POA: Diagnosis present

## 2021-06-26 DIAGNOSIS — Z818 Family history of other mental and behavioral disorders: Secondary | ICD-10-CM | POA: Diagnosis not present

## 2021-06-26 DIAGNOSIS — Z7952 Long term (current) use of systemic steroids: Secondary | ICD-10-CM | POA: Diagnosis not present

## 2021-06-26 DIAGNOSIS — G8321 Monoplegia of upper limb affecting right dominant side: Secondary | ICD-10-CM | POA: Diagnosis present

## 2021-06-26 DIAGNOSIS — Z7982 Long term (current) use of aspirin: Secondary | ICD-10-CM | POA: Diagnosis not present

## 2021-06-26 DIAGNOSIS — E876 Hypokalemia: Secondary | ICD-10-CM | POA: Diagnosis present

## 2021-06-26 DIAGNOSIS — Z83438 Family history of other disorder of lipoprotein metabolism and other lipidemia: Secondary | ICD-10-CM | POA: Diagnosis not present

## 2021-06-26 DIAGNOSIS — Z8673 Personal history of transient ischemic attack (TIA), and cerebral infarction without residual deficits: Secondary | ICD-10-CM | POA: Diagnosis present

## 2021-06-26 DIAGNOSIS — Z87891 Personal history of nicotine dependence: Secondary | ICD-10-CM | POA: Diagnosis not present

## 2021-06-26 DIAGNOSIS — Z888 Allergy status to other drugs, medicaments and biological substances status: Secondary | ICD-10-CM | POA: Diagnosis not present

## 2021-06-26 DIAGNOSIS — Z79899 Other long term (current) drug therapy: Secondary | ICD-10-CM | POA: Diagnosis not present

## 2021-06-26 DIAGNOSIS — R29702 NIHSS score 2: Secondary | ICD-10-CM | POA: Diagnosis present

## 2021-06-26 DIAGNOSIS — F32A Depression, unspecified: Secondary | ICD-10-CM | POA: Diagnosis present

## 2021-06-26 DIAGNOSIS — Z833 Family history of diabetes mellitus: Secondary | ICD-10-CM | POA: Diagnosis not present

## 2021-06-26 DIAGNOSIS — G9341 Metabolic encephalopathy: Secondary | ICD-10-CM

## 2021-06-26 DIAGNOSIS — Z20822 Contact with and (suspected) exposure to covid-19: Secondary | ICD-10-CM | POA: Diagnosis present

## 2021-06-26 DIAGNOSIS — F05 Delirium due to known physiological condition: Secondary | ICD-10-CM | POA: Diagnosis present

## 2021-06-26 DIAGNOSIS — R4189 Other symptoms and signs involving cognitive functions and awareness: Secondary | ICD-10-CM | POA: Diagnosis not present

## 2021-06-26 DIAGNOSIS — Z8249 Family history of ischemic heart disease and other diseases of the circulatory system: Secondary | ICD-10-CM | POA: Diagnosis not present

## 2021-06-26 DIAGNOSIS — E7800 Pure hypercholesterolemia, unspecified: Secondary | ICD-10-CM | POA: Diagnosis present

## 2021-06-26 DIAGNOSIS — M797 Fibromyalgia: Secondary | ICD-10-CM | POA: Diagnosis present

## 2021-06-26 DIAGNOSIS — R4781 Slurred speech: Secondary | ICD-10-CM | POA: Diagnosis present

## 2021-06-26 DIAGNOSIS — M315 Giant cell arteritis with polymyalgia rheumatica: Secondary | ICD-10-CM | POA: Diagnosis present

## 2021-06-26 DIAGNOSIS — I10 Essential (primary) hypertension: Secondary | ICD-10-CM | POA: Diagnosis present

## 2021-06-26 LAB — HEMOGLOBIN A1C
Hgb A1c MFr Bld: 5.2 % (ref 4.8–5.6)
Hgb A1c MFr Bld: 5.2 % (ref 4.8–5.6)
Mean Plasma Glucose: 102.54 mg/dL
Mean Plasma Glucose: 102.54 mg/dL

## 2021-06-26 LAB — RESP PANEL BY RT-PCR (FLU A&B, COVID) ARPGX2
Influenza A by PCR: NEGATIVE
Influenza B by PCR: NEGATIVE
SARS Coronavirus 2 by RT PCR: NEGATIVE

## 2021-06-26 LAB — COMPREHENSIVE METABOLIC PANEL
ALT: 20 U/L (ref 0–44)
AST: 18 U/L (ref 15–41)
Albumin: 3.4 g/dL — ABNORMAL LOW (ref 3.5–5.0)
Alkaline Phosphatase: 172 U/L — ABNORMAL HIGH (ref 38–126)
Anion gap: 6 (ref 5–15)
BUN: 16 mg/dL (ref 8–23)
CO2: 29 mmol/L (ref 22–32)
Calcium: 8.6 mg/dL — ABNORMAL LOW (ref 8.9–10.3)
Chloride: 101 mmol/L (ref 98–111)
Creatinine, Ser: 0.55 mg/dL (ref 0.44–1.00)
GFR, Estimated: >60 mL/min (ref >60)
Glucose, Bld: 105 mg/dL — ABNORMAL HIGH (ref 70–99)
Potassium: 3 mmol/L — ABNORMAL LOW (ref 3.5–5.1)
Sodium: 136 mmol/L (ref 135–145)
Total Bilirubin: 0.8 mg/dL (ref 0.3–1.2)
Total Protein: 5.7 g/dL — ABNORMAL LOW (ref 6.5–8.1)

## 2021-06-26 LAB — LIPID PANEL
Cholesterol: 267 mg/dL — ABNORMAL HIGH (ref 0–200)
HDL: 49 mg/dL (ref >40)
LDL Cholesterol: 188 mg/dL — ABNORMAL HIGH (ref 0–99)
Total CHOL/HDL Ratio: 5.4 RATIO
Triglycerides: 152 mg/dL — ABNORMAL HIGH (ref <150)
VLDL: 30 mg/dL (ref 0–40)

## 2021-06-26 LAB — URINALYSIS, ROUTINE W REFLEX MICROSCOPIC
Bacteria, UA: NONE SEEN
Bilirubin Urine: NEGATIVE
Glucose, UA: NEGATIVE mg/dL
Hgb urine dipstick: NEGATIVE
Ketones, ur: 5 mg/dL — AB
Leukocytes,Ua: NEGATIVE
Nitrite: NEGATIVE
Protein, ur: 30 mg/dL — AB
Specific Gravity, Urine: 1.018 (ref 1.005–1.030)
pH: 5 (ref 5.0–8.0)

## 2021-06-26 LAB — SEDIMENTATION RATE: Sed Rate: 15 mm/hr (ref 0–22)

## 2021-06-26 LAB — CBC
HCT: 39.8 % (ref 36.0–46.0)
Hemoglobin: 13.2 g/dL (ref 12.0–15.0)
MCH: 30.8 pg (ref 26.0–34.0)
MCHC: 33.2 g/dL (ref 30.0–36.0)
MCV: 92.8 fL (ref 80.0–100.0)
Platelets: 208 10*3/uL (ref 150–400)
RBC: 4.29 MIL/uL (ref 3.87–5.11)
RDW: 13.1 % (ref 11.5–15.5)
WBC: 7.7 10*3/uL (ref 4.0–10.5)
nRBC: 0 % (ref 0.0–0.2)

## 2021-06-26 LAB — TROPONIN I (HIGH SENSITIVITY): Troponin I (High Sensitivity): 10 ng/L (ref <18)

## 2021-06-26 LAB — MAGNESIUM: Magnesium: 2.3 mg/dL (ref 1.7–2.4)

## 2021-06-26 MED ORDER — POTASSIUM CHLORIDE 10 MEQ/100ML IV SOLN: 10.0000 meq | INTRAVENOUS | Status: DC

## 2021-06-26 MED ORDER — POTASSIUM CHLORIDE 10 MEQ/100ML IV SOLN
10.0000 meq | INTRAVENOUS | Status: AC
  Administered 2021-06-26 (×2): 10 meq via INTRAVENOUS
  Filled 2021-06-26 (×2): qty 100

## 2021-06-26 MED ORDER — IPRATROPIUM-ALBUTEROL 0.5-2.5 (3) MG/3ML IN SOLN: 3.0000 mL | RESPIRATORY_TRACT | Status: DC | PRN

## 2021-06-26 MED ORDER — PREDNISONE 10 MG PO TABS
10.0000 mg | ORAL_TABLET | Freq: Every day | ORAL | Status: DC
  Administered 2021-06-26 – 2021-07-01 (×6): 10 mg via ORAL
  Filled 2021-06-26 (×6): qty 1

## 2021-06-26 MED ORDER — SENNOSIDES-DOCUSATE SODIUM 8.6-50 MG PO TABS: 1.0000 | ORAL_TABLET | Freq: Every evening | ORAL | Status: DC | PRN

## 2021-06-26 MED ORDER — TRAZODONE HCL 50 MG PO TABS
50.0000 mg | ORAL_TABLET | Freq: Every evening | ORAL | Status: DC | PRN
  Administered 2021-06-27 – 2021-06-28 (×2): 50 mg via ORAL
  Filled 2021-06-26 (×3): qty 1

## 2021-06-26 MED ORDER — ATORVASTATIN CALCIUM 40 MG PO TABS
40.0000 mg | ORAL_TABLET | Freq: Every day | ORAL | Status: DC
  Administered 2021-06-26 – 2021-07-01 (×6): 40 mg via ORAL
  Filled 2021-06-26 (×6): qty 1

## 2021-06-26 MED ORDER — HALOPERIDOL 0.5 MG PO TABS
1.0000 mg | ORAL_TABLET | Freq: Four times a day (QID) | ORAL | Status: DC | PRN
  Filled 2021-06-26: qty 2

## 2021-06-26 MED ORDER — HYDRALAZINE HCL 20 MG/ML IJ SOLN
10.0000 mg | INTRAMUSCULAR | Status: DC | PRN
  Administered 2021-06-26: 10 mg via INTRAVENOUS
  Filled 2021-06-26: qty 1

## 2021-06-26 MED ORDER — METOPROLOL TARTRATE 5 MG/5ML IV SOLN: 5.0000 mg | INTRAVENOUS | Status: DC | PRN

## 2021-06-26 MED ORDER — TIMOLOL MALEATE 0.5 % OP SOLN
1.0000 [drp] | Freq: Two times a day (BID) | OPHTHALMIC | Status: DC
  Administered 2021-06-26 – 2021-06-30 (×9): 1 [drp] via OPHTHALMIC
  Filled 2021-06-26 (×2): qty 5

## 2021-06-26 MED ORDER — PREDNISONE 10 MG PO TABS: 5.0000 mg | ORAL_TABLET | Freq: Every day | ORAL | Status: DC

## 2021-06-26 MED ORDER — DORZOLAMIDE HCL 2 % OP SOLN
1.0000 [drp] | Freq: Two times a day (BID) | OPHTHALMIC | Status: DC
  Administered 2021-06-26 – 2021-06-30 (×9): 1 [drp] via OPHTHALMIC
  Filled 2021-06-26: qty 10

## 2021-06-26 MED ORDER — BRIMONIDINE TARTRATE-TIMOLOL 0.2-0.5 % OP SOLN
1.0000 [drp] | Freq: Two times a day (BID) | OPHTHALMIC | Status: DC
  Filled 2021-06-26: qty 5

## 2021-06-26 MED ORDER — LISINOPRIL 10 MG PO TABS
10.0000 mg | ORAL_TABLET | Freq: Every day | ORAL | Status: DC
  Administered 2021-06-26 – 2021-07-01 (×6): 10 mg via ORAL
  Filled 2021-06-26 (×6): qty 1

## 2021-06-26 MED ORDER — FLUOROMETHOLONE 0.1 % OP SUSP
1.0000 [drp] | Freq: Four times a day (QID) | OPHTHALMIC | Status: DC
  Administered 2021-06-26 – 2021-06-30 (×13): 1 [drp] via OPHTHALMIC
  Filled 2021-06-26: qty 5

## 2021-06-26 MED ORDER — NETARSUDIL DIMESYLATE 0.02 % OP SOLN: 1.0000 [drp] | Freq: Every day | OPHTHALMIC | Status: DC

## 2021-06-26 MED ORDER — ACETAMINOPHEN 650 MG RE SUPP: 650.0000 mg | RECTAL | Status: DC | PRN

## 2021-06-26 MED ORDER — DORZOLAMIDE HCL 2 % OP SOLN: 1.0000 [drp] | Freq: Two times a day (BID) | OPHTHALMIC | Status: DC

## 2021-06-26 MED ORDER — HALOPERIDOL LACTATE 5 MG/ML IJ SOLN
2.0000 mg | Freq: Four times a day (QID) | INTRAMUSCULAR | Status: DC | PRN
  Administered 2021-06-30: 2 mg via INTRAMUSCULAR
  Filled 2021-06-26 (×2): qty 1

## 2021-06-26 MED ORDER — ACETAMINOPHEN 325 MG PO TABS
650.0000 mg | ORAL_TABLET | ORAL | Status: DC | PRN
  Administered 2021-06-30: 650 mg via ORAL
  Filled 2021-06-26: qty 2

## 2021-06-26 MED ORDER — POTASSIUM CHLORIDE CRYS ER 20 MEQ PO TBCR
40.0000 meq | EXTENDED_RELEASE_TABLET | ORAL | Status: AC
  Administered 2021-06-26 (×2): 40 meq via ORAL
  Filled 2021-06-26: qty 4
  Filled 2021-06-26: qty 2

## 2021-06-26 MED ORDER — FLUOROMETHOLONE 0.1 % OP SUSP: 1.0000 [drp] | Freq: Four times a day (QID) | OPHTHALMIC | Status: DC

## 2021-06-26 MED ORDER — LATANOPROST 0.005 % OP SOLN: 1.0000 [drp] | Freq: Every day | OPHTHALMIC | Status: DC

## 2021-06-26 MED ORDER — BRIMONIDINE TARTRATE 0.2 % OP SOLN
1.0000 [drp] | Freq: Two times a day (BID) | OPHTHALMIC | Status: DC
  Administered 2021-06-26 – 2021-06-30 (×9): 1 [drp] via OPHTHALMIC
  Filled 2021-06-26 (×2): qty 5

## 2021-06-26 MED ORDER — STROKE: EARLY STAGES OF RECOVERY BOOK
Freq: Once | Status: AC
  Filled 2021-06-26: qty 1

## 2021-06-26 MED ORDER — LATANOPROST 0.005 % OP SOLN
1.0000 [drp] | Freq: Every day | OPHTHALMIC | Status: DC
  Administered 2021-06-26 – 2021-06-30 (×5): 1 [drp] via OPHTHALMIC
  Filled 2021-06-26: qty 2.5

## 2021-06-26 MED ORDER — AMLODIPINE BESYLATE 5 MG PO TABS
5.0000 mg | ORAL_TABLET | Freq: Every day | ORAL | Status: DC
  Administered 2021-06-26 – 2021-07-01 (×6): 5 mg via ORAL
  Filled 2021-06-26 (×6): qty 1

## 2021-06-26 MED ORDER — ACETAMINOPHEN 160 MG/5ML PO SOLN: 650.0000 mg | ORAL | Status: DC | PRN

## 2021-06-26 MED ORDER — ESCITALOPRAM OXALATE 10 MG PO TABS
20.0000 mg | ORAL_TABLET | Freq: Every day | ORAL | Status: DC
  Administered 2021-06-26 – 2021-07-01 (×6): 20 mg via ORAL
  Filled 2021-06-26 (×6): qty 2

## 2021-06-26 MED ORDER — CLOPIDOGREL BISULFATE 75 MG PO TABS
75.0000 mg | ORAL_TABLET | Freq: Every day | ORAL | Status: DC
  Administered 2021-06-27 – 2021-07-01 (×4): 75 mg via ORAL
  Filled 2021-06-26 (×4): qty 1

## 2021-06-26 MED ORDER — ASPIRIN EC 81 MG PO TBEC
81.0000 mg | DELAYED_RELEASE_TABLET | Freq: Every day | ORAL | Status: DC
  Administered 2021-06-26 – 2021-07-01 (×6): 81 mg via ORAL
  Filled 2021-06-26 (×6): qty 1

## 2021-06-26 MED ORDER — ASPIRIN 81 MG PO CHEW
324.0000 mg | CHEWABLE_TABLET | Freq: Once | ORAL | Status: AC
  Administered 2021-06-26: 324 mg via ORAL
  Filled 2021-06-26: qty 4

## 2021-06-26 MED ORDER — HEPARIN SODIUM (PORCINE) 5000 UNIT/ML IJ SOLN
5000.0000 [IU] | Freq: Three times a day (TID) | INTRAMUSCULAR | Status: DC
  Administered 2021-06-26 – 2021-06-27 (×4): 5000 [IU] via SUBCUTANEOUS
  Filled 2021-06-26 (×4): qty 1

## 2021-06-26 NOTE — Plan of Care (Signed)
  Problem: Acute Rehab PT Goals(only PT should resolve) Goal: Pt Will Go Supine/Side To Sit Outcome: Progressing Flowsheets (Taken 06/26/2021 0951) Pt will go Supine/Side to Sit:  with min guard assist  with minimal assist Goal: Patient Will Transfer Sit To/From Stand Outcome: Progressing Flowsheets (Taken 06/26/2021 0951) Patient will transfer sit to/from stand:  with min guard assist  with minimal assist Goal: Pt Will Transfer Bed To Chair/Chair To Bed Outcome: Progressing Flowsheets (Taken 06/26/2021 0951) Pt will Transfer Bed to Chair/Chair to Bed:  min guard assist  with min assist Goal: Pt Will Ambulate Outcome: Progressing Flowsheets (Taken 06/26/2021 0951) Pt will Ambulate:  50 feet  with minimal assist  with min guard assist  with rolling walker   9:51 AM, 06/26/21 Lonell Grandchild, MPT Physical Therapist with [facility] 336 [phone] office [phone] mobile phone

## 2021-06-26 NOTE — H&P (Signed)
TRH H&P    Patient Demographics:    Mckenzie [name], is a 75 y.o. female  MRN: [id number]  DOB - 1946-09-01  Admit Date - 06/25/2021  Referring MD/NP/PA: [place]  Outpatient Primary MD for the patient is Loman Brooklyn, FNP  Patient coming from: Home  Chief complaint-confusion, slurred speech, right arm weakness   HPI:    Mckenzie [name]  is a 75 y.o. female, with history of depression, high cholesterol, hypertension, temporal arteritis, polymyalgia, stroke, and more presents the ED with a chief complaint of slurred speech, confusion, right arm weakness.  Husband is at bedside and provides most of the history because patient is confused about the event and still remains mildly confused now.  He reports that she was unsteady when he was putting her to bed around 9 PM, she could not raise her right hand, and she had slurred speech.  He reports that she was confused and was talking about calling people who are deceased for some time.  She does have a history of 2 strokes, and he thought maybe she is having another stroke.  Her symptoms lasted approximately 30 minutes, by the time EMS got there she was mostly back to baseline.  Her right arm strength was normal again, her speech was normal again.  Patient does still remain slightly confused.  She reports that she had surgery on her eyes couple days ago, husband is there telling her that that did not happen and that she is confused.  She was post to see rheumatologist today.  There was some concern that she was having withdrawal from decreasing her prednisone too quickly.  They were trying to wean her off and they recently decreased her in a stepwise approach from 50 mg to 7-1/2 mg.  Patient did have nausea and vomiting yesterday.  She was refusing to eat or drink.  She has been fatigued.  She is complaining of aching all over to her husband, but she tells me she is not  hurting at all.  He reports that she is still hypersensitive and even when he checks blood pressure she screams in pain.  That has been going on since January when she was diagnosed with polymyalgia.  Patient is able to spell her name and the year, but she thinks she is in the buy right.  At baseline, patient is able to hold a normal conversation, requires help with ADLs, and uses a walker for ambulation.  Patient does not smoke, does not drink, does not use illicit drugs.  Patient is vaccinated for COVID.  In the ED Temp 98.8, heart rate 67-73, respiratory rate 16-19, blood pressure 192/73, 95% 5 blood cell count 8.8, hemoglobin 13.6, platelets 237 Chemistry reveals a hypokalemia Alk phos 184 Troponin 11 Thyroid 1.907 CT head shows no acute intracranial abnormality.  Chronic infarcts and generalized cerebral atrophy are noted Chest x-ray shows no active disease EKG shows a heart rate of 76, QTc 467 Admission requested for TIA    Review of systems:    Unfortunately patient remains confused and  is not able to provide reliable review of systems    Past History of the following :    Past Medical History:  Diagnosis Date   Depression    High cholesterol    Hypertension    Retinal micro-aneurysm of right eye       Past Surgical History:  Procedure Laterality Date   ARTERY BIOPSY Bilateral 12/15/2020   Procedure: BILATERAL TEMPORAL ARTERY BIOPSIES;  Surgeon: Angelia Mould, MD;  Location: [facility] OR;  Service: Vascular;  Laterality: Bilateral;   COLONOSCOPY WITH PROPOFOL N/A 12/09/2020   Procedure: COLONOSCOPY WITH PROPOFOL;  Surgeon: Gatha Mayer, MD;  Location: [facility] ENDOSCOPY;  Service: Endoscopy;  Laterality: N/A;   ESOPHAGOGASTRODUODENOSCOPY (EGD) WITH PROPOFOL N/A 12/09/2020   Procedure: ESOPHAGOGASTRODUODENOSCOPY (EGD) WITH PROPOFOL;  Surgeon: Gatha Mayer, MD;  Location: [place];  Service: Endoscopy;  Laterality: N/A;   EYE SURGERY     FINGER SURGERY Left    RING  FINGER   IR THORACENTESIS ASP PLEURAL SPACE W/IMG GUIDE  12/13/2020   OPEN REDUCTION INTERNAL FIXATION (ORIF) DISTAL RADIAL FRACTURE Left 10/05/2020   Procedure: OPEN REDUCTION INTERNAL FIXATION (ORIF) DISTAL RADIAL FRACTURE;  Surgeon: Shona Needles, MD;  Location: [place];  Service: Orthopedics;  Laterality: Left;  supraclavicular block   POLYPECTOMY  12/09/2020   Procedure: POLYPECTOMY;  Surgeon: Gatha Mayer, MD;  Location: [facility] ENDOSCOPY;  Service: Endoscopy;;   TUBAL LIGATION        Social History:      Social History   Tobacco Use   Smoking status: Former    Types: Cigarettes   Smokeless tobacco: Never   Tobacco comments:    for a short period  Substance Use Topics   Alcohol use: No       Family History :     Family History  Problem Relation Age of Onset   Hyperlipidemia Mother    Hypertension Mother    Diabetes Mother    COPD Father    Depression Sister    Suicidality Brother    Drug abuse Brother    Healthy Daughter    Healthy Daughter    Healthy Daughter    Colon cancer Neg Hx    Celiac disease Neg Hx    Inflammatory bowel disease Neg Hx       Home Medications:   Prior to Admission medications   Medication Sig Start Date End Date Taking? Authorizing Provider  acetaminophen (TYLENOL) 325 MG tablet Take 2 tablets (650 mg total) by mouth every 6 (six) hours as needed for mild pain (or Fever >/= 101). 05/27/21   Jennye Boroughs, MD  aspirin 81 MG EC tablet TAKE 1 TABLET (81 MG TOTAL) BY MOUTH DAILY. SWALLOW WHOLE. 12/19/20 12/19/21  Dessa Phi, DO  COMBIGAN 0.2-0.5 % ophthalmic solution Place 1 drop into both eyes every 12 (twelve) hours. 12/01/13   [provider]  dorzolamide (TRUSOPT) 2 % ophthalmic solution Place 1 drop into both eyes 2 (two) times daily.    [provider]  Ergocalciferol (VITAMIN D2) 50 MCG (2000 UT) TABS Take 1 tablet by mouth daily. 04/30/21   Sater, Nanine Means, MD  escitalopram (LEXAPRO) 20 MG tablet Take 1 tablet (20  mg total) by mouth daily. 01/18/21   Loman Brooklyn, FNP  ferrous sulfate 325 (65 FE) MG tablet Take 325 mg by mouth every other day.    [provider]  fluorometholone (FML) 0.1 % ophthalmic suspension Place 1 drop into the right eye  2 (two) times daily.    [provider]  latanoprost (XALATAN) 0.005 % ophthalmic solution Place 1 drop into both eyes at bedtime. 12/29/15   [provider]  lisinopril (ZESTRIL) 10 MG tablet Take 1 tablet (10 mg total) by mouth daily. 06/22/21   Loman Brooklyn, FNP  MAGNESIUM PO Take by mouth.    [provider]  Multiple Vitamin (MULTIVITAMIN) tablet Take 1 tablet by mouth daily.    [provider]  Netarsudil Dimesylate (RHOPRESSA) 0.02 % SOLN Place 1 drop into both eyes at bedtime.    [provider]  predniSONE (DELTASONE) 5 MG tablet Take 2 tablets (10 mg) daily Patient taking differently: 7.5 mg. Take 2 tablets (10 mg) daily 05/27/21   Jennye Boroughs, MD  PROLENSA 0.07 % SOLN Place 1 drop into the right eye daily. 12/14/13   [provider]  sodium chloride 1 g tablet Take 1 g by mouth 2 (two) times daily with a meal.    [provider]  Tocilizumab (ACTEMRA ACTPEN) 162 MG/0.9ML SOAJ Inject 162 mg into the skin once a week. 06/04/21   Rice, Resa Miner, MD  tolterodine (DETROL LA) 2 MG 24 hr capsule Take 1 capsule (2 mg total) by mouth daily. 06/14/21   Loman Brooklyn, FNP     Allergies:     Allergies  Allergen Reactions   Aldomet [Methyldopa] Other (See Comments)    flu   Fosamax [Alendronate Sodium]    Acetazolamide Rash   Buspar [Buspirone] Palpitations     Physical Exam:   Vitals  Blood pressure (!) 200/78, pulse 80, temperature 98.8 F (37.1 C), resp. rate 19, height '5\' 5"'  (1.651 m), weight 59 kg, SpO2 94 %.  1.  General: Patient lying supine in bed,  no acute distress   2. Psychiatric: Alert and oriented x 3, mood and behavior normal for situation, pleasant and  cooperative with exam   3. Neurologic: Speech and language are normal, face is symmetric, moves all 4 extremities voluntarily, normal finger-to-nose, no nystagmus, equal strength upper and lower extremities bilaterally, follows commands, oriented to person and time, but not to place   4. HEENMT:  Head is atraumatic, normocephalic, pupils reactive to light, neck is supple, trachea is midline, mucous membranes are moist   5. Respiratory : Lungs are clear to auscultation bilaterally without wheezing, rhonchi, rales, no cyanosis, no increase in work of breathing or accessory muscle use   6. Cardiovascular : Heart rate normal, rhythm is regular, no murmurs, rubs or gallops, no peripheral edema, peripheral pulses palpated   7. Gastrointestinal:  Abdomen is soft, nondistended, nontender to palpation bowel sounds active, no masses or organomegaly palpated   8. Skin:  Skin is warm, dry and intact without rashes, acute lesions, or ulcers on limited exam   9.Musculoskeletal:  No acute deformities or trauma, no asymmetry in tone, no peripheral edema, peripheral pulses palpated, no tenderness to palpation in the extremities     Data Review:    CBC Recent Labs  Lab 06/25/21 2250  WBC 8.8  HGB 13.6  HCT 41.0  PLT 237  MCV 93.2  MCH 30.9  MCHC 33.2  RDW 13.2  LYMPHSABS 2.1  MONOABS 0.9  EOSABS 0.1  BASOSABS 0.0   ------------------------------------------------------------------------------------------------------------------  Results for orders placed or performed during the hospital encounter of 06/25/21 (from the past 48 hour(s))  CBC with Differential/Platelet     Status: None   Collection Time: 06/25/21 10:50 PM  Result  Value Ref Range   WBC 8.8 4.0 - 10.5 K/uL   RBC 4.40 3.87 - 5.11 MIL/uL   Hemoglobin 13.6 12.0 - 15.0 g/dL   HCT 41.0 36.0 - 46.0 %   MCV 93.2 80.0 - 100.0 fL   MCH 30.9 26.0 - 34.0 pg   MCHC 33.2 30.0 - 36.0 g/dL   RDW 13.2 11.5 - 15.5 %   Platelets 237  150 - 400 K/uL   nRBC 0.0 0.0 - 0.2 %   Neutrophils Relative % 65 %   Neutro Abs 5.7 1.7 - 7.7 K/uL   Lymphocytes Relative 24 %   Lymphs Abs 2.1 0.7 - 4.0 K/uL   Monocytes Relative 10 %   Monocytes Absolute 0.9 0.1 - 1.0 K/uL   Eosinophils Relative 1 %   Eosinophils Absolute 0.1 0.0 - 0.5 K/uL   Basophils Relative 0 %   Basophils Absolute 0.0 0.0 - 0.1 K/uL   Immature Granulocytes 0 %   Abs Immature Granulocytes 0.02 0.00 - 0.07 K/uL    Comment: Performed at [facility], [address] [place], [place] [address]  Comprehensive metabolic panel     Status: Abnormal   Collection Time: 06/25/21 10:50 PM  Result Value Ref Range   Sodium 135 135 - 145 mmol/L   Potassium 3.0 (L) 3.5 - 5.1 mmol/L   Chloride 101 98 - 111 mmol/L   CO2 28 22 - 32 mmol/L   Glucose, Bld 110 (H) 70 - 99 mg/dL    Comment: Glucose reference range applies only to samples taken after fasting for at least 8 hours.   BUN 20 8 - 23 mg/dL   Creatinine, Ser 0.62 0.44 - 1.00 mg/dL   Calcium 8.8 (L) 8.9 - 10.3 mg/dL   Total Protein 6.3 (L) 6.5 - 8.1 g/dL   Albumin 3.8 3.5 - 5.0 g/dL   AST 18 15 - 41 U/L   ALT 21 0 - 44 U/L   Alkaline Phosphatase 184 (H) 38 - 126 U/L   Total Bilirubin 0.8 0.3 - 1.2 mg/dL   GFR, Estimated >60 >60 mL/min    Comment: (NOTE) Calculated using the CKD-EPI Creatinine Equation (2021)    Anion gap 6 5 - 15    Comment: Performed at [facility], [address], [place], [place] [address]  Troponin I (High Sensitivity)     Status: None   Collection Time: 06/25/21 10:50 PM  Result Value Ref Range   Troponin I (High Sensitivity) 11 <18 ng/L    Comment: (NOTE) Elevated high sensitivity troponin I (hsTnI) values and significant  changes across serial measurements may suggest ACS but many other  chronic and acute conditions are known to elevate hsTnI results.  Refer to the "Links" section for chest pain algorithms and additional  guidance. Performed at [facility], [address],  [place], [place] [address]   Ammonia     Status: None   Collection Time: 06/25/21 10:50 PM  Result Value Ref Range   Ammonia 9 9 - 35 umol/L    Comment: Performed at [facility], [address] [place], [place] [address]  TSH     Status: None   Collection Time: 06/25/21 10:50 PM  Result Value Ref Range   TSH 1.907 0.350 - 4.500 uIU/mL    Comment: Performed by a 3rd Generation assay with a functional sensitivity of <=0.01 uIU/mL. Performed at [facility], [address] [place],  [address]   Sedimentation rate     Status:  None   Collection Time: 06/25/21 10:50 PM  Result Value Ref Range   Sed Rate 15 0 - 22 mm/hr    Comment: Performed at [facility], [address], [place], [place] [address]  C-reactive protein     Status: None   Collection Time: 06/25/21 10:50 PM  Result Value Ref Range   CRP 0.5 <1.0 mg/dL    Comment: Performed at [facility], [address] [place], [place] [address]  Resp Panel by RT-PCR (Flu A&B, Covid) Nasopharyngeal Swab     Status: None   Collection Time: 06/26/21 12:20 AM   Specimen: Nasopharyngeal Swab; Nasopharyngeal(NP) swabs in vial transport medium  Result Value Ref Range   SARS Coronavirus 2 by RT PCR NEGATIVE NEGATIVE    Comment: (NOTE) SARS-CoV-2 target nucleic acids are NOT DETECTED.  The SARS-CoV-2 RNA is generally detectable in upper respiratory specimens during the acute phase of infection. The lowest concentration of SARS-CoV-2 viral copies this assay can detect is 138 copies/mL. A negative result does not preclude SARS-Cov-2 infection and should not be used as the sole basis for treatment or other patient management decisions. A negative result may occur with  improper specimen collection/handling, submission of specimen other than nasopharyngeal swab, presence of viral mutation(s) within the areas targeted by this assay, and inadequate number of viral copies(<138 copies/mL). A negative result must be combined with clinical  observations, patient history, and epidemiological information. The expected result is Negative.  Fact Sheet for Patients:  EntrepreneurPulse.com.au  Fact Sheet for Healthcare Providers:  IncredibleEmployment.be  This test is no t yet approved or cleared by the [place] FDA and  has been authorized for detection and/or diagnosis of SARS-CoV-2 by FDA under an Emergency Use Authorization (EUA). This EUA will remain  in effect (meaning this test can be used) for the duration of the COVID-19 declaration under Section 564(b)(1) of the Act, 21 U.S.C.section 360bbb-3(b)(1), unless the authorization is terminated  or revoked sooner.       Influenza A by PCR NEGATIVE NEGATIVE   Influenza B by PCR NEGATIVE NEGATIVE    Comment: (NOTE) The Xpert Xpress SARS-CoV-2/FLU/RSV plus assay is intended as an aid in the diagnosis of influenza from Nasopharyngeal swab specimens and should not be used as a sole basis for treatment. Nasal washings and aspirates are unacceptable for Xpert Xpress SARS-CoV-2/FLU/RSV testing.  Fact Sheet for Patients: EntrepreneurPulse.com.au  Fact Sheet for Healthcare Providers: IncredibleEmployment.be  This test is not yet approved or cleared by the [place] FDA and has been authorized for detection and/or diagnosis of SARS-CoV-2 by FDA under an Emergency Use Authorization (EUA). This EUA will remain in effect (meaning this test can be used) for the duration of the COVID-19 declaration under Section 564(b)(1) of the Act, 21 U.S.C. section 360bbb-3(b)(1), unless the authorization is terminated or revoked.  Performed at [facility], [address], [place], [place] [address]   Troponin I (High Sensitivity)     Status: None   Collection Time: 06/26/21  1:13 AM  Result Value Ref Range   Troponin I (High Sensitivity) 10 <18 ng/L    Comment: (NOTE) Elevated high sensitivity troponin I  (hsTnI) values and significant  changes across serial measurements may suggest ACS but many other  chronic and acute conditions are known to elevate hsTnI results.  Refer to the "Links" section for chest pain algorithms and additional  guidance. Performed at [facility], [address], [place], [place] [address]   Urinalysis, Routine w reflex microscopic  Urine, Clean Catch     Status: Abnormal   Collection Time: 06/26/21  2:19 AM  Result Value Ref Range   Color, Urine YELLOW YELLOW   APPearance CLEAR CLEAR   Specific Gravity, Urine 1.018 1.005 - 1.030   pH 5.0 5.0 - 8.0   Glucose, UA NEGATIVE NEGATIVE mg/dL   Hgb urine dipstick NEGATIVE NEGATIVE   Bilirubin Urine NEGATIVE NEGATIVE   Ketones, ur 5 (A) NEGATIVE mg/dL   Protein, ur 30 (A) NEGATIVE mg/dL   Nitrite NEGATIVE NEGATIVE   Leukocytes,Ua NEGATIVE NEGATIVE   RBC / HPF 0-5 0 - 5 RBC/hpf   WBC, UA 0-5 0 - 5 WBC/hpf   Bacteria, UA NONE SEEN NONE SEEN   Squamous Epithelial / LPF 0-5 0 - 5    Comment: Performed at [facility], [address] [place], [place] [address]    Chemistries  Recent Labs  Lab 06/25/21 2250  NA 135  K 3.0*  CL 101  CO2 28  GLUCOSE 110*  BUN 20  CREATININE 0.62  CALCIUM 8.8*  AST 18  ALT 21  ALKPHOS 184*  BILITOT 0.8   ------------------------------------------------------------------------------------------------------------------  ------------------------------------------------------------------------------------------------------------------ GFR: Estimated Creatinine Clearance: 53.8 mL/min (by C-G formula based on SCr of 0.62 mg/dL). Liver Function Tests: Recent Labs  Lab 06/25/21 2250  AST 18  ALT 21  ALKPHOS 184*  BILITOT 0.8  PROT 6.3*  ALBUMIN 3.8   No results for input(s): LIPASE, AMYLASE in the last 168 hours. Recent Labs  Lab 06/25/21 2250  AMMONIA 9   Coagulation Profile: No results for input(s): INR, PROTIME in the last 168 hours. Cardiac Enzymes: No results  for input(s): CKTOTAL, CKMB, CKMBINDEX, TROPONINI in the last 168 hours. BNP (last 3 results) No results for input(s): PROBNP in the last 8760 hours. HbA1C: No results for input(s): HGBA1C in the last 72 hours. CBG: No results for input(s): GLUCAP in the last 168 hours. Lipid Profile: No results for input(s): CHOL, HDL, LDLCALC, TRIG, CHOLHDL, LDLDIRECT in the last 72 hours. Thyroid Function Tests: Recent Labs    06/25/21 2250  TSH 1.907   Anemia Panel: No results for input(s): VITAMINB12, FOLATE, FERRITIN, TIBC, IRON, RETICCTPCT in the last 72 hours.  --------------------------------------------------------------------------------------------------------------- Urine analysis:    Component Value Date/Time   COLORURINE YELLOW 06/26/2021 0219   APPEARANCEUR CLEAR 06/26/2021 0219   APPEARANCEUR Clear 11/21/2020 1644   LABSPEC 1.018 06/26/2021 0219   PHURINE 5.0 06/26/2021 0219   GLUCOSEU NEGATIVE 06/26/2021 0219   HGBUR NEGATIVE 06/26/2021 0219   BILIRUBINUR NEGATIVE 06/26/2021 0219   BILIRUBINUR Negative 11/21/2020 1644   KETONESUR 5 (A) 06/26/2021 0219   PROTEINUR 30 (A) 06/26/2021 0219   UROBILINOGEN negative 05/13/2015 0827   NITRITE NEGATIVE 06/26/2021 0219   LEUKOCYTESUR NEGATIVE 06/26/2021 0219      Imaging Results:    CT HEAD WO CONTRAST (5MM)  Result Date: 06/25/2021 CLINICAL DATA:  Altered mental status. EXAM: CT HEAD WITHOUT CONTRAST TECHNIQUE: Contiguous axial images were obtained from the base of the skull through the vertex without intravenous contrast. COMPARISON:  December 08, 2020 FINDINGS: Brain: There is mild cerebral atrophy with widening of the extra-axial spaces and ventricular dilatation. There are areas of decreased attenuation within the white matter tracts of the supratentorial brain, consistent with microvascular disease changes. Small areas of cortical encephalomalacia, with adjacent chronic white matter low attenuation are seen within the bilateral  cerebellar regions and parasagittal region of the right occipital lobe. Vascular: No hyperdense vessel or unexpected calcification. Skull:  Normal. Negative for fracture or focal lesion. Sinuses/Orbits: No acute finding. Other: None. IMPRESSION: 1. No acute intracranial abnormality. 2. Generalized cerebral atrophy. 3. 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 Electronically Signed   By: Virgina Norfolk M.D.   On: 06/25/2021 23:34   DG Chest Portable 1 View  Result Date: 06/25/2021 CLINICAL DATA:  Altered mental status. EXAM: PORTABLE CHEST 1 VIEW COMPARISON:  Chest radiograph dated 05/23/2021. FINDINGS: Focal consolidation, pleural effusion or pneumothorax mild cardiomegaly. No acute osseous pathology. With degenerative changes of the spine and shoulders. IMPRESSION: No active cardiopulmonary disease. Electronically Signed   By: Anner Crete M.D.   On: 06/25/2021 23:42       Assessment & Plan:    Active Problems:   Essential hypertension   Acute metabolic encephalopathy   Depression   TIA (transient ischemic attack)   Hypokalemia   Temporal arteritis (HCC)   TIA With difficulty with speech, right arm weakness, and confusion CT head shows no acute intracranial abnormality CT stroke was not called as patient already had return to baseline for speech and arm upon arrival MRI in a.m. Echo in the a.m. Permissive hypertension with a goal of 220/110 Consult neuro Monitor on telemetry Hypokalemia 20 mEq potassium given in ED +30 mEq for potassium Potassium today was 3.0 Recheck in the a.m. Acute metabolic encephalopathy Still remains slightly confused from baseline per husband Possible withdrawal from prednisone contributing? -Bump up dose from 7-1/2 to 10 mg MRI in the a.m. TSH 1.907 Continue to monitor Essential hypertension Holding lisinopril in setting of permissive hypertension secondary to TIA Depression Continue Lexapro      DVT Prophylaxis-    Heparin- SCDs   AM Labs Ordered, also please review Full Orders  Family Communication: Admission, patients condition and plan of care including tests being ordered have been discussed with the patient and husband who indicate understanding and agree with the plan and Code Status.  Code Status: Full  Admission status: Observation Time spent in minutes : [place]

## 2021-06-26 NOTE — Evaluation (Signed)
Speech Language Pathology Evaluation Patient Details Name: JANASHIA PARCO MRN: [id number] DOB: 1945/10/12 Today's Date: 06/26/2021 Time: 7017-7939 SLP Time Calculation (min) (ACUTE ONLY): 20 min  Problem List:  Patient Active Problem List   Diagnosis Date Noted   TIA (transient ischemic attack) 06/26/2021   Hypokalemia 06/26/2021   Temporal arteritis ([place]) 06/26/2021   Acute CVA (cerebrovascular accident) ([place]) 06/26/2021   Compression fracture of T1 vertebra ([place]) 05/25/2021   Compression fracture of T4 vertebra ([place]) 05/25/2021   Fracture of sacrum ([place]) 05/25/2021   Fracture of manubrium 05/25/2021   Low back pain 05/23/2021   Depression 01/23/2021   High risk medication use 01/02/2021   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[place]) 12/27/2020   Pericardial effusion 12/27/2020   Abnormal MRI, cervical spine 12/27/2020   Generalized weakness 12/27/2020   Steroid-induced hyperglycemia 12/27/2020   Malnutrition of moderate degree 12/11/2020   Pressure injury of skin 12/09/2020   Benign neoplasm of cecum    Benign neoplasm of descending colon    Hiatal hernia    Hyponatremia 12/08/2020   Thrombocytosis 12/08/2020   Hypoalbuminemia 12/08/2020   Memory loss 12/08/2020   Constipation 12/06/2020   Anemia of chronic disease 12/06/2020   Gait disturbance 11/14/2020   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 11/14/2020   Cognitive change 11/14/2020   Adjustment disorder with mixed anxiety and depressed mood 10/10/2020   Fracture 10/06/2020   Distal radius fracture 03/00/9233   Acute metabolic encephalopathy 00/76/2263   Proximal weakness of extremity 09/26/2020   Myalgia 09/26/2020   Fall at home, subsequent encounter 09/26/2020   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   Essential hypertension 01/11/2014   Vertigo 01/11/2014   Hyperlipidemia 01/11/2014   Past Medical History:  Past Medical History:  Diagnosis Date   Depression    High cholesterol    Hypertension    Retinal micro-aneurysm of right eye     Past Surgical History:  Past Surgical History:  Procedure Laterality Date   ARTERY BIOPSY Bilateral 12/15/2020   Procedure: BILATERAL TEMPORAL ARTERY BIOPSIES;  Surgeon: Angelia Mould, MD;  Location[facility];  Service: Vascular;  Laterality: Bilateral;   COLONOSCOPY WITH PROPOFOL N/A 12/09/2020   Procedure: COLONOSCOPY WITH PROPOFOL;  Surgeon: Gatha Mayer, MD;  Location[facility] ENDOSCOPY;  Service: Endoscopy;  Laterality: N/A;   ESOPHAGOGASTRODUODENOSCOPY (EGD) WITH PROPOFOL N/A 12/09/2020   Procedure: ESOPHAGOGASTRODUODENOSCOPY (EGD) WITH PROPOFOL;  Surgeon: Gatha Mayer, MD;  Location: [place];  Service: Endoscopy;  Laterality: N/A;   EYE SURGERY     FINGER SURGERY Left    RING FINGER   IR THORACENTESIS ASP PLEURAL SPACE W/IMG GUIDE  12/13/2020   OPEN REDUCTION INTERNAL FIXATION (ORIF) DISTAL RADIAL FRACTURE Left 10/05/2020   Procedure: OPEN REDUCTION INTERNAL FIXATION (ORIF) DISTAL RADIAL FRACTURE;  Surgeon: Shona Needles, MD;  Location: [facility];  Service: Orthopedics;  Laterality: Left;  supraclavicular block   POLYPECTOMY  12/09/2020   Procedure: POLYPECTOMY;  Surgeon: Gatha Mayer, MD;  Location: [facility] ENDOSCOPY;  Service: Endoscopy;;   TUBAL LIGATION     HPI:  Mckenzie [name]  is a 75 y.o. female, with history of depression, high cholesterol, hypertension, temporal arteritis, polymyalgia, stroke, and more presents the ED with a chief complaint of slurred speech, confusion, right arm weakness.  Husband is at bedside and provides most of the history because patient is confused about the event and still remains mildly confused now.  He reports that she was unsteady when he was putting her to bed around 9 PM, she could  not raise her right hand, and she had slurred speech.  He reports that she was confused and was talking about calling people who are deceased for some time.  She does have a history of 2 strokes, and he thought maybe she is having another stroke.  Her symptoms lasted  approximately 30 minutes, by the time EMS got there she was mostly back to baseline.  Her right arm strength was normal again, her speech was normal again.  Patient does still remain slightly confused. At baseline, patient is able to hold a normal conversation, requires help with ADLs, and uses a walker for ambulation. MRI reveals: "Punctate acute infarct inferior left frontal lobe." SLE requested.   Assessment / Plan / Recommendation Clinical Impression  Speech language evaluation completed while Pt was sitting upright in bed with husband at bedside. Pt has baseline cognitive deficits, which per husband have been present since her first stroke. From onset of symptoms, Pt's speech was "mumbled" and "difficult to understand"; those speech changes have resolved. Husband reports Pt's cognition is close to baseline, maybe slightly worse than normal. She was assessed via the [facility] and achieved a score of 9/30 which indicates severe cognitive impairment. Areas of need include: memory, problem solving and thought organization. Pt is aware that she has defiits and was open to support and cues. Note her clock drawing consisted of all incorrect numbers rather than writing numbers 1-12 she wrote increments of 15 up to 60 and the hands were placed around the edges rather than orienting from the center of the circle. Recall of objects and story were about 20% accurate. Recommend [place] ST f/u to provide a more in depth cognitive assessment and treatment as indicated in order to facilitate safety and independence in the home environment. There is no further ST f/u needed in the acute setting. ST will sign off at this time.    SLP Assessment  SLP Recommendation/Assessment: All further Speech Lanaguage Pathology  needs can be addressed in the next venue of care SLP Visit Diagnosis: Cognitive communication deficit (R41.841)    Recommendations for follow up therapy are one component of a multi-disciplinary discharge  planning process, led by the attending physician.  Recommendations may be updated based on patient status, additional functional criteria and insurance authorization.    Follow Up Recommendations  Home health SLP    Frequency and Duration           SLP Evaluation Cognition  Overall Cognitive Status: History of cognitive impairments - at baseline Arousal/Alertness: Awake/alert Orientation Level: Oriented to person;Oriented to place Year: 2022 Day of Week: Incorrect Attention: Focused Focused Attention: Appears intact Memory: Impaired Memory Impairment: Decreased short term memory Awareness: Appears intact Problem Solving: Impaired Executive Function: Reasoning Reasoning: Impaired Reasoning Impairment: Verbal basic       Comprehension  Auditory Comprehension Overall Auditory Comprehension: Appears within functional limits for tasks assessed    Expression Expression Primary Mode of Expression: Verbal Verbal Expression Overall Verbal Expression: Appears within functional limits for tasks assessed Written Expression Dominant Hand: Right   Oral / Motor  Oral Motor/Sensory Function Overall Oral Motor/Sensory Function: Within functional limits Motor Speech Overall Motor Speech: Appears within functional limits for tasks assessed     Kaira Stringfield H. Roddie Mc, CCC-SLP Speech Language Pathologist         Wende Bushy 06/26/2021, 1:40 PM

## 2021-06-26 NOTE — Progress Notes (Addendum)
PROGRESS NOTE    Mckenzie [name]  WUJ:[id number] DOB: 09-Jan-1946 DOA: 06/25/2021 PCP: Loman Brooklyn, FNP   Brief Narrative:  75 year old with history of depression, HLD, HTN, temporal arteritis, polymyalgia, stroke came to the ED with complaints of slurred speech, confusion and right arm weakness.  The symptoms lasted for about 30 minutes.  Upon admission patient had CT of the head done which was unremarkable.  MRI of the brain was positive for acute CVA, neurology team was consulted as well.  No large vessel occlusion were noted.   Assessment & Plan:   Active Problems:   Essential hypertension   Acute metabolic encephalopathy   Depression   TIA (transient ischemic attack)   Hypokalemia   Temporal arteritis (HCC)  Acute CVA, inferior left frontal lobe Acute encephalopathy likely following CVA with some delirium -Allow for permissive hypertension for the first 24-48h - only treat PRN if SBP >220 mmHg. Blood pressures can be gradually normalized to SBP<140 upon discharge.  Patient is still confused/delirious. -MRI brain without contrast and MRA head and neck without contrast is positive for acute CVA but negative for any acute LVO -Maintain E[place]mia.  -Continue aspirin, likely Plavix will need to be added.  Will defer to neurology -Echocardiogram-pending -LDL 188.  TSH normal, A1c 5.2 -Frequent neuro checks -Atorvastatin PO within 24 hrs.  -Risk factor modification -Consult Neurology -UA and Ammonia - neg  Hypokalemia - Repletion  Essential hypertension, uncontrolled. SBP >230 - Resume home lisinopril.  Add Norvasc 5 mg daily.  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  Follows with rheumatology, family and patient seems to the Actemra is driving her blood pressure significantly high  Depression - Lexapro    DVT prophylaxis: heparin injection 5,000 Units Start: 06/26/21 0145 SCD's Start: 06/26/21 0138 Code  Status: Full Family Communication:  Husband at bedside    Dispo: The patient is from: Home              Anticipated d/c is to: Home              Patient currently is not medically stable to d/c.  Patient is still confused and becomes concerning especially she has frontal lobe stroke   Difficult to place patient No        Subjective: Patient is pleasantly confused.  No focal neurodeficits.  Husband is present at bedside and tells me this is not her baseline.  He is concerned that her outpatient polymyalgia rheumatica medication is causing uncontrolled blood pressure  Review of Systems Otherwise negative except as per HPI, including: General: Denies fever, chills, night sweats or unintended weight loss. Resp: Denies cough, wheezing, shortness of breath. Cardiac: Denies chest pain, palpitations, orthopnea, paroxysmal nocturnal dyspnea. GI: Denies abdominal pain, nausea, vomiting, diarrhea or constipation GU: Denies dysuria, frequency, hesitancy or incontinence MS: Denies muscle aches, joint pain or swelling Neuro: Denies headache, neurologic deficits (focal weakness, numbness, tingling), abnormal gait Psych: Denies anxiety, depression, SI/HI/AVH Skin: Denies new rashes or lesions ID: Denies sick contacts, exotic exposures, travel  Examination:  General exam: Appears calm and comfortable, elderly frail Respiratory system: Clear to auscultation. Respiratory effort normal. Cardiovascular system: S1 & S2 heard, RRR. No JVD, murmurs, rubs, gallops or clicks. No pedal edema. Gastrointestinal system: Abdomen is nondistended, soft and nontender. No organomegaly or masses felt. Normal bowel sounds heard. Central nervous system: Alert to name only, no focal neurodeficits Extremities: Symmetric 5 x 5 power. Skin: No rashes, lesions  or ulcers Psychiatry: Poor judgment and insight    Objective: Vitals:   06/26/21 0500 06/26/21 0530 06/26/21 0600 06/26/21 0630  BP: (!) 204/90 (!)  203/87 (!) 199/92 (!) 152/106  Pulse: 75   80  Resp: 16 (!) 21 13 18   Temp:      TempSrc:      SpO2: 93%   99%  Weight:      Height:        Intake/Output Summary (Last 24 hours) at 06/26/2021 1122 Last data filed at 06/26/2021 0228 Gross per 24 hour  Intake 64.76 ml  Output --  Net 64.76 ml   Filed Weights   06/25/21 2244  Weight: 59 kg     Data Reviewed:   CBC: Recent Labs  Lab 06/25/21 2250 06/26/21 0427  WBC 8.8 7.7  NEUTROABS 5.7  --   HGB 13.6 13.2  HCT 41.0 39.8  MCV 93.2 92.8  PLT 237 621   Basic Metabolic Panel: Recent Labs  Lab 06/25/21 2250 06/26/21 0427  NA 135 136  K 3.0* 3.0*  CL 101 101  CO2 28 29  GLUCOSE 110* 105*  BUN 20 16  CREATININE 0.62 0.55  CALCIUM 8.8* 8.6*  MG  --  2.3   GFR: Estimated Creatinine Clearance: 53.8 mL/min (by C-G formula based on SCr of 0.55 mg/dL). Liver Function Tests: Recent Labs  Lab 06/25/21 2250 06/26/21 0427  AST 18 18  ALT 21 20  ALKPHOS 184* 172*  BILITOT 0.8 0.8  PROT 6.3* 5.7*  ALBUMIN 3.8 3.4*   No results for input(s): LIPASE, AMYLASE in the last 168 hours. Recent Labs  Lab 06/25/21 2250  AMMONIA 9   Coagulation Profile: No results for input(s): INR, PROTIME in the last 168 hours. Cardiac Enzymes: No results for input(s): CKTOTAL, CKMB, CKMBINDEX, TROPONINI in the last 168 hours. BNP (last 3 results) No results for input(s): PROBNP in the last 8760 hours. HbA1C: Recent Labs    06/26/21 0427  HGBA1C 5.2   CBG: No results for input(s): GLUCAP in the last 168 hours. Lipid Profile: Recent Labs    06/26/21 0427  CHOL 267*  HDL 49  LDLCALC 188*  TRIG 152*  CHOLHDL 5.4   Thyroid Function Tests: Recent Labs    06/25/21 2250  TSH 1.907   Anemia Panel: No results for input(s): VITAMINB12, FOLATE, FERRITIN, TIBC, IRON, RETICCTPCT in the last 72 hours. Sepsis Labs: No results for input(s): PROCALCITON, LATICACIDVEN in the last 168 hours.  Recent Results (from the past 240  hour(s))  Resp Panel by RT-PCR (Flu A&B, Covid) Nasopharyngeal Swab     Status: None   Collection Time: 06/26/21 12:20 AM   Specimen: Nasopharyngeal Swab; Nasopharyngeal(NP) swabs in vial transport medium  Result Value Ref Range Status   SARS Coronavirus 2 by RT PCR NEGATIVE NEGATIVE Final    Comment: (NOTE) SARS-CoV-2 target nucleic acids are NOT DETECTED.  The SARS-CoV-2 RNA is generally detectable in upper respiratory specimens during the acute phase of infection. The lowest concentration of SARS-CoV-2 viral copies this assay can detect is 138 copies/mL. A negative result does not preclude SARS-Cov-2 infection and should not be used as the sole basis for treatment or other patient management decisions. A negative result may occur with  improper specimen collection/handling, submission of specimen other than nasopharyngeal swab, presence of viral mutation(s) within the areas targeted by this assay, and inadequate number of viral copies(<138 copies/mL). A negative result must be combined with clinical observations, patient history,  and epidemiological information. The expected result is Negative.  Fact Sheet for Patients:  EntrepreneurPulse.com.au  Fact Sheet for Healthcare Providers:  IncredibleEmployment.be  This test is no t yet approved or cleared by the [place] FDA and  has been authorized for detection and/or diagnosis of SARS-CoV-2 by FDA under an Emergency Use Authorization (EUA). This EUA will remain  in effect (meaning this test can be used) for the duration of the COVID-19 declaration under Section 564(b)(1) of the Act, 21 U.S.C.section 360bbb-3(b)(1), unless the authorization is terminated  or revoked sooner.       Influenza A by PCR NEGATIVE NEGATIVE Final   Influenza B by PCR NEGATIVE NEGATIVE Final    Comment: (NOTE) The Xpert Xpress SARS-CoV-2/FLU/RSV plus assay is intended as an aid in the diagnosis of influenza from  Nasopharyngeal swab specimens and should not be used as a sole basis for treatment. Nasal washings and aspirates are unacceptable for Xpert Xpress SARS-CoV-2/FLU/RSV testing.  Fact Sheet for Patients: EntrepreneurPulse.com.au  Fact Sheet for Healthcare Providers: IncredibleEmployment.be  This test is not yet approved or cleared by the [place] FDA and has been authorized for detection and/or diagnosis of SARS-CoV-2 by FDA under an Emergency Use Authorization (EUA). This EUA will remain in effect (meaning this test can be used) for the duration of the COVID-19 declaration under Section 564(b)(1) of the Act, 21 U.S.C. section 360bbb-3(b)(1), unless the authorization is terminated or revoked.  Performed at [facility], [address], [place], [place] [address]          Radiology Studies: CT HEAD WO CONTRAST (5MM)  Result Date: 06/25/2021 CLINICAL DATA:  Altered mental status. EXAM: CT HEAD WITHOUT CONTRAST TECHNIQUE: Contiguous axial images were obtained from the base of the skull through the vertex without intravenous contrast. COMPARISON:  December 08, 2020 FINDINGS: Brain: There is mild cerebral atrophy with widening of the extra-axial spaces and ventricular dilatation. There are areas of decreased attenuation within the white matter tracts of the supratentorial brain, consistent with microvascular disease changes. Small areas of cortical encephalomalacia, with adjacent chronic white matter low attenuation are seen within the bilateral cerebellar regions and parasagittal region of the right occipital lobe. Vascular: No hyperdense vessel or unexpected calcification. Skull: Normal. Negative for fracture or focal lesion. Sinuses/Orbits: No acute finding. Other: None. IMPRESSION: 1. No acute intracranial abnormality. 2. Generalized cerebral atrophy. 3. 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 Electronically Signed   By: Virgina Norfolk M.D.   On: 06/25/2021 23:34   MR ANGIO HEAD WO CONTRAST  Result Date: 06/26/2021 CLINICAL DATA:  Neuro deficit, acute, stroke suspected EXAM: MRI HEAD WITHOUT CONTRAST MRA HEAD WITHOUT CONTRAST TECHNIQUE: Multiplanar, multi-echo pulse sequences of the brain and surrounding structures were acquired without intravenous contrast. Angiographic images of the Circle of Willis were acquired using MRA technique without intravenous contrast. COMPARISON:  No pertinent prior exam. FINDINGS: MRI HEAD Brain: Punctate focus of mildly reduced diffusion in the inferior left frontal lobe (series 11, image 16). There is no acute infarction or intracranial hemorrhage. There is no intracranial mass or mass effect. There is no hydrocephalus or extra-axial fluid collection. Chronic bilateral cerebellar infarcts. Chronic right occipital infarct. Patchy and confluent areas of T2 hyperintensity in the supratentorial and pontine white matter are nonspecific but may reflect moderate chronic microvascular ischemic changes. Vascular: Major vessel flow voids at the skull base are preserved. Skull and upper cervical spine: Normal marrow signal is preserved. Sinuses/Orbits: Paranasal sinuses are aerated. Orbits are unremarkable.  Other: Sella is unremarkable.  Mastoid air cells are clear. MRA HEAD Motion artifact present. Intracranial internal carotid arteries are patent with atherosclerotic irregularity. Middle and anterior cerebral arteries are patent. Intracranial vertebral arteries, basilar artery, posterior cerebral arteries are patent. There is no significant stenosis. IMPRESSION: Punctate acute infarct inferior left frontal lobe. Chronic right occipital and bilateral cerebellar infarcts. Moderate chronic microvascular ischemic changes. No proximal intracranial vessel occlusion or significant stenosis. Electronically Signed   By: Macy Mis M.D.   On: 06/26/2021 09:49   MR BRAIN WO CONTRAST  Result Date:  06/26/2021 CLINICAL DATA:  Neuro deficit, acute, stroke suspected EXAM: MRI HEAD WITHOUT CONTRAST MRA HEAD WITHOUT CONTRAST TECHNIQUE: Multiplanar, multi-echo pulse sequences of the brain and surrounding structures were acquired without intravenous contrast. Angiographic images of the Circle of Willis were acquired using MRA technique without intravenous contrast. COMPARISON:  No pertinent prior exam. FINDINGS: MRI HEAD Brain: Punctate focus of mildly reduced diffusion in the inferior left frontal lobe (series 11, image 16). There is no acute infarction or intracranial hemorrhage. There is no intracranial mass or mass effect. There is no hydrocephalus or extra-axial fluid collection. Chronic bilateral cerebellar infarcts. Chronic right occipital infarct. Patchy and confluent areas of T2 hyperintensity in the supratentorial and pontine white matter are nonspecific but may reflect moderate chronic microvascular ischemic changes. Vascular: Major vessel flow voids at the skull base are preserved. Skull and upper cervical spine: Normal marrow signal is preserved. Sinuses/Orbits: Paranasal sinuses are aerated. Orbits are unremarkable. Other: Sella is unremarkable.  Mastoid air cells are clear. MRA HEAD Motion artifact present. Intracranial internal carotid arteries are patent with atherosclerotic irregularity. Middle and anterior cerebral arteries are patent. Intracranial vertebral arteries, basilar artery, posterior cerebral arteries are patent. There is no significant stenosis. IMPRESSION: Punctate acute infarct inferior left frontal lobe. Chronic right occipital and bilateral cerebellar infarcts. Moderate chronic microvascular ischemic changes. No proximal intracranial vessel occlusion or significant stenosis. Electronically Signed   By: Macy Mis M.D.   On: 06/26/2021 09:49   DG Chest Portable 1 View  Result Date: 06/25/2021 CLINICAL DATA:  Altered mental status. EXAM: PORTABLE CHEST 1 VIEW COMPARISON:  Chest  radiograph dated 05/23/2021. FINDINGS: Focal consolidation, pleural effusion or pneumothorax mild cardiomegaly. No acute osseous pathology. With degenerative changes of the spine and shoulders. IMPRESSION: No active cardiopulmonary disease. Electronically Signed   By: Anner Crete M.D.   On: 06/25/2021 23:42        Scheduled Meds:  amLODipine  5 mg Oral Daily   aspirin EC  81 mg Oral Daily   escitalopram  20 mg Oral Daily   heparin  5,000 Units Subcutaneous Q8H   lisinopril  10 mg Oral Daily   potassium chloride  40 mEq Oral Q4H   predniSONE  10 mg Oral Q breakfast   Continuous Infusions:  potassium chloride 10 mEq (06/26/21 1056)     LOS: 0 days   Time spent= 35 mins    Manvi Guilliams Arsenio Loader, MD [facility]  If 7PM-7AM, please contact night-coverage  06/26/2021, 11:22 AM

## 2021-06-26 NOTE — Evaluation (Signed)
Physical Therapy Evaluation Patient Details Name: Mckenzie [name] MRN: [id number] DOB: 07/13/1946 Today's Date: 06/26/2021  History of Present Illness  Mckenzie [name]  is a 75 y.o. female, with history of depression, high cholesterol, hypertension, temporal arteritis, polymyalgia, stroke, and more presents the ED with a chief complaint of slurred speech, confusion, right arm weakness.  Husband is at bedside and provides most of the history because patient is confused about the event and still remains mildly confused now.  He reports that she was unsteady when he was putting her to bed around 9 PM, she could not raise her right hand, and she had slurred speech.  He reports that she was confused and was talking about calling people who are deceased for some time.  She does have a history of 2 strokes, and he thought maybe she is having another stroke.  Her symptoms lasted approximately 30 minutes, by the time EMS got there she was mostly back to baseline.  Her right arm strength was normal again, her speech was normal again.  Patient does still remain slightly confused.  She reports that she had surgery on her eyes couple days ago, husband is there telling her that that did not happen and that she is confused.  She was post to see rheumatologist today.  There was some concern that she was having withdrawal from decreasing her prednisone too quickly.  They were trying to wean her off and they recently decreased her in a stepwise approach from 50 mg to 7-1/2 mg.  Patient did have nausea and vomiting yesterday.  She was refusing to eat or drink.  She has been fatigued.  She is complaining of aching all over to her husband, but she tells me she is not hurting at all.  He reports that she is still hypersensitive and even when he checks blood pressure she screams in pain.  That has been going on since January when she was diagnosed with polymyalgia.  Patient is able to spell her name and the year, but she thinks she  is in the buy right.   Clinical Impression  Patient demonstrates slow labored movement for sitting up at bedside requiring Min assist with fair/good carryover for rolling to side and sitting up from side lying position, able to take a few steps in room without loss of balance, but limited mostly due to c/o fatigue.  Patient put back to bed after therapy with spouse present in room.  Patient will benefit from continued physical therapy in hospital and recommended venue below to increase strength, balance, endurance for safe ADLs and gait.          Recommendations for follow up therapy are one component of a multi-disciplinary discharge planning process, led by the attending physician.  Recommendations may be updated based on patient status, additional functional criteria and insurance authorization.  Follow Up Recommendations Home health PT;Supervision for mobility/OOB;Supervision - Intermittent    Equipment Recommendations  None recommended by PT    Recommendations for Other Services       Precautions / Restrictions Precautions Precautions: Fall Restrictions Weight Bearing Restrictions: No      Mobility  Bed Mobility Overal bed mobility: Needs Assistance Bed Mobility: Rolling;Sidelying to Sit;Sit to Sidelying Rolling: Min guard;Min assist Sidelying to sit: Min assist     Sit to sidelying: Min assist General bed mobility comments: increased time, labored movement    Transfers Overall transfer level: Needs assistance   Transfers: Sit to/from Stand;Stand Pivot Transfers Sit to Stand:  Min assist Stand pivot transfers: Min assist          Ambulation/Gait Ambulation/Gait assistance: Min assist;Mod assist Gait Distance (Feet): 15 Feet Assistive device: Rolling walker (2 wheeled) Gait Pattern/deviations: Decreased step length - right;Decreased step length - left;Decreased stride length Gait velocity: decreased   General Gait Details: slow labored cadence without loss  of balance, limited  mostly due to fatigue  Stairs            Wheelchair Mobility    Modified Rankin (Stroke Patients Only)       Balance Overall balance assessment: Needs assistance Sitting-balance support: Feet supported;No upper extremity supported Sitting balance-Leahy Scale: Fair Sitting balance - Comments: seated at EOB   Standing balance support: During functional activity;Bilateral upper extremity supported Standing balance-Leahy Scale: Poor Standing balance comment: fair/poor using RW                             Pertinent Vitals/Pain Pain Assessment: Faces Faces Pain Scale: Hurts even more Pain Location: back mostly and c/o generalized pain all over Pain Descriptors / Indicators: Sore;Sharp;Discomfort Pain Intervention(s): Limited activity within patient's tolerance;Monitored during session;Repositioned    Home Living Family/patient expects to be discharged to:: Private residence Living Arrangements: Spouse/significant other Available Help at Discharge: Family;Available 24 hours/day Type of Home: House Home Access: Ramped entrance     Home Layout: One level Home Equipment: Shower seat;Grab bars - tub/shower;Bedside commode;Walker - standard;Grab bars - toilet;Toilet riser Additional Comments: Husband ordered and installed a grab bar at tub that is also within reach of toilet to assist with transfers    Prior Function Level of Independence: Needs assistance   Gait / Transfers Assistance Needed: Min assisted for household distances using RW, gait belt with spouse assisting  ADL's / Homemaking Assistance Needed: family assist with all ADLs        Hand Dominance   Dominant Hand: Right    Extremity/Trunk Assessment   Upper Extremity Assessment Upper Extremity Assessment: Defer to OT evaluation    Lower Extremity Assessment Lower Extremity Assessment: Generalized weakness (no significant difference BLE left vs right)    Cervical /  Trunk Assessment Cervical / Trunk Assessment: Normal  Communication   Communication: HOH  Cognition Arousal/Alertness: Awake/alert Behavior During Therapy: WFL for tasks assessed/performed Overall Cognitive Status: Within Functional Limits for tasks assessed                                        General Comments      Exercises     Assessment/Plan    PT Assessment Patient needs continued PT services  PT Problem List Decreased strength;Decreased activity tolerance;Decreased balance;Decreased mobility       PT Treatment Interventions DME instruction;Gait training;Stair training;Functional mobility training;Therapeutic activities;Therapeutic exercise;Balance training;Patient/family education    PT Goals (Current goals can be found in the Care Plan section)  Acute Rehab PT Goals Patient Stated Goal: return home with family to assist PT Goal Formulation: With patient/family Time For Goal Achievement: 06/28/21 Potential to Achieve Goals: Good    Frequency Min 3X/week   Barriers to discharge        Co-evaluation               AM-PAC PT "6 Clicks" Mobility  Outcome Measure Help needed turning from your back to your side while in a flat bed without  using bedrails?: A Little Help needed moving from lying on your back to sitting on the side of a flat bed without using bedrails?: A Lot Help needed moving to and from a bed to a chair (including a wheelchair)?: A Little Help needed standing up from a chair using your arms (e.g., wheelchair or bedside chair)?: A Little Help needed to walk in hospital room?: A Lot Help needed climbing 3-5 steps with a railing? : A Lot 6 Click Score: 15    End of Session   Activity Tolerance: Patient tolerated treatment well;Patient limited by fatigue Patient left: in bed;with call bell/phone within reach;with family/visitor present Nurse Communication: Mobility status PT Visit Diagnosis: Unsteadiness on feet (R26.81);Other  abnormalities of gait and mobility (R26.89);Muscle weakness (generalized) (M62.81)    Time: 4462-8638 PT Time Calculation (min) (ACUTE ONLY): 28 min   Charges:   PT Evaluation $PT Eval Moderate Complexity: 1 Mod PT Treatments $Therapeutic Activity: 23-37 mins        9:49 AM, 06/26/21 Lonell Grandchild, MPT Physical Therapist with [facility] 336 [phone] office [phone] mobile phone

## 2021-06-26 NOTE — Telephone Encounter (Signed)
Thanks

## 2021-06-26 NOTE — Evaluation (Signed)
Occupational Therapy Evaluation Patient Details Name: Mckenzie [name] MRN: [id number] DOB: 1946-08-23 Today's Date: 06/26/2021   History of Present Illness Mckenzie [name]  is a 75 y.o. female, with history of depression, high cholesterol, hypertension, temporal arteritis, polymyalgia, stroke, and more presents the ED with a chief complaint of slurred speech, confusion, right arm weakness.  Husband is at bedside and provides most of the history because patient is confused about the event and still remains mildly confused now.  He reports that she was unsteady when he was putting her to bed around 9 PM, she could not raise her right hand, and she had slurred speech.  He reports that she was confused and was talking about calling people who are deceased for some time.  She does have a history of 2 strokes, and he thought maybe she is having another stroke.  Her symptoms lasted approximately 30 minutes, by the time EMS got there she was mostly back to baseline.  Her right arm strength was normal again, her speech was normal again.  Patient does still remain slightly confused.  She reports that she had surgery on her eyes couple days ago, husband is there telling her that that did not happen and that she is confused.  She was post to see rheumatologist today.  There was some concern that she was having withdrawal from decreasing her prednisone too quickly.  They were trying to wean her off and they recently decreased her in a stepwise approach from 50 mg to 7-1/2 mg.  Patient did have nausea and vomiting yesterday.  She was refusing to eat or drink.  She has been fatigued.  She is complaining of aching all over to her husband, but she tells me she is not hurting at all.  He reports that she is still hypersensitive and even when he checks blood pressure she screams in pain.  That has been going on since January when she was diagnosed with polymyalgia.  Patient is able to spell her name and the year, but she thinks  she is in the buy right.   Clinical Impression   Pt agreeable to OT/PT co-evaluation. Pt demonstrates bed mobility with min G to min A and slow labored movement. Pt required total assist to don socks at bed level. Pt  was able to ambulate within the room with Min A and labored movement using RW. Pt demonstrates weak B UE with inability to hold shoulder flexion against resistance. Pt has poor vision at baseline. Pt left in bed with family present. Recommended that pt be discharged with tub transfer bench. Pt will benefit from continued OT in the hospital and recommended venue below to increase strength, balance, and endurance for safe ADL's.        Recommendations for follow up therapy are one component of a multi-disciplinary discharge planning process, led by the attending physician.  Recommendations may be updated based on patient status, additional functional criteria and insurance authorization.   Follow Up Recommendations  Home health OT;Supervision/Assistance - 24 hour    Equipment Recommendations  Tub/shower bench           Precautions / Restrictions Precautions Precautions: Fall Restrictions Weight Bearing Restrictions: No      Mobility Bed Mobility Overal bed mobility: Needs Assistance Bed Mobility: Rolling;Sidelying to Sit;Sit to Sidelying Rolling: Min guard;Min assist Sidelying to sit: Min assist     Sit to sidelying: Min assist General bed mobility comments: increased time, labored movement    Transfers Overall transfer  level: Needs assistance Equipment used: Rolling walker (2 wheeled) Transfers: Sit to/from [facility] Sit to Stand: Min assist Stand pivot transfers: Min assist            Balance Overall balance assessment: Needs assistance Sitting-balance support: Feet supported;No upper extremity supported Sitting balance-Leahy Scale: Fair Sitting balance - Comments: seated at EOB   Standing balance support: During functional  activity;Bilateral upper extremity supported Standing balance-Leahy Scale: Poor Standing balance comment: fair/poor using RW                           ADL either performed or assessed with clinical judgement   ADL Overall ADL's : Needs assistance/impaired                     Lower Body Dressing: Total assistance;Bed level Lower Body Dressing Details (indicate cue type and reason): assist to don socks at bed level Toilet Transfer: Minimal assistance;RW;Ambulation Toilet Transfer Details (indicate cue type and reason): partially simulated via pt mobility from EOB to walking in room and back to EOB with RW.                 Vision Baseline Vision/History: 3 Glaucoma (Pt has poor vision at baseline) Ability to See in Adequate Light: 2 Moderately impaired Patient Visual Report: No change from baseline Vision Assessment?:  (Pt has impaired vision at basline and reported no change in vision recently.)                Pertinent Vitals/Pain Pain Assessment: Faces Faces Pain Scale: Hurts even more Pain Location: back mostly and c/o generalized pain all over Pain Descriptors / Indicators: Sore;Sharp;Discomfort Pain Intervention(s): Limited activity within patient's tolerance;Monitored during session;Repositioned     Hand Dominance Right   Extremity/Trunk Assessment Upper Extremity Assessment Upper Extremity Assessment: Generalized weakness (WFL A/ROM observed during mobility. Pt unable to resist shoulder flexion via MMT.)   Lower Extremity Assessment Lower Extremity Assessment: Defer to PT evaluation   Cervical / Trunk Assessment Cervical / Trunk Assessment: Normal   Communication Communication Communication: HOH   Cognition Arousal/Alertness: Awake/alert Behavior During Therapy: WFL for tasks assessed/performed Overall Cognitive Status: Within Functional Limits for tasks assessed                                                 Home Living Family/patient expects to be discharged to:: Private residence Living Arrangements: Spouse/significant other Available Help at Discharge: Family;Available 24 hours/day Type of Home: House Home Access: Ramped entrance     Home Layout: One level     Bathroom Shower/Tub: Teacher, early years/pre: Handicapped height Bathroom Accessibility: Yes (Husband reports it being a tight space.)   Home Equipment: Shower seat;Grab bars - tub/shower;Bedside commode;Walker - standard;Grab bars - toilet;Toilet riser   Additional Comments: Husband ordered and installed a grab bar at tub that is also within reach of toilet to assist with transfers      Prior Functioning/Environment Level of Independence: Needs assistance  Gait / Transfers Assistance Needed: Min assisted for household distances using RW, gait belt with spouse assisting ADL's / Homemaking Assistance Needed: family assist with all ADLs            OT Problem List: Decreased strength;Decreased activity tolerance;Impaired balance (sitting and/or standing)  OT Treatment/Interventions: Self-care/ADL training;Therapeutic exercise;Patient/family education;Balance training;Therapeutic activities;Cognitive remediation/compensation;DME and/or AE instruction    OT Goals(Current goals can be found in the care plan section) Acute Rehab OT Goals Patient Stated Goal: return home with family to assist OT Goal Formulation: With patient/family Time For Goal Achievement: 07/10/21 Potential to Achieve Goals: Fair  OT Frequency: Min 2X/week   Barriers to D/C:            Co-evaluation PT/OT/SLP Co-Evaluation/Treatment: Yes Reason for Co-Treatment: To address functional/ADL transfers   OT goals addressed during session: ADL's and self-care      AM-PAC OT "6 Clicks" Daily Activity     Outcome Measure Help from another person eating meals?: A Little Help from another person taking care of personal grooming?: A  Little Help from another person toileting, which includes using toliet, bedpan, or urinal?: A Little Help from another person bathing (including washing, rinsing, drying)?: A Lot Help from another person to put on and taking off regular upper body clothing?: A Little Help from another person to put on and taking off regular lower body clothing?: A Lot 6 Click Score: 16   End of Session Equipment Utilized During Treatment: Rolling walker  Activity Tolerance: Patient tolerated treatment well Patient left: in bed;with call bell/phone within reach;with family/visitor present  OT Visit Diagnosis: Unsteadiness on feet (R26.81);Repeated falls (R29.6);History of falling (Z91.81);Muscle weakness (generalized) (M62.81)                Time: 5697-9480 OT Time Calculation (min): 23 min Charges:  OT General Charges $OT Visit: 1 Visit OT Evaluation $OT Eval Low Complexity: 1 Low  Emmerich Cryer OT, MOT  Larey Seat 06/26/2021, 10:07 AM

## 2021-06-26 NOTE — Plan of Care (Signed)
  Problem: Acute Rehab OT Goals (only OT should resolve) Goal: Pt. Will Perform Grooming Flowsheets (Taken 06/26/2021 1011) Pt Will Perform Grooming:  standing  with min guard assist  with adaptive equipment Goal: Pt. Will Perform Lower Body Bathing Flowsheets (Taken 06/26/2021 1011) Pt Will Perform Lower Body Bathing:  with min guard assist  sitting/lateral leans  with adaptive equipment Goal: Pt. Will Perform Upper Body Dressing Flowsheets (Taken 06/26/2021 1011) Pt Will Perform Upper Body Dressing: with modified independence Goal: Pt. Will Perform Lower Body Dressing Flowsheets (Taken 06/26/2021 1011) Pt Will Perform Lower Body Dressing:  with min guard assist  sitting/lateral leans  with adaptive equipment Goal: Pt. Will Transfer To Toilet Flowsheets (Taken 06/26/2021 1011) Pt Will Transfer to Toilet:  with supervision  squat pivot transfer  bedside commode Goal: Pt/Caregiver Will Perform Home Exercise Program Flowsheets (Taken 06/26/2021 1011) Pt/caregiver will Perform Home Exercise Program:  Increased strength  Both right and left upper extremity  With Supervision  Miqueas Whilden OT, MOT

## 2021-06-26 NOTE — Telephone Encounter (Signed)
Called patient's daughter [name] and scheduled appt for tomorrow 06/27/21 at 3:20 pm.  [name] states her mom was admitted to [facility] last night with possible TIA, but is scheduled to be discharged tomorrow.  [name] states if her mom is not feeling well enough or if they keep her longer, she will call and reschedule.

## 2021-06-26 NOTE — Consult Note (Addendum)
Mckenzie A. [name], MD     [email or web address]          Mckenzie [name] is an 75 y.o. female.   ASSESSMENT/PLAN: Unexplained encephalopathy: The patient does have a small infarct but this is too small to explain the encephalopathy and confusion. Additional workup is therefore warranted. EEG is suggested. The patient is not improved, a spinal tap will also be recommended.  She has been started on a new medication about 3 months ago for polymyalgia rheumatica.  This raises the potential as the etiology but unclear at this time.  The presentation seems relatively acute which would argue against a medication effect however.    The patient presents with the acute disorientation, confusion and nonsensical speech over the last couple of days. Sometimes there appears to be visual hallucinations with the patient talking to people who were not around. The daughter is present at the bedside and the thinks that the confusion could be even the getting worse today. No focal deficits are reported. The patient denies headaches, diplopia dysarthric. She does have some right ocular headache after she gets procedures for this. She is on new medication for polymyalgia rheumatica which is has been started about June of this year. She has tolerated his medication well however. She apparently has had ischemic stroke in the past and is on aspirin for which she has been compliant. The review systems otherwise negative. There are no reports of fevers, rashes, chest being dyspnea or chills.    GENERAL:  She is awake and alert and cooperates with evaluation. She is a little restless.  HEENT:  Neck is supple no trauma noted.  ABDOMEN: soft  EXTREMITIES: No edema   BACK: normal  SKIN:   Several petechial hemorrhage noted in the upper lower extremities especially the legs. Otherwise no rashes noted    MENTAL STATUS:  She is awake and cooperates with evaluation. She thinks sees at her  daughter's home. She is not oriented to time. She is not oriented to why she is in the hospital. She does follow commands and speech is normal.  CRANIAL NERVES: Pupils are equal, round and reactive to light and accomodation; extra ocular movements are full, there is no significant nystagmus; visual fields are full; upper and lower facial muscles are normal in strength and symmetric, there is no flattening of the nasolabial folds; tongue is midline; uvula is midline; shoulder elevation is normal.  MOTOR: Normal tone, bulk and strength; no pronator drift.  COORDINATION: Left finger to nose is normal, right finger to nose is normal, No rest tremor; no intention tremor; no postural tremor; no bradykinesia.  REFLEXES: Deep tendon reflexes are symmetrical and normal.   SENSATION: Normal to pain.       Blood pressure (!) 167/56, pulse 93, temperature 98.2 F (36.8 C), temperature source Oral, resp. rate 18, height 5\' 5"  (1.651 m), weight 59 kg, SpO2 96 %.  Past Medical History:  Diagnosis Date   Depression    High cholesterol    Hypertension    Retinal micro-aneurysm of right eye     Past Surgical History:  Procedure Laterality Date   ARTERY BIOPSY Bilateral 12/15/2020   Procedure: BILATERAL TEMPORAL ARTERY BIOPSIES;  Surgeon: Angelia Mould, MD;  Location: [facility] OR;  Service: Vascular;  Laterality: Bilateral;   COLONOSCOPY WITH PROPOFOL N/A 12/09/2020   Procedure: COLONOSCOPY WITH PROPOFOL;  Surgeon: Gatha Mayer, MD;  Location: [facility] ENDOSCOPY;  Service: Endoscopy;  Laterality: N/A;   ESOPHAGOGASTRODUODENOSCOPY (  EGD) WITH PROPOFOL N/A 12/09/2020   Procedure: ESOPHAGOGASTRODUODENOSCOPY (EGD) WITH PROPOFOL;  Surgeon: Gatha Mayer, MD;  Location: [place];  Service: Endoscopy;  Laterality: N/A;   EYE SURGERY     FINGER SURGERY Left    RING FINGER   IR THORACENTESIS ASP PLEURAL SPACE W/IMG GUIDE  12/13/2020   OPEN REDUCTION INTERNAL FIXATION (ORIF) DISTAL RADIAL FRACTURE Left  10/05/2020   Procedure: OPEN REDUCTION INTERNAL FIXATION (ORIF) DISTAL RADIAL FRACTURE;  Surgeon: Shona Needles, MD;  Location: [place];  Service: Orthopedics;  Laterality: Left;  supraclavicular block   POLYPECTOMY  12/09/2020   Procedure: POLYPECTOMY;  Surgeon: Gatha Mayer, MD;  Location: [facility] ENDOSCOPY;  Service: Endoscopy;;   TUBAL LIGATION      Family History  Problem Relation Age of Onset   Hyperlipidemia Mother    Hypertension Mother    Diabetes Mother    COPD Father    Depression Sister    Suicidality Brother    Drug abuse Brother    Healthy Daughter    Healthy Daughter    Healthy Daughter    Colon cancer Neg Hx    Celiac disease Neg Hx    Inflammatory bowel disease Neg Hx     Social History:  reports that she has quit smoking. Her smoking use included cigarettes. She has never used smokeless tobacco. She reports that she does not drink alcohol and does not use drugs.  Allergies:  Allergies  Allergen Reactions   Aldomet [Methyldopa] Other (See Comments)    flu   Fosamax [Alendronate Sodium]    Acetazolamide Rash   Buspar [Buspirone] Palpitations    Medications: Prior to Admission medications   Medication Sig Start Date End Date Taking? Authorizing Provider  acetaminophen (TYLENOL) 325 MG tablet Take 2 tablets (650 mg total) by mouth every 6 (six) hours as needed for mild pain (or Fever >/= 101). 05/27/21  Yes Jennye Boroughs, MD  aspirin 81 MG EC tablet TAKE 1 TABLET (81 MG TOTAL) BY MOUTH DAILY. SWALLOW WHOLE. 12/19/20 12/19/21 Yes Dessa Phi, DO  COMBIGAN 0.2-0.5 % ophthalmic solution Place 1 drop into both eyes every 12 (twelve) hours. 12/01/13  Yes [provider]  dorzolamide (TRUSOPT) 2 % ophthalmic solution Place 1 drop into both eyes 2 (two) times daily.   Yes [provider]  Ergocalciferol (VITAMIN D2) 50 MCG (2000 UT) TABS Take 1 tablet by mouth daily. 04/30/21  Yes Sater, Nanine Means, MD  escitalopram (LEXAPRO) 20 MG tablet Take 1 tablet  (20 mg total) by mouth daily. 01/18/21  Yes Hendricks Limes F, FNP  ferrous sulfate 325 (65 FE) MG tablet Take 325 mg by mouth every other day.   Yes [provider]  fluorometholone (FML) 0.1 % ophthalmic suspension Place 1 drop into the right eye 4 (four) times daily.   Yes [provider]  furosemide (LASIX) 20 MG tablet Take 10 mg by mouth daily.   Yes [provider]  latanoprost (XALATAN) 0.005 % ophthalmic solution Place 1 drop into both eyes at bedtime. 12/29/15  Yes [provider]  lisinopril (ZESTRIL) 10 MG tablet Take 1 tablet (10 mg total) by mouth daily. 06/22/21  Yes Hendricks Limes F, FNP  MAGNESIUM PO Take 1 tablet by mouth daily.   Yes [provider]  Multiple Vitamin (MULTIVITAMIN) tablet Take 1 tablet by mouth daily.   Yes [provider]  Netarsudil Dimesylate (RHOPRESSA) 0.02 % SOLN Place 1 drop into both eyes at bedtime.  Yes [provider]  predniSONE (DELTASONE) 5 MG tablet Take 2 tablets (10 mg) daily Patient taking differently: 7.5 mg. Take 2 tablets (10 mg) daily 05/27/21  Yes Jennye Boroughs, MD  sodium chloride 1 g tablet Take 1 g by mouth 2 (two) times daily with a meal.   Yes [provider]  Tocilizumab (ACTEMRA ACTPEN) 162 MG/0.9ML SOAJ Inject 162 mg into the skin once a week. 06/04/21   Rice, Resa Miner, MD  tolterodine (DETROL LA) 2 MG 24 hr capsule Take 1 capsule (2 mg total) by mouth daily. Patient not taking: Reported on 06/26/2021 06/14/21   Loman Brooklyn, FNP    Scheduled Meds:  amLODipine  5 mg Oral Daily   aspirin EC  81 mg Oral Daily   atorvastatin  40 mg Oral Daily   brimonidine  1 drop Both Eyes BID   And   timolol  1 drop Both Eyes BID   dorzolamide  1 drop Both Eyes BID   escitalopram  20 mg Oral Daily   fluorometholone  1 drop Right Eye QID   heparin  5,000 Units Subcutaneous Q8H   latanoprost  1 drop Both Eyes QHS   lisinopril  10 mg Oral Daily   Netarsudil Dimesylate   1 drop Both Eyes QHS   predniSONE  10 mg Oral Q breakfast   Continuous Infusions: PRN Meds:.acetaminophen **OR** acetaminophen (TYLENOL) oral liquid 160 mg/5 mL **OR** acetaminophen, haloperidol **OR** haloperidol lactate, hydrALAZINE, ipratropium-albuterol, metoprolol tartrate, senna-docusate, traZODone     Results for orders placed or performed during the hospital encounter of 06/25/21 (from the past 48 hour(s))  CBC with Differential/Platelet     Status: None   Collection Time: 06/25/21 10:50 PM  Result Value Ref Range   WBC 8.8 4.0 - 10.5 K/uL   RBC 4.40 3.87 - 5.11 MIL/uL   Hemoglobin 13.6 12.0 - 15.0 g/dL   HCT 41.0 36.0 - 46.0 %   MCV 93.2 80.0 - 100.0 fL   MCH 30.9 26.0 - 34.0 pg   MCHC 33.2 30.0 - 36.0 g/dL   RDW 13.2 11.5 - 15.5 %   Platelets 237 150 - 400 K/uL   nRBC 0.0 0.0 - 0.2 %   Neutrophils Relative % 65 %   Neutro Abs 5.7 1.7 - 7.7 K/uL   Lymphocytes Relative 24 %   Lymphs Abs 2.1 0.7 - 4.0 K/uL   Monocytes Relative 10 %   Monocytes Absolute 0.9 0.1 - 1.0 K/uL   Eosinophils Relative 1 %   Eosinophils Absolute 0.1 0.0 - 0.5 K/uL   Basophils Relative 0 %   Basophils Absolute 0.0 0.0 - 0.1 K/uL   Immature Granulocytes 0 %   Abs Immature Granulocytes 0.02 0.00 - 0.07 K/uL    Comment: Performed at [facility], [address], [place], [place] [address]  Comprehensive metabolic panel     Status: Abnormal   Collection Time: 06/25/21 10:50 PM  Result Value Ref Range   Sodium 135 135 - 145 mmol/L   Potassium 3.0 (L) 3.5 - 5.1 mmol/L   Chloride 101 98 - 111 mmol/L   CO2 28 22 - 32 mmol/L   Glucose, Bld 110 (H) 70 - 99 mg/dL    Comment: Glucose reference range applies only to samples taken after fasting for at least 8 hours.   BUN 20 8 - 23 mg/dL   Creatinine, Ser 0.62 0.44 - 1.00 mg/dL   Calcium 8.8 (L) 8.9 - 10.3 mg/dL  Total Protein 6.3 (L) 6.5 - 8.1 g/dL   Albumin 3.8 3.5 - 5.0 g/dL   AST 18 15 - 41 U/L   ALT 21 0 - 44 U/L   Alkaline Phosphatase 184 (H)  38 - 126 U/L   Total Bilirubin 0.8 0.3 - 1.2 mg/dL   GFR, Estimated >60 >60 mL/min    Comment: (NOTE) Calculated using the CKD-EPI Creatinine Equation (2021)    Anion gap 6 5 - 15    Comment: Performed at [facility], [address] [place], [place] [address]  Troponin I (High Sensitivity)     Status: None   Collection Time: 06/25/21 10:50 PM  Result Value Ref Range   Troponin I (High Sensitivity) 11 <18 ng/L    Comment: (NOTE) Elevated high sensitivity troponin I (hsTnI) values and significant  changes across serial measurements may suggest ACS but many other  chronic and acute conditions are known to elevate hsTnI results.  Refer to the "Links" section for chest pain algorithms and additional  guidance. Performed at [facility], [address] [place], [place] [address]   Ammonia     Status: None   Collection Time: 06/25/21 10:50 PM  Result Value Ref Range   Ammonia 9 9 - 35 umol/L    Comment: Performed at [facility], [address] [place], [place] [address]  TSH     Status: None   Collection Time: 06/25/21 10:50 PM  Result Value Ref Range   TSH 1.907 0.350 - 4.500 uIU/mL    Comment: Performed by a 3rd Generation assay with a functional sensitivity of <=0.01 uIU/mL. Performed at [facility], [address], [place], [place] [address]   Sedimentation rate     Status: None   Collection Time: 06/25/21 10:50 PM  Result Value Ref Range   Sed Rate 15 0 - 22 mm/hr    Comment: Performed at [facility], [address], [place], [place] [address]  C-reactive protein     Status: None   Collection Time: 06/25/21 10:50 PM  Result Value Ref Range   CRP 0.5 <1.0 mg/dL    Comment: Performed at [facility], [address], [place], [place] [address]  Resp Panel by RT-PCR (Flu A&B, Covid) Nasopharyngeal Swab     Status: None   Collection Time: 06/26/21 12:20 AM   Specimen: Nasopharyngeal Swab; Nasopharyngeal(NP) swabs in vial transport medium  Result Value Ref Range   SARS  Coronavirus 2 by RT PCR NEGATIVE NEGATIVE    Comment: (NOTE) SARS-CoV-2 target nucleic acids are NOT DETECTED.  The SARS-CoV-2 RNA is generally detectable in upper respiratory specimens during the acute phase of infection. The lowest concentration of SARS-CoV-2 viral copies this assay can detect is 138 copies/mL. A negative result does not preclude SARS-Cov-2 infection and should not be used as the sole basis for treatment or other patient management decisions. A negative result may occur with  improper specimen collection/handling, submission of specimen other than nasopharyngeal swab, presence of viral mutation(s) within the areas targeted by this assay, and inadequate number of viral copies(<138 copies/mL). A negative result must be combined with clinical observations, patient history, and epidemiological information. The expected result is Negative.  Fact Sheet for Patients:  EntrepreneurPulse.com.au  Fact Sheet for Healthcare Providers:  IncredibleEmployment.be  This test is no t yet approved or cleared by the [place] FDA and  has been authorized for detection and/or diagnosis of SARS-CoV-2 by FDA under an Emergency Use Authorization (EUA). This EUA will remain  in effect (meaning this test can be used) for the duration of the COVID-19 declaration under Section 564(b)(1) of the Act, 21 U.S.C.section 360bbb-3(b)(1), unless the authorization is terminated  or revoked sooner.       Influenza A by PCR NEGATIVE NEGATIVE   Influenza B by PCR NEGATIVE NEGATIVE    Comment: (NOTE) The Xpert Xpress SARS-CoV-2/FLU/RSV plus assay is intended as an aid in the diagnosis of influenza from Nasopharyngeal swab specimens and should not be used as a sole basis for treatment. Nasal washings and aspirates are unacceptable for Xpert Xpress SARS-CoV-2/FLU/RSV testing.  Fact Sheet for Patients: EntrepreneurPulse.com.au  Fact Sheet  for Healthcare Providers: IncredibleEmployment.be  This test is not yet approved or cleared by the [place] FDA and has been authorized for detection and/or diagnosis of SARS-CoV-2 by FDA under an Emergency Use Authorization (EUA). This EUA will remain in effect (meaning this test can be used) for the duration of the COVID-19 declaration under Section 564(b)(1) of the Act, 21 U.S.C. section 360bbb-3(b)(1), unless the authorization is terminated or revoked.  Performed at [facility], [address] [place], [place] [address]   Troponin I (High Sensitivity)     Status: None   Collection Time: 06/26/21  1:13 AM  Result Value Ref Range   Troponin I (High Sensitivity) 10 <18 ng/L    Comment: (NOTE) Elevated high sensitivity troponin I (hsTnI) values and significant  changes across serial measurements may suggest ACS but many other  chronic and acute conditions are known to elevate hsTnI results.  Refer to the "Links" section for chest pain algorithms and additional  guidance. Performed at [facility], [address], [place], [place] [address]   Urinalysis, Routine w reflex microscopic Urine, Clean Catch     Status: Abnormal   Collection Time: 06/26/21  2:19 AM  Result Value Ref Range   Color, Urine YELLOW YELLOW   APPearance CLEAR CLEAR   Specific Gravity, Urine 1.018 1.005 - 1.030   pH 5.0 5.0 - 8.0   Glucose, UA NEGATIVE NEGATIVE mg/dL   Hgb urine dipstick NEGATIVE NEGATIVE   Bilirubin Urine NEGATIVE NEGATIVE   Ketones, ur 5 (A) NEGATIVE mg/dL   Protein, ur 30 (A) NEGATIVE mg/dL   Nitrite NEGATIVE NEGATIVE   Leukocytes,Ua NEGATIVE NEGATIVE   RBC / HPF 0-5 0 - 5 RBC/hpf   WBC, UA 0-5 0 - 5 WBC/hpf   Bacteria, UA NONE SEEN NONE SEEN   Squamous Epithelial / LPF 0-5 0 - 5    Comment: Performed at [facility], [address], [place], [place] [address]  Hemoglobin A1c     Status: None   Collection Time: 06/26/21  4:27 AM  Result Value Ref Range   Hgb  A1c MFr Bld 5.2 4.8 - 5.6 %    Comment: (NOTE) Pre diabetes:          5.7%-6.4%  Diabetes:              >6.4%  Glycemic control for   <7.0% adults with diabetes    Mean Plasma Glucose 102.54 mg/dL    Comment: Performed at [facility] [address], [place], [place] [address]  Lipid panel     Status: Abnormal   Collection Time: 06/26/21  4:27 AM  Result Value Ref Range   Cholesterol 267 (H) 0 - 200 mg/dL   Triglycerides 152 (H) <150 mg/dL   HDL 49 >40 mg/dL   Total CHOL/HDL Ratio 5.4 RATIO   VLDL 30  0 - 40 mg/dL   LDL Cholesterol 188 (H) 0 - 99 mg/dL    Comment:        Total Cholesterol/HDL:CHD Risk Coronary Heart Disease Risk Table                     Men   Women  1/2 Average Risk   3.4   3.3  Average Risk       5.0   4.4  2 X Average Risk   9.6   7.1  3 X Average Risk  23.4   11.0        Use the calculated Patient Ratio above and the CHD Risk Table to determine the patient's CHD Risk.        ATP III CLASSIFICATION (LDL):  <100     mg/dL   Optimal  100-129  mg/dL   Near or Above                    Optimal  130-159  mg/dL   Borderline  160-189  mg/dL   High  >190     mg/dL   Very High Performed at [facility] [place], [place] [address]   Comprehensive metabolic panel     Status: Abnormal   Collection Time: 06/26/21  4:27 AM  Result Value Ref Range   Sodium 136 135 - 145 mmol/L   Potassium 3.0 (L) 3.5 - 5.1 mmol/L   Chloride 101 98 - 111 mmol/L   CO2 29 22 - 32 mmol/L   Glucose, Bld 105 (H) 70 - 99 mg/dL    Comment: Glucose reference range applies only to samples taken after fasting for at least 8 hours.   BUN 16 8 - 23 mg/dL   Creatinine, Ser 0.55 0.44 - 1.00 mg/dL   Calcium 8.6 (L) 8.9 - 10.3 mg/dL   Total Protein 5.7 (L) 6.5 - 8.1 g/dL   Albumin 3.4 (L) 3.5 - 5.0 g/dL   AST 18 15 - 41 U/L   ALT 20 0 - 44 U/L   Alkaline Phosphatase 172 (H) 38 - 126 U/L   Total Bilirubin 0.8 0.3 - 1.2 mg/dL   GFR, Estimated >60 >60 mL/min     Comment: (NOTE) Calculated using the CKD-EPI Creatinine Equation (2021)    Anion gap 6 5 - 15    Comment: Performed at [facility], [address] [place], [place] [address]  CBC     Status: None   Collection Time: 06/26/21  4:27 AM  Result Value Ref Range   WBC 7.7 4.0 - 10.5 K/uL   RBC 4.29 3.87 - 5.11 MIL/uL   Hemoglobin 13.2 12.0 - 15.0 g/dL   HCT 39.8 36.0 - 46.0 %   MCV 92.8 80.0 - 100.0 fL   MCH 30.8 26.0 - 34.0 pg   MCHC 33.2 30.0 - 36.0 g/dL   RDW 13.1 11.5 - 15.5 %   Platelets 208 150 - 400 K/uL   nRBC 0.0 0.0 - 0.2 %    Comment: Performed at [facility], [address], [place], [place] [address]  Hemoglobin A1c     Status: None   Collection Time: 06/26/21  4:27 AM  Result Value Ref Range   Hgb A1c MFr Bld 5.2 4.8 - 5.6 %    Comment: (NOTE) Pre diabetes:          5.7%-6.4%  Diabetes:              >  6.4%  Glycemic control for   <7.0% adults with diabetes    Mean Plasma Glucose 102.54 mg/dL    Comment: Performed at [facility] [address], [place], [place] [address]  Magnesium     Status: None   Collection Time: 06/26/21  4:27 AM  Result Value Ref Range   Magnesium 2.3 1.7 - 2.4 mg/dL    Comment: Performed at [facility], [address], [place],  [address]    Studies/Results:   BRAIN MRI MRA FINDINGS: MRI HEAD   Brain: Punctate focus of mildly reduced diffusion in the inferior left frontal lobe (series 11, image 16). There is no acute infarction or intracranial hemorrhage. There is no intracranial mass or mass effect. There is no hydrocephalus or extra-axial fluid collection. Chronic bilateral cerebellar infarcts. Chronic right occipital infarct. Patchy and confluent areas of T2 hyperintensity in the supratentorial and pontine white matter are nonspecific but may reflect moderate chronic microvascular ischemic changes.   Vascular: Major vessel flow voids at the skull base are preserved.   Skull and upper cervical spine: Normal  marrow signal is preserved.   Sinuses/Orbits: Paranasal sinuses are aerated. Orbits are unremarkable.   Other: Sella is unremarkable.  Mastoid air cells are clear.   MRA HEAD   Motion artifact present. Intracranial internal carotid arteries are patent with atherosclerotic irregularity. Middle and anterior cerebral arteries are patent. Intracranial vertebral arteries, basilar artery, posterior cerebral arteries are patent. There is no significant stenosis.   IMPRESSION: Punctate acute infarct inferior left frontal lobe.   Chronic right occipital and bilateral cerebellar infarcts. Moderate chronic microvascular ischemic changes.   No proximal intracranial vessel occlusion or significant stenosis.       THE BRAIN MRI IS REVIEWED IN PERSON. There is a tiny infarct involving the left frontal lobe deep white matter. This is faint. There is moderate global atrophy. There is confluent leukoencephalopathy consistent with chronic microvascular ischemic changes. Remote infarcts are noted in the cerebellum with the largest being wedge-shaped involving the inferior cerebellum. There are 2 smaller lesions/infarcts involving more rostral cerebral areas bilaterally.    Ormand Senn A. Merlene [name], M.D.  Diplomate, Tax adviser of Psychiatry and Neurology ( Neurology).   06/26/2021, 7:14 PM

## 2021-06-27 ENCOUNTER — Inpatient Hospital Stay (HOSPITAL_COMMUNITY)
Admit: 2021-06-27 | Discharge: 2021-06-27 | Disposition: A | Discharge status: Home / Self Care | Payer: Medicare Other | Attending: Neurology | Admitting: Neurology

## 2021-06-27 ENCOUNTER — Inpatient Hospital Stay (HOSPITAL_COMMUNITY): Payer: Medicare Other

## 2021-06-27 ENCOUNTER — Ambulatory Visit: Payer: Medicare Other | Admitting: Internal Medicine

## 2021-06-27 DIAGNOSIS — I10 Essential (primary) hypertension: Secondary | ICD-10-CM

## 2021-06-27 DIAGNOSIS — I639 Cerebral infarction, unspecified: Principal | ICD-10-CM

## 2021-06-27 DIAGNOSIS — G459 Transient cerebral ischemic attack, unspecified: Secondary | ICD-10-CM

## 2021-06-27 DIAGNOSIS — F32A Depression, unspecified: Secondary | ICD-10-CM

## 2021-06-27 LAB — BASIC METABOLIC PANEL
Anion gap: 9 (ref 5–15)
BUN: 13 mg/dL (ref 8–23)
CO2: 25 mmol/L (ref 22–32)
Calcium: 9.2 mg/dL (ref 8.9–10.3)
Chloride: 103 mmol/L (ref 98–111)
Creatinine, Ser: 0.4 mg/dL — ABNORMAL LOW (ref 0.44–1.00)
GFR, Estimated: >60 mL/min (ref >60)
Glucose, Bld: 107 mg/dL — ABNORMAL HIGH (ref 70–99)
Potassium: 3.3 mmol/L — ABNORMAL LOW (ref 3.5–5.1)
Sodium: 137 mmol/L (ref 135–145)

## 2021-06-27 LAB — CBC
HCT: 43.5 % (ref 36.0–46.0)
Hemoglobin: 14.1 g/dL (ref 12.0–15.0)
MCH: 30.6 pg (ref 26.0–34.0)
MCHC: 32.4 g/dL (ref 30.0–36.0)
MCV: 94.4 fL (ref 80.0–100.0)
Platelets: 238 10*3/uL (ref 150–400)
RBC: 4.61 MIL/uL (ref 3.87–5.11)
RDW: 13.2 % (ref 11.5–15.5)
WBC: 8.4 10*3/uL (ref 4.0–10.5)
nRBC: 0 % (ref 0.0–0.2)

## 2021-06-27 LAB — ECHOCARDIOGRAM COMPLETE
AR max vel: 1.68 cm2
AV Area VTI: 1.63 cm2
AV Area mean vel: 1.61 cm2
AV Mean grad: 4 mmHg
AV Peak grad: 7.4 mmHg
Ao pk vel: 1.36 m/s
Area-P 1/2: 3.61 cm2
Calc EF: 51.9 %
Height: 65 in
MV VTI: 2.48 cm2
S' Lateral: 2.9 cm
Single Plane A2C EF: 45.7 %
Single Plane A4C EF: 54.7 %
Weight: 2081.14 oz

## 2021-06-27 LAB — VITAMIN B12: Vitamin B-12: 684 pg/mL (ref 180–914)

## 2021-06-27 LAB — MAGNESIUM: Magnesium: 2 mg/dL (ref 1.7–2.4)

## 2021-06-27 MED ORDER — POTASSIUM CHLORIDE CRYS ER 20 MEQ PO TBCR
40.0000 meq | EXTENDED_RELEASE_TABLET | Freq: Once | ORAL | Status: AC
  Administered 2021-06-27: 40 meq via ORAL
  Filled 2021-06-27: qty 2

## 2021-06-27 MED ORDER — KETOROLAC TROMETHAMINE 0.5 % OP SOLN
1.0000 [drp] | Freq: Every day | OPHTHALMIC | Status: DC
  Administered 2021-06-27 – 2021-06-30 (×4): 1 [drp] via OPHTHALMIC
  Filled 2021-06-27: qty 3

## 2021-06-27 NOTE — Progress Notes (Signed)
OT Cancellation Note  Patient Details Name: Mckenzie [name] MRN: [id number] DOB: 08/22/1946   Cancelled Treatment:    Reason Eval/Treat Not Completed: Medical issues which prohibited therapy. Husband reported that treating MD told him that pt needed to rest. Nursing was consulted and reported that pt is not medically appropriate for therapy at this time due to delirium. Will attempt to see pt later as time allows.   Adoni Greenough OT, MOT  Larey Seat 06/27/2021, 9:54 AM

## 2021-06-27 NOTE — Progress Notes (Signed)
PROGRESS NOTE    Mckenzie [name]  YTK:[id number] DOB: Dec 07, 1945 DOA: 06/25/2021 PCP: Loman Brooklyn, FNP   Brief Narrative:  75 year old with history of depression, HLD, HTN, temporal arteritis, polymyalgia, stroke came to the ED with complaints of slurred speech, confusion and right arm weakness.  The symptoms lasted for about 30 minutes.  Upon admission patient had CT of the head done which was unremarkable.  MRI of the brain was positive for acute CVA, neurology team was consulted as well.  No large vessel occlusion were noted.  Neurology recommended obtaining EEG.   Assessment & Plan:   Active Problems:   Essential hypertension   Acute metabolic encephalopathy   Depression   TIA (transient ischemic attack)   Hypokalemia   Temporal arteritis (HCC)   Acute CVA (cerebrovascular accident) ([place])  Acute CVA, inferior left frontal lobe Acute encephalopathy likely following CVA with some delirium -Permissive hypertension.  She is still off-and-on delirious -MRI brain without contrast and MRA head and neck without contrast is positive for acute CVA but negative for any acute LVO -Continue aspirin, defer addition of Plavix to neurology -Echocardiogram-results pending -LDL 188.  TSH normal, A1c 5.2 -Statin -Neurology following - EEG- -UA and Ammonia - neg  Hypokalemia - Repletion  Essential hypertension, uncontrolled. SBP >230 - Continue lisinopril, Norvasc 5 mg daily added.  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  Follows with rheumatology, family and patient seems to the Actemra is driving her blood pressure significantly high  Depression - Lexapro    DVT prophylaxis: heparin injection 5,000 Units Start: 06/26/21 0145 SCD's Start: 06/26/21 0138 Code Status: Full Family Communication: Husband is at bedside   Dispo: The patient is from: Home              Anticipated d/c is to: Home              Patient  currently is not medically stable to d/c.  Patient is still quite confused ongoing work-up per neurology   Difficult to place patient No        Subjective: Overnight patient off-and-on had episodes of confusion.  This morning she is resting comfortably.  Husband is present at bedside.  Review of Systems Otherwise negative except as per HPI, including: General: Denies fever, chills, night sweats or unintended weight loss. Resp: Denies cough, wheezing, shortness of breath. Cardiac: Denies chest pain, palpitations, orthopnea, paroxysmal nocturnal dyspnea. GI: Denies abdominal pain, nausea, vomiting, diarrhea or constipation GU: Denies dysuria, frequency, hesitancy or incontinence MS: Denies muscle aches, joint pain or swelling Neuro: Denies headache, neurologic deficits (focal weakness, numbness, tingling), abnormal gait Psych: Denies anxiety, depression, SI/HI/AVH Skin: Denies new rashes or lesions ID: Denies sick contacts, exotic exposures, travel  Examination:  Constitutional: Not in acute distress Respiratory: Clear to auscultation bilaterally Cardiovascular: Normal sinus rhythm, no rubs Abdomen: Nontender nondistended good bowel sounds Musculoskeletal: No edema noted Skin: No rashes seen Neurologic: grossly moving all extremities. Psychiatric: Poor judgement and insight. Drowsy, but alert only to her name.      Objective: Vitals:   06/26/21 1500 06/26/21 1800 06/26/21 2020 06/27/21 0527  BP: 139/80 (!) 167/56 (!) 153/78 (!) 168/101  Pulse: (!) 107 93 93 87  Resp: 18 18 20 20   Temp: 98.2 F (36.8 C) 98.2 F (36.8 C) 98.3 F (36.8 C) 98.3 F (36.8 C)  TempSrc: Oral Oral Oral   SpO2: 96%  95% 99%  Weight:  Height:        Intake/Output Summary (Last 24 hours) at 06/27/2021 1134 Last data filed at 06/26/2021 1600 Gross per 24 hour  Intake 180.2 ml  Output --  Net 180.2 ml   Filed Weights   06/25/21 2244  Weight: 59 kg     Data Reviewed:    CBC: Recent Labs  Lab 06/25/21 2250 06/26/21 0427 06/27/21 0506  WBC 8.8 7.7 8.4  NEUTROABS 5.7  --   --   HGB 13.6 13.2 14.1  HCT 41.0 39.8 43.5  MCV 93.2 92.8 94.4  PLT 237 208 010   Basic Metabolic Panel: Recent Labs  Lab 06/25/21 2250 06/26/21 0427 06/27/21 0506  NA 135 136 137  K 3.0* 3.0* 3.3*  CL 101 101 103  CO2 28 29 25   GLUCOSE 110* 105* 107*  BUN 20 16 13   CREATININE 0.62 0.55 0.40*  CALCIUM 8.8* 8.6* 9.2  MG  --  2.3 2.0   GFR: Estimated Creatinine Clearance: 53.8 mL/min (A) (by C-G formula based on SCr of 0.4 mg/dL (L)). Liver Function Tests: Recent Labs  Lab 06/25/21 2250 06/26/21 0427  AST 18 18  ALT 21 20  ALKPHOS 184* 172*  BILITOT 0.8 0.8  PROT 6.3* 5.7*  ALBUMIN 3.8 3.4*   No results for input(s): LIPASE, AMYLASE in the last 168 hours. Recent Labs  Lab 06/25/21 2250  AMMONIA 9   Coagulation Profile: No results for input(s): INR, PROTIME in the last 168 hours. Cardiac Enzymes: No results for input(s): CKTOTAL, CKMB, CKMBINDEX, TROPONINI in the last 168 hours. BNP (last 3 results) No results for input(s): PROBNP in the last 8760 hours. HbA1C: Recent Labs    06/26/21 0427  HGBA1C 5.2  5.2   CBG: No results for input(s): GLUCAP in the last 168 hours. Lipid Profile: Recent Labs    06/26/21 0427  CHOL 267*  HDL 49  LDLCALC 188*  TRIG 152*  CHOLHDL 5.4   Thyroid Function Tests: Recent Labs    06/25/21 2250  TSH 1.907   Anemia Panel: Recent Labs    06/27/21 0506  VITAMINB12 684   Sepsis Labs: No results for input(s): PROCALCITON, LATICACIDVEN in the last 168 hours.  Recent Results (from the past 240 hour(s))  Resp Panel by RT-PCR (Flu A&B, Covid) Nasopharyngeal Swab     Status: None   Collection Time: 06/26/21 12:20 AM   Specimen: Nasopharyngeal Swab; Nasopharyngeal(NP) swabs in vial transport medium  Result Value Ref Range Status   SARS Coronavirus 2 by RT PCR NEGATIVE NEGATIVE Final    Comment:  (NOTE) SARS-CoV-2 target nucleic acids are NOT DETECTED.  The SARS-CoV-2 RNA is generally detectable in upper respiratory specimens during the acute phase of infection. The lowest concentration of SARS-CoV-2 viral copies this assay can detect is 138 copies/mL. A negative result does not preclude SARS-Cov-2 infection and should not be used as the sole basis for treatment or other patient management decisions. A negative result may occur with  improper specimen collection/handling, submission of specimen other than nasopharyngeal swab, presence of viral mutation(s) within the areas targeted by this assay, and inadequate number of viral copies(<138 copies/mL). A negative result must be combined with clinical observations, patient history, and epidemiological information. The expected result is Negative.  Fact Sheet for Patients:  EntrepreneurPulse.com.au  Fact Sheet for Healthcare Providers:  IncredibleEmployment.be  This test is no t yet approved or cleared by the [place] FDA and  has been authorized for detection and/or diagnosis of  SARS-CoV-2 by FDA under an Emergency Use Authorization (EUA). This EUA will remain  in effect (meaning this test can be used) for the duration of the COVID-19 declaration under Section 564(b)(1) of the Act, 21 U.S.C.section 360bbb-3(b)(1), unless the authorization is terminated  or revoked sooner.       Influenza A by PCR NEGATIVE NEGATIVE Final   Influenza B by PCR NEGATIVE NEGATIVE Final    Comment: (NOTE) The Xpert Xpress SARS-CoV-2/FLU/RSV plus assay is intended as an aid in the diagnosis of influenza from Nasopharyngeal swab specimens and should not be used as a sole basis for treatment. Nasal washings and aspirates are unacceptable for Xpert Xpress SARS-CoV-2/FLU/RSV testing.  Fact Sheet for Patients: EntrepreneurPulse.com.au  Fact Sheet for Healthcare  Providers: IncredibleEmployment.be  This test is not yet approved or cleared by the [place] FDA and has been authorized for detection and/or diagnosis of SARS-CoV-2 by FDA under an Emergency Use Authorization (EUA). This EUA will remain in effect (meaning this test can be used) for the duration of the COVID-19 declaration under Section 564(b)(1) of the Act, 21 U.S.C. section 360bbb-3(b)(1), unless the authorization is terminated or revoked.  Performed at [facility] [address] [place], [place] [address]          Radiology Studies: CT HEAD WO CONTRAST (5MM)  Result Date: 06/25/2021 CLINICAL DATA:  Altered mental status. EXAM: CT HEAD WITHOUT CONTRAST TECHNIQUE: Contiguous axial images were obtained from the base of the skull through the vertex without intravenous contrast. COMPARISON:  December 08, 2020 FINDINGS: Brain: There is mild cerebral atrophy with widening of the extra-axial spaces and ventricular dilatation. There are areas of decreased attenuation within the white matter tracts of the supratentorial brain, consistent with microvascular disease changes. Small areas of cortical encephalomalacia, with adjacent chronic white matter low attenuation are seen within the bilateral cerebellar regions and parasagittal region of the right occipital lobe. Vascular: No hyperdense vessel or unexpected calcification. Skull: Normal. Negative for fracture or focal lesion. Sinuses/Orbits: No acute finding. Other: None. IMPRESSION: 1. No acute intracranial abnormality. 2. Generalized cerebral atrophy. 3. 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 Electronically Signed   By: Virgina Norfolk M.D.   On: 06/25/2021 23:34   MR ANGIO HEAD WO CONTRAST  Result Date: 06/26/2021 CLINICAL DATA:  Neuro deficit, acute, stroke suspected EXAM: MRI HEAD WITHOUT CONTRAST MRA HEAD WITHOUT CONTRAST TECHNIQUE: Multiplanar, multi-echo pulse sequences of the brain and  surrounding structures were acquired without intravenous contrast. Angiographic images of the Circle of Willis were acquired using MRA technique without intravenous contrast. COMPARISON:  No pertinent prior exam. FINDINGS: MRI HEAD Brain: Punctate focus of mildly reduced diffusion in the inferior left frontal lobe (series 11, image 16). There is no acute infarction or intracranial hemorrhage. There is no intracranial mass or mass effect. There is no hydrocephalus or extra-axial fluid collection. Chronic bilateral cerebellar infarcts. Chronic right occipital infarct. Patchy and confluent areas of T2 hyperintensity in the supratentorial and pontine white matter are nonspecific but may reflect moderate chronic microvascular ischemic changes. Vascular: Major vessel flow voids at the skull base are preserved. Skull and upper cervical spine: Normal marrow signal is preserved. Sinuses/Orbits: Paranasal sinuses are aerated. Orbits are unremarkable. Other: Sella is unremarkable.  Mastoid air cells are clear. MRA HEAD Motion artifact present. Intracranial internal carotid arteries are patent with atherosclerotic irregularity. Middle and anterior cerebral arteries are patent. Intracranial vertebral arteries, basilar artery, posterior cerebral arteries are patent. There is no significant stenosis. IMPRESSION: Punctate  acute infarct inferior left frontal lobe. Chronic right occipital and bilateral cerebellar infarcts. Moderate chronic microvascular ischemic changes. No proximal intracranial vessel occlusion or significant stenosis. Electronically Signed   By: Macy Mis M.D.   On: 06/26/2021 09:49   MR BRAIN WO CONTRAST  Result Date: 06/26/2021 CLINICAL DATA:  Neuro deficit, acute, stroke suspected EXAM: MRI HEAD WITHOUT CONTRAST MRA HEAD WITHOUT CONTRAST TECHNIQUE: Multiplanar, multi-echo pulse sequences of the brain and surrounding structures were acquired without intravenous contrast. Angiographic images of the Circle  of Willis were acquired using MRA technique without intravenous contrast. COMPARISON:  No pertinent prior exam. FINDINGS: MRI HEAD Brain: Punctate focus of mildly reduced diffusion in the inferior left frontal lobe (series 11, image 16). There is no acute infarction or intracranial hemorrhage. There is no intracranial mass or mass effect. There is no hydrocephalus or extra-axial fluid collection. Chronic bilateral cerebellar infarcts. Chronic right occipital infarct. Patchy and confluent areas of T2 hyperintensity in the supratentorial and pontine white matter are nonspecific but may reflect moderate chronic microvascular ischemic changes. Vascular: Major vessel flow voids at the skull base are preserved. Skull and upper cervical spine: Normal marrow signal is preserved. Sinuses/Orbits: Paranasal sinuses are aerated. Orbits are unremarkable. Other: Sella is unremarkable.  Mastoid air cells are clear. MRA HEAD Motion artifact present. Intracranial internal carotid arteries are patent with atherosclerotic irregularity. Middle and anterior cerebral arteries are patent. Intracranial vertebral arteries, basilar artery, posterior cerebral arteries are patent. There is no significant stenosis. IMPRESSION: Punctate acute infarct inferior left frontal lobe. Chronic right occipital and bilateral cerebellar infarcts. Moderate chronic microvascular ischemic changes. No proximal intracranial vessel occlusion or significant stenosis. Electronically Signed   By: Macy Mis M.D.   On: 06/26/2021 09:49   DG Chest Portable 1 View  Result Date: 06/25/2021 CLINICAL DATA:  Altered mental status. EXAM: PORTABLE CHEST 1 VIEW COMPARISON:  Chest radiograph dated 05/23/2021. FINDINGS: Focal consolidation, pleural effusion or pneumothorax mild cardiomegaly. No acute osseous pathology. With degenerative changes of the spine and shoulders. IMPRESSION: No active cardiopulmonary disease. Electronically Signed   By: Anner Crete M.D.    On: 06/25/2021 23:42        Scheduled Meds:  amLODipine  5 mg Oral Daily   aspirin EC  81 mg Oral Daily   atorvastatin  40 mg Oral Daily   brimonidine  1 drop Both Eyes BID   And   timolol  1 drop Both Eyes BID   clopidogrel  75 mg Oral Q breakfast   dorzolamide  1 drop Both Eyes BID   escitalopram  20 mg Oral Daily   fluorometholone  1 drop Right Eye QID   heparin  5,000 Units Subcutaneous Q8H   latanoprost  1 drop Both Eyes QHS   lisinopril  10 mg Oral Daily   Netarsudil Dimesylate  1 drop Both Eyes QHS   potassium chloride  40 mEq Oral Once   predniSONE  10 mg Oral Q breakfast   Continuous Infusions:     LOS: 1 day   Time spent= 35 mins    Deysy Schabel Arsenio Loader, MD [facility]  If 7PM-7AM, please contact night-coverage  06/27/2021, 11:34 AM

## 2021-06-27 NOTE — Progress Notes (Signed)
*  PRELIMINARY RESULTS* Echocardiogram 2D Echocardiogram has been performed.  Elpidio Anis 06/27/2021, 10:52 AM

## 2021-06-27 NOTE — Progress Notes (Signed)
Zwingle A. [name], MD     [email or web address]          Mckenzie [name] is an 75 y.o. female.   Assessment/Plan: Unexplained encephalopathy:  Suspect meds - detrol last added 06-2021 but also consider actemra. Suggest spinal tab given persistent confusion though.    Quit confused still; hallucinations, agitations and cursing at nurses last night; EEG fine - just slow    Objective: Vital signs in last 24 hours: Temp:  [98.3 F (36.8 C)-98.5 F (36.9 C)] 98.5 F (36.9 C) (09/21 1411) Pulse Rate:  [77-93] 77 (09/21 1411) Resp:  [20] 20 (09/21 0527) BP: (106-168)/(60-101) 106/60 (09/21 1411) SpO2:  [95 %-100 %] 100 % (09/21 1411)  GENERAL:  She is awake and alert and cooperates with evaluation. She is a little restless.   HEENT:  Neck is supple no trauma noted.   ABDOMEN: soft   EXTREMITIES: No edema    BACK: normal   SKIN:   Several petechial hemorrhage noted in the upper lower extremities especially the legs. Otherwise no rashes noted    MENTAL STATUS:  She is awake and cooperates with evaluation. She thinks sees at her daughter's home. She is not oriented to time. She is not oriented to why she is in the hospital. She does follow commands and speech is normal.   CRANIAL NERVES: Pupils are equal, round and reactive to light and accomodation; extra ocular movements are full, there is no significant nystagmus; visual fields are full; upper and lower facial muscles are normal in strength and symmetric, there is no flattening of the nasolabial folds; tongue is midline; uvula is midline; shoulder elevation is normal.   MOTOR: Normal tone, bulk and strength; no pronator drift.      Intake/Output from previous day: 09/20 0701 - 09/21 0700 In: 180.2 [IV Piggyback:180.2] Out: -  Intake/Output this shift: Total I/O In: 360 [P.O.:360] Out: 800 [Urine:800] Nutritional status:  Diet Order             Diet Heart Room service appropriate? Yes; Fluid  consistency: Thin  Diet effective now                    Lab Results: Results for orders placed or performed during the hospital encounter of 06/25/21 (from the past 48 hour(s))  CBC with Differential/Platelet     Status: None   Collection Time: 06/25/21 10:50 PM  Result Value Ref Range   WBC 8.8 4.0 - 10.5 K/uL   RBC 4.40 3.87 - 5.11 MIL/uL   Hemoglobin 13.6 12.0 - 15.0 g/dL   HCT 41.0 36.0 - 46.0 %   MCV 93.2 80.0 - 100.0 fL   MCH 30.9 26.0 - 34.0 pg   MCHC 33.2 30.0 - 36.0 g/dL   RDW 13.2 11.5 - 15.5 %   Platelets 237 150 - 400 K/uL   nRBC 0.0 0.0 - 0.2 %   Neutrophils Relative % 65 %   Neutro Abs 5.7 1.7 - 7.7 K/uL   Lymphocytes Relative 24 %   Lymphs Abs 2.1 0.7 - 4.0 K/uL   Monocytes Relative 10 %   Monocytes Absolute 0.9 0.1 - 1.0 K/uL   Eosinophils Relative 1 %   Eosinophils Absolute 0.1 0.0 - 0.5 K/uL   Basophils Relative 0 %   Basophils Absolute 0.0 0.0 - 0.1 K/uL   Immature Granulocytes 0 %   Abs Immature Granulocytes 0.02 0.00 - 0.07 K/uL    Comment: Performed at [facility]  [address] [place], [place] [address]  Comprehensive metabolic panel     Status: Abnormal   Collection Time: 06/25/21 10:50 PM  Result Value Ref Range   Sodium 135 135 - 145 mmol/L   Potassium 3.0 (L) 3.5 - 5.1 mmol/L   Chloride 101 98 - 111 mmol/L   CO2 28 22 - 32 mmol/L   Glucose, Bld 110 (H) 70 - 99 mg/dL    Comment: Glucose reference range applies only to samples taken after fasting for at least 8 hours.   BUN 20 8 - 23 mg/dL   Creatinine, Ser 0.62 0.44 - 1.00 mg/dL   Calcium 8.8 (L) 8.9 - 10.3 mg/dL   Total Protein 6.3 (L) 6.5 - 8.1 g/dL   Albumin 3.8 3.5 - 5.0 g/dL   AST 18 15 - 41 U/L   ALT 21 0 - 44 U/L   Alkaline Phosphatase 184 (H) 38 - 126 U/L   Total Bilirubin 0.8 0.3 - 1.2 mg/dL   GFR, Estimated >60 >60 mL/min    Comment: (NOTE) Calculated using the CKD-EPI Creatinine Equation (2021)    Anion gap 6 5 - 15    Comment: Performed at [facility], [address] [place], [place] [address]  Troponin I (High Sensitivity)     Status: None   Collection Time: 06/25/21 10:50 PM  Result Value Ref Range   Troponin I (High Sensitivity) 11 <18 ng/L    Comment: (NOTE) Elevated high sensitivity troponin I (hsTnI) values and significant  changes across serial measurements may suggest ACS but many other  chronic and acute conditions are known to elevate hsTnI results.  Refer to the "Links" section for chest pain algorithms and additional  guidance. Performed at [facility], [address] [place], [place] [address]   Ammonia     Status: None   Collection Time: 06/25/21 10:50 PM  Result Value Ref Range   Ammonia 9 9 - 35 umol/L    Comment: Performed at [facility], [address] [place], [place] [address]  TSH     Status: None   Collection Time: 06/25/21 10:50 PM  Result Value Ref Range   TSH 1.907 0.350 - 4.500 uIU/mL    Comment: Performed by a 3rd Generation assay with a functional sensitivity of <=0.01 uIU/mL. Performed at [facility], [address], [place], [place] [address]   Sedimentation rate     Status: None   Collection Time: 06/25/21 10:50 PM  Result Value Ref Range   Sed Rate 15 0 - 22 mm/hr    Comment: Performed at [facility], [address] [place], [place] [address]  C-reactive protein     Status: None   Collection Time: 06/25/21 10:50 PM  Result Value Ref Range   CRP 0.5 <1.0 mg/dL    Comment: Performed at [facility], [address] [place],  [address]  Resp Panel by RT-PCR (Flu A&B, Covid) Nasopharyngeal Swab     Status: None   Collection Time: 06/26/21 12:20 AM   Specimen: Nasopharyngeal Swab; Nasopharyngeal(NP) swabs in vial transport medium  Result Value Ref Range   SARS Coronavirus 2 by RT PCR NEGATIVE NEGATIVE    Comment: (NOTE) SARS-CoV-2 target nucleic acids are NOT DETECTED.  The SARS-CoV-2 RNA is generally detectable in upper respiratory specimens during the acute phase of infection. The  lowest concentration of SARS-CoV-2 viral copies this assay can detect is 138 copies/mL. A negative result does not preclude SARS-Cov-2 infection and should not  be used as the sole basis for treatment or other patient management decisions. A negative result may occur with  improper specimen collection/handling, submission of specimen other than nasopharyngeal swab, presence of viral mutation(s) within the areas targeted by this assay, and inadequate number of viral copies(<138 copies/mL). A negative result must be combined with clinical observations, patient history, and epidemiological information. The expected result is Negative.  Fact Sheet for Patients:  EntrepreneurPulse.com.au  Fact Sheet for Healthcare Providers:  IncredibleEmployment.be  This test is no t yet approved or cleared by the [place] FDA and  has been authorized for detection and/or diagnosis of SARS-CoV-2 by FDA under an Emergency Use Authorization (EUA). This EUA will remain  in effect (meaning this test can be used) for the duration of the COVID-19 declaration under Section 564(b)(1) of the Act, 21 U.S.C.section 360bbb-3(b)(1), unless the authorization is terminated  or revoked sooner.       Influenza A by PCR NEGATIVE NEGATIVE   Influenza B by PCR NEGATIVE NEGATIVE    Comment: (NOTE) The Xpert Xpress SARS-CoV-2/FLU/RSV plus assay is intended as an aid in the diagnosis of influenza from Nasopharyngeal swab specimens and should not be used as a sole basis for treatment. Nasal washings and aspirates are unacceptable for Xpert Xpress SARS-CoV-2/FLU/RSV testing.  Fact Sheet for Patients: EntrepreneurPulse.com.au  Fact Sheet for Healthcare Providers: IncredibleEmployment.be  This test is not yet approved or cleared by the [place] FDA and has been authorized for detection and/or diagnosis of SARS-CoV-2 by FDA under an Emergency  Use Authorization (EUA). This EUA will remain in effect (meaning this test can be used) for the duration of the COVID-19 declaration under Section 564(b)(1) of the Act, 21 U.S.C. section 360bbb-3(b)(1), unless the authorization is terminated or revoked.  Performed at [facility], [address], [place], [place] [address]   Troponin I (High Sensitivity)     Status: None   Collection Time: 06/26/21  1:13 AM  Result Value Ref Range   Troponin I (High Sensitivity) 10 <18 ng/L    Comment: (NOTE) Elevated high sensitivity troponin I (hsTnI) values and significant  changes across serial measurements may suggest ACS but many other  chronic and acute conditions are known to elevate hsTnI results.  Refer to the "Links" section for chest pain algorithms and additional  guidance. Performed at [facility], [address] [place], [place] [address]   Urinalysis, Routine w reflex microscopic Urine, Clean Catch     Status: Abnormal   Collection Time: 06/26/21  2:19 AM  Result Value Ref Range   Color, Urine YELLOW YELLOW   APPearance CLEAR CLEAR   Specific Gravity, Urine 1.018 1.005 - 1.030   pH 5.0 5.0 - 8.0   Glucose, UA NEGATIVE NEGATIVE mg/dL   Hgb urine dipstick NEGATIVE NEGATIVE   Bilirubin Urine NEGATIVE NEGATIVE   Ketones, ur 5 (A) NEGATIVE mg/dL   Protein, ur 30 (A) NEGATIVE mg/dL   Nitrite NEGATIVE NEGATIVE   Leukocytes,Ua NEGATIVE NEGATIVE   RBC / HPF 0-5 0 - 5 RBC/hpf   WBC, UA 0-5 0 - 5 WBC/hpf   Bacteria, UA NONE SEEN NONE SEEN   Squamous Epithelial / LPF 0-5 0 - 5    Comment: Performed at [facility], [address], [place], [place] [address]  Hemoglobin A1c     Status: None   Collection Time: 06/26/21  4:27 AM  Result Value Ref Range   Hgb A1c MFr Bld 5.2 4.8 - 5.6 %  Comment: (NOTE) Pre diabetes:          5.7%-6.4%  Diabetes:              >6.4%  Glycemic control for   <7.0% adults with diabetes    Mean Plasma Glucose 102.54 mg/dL    Comment: Performed at [facility] [address] [place], [place] [address]  Lipid panel     Status: Abnormal   Collection Time: 06/26/21  4:27 AM  Result Value Ref Range   Cholesterol 267 (H) 0 - 200 mg/dL   Triglycerides 152 (H) <150 mg/dL   HDL 49 >40 mg/dL   Total CHOL/HDL Ratio 5.4 RATIO   VLDL 30 0 - 40 mg/dL   LDL Cholesterol 188 (H) 0 - 99 mg/dL    Comment:        Total Cholesterol/HDL:CHD Risk Coronary Heart Disease Risk Table                     Men   Women  1/2 Average Risk   3.4   3.3  Average Risk       5.0   4.4  2 X Average Risk   9.6   7.1  3 X Average Risk  23.4   11.0        Use the calculated Patient Ratio above and the CHD Risk Table to determine the patient's CHD Risk.        ATP III CLASSIFICATION (LDL):  <100     mg/dL   Optimal  100-129  mg/dL   Near or Above                    Optimal  130-159  mg/dL   Borderline  160-189  mg/dL   High  >190     mg/dL   Very High Performed at [facility] [place], [place] [address]   Comprehensive metabolic panel     Status: Abnormal   Collection Time: 06/26/21  4:27 AM  Result Value Ref Range   Sodium 136 135 - 145 mmol/L   Potassium 3.0 (L) 3.5 - 5.1 mmol/L   Chloride 101 98 - 111 mmol/L   CO2 29 22 - 32 mmol/L   Glucose, Bld 105 (H) 70 - 99 mg/dL    Comment: Glucose reference range applies only to samples taken after fasting for at least 8 hours.   BUN 16 8 - 23 mg/dL   Creatinine, Ser 0.55 0.44 - 1.00 mg/dL   Calcium 8.6 (L) 8.9 - 10.3 mg/dL   Total Protein 5.7 (L) 6.5 - 8.1 g/dL   Albumin 3.4 (L) 3.5 - 5.0 g/dL   AST 18 15 - 41 U/L   ALT 20 0 - 44 U/L   Alkaline Phosphatase 172 (H) 38 - 126 U/L   Total Bilirubin 0.8 0.3 - 1.2 mg/dL   GFR, Estimated >60 >60 mL/min    Comment: (NOTE) Calculated using the CKD-EPI Creatinine Equation (2021)    Anion gap 6 5 - 15    Comment: Performed at [facility], [address] [place], [place] [address]  CBC     Status: None   Collection Time: 06/26/21  4:27 AM   Result Value Ref Range   WBC 7.7 4.0 - 10.5 K/uL   RBC 4.29 3.87 - 5.11 MIL/uL   Hemoglobin 13.2 12.0 - 15.0 g/dL   HCT 39.8 36.0 - 46.0 %   MCV 92.8 80.0 -  100.0 fL   MCH 30.8 26.0 - 34.0 pg   MCHC 33.2 30.0 - 36.0 g/dL   RDW 13.1 11.5 - 15.5 %   Platelets 208 150 - 400 K/uL   nRBC 0.0 0.0 - 0.2 %    Comment: Performed at [facility], [address] [place], [place] [address]  Hemoglobin A1c     Status: None   Collection Time: 06/26/21  4:27 AM  Result Value Ref Range   Hgb A1c MFr Bld 5.2 4.8 - 5.6 %    Comment: (NOTE) Pre diabetes:          5.7%-6.4%  Diabetes:              >6.4%  Glycemic control for   <7.0% adults with diabetes    Mean Plasma Glucose 102.54 mg/dL    Comment: Performed at [facility] [address] [place], [place] [address]  Magnesium     Status: None   Collection Time: 06/26/21  4:27 AM  Result Value Ref Range   Magnesium 2.3 1.7 - 2.4 mg/dL    Comment: Performed at [facility], [address] [place], [place] [address]  Basic metabolic panel     Status: Abnormal   Collection Time: 06/27/21  5:06 AM  Result Value Ref Range   Sodium 137 135 - 145 mmol/L   Potassium 3.3 (L) 3.5 - 5.1 mmol/L   Chloride 103 98 - 111 mmol/L   CO2 25 22 - 32 mmol/L   Glucose, Bld 107 (H) 70 - 99 mg/dL    Comment: Glucose reference range applies only to samples taken after fasting for at least 8 hours.   BUN 13 8 - 23 mg/dL   Creatinine, Ser 0.40 (L) 0.44 - 1.00 mg/dL   Calcium 9.2 8.9 - 10.3 mg/dL   GFR, Estimated >60 >60 mL/min    Comment: (NOTE) Calculated using the CKD-EPI Creatinine Equation (2021)    Anion gap 9 5 - 15    Comment: Performed at [facility], [address] [place], [place] [address]  CBC     Status: None   Collection Time: 06/27/21  5:06 AM  Result Value Ref Range   WBC 8.4 4.0 - 10.5 K/uL   RBC 4.61 3.87 - 5.11 MIL/uL   Hemoglobin 14.1 12.0 - 15.0 g/dL   HCT 43.5 36.0 - 46.0 %   MCV 94.4 80.0 - 100.0 fL   MCH 30.6 26.0 - 34.0  pg   MCHC 32.4 30.0 - 36.0 g/dL   RDW 13.2 11.5 - 15.5 %   Platelets 238 150 - 400 K/uL   nRBC 0.0 0.0 - 0.2 %    Comment: Performed at [facility], [address], [place], [place] [address]  Magnesium     Status: None   Collection Time: 06/27/21  5:06 AM  Result Value Ref Range   Magnesium 2.0 1.7 - 2.4 mg/dL    Comment: Performed at [facility], [address], [place], [place] [address]  Vitamin B12     Status: None   Collection Time: 06/27/21  5:06 AM  Result Value Ref Range   Vitamin B-12 684 180 - 914 pg/mL    Comment: (NOTE) This assay is not validated for testing neonatal or myeloproliferative syndrome specimens for Vitamin B12 levels. Performed at [facility], [address] [place], [place] [address]     Lipid Panel Recent Labs    06/26/21 0427  CHOL 267*  TRIG 152*  HDL 49  CHOLHDL 5.4  VLDL 30  LDLCALC 188*    Studies/Results:   Medications:  Scheduled Meds:  amLODipine  5 mg Oral Daily   aspirin EC  81 mg Oral Daily   atorvastatin  40 mg Oral Daily   brimonidine  1 drop Both Eyes BID   And   timolol  1 drop Both Eyes BID   clopidogrel  75 mg Oral Q breakfast   dorzolamide  1 drop Both Eyes BID   escitalopram  20 mg Oral Daily   fluorometholone  1 drop Right Eye QID   heparin  5,000 Units Subcutaneous Q8H   ketorolac  1 drop Right Eye Daily   latanoprost  1 drop Both Eyes QHS   lisinopril  10 mg Oral Daily   Netarsudil Dimesylate  1 drop Both Eyes QHS   predniSONE  10 mg Oral Q breakfast   Continuous Infusions: PRN Meds:.acetaminophen **OR** acetaminophen (TYLENOL) oral liquid 160 mg/5 mL **OR** acetaminophen, haloperidol **OR** haloperidol lactate, hydrALAZINE, ipratropium-albuterol, metoprolol tartrate, senna-docusate, traZODone     LOS: 1 day   Kacy Conely A. [name], M.D.  Diplomate, Tax adviser of Psychiatry and Neurology ( Neurology).

## 2021-06-27 NOTE — Procedures (Signed)
Mckenzie Mckenzie [name] Mckenzie [name]. [name], MD     [email or web address]           HISTORY: The patient is 9 and presents with altered mental status. 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  MEDICATIONS:  Current Facility-Administered Medications:    acetaminophen (TYLENOL) tablet 650 mg, 650 mg, Oral, Q4H PRN **OR** acetaminophen (TYLENOL) 160 MG/5ML solution 650 mg, 650 mg, Per Tube, Q4H PRN **OR** acetaminophen (TYLENOL) suppository 650 mg, 650 mg, Rectal, Q4H PRN, Mckenzie Mckenzie [name], Mckenzie B, DO   amLODipine (NORVASC) tablet 5 mg, 5 mg, Oral, Daily, Mckenzie Mckenzie [name], Mckenzie Chirag, MD, 5 mg at 06/27/21 1126   aspirin EC tablet 81 mg, 81 mg, Oral, Daily, Mckenzie Mckenzie [name], Mckenzie B, DO, 81 mg at 06/27/21 1124   atorvastatin (LIPITOR) tablet 40 mg, 40 mg, Oral, Daily, Mckenzie Mckenzie [name], Mckenzie Chirag, MD, 40 mg at 06/27/21 1125   brimonidine (ALPHAGAN) 0.2 % ophthalmic solution 1 drop, 1 drop, Both Eyes, BID, 1 drop at 06/27/21 1212 **AND** timolol (TIMOPTIC) 0.5 % ophthalmic solution 1 drop, 1 drop, Both Eyes, BID, Mckenzie Mckenzie [name], Mckenzie Mckenzie [name], RPH, 1 drop at 06/27/21 1217   clopidogrel (PLAVIX) tablet 75 mg, 75 mg, Oral, Q breakfast, Mckenzie Cafarella, MD, 75 mg at 06/27/21 1125   dorzolamide (TRUSOPT) 2 % ophthalmic solution 1 drop, 1 drop, Both Eyes, BID, Mckenzie Mckenzie [name], Mckenzie Chirag, MD, 1 drop at 06/27/21 1221   escitalopram (LEXAPRO) tablet 20 mg, 20 mg, Oral, Daily, Mckenzie Mckenzie [name], Mckenzie B, DO, 20 mg at 06/27/21 1123   fluorometholone (FML) 0.1 % ophthalmic suspension 1 drop, 1 drop, Right Eye, QID, Mckenzie Mckenzie [name], Mckenzie Chirag, MD, 1 drop at 06/27/21 1219   haloperidol (HALDOL) tablet 1 mg, 1 mg, Oral, Q6H PRN **OR** haloperidol lactate (HALDOL) injection 2 mg, 2 mg, Intramuscular, Q6H PRN, Mckenzie Mckenzie [name], Mckenzie Chirag, MD   heparin injection 5,000 Units, 5,000 Units, Subcutaneous, Q8H, Mckenzie Mckenzie [name], Mckenzie B, DO, 5,000 Units at 06/27/21 0518   hydrALAZINE (APRESOLINE) injection 10 mg, 10 mg, Intravenous, Q4H PRN, Mckenzie Mckenzie [name], Mckenzie Chirag, MD, 10  mg at 06/26/21 1136   ipratropium-albuterol (DUONEB) 0.5-2.5 (3) MG/3ML nebulizer solution 3 mL, 3 mL, Nebulization, Q4H PRN, Mckenzie Mckenzie [name], Mckenzie Chirag, MD   ketorolac (ACULAR) 0.5 % ophthalmic solution 1 drop, 1 drop, Right Eye, Daily, Mckenzie Mckenzie [name], Mckenzie Chirag, MD   latanoprost (XALATAN) 0.005 % ophthalmic solution 1 drop, 1 drop, Both Eyes, QHS, Mckenzie Mckenzie [name], Mckenzie Chirag, MD, 1 drop at 06/26/21 2303   lisinopril (ZESTRIL) tablet 10 mg, 10 mg, Oral, Daily, Mckenzie Mckenzie [name], Mckenzie Chirag, MD, 10 mg at 06/27/21 1125   metoprolol tartrate (LOPRESSOR) injection 5 mg, 5 mg, Intravenous, Q4H PRN, Mckenzie Mckenzie [name], Mckenzie Chirag, MD   Netarsudil Dimesylate 0.02 % SOLN 1 drop, 1 drop, Both Eyes, QHS, Mckenzie Mckenzie [name], Mckenzie Chirag, MD   predniSONE (DELTASONE) tablet 10 mg, 10 mg, Oral, Q breakfast, Mckenzie Mckenzie [name], Mckenzie B, DO, 10 mg at 06/27/21 1214   senna-docusate (Senokot-S) tablet 1 tablet, 1 tablet, Oral, QHS PRN, Mckenzie Mckenzie [name], Mckenzie B, DO   traZODone (DESYREL) tablet 50 mg, 50 mg, Oral, QHS PRN, Mckenzie Mckenzie [name], Mckenzie Chirag, MD     ANALYSIS: Mckenzie [name] 16 channel recording using standard 10 20 measurements is conducted for 23 minutes.  The background activity gets as high as 6-7 hertz. There is beta activity observed in the frontal areas. Most of the recording occurs during sleep with K complexes, spindles and even slow-wave activity. There is no focal or lateral slowing. There is no epileptiform activities noted.   IMPRESSION:  1.  This recording of the awake and sleep states  shows some mild to moderate global slowing. However, no epileptiform activities are noted.      Mckenzie Mckenzie [name]. [name], M.D.  Diplomate, Tax adviser of Psychiatry and Neurology ( Neurology).

## 2021-06-27 NOTE — Progress Notes (Signed)
EEG completed, results pending. 

## 2021-06-27 NOTE — Progress Notes (Signed)
Physical Therapy Treatment Patient Details Name: Mckenzie [name] MRN: [id number] DOB: 05-Jun-1946 Today's Date: 06/27/2021   History of Present Illness Mckenzie [name]  is a 75 y.o. female, with history of depression, high cholesterol, hypertension, temporal arteritis, polymyalgia, stroke, and more presents the ED with a chief complaint of slurred speech, confusion, right arm weakness.  Husband is at bedside and provides most of the history because patient is confused about the event and still remains mildly confused now.  He reports that she was unsteady when he was putting her to bed around 9 PM, she could not raise her right hand, and she had slurred speech.  He reports that she was confused and was talking about calling people who are deceased for some time.  She does have a history of 2 strokes, and he thought maybe she is having another stroke.  Her symptoms lasted approximately 30 minutes, by the time EMS got there she was mostly back to baseline.  Her right arm strength was normal again, her speech was normal again.  Patient does still remain slightly confused.  She reports that she had surgery on her eyes couple days ago, husband is there telling her that that did not happen and that she is confused.  She was post to see rheumatologist today.  There was some concern that she was having withdrawal from decreasing her prednisone too quickly.  They were trying to wean her off and they recently decreased her in a stepwise approach from 50 mg to 7-1/2 mg.  Patient did have nausea and vomiting yesterday.  She was refusing to eat or drink.  She has been fatigued.  She is complaining of aching all over to her husband, but she tells me she is not hurting at all.  He reports that she is still hypersensitive and even when he checks blood pressure she screams in pain.  That has been going on since January when she was diagnosed with polymyalgia.  Patient is able to spell her name and the year, but she thinks she  is in the buy right.    PT Comments    Pt sitting in bed and agreeable to working with therapist today.  Pt required increased time to complete supine to sit and min assist from therapist to come to edge of bed with verbal cues.  Cues for UE placement and using UE to push up rather than pull up using RW to stand from edge of bed.  Pt with frequent VC's to take larger steps and focus gaze forward with ambulation rather than looking down and maintaining a forward flexed posture.  PT with difficulty navigating walker as the back stoppers did not slide easily on the floor.  Pt with noted fatigue toward end of ambulation.  Pt returned to chair as she needed her linens changed on her bed prior to transitioning back to bed (per request).  Nurse made aware of this.   Spouse was present in room with patient.       Recommendations for follow up therapy are one component of a multi-disciplinary discharge planning process, led by the attending physician.  Recommendations may be updated based on patient status, additional functional criteria and insurance authorization.  Follow Up Recommendations        Equipment Recommendations       Recommendations for Other Services       Precautions / Restrictions Precautions Precautions: Fall Restrictions Weight Bearing Restrictions: No     Mobility  Bed Mobility Overal  bed mobility: Needs Assistance Bed Mobility: Rolling;Sidelying to Sit;Sit to Sidelying Rolling: Min guard;Min assist Sidelying to sit: Min assist     Sit to sidelying: Min assist General bed mobility comments: increased time, max cues    Transfers Overall transfer level: Needs assistance Equipment used: Rolling walker (2 wheeled) Transfers: Sit to/from Stand Sit to Stand: Min assist            Ambulation/Gait Ambulation/Gait assistance: Min assist;Mod assist Gait Distance (Feet): 60 Feet Assistive device: Rolling walker (2 wheeled)   Gait velocity: decreased   General  Gait Details: slow labored cadence without loss of balance, limited  mostly due to fatigue and assistance to navigate walker due to back legs catching         Cognition Arousal/Alertness: Awake/alert Behavior During Therapy: WFL for tasks assessed/performed Overall Cognitive Status: History of cognitive impairments - at baseline                                               Pertinent Vitals/Pain Pain Assessment: No/denies pain     PT Goals (current goals can now be found in the care plan section) Progress towards PT goals: Progressing toward goals    Frequency           PT Plan Continue to progress towards goals.        AM-PAC PT "6 Clicks" Mobility   Outcome Measure  Help needed turning from your back to your side while in a flat bed without using bedrails?: A Little Help needed moving from lying on your back to sitting on the side of a flat bed without using bedrails?: A Lot Help needed moving to and from a bed to a chair (including a wheelchair)?: A Little Help needed standing up from a chair using your arms (e.g., wheelchair or bedside chair)?: A Little Help needed to walk in hospital room?: A Lot Help needed climbing 3-5 steps with a railing? : A Lot 6 Click Score: 15    End of Session Equipment Utilized During Treatment: Gait belt Activity Tolerance: Patient tolerated treatment well;Patient limited by fatigue Patient left: with call bell/phone within reach;in chair;with family/visitor present Nurse Communication: Mobility status;Other (comment) (need to change linens on bed prior to returning) PT Visit Diagnosis: Unsteadiness on feet (R26.81);Other abnormalities of gait and mobility (R26.89);Muscle weakness (generalized) (M62.81)     Time: 1125-1150 PT Time Calculation (min) (ACUTE ONLY): 25 min  Charges:  $Gait Training: 8-22 mins $Therapeutic Activity: 8-22 mins                     Teena Irani,  PTA/CLT [phone]    Roseanne Reno B 06/27/2021, 5:31 PM

## 2021-06-28 ENCOUNTER — Inpatient Hospital Stay (HOSPITAL_COMMUNITY): Payer: Medicare Other

## 2021-06-28 ENCOUNTER — Encounter (HOSPITAL_COMMUNITY): Payer: Self-pay | Admitting: Internal Medicine

## 2021-06-28 LAB — CRYPTOCOCCAL ANTIGEN, CSF: Crypto Ag: NEGATIVE

## 2021-06-28 LAB — BASIC METABOLIC PANEL
Anion gap: 7 (ref 5–15)
BUN: 23 mg/dL (ref 8–23)
CO2: 23 mmol/L (ref 22–32)
Calcium: 8.8 mg/dL — ABNORMAL LOW (ref 8.9–10.3)
Chloride: 104 mmol/L (ref 98–111)
Creatinine, Ser: 0.47 mg/dL (ref 0.44–1.00)
GFR, Estimated: >60 mL/min (ref >60)
Glucose, Bld: 102 mg/dL — ABNORMAL HIGH (ref 70–99)
Potassium: 3.5 mmol/L (ref 3.5–5.1)
Sodium: 134 mmol/L — ABNORMAL LOW (ref 135–145)

## 2021-06-28 LAB — MAGNESIUM: Magnesium: 2 mg/dL (ref 1.7–2.4)

## 2021-06-28 LAB — CBC
HCT: 39.9 % (ref 36.0–46.0)
Hemoglobin: 13.1 g/dL (ref 12.0–15.0)
MCH: 30.9 pg (ref 26.0–34.0)
MCHC: 32.8 g/dL (ref 30.0–36.0)
MCV: 94.1 fL (ref 80.0–100.0)
Platelets: 246 10*3/uL (ref 150–400)
RBC: 4.24 MIL/uL (ref 3.87–5.11)
RDW: 13.2 % (ref 11.5–15.5)
WBC: 8.2 10*3/uL (ref 4.0–10.5)
nRBC: 0 % (ref 0.0–0.2)

## 2021-06-28 LAB — PROTEIN AND GLUCOSE, CSF
Glucose, CSF: 68 mg/dL (ref 40–70)
Total  Protein, CSF: 80 mg/dL — ABNORMAL HIGH (ref 15–45)

## 2021-06-28 LAB — CSF CELL COUNT WITH DIFFERENTIAL
RBC Count, CSF: 1 /mm3 — ABNORMAL HIGH
Tube #: 4
WBC, CSF: 1 /mm3 (ref 0–5)

## 2021-06-28 LAB — HOMOCYSTEINE: Homocysteine: 7.3 umol/L (ref 0.0–19.2)

## 2021-06-28 MED ORDER — SODIUM CHLORIDE 0.9 % IV SOLN
1000.0000 mg | INTRAVENOUS | Status: AC
Start: 2021-06-28 — End: 2021-06-29
  Administered 2021-06-28 – 2021-06-29 (×2): 1000 mg via INTRAVENOUS
  Filled 2021-06-28 (×2): qty 16

## 2021-06-28 MED ORDER — POTASSIUM CHLORIDE CRYS ER 20 MEQ PO TBCR
40.0000 meq | EXTENDED_RELEASE_TABLET | Freq: Once | ORAL | Status: AC
  Administered 2021-06-28: 40 meq via ORAL
  Filled 2021-06-28: qty 2

## 2021-06-28 NOTE — Progress Notes (Signed)
PROGRESS NOTE    Mckenzie [name]  VOJ:[id number] DOB: 01/21/1946 DOA: 06/25/2021 PCP: Loman Brooklyn, FNP   Brief Narrative:  75 year old with history of depression, HLD, HTN, temporal arteritis, polymyalgia, stroke came to the ED with complaints of slurred speech, confusion and right arm weakness.  The symptoms lasted for about 30 minutes.  Upon admission patient had CT of the head done which was unremarkable.  MRI of the brain was positive for acute CVA, neurology team was consulted as well.  No large vessel occlusion were noted.  Neurology recommended obtaining EEG, which was negative. LP was ordered    Assessment & Plan:   Active Problems:   Essential hypertension   Acute metabolic encephalopathy   Depression   TIA (transient ischemic attack)   Hypokalemia   Temporal arteritis (HCC)   Acute CVA (cerebrovascular accident) ([place])  Acute CVA, inferior left frontal lobe Acute encephalopathy likely following CVA with some delirium -Permissive hypertension.  She is still off-and-on delirious but improving now.  -MRI brain without contrast and MRA head and neck without contrast is positive for acute CVA but negative for any acute LVO -Continue aspirin, defer addition of Plavix to neurology -Echocardiogram-results pending -LDL 188.  TSH normal, A1c 5.2 -Statin -Neurology following - EEG-neg, LP ordered by Neuro -UA and Ammonia - neg  Hypokalemia - Repletion  Essential hypertension, uncontrolled. SBP >230 - Continue lisinopril, Norvasc 5 mg daily added.  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  Follows with rheumatology, family and patient seems to the Actemra is driving her blood pressure significantly high  Depression - Lexapro    DVT prophylaxis: SCD's Start: 06/26/21 0138 Code Status: Full Family Communication: Husband is at bedside.    Dispo: The patient is from: Home              Anticipated d/c is to:  Home              Patient currently is not medically stable to d/c.  Patient is still quite confused ongoing work-up per neurology   Difficult to place patient No        Subjective: Ptn is awake this morning, rested well yesterday. Husband is present at bedside.   Review of Systems Otherwise negative except as per HPI, including: General = no fevers, chills, dizziness,  fatigue HEENT/EYES = negative for loss of vision, double vision, blurred vision,  sore throa Cardiovascular= negative for chest pain, palpitation Respiratory/lungs= negative for shortness of breath, cough, wheezing; hemoptysis,  Gastrointestinal= negative for nausea, vomiting, abdominal pain Genitourinary= negative for Dysuria MSK = Negative for arthralgia, myalgias Neurology= Negative for headache, numbness, tingling  Psychiatry= Negative for suicidal and homocidal ideation Skin= Negative for Rash   Examination:  Constitutional: Not in acute distress Respiratory: Clear to auscultation bilaterally Cardiovascular: Normal sinus rhythm, no rubs Abdomen: Nontender nondistended good bowel sounds Musculoskeletal: No edema noted Skin: No rashes seen Neurologic: CN 2-12 grossly intact.  And nonfocal Psychiatric: Normal judgment and insight. Alert and oriented x 3. Normal mood.      Objective: Vitals:   06/27/21 2029 06/27/21 2215 06/28/21 0529 06/28/21 0901  BP: 134/61 (!) 149/54 (!) 150/76 (!) 140/56  Pulse: 83 80 89 82  Resp: 19 20 20 18   Temp: 98 F (36.7 C)  98.3 F (36.8 C) 99.1 F (37.3 C)  TempSrc:    Oral  SpO2: 97% 96% 97% 95%  Weight:      Height:  Intake/Output Summary (Last 24 hours) at 06/28/2021 1048 Last data filed at 06/28/2021 1047 Gross per 24 hour  Intake 440 ml  Output 1200 ml  Net -760 ml   Filed Weights   06/25/21 2244  Weight: 59 kg     Data Reviewed:   CBC: Recent Labs  Lab 06/25/21 2250 06/26/21 0427 06/27/21 0506 06/28/21 0526  WBC 8.8 7.7 8.4 8.2   NEUTROABS 5.7  --   --   --   HGB 13.6 13.2 14.1 13.1  HCT 41.0 39.8 43.5 39.9  MCV 93.2 92.8 94.4 94.1  PLT 237 208 238 858   Basic Metabolic Panel: Recent Labs  Lab 06/25/21 2250 06/26/21 0427 06/27/21 0506 06/28/21 0526  NA 135 136 137 134*  K 3.0* 3.0* 3.3* 3.5  CL 101 101 103 104  CO2 28 29 25 23   GLUCOSE 110* 105* 107* 102*  BUN 20 16 13 23   CREATININE 0.62 0.55 0.40* 0.47  CALCIUM 8.8* 8.6* 9.2 8.8*  MG  --  2.3 2.0 2.0   GFR: Estimated Creatinine Clearance: 53.8 mL/min (by C-G formula based on SCr of 0.47 mg/dL). Liver Function Tests: Recent Labs  Lab 06/25/21 2250 06/26/21 0427  AST 18 18  ALT 21 20  ALKPHOS 184* 172*  BILITOT 0.8 0.8  PROT 6.3* 5.7*  ALBUMIN 3.8 3.4*   No results for input(s): LIPASE, AMYLASE in the last 168 hours. Recent Labs  Lab 06/25/21 2250  AMMONIA 9   Coagulation Profile: No results for input(s): INR, PROTIME in the last 168 hours. Cardiac Enzymes: No results for input(s): CKTOTAL, CKMB, CKMBINDEX, TROPONINI in the last 168 hours. BNP (last 3 results) No results for input(s): PROBNP in the last 8760 hours. HbA1C: Recent Labs    06/26/21 0427  HGBA1C 5.2  5.2   CBG: No results for input(s): GLUCAP in the last 168 hours. Lipid Profile: Recent Labs    06/26/21 0427  CHOL 267*  HDL 49  LDLCALC 188*  TRIG 152*  CHOLHDL 5.4   Thyroid Function Tests: Recent Labs    06/25/21 2250  TSH 1.907   Anemia Panel: Recent Labs    06/27/21 0506  VITAMINB12 684   Sepsis Labs: No results for input(s): PROCALCITON, LATICACIDVEN in the last 168 hours.  Recent Results (from the past 240 hour(s))  Resp Panel by RT-PCR (Flu A&B, Covid) Nasopharyngeal Swab     Status: None   Collection Time: 06/26/21 12:20 AM   Specimen: Nasopharyngeal Swab; Nasopharyngeal(NP) swabs in vial transport medium  Result Value Ref Range Status   SARS Coronavirus 2 by RT PCR NEGATIVE NEGATIVE Final    Comment: (NOTE) SARS-CoV-2 target  nucleic acids are NOT DETECTED.  The SARS-CoV-2 RNA is generally detectable in upper respiratory specimens during the acute phase of infection. The lowest concentration of SARS-CoV-2 viral copies this assay can detect is 138 copies/mL. A negative result does not preclude SARS-Cov-2 infection and should not be used as the sole basis for treatment or other patient management decisions. A negative result may occur with  improper specimen collection/handling, submission of specimen other than nasopharyngeal swab, presence of viral mutation(s) within the areas targeted by this assay, and inadequate number of viral copies(<138 copies/mL). A negative result must be combined with clinical observations, patient history, and epidemiological information. The expected result is Negative.  Fact Sheet for Patients:  EntrepreneurPulse.com.au  Fact Sheet for Healthcare Providers:  IncredibleEmployment.be  This test is no t yet approved or cleared by the [place]  States FDA and  has been authorized for detection and/or diagnosis of SARS-CoV-2 by FDA under an Emergency Use Authorization (EUA). This EUA will remain  in effect (meaning this test can be used) for the duration of the COVID-19 declaration under Section 564(b)(1) of the Act, 21 U.S.C.section 360bbb-3(b)(1), unless the authorization is terminated  or revoked sooner.       Influenza A by PCR NEGATIVE NEGATIVE Final   Influenza B by PCR NEGATIVE NEGATIVE Final    Comment: (NOTE) The Xpert Xpress SARS-CoV-2/FLU/RSV plus assay is intended as an aid in the diagnosis of influenza from Nasopharyngeal swab specimens and should not be used as a sole basis for treatment. Nasal washings and aspirates are unacceptable for Xpert Xpress SARS-CoV-2/FLU/RSV testing.  Fact Sheet for Patients: EntrepreneurPulse.com.au  Fact Sheet for Healthcare  Providers: IncredibleEmployment.be  This test is not yet approved or cleared by the [place] FDA and has been authorized for detection and/or diagnosis of SARS-CoV-2 by FDA under an Emergency Use Authorization (EUA). This EUA will remain in effect (meaning this test can be used) for the duration of the COVID-19 declaration under Section 564(b)(1) of the Act, 21 U.S.C. section 360bbb-3(b)(1), unless the authorization is terminated or revoked.  Performed at [facility], [address] [place], [place] [address]          Radiology Studies: EEG adult  Result Date: Jul 25, 2021 Phillips Odor, MD     2021-07-25  6:26 PM Owings A. [name], MD     [email or web address]       HISTORY: The patient is 3 and presents with altered mental status. 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 MEDICATIONS: Current Facility-Administered Medications:   acetaminophen (TYLENOL) tablet 650 mg, 650 mg, Oral, Q4H PRN **OR** acetaminophen (TYLENOL) 160 MG/5ML solution 650 mg, 650 mg, Per Tube, Q4H PRN **OR** acetaminophen (TYLENOL) suppository 650 mg, 650 mg, Rectal, Q4H PRN, Zierle-Ghosh, Asia B, DO   amLODipine (NORVASC) tablet 5 mg, 5 mg, Oral, Daily, Garyn Arlotta Chirag, MD, 5 mg at 2021/07/25 1126   aspirin EC tablet 81 mg, 81 mg, Oral, Daily, Zierle-Ghosh, Asia B, DO, 81 mg at 07-25-21 1124   atorvastatin (LIPITOR) tablet 40 mg, 40 mg, Oral, Daily, Wendle Kina Chirag, MD, 40 mg at 07-25-21 1125   brimonidine (ALPHAGAN) 0.2 % ophthalmic solution 1 drop, 1 drop, Both Eyes, BID, 1 drop at 07/25/2021 1212 **AND** timolol (TIMOPTIC) 0.5 % ophthalmic solution 1 drop, 1 drop, Both Eyes, BID, Hall, Scott A, RPH, 1 drop at 2021/07/25 1217   clopidogrel (PLAVIX) tablet 75 mg, 75 mg, Oral, Q breakfast, [place], Kofi, MD, 75 mg at 07-25-2021 1125   dorzolamide (TRUSOPT) 2 % ophthalmic solution 1 drop, 1 drop, Both Eyes, BID, Fenna Semel Chirag, MD, 1 drop at  07/25/21 1221   escitalopram (LEXAPRO) tablet 20 mg, 20 mg, Oral, Daily, Zierle-Ghosh, Asia B, DO, 20 mg at Jul 25, 2021 1123   fluorometholone (FML) 0.1 % ophthalmic suspension 1 drop, 1 drop, Right Eye, QID, Mikail Goostree Chirag, MD, 1 drop at 07-25-2021 1219   haloperidol (HALDOL) tablet 1 mg, 1 mg, Oral, Q6H PRN **OR** haloperidol lactate (HALDOL) injection 2 mg, 2 mg, Intramuscular, Q6H PRN, Eamonn Sermeno Chirag, MD   heparin injection 5,000 Units, 5,000 Units, Subcutaneous, Q8H, Zierle-Ghosh, Asia B, DO, 5,000 Units at 07-25-21 0518   hydrALAZINE (APRESOLINE) injection 10 mg, 10 mg, Intravenous, Q4H PRN, Dominga Mcduffie Chirag, MD, 10 mg at 06/26/21 1136   ipratropium-albuterol (DUONEB) 0.5-2.5 (3) MG/3ML nebulizer  solution 3 mL, 3 mL, Nebulization, Q4H PRN, Arsenia Goracke Chirag, MD   ketorolac (ACULAR) 0.5 % ophthalmic solution 1 drop, 1 drop, Right Eye, Daily, Elowen Debruyn Chirag, MD   latanoprost (XALATAN) 0.005 % ophthalmic solution 1 drop, 1 drop, Both Eyes, QHS, Peyten Weare Chirag, MD, 1 drop at 06/26/21 2303   lisinopril (ZESTRIL) tablet 10 mg, 10 mg, Oral, Daily, Aleksey Newbern Chirag, MD, 10 mg at 06/27/21 1125   metoprolol tartrate (LOPRESSOR) injection 5 mg, 5 mg, Intravenous, Q4H PRN, Lupe Bonner Chirag, MD   Netarsudil Dimesylate 0.02 % SOLN 1 drop, 1 drop, Both Eyes, QHS, Conley Delisle Chirag, MD   predniSONE (DELTASONE) tablet 10 mg, 10 mg, Oral, Q breakfast, Zierle-Ghosh, Asia B, DO, 10 mg at 06/27/21 1214   senna-docusate (Senokot-S) tablet 1 tablet, 1 tablet, Oral, QHS PRN, Zierle-Ghosh, Asia B, DO   traZODone (DESYREL) tablet 50 mg, 50 mg, Oral, QHS PRN, Idris Edmundson Chirag, MD ANALYSIS: A 16 channel recording using standard 10 20 measurements is conducted for 23 minutes.  The background activity gets as high as 6-7 hertz. There is beta activity observed in the frontal areas. Most of the recording occurs during sleep with K complexes, spindles and even slow-wave activity. There is no focal or lateral slowing.  There is no epileptiform activities noted. IMPRESSION:  1.  This recording of the awake and sleep states shows some mild to moderate global slowing. However, no epileptiform activities are noted. Kofi A. [name], M.D. Diplomate, Tax adviser of Psychiatry and Neurology ( Neurology).   ECHOCARDIOGRAM COMPLETE  Result Date: 06/27/2021    ECHOCARDIOGRAM REPORT   Patient Name:   TANEAH MASRI Date of Exam: 06/27/2021 Medical Rec #:  [id number]         Height:       65.0 in Accession #:    [id number]        Weight:       130.1 lb Date of Birth:  Jun 19, 1946          BSA:          1.648 m Patient Age:    32 years          BP:           168/101 mmHg Patient Gender: F                 HR:           77 bpm. Exam Location:  [facility] Procedure: 2D Echo, Cardiac Doppler and Color Doppler Indications:    TIA  History:        Patient has prior history of Echocardiogram examinations, most                 recent 12/12/2020. TIA; Risk Factors:Hypertension and                 Dyslipidemia. Pericardial effusion.  Sonographer:    Wenda Low Referring Phys: [id number] ASIA B Odell  1. Left ventricular ejection fraction, by estimation, is 60 to 65%. The left ventricle has normal function. The left ventricle has no regional wall motion abnormalities. There is mild left ventricular hypertrophy. Left ventricular diastolic parameters are consistent with Grade I diastolic dysfunction (impaired relaxation).  2. Right ventricular systolic function is normal. The right ventricular size is normal. Tricuspid regurgitation signal is inadequate for assessing PA pressure.  3. The mitral valve is normal in structure. Mild mitral valve regurgitation. No evidence of mitral stenosis.  4.  The aortic valve is tricuspid. Aortic valve regurgitation is not visualized. No aortic stenosis is present. FINDINGS  Left Ventricle: Left ventricular ejection fraction, by estimation, is 60 to 65%. The left ventricle has normal function. The  left ventricle has no regional wall motion abnormalities. The left ventricular internal cavity size was normal in size. There is  mild left ventricular hypertrophy. Left ventricular diastolic parameters are consistent with Grade I diastolic dysfunction (impaired relaxation). Normal left ventricular filling pressure. Right Ventricle: The right ventricular size is normal. No increase in right ventricular wall thickness. Right ventricular systolic function is normal. Tricuspid regurgitation signal is inadequate for assessing PA pressure. Left Atrium: Left atrial size was normal in size. Right Atrium: Right atrial size was normal in size. Pericardium: There is no evidence of pericardial effusion. Mitral Valve: The mitral valve is normal in structure. Mild mitral valve regurgitation. No evidence of mitral valve stenosis. MV peak gradient, 3.0 mmHg. The mean mitral valve gradient is 1.0 mmHg. Tricuspid Valve: The tricuspid valve is normal in structure. Tricuspid valve regurgitation is trivial. No evidence of tricuspid stenosis. Aortic Valve: The aortic valve is tricuspid. Aortic valve regurgitation is not visualized. No aortic stenosis is present. Aortic valve mean gradient measures 4.0 mmHg. Aortic valve peak gradient measures 7.4 mmHg. Aortic valve area, by VTI measures 1.63 cm. Pulmonic Valve: The pulmonic valve was not well visualized. Pulmonic valve regurgitation is not visualized. No evidence of pulmonic stenosis. Aorta: The aortic root is normal in size and structure. Venous: The inferior vena cava was not well visualized. IAS/Shunts: No atrial level shunt detected by color flow Doppler.  LEFT VENTRICLE PLAX 2D LVIDd:         4.20 cm     Diastology LVIDs:         2.90 cm     LV e' medial:   6.53 cm/s LV PW:         1.00 cm     LV E/e' medial: 7.1 LV IVS:        1.20 cm LVOT diam:     1.90 cm LV SV:         43 LV SV Index:   26 LVOT Area:     2.84 cm  LV Volumes (MOD) LV vol d, MOD A2C: 59.7 ml LV vol d, MOD A4C:  69.4 ml LV vol s, MOD A2C: 32.4 ml LV vol s, MOD A4C: 31.4 ml LV SV MOD A2C:     27.3 ml LV SV MOD A4C:     69.4 ml LV SV MOD BP:      35.6 ml RIGHT VENTRICLE RV Basal diam:  2.90 cm RV Mid diam:    2.90 cm RV S prime:     10.00 cm/s TAPSE (M-mode): 1.8 cm LEFT ATRIUM             Index       RIGHT ATRIUM           Index LA diam:        2.70 cm 1.64 cm/m  RA Area:     13.30 cm LA Vol (A2C):   36.7 ml 22.28 ml/m RA Volume:   28.70 ml  17.42 ml/m LA Vol (A4C):   32.2 ml 19.54 ml/m LA Biplane Vol: 34.7 ml 21.06 ml/m  AORTIC VALVE                   PULMONIC VALVE AV Area (Vmax):    1.68 cm  PV Vmax:       1.11 m/s AV Area (Vmean):   1.61 cm    PV Peak grad:  4.9 mmHg AV Area (VTI):     1.63 cm AV Vmax:           136.00 cm/s AV Vmean:          96.800 cm/s AV VTI:            0.263 m AV Peak Grad:      7.4 mmHg AV Mean Grad:      4.0 mmHg LVOT Vmax:         80.70 cm/s LVOT Vmean:        54.900 cm/s LVOT VTI:          0.151 m LVOT/AV VTI ratio: 0.57  AORTA Ao Root diam: 2.70 cm Ao Asc diam:  3.20 cm MITRAL VALVE MV Area (PHT): 3.61 cm    SHUNTS MV Area VTI:   2.48 cm    Systemic VTI:  0.15 m MV Peak grad:  3.0 mmHg    Systemic Diam: 1.90 cm MV Mean grad:  1.0 mmHg MV Vmax:       0.86 m/s MV Vmean:      47.8 cm/s MV Decel Time: 210 msec MV E velocity: 46.40 cm/s MV A velocity: 77.90 cm/s MV E/A ratio:  0.60 Carlyle Dolly MD Electronically signed by Carlyle Dolly MD Signature Date/Time: 06/27/2021/12:51:49 PM    Final         Scheduled Meds:  amLODipine  5 mg Oral Daily   aspirin EC  81 mg Oral Daily   atorvastatin  40 mg Oral Daily   brimonidine  1 drop Both Eyes BID   And   timolol  1 drop Both Eyes BID   clopidogrel  75 mg Oral Q breakfast   dorzolamide  1 drop Both Eyes BID   escitalopram  20 mg Oral Daily   fluorometholone  1 drop Right Eye QID   ketorolac  1 drop Right Eye Daily   latanoprost  1 drop Both Eyes QHS   lisinopril  10 mg Oral Daily   Netarsudil Dimesylate  1 drop Both Eyes  QHS   predniSONE  10 mg Oral Q breakfast   Continuous Infusions:     LOS: 2 days   Time spent= 35 mins    Lucresha Dismuke Arsenio Loader, MD [facility]  If 7PM-7AM, please contact night-coverage  06/28/2021, 10:48 AM

## 2021-06-28 NOTE — Progress Notes (Signed)
Pt off floor to go for LP.

## 2021-06-28 NOTE — Progress Notes (Addendum)
Mckenzie A. [name], MD     [email or web address]          Mckenzie [name] is an 75 y.o. female.   Assessment/Plan: Unexplained encephalopathy:  Suspect meds - detrol last added 06-2021 but also consider actemra. 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 Suggest 2 day course of high dose steroids and FU with maintenance dose of about 60 mg then taper. FU w rheumatology     Quit confused still but less hallucinations and agitations; slept better last pm    Objective: Vital signs in last 24 hours: Temp:  [98 F (36.7 C)-99.1 F (37.3 C)] 99.1 F (37.3 C) (09/22 0901) Pulse Rate:  [78-89] 88 (09/22 1410) Resp:  [18-20] 18 (09/22 1410) BP: (126-150)/(54-76) 126/63 (09/22 1410) SpO2:  [95 %-97 %] 95 % (09/22 1410)  GENERAL:  She is awake and alert and cooperates with evaluation. She is a little restless.   HEENT:  Neck is supple no trauma noted.   ABDOMEN: soft   EXTREMITIES: No edema    BACK: normal   SKIN:   Several petechial hemorrhage noted in the upper lower extremities especially the legs. Otherwise no rashes noted    MENTAL STATUS:  She is awake and cooperates with evaluation. She thinks sees at her daughter's home. She is not oriented to time. She is not oriented to why she is in the hospital. She does follow commands and speech is normal.   CRANIAL NERVES: Pupils are equal, round and reactive to light and accomodation; extra ocular movements are full, there is no significant nystagmus; visual fields are full; upper and lower facial muscles are normal in strength and symmetric, there is no flattening of the nasolabial folds; tongue is midline; uvula is midline; shoulder elevation is normal.   MOTOR: Normal tone, bulk and strength; no pronator drift.      Intake/Output from previous day: 09/21 0701 - 09/22 0700 In: 360 [P.O.:360] Out: 800 [Urine:800] Intake/Output this shift: Total I/O In:  200 [P.O.:200] Out: 400 [Urine:400] Nutritional status:  Diet Order             Diet Heart Room service appropriate? Yes; Fluid consistency: Thin  Diet effective now                    Lab Results: Results for orders placed or performed during the hospital encounter of 06/25/21 (from the past 48 hour(s))  Basic metabolic panel     Status: Abnormal   Collection Time: 06/27/21  5:06 AM  Result Value Ref Range   Sodium 137 135 - 145 mmol/L   Potassium 3.3 (L) 3.5 - 5.1 mmol/L   Chloride 103 98 - 111 mmol/L   CO2 25 22 - 32 mmol/L   Glucose, Bld 107 (H) 70 - 99 mg/dL    Comment: Glucose reference range applies only to samples taken after fasting for at least 8 hours.   BUN 13 8 - 23 mg/dL   Creatinine, Ser 0.40 (L) 0.44 - 1.00 mg/dL   Calcium 9.2 8.9 - 10.3 mg/dL   GFR, Estimated >60 >60 mL/min    Comment: (NOTE) Calculated using the CKD-EPI Creatinine Equation (2021)    Anion gap 9 5 - 15    Comment: Performed at [facility], [address], [place], [place] [address]  CBC     Status: None   Collection Time: 06/27/21  5:06 AM  Result Value Ref Range   WBC  8.4 4.0 - 10.5 K/uL   RBC 4.61 3.87 - 5.11 MIL/uL   Hemoglobin 14.1 12.0 - 15.0 g/dL   HCT 43.5 36.0 - 46.0 %   MCV 94.4 80.0 - 100.0 fL   MCH 30.6 26.0 - 34.0 pg   MCHC 32.4 30.0 - 36.0 g/dL   RDW 13.2 11.5 - 15.5 %   Platelets 238 150 - 400 K/uL   nRBC 0.0 0.0 - 0.2 %    Comment: Performed at [facility], [address], [place], [place] [address]  Magnesium     Status: None   Collection Time: 06/27/21  5:06 AM  Result Value Ref Range   Magnesium 2.0 1.7 - 2.4 mg/dL    Comment: Performed at [facility], [address], [place], [place] [address]  Vitamin B12     Status: None   Collection Time: 06/27/21  5:06 AM  Result Value Ref Range   Vitamin B-12 684 180 - 914 pg/mL    Comment: (NOTE) This assay is not validated for testing neonatal or myeloproliferative syndrome specimens for Vitamin B12  levels. Performed at [facility], [address], [place], [place] [address]   Homocysteine     Status: None   Collection Time: 06/27/21  5:06 AM  Result Value Ref Range   Homocysteine 7.3 0.0 - 19.2 umol/L    Comment: (NOTE) Performed At: [facility] [place], [place] [id number] Rush Farmer MD YC:[id number]   Basic metabolic panel     Status: Abnormal   Collection Time: 06/28/21  5:26 AM  Result Value Ref Range   Sodium 134 (L) 135 - 145 mmol/L   Potassium 3.5 3.5 - 5.1 mmol/L   Chloride 104 98 - 111 mmol/L   CO2 23 22 - 32 mmol/L   Glucose, Bld 102 (H) 70 - 99 mg/dL    Comment: Glucose reference range applies only to samples taken after fasting for at least 8 hours.   BUN 23 8 - 23 mg/dL   Creatinine, Ser 0.47 0.44 - 1.00 mg/dL   Calcium 8.8 (L) 8.9 - 10.3 mg/dL   GFR, Estimated >60 >60 mL/min    Comment: (NOTE) Calculated using the CKD-EPI Creatinine Equation (2021)    Anion gap 7 5 - 15    Comment: Performed at [facility], [address] [place], [place] [address]  CBC     Status: None   Collection Time: 06/28/21  5:26 AM  Result Value Ref Range   WBC 8.2 4.0 - 10.5 K/uL   RBC 4.24 3.87 - 5.11 MIL/uL   Hemoglobin 13.1 12.0 - 15.0 g/dL   HCT 39.9 36.0 - 46.0 %   MCV 94.1 80.0 - 100.0 fL   MCH 30.9 26.0 - 34.0 pg   MCHC 32.8 30.0 - 36.0 g/dL   RDW 13.2 11.5 - 15.5 %   Platelets 246 150 - 400 K/uL   nRBC 0.0 0.0 - 0.2 %    Comment: Performed at [facility], [address] [place], [place] [address]  Magnesium     Status: None   Collection Time: 06/28/21  5:26 AM  Result Value Ref Range   Magnesium 2.0 1.7 - 2.4 mg/dL    Comment: Performed at [facility], [address], [place], [place] [address]  Protein and glucose, CSF     Status: Abnormal   Collection Time: 06/28/21  1:35 PM  Result Value Ref Range   Glucose, CSF 68 40 - 70 mg/dL  Total  Protein, CSF 80 (H) 15 - 45 mg/dL    Comment: Performed at [facility], [address], [place], [place] [address]  CSF cell count with differential     Status: Abnormal   Collection Time: 06/28/21  1:35 PM  Result Value Ref Range   Tube # 4    Color, CSF COLORLESS COLORLESS   Appearance, CSF CLEAR CLEAR   Supernatant COLORLESS    RBC Count, CSF 1 (H) 0 /cu mm   WBC, CSF 1 0 - 5 /cu mm   Other Cells, CSF TOO FEW TO COUNT, SMEAR AVAILABLE FOR REVIEW     Comment: FEW MONOCYTES,OCCASIONAL LYMPHOCYTES Performed at [facility], [address] [place], [place] [address]     Lipid Panel Recent Labs    06/26/21 0427  CHOL 267*  TRIG 152*  HDL 49  CHOLHDL 5.4  VLDL 30  LDLCALC 188*     Studies/Results:   Medications:  Scheduled Meds:  amLODipine  5 mg Oral Daily   aspirin EC  81 mg Oral Daily   atorvastatin  40 mg Oral Daily   brimonidine  1 drop Both Eyes BID   And   timolol  1 drop Both Eyes BID   clopidogrel  75 mg Oral Q breakfast   dorzolamide  1 drop Both Eyes BID   escitalopram  20 mg Oral Daily   fluorometholone  1 drop Right Eye QID   ketorolac  1 drop Right Eye Daily   latanoprost  1 drop Both Eyes QHS   lisinopril  10 mg Oral Daily   Netarsudil Dimesylate  1 drop Both Eyes QHS   predniSONE  10 mg Oral Q breakfast   Continuous Infusions: PRN Meds:.acetaminophen **OR** acetaminophen (TYLENOL) oral liquid 160 mg/5 mL **OR** acetaminophen, haloperidol **OR** haloperidol lactate, hydrALAZINE, ipratropium-albuterol, metoprolol tartrate, senna-docusate, traZODone     LOS: 2 days   Seema Blum A. Merlene [name], M.D.  Diplomate, Tax adviser of Psychiatry and Neurology ( Neurology).

## 2021-06-28 NOTE — Procedures (Signed)
Preprocedure Dx: AMS Postprocedure Dx: AMS Procedure:  Fluoroscopically guided lumbar puncture Radiologist:  Thornton Papas Anesthesia:  4 ml of 1% lidocaine Specimen:  10 ml CSF, clear colorless EBL:   < 1 ml Opening pressure: 9 cm G5Q Complications:  None

## 2021-06-28 NOTE — Progress Notes (Signed)
Patient tolerated lumbar puncture procedure well today. Labs obtained and sent to lab for processing at this time. PT returned to inpatient bed assignment at this time via hospital bed with NAD and RN and pt's husband voiced understanding of post procedure instructions.

## 2021-06-28 NOTE — Telephone Encounter (Signed)
Closed

## 2021-06-29 LAB — CBC
HCT: 40.3 % (ref 36.0–46.0)
Hemoglobin: 13.2 g/dL (ref 12.0–15.0)
MCH: 31.1 pg (ref 26.0–34.0)
MCHC: 32.8 g/dL (ref 30.0–36.0)
MCV: 95 fL (ref 80.0–100.0)
Platelets: 234 10*3/uL (ref 150–400)
RBC: 4.24 MIL/uL (ref 3.87–5.11)
RDW: 13.2 % (ref 11.5–15.5)
WBC: 7.2 10*3/uL (ref 4.0–10.5)
nRBC: 0 % (ref 0.0–0.2)

## 2021-06-29 LAB — BASIC METABOLIC PANEL
Anion gap: 8 (ref 5–15)
BUN: 21 mg/dL (ref 8–23)
CO2: 24 mmol/L (ref 22–32)
Calcium: 9.1 mg/dL (ref 8.9–10.3)
Chloride: 105 mmol/L (ref 98–111)
Creatinine, Ser: 0.58 mg/dL (ref 0.44–1.00)
GFR, Estimated: >60 mL/min (ref >60)
Glucose, Bld: 153 mg/dL — ABNORMAL HIGH (ref 70–99)
Potassium: 4.3 mmol/L (ref 3.5–5.1)
Sodium: 137 mmol/L (ref 135–145)

## 2021-06-29 LAB — VDRL, CSF: VDRL Quant, CSF: NONREACTIVE

## 2021-06-29 LAB — MAGNESIUM: Magnesium: 2.1 mg/dL (ref 1.7–2.4)

## 2021-06-29 NOTE — Care Management Important Message (Signed)
Important Message  Patient Details  Name: Mckenzie [name] MRN: [id number] Date of Birth: [birth year]   Medicare Important Message Given:  Yes     Tommy Medal 06/29/2021, 11:57 AM

## 2021-06-29 NOTE — Progress Notes (Signed)
Physical Therapy Treatment Patient Details Name: Mckenzie [name] MRN: [id number] DOB: [birth year] Today's Date: 06/29/2021   History of Present Illness Mckenzie [name]  is a 75 y.o. female, with history of depression, high cholesterol, hypertension, temporal arteritis, polymyalgia, stroke, and more presents the ED with a chief complaint of slurred speech, confusion, right arm weakness.  Husband is at bedside and provides most of the history because patient is confused about the event and still remains mildly confused now.  He reports that she was unsteady when he was putting her to bed around 9 PM, she could not raise her right hand, and she had slurred speech.  He reports that she was confused and was talking about calling people who are deceased for some time.  She does have a history of 2 strokes, and he thought maybe she is having another stroke.  Her symptoms lasted approximately 30 minutes, by the time EMS got there she was mostly back to baseline.  Her right arm strength was normal again, her speech was normal again.  Patient does still remain slightly confused.  She reports that she had surgery on her eyes couple days ago, husband is there telling her that that did not happen and that she is confused.  She was post to see rheumatologist today.  There was some concern that she was having withdrawal from decreasing her prednisone too quickly.  They were trying to wean her off and they recently decreased her in a stepwise approach from 50 mg to 7-1/2 mg.  Patient did have nausea and vomiting yesterday.  She was refusing to eat or drink.  She has been fatigued.  She is complaining of aching all over to her husband, but she tells me she is not hurting at all.  He reports that she is still hypersensitive and even when he checks blood pressure she screams in pain.  That has been going on since January when she was diagnosed with polymyalgia.  Patient is able to spell her name and the year, but she thinks she  is in the buy right.    PT Comments    Patient's husband present for session. Patient transitions to seated with cueing for sequencing and assist for weakness. Patient transfers to standing with use of RW, cueing, and assist. Patient unsteady upon standing and with ambulation. Patient given frequent verbal and tactile cueing for sequencing and RW navigation with poor carry over. Patient tending to drag RLE with ambulation and exhibit head and trunk flexed forward and to the right while using RW despite frequent cueing for posture. Patient returns to seated EOB and demonstrates poor balance with R and posterior truncal lean. Patient given frequent cuing for bed mobility with fair/poor carry over and is assisted back to bed. Spoke with nursing on patient's symptoms and functional status who went to assess patient further. Changing recommendation to SNF/ supervision due to decline in performance compared to prior sessions. Patient will benefit from continued physical therapy in hospital and recommended venue below to increase strength, balance, endurance for safe ADLs and gait.   Recommendations for follow up therapy are one component of a multi-disciplinary discharge planning process, led by the attending physician.  Recommendations may be updated based on patient status, additional functional criteria and insurance authorization.  Follow Up Recommendations  Supervision for mobility/OOB;Supervision - Intermittent;SNF     Equipment Recommendations  None recommended by PT    Recommendations for Other Services       Precautions / Restrictions Precautions  Precautions: Fall Restrictions Weight Bearing Restrictions: No     Mobility  Bed Mobility Overal bed mobility: Needs Assistance Bed Mobility: Supine to Sit;Sit to Supine     Supine to sit: Min assist Sit to supine: Min assist;Mod assist   General bed mobility comments: increased time, frequent cueing    Transfers Overall transfer  level: Needs assistance Equipment used: Rolling walker (2 wheeled) Transfers: Sit to/from Stand Sit to Stand: Min assist;Mod assist Stand pivot transfers: Mod assist       General transfer comment: cueing for sequencing  Ambulation/Gait Ambulation/Gait assistance: Mod assist Gait Distance (Feet): 10 Feet Assistive device: Rolling walker (2 wheeled) Gait Pattern/deviations: Decreased step length - right;Decreased step length - left;Decreased stride length;Trunk flexed Gait velocity: decreased   General Gait Details: slow, labored cadence with use of RW, cueing and assist for sequencing and RW use, fequently dragging feet, poor foot clearance   Stairs             Wheelchair Mobility    Modified Rankin (Stroke Patients Only)       Balance Overall balance assessment: Needs assistance Sitting-balance support: Feet supported;No upper extremity supported Sitting balance-Leahy Scale: Poor Sitting balance - Comments: seated at EOB Postural control: Right lateral lean;Posterior lean Standing balance support: During functional activity;Bilateral upper extremity supported Standing balance-Leahy Scale: Poor Standing balance comment: fair/poor using RW                            Cognition Arousal/Alertness: Awake/alert Behavior During Therapy: WFL for tasks assessed/performed Overall Cognitive Status: History of cognitive impairments - at baseline                                        Exercises      General Comments        Pertinent Vitals/Pain Pain Assessment: No/denies pain    Home Living                      Prior Function            PT Goals (current goals can now be found in the care plan section) Acute Rehab PT Goals Patient Stated Goal: return home with family to assist PT Goal Formulation: With patient/family Time For Goal Achievement: 07/04/21 Potential to Achieve Goals: Good Progress towards PT goals:  Progressing toward goals    Frequency    Min 3X/week      PT Plan Discharge plan needs to be updated    Co-evaluation              AM-PAC PT "6 Clicks" Mobility   Outcome Measure  Help needed turning from your back to your side while in a flat bed without using bedrails?: A Little Help needed moving from lying on your back to sitting on the side of a flat bed without using bedrails?: A Lot Help needed moving to and from a bed to a chair (including a wheelchair)?: A Lot Help needed standing up from a chair using your arms (e.g., wheelchair or bedside chair)?: A Lot Help needed to walk in hospital room?: A Lot Help needed climbing 3-5 steps with a railing? : A Lot 6 Click Score: 13    End of Session Equipment Utilized During Treatment: Gait belt Activity Tolerance: Patient tolerated treatment well;Patient limited by fatigue Patient left:  in bed;with family/visitor present;with call bell/phone within reach;with bed alarm set Nurse Communication: Mobility status;Other (comment) (patient's functional status/performance and current symptoms) PT Visit Diagnosis: Unsteadiness on feet (R26.81);Other abnormalities of gait and mobility (R26.89);Muscle weakness (generalized) (M62.81)     Time: 1219-7588 PT Time Calculation (min) (ACUTE ONLY): 20 min  Charges:  $Therapeutic Activity: 8-22 mins                     3:14 PM, 06/29/21 Mearl Latin PT, DPT Physical Therapist at [facility]

## 2021-06-29 NOTE — Progress Notes (Signed)
I was notified by the hospital staff that patient was having some facial droop therefore I evaluated the patient again.  Husband was present at bedside.  Patient's mentation was at baseline, she did not appear to have any facial droop.  Discussed with patient's RN, they did not notice any facial droop either.  No need for repeat CT head at this time.  Continue management as planned earlier.  Gerlean Ren MD

## 2021-06-29 NOTE — TOC Progression Note (Addendum)
Transition of Care [facility]) - Progression Note    Patient Details  Name: EDRIS SCHNECK MRN: [id number] Date of Birth: 05-02-1946  [facility] CM/SW Contact  Boneta Lucks, RN Phone Number: 06/29/2021, 1:56 PM  Clinical Narrative:   Patient is active with [facility] health. TOC reached out to [facility]. They need orders for HHPT. Requested orders from MD.   Addendum: PT went in to re-evaluate, patient did not do well. MD/RN notified. TOC spoke with [name] - daughter. They are unsure if they will choose SNF. She will need to discuss with her father. They have enough family close by to assist. They are more concerned with her worsening condition today. FL2 started, TOC to follow.

## 2021-06-29 NOTE — Progress Notes (Signed)
PROGRESS NOTE    Mckenzie [name]  TDD:[id number] DOB: [birth year] DOA: 06/25/2021 PCP: Loman Brooklyn, FNP   Brief Narrative:  75 year old with history of depression, HLD, HTN, temporal arteritis, polymyalgia, stroke came to the ED with complaints of slurred speech, confusion and right arm weakness.  The symptoms lasted for about 30 minutes.  Upon admission patient had CT of the head done which was unremarkable.  MRI of the brain was positive for acute CVA, neurology team was consulted as well.  No large vessel occlusion were noted.  Neurology recommended obtaining EEG, which was negative. LP was performed per neuro on 9/22 which was negative but due to concerns of possible autoimmune disease, started on high-dose steroids.   Assessment & Plan:   Active Problems:   Essential hypertension   Acute metabolic encephalopathy   Depression   TIA (transient ischemic attack)   Hypokalemia   Temporal arteritis (HCC)   Acute CVA (cerebrovascular accident) ([place])  Acute CVA, inferior left frontal lobe Acute encephalopathy likely following CVA with some delirium -Mentation has improved. -MRI brain without contrast and MRA head and neck without contrast is positive for acute CVA but negative for any acute LVO -Continue aspirin, defer addition of Plavix to neurology -Echocardiogram-EF 60 to 65%, grade 1 DD. -LDL 188.  TSH normal, A1c 5.2 -Statin -Neurology following - EEG-neg, LP performed per neurology 9/22 which was negative.  No cell cultures were ordered but low suspicion for infection.  Started on Solu-Medrol 1 g every 24 hours for 2 days thereafter on p.o. steroids.  Her second doses tonight. -UA and Ammonia - neg  Essential hypertension, uncontrolled. SBP >230 - Improved.  Continue lisinopril, Norvasc 5 mg daily.  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  Follows with rheumatology, family and patient seems to the Actemra is driving  her blood pressure significantly high.  She has rheumatology appointment outpatient on Monday  Depression - Lexapro    DVT prophylaxis: SCD's Start: 06/26/21 0138 Code Status: Full Family Communication: Daughter at bedside and also spoke with her daughter over the phone.   Dispo: The patient is from: Home              Anticipated d/c is to: Home              Patient currently is not medically stable to d/c.  Maintain hospital stay for IV Solu-Medrol today.  Hopefully home tomorrow on oral   Difficult to place patient No        Subjective: Patient is awake sitting up.  Daughter is at bedside, no complaints.  Patient's mentation is at baseline.  Review of Systems Otherwise negative except as per HPI, including: General: Denies fever, chills, night sweats or unintended weight loss. Resp: Denies cough, wheezing, shortness of breath. Cardiac: Denies chest pain, palpitations, orthopnea, paroxysmal nocturnal dyspnea. GI: Denies abdominal pain, nausea, vomiting, diarrhea or constipation GU: Denies dysuria, frequency, hesitancy or incontinence MS: Denies muscle aches, joint pain or swelling Neuro: Denies headache, neurologic deficits (focal weakness, numbness, tingling), abnormal gait Psych: Denies anxiety, depression, SI/HI/AVH Skin: Denies new rashes or lesions ID: Denies sick contacts, exotic exposures, travel   Examination: Constitutional: Not in acute distress Respiratory: Clear to auscultation bilaterally Cardiovascular: Normal sinus rhythm, no rubs Abdomen: Nontender nondistended good bowel sounds Musculoskeletal: No edema noted Skin: No rashes seen Neurologic: CN 2-12 grossly intact.  And nonfocal Psychiatric: Normal judgment and insight. Alert and oriented x 3. Normal  mood.   Objective: Vitals:   06/28/21 2108 06/29/21 0053 06/29/21 0524 06/29/21 0911  BP: (!) 144/80 114/64 (!) 145/101 (!) 140/59  Pulse: 86 77 86 73  Resp: 19 20 19 18   Temp: 98.5 F (36.9 C)  98.1 F (36.7 C) 98.2 F (36.8 C) 98.6 F (37 C)  TempSrc: Oral Oral Oral Oral  SpO2: 95% 95% 100% 100%  Weight:      Height:        Intake/Output Summary (Last 24 hours) at 06/29/2021 1214 Last data filed at 06/29/2021 0900 Gross per 24 hour  Intake 906 ml  Output 300 ml  Net 606 ml   Filed Weights   06/25/21 2244  Weight: 59 kg     Data Reviewed:   CBC: Recent Labs  Lab 06/25/21 2250 06/26/21 0427 06/27/21 0506 06/28/21 0526 06/29/21 0520  WBC 8.8 7.7 8.4 8.2 7.2  NEUTROABS 5.7  --   --   --   --   HGB 13.6 13.2 14.1 13.1 13.2  HCT 41.0 39.8 43.5 39.9 40.3  MCV 93.2 92.8 94.4 94.1 95.0  PLT 237 208 238 246 417   Basic Metabolic Panel: Recent Labs  Lab 06/25/21 2250 06/26/21 0427 06/27/21 0506 06/28/21 0526 06/29/21 0520  NA 135 136 137 134* 137  K 3.0* 3.0* 3.3* 3.5 4.3  CL 101 101 103 104 105  CO2 28 29 25 23 24   GLUCOSE 110* 105* 107* 102* 153*  BUN 20 16 13 23 21   CREATININE 0.62 0.55 0.40* 0.47 0.58  CALCIUM 8.8* 8.6* 9.2 8.8* 9.1  MG  --  2.3 2.0 2.0 2.1   GFR: Estimated Creatinine Clearance: 53.8 mL/min (by C-G formula based on SCr of 0.58 mg/dL). Liver Function Tests: Recent Labs  Lab 06/25/21 2250 06/26/21 0427  AST 18 18  ALT 21 20  ALKPHOS 184* 172*  BILITOT 0.8 0.8  PROT 6.3* 5.7*  ALBUMIN 3.8 3.4*   No results for input(s): LIPASE, AMYLASE in the last 168 hours. Recent Labs  Lab 06/25/21 2250  AMMONIA 9   Coagulation Profile: No results for input(s): INR, PROTIME in the last 168 hours. Cardiac Enzymes: No results for input(s): CKTOTAL, CKMB, CKMBINDEX, TROPONINI in the last 168 hours. BNP (last 3 results) No results for input(s): PROBNP in the last 8760 hours. HbA1C: No results for input(s): HGBA1C in the last 72 hours.  CBG: No results for input(s): GLUCAP in the last 168 hours. Lipid Profile: No results for input(s): CHOL, HDL, LDLCALC, TRIG, CHOLHDL, LDLDIRECT in the last 72 hours.  Thyroid Function Tests: No  results for input(s): TSH, T4TOTAL, FREET4, T3FREE, THYROIDAB in the last 72 hours.  Anemia Panel: Recent Labs    06/27/21 0506  [id number]   Sepsis Labs: No results for input(s): PROCALCITON, LATICACIDVEN in the last 168 hours.  Recent Results (from the past 240 hour(s))  Resp Panel by RT-PCR (Flu A&B, Covid) Nasopharyngeal Swab     Status: None   Collection Time: 06/26/21 12:20 AM   Specimen: Nasopharyngeal Swab; Nasopharyngeal(NP) swabs in vial transport medium  Result Value Ref Range Status   SARS Coronavirus 2 by RT PCR NEGATIVE NEGATIVE Final    Comment: (NOTE) SARS-CoV-2 target nucleic acids are NOT DETECTED.  The SARS-CoV-2 RNA is generally detectable in upper respiratory specimens during the acute phase of infection. The lowest concentration of SARS-CoV-2 viral copies this assay can detect is 138 copies/mL. A negative result does not preclude SARS-Cov-2 infection and should not be  used as the sole basis for treatment or other patient management decisions. A negative result may occur with  improper specimen collection/handling, submission of specimen other than nasopharyngeal swab, presence of viral mutation(s) within the areas targeted by this assay, and inadequate number of viral copies(<138 copies/mL). A negative result must be combined with clinical observations, patient history, and epidemiological information. The expected result is Negative.  Fact Sheet for Patients:  EntrepreneurPulse.com.au  Fact Sheet for Healthcare Providers:  IncredibleEmployment.be  This test is no t yet approved or cleared by the [place] FDA and  has been authorized for detection and/or diagnosis of SARS-CoV-2 by FDA under an Emergency Use Authorization (EUA). This EUA will remain  in effect (meaning this test can be used) for the duration of the COVID-19 declaration under Section 564(b)(1) of the Act, 21 U.S.C.section 360bbb-3(b)(1),  unless the authorization is terminated  or revoked sooner.       Influenza A by PCR NEGATIVE NEGATIVE Final   Influenza B by PCR NEGATIVE NEGATIVE Final    Comment: (NOTE) The Xpert Xpress SARS-CoV-2/FLU/RSV plus assay is intended as an aid in the diagnosis of influenza from Nasopharyngeal swab specimens and should not be used as a sole basis for treatment. Nasal washings and aspirates are unacceptable for Xpert Xpress SARS-CoV-2/FLU/RSV testing.  Fact Sheet for Patients: EntrepreneurPulse.com.au  Fact Sheet for Healthcare Providers: IncredibleEmployment.be  This test is not yet approved or cleared by the [place] FDA and has been authorized for detection and/or diagnosis of SARS-CoV-2 by FDA under an Emergency Use Authorization (EUA). This EUA will remain in effect (meaning this test can be used) for the duration of the COVID-19 declaration under Section 564(b)(1) of the Act, 21 U.S.C. section 360bbb-3(b)(1), unless the authorization is terminated or revoked.  Performed at [facility], [address], [place], [place] [address]          Radiology Studies: EEG adult  Result Date: 2021-07-09 Phillips Odor, MD     07/09/21  6:26 PM [place] A. [name], MD     [email or web address]       HISTORY: The patient is 17 and presents with altered mental status. 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 MEDICATIONS: Current Facility-Administered Medications:   acetaminophen (TYLENOL) tablet 650 mg, 650 mg, Oral, Q4H PRN **OR** acetaminophen (TYLENOL) 160 MG/5ML solution 650 mg, 650 mg, Per Tube, Q4H PRN **OR** acetaminophen (TYLENOL) suppository 650 mg, 650 mg, Rectal, Q4H PRN, Zierle-Ghosh, Asia B, DO   amLODipine (NORVASC) tablet 5 mg, 5 mg, Oral, Daily, Coty Student Chirag, MD, 5 mg at 09-Jul-2021 1126   aspirin EC tablet 81 mg, 81 mg, Oral, Daily, Zierle-Ghosh, Asia B, DO, 81 mg at 09-Jul-2021 1124    atorvastatin (LIPITOR) tablet 40 mg, 40 mg, Oral, Daily, Viren Lebeau Chirag, MD, 40 mg at 09-Jul-2021 1125   brimonidine (ALPHAGAN) 0.2 % ophthalmic solution 1 drop, 1 drop, Both Eyes, BID, 1 drop at July 09, 2021 1212 **AND** timolol (TIMOPTIC) 0.5 % ophthalmic solution 1 drop, 1 drop, Both Eyes, BID, Hall, Scott A, RPH, 1 drop at 09-Jul-2021 1217   clopidogrel (PLAVIX) tablet 75 mg, 75 mg, Oral, Q breakfast, [place], Kofi, MD, 75 mg at Jul 09, 2021 1125   dorzolamide (TRUSOPT) 2 % ophthalmic solution 1 drop, 1 drop, Both Eyes, BID, Meera Vasco Chirag, MD, 1 drop at Jul 09, 2021 1221   escitalopram (LEXAPRO) tablet 20 mg, 20 mg, Oral, Daily, Zierle-Ghosh, Asia B, DO, 20 mg at 07-09-21 1123   fluorometholone (FML) 0.1 %  ophthalmic suspension 1 drop, 1 drop, Right Eye, QID, Ymani Porcher Chirag, MD, 1 drop at 06/27/21 1219   haloperidol (HALDOL) tablet 1 mg, 1 mg, Oral, Q6H PRN **OR** haloperidol lactate (HALDOL) injection 2 mg, 2 mg, Intramuscular, Q6H PRN, Malini Flemings Chirag, MD   heparin injection 5,000 Units, 5,000 Units, Subcutaneous, Q8H, Zierle-Ghosh, Asia B, DO, 5,000 Units at 06/27/21 0518   hydrALAZINE (APRESOLINE) injection 10 mg, 10 mg, Intravenous, Q4H PRN, Vernie Vinciguerra Chirag, MD, 10 mg at 06/26/21 1136   ipratropium-albuterol (DUONEB) 0.5-2.5 (3) MG/3ML nebulizer solution 3 mL, 3 mL, Nebulization, Q4H PRN, Sache Sane Chirag, MD   ketorolac (ACULAR) 0.5 % ophthalmic solution 1 drop, 1 drop, Right Eye, Daily, Delita Chiquito Chirag, MD   latanoprost (XALATAN) 0.005 % ophthalmic solution 1 drop, 1 drop, Both Eyes, QHS, Zamaria Brazzle Chirag, MD, 1 drop at 06/26/21 2303   lisinopril (ZESTRIL) tablet 10 mg, 10 mg, Oral, Daily, Leston Schueller Chirag, MD, 10 mg at 06/27/21 1125   metoprolol tartrate (LOPRESSOR) injection 5 mg, 5 mg, Intravenous, Q4H PRN, Romelia Bromell Chirag, MD   Netarsudil Dimesylate 0.02 % SOLN 1 drop, 1 drop, Both Eyes, QHS, Shirlette Scarber Chirag, MD   predniSONE (DELTASONE) tablet 10 mg, 10 mg, Oral, Q breakfast,  Zierle-Ghosh, Asia B, DO, 10 mg at 06/27/21 1214   senna-docusate (Senokot-S) tablet 1 tablet, 1 tablet, Oral, QHS PRN, Zierle-Ghosh, Asia B, DO   traZODone (DESYREL) tablet 50 mg, 50 mg, Oral, QHS PRN, Shaul Trautman Chirag, MD ANALYSIS: A 16 channel recording using standard 10 20 measurements is conducted for 23 minutes.  The background activity gets as high as 6-7 hertz. There is beta activity observed in the frontal areas. Most of the recording occurs during sleep with K complexes, spindles and even slow-wave activity. There is no focal or lateral slowing. There is no epileptiform activities noted. IMPRESSION:  1.  This recording of the awake and sleep states shows some mild to moderate global slowing. However, no epileptiform activities are noted. Kofi A. [name], M.D. Diplomate, Tax adviser of Psychiatry and Neurology ( Neurology).   DG FLUORO GUIDED NEEDLE PLC ASPIRATION/INJECTION LOC  Result Date: 06/28/2021 CLINICAL DATA:  Altered mental status EXAM: DIAGNOSTIC LUMBAR PUNCTURE UNDER FLUOROSCOPIC GUIDANCE COMPARISON:  CT head 06/25/2021 FLUOROSCOPY TIME:  Fluoroscopy Time:  0 minutes 12 seconds Radiation Exposure Index (if provided by the fluoroscopic device): 1.6 mGy Number of Acquired Spot Images: 1 PROCEDURE: Procedure, benefits, and risks were discussed with the patient, including alternatives. Patient's questions were answered. Written informed consent was obtained. Timeout protocol followed. Patient placed prone. L4-L5 disc space was localized under fluoroscopy. Skin prepped and draped in usual sterile fashion. Skin and soft tissues anesthetized with 4 mL of 1% lidocaine. 22 gauge needle was advanced into the spinal canal where clear colorless CSF was encountered with an opening pressure of 9 cm H2O (LLD). 10 mL of CSF was obtained in 4 tubes for requested analysis. Procedure tolerated very well by patient without immediate complication. IMPRESSION: Successful lumbar puncture as above  Electronically Signed   By: Lavonia Dana M.D.   On: 06/28/2021 14:34        Scheduled Meds:  amLODipine  5 mg Oral Daily   aspirin EC  81 mg Oral Daily   atorvastatin  40 mg Oral Daily   brimonidine  1 drop Both Eyes BID   And   timolol  1 drop Both Eyes BID   clopidogrel  75 mg Oral Q breakfast  dorzolamide  1 drop Both Eyes BID   escitalopram  20 mg Oral Daily   fluorometholone  1 drop Right Eye QID   ketorolac  1 drop Right Eye Daily   latanoprost  1 drop Both Eyes QHS   lisinopril  10 mg Oral Daily   Netarsudil Dimesylate  1 drop Both Eyes QHS   predniSONE  10 mg Oral Q breakfast   Continuous Infusions:  methylPREDNISolone (SOLU-MEDROL) injection 1,000 mg (06/28/21 2352)      LOS: 3 days   Time spent= 35 mins    Khy Pitre Arsenio Loader, MD [facility]  If 7PM-7AM, please contact night-coverage  06/29/2021, 12:14 PM

## 2021-06-29 NOTE — Progress Notes (Signed)
Therapy informed this nurse that pt seemed different while ambulating today. Therapy stated that she was not following commands as she did in a prior assessment yesterday. This nursing completed NIH assessment. No change noted from pt baseline. Husband at bed side stated he "hasn't noticed any changes in behavior."

## 2021-06-30 LAB — BASIC METABOLIC PANEL
Anion gap: 9 (ref 5–15)
BUN: 22 mg/dL (ref 8–23)
CO2: 25 mmol/L (ref 22–32)
Calcium: 9.5 mg/dL (ref 8.9–10.3)
Chloride: 103 mmol/L (ref 98–111)
Creatinine, Ser: 0.48 mg/dL (ref 0.44–1.00)
GFR, Estimated: >60 mL/min (ref >60)
Glucose, Bld: 154 mg/dL — ABNORMAL HIGH (ref 70–99)
Potassium: 3.5 mmol/L (ref 3.5–5.1)
Sodium: 137 mmol/L (ref 135–145)

## 2021-06-30 LAB — CBC
HCT: 43.6 % (ref 36.0–46.0)
Hemoglobin: 14.4 g/dL (ref 12.0–15.0)
MCH: 31 pg (ref 26.0–34.0)
MCHC: 33 g/dL (ref 30.0–36.0)
MCV: 93.8 fL (ref 80.0–100.0)
Platelets: 310 10*3/uL (ref 150–400)
RBC: 4.65 MIL/uL (ref 3.87–5.11)
RDW: 13.2 % (ref 11.5–15.5)
WBC: 14.3 10*3/uL — ABNORMAL HIGH (ref 4.0–10.5)
nRBC: 0 % (ref 0.0–0.2)

## 2021-06-30 LAB — HEPATIC FUNCTION PANEL
ALT: 33 U/L (ref 0–44)
AST: 22 U/L (ref 15–41)
Albumin: 3.5 g/dL (ref 3.5–5.0)
Alkaline Phosphatase: 173 U/L — ABNORMAL HIGH (ref 38–126)
Bilirubin, Direct: 0.1 mg/dL (ref 0.0–0.2)
Indirect Bilirubin: 0.3 mg/dL (ref 0.3–0.9)
Total Bilirubin: 0.4 mg/dL (ref 0.3–1.2)
Total Protein: 6.3 g/dL — ABNORMAL LOW (ref 6.5–8.1)

## 2021-06-30 LAB — FOLATE: Folate: 30.7 ng/mL (ref >5.9)

## 2021-06-30 LAB — CK: Total CK: 40 U/L (ref 38–234)

## 2021-06-30 LAB — MAGNESIUM: Magnesium: 2.2 mg/dL (ref 1.7–2.4)

## 2021-06-30 LAB — AMMONIA: Ammonia: 13 umol/L (ref 9–35)

## 2021-06-30 MED ORDER — FLUOROMETHOLONE 0.1 % OP SUSP
1.0000 [drp] | Freq: Three times a day (TID) | OPHTHALMIC | Status: DC
  Administered 2021-06-30: 1 [drp] via OPHTHALMIC
  Filled 2021-06-30: qty 5

## 2021-06-30 MED ORDER — POTASSIUM CHLORIDE IN NACL 20-0.9 MEQ/L-% IV SOLN: INTRAVENOUS | Status: AC

## 2021-06-30 NOTE — Progress Notes (Addendum)
PROGRESS NOTE  OTTIE NEGLIA ZRA:[id number] DOB: [birth year] DOA: 06/25/2021 PCP: Loman Brooklyn, FNP  Brief History:  75 year old with history of depression, HLD, HTN, temporal arteritis, polymyalgia, stroke came to the ED with complaints of slurred speech, confusion and right arm weakness.  The symptoms lasted for about 30 minutes.  Upon admission patient had CT of the head done which was unremarkable.  MRI of the brain was positive for acute CVA, neurology team was consulted as well.  No large vessel occlusion were noted.  Neurology recommended obtaining EEG, which was negative. LP was performed per neuro on 9/22 which was negative but due to concerns of possible autoimmune disease, started on high-dose steroids.  She was given 1 gram solumedrol on 9/22 and 9/23.  Although she was less agitated, she remained pleasantly confused.  Assessment/Plan: Acute CVA, inferior left frontal lobe Acute encephalopathy likely following CVA with some delirium -Mentation has improved but remains pleasantly confused -MRI brain without contrast and MRA head and neck without contrast is positive for acute CVA but negative for any acute LVO -Continue aspirin, defer addition of Plavix to neurology -Echocardiogram-EF 60 to 65%, grade 1 DD. -LDL 188.  TSH normal, A1c 5.2 -Statin -Neurology following - EEG-neg, LP performed per neurology 9/22 which was negative.  No cell cultures were ordered but low suspicion for infection.  Started on Solu-Medrol 1 g every 24 hours for 2 days thereafter on p.o. steroids.  Her second doses tonight. -UA and Ammonia - neg -her delirium at this point is multifactorial including day-night reversion, hospital delirium, IV steroids, possible with underlying degree of cognitive impairment (?vascular dementia with prior ischemic infarcts and evidence of sm vessel disease) -she would benefit to having neuropsychiatric testing in future -continue ASA and plavix -VDRL,  cryptococcus--neg -ESR 15 -CRP 0.5   Essential hypertension, uncontrolled. SBP >230 - Improved.  Continue lisinopril, Norvasc 5 mg daily.  IV hydralazine as needed   Polymyalgia rheumatica - On outpatient prednisone and getting Actemra.  Follows with rheumatology, family and patient seems to the Actemra is driving her blood pressure significantly high.  She has rheumatology appointment outpatient on Monday   Depression - Lexapro    Status is: Inpatient  Remains inpatient appropriate because:Altered mental status  Dispo: The patient is from: Home              Anticipated d/c is to: Home              Patient currently is not medically stable to d/c.   Difficult to place patient No        Family Communication:   spouse at bedside; daughter on phone  Consultants:  neurology  Code Status:  FULL   DVT Prophylaxis:  SCDs   Procedures: As Listed in Progress Note Above  Antibiotics: None   Total time spent 35 minutes.  Greater than 50% spent face to face counseling and coordinating care.    Subjective: Patient is pleasantly confused.  Denies cp, sob.  No reports of vomiting, diarrhea, resp distress, uncontrolled pain  Objective: Vitals:   06/29/21 0911 06/29/21 1330 06/29/21 2113 06/30/21 0551  BP: (!) 140/59 (!) 116/58 (!) 151/81 (!) 191/86  Pulse: 73 80 89 83  Resp: _0 Temp: 98.6 F (37 C) 98 F (36.7 C) 98.4 F (36.9 C) 97.6 F (36.4 C)  TempSrc: Oral Oral Oral Oral  SpO2: 100% 95% 98% 98%  Weight:  Height:        Intake/Output Summary (Last 24 hours) at 06/30/2021 1259 Last data filed at 06/30/2021 0900 Gross per 24 hour  Intake 1160 ml  Output 500 ml  Net 660 ml   Weight change:  Exam:  General:  Pt is alert, follows commands appropriately, not in acute distress HEENT: No icterus, No thrush, No neck mass, [place]/AT Cardiovascular: RRR, S1/S2, no rubs, no gallops Respiratory: bibasilar crackles. No wheeze Abdomen: Soft/+BS, non  tender, non distended, no guarding Extremities: No edema, No lymphangitis, No petechiae, No rashes, no synovitis   Data Reviewed: I have personally reviewed following labs and imaging studies Basic Metabolic Panel: Recent Labs  Lab 06/26/21 0427 06/27/21 0506 06/28/21 0526 06/29/21 0520 06/30/21 0532  NA 136 137 134* 137 137  K 3.0* 3.3* 3.5 4.3 3.5  CL 101 103 104 105 103  CO2 _0 GLUCOSE 105* 107* 102* 153* 154*  BUN _1 CREATININE 0.55 0.40* 0.47 0.58 0.48  CALCIUM 8.6* 9.2 8.8* 9.1 9.5  MG 2.3 2.0 2.0 2.1 2.2   Liver Function Tests: Recent Labs  Lab 06/25/21 2250 06/26/21 0427  AST 18 18  ALT 21 20  ALKPHOS 184* 172*  BILITOT 0.8 0.8  PROT 6.3* 5.7*  ALBUMIN 3.8 3.4*   No results for input(s): LIPASE, AMYLASE in the last 168 hours. Recent Labs  Lab 06/25/21 2250  AMMONIA 9   Coagulation Profile: No results for input(s): INR, PROTIME in the last 168 hours. CBC: Recent Labs  Lab 06/25/21 2250 06/26/21 0427 06/27/21 0506 06/28/21 0526 06/29/21 0520 06/30/21 0532  WBC 8.8 7.7 8.4 8.2 7.2 14.3*  NEUTROABS 5.7  --   --   --   --   --   HGB 13.6 13.2 14.1 13.1 13.2 14.4  HCT 41.0 39.8 43.5 39.9 40.3 43.6  MCV 93.2 92.8 94.4 94.1 95.0 93.8  PLT 237 208 238 246 234 310   Cardiac Enzymes: No results for input(s): CKTOTAL, CKMB, CKMBINDEX, TROPONINI in the last 168 hours. BNP: Invalid input(s): POCBNP CBG: No results for input(s): GLUCAP in the last 168 hours. HbA1C: No results for input(s): HGBA1C in the last 72 hours. Urine analysis:    Component Value Date/Time   COLORURINE YELLOW 06/26/2021 0219   APPEARANCEUR CLEAR 06/26/2021 0219   APPEARANCEUR Clear 11/21/2020 1644   LABSPEC 1.018 06/26/2021 0219   PHURINE 5.0 06/26/2021 0219   GLUCOSEU NEGATIVE 06/26/2021 0219   HGBUR NEGATIVE 06/26/2021 0219   BILIRUBINUR NEGATIVE 06/26/2021 0219   BILIRUBINUR Negative 11/21/2020 1644   KETONESUR 5 (A) 06/26/2021 0219    PROTEINUR 30 (A) 06/26/2021 0219   UROBILINOGEN negative 05/13/2015 0827   NITRITE NEGATIVE 06/26/2021 0219   LEUKOCYTESUR NEGATIVE 06/26/2021 0219   Sepsis Labs: _2 (procalcitonin:4,lacticidven:4) ) Recent Results (from the past 240 hour(s))  Resp Panel by RT-PCR (Flu A&B, Covid) Nasopharyngeal Swab     Status: None   Collection Time: 06/26/21 12:20 AM   Specimen: Nasopharyngeal Swab; Nasopharyngeal(NP) swabs in vial transport medium  Result Value Ref Range Status   SARS Coronavirus 2 by RT PCR NEGATIVE NEGATIVE Final    Comment: (NOTE) SARS-CoV-2 target nucleic acids are NOT DETECTED.  The SARS-CoV-2 RNA is generally detectable in upper respiratory specimens during the acute phase of infection. The lowest concentration of SARS-CoV-2 viral copies this assay can detect is 138 copies/mL. A negative result does not preclude SARS-Cov-2 infection and should not be used as the  sole basis for treatment or other patient management decisions. A negative result may occur with  improper specimen collection/handling, submission of specimen other than nasopharyngeal swab, presence of viral mutation(s) within the areas targeted by this assay, and inadequate number of viral copies(<138 copies/mL). A negative result must be combined with clinical observations, patient history, and epidemiological information. The expected result is Negative.  Fact Sheet for Patients:  EntrepreneurPulse.com.au  Fact Sheet for Healthcare Providers:  IncredibleEmployment.be  This test is no t yet approved or cleared by the [place] FDA and  has been authorized for detection and/or diagnosis of SARS-CoV-2 by FDA under an Emergency Use Authorization (EUA). This EUA will remain  in effect (meaning this test can be used) for the duration of the COVID-19 declaration under Section 564(b)(1) of the Act, 21 U.S.C.section 360bbb-3(b)(1), unless the authorization is  terminated  or revoked sooner.       Influenza A by PCR NEGATIVE NEGATIVE Final   Influenza B by PCR NEGATIVE NEGATIVE Final    Comment: (NOTE) The Xpert Xpress SARS-CoV-2/FLU/RSV plus assay is intended as an aid in the diagnosis of influenza from Nasopharyngeal swab specimens and should not be used as a sole basis for treatment. Nasal washings and aspirates are unacceptable for Xpert Xpress SARS-CoV-2/FLU/RSV testing.  Fact Sheet for Patients: EntrepreneurPulse.com.au  Fact Sheet for Healthcare Providers: IncredibleEmployment.be  This test is not yet approved or cleared by the [place] FDA and has been authorized for detection and/or diagnosis of SARS-CoV-2 by FDA under an Emergency Use Authorization (EUA). This EUA will remain in effect (meaning this test can be used) for the duration of the COVID-19 declaration under Section 564(b)(1) of the Act, 21 U.S.C. section 360bbb-3(b)(1), unless the authorization is terminated or revoked.  Performed at [facility], [address], [place], [place] [address]      Scheduled Meds:  amLODipine  5 mg Oral Daily   aspirin EC  81 mg Oral Daily   atorvastatin  40 mg Oral Daily   brimonidine  1 drop Both Eyes BID   And   timolol  1 drop Both Eyes BID   clopidogrel  75 mg Oral Q breakfast   dorzolamide  1 drop Both Eyes BID   escitalopram  20 mg Oral Daily   fluorometholone  1 drop Right Eye QID   ketorolac  1 drop Right Eye Daily   latanoprost  1 drop Both Eyes QHS   lisinopril  10 mg Oral Daily   Netarsudil Dimesylate  1 drop Both Eyes QHS   predniSONE  10 mg Oral Q breakfast   Continuous Infusions:  Procedures/Studies: CT HEAD WO CONTRAST (5MM)  Result Date: 06/25/2021 CLINICAL DATA:  Altered mental status. EXAM: CT HEAD WITHOUT CONTRAST TECHNIQUE: Contiguous axial images were obtained from the base of the skull through the vertex without intravenous contrast. COMPARISON:  December 08, 2020  FINDINGS: Brain: There is mild cerebral atrophy with widening of the extra-axial spaces and ventricular dilatation. There are areas of decreased attenuation within the white matter tracts of the supratentorial brain, consistent with microvascular disease changes. Small areas of cortical encephalomalacia, with adjacent chronic white matter low attenuation are seen within the bilateral cerebellar regions and parasagittal region of the right occipital lobe. Vascular: No hyperdense vessel or unexpected calcification. Skull: Normal. Negative for fracture or focal lesion. Sinuses/Orbits: No acute finding. Other: None. IMPRESSION: 1. No acute intracranial abnormality. 2. Generalized cerebral atrophy. 3. 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 Electronically Signed  By: Virgina Norfolk M.D.   On: 06/25/2021 23:34   MR ANGIO HEAD WO CONTRAST  Result Date: 06/26/2021 CLINICAL DATA:  Neuro deficit, acute, stroke suspected EXAM: MRI HEAD WITHOUT CONTRAST MRA HEAD WITHOUT CONTRAST TECHNIQUE: Multiplanar, multi-echo pulse sequences of the brain and surrounding structures were acquired without intravenous contrast. Angiographic images of the Circle of Willis were acquired using MRA technique without intravenous contrast. COMPARISON:  No pertinent prior exam. FINDINGS: MRI HEAD Brain: Punctate focus of mildly reduced diffusion in the inferior left frontal lobe (series 11, image 16). There is no acute infarction or intracranial hemorrhage. There is no intracranial mass or mass effect. There is no hydrocephalus or extra-axial fluid collection. Chronic bilateral cerebellar infarcts. Chronic right occipital infarct. Patchy and confluent areas of T2 hyperintensity in the supratentorial and pontine white matter are nonspecific but may reflect moderate chronic microvascular ischemic changes. Vascular: Major vessel flow voids at the skull base are preserved. Skull and upper cervical spine: Normal marrow  signal is preserved. Sinuses/Orbits: Paranasal sinuses are aerated. Orbits are unremarkable. Other: Sella is unremarkable.  Mastoid air cells are clear. MRA HEAD Motion artifact present. Intracranial internal carotid arteries are patent with atherosclerotic irregularity. Middle and anterior cerebral arteries are patent. Intracranial vertebral arteries, basilar artery, posterior cerebral arteries are patent. There is no significant stenosis. IMPRESSION: Punctate acute infarct inferior left frontal lobe. Chronic right occipital and bilateral cerebellar infarcts. Moderate chronic microvascular ischemic changes. No proximal intracranial vessel occlusion or significant stenosis. Electronically Signed   By: Macy Mis M.D.   On: 06/26/2021 09:49   MR BRAIN WO CONTRAST  Result Date: 06/26/2021 CLINICAL DATA:  Neuro deficit, acute, stroke suspected EXAM: MRI HEAD WITHOUT CONTRAST MRA HEAD WITHOUT CONTRAST TECHNIQUE: Multiplanar, multi-echo pulse sequences of the brain and surrounding structures were acquired without intravenous contrast. Angiographic images of the Circle of Willis were acquired using MRA technique without intravenous contrast. COMPARISON:  No pertinent prior exam. FINDINGS: MRI HEAD Brain: Punctate focus of mildly reduced diffusion in the inferior left frontal lobe (series 11, image 16). There is no acute infarction or intracranial hemorrhage. There is no intracranial mass or mass effect. There is no hydrocephalus or extra-axial fluid collection. Chronic bilateral cerebellar infarcts. Chronic right occipital infarct. Patchy and confluent areas of T2 hyperintensity in the supratentorial and pontine white matter are nonspecific but may reflect moderate chronic microvascular ischemic changes. Vascular: Major vessel flow voids at the skull base are preserved. Skull and upper cervical spine: Normal marrow signal is preserved. Sinuses/Orbits: Paranasal sinuses are aerated. Orbits are unremarkable. Other:  Sella is unremarkable.  Mastoid air cells are clear. MRA HEAD Motion artifact present. Intracranial internal carotid arteries are patent with atherosclerotic irregularity. Middle and anterior cerebral arteries are patent. Intracranial vertebral arteries, basilar artery, posterior cerebral arteries are patent. There is no significant stenosis. IMPRESSION: Punctate acute infarct inferior left frontal lobe. Chronic right occipital and bilateral cerebellar infarcts. Moderate chronic microvascular ischemic changes. No proximal intracranial vessel occlusion or significant stenosis. Electronically Signed   By: Macy Mis M.D.   On: 06/26/2021 09:49   DG Chest Portable 1 View  Result Date: 06/25/2021 CLINICAL DATA:  Altered mental status. EXAM: PORTABLE CHEST 1 VIEW COMPARISON:  Chest radiograph dated 05/23/2021. FINDINGS: Focal consolidation, pleural effusion or pneumothorax mild cardiomegaly. No acute osseous pathology. With degenerative changes of the spine and shoulders. IMPRESSION: No active cardiopulmonary disease. Electronically Signed   By: Anner Crete M.D.   On: 06/25/2021 23:42   EEG adult  Result Date: 06/27/2021 Phillips Odor, MD     06/27/2021  6:26 PM [place] A. [name], MD     [email or web address]       HISTORY: The patient is 3 and presents with altered mental status. 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 MEDICATIONS: Current Facility-Administered Medications:   acetaminophen (TYLENOL) tablet 650 mg, 650 mg, Oral, Q4H PRN **OR** acetaminophen (TYLENOL) 160 MG/5ML solution 650 mg, 650 mg, Per Tube, Q4H PRN **OR** acetaminophen (TYLENOL) suppository 650 mg, 650 mg, Rectal, Q4H PRN, Zierle-Ghosh, Asia B, DO   amLODipine (NORVASC) tablet 5 mg, 5 mg, Oral, Daily, Amin, Ankit Chirag, MD, 5 mg at 06/27/21 1126   aspirin EC tablet 81 mg, 81 mg, Oral, Daily, Zierle-Ghosh, Asia B, DO, 81 mg at 06/27/21 1124   atorvastatin (LIPITOR) tablet 40  mg, 40 mg, Oral, Daily, Amin, Ankit Chirag, MD, 40 mg at 06/27/21 1125   brimonidine (ALPHAGAN) 0.2 % ophthalmic solution 1 drop, 1 drop, Both Eyes, BID, 1 drop at 06/27/21 1212 **AND** timolol (TIMOPTIC) 0.5 % ophthalmic solution 1 drop, 1 drop, Both Eyes, BID, Hall, Scott A, RPH, 1 drop at 06/27/21 1217   clopidogrel (PLAVIX) tablet 75 mg, 75 mg, Oral, Q breakfast, [place], Kofi, MD, 75 mg at 06/27/21 1125   dorzolamide (TRUSOPT) 2 % ophthalmic solution 1 drop, 1 drop, Both Eyes, BID, Amin, Ankit Chirag, MD, 1 drop at 06/27/21 1221   escitalopram (LEXAPRO) tablet 20 mg, 20 mg, Oral, Daily, Zierle-Ghosh, Asia B, DO, 20 mg at 06/27/21 1123   fluorometholone (FML) 0.1 % ophthalmic suspension 1 drop, 1 drop, Right Eye, QID, Amin, Ankit Chirag, MD, 1 drop at 06/27/21 1219   haloperidol (HALDOL) tablet 1 mg, 1 mg, Oral, Q6H PRN **OR** haloperidol lactate (HALDOL) injection 2 mg, 2 mg, Intramuscular, Q6H PRN, Amin, Ankit Chirag, MD   heparin injection 5,000 Units, 5,000 Units, Subcutaneous, Q8H, Zierle-Ghosh, Asia B, DO, 5,000 Units at 06/27/21 0518   hydrALAZINE (APRESOLINE) injection 10 mg, 10 mg, Intravenous, Q4H PRN, Amin, Ankit Chirag, MD, 10 mg at 06/26/21 1136   ipratropium-albuterol (DUONEB) 0.5-2.5 (3) MG/3ML nebulizer solution 3 mL, 3 mL, Nebulization, Q4H PRN, Amin, Ankit Chirag, MD   ketorolac (ACULAR) 0.5 % ophthalmic solution 1 drop, 1 drop, Right Eye, Daily, Amin, Ankit Chirag, MD   latanoprost (XALATAN) 0.005 % ophthalmic solution 1 drop, 1 drop, Both Eyes, QHS, Amin, Ankit Chirag, MD, 1 drop at 06/26/21 2303   lisinopril (ZESTRIL) tablet 10 mg, 10 mg, Oral, Daily, Amin, Ankit Chirag, MD, 10 mg at 06/27/21 1125   metoprolol tartrate (LOPRESSOR) injection 5 mg, 5 mg, Intravenous, Q4H PRN, Amin, Ankit Chirag, MD   Netarsudil Dimesylate 0.02 % SOLN 1 drop, 1 drop, Both Eyes, QHS, Amin, Ankit Chirag, MD   predniSONE (DELTASONE) tablet 10 mg, 10 mg, Oral, Q breakfast, Zierle-Ghosh, Asia B, DO, 10 mg at  06/27/21 1214   senna-docusate (Senokot-S) tablet 1 tablet, 1 tablet, Oral, QHS PRN, Zierle-Ghosh, Asia B, DO   traZODone (DESYREL) tablet 50 mg, 50 mg, Oral, QHS PRN, Amin, Ankit Chirag, MD ANALYSIS: A 16 channel recording using standard 10 20 measurements is conducted for 23 minutes.  The background activity gets as high as 6-7 hertz. There is beta activity observed in the frontal areas. Most of the recording occurs during sleep with K complexes, spindles and even slow-wave activity. There is no focal or lateral slowing. There is no epileptiform activities noted. IMPRESSION:  1.  This recording of the awake and  sleep states shows some mild to moderate global slowing. However, no epileptiform activities are noted. Kofi A. [name], M.D. Diplomate, Tax adviser of Psychiatry and Neurology ( Neurology).   DG FLUORO GUIDED NEEDLE PLC ASPIRATION/INJECTION LOC  Result Date: 06/28/2021 CLINICAL DATA:  Altered mental status EXAM: DIAGNOSTIC LUMBAR PUNCTURE UNDER FLUOROSCOPIC GUIDANCE COMPARISON:  CT head 06/25/2021 FLUOROSCOPY TIME:  Fluoroscopy Time:  0 minutes 12 seconds Radiation Exposure Index (if provided by the fluoroscopic device): 1.6 mGy Number of Acquired Spot Images: 1 PROCEDURE: Procedure, benefits, and risks were discussed with the patient, including alternatives. Patient's questions were answered. Written informed consent was obtained. Timeout protocol followed. Patient placed prone. L4-L5 disc space was localized under fluoroscopy. Skin prepped and draped in usual sterile fashion. Skin and soft tissues anesthetized with 4 mL of 1% lidocaine. 22 gauge needle was advanced into the spinal canal where clear colorless CSF was encountered with an opening pressure of 9 cm H2O (LLD). 10 mL of CSF was obtained in 4 tubes for requested analysis. Procedure tolerated very well by patient without immediate complication. IMPRESSION: Successful lumbar puncture as above Electronically Signed   By: Lavonia Dana M.D.    On: 06/28/2021 14:34   ECHOCARDIOGRAM COMPLETE  Result Date: 06/27/2021    ECHOCARDIOGRAM REPORT   Patient Name:   DELITA CHIQUITO Date of Exam: 06/27/2021 Medical Rec #:  [id number]         Height:       65.0 in Accession #:    [id number]        Weight:       130.1 lb Date of Birth:  16-Sep-1946          BSA:          1.648 m Patient Age:    53 years          BP:           168/101 mmHg Patient Gender: F                 HR:           77 bpm. Exam Location:  [facility] Procedure: 2D Echo, Cardiac Doppler and Color Doppler Indications:    TIA  History:        Patient has prior history of Echocardiogram examinations, most                 recent 12/12/2020. TIA; Risk Factors:Hypertension and                 Dyslipidemia. Pericardial effusion.  Sonographer:    Wenda Low Referring Phys: [id number] ASIA B [place]  1. Left ventricular ejection fraction, by estimation, is 60 to 65%. The left ventricle has normal function. The left ventricle has no regional wall motion abnormalities. There is mild left ventricular hypertrophy. Left ventricular diastolic parameters are consistent with Grade I diastolic dysfunction (impaired relaxation).  2. Right ventricular systolic function is normal. The right ventricular size is normal. Tricuspid regurgitation signal is inadequate for assessing PA pressure.  3. The mitral valve is normal in structure. Mild mitral valve regurgitation. No evidence of mitral stenosis.  4. The aortic valve is tricuspid. Aortic valve regurgitation is not visualized. No aortic stenosis is present. FINDINGS  Left Ventricle: Left ventricular ejection fraction, by estimation, is 60 to 65%. The left ventricle has normal function. The left ventricle has no regional wall motion abnormalities. The left ventricular internal cavity size was normal in size. There is  mild  left ventricular hypertrophy. Left ventricular diastolic parameters are consistent with Grade I diastolic dysfunction (impaired  relaxation). Normal left ventricular filling pressure. Right Ventricle: The right ventricular size is normal. No increase in right ventricular wall thickness. Right ventricular systolic function is normal. Tricuspid regurgitation signal is inadequate for assessing PA pressure. Left Atrium: Left atrial size was normal in size. Right Atrium: Right atrial size was normal in size. Pericardium: There is no evidence of pericardial effusion. Mitral Valve: The mitral valve is normal in structure. Mild mitral valve regurgitation. No evidence of mitral valve stenosis. MV peak gradient, 3.0 mmHg. The mean mitral valve gradient is 1.0 mmHg. Tricuspid Valve: The tricuspid valve is normal in structure. Tricuspid valve regurgitation is trivial. No evidence of tricuspid stenosis. Aortic Valve: The aortic valve is tricuspid. Aortic valve regurgitation is not visualized. No aortic stenosis is present. Aortic valve mean gradient measures 4.0 mmHg. Aortic valve peak gradient measures 7.4 mmHg. Aortic valve area, by VTI measures 1.63 cm. Pulmonic Valve: The pulmonic valve was not well visualized. Pulmonic valve regurgitation is not visualized. No evidence of pulmonic stenosis. Aorta: The aortic root is normal in size and structure. Venous: The inferior vena cava was not well visualized. IAS/Shunts: No atrial level shunt detected by color flow Doppler.  LEFT VENTRICLE PLAX 2D LVIDd:         4.20 cm     Diastology LVIDs:         2.90 cm     LV e' medial:   6.53 cm/s LV PW:         1.00 cm     LV E/e' medial: 7.1 LV IVS:        1.20 cm LVOT diam:     1.90 cm LV SV:         43 LV SV Index:   26 LVOT Area:     2.84 cm  LV Volumes (MOD) LV vol d, MOD A2C: 59.7 ml LV vol d, MOD A4C: 69.4 ml LV vol s, MOD A2C: 32.4 ml LV vol s, MOD A4C: 31.4 ml LV SV MOD A2C:     27.3 ml LV SV MOD A4C:     69.4 ml LV SV MOD BP:      35.6 ml RIGHT VENTRICLE RV Basal diam:  2.90 cm RV Mid diam:    2.90 cm RV S prime:     10.00 cm/s TAPSE (M-mode): 1.8 cm LEFT  ATRIUM             Index       RIGHT ATRIUM           Index LA diam:        2.70 cm 1.64 cm/m  RA Area:     13.30 cm LA Vol (A2C):   36.7 ml 22.28 ml/m RA Volume:   28.70 ml  17.42 ml/m LA Vol (A4C):   32.2 ml 19.54 ml/m LA Biplane Vol: 34.7 ml 21.06 ml/m  AORTIC VALVE                   PULMONIC VALVE AV Area (Vmax):    1.68 cm    PV Vmax:       1.11 m/s AV Area (Vmean):   1.61 cm    PV Peak grad:  4.9 mmHg AV Area (VTI):     1.63 cm AV Vmax:           136.00 cm/s AV Vmean:  96.800 cm/s AV VTI:            0.263 m AV Peak Grad:      7.4 mmHg AV Mean Grad:      4.0 mmHg LVOT Vmax:         80.70 cm/s LVOT Vmean:        54.900 cm/s LVOT VTI:          0.151 m LVOT/AV VTI ratio: 0.57  AORTA Ao Root diam: 2.70 cm Ao Asc diam:  3.20 cm MITRAL VALVE MV Area (PHT): 3.61 cm    SHUNTS MV Area VTI:   2.48 cm    Systemic VTI:  0.15 m MV Peak grad:  3.0 mmHg    Systemic Diam: 1.90 cm MV Mean grad:  1.0 mmHg MV Vmax:       0.86 m/s MV Vmean:      47.8 cm/s MV Decel Time: 210 msec MV E velocity: 46.40 cm/s MV A velocity: 77.90 cm/s MV E/A ratio:  0.60 [place] Dolly MD Electronically signed by Carlyle Dolly MD Signature Date/Time: 06/27/2021/12:51:49 PM    Final     Orson Eva, DO  [facility]  If 7PM-7AM, please contact night-coverage [email or web address] Password [facility] 06/30/2021, 12:59 PM   LOS: 4 days

## 2021-06-30 NOTE — Plan of Care (Signed)

## 2021-07-01 ENCOUNTER — Inpatient Hospital Stay (HOSPITAL_COMMUNITY): Payer: Medicare Other

## 2021-07-01 DIAGNOSIS — R4189 Other symptoms and signs involving cognitive functions and awareness: Secondary | ICD-10-CM

## 2021-07-01 LAB — HSV(HERPES SMPLX VRS)ABS-I+II(IGG)-CSF: HSV Type I/II Ab, IgG CSF: 2.32 IV — ABNORMAL HIGH (ref <=0.89)

## 2021-07-01 LAB — BASIC METABOLIC PANEL
Anion gap: 4 — ABNORMAL LOW (ref 5–15)
BUN: 28 mg/dL — ABNORMAL HIGH (ref 8–23)
CO2: 24 mmol/L (ref 22–32)
Calcium: 8.7 mg/dL — ABNORMAL LOW (ref 8.9–10.3)
Chloride: 108 mmol/L (ref 98–111)
Creatinine, Ser: 0.44 mg/dL (ref 0.44–1.00)
GFR, Estimated: >60 mL/min (ref >60)
Glucose, Bld: 113 mg/dL — ABNORMAL HIGH (ref 70–99)
Potassium: 4.1 mmol/L (ref 3.5–5.1)
Sodium: 136 mmol/L (ref 135–145)

## 2021-07-01 LAB — CBC
HCT: 36 % (ref 36.0–46.0)
Hemoglobin: 11.8 g/dL — ABNORMAL LOW (ref 12.0–15.0)
MCH: 30.9 pg (ref 26.0–34.0)
MCHC: 32.8 g/dL (ref 30.0–36.0)
MCV: 94.2 fL (ref 80.0–100.0)
Platelets: 289 10*3/uL (ref 150–400)
RBC: 3.82 MIL/uL — ABNORMAL LOW (ref 3.87–5.11)
RDW: 13.2 % (ref 11.5–15.5)
WBC: 12.8 10*3/uL — ABNORMAL HIGH (ref 4.0–10.5)
nRBC: 0 % (ref 0.0–0.2)

## 2021-07-01 LAB — MAGNESIUM: Magnesium: 2 mg/dL (ref 1.7–2.4)

## 2021-07-01 MED ORDER — AMLODIPINE BESYLATE 5 MG PO TABS
5.0000 mg | ORAL_TABLET | Freq: Every day | ORAL | 1 refills | Status: DC
Start: 2021-07-02 — End: 2021-07-10

## 2021-07-01 MED ORDER — ASPIRIN 81 MG PO TBEC: 81.0000 mg | DELAYED_RELEASE_TABLET | Freq: Every day | ORAL | 0 refills | Status: DC

## 2021-07-01 MED ORDER — CLOPIDOGREL BISULFATE 75 MG PO TABS: 75.0000 mg | ORAL_TABLET | Freq: Every day | ORAL | 2 refills | Status: DC

## 2021-07-01 MED ORDER — ATORVASTATIN CALCIUM 40 MG PO TABS: 40.0000 mg | ORAL_TABLET | Freq: Every day | ORAL | 1 refills | Status: DC

## 2021-07-01 NOTE — Progress Notes (Signed)
PT Cancellation Note  Patient Details Name: RUDINE RIEGER MRN: [id number] DOB: [birth year]   Cancelled Treatment:     Received an order for evaluation and treat.  Pt has already been being seen by PT please see most recent note.   Rayetta Humphrey, [place] CLT [phone]  07/01/2021, 10:01 AM

## 2021-07-01 NOTE — TOC Transition Note (Signed)
Transition of Care [facility]) - CM/SW Discharge Note   Patient Details  Name: Mckenzie [name] MRN: [id number] Date of Birth: March 20, 1946  [facility] CM/SW Contact:  Natasha Bence, LCSW Phone Number: 07/01/2021, 1:08 PM   Clinical Narrative:    CSW notified of patient's readiness for discharge. CSW contacted [place] with [employer] and notified her of patient's discharge.Hoyle Sauer agreeable to resume [place] services. TOC signing off.   Final next level of care: [place] Barriers to Discharge: Barriers Resolved   Patient Goals and CMS Choice Patient states their goals for this hospitalization and ongoing recovery are:: Return home with [facility] CMS Medicare.gov Compare Post Acute Care list provided to:: Patient Choice offered to / list presented to : Patient  Discharge Placement                       Discharge Plan and Services                          HH Arranged: PT [facility] Agency: Other - See comment Date HH Agency Contacted: 07/01/21 Time HH Agency Contacted: [phone]    Social Determinants of Health (SDOH) Interventions     Readmission Risk Interventions No flowsheet data found.

## 2021-07-01 NOTE — Progress Notes (Deleted)
Office Visit Note  Patient: Mckenzie [name]             Date of Birth: 05-Nov-1945           MRN: [id number]             PCP: Loman Brooklyn, FNP Referring: Loman Brooklyn, FNP Visit Date: 07/02/2021   Subjective:  No chief complaint on file.   History of Present Illness: Mckenzie [name] is a 75 y.o. female here for follow up for GCA on Actemra and prednisone. Since our last visit a lot happened she was recommended to the ED for severely worsening low back and sacral pain found to have bilateral insufficiency fractures going for nonoperative management plan. She went for surgery to address the glaucoma suffering exacerbation due to prolonged steroid use. She was then found to have ongoing fluctuations in mental state and daughter noticing large variations in blood pressure values. On 9/19 she abruptly developed confusion slurred speech and right arm weakness for which she was admitted for new stroke without specific large vessel occlusion. Workup included LP without specific positive findings but was treated on high dose steroids with neurology recommendation transitioning to 60 mg daily then tapering again. ***   Previous HPI 05/23/21 Mckenzie EVELLYN [name] is a 75 y.o. female here for follow up for GCA currently on Actemra and prednisone 10 mg PO daily. Sicne our last visit she experienced increase pain and stiffness in the back and hips and sustained a fall about 2 weeks ago.  She was overall doing pretty well felt her treatment control symptoms adequately until after this fall.  She struck her head with superficial injury ENT exam was not concerned for complications and no imaging or further work-up obtained.  A week after the fall she is now experiencing increasing pain affecting her chest and in her very low back and sacrum area.  The chest pain is not particularly affected by deep inspiration only sometimes also with increased pain reaching overhead.  Her low back pain is worsened with  standing from a seated position.  She denies any radiation to the legs weakness or decreased sensation in her legs.  04/25/21 Mckenzie [name] is a 75 y.o. female here for follow up for GCA on Actemra IV q4wks and prednisone taper with current target of 20mg  daly dose. At our last visit she was experiencing increased bruising and skin tears from the high dose prednisone.  Since her last visit she feels partial improvement in her skin symptoms with decreased areas of skin tearing and bruising though she still has some unresolved wound on her bilateral shins.  She feels she has had some minor increase in cognitive or concentration difficulties.  Her ophthalmology follow-up was concern for ongoing finding she has been referred apparently to [facility] ophthalmology but not yet scheduled any appointment for this.   03/26/21 Mckenzie [name] is a 75 y.o. female here for follow up for giant cell arteritis currently on prednisone 50 mg p.o. daily and recently started Actemra infusion treatment 2 weeks ago.  After her initial visit prednisone taper was unsuccessful due to recurrence of fatigue proximal muscle strength weakness and elevated inflammatory markers so prednisone was increased back to high-dose and maintained there.  She feels her symptoms are well controlled though is having a lot of problems with side effects from the steroids.  She is having extensive bruising skin fragility and tearing and ophthalmology visit with Dr. Midge Aver earlier today  demonstrated increased intraocular pressure to 26 of the right eye left side normal.   01/02/21 Mckenzie [name] is a 75 y.o. female here for giant cell arteritis. Symptoms seem to extend back to last year with developing severe stiffness and aches in her upper and lower extremities with progressive decrease in mobility and function. She was hospitalized in December after fall with distal radius fracture. After that she continued overall decline and was admitted to  the hospital again in March with hyponatremia affecting weakness and mental status. Workup revealed transudative pericardial effusion also descending thoracic aorta wall thickening and high inflammatory markers concerning for GCA. She underewnt bilateral temporal artery biopsies with pathology consistent with GCA. She started high dose steroids with slow tapering on prednisone and discharged home. Since that time she and her husband have noticed a very large improvement in her status mentally and physically. She is continuing the recommended therapy exercises at home although not in formal PT or home PT yet. Currently at 40mg  per day dose. She denies significant headache, jaw pain besides chronic TMJ, or visual changes. She reports ophthalmology follow up for her glaucoma noted actually decreased pressures on the prednisone. Cardiology follow up indicates partial reduction of effusion.   12/2020 Vit D 36.27 WBC 12.5 Plt 617   Biopsy from 12/15/20 was consistent with GCA on bilateral specimens.   No Rheumatology ROS completed.   PMFS History:  Patient Active Problem List   Diagnosis Date Noted   TIA (transient ischemic attack) 06/26/2021   Hypokalemia 06/26/2021   Temporal arteritis ([place]) 06/26/2021   Acute CVA (cerebrovascular accident) ([place]) 06/26/2021   Compression fracture of T1 vertebra ([place]) 05/25/2021   Compression fracture of T4 vertebra ([place]) 05/25/2021   Fracture of sacrum ([place]) 05/25/2021   Fracture of manubrium 05/25/2021   Low back pain 05/23/2021   Depression 01/23/2021   High risk medication use 01/02/2021   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[place]) 12/27/2020   Pericardial effusion 12/27/2020   Abnormal MRI, cervical spine 12/27/2020   Generalized weakness 12/27/2020   Steroid-induced hyperglycemia 12/27/2020   Malnutrition of moderate degree 12/11/2020   Pressure injury of skin 12/09/2020   Benign neoplasm of cecum    Benign neoplasm of descending colon    Hiatal hernia     Hyponatremia 12/08/2020   Thrombocytosis 12/08/2020   Hypoalbuminemia 12/08/2020   Memory loss 12/08/2020   Constipation 12/06/2020   Anemia of chronic disease 12/06/2020   Gait disturbance 11/14/2020   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 11/14/2020   Cognitive change 11/14/2020   Adjustment disorder with mixed anxiety and depressed mood 10/10/2020   Fracture 10/06/2020   Distal radius fracture 03/49/1791   Acute metabolic encephalopathy 50/56/9794   Proximal weakness of extremity 09/26/2020   Myalgia 09/26/2020   Fall at home, subsequent encounter 09/26/2020   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   Essential hypertension 01/11/2014   Vertigo 01/11/2014   Hyperlipidemia 01/11/2014    Past Medical History:  Diagnosis Date   Depression    High cholesterol    Hypertension    Retinal micro-aneurysm of right eye     Family History  Problem Relation Age of Onset   Hyperlipidemia Mother    Hypertension Mother    Diabetes Mother    COPD Father    Depression Sister    Suicidality Brother    Drug abuse Brother    Healthy Daughter    Healthy Daughter    Healthy Daughter    Colon cancer Neg Hx    Celiac  disease Neg Hx    Inflammatory bowel disease Neg Hx    Past Surgical History:  Procedure Laterality Date   ARTERY BIOPSY Bilateral 12/15/2020   Procedure: BILATERAL TEMPORAL ARTERY BIOPSIES;  Surgeon: Angelia Mould, MD;  Location: [facility] OR;  Service: Vascular;  Laterality: Bilateral;   COLONOSCOPY WITH PROPOFOL N/A 12/09/2020   Procedure: COLONOSCOPY WITH PROPOFOL;  Surgeon: Gatha Mayer, MD;  Location: [place];  Service: Endoscopy;  Laterality: N/A;   ESOPHAGOGASTRODUODENOSCOPY (EGD) WITH PROPOFOL N/A 12/09/2020   Procedure: ESOPHAGOGASTRODUODENOSCOPY (EGD) WITH PROPOFOL;  Surgeon: Gatha Mayer, MD;  Location: [place];  Service: Endoscopy;  Laterality: N/A;   EYE SURGERY     FINGER SURGERY Left    RING FINGER   IR THORACENTESIS ASP PLEURAL SPACE W/IMG GUIDE  12/13/2020    OPEN REDUCTION INTERNAL FIXATION (ORIF) DISTAL RADIAL FRACTURE Left 10/05/2020   Procedure: OPEN REDUCTION INTERNAL FIXATION (ORIF) DISTAL RADIAL FRACTURE;  Surgeon: Shona Needles, MD;  Location: [place];  Service: Orthopedics;  Laterality: Left;  supraclavicular block   POLYPECTOMY  12/09/2020   Procedure: POLYPECTOMY;  Surgeon: Gatha Mayer, MD;  Location: [facility] ENDOSCOPY;  Service: Endoscopy;;   TUBAL LIGATION     Social History   Social History Narrative   Right handed   Caffeine use: coffee daily   Immunization History  Administered Date(s) Administered   PFIZER Comirnaty(Gray Top)Covid-19 Tri-Sucrose Vaccine 11/11/2019, 12/06/2019, 10/31/2020   PFIZER(Purple Top)SARS-COV-2 Vaccination 11/11/2019, 12/06/2019, 10/31/2020   Pneumococcal Conjugate-13 08/21/2018   Pneumococcal Polysaccharide-23 07/07/2012   Td 05/31/2011   Tdap 10/07/2010, 05/31/2011     Objective: Vital Signs: There were no vitals taken for this visit.   Physical Exam   Musculoskeletal Exam: ***  CDAI Exam: CDAI Score: -- Patient Global: --; Provider Global: -- Swollen: --; Tender: -- Joint Exam 07/02/2021   No joint exam has been documented for this visit   There is currently no information documented on the homunculus. Go to the Rheumatology activity and complete the homunculus joint exam.  Investigation: No additional findings.  Imaging: CT HEAD WO CONTRAST (5MM)  Result Date: 07/01/2021 CLINICAL DATA:  Stroke suggested scratch set suspect stroke. Neurologic deficit. EXAM: CT HEAD WITHOUT CONTRAST TECHNIQUE: Contiguous axial images were obtained from the base of the skull through the vertex without intravenous contrast. COMPARISON:  Brain MRI 06/26/2021 FINDINGS: Brain: No evidence of acute infarction, hemorrhage, hydrocephalus, extra-axial collection or mass lesion/mass effect. Remote bilateral cerebellar hemisphere infarcts identified, left greater than right. There is mild diffuse low-attenuation  within the subcortical and periventricular white matter compatible with chronic microvascular disease. Prominence of the sulci and ventricles compatible with brain atrophy. Vascular: No hyperdense vessel or unexpected calcification. Skull: Normal. Negative for fracture or focal lesion. Sinuses/Orbits: No acute finding. Other: None IMPRESSION: 1. No acute intracranial abnormalities. 2. 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 3. Remote bilateral cerebellar hemisphere infarcts. Electronically Signed   By: Kerby Moors M.D.   On: 07/01/2021 10:55   CT HEAD WO CONTRAST (5MM)  Result Date: 06/25/2021 CLINICAL DATA:  Altered mental status. EXAM: CT HEAD WITHOUT CONTRAST TECHNIQUE: Contiguous axial images were obtained from the base of the skull through the vertex without intravenous contrast. COMPARISON:  December 08, 2020 FINDINGS: Brain: There is mild cerebral atrophy with widening of the extra-axial spaces and ventricular dilatation. There are areas of decreased attenuation within the white matter tracts of the supratentorial brain, consistent with microvascular disease changes. Small areas of cortical encephalomalacia, with adjacent  chronic white matter low attenuation are seen within the bilateral cerebellar regions and parasagittal region of the right occipital lobe. Vascular: No hyperdense vessel or unexpected calcification. Skull: Normal. Negative for fracture or focal lesion. Sinuses/Orbits: No acute finding. Other: None. IMPRESSION: 1. No acute intracranial abnormality. 2. Generalized cerebral atrophy. 3. 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 Electronically Signed   By: Virgina Norfolk M.D.   On: 06/25/2021 23:34   MR ANGIO HEAD WO CONTRAST  Result Date: 06/26/2021 CLINICAL DATA:  Neuro deficit, acute, stroke suspected EXAM: MRI HEAD WITHOUT CONTRAST MRA HEAD WITHOUT CONTRAST TECHNIQUE: Multiplanar, multi-echo pulse sequences of the brain and surrounding  structures were acquired without intravenous contrast. Angiographic images of the Circle of Willis were acquired using MRA technique without intravenous contrast. COMPARISON:  No pertinent prior exam. FINDINGS: MRI HEAD Brain: Punctate focus of mildly reduced diffusion in the inferior left frontal lobe (series 11, image 16). There is no acute infarction or intracranial hemorrhage. There is no intracranial mass or mass effect. There is no hydrocephalus or extra-axial fluid collection. Chronic bilateral cerebellar infarcts. Chronic right occipital infarct. Patchy and confluent areas of T2 hyperintensity in the supratentorial and pontine white matter are nonspecific but may reflect moderate chronic microvascular ischemic changes. Vascular: Major vessel flow voids at the skull base are preserved. Skull and upper cervical spine: Normal marrow signal is preserved. Sinuses/Orbits: Paranasal sinuses are aerated. Orbits are unremarkable. Other: Sella is unremarkable.  Mastoid air cells are clear. MRA HEAD Motion artifact present. Intracranial internal carotid arteries are patent with atherosclerotic irregularity. Middle and anterior cerebral arteries are patent. Intracranial vertebral arteries, basilar artery, posterior cerebral arteries are patent. There is no significant stenosis. IMPRESSION: Punctate acute infarct inferior left frontal lobe. Chronic right occipital and bilateral cerebellar infarcts. Moderate chronic microvascular ischemic changes. No proximal intracranial vessel occlusion or significant stenosis. Electronically Signed   By: Macy Mis M.D.   On: 06/26/2021 09:49   MR BRAIN WO CONTRAST  Result Date: 06/26/2021 CLINICAL DATA:  Neuro deficit, acute, stroke suspected EXAM: MRI HEAD WITHOUT CONTRAST MRA HEAD WITHOUT CONTRAST TECHNIQUE: Multiplanar, multi-echo pulse sequences of the brain and surrounding structures were acquired without intravenous contrast. Angiographic images of the Circle of Willis  were acquired using MRA technique without intravenous contrast. COMPARISON:  No pertinent prior exam. FINDINGS: MRI HEAD Brain: Punctate focus of mildly reduced diffusion in the inferior left frontal lobe (series 11, image 16). There is no acute infarction or intracranial hemorrhage. There is no intracranial mass or mass effect. There is no hydrocephalus or extra-axial fluid collection. Chronic bilateral cerebellar infarcts. Chronic right occipital infarct. Patchy and confluent areas of T2 hyperintensity in the supratentorial and pontine white matter are nonspecific but may reflect moderate chronic microvascular ischemic changes. Vascular: Major vessel flow voids at the skull base are preserved. Skull and upper cervical spine: Normal marrow signal is preserved. Sinuses/Orbits: Paranasal sinuses are aerated. Orbits are unremarkable. Other: Sella is unremarkable.  Mastoid air cells are clear. MRA HEAD Motion artifact present. Intracranial internal carotid arteries are patent with atherosclerotic irregularity. Middle and anterior cerebral arteries are patent. Intracranial vertebral arteries, basilar artery, posterior cerebral arteries are patent. There is no significant stenosis. IMPRESSION: Punctate acute infarct inferior left frontal lobe. Chronic right occipital and bilateral cerebellar infarcts. Moderate chronic microvascular ischemic changes. No proximal intracranial vessel occlusion or significant stenosis. Electronically Signed   By: Macy Mis M.D.   On: 06/26/2021 09:49   DG Chest Portable 1 View  Result  Date: 06/25/2021 CLINICAL DATA:  Altered mental status. EXAM: PORTABLE CHEST 1 VIEW COMPARISON:  Chest radiograph dated 05/23/2021. FINDINGS: Focal consolidation, pleural effusion or pneumothorax mild cardiomegaly. No acute osseous pathology. With degenerative changes of the spine and shoulders. IMPRESSION: No active cardiopulmonary disease. Electronically Signed   By: Anner Crete M.D.   On:  06/25/2021 23:42   EEG adult  Result Date: 06/27/2021 Phillips Odor, MD     06/27/2021  6:26 PM Clayton A. [name], MD     [email or web address]       HISTORY: The patient is 63 and presents with altered mental status. 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 MEDICATIONS: Current Facility-Administered Medications:   acetaminophen (TYLENOL) tablet 650 mg, 650 mg, Oral, Q4H PRN **OR** acetaminophen (TYLENOL) 160 MG/5ML solution 650 mg, 650 mg, Per Tube, Q4H PRN **OR** acetaminophen (TYLENOL) suppository 650 mg, 650 mg, Rectal, Q4H PRN, Zierle-Ghosh, Asia B, DO   amLODipine (NORVASC) tablet 5 mg, 5 mg, Oral, Daily, Amin, Ankit Chirag, MD, 5 mg at 06/27/21 1126   aspirin EC tablet 81 mg, 81 mg, Oral, Daily, Zierle-Ghosh, Asia B, DO, 81 mg at 06/27/21 1124   atorvastatin (LIPITOR) tablet 40 mg, 40 mg, Oral, Daily, Amin, Ankit Chirag, MD, 40 mg at 06/27/21 1125   brimonidine (ALPHAGAN) 0.2 % ophthalmic solution 1 drop, 1 drop, Both Eyes, BID, 1 drop at 06/27/21 1212 **AND** timolol (TIMOPTIC) 0.5 % ophthalmic solution 1 drop, 1 drop, Both Eyes, BID, Hall, Scott A, RPH, 1 drop at 06/27/21 1217   clopidogrel (PLAVIX) tablet 75 mg, 75 mg, Oral, Q breakfast, [place], Kofi, MD, 75 mg at 06/27/21 1125   dorzolamide (TRUSOPT) 2 % ophthalmic solution 1 drop, 1 drop, Both Eyes, BID, Amin, Ankit Chirag, MD, 1 drop at 06/27/21 1221   escitalopram (LEXAPRO) tablet 20 mg, 20 mg, Oral, Daily, Zierle-Ghosh, Asia B, DO, 20 mg at 06/27/21 1123   fluorometholone (FML) 0.1 % ophthalmic suspension 1 drop, 1 drop, Right Eye, QID, Amin, Ankit Chirag, MD, 1 drop at 06/27/21 1219   haloperidol (HALDOL) tablet 1 mg, 1 mg, Oral, Q6H PRN **OR** haloperidol lactate (HALDOL) injection 2 mg, 2 mg, Intramuscular, Q6H PRN, Amin, Ankit Chirag, MD   heparin injection 5,000 Units, 5,000 Units, Subcutaneous, Q8H, Zierle-Ghosh, Asia B, DO, 5,000 Units at 06/27/21 0518   hydrALAZINE (APRESOLINE)  injection 10 mg, 10 mg, Intravenous, Q4H PRN, Amin, Ankit Chirag, MD, 10 mg at 06/26/21 1136   ipratropium-albuterol (DUONEB) 0.5-2.5 (3) MG/3ML nebulizer solution 3 mL, 3 mL, Nebulization, Q4H PRN, Amin, Ankit Chirag, MD   ketorolac (ACULAR) 0.5 % ophthalmic solution 1 drop, 1 drop, Right Eye, Daily, Amin, Ankit Chirag, MD   latanoprost (XALATAN) 0.005 % ophthalmic solution 1 drop, 1 drop, Both Eyes, QHS, Amin, Ankit Chirag, MD, 1 drop at 06/26/21 2303   lisinopril (ZESTRIL) tablet 10 mg, 10 mg, Oral, Daily, Amin, Ankit Chirag, MD, 10 mg at 06/27/21 1125   metoprolol tartrate (LOPRESSOR) injection 5 mg, 5 mg, Intravenous, Q4H PRN, Amin, Ankit Chirag, MD   Netarsudil Dimesylate 0.02 % SOLN 1 drop, 1 drop, Both Eyes, QHS, Amin, Ankit Chirag, MD   predniSONE (DELTASONE) tablet 10 mg, 10 mg, Oral, Q breakfast, Zierle-Ghosh, Asia B, DO, 10 mg at 06/27/21 1214   senna-docusate (Senokot-S) tablet 1 tablet, 1 tablet, Oral, QHS PRN, Zierle-Ghosh, Asia B, DO   traZODone (DESYREL) tablet 50 mg, 50 mg, Oral, QHS PRN, Amin, Ankit Chirag, MD ANALYSIS: A 16 channel recording using standard 10  20 measurements is conducted for 23 minutes.  The background activity gets as high as 6-7 hertz. There is beta activity observed in the frontal areas. Most of the recording occurs during sleep with K complexes, spindles and even slow-wave activity. There is no focal or lateral slowing. There is no epileptiform activities noted. IMPRESSION:  1.  This recording of the awake and sleep states shows some mild to moderate global slowing. However, no epileptiform activities are noted. Kofi A. [name], M.D. Diplomate, Tax adviser of Psychiatry and Neurology ( Neurology).   DG FLUORO GUIDED NEEDLE PLC ASPIRATION/INJECTION LOC  Result Date: 06/28/2021 CLINICAL DATA:  Altered mental status EXAM: DIAGNOSTIC LUMBAR PUNCTURE UNDER FLUOROSCOPIC GUIDANCE COMPARISON:  CT head 06/25/2021 FLUOROSCOPY TIME:  Fluoroscopy Time:  0 minutes 12 seconds  Radiation Exposure Index (if provided by the fluoroscopic device): 1.6 mGy Number of Acquired Spot Images: 1 PROCEDURE: Procedure, benefits, and risks were discussed with the patient, including alternatives. Patient's questions were answered. Written informed consent was obtained. Timeout protocol followed. Patient placed prone. L4-L5 disc space was localized under fluoroscopy. Skin prepped and draped in usual sterile fashion. Skin and soft tissues anesthetized with 4 mL of 1% lidocaine. 22 gauge needle was advanced into the spinal canal where clear colorless CSF was encountered with an opening pressure of 9 cm H2O (LLD). 10 mL of CSF was obtained in 4 tubes for requested analysis. Procedure tolerated very well by patient without immediate complication. IMPRESSION: Successful lumbar puncture as above Electronically Signed   By: Lavonia Dana M.D.   On: 06/28/2021 14:34   ECHOCARDIOGRAM COMPLETE  Result Date: 06/27/2021    ECHOCARDIOGRAM REPORT   Patient Name:   LILLITH MCNEFF Date of Exam: 06/27/2021 Medical Rec #:  [id number]         Height:       65.0 in Accession #:    [id number]        Weight:       130.1 lb Date of Birth:  19-Apr-1946          BSA:          1.648 m Patient Age:    18 years          BP:           168/101 mmHg Patient Gender: F                 HR:           77 bpm. Exam Location:  [facility] Procedure: 2D Echo, Cardiac Doppler and Color Doppler Indications:    TIA  History:        Patient has prior history of Echocardiogram examinations, most                 recent 12/12/2020. TIA; Risk Factors:Hypertension and                 Dyslipidemia. Pericardial effusion.  Sonographer:    Wenda Low Referring Phys: [id number] ASIA B [place]  1. Left ventricular ejection fraction, by estimation, is 60 to 65%. The left ventricle has normal function. The left ventricle has no regional wall motion abnormalities. There is mild left ventricular hypertrophy. Left ventricular diastolic  parameters are consistent with Grade I diastolic dysfunction (impaired relaxation).  2. Right ventricular systolic function is normal. The right ventricular size is normal. Tricuspid regurgitation signal is inadequate for assessing PA pressure.  3. The mitral valve is normal in structure. Mild mitral valve regurgitation. No  evidence of mitral stenosis.  4. The aortic valve is tricuspid. Aortic valve regurgitation is not visualized. No aortic stenosis is present. FINDINGS  Left Ventricle: Left ventricular ejection fraction, by estimation, is 60 to 65%. The left ventricle has normal function. The left ventricle has no regional wall motion abnormalities. The left ventricular internal cavity size was normal in size. There is  mild left ventricular hypertrophy. Left ventricular diastolic parameters are consistent with Grade I diastolic dysfunction (impaired relaxation). Normal left ventricular filling pressure. Right Ventricle: The right ventricular size is normal. No increase in right ventricular wall thickness. Right ventricular systolic function is normal. Tricuspid regurgitation signal is inadequate for assessing PA pressure. Left Atrium: Left atrial size was normal in size. Right Atrium: Right atrial size was normal in size. Pericardium: There is no evidence of pericardial effusion. Mitral Valve: The mitral valve is normal in structure. Mild mitral valve regurgitation. No evidence of mitral valve stenosis. MV peak gradient, 3.0 mmHg. The mean mitral valve gradient is 1.0 mmHg. Tricuspid Valve: The tricuspid valve is normal in structure. Tricuspid valve regurgitation is trivial. No evidence of tricuspid stenosis. Aortic Valve: The aortic valve is tricuspid. Aortic valve regurgitation is not visualized. No aortic stenosis is present. Aortic valve mean gradient measures 4.0 mmHg. Aortic valve peak gradient measures 7.4 mmHg. Aortic valve area, by VTI measures 1.63 cm. Pulmonic Valve: The pulmonic valve was not well  visualized. Pulmonic valve regurgitation is not visualized. No evidence of pulmonic stenosis. Aorta: The aortic root is normal in size and structure. Venous: The inferior vena cava was not well visualized. IAS/Shunts: No atrial level shunt detected by color flow Doppler.  LEFT VENTRICLE PLAX 2D LVIDd:         4.20 cm     Diastology LVIDs:         2.90 cm     LV e' medial:   6.53 cm/s LV PW:         1.00 cm     LV E/e' medial: 7.1 LV IVS:        1.20 cm LVOT diam:     1.90 cm LV SV:         43 LV SV Index:   26 LVOT Area:     2.84 cm  LV Volumes (MOD) LV vol d, MOD A2C: 59.7 ml LV vol d, MOD A4C: 69.4 ml LV vol s, MOD A2C: 32.4 ml LV vol s, MOD A4C: 31.4 ml LV SV MOD A2C:     27.3 ml LV SV MOD A4C:     69.4 ml LV SV MOD BP:      35.6 ml RIGHT VENTRICLE RV Basal diam:  2.90 cm RV Mid diam:    2.90 cm RV S prime:     10.00 cm/s TAPSE (M-mode): 1.8 cm LEFT ATRIUM             Index       RIGHT ATRIUM           Index LA diam:        2.70 cm 1.64 cm/m  RA Area:     13.30 cm LA Vol (A2C):   36.7 ml 22.28 ml/m RA Volume:   28.70 ml  17.42 ml/m LA Vol (A4C):   32.2 ml 19.54 ml/m LA Biplane Vol: 34.7 ml 21.06 ml/m  AORTIC VALVE                   PULMONIC VALVE AV Area (Vmax):  1.68 cm    PV Vmax:       1.11 m/s AV Area (Vmean):   1.61 cm    PV Peak grad:  4.9 mmHg AV Area (VTI):     1.63 cm AV Vmax:           136.00 cm/s AV Vmean:          96.800 cm/s AV VTI:            0.263 m AV Peak Grad:      7.4 mmHg AV Mean Grad:      4.0 mmHg LVOT Vmax:         80.70 cm/s LVOT Vmean:        54.900 cm/s LVOT VTI:          0.151 m LVOT/AV VTI ratio: 0.57  AORTA Ao Root diam: 2.70 cm Ao Asc diam:  3.20 cm MITRAL VALVE MV Area (PHT): 3.61 cm    SHUNTS MV Area VTI:   2.48 cm    Systemic VTI:  0.15 m MV Peak grad:  3.0 mmHg    Systemic Diam: 1.90 cm MV Mean grad:  1.0 mmHg MV Vmax:       0.86 m/s MV Vmean:      47.8 cm/s MV Decel Time: 210 msec MV E velocity: 46.40 cm/s MV A velocity: 77.90 cm/s MV E/A ratio:  0.60 Carlyle Dolly MD Electronically signed by Carlyle Dolly MD Signature Date/Time: 06/27/2021/12:51:49 PM    Final     Recent Labs: Lab Results  Component Value Date   WBC 12.8 (H) 07/01/2021   HGB 11.8 (L) 07/01/2021   PLT 289 07/01/2021   NA 136 07/01/2021   K 4.1 07/01/2021   CL 108 07/01/2021   CO2 24 07/01/2021   GLUCOSE 113 (H) 07/01/2021   BUN 28 (H) 07/01/2021   CREATININE 0.44 07/01/2021   BILITOT 0.4 06/30/2021   ALKPHOS 173 (H) 06/30/2021   AST 22 06/30/2021   ALT 33 06/30/2021   PROT 6.3 (L) 06/30/2021   ALBUMIN 3.5 06/30/2021   CALCIUM 8.7 (L) 07/01/2021   GFRAA 120 12/01/2020   QFTBGOLDPLUS NEGATIVE 01/02/2021    Speciality Comments: Actemra started 03/12/21 (receives through [employer] patient assistance)  Procedures:  No procedures performed Allergies: Aldomet [methyldopa], Fosamax [alendronate sodium], Acetazolamide, and Buspar [buspirone]   Assessment / Plan:     Visit Diagnoses: No diagnosis found.  ***  Orders: No orders of the defined types were placed in this encounter.  No orders of the defined types were placed in this encounter.    Follow-Up Instructions: No follow-ups on file.   Collier Salina, MD  Note - This record has been created using [employer].  Chart creation errors have been sought, but may not always  have been located. Such creation errors do not reflect on  the standard of medical care.

## 2021-07-01 NOTE — Plan of Care (Signed)
  Problem: Education: Goal: Knowledge of General Education information will improve Description: Including pain rating scale, medication(s)/side effects and non-pharmacologic comfort measures 07/01/2021 0602 by Santa Lighter, RN Outcome: Progressing 06/30/2021 2219 by Santa Lighter, RN Outcome: Progressing   Problem: Health Behavior/Discharge Planning: Goal: Ability to manage health-related needs will improve 07/01/2021 0602 by Santa Lighter, RN Outcome: Progressing 06/30/2021 2219 by Santa Lighter, RN Outcome: Progressing   Problem: Clinical Measurements: Goal: Ability to maintain clinical measurements within normal limits will improve Outcome: Progressing Goal: Will remain free from infection Outcome: Progressing   Problem: Education: Goal: Knowledge of disease or condition will improve Outcome: Progressing Goal: Knowledge of secondary prevention will improve Outcome: Progressing Goal: Knowledge of patient specific risk factors addressed and post discharge goals established will improve Outcome: Progressing

## 2021-07-01 NOTE — Discharge Summary (Addendum)
Physician Discharge Summary  Mckenzie [name] MBW:[id number] DOB: [birth year] DOA: 06/25/2021  PCP: Loman Brooklyn, FNP  Admit date: 06/25/2021 Discharge date: 07/01/2021  Admitted From: Home Disposition:  Home   Recommendations for Outpatient Follow-up:  Follow up with PCP in 1-2 weeks Please obtain BMP/CBC in one week Home Health:[facility] Equipment/Devices:already has WC at home  Discharge Condition: Stable CODE STATUS:FULL Diet recommendation: Heart Healthy   Brief/Interim Summary: 75 year old with history of depression, HLD, HTN, temporal arteritis, polymyalgia, stroke came to the ED with complaints of slurred speech, confusion and right arm weakness.  The symptoms lasted for about 30 minutes.  Upon admission patient had CT of the head done which was unremarkable.  MRI of the brain was positive for acute CVA, neurology team was consulted as well.  No large vessel occlusion were noted.  Neurology recommended obtaining EEG, which was negative. LP was performed per neuro on 9/22 which was negative but due to concerns of possible autoimmune disease, started on high-dose steroids.  She was given 1 gram solumedrol on 9/22 and 9/23.  Although she was less agitated, she remained pleasantly confused.  On 07/01/21, mental status continued to improve.  Although not at baseline, family felt comfortable taking patient home as they recognized that there is likely a component of hospital delirium and understand she would likely continue to improve at home.  Discharge Diagnoses:  Acute CVA, inferior left frontal lobe Acute encephalopathy likely following CVA with some delirium -Mentation has improved but remains pleasantly confused -MRI brain without contrast and MRA head and neck without contrast is positive for acute CVA but negative for any acute LVO -Continue aspirin, defer addition of Plavix to neurology -Echocardiogram-EF 60 to 65%, grade 1 DD. -LDL 188.  TSH normal, A1c 5.2 -Statin -Neurology  following - EEG-neg, LP performed per neurology 9/22 which was negative.  No cell cultures were ordered but low suspicion for infection.  Started on Solu-Medrol 1 g every 24 hours for 2 days thereafter on p.o. steroids.  Her second doses tonight. -UA and Ammonia - neg -her delirium at this point is multifactorial including hypertensive encephalopathy (SBP 200s initially) day-night reversion, hospital delirium, IV steroids, possible with underlying degree of cognitive impairment (?vascular dementia with prior ischemic infarcts and evidence of sm vessel disease) -she would benefit to having neuropsychiatric testing in outpatient setting (referral has been made for outpatient appointment) -patient has had numerous hospitalizations in past 12 months and her neurologist Retail banker) noted on his 11/14/20 note that "etiology of cognitive change is uncertain" -daughter had stated previous that patient had cognitive decline through March 2022;  since then the medical record seems to reflect that patient has had waxing and waning episodes of confusion and cognitive changes which have been partly attributable to metabolic abnormalities and depression -continue ASA and plavix for a month, then plavix monotherapy -VDRL, cryptococcus--neg -B12--684 -Folate 30.7 -TSH 1.907 -ammonia 13 -ESR 15 -CRP 0.5 -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 SBP >230 - Improved.  Continue lisinopril, Norvasc 5 mg daily.  IV hydralazine as needed   Polymyalgia rheumatica/Giant Cell Arteritis - On outpatient prednisone and getting Actemra.  Follows with rheumatology, family and patient seems to the Actemra is driving her blood pressure significantly high.  She has rheumatology (Dr. Benjamine Mola) appointment outpatient on Monday  Depression - Lexapro     Discharge Instructions  Discharge Instructions     Ambulatory referral to Neuropsychology   Complete by: As directed       Allergies as of 07/01/2021       Reactions   Aldomet [methyldopa] Other (See Comments)   flu   Fosamax [alendronate Sodium]    Acetazolamide Rash   Buspar [buspirone] Palpitations        Medication List     STOP taking these medications    tolterodine 2 MG 24 hr capsule Commonly known as: Detrol LA       TAKE these medications    acetaminophen 325 MG tablet Commonly known as: TYLENOL Take 2 tablets (650 mg total) by mouth every 6 (six) hours as needed for mild pain (or Fever >/= 101).   Actemra ACTPen 162 MG/0.9ML Soaj Generic drug: Tocilizumab Inject 162 mg into the skin once a week.   amLODipine 5 MG tablet Commonly known as: NORVASC Take 1 tablet (5 mg total) by mouth daily. Start taking on: July 02, 2021   aspirin 81 MG EC tablet Take 1 tablet (81 mg total) by mouth daily. X one month, then stop Start taking on: July 02, 2021 What changed:  how much to take when to take this additional instructions   atorvastatin 40 MG tablet Commonly known as: LIPITOR Take 1 tablet (40 mg total) by mouth daily. Start taking on: July 02, 2021   Bromfenac Sodium 0.07 % Soln Apply 1 drop to eye daily. 1 drop right eye daily   clopidogrel 75 MG tablet Commonly known as: PLAVIX Take 1 tablet (75 mg total) by mouth daily with breakfast. Start taking on: July 02, 2021   Combigan 0.2-0.5 % ophthalmic solution Generic drug: brimonidine-timolol Place 1 drop into both eyes every 12 (twelve) hours.   dorzolamide 2 % ophthalmic solution Commonly known as: TRUSOPT Place 1 drop into both eyes 2 (two) times daily.   escitalopram 20 MG tablet Commonly known as: LEXAPRO Take 1 tablet (20 mg total) by mouth daily.   ferrous sulfate 325 (65 FE) MG tablet Take 325 mg by mouth every other day.    fluorometholone 0.1 % ophthalmic suspension Commonly known as: FML Place 1 drop into the right eye 4 (four) times daily.   furosemide 20 MG tablet Commonly known as: LASIX Take 10 mg by mouth daily.   latanoprost 0.005 % ophthalmic solution Commonly known as: XALATAN Place 1 drop into both eyes at bedtime.   lisinopril 10 MG tablet Commonly known as: ZESTRIL Take 1 tablet (10 mg total) by mouth daily.   MAGNESIUM PO Take 1 tablet by mouth daily.   multivitamin tablet Take 1 tablet by mouth daily.   predniSONE 5 MG tablet Commonly known as: DELTASONE Take 2 tablets (10 mg) daily What changed: how much to take   Rhopressa 0.02 % Soln Generic drug: Netarsudil Dimesylate Place 1 drop into both eyes at bedtime.   sodium chloride 1 g tablet Take 1 g by mouth 2 (two) times daily with a meal.   Vitamin D2 50 MCG (2000 UT) Tabs Take 1 tablet by mouth daily.        Follow-up Information     Home, Medi Follow up.   Why: PT will call to set up your home visit. Contact information: [address] [place] [address] [phone]                Allergies  Allergen Reactions  Aldomet [Methyldopa] Other (See Comments)    flu   Fosamax [Alendronate Sodium]    Acetazolamide Rash   Buspar [Buspirone] Palpitations    Consultations: neurology   Procedures/Studies: CT HEAD WO CONTRAST (5MM)  Result Date: 07/01/2021 CLINICAL DATA:  Stroke suggested scratch set suspect stroke. Neurologic deficit. EXAM: CT HEAD WITHOUT CONTRAST TECHNIQUE: Contiguous axial images were obtained from the base of the skull through the vertex without intravenous contrast. COMPARISON:  Brain MRI 06/26/2021 FINDINGS: Brain: No evidence of acute infarction, hemorrhage, hydrocephalus, extra-axial collection or mass lesion/mass effect. Remote bilateral cerebellar hemisphere infarcts identified, left greater than right. There is mild diffuse low-attenuation within the subcortical and  periventricular white matter compatible with chronic microvascular disease. Prominence of the sulci and ventricles compatible with brain atrophy. Vascular: No hyperdense vessel or unexpected calcification. Skull: Normal. Negative for fracture or focal lesion. Sinuses/Orbits: No acute finding. Other: None IMPRESSION: 1. No acute intracranial abnormalities. 2. 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 3. Remote bilateral cerebellar hemisphere infarcts. Electronically Signed   By: Kerby Moors M.D.   On: 07/01/2021 10:55   CT HEAD WO CONTRAST (5MM)  Result Date: 06/25/2021 CLINICAL DATA:  Altered mental status. EXAM: CT HEAD WITHOUT CONTRAST TECHNIQUE: Contiguous axial images were obtained from the base of the skull through the vertex without intravenous contrast. COMPARISON:  December 08, 2020 FINDINGS: Brain: There is mild cerebral atrophy with widening of the extra-axial spaces and ventricular dilatation. There are areas of decreased attenuation within the white matter tracts of the supratentorial brain, consistent with microvascular disease changes. Small areas of cortical encephalomalacia, with adjacent chronic white matter low attenuation are seen within the bilateral cerebellar regions and parasagittal region of the right occipital lobe. Vascular: No hyperdense vessel or unexpected calcification. Skull: Normal. Negative for fracture or focal lesion. Sinuses/Orbits: No acute finding. Other: None. IMPRESSION: 1. No acute intracranial abnormality. 2. Generalized cerebral atrophy. 3. 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 Electronically Signed   By: Virgina Norfolk M.D.   On: 06/25/2021 23:34   MR ANGIO HEAD WO CONTRAST  Result Date: 06/26/2021 CLINICAL DATA:  Neuro deficit, acute, stroke suspected EXAM: MRI HEAD WITHOUT CONTRAST MRA HEAD WITHOUT CONTRAST TECHNIQUE: Multiplanar, multi-echo pulse sequences of the brain and surrounding structures were acquired without  intravenous contrast. Angiographic images of the Circle of Willis were acquired using MRA technique without intravenous contrast. COMPARISON:  No pertinent prior exam. FINDINGS: MRI HEAD Brain: Punctate focus of mildly reduced diffusion in the inferior left frontal lobe (series 11, image 16). There is no acute infarction or intracranial hemorrhage. There is no intracranial mass or mass effect. There is no hydrocephalus or extra-axial fluid collection. Chronic bilateral cerebellar infarcts. Chronic right occipital infarct. Patchy and confluent areas of T2 hyperintensity in the supratentorial and pontine white matter are nonspecific but may reflect moderate chronic microvascular ischemic changes. Vascular: Major vessel flow voids at the skull base are preserved. Skull and upper cervical spine: Normal marrow signal is preserved. Sinuses/Orbits: Paranasal sinuses are aerated. Orbits are unremarkable. Other: Sella is unremarkable.  Mastoid air cells are clear. MRA HEAD Motion artifact present. Intracranial internal carotid arteries are patent with atherosclerotic irregularity. Middle and anterior cerebral arteries are patent. Intracranial vertebral arteries, basilar artery, posterior cerebral arteries are patent. There is no significant stenosis. IMPRESSION: Punctate acute infarct inferior left frontal lobe. Chronic right occipital and bilateral cerebellar infarcts. Moderate chronic microvascular ischemic changes. No proximal intracranial vessel occlusion or significant stenosis. Electronically Signed  By: Macy Mis M.D.   On: 06/26/2021 09:49   MR BRAIN WO CONTRAST  Result Date: 06/26/2021 CLINICAL DATA:  Neuro deficit, acute, stroke suspected EXAM: MRI HEAD WITHOUT CONTRAST MRA HEAD WITHOUT CONTRAST TECHNIQUE: Multiplanar, multi-echo pulse sequences of the brain and surrounding structures were acquired without intravenous contrast. Angiographic images of the Circle of Willis were acquired using MRA technique  without intravenous contrast. COMPARISON:  No pertinent prior exam. FINDINGS: MRI HEAD Brain: Punctate focus of mildly reduced diffusion in the inferior left frontal lobe (series 11, image 16). There is no acute infarction or intracranial hemorrhage. There is no intracranial mass or mass effect. There is no hydrocephalus or extra-axial fluid collection. Chronic bilateral cerebellar infarcts. Chronic right occipital infarct. Patchy and confluent areas of T2 hyperintensity in the supratentorial and pontine white matter are nonspecific but may reflect moderate chronic microvascular ischemic changes. Vascular: Major vessel flow voids at the skull base are preserved. Skull and upper cervical spine: Normal marrow signal is preserved. Sinuses/Orbits: Paranasal sinuses are aerated. Orbits are unremarkable. Other: Sella is unremarkable.  Mastoid air cells are clear. MRA HEAD Motion artifact present. Intracranial internal carotid arteries are patent with atherosclerotic irregularity. Middle and anterior cerebral arteries are patent. Intracranial vertebral arteries, basilar artery, posterior cerebral arteries are patent. There is no significant stenosis. IMPRESSION: Punctate acute infarct inferior left frontal lobe. Chronic right occipital and bilateral cerebellar infarcts. Moderate chronic microvascular ischemic changes. No proximal intracranial vessel occlusion or significant stenosis. Electronically Signed   By: Macy Mis M.D.   On: 06/26/2021 09:49   DG Chest Portable 1 View  Result Date: 06/25/2021 CLINICAL DATA:  Altered mental status. EXAM: PORTABLE CHEST 1 VIEW COMPARISON:  Chest radiograph dated 05/23/2021. FINDINGS: Focal consolidation, pleural effusion or pneumothorax mild cardiomegaly. No acute osseous pathology. With degenerative changes of the spine and shoulders. IMPRESSION: No active cardiopulmonary disease. Electronically Signed   By: Anner Crete M.D.   On: 06/25/2021 23:42   EEG  adult  Result Date: 06/27/2021 Phillips Odor, MD     06/27/2021  6:26 PM [place] A. [name], MD     [email or web address]       HISTORY: The patient is 35 and presents with altered mental status. 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 MEDICATIONS: Current Facility-Administered Medications:   acetaminophen (TYLENOL) tablet 650 mg, 650 mg, Oral, Q4H PRN **OR** acetaminophen (TYLENOL) 160 MG/5ML solution 650 mg, 650 mg, Per Tube, Q4H PRN **OR** acetaminophen (TYLENOL) suppository 650 mg, 650 mg, Rectal, Q4H PRN, Zierle-Ghosh, Asia B, DO   amLODipine (NORVASC) tablet 5 mg, 5 mg, Oral, Daily, Amin, Ankit Chirag, MD, 5 mg at 06/27/21 1126   aspirin EC tablet 81 mg, 81 mg, Oral, Daily, Zierle-Ghosh, Asia B, DO, 81 mg at 06/27/21 1124   atorvastatin (LIPITOR) tablet 40 mg, 40 mg, Oral, Daily, Amin, Ankit Chirag, MD, 40 mg at 06/27/21 1125   brimonidine (ALPHAGAN) 0.2 % ophthalmic solution 1 drop, 1 drop, Both Eyes, BID, 1 drop at 06/27/21 1212 **AND** timolol (TIMOPTIC) 0.5 % ophthalmic solution 1 drop, 1 drop, Both Eyes, BID, Hall, Scott A, RPH, 1 drop at 06/27/21 1217   clopidogrel (PLAVIX) tablet 75 mg, 75 mg, Oral, Q breakfast, [place], Kofi, MD, 75 mg at 06/27/21 1125   dorzolamide (TRUSOPT) 2 % ophthalmic solution 1 drop, 1 drop, Both Eyes, BID, Amin, Ankit Chirag, MD, 1 drop at 06/27/21 1221   escitalopram (LEXAPRO) tablet 20 mg, 20 mg, Oral, Daily, Zierle-Ghosh, Asia B, DO,  20 mg at 06/27/21 1123   fluorometholone (FML) 0.1 % ophthalmic suspension 1 drop, 1 drop, Right Eye, QID, Amin, Ankit Chirag, MD, 1 drop at 06/27/21 1219   haloperidol (HALDOL) tablet 1 mg, 1 mg, Oral, Q6H PRN **OR** haloperidol lactate (HALDOL) injection 2 mg, 2 mg, Intramuscular, Q6H PRN, Amin, Ankit Chirag, MD   heparin injection 5,000 Units, 5,000 Units, Subcutaneous, Q8H, Zierle-Ghosh, Asia B, DO, 5,000 Units at 06/27/21 0518   hydrALAZINE (APRESOLINE) injection 10 mg, 10 mg,  Intravenous, Q4H PRN, Amin, Ankit Chirag, MD, 10 mg at 06/26/21 1136   ipratropium-albuterol (DUONEB) 0.5-2.5 (3) MG/3ML nebulizer solution 3 mL, 3 mL, Nebulization, Q4H PRN, Amin, Ankit Chirag, MD   ketorolac (ACULAR) 0.5 % ophthalmic solution 1 drop, 1 drop, Right Eye, Daily, Amin, Ankit Chirag, MD   latanoprost (XALATAN) 0.005 % ophthalmic solution 1 drop, 1 drop, Both Eyes, QHS, Amin, Ankit Chirag, MD, 1 drop at 06/26/21 2303   lisinopril (ZESTRIL) tablet 10 mg, 10 mg, Oral, Daily, Amin, Ankit Chirag, MD, 10 mg at 06/27/21 1125   metoprolol tartrate (LOPRESSOR) injection 5 mg, 5 mg, Intravenous, Q4H PRN, Amin, Ankit Chirag, MD   Netarsudil Dimesylate 0.02 % SOLN 1 drop, 1 drop, Both Eyes, QHS, Amin, Ankit Chirag, MD   predniSONE (DELTASONE) tablet 10 mg, 10 mg, Oral, Q breakfast, Zierle-Ghosh, Asia B, DO, 10 mg at 06/27/21 1214   senna-docusate (Senokot-S) tablet 1 tablet, 1 tablet, Oral, QHS PRN, Zierle-Ghosh, Asia B, DO   traZODone (DESYREL) tablet 50 mg, 50 mg, Oral, QHS PRN, Amin, Ankit Chirag, MD ANALYSIS: A 16 channel recording using standard 10 20 measurements is conducted for 23 minutes.  The background activity gets as high as 6-7 hertz. There is beta activity observed in the frontal areas. Most of the recording occurs during sleep with K complexes, spindles and even slow-wave activity. There is no focal or lateral slowing. There is no epileptiform activities noted. IMPRESSION:  1.  This recording of the awake and sleep states shows some mild to moderate global slowing. However, no epileptiform activities are noted. Kofi A. [name], M.D. Diplomate, Tax adviser of Psychiatry and Neurology ( Neurology).   DG FLUORO GUIDED NEEDLE PLC ASPIRATION/INJECTION LOC  Result Date: 06/28/2021 CLINICAL DATA:  Altered mental status EXAM: DIAGNOSTIC LUMBAR PUNCTURE UNDER FLUOROSCOPIC GUIDANCE COMPARISON:  CT head 06/25/2021 FLUOROSCOPY TIME:  Fluoroscopy Time:  0 minutes 12 seconds Radiation Exposure Index (if  provided by the fluoroscopic device): 1.6 mGy Number of Acquired Spot Images: 1 PROCEDURE: Procedure, benefits, and risks were discussed with the patient, including alternatives. Patient's questions were answered. Written informed consent was obtained. Timeout protocol followed. Patient placed prone. L4-L5 disc space was localized under fluoroscopy. Skin prepped and draped in usual sterile fashion. Skin and soft tissues anesthetized with 4 mL of 1% lidocaine. 22 gauge needle was advanced into the spinal canal where clear colorless CSF was encountered with an opening pressure of 9 cm H2O (LLD). 10 mL of CSF was obtained in 4 tubes for requested analysis. Procedure tolerated very well by patient without immediate complication. IMPRESSION: Successful lumbar puncture as above Electronically Signed   By: Lavonia Dana M.D.   On: 06/28/2021 14:34   ECHOCARDIOGRAM COMPLETE  Result Date: 06/27/2021    ECHOCARDIOGRAM REPORT   Patient Name:   Mckenzie [name] Date of Exam: 06/27/2021 Medical Rec #:  [id number]         Height:       65.0 in Accession #:    [id number]  Weight:       130.1 lb Date of Birth:  09-Jan-1946          BSA:          1.648 m Patient Age:    75 years          BP:           168/101 mmHg Patient Gender: F                 HR:           77 bpm. Exam Location:  [facility] Procedure: 2D Echo, Cardiac Doppler and Color Doppler Indications:    TIA  History:        Patient has prior history of Echocardiogram examinations, most                 recent 12/12/2020. TIA; Risk Factors:Hypertension and                 Dyslipidemia. Pericardial effusion.  Sonographer:    Wenda Low Referring Phys: [id number] ASIA B [place]  1. Left ventricular ejection fraction, by estimation, is 60 to 65%. The left ventricle has normal function. The left ventricle has no regional wall motion abnormalities. There is mild left ventricular hypertrophy. Left ventricular diastolic parameters are consistent with Grade I  diastolic dysfunction (impaired relaxation).  2. Right ventricular systolic function is normal. The right ventricular size is normal. Tricuspid regurgitation signal is inadequate for assessing PA pressure.  3. The mitral valve is normal in structure. Mild mitral valve regurgitation. No evidence of mitral stenosis.  4. The aortic valve is tricuspid. Aortic valve regurgitation is not visualized. No aortic stenosis is present. FINDINGS  Left Ventricle: Left ventricular ejection fraction, by estimation, is 60 to 65%. The left ventricle has normal function. The left ventricle has no regional wall motion abnormalities. The left ventricular internal cavity size was normal in size. There is  mild left ventricular hypertrophy. Left ventricular diastolic parameters are consistent with Grade I diastolic dysfunction (impaired relaxation). Normal left ventricular filling pressure. Right Ventricle: The right ventricular size is normal. No increase in right ventricular wall thickness. Right ventricular systolic function is normal. Tricuspid regurgitation signal is inadequate for assessing PA pressure. Left Atrium: Left atrial size was normal in size. Right Atrium: Right atrial size was normal in size. Pericardium: There is no evidence of pericardial effusion. Mitral Valve: The mitral valve is normal in structure. Mild mitral valve regurgitation. No evidence of mitral valve stenosis. MV peak gradient, 3.0 mmHg. The mean mitral valve gradient is 1.0 mmHg. Tricuspid Valve: The tricuspid valve is normal in structure. Tricuspid valve regurgitation is trivial. No evidence of tricuspid stenosis. Aortic Valve: The aortic valve is tricuspid. Aortic valve regurgitation is not visualized. No aortic stenosis is present. Aortic valve mean gradient measures 4.0 mmHg. Aortic valve peak gradient measures 7.4 mmHg. Aortic valve area, by VTI measures 1.63 cm. Pulmonic Valve: The pulmonic valve was not well visualized. Pulmonic valve regurgitation  is not visualized. No evidence of pulmonic stenosis. Aorta: The aortic root is normal in size and structure. Venous: The inferior vena cava was not well visualized. IAS/Shunts: No atrial level shunt detected by color flow Doppler.  LEFT VENTRICLE PLAX 2D LVIDd:         4.20 cm     Diastology LVIDs:         2.90 cm     LV e' medial:   6.53 cm/s LV PW:  1.00 cm     LV E/e' medial: 7.1 LV IVS:        1.20 cm LVOT diam:     1.90 cm LV SV:         43 LV SV Index:   26 LVOT Area:     2.84 cm  LV Volumes (MOD) LV vol d, MOD A2C: 59.7 ml LV vol d, MOD A4C: 69.4 ml LV vol s, MOD A2C: 32.4 ml LV vol s, MOD A4C: 31.4 ml LV SV MOD A2C:     27.3 ml LV SV MOD A4C:     69.4 ml LV SV MOD BP:      35.6 ml RIGHT VENTRICLE RV Basal diam:  2.90 cm RV Mid diam:    2.90 cm RV S prime:     10.00 cm/s TAPSE (M-mode): 1.8 cm LEFT ATRIUM             Index       RIGHT ATRIUM           Index LA diam:        2.70 cm 1.64 cm/m  RA Area:     13.30 cm LA Vol (A2C):   36.7 ml 22.28 ml/m RA Volume:   28.70 ml  17.42 ml/m LA Vol (A4C):   32.2 ml 19.54 ml/m LA Biplane Vol: 34.7 ml 21.06 ml/m  AORTIC VALVE                   PULMONIC VALVE AV Area (Vmax):    1.68 cm    PV Vmax:       1.11 m/s AV Area (Vmean):   1.61 cm    PV Peak grad:  4.9 mmHg AV Area (VTI):     1.63 cm AV Vmax:           136.00 cm/s AV Vmean:          96.800 cm/s AV VTI:            0.263 m AV Peak Grad:      7.4 mmHg AV Mean Grad:      4.0 mmHg LVOT Vmax:         80.70 cm/s LVOT Vmean:        54.900 cm/s LVOT VTI:          0.151 m LVOT/AV VTI ratio: 0.57  AORTA Ao Root diam: 2.70 cm Ao Asc diam:  3.20 cm MITRAL VALVE MV Area (PHT): 3.61 cm    SHUNTS MV Area VTI:   2.48 cm    Systemic VTI:  0.15 m MV Peak grad:  3.0 mmHg    Systemic Diam: 1.90 cm MV Mean grad:  1.0 mmHg MV Vmax:       0.86 m/s MV Vmean:      47.8 cm/s MV Decel Time: 210 msec MV E velocity: 46.40 cm/s MV A velocity: 77.90 cm/s MV E/A ratio:  0.60 Carlyle Dolly MD Electronically signed by Carlyle Dolly MD Signature Date/Time: 06/27/2021/12:51:49 PM    Final         Discharge Exam: Vitals:   07/01/21 0455 07/01/21 0834  BP: (!) 158/63 (!) 154/59  Pulse: 84 85  Resp: 19   Temp: 98.2 F (36.8 C)   SpO2: 94% 97%   Vitals:   06/30/21 0551 06/30/21 2119 07/01/21 0455 07/01/21 0834  BP: (!) 191/86 (!) 154/68 (!) 158/63 (!) 154/59  Pulse: 83 83 84 85  Resp: 19 19 19  Temp: 97.6 F (36.4 C) 98 F (36.7 C) 98.2 F (36.8 C)   TempSrc: Oral Oral Oral   SpO2: 98% 96% 94% 97%  Weight:      Height:        General: Pt is alert, awake, not in acute distress Cardiovascular: RRR, S1/S2 +, no rubs, no gallops Respiratory: CTA bilaterally, no wheezing, no rhonchi Abdominal: Soft, NT, ND, bowel sounds + Extremities: no edema, no cyanosis   The results of significant diagnostics from this hospitalization (including imaging, microbiology, ancillary and laboratory) are listed below for reference.    Significant Diagnostic Studies: CT HEAD WO CONTRAST (5MM)  Result Date: 07/01/2021 CLINICAL DATA:  Stroke suggested scratch set suspect stroke. Neurologic deficit. EXAM: CT HEAD WITHOUT CONTRAST TECHNIQUE: Contiguous axial images were obtained from the base of the skull through the vertex without intravenous contrast. COMPARISON:  Brain MRI 06/26/2021 FINDINGS: Brain: No evidence of acute infarction, hemorrhage, hydrocephalus, extra-axial collection or mass lesion/mass effect. Remote bilateral cerebellar hemisphere infarcts identified, left greater than right. There is mild diffuse low-attenuation within the subcortical and periventricular white matter compatible with chronic microvascular disease. Prominence of the sulci and ventricles compatible with brain atrophy. Vascular: No hyperdense vessel or unexpected calcification. Skull: Normal. Negative for fracture or focal lesion. Sinuses/Orbits: No acute finding. Other: None IMPRESSION: 1. No acute intracranial abnormalities. 2. Chronic small  vessel ischemic disease and brain atrophy. 3. Remote bilateral cerebellar hemisphere infarcts. Electronically Signed   By: Kerby Moors M.D.   On: 07/01/2021 10:55   CT HEAD WO CONTRAST (5MM)  Result Date: 06/25/2021 CLINICAL DATA:  Altered mental status. EXAM: CT HEAD WITHOUT CONTRAST TECHNIQUE: Contiguous axial images were obtained from the base of the skull through the vertex without intravenous contrast. COMPARISON:  December 08, 2020 FINDINGS: Brain: There is mild cerebral atrophy with widening of the extra-axial spaces and ventricular dilatation. There are areas of decreased attenuation within the white matter tracts of the supratentorial brain, consistent with microvascular disease changes. Small areas of cortical encephalomalacia, with adjacent chronic white matter low attenuation are seen within the bilateral cerebellar regions and parasagittal region of the right occipital lobe. Vascular: No hyperdense vessel or unexpected calcification. Skull: Normal. Negative for fracture or focal lesion. Sinuses/Orbits: No acute finding. Other: None. IMPRESSION: 1. No acute intracranial abnormality. 2. Generalized cerebral atrophy. 3. 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 Electronically Signed   By: Virgina Norfolk M.D.   On: 06/25/2021 23:34   MR ANGIO HEAD WO CONTRAST  Result Date: 06/26/2021 CLINICAL DATA:  Neuro deficit, acute, stroke suspected EXAM: MRI HEAD WITHOUT CONTRAST MRA HEAD WITHOUT CONTRAST TECHNIQUE: Multiplanar, multi-echo pulse sequences of the brain and surrounding structures were acquired without intravenous contrast. Angiographic images of the Circle of Willis were acquired using MRA technique without intravenous contrast. COMPARISON:  No pertinent prior exam. FINDINGS: MRI HEAD Brain: Punctate focus of mildly reduced diffusion in the inferior left frontal lobe (series 11, image 16). There is no acute infarction or intracranial hemorrhage. There is no intracranial  mass or mass effect. There is no hydrocephalus or extra-axial fluid collection. Chronic bilateral cerebellar infarcts. Chronic right occipital infarct. Patchy and confluent areas of T2 hyperintensity in the supratentorial and pontine white matter are nonspecific but may reflect moderate chronic microvascular ischemic changes. Vascular: Major vessel flow voids at the skull base are preserved. Skull and upper cervical spine: Normal marrow signal is preserved. Sinuses/Orbits: Paranasal sinuses are aerated. Orbits are unremarkable. Other: Sella is unremarkable.  Mastoid  air cells are clear. MRA HEAD Motion artifact present. Intracranial internal carotid arteries are patent with atherosclerotic irregularity. Middle and anterior cerebral arteries are patent. Intracranial vertebral arteries, basilar artery, posterior cerebral arteries are patent. There is no significant stenosis. IMPRESSION: Punctate acute infarct inferior left frontal lobe. Chronic right occipital and bilateral cerebellar infarcts. Moderate chronic microvascular ischemic changes. No proximal intracranial vessel occlusion or significant stenosis. Electronically Signed   By: Macy Mis M.D.   On: 06/26/2021 09:49   MR BRAIN WO CONTRAST  Result Date: 06/26/2021 CLINICAL DATA:  Neuro deficit, acute, stroke suspected EXAM: MRI HEAD WITHOUT CONTRAST MRA HEAD WITHOUT CONTRAST TECHNIQUE: Multiplanar, multi-echo pulse sequences of the brain and surrounding structures were acquired without intravenous contrast. Angiographic images of the Circle of Willis were acquired using MRA technique without intravenous contrast. COMPARISON:  No pertinent prior exam. FINDINGS: MRI HEAD Brain: Punctate focus of mildly reduced diffusion in the inferior left frontal lobe (series 11, image 16). There is no acute infarction or intracranial hemorrhage. There is no intracranial mass or mass effect. There is no hydrocephalus or extra-axial fluid collection. Chronic bilateral  cerebellar infarcts. Chronic right occipital infarct. Patchy and confluent areas of T2 hyperintensity in the supratentorial and pontine white matter are nonspecific but may reflect moderate chronic microvascular ischemic changes. Vascular: Major vessel flow voids at the skull base are preserved. Skull and upper cervical spine: Normal marrow signal is preserved. Sinuses/Orbits: Paranasal sinuses are aerated. Orbits are unremarkable. Other: Sella is unremarkable.  Mastoid air cells are clear. MRA HEAD Motion artifact present. Intracranial internal carotid arteries are patent with atherosclerotic irregularity. Middle and anterior cerebral arteries are patent. Intracranial vertebral arteries, basilar artery, posterior cerebral arteries are patent. There is no significant stenosis. IMPRESSION: Punctate acute infarct inferior left frontal lobe. Chronic right occipital and bilateral cerebellar infarcts. Moderate chronic microvascular ischemic changes. No proximal intracranial vessel occlusion or significant stenosis. Electronically Signed   By: Macy Mis M.D.   On: 06/26/2021 09:49   DG Chest Portable 1 View  Result Date: 06/25/2021 CLINICAL DATA:  Altered mental status. EXAM: PORTABLE CHEST 1 VIEW COMPARISON:  Chest radiograph dated 05/23/2021. FINDINGS: Focal consolidation, pleural effusion or pneumothorax mild cardiomegaly. No acute osseous pathology. With degenerative changes of the spine and shoulders. IMPRESSION: No active cardiopulmonary disease. Electronically Signed   By: Anner Crete M.D.   On: 06/25/2021 23:42   EEG adult  Result Date: 06/27/2021 Phillips Odor, MD     06/27/2021  6:26 PM [place] A. [name], MD     [email or web address]       HISTORY: The patient is 69 and presents with altered mental status. 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 MEDICATIONS: Current Facility-Administered Medications:   acetaminophen (TYLENOL) tablet 650  mg, 650 mg, Oral, Q4H PRN **OR** acetaminophen (TYLENOL) 160 MG/5ML solution 650 mg, 650 mg, Per Tube, Q4H PRN **OR** acetaminophen (TYLENOL) suppository 650 mg, 650 mg, Rectal, Q4H PRN, Zierle-Ghosh, Asia B, DO   amLODipine (NORVASC) tablet 5 mg, 5 mg, Oral, Daily, Amin, Ankit Chirag, MD, 5 mg at 06/27/21 1126   aspirin EC tablet 81 mg, 81 mg, Oral, Daily, Zierle-Ghosh, Asia B, DO, 81 mg at 06/27/21 1124   atorvastatin (LIPITOR) tablet 40 mg, 40 mg, Oral, Daily, Amin, Ankit Chirag, MD, 40 mg at 06/27/21 1125   brimonidine (ALPHAGAN) 0.2 % ophthalmic solution 1 drop, 1 drop, Both Eyes, BID, 1 drop at 06/27/21 1212 **AND** timolol (TIMOPTIC) 0.5 % ophthalmic solution 1 drop, 1  drop, Both Eyes, BID, Hall, Scott A, RPH, 1 drop at 06/27/21 1217   clopidogrel (PLAVIX) tablet 75 mg, 75 mg, Oral, Q breakfast, [place], Kofi, MD, 75 mg at 06/27/21 1125   dorzolamide (TRUSOPT) 2 % ophthalmic solution 1 drop, 1 drop, Both Eyes, BID, Amin, Ankit Chirag, MD, 1 drop at 06/27/21 1221   escitalopram (LEXAPRO) tablet 20 mg, 20 mg, Oral, Daily, Zierle-Ghosh, Asia B, DO, 20 mg at 06/27/21 1123   fluorometholone (FML) 0.1 % ophthalmic suspension 1 drop, 1 drop, Right Eye, QID, Amin, Ankit Chirag, MD, 1 drop at 06/27/21 1219   haloperidol (HALDOL) tablet 1 mg, 1 mg, Oral, Q6H PRN **OR** haloperidol lactate (HALDOL) injection 2 mg, 2 mg, Intramuscular, Q6H PRN, Amin, Ankit Chirag, MD   heparin injection 5,000 Units, 5,000 Units, Subcutaneous, Q8H, Zierle-Ghosh, Asia B, DO, 5,000 Units at 06/27/21 0518   hydrALAZINE (APRESOLINE) injection 10 mg, 10 mg, Intravenous, Q4H PRN, Amin, Ankit Chirag, MD, 10 mg at 06/26/21 1136   ipratropium-albuterol (DUONEB) 0.5-2.5 (3) MG/3ML nebulizer solution 3 mL, 3 mL, Nebulization, Q4H PRN, Amin, Ankit Chirag, MD   ketorolac (ACULAR) 0.5 % ophthalmic solution 1 drop, 1 drop, Right Eye, Daily, Amin, Ankit Chirag, MD   latanoprost (XALATAN) 0.005 % ophthalmic solution 1 drop, 1 drop, Both Eyes, QHS, Amin,  Ankit Chirag, MD, 1 drop at 06/26/21 2303   lisinopril (ZESTRIL) tablet 10 mg, 10 mg, Oral, Daily, Amin, Ankit Chirag, MD, 10 mg at 06/27/21 1125   metoprolol tartrate (LOPRESSOR) injection 5 mg, 5 mg, Intravenous, Q4H PRN, Amin, Ankit Chirag, MD   Netarsudil Dimesylate 0.02 % SOLN 1 drop, 1 drop, Both Eyes, QHS, Amin, Ankit Chirag, MD   predniSONE (DELTASONE) tablet 10 mg, 10 mg, Oral, Q breakfast, Zierle-Ghosh, Asia B, DO, 10 mg at 06/27/21 1214   senna-docusate (Senokot-S) tablet 1 tablet, 1 tablet, Oral, QHS PRN, Zierle-Ghosh, Asia B, DO   traZODone (DESYREL) tablet 50 mg, 50 mg, Oral, QHS PRN, Amin, Ankit Chirag, MD ANALYSIS: A 16 channel recording using standard 10 20 measurements is conducted for 23 minutes.  The background activity gets as high as 6-7 hertz. There is beta activity observed in the frontal areas. Most of the recording occurs during sleep with K complexes, spindles and even slow-wave activity. There is no focal or lateral slowing. There is no epileptiform activities noted. IMPRESSION:  1.  This recording of the awake and sleep states shows some mild to moderate global slowing. However, no epileptiform activities are noted. [place]i A. [name], M.D. Diplomate, Tax adviser of Psychiatry and Neurology ( Neurology).   DG FLUORO GUIDED NEEDLE PLC ASPIRATION/INJECTION LOC  Result Date: 06/28/2021 CLINICAL DATA:  Altered mental status EXAM: DIAGNOSTIC LUMBAR PUNCTURE UNDER FLUOROSCOPIC GUIDANCE COMPARISON:  CT head 06/25/2021 FLUOROSCOPY TIME:  Fluoroscopy Time:  0 minutes 12 seconds Radiation Exposure Index (if provided by the fluoroscopic device): 1.6 mGy Number of Acquired Spot Images: 1 PROCEDURE: Procedure, benefits, and risks were discussed with the patient, including alternatives. Patient's questions were answered. Written informed consent was obtained. Timeout protocol followed. Patient placed prone. L4-L5 disc space was localized under fluoroscopy. Skin prepped and draped in usual sterile  fashion. Skin and soft tissues anesthetized with 4 mL of 1% lidocaine. 22 gauge needle was advanced into the spinal canal where clear colorless CSF was encountered with an opening pressure of 9 cm H2O (LLD). 10 mL of CSF was obtained in 4 tubes for requested analysis. Procedure tolerated very well by patient without immediate complication. IMPRESSION: Successful  lumbar puncture as above Electronically Signed   By: Lavonia Dana M.D.   On: 06/28/2021 14:34   ECHOCARDIOGRAM COMPLETE  Result Date: 06/27/2021    ECHOCARDIOGRAM REPORT   Patient Name:   Mckenzie [name] Date of Exam: 06/27/2021 Medical Rec #:  [id number]         Height:       65.0 in Accession #:    [id number]        Weight:       130.1 lb Date of Birth:  1946-04-09          BSA:          1.648 m Patient Age:    22 years          BP:           168/101 mmHg Patient Gender: F                 HR:           77 bpm. Exam Location:  [facility] Procedure: 2D Echo, Cardiac Doppler and Color Doppler Indications:    TIA  History:        Patient has prior history of Echocardiogram examinations, most                 recent 12/12/2020. TIA; Risk Factors:Hypertension and                 Dyslipidemia. Pericardial effusion.  Sonographer:    Wenda Low Referring Phys: [id number] ASIA B Banks  1. Left ventricular ejection fraction, by estimation, is 60 to 65%. The left ventricle has normal function. The left ventricle has no regional wall motion abnormalities. There is mild left ventricular hypertrophy. Left ventricular diastolic parameters are consistent with Grade I diastolic dysfunction (impaired relaxation).  2. Right ventricular systolic function is normal. The right ventricular size is normal. Tricuspid regurgitation signal is inadequate for assessing PA pressure.  3. The mitral valve is normal in structure. Mild mitral valve regurgitation. No evidence of mitral stenosis.  4. The aortic valve is tricuspid. Aortic valve regurgitation is not  visualized. No aortic stenosis is present. FINDINGS  Left Ventricle: Left ventricular ejection fraction, by estimation, is 60 to 65%. The left ventricle has normal function. The left ventricle has no regional wall motion abnormalities. The left ventricular internal cavity size was normal in size. There is  mild left ventricular hypertrophy. Left ventricular diastolic parameters are consistent with Grade I diastolic dysfunction (impaired relaxation). Normal left ventricular filling pressure. Right Ventricle: The right ventricular size is normal. No increase in right ventricular wall thickness. Right ventricular systolic function is normal. Tricuspid regurgitation signal is inadequate for assessing PA pressure. Left Atrium: Left atrial size was normal in size. Right Atrium: Right atrial size was normal in size. Pericardium: There is no evidence of pericardial effusion. Mitral Valve: The mitral valve is normal in structure. Mild mitral valve regurgitation. No evidence of mitral valve stenosis. MV peak gradient, 3.0 mmHg. The mean mitral valve gradient is 1.0 mmHg. Tricuspid Valve: The tricuspid valve is normal in structure. Tricuspid valve regurgitation is trivial. No evidence of tricuspid stenosis. Aortic Valve: The aortic valve is tricuspid. Aortic valve regurgitation is not visualized. No aortic stenosis is present. Aortic valve mean gradient measures 4.0 mmHg. Aortic valve peak gradient measures 7.4 mmHg. Aortic valve area, by VTI measures 1.63 cm. Pulmonic Valve: The pulmonic valve was not well visualized. Pulmonic valve regurgitation is not visualized. No evidence of  pulmonic stenosis. Aorta: The aortic root is normal in size and structure. Venous: The inferior vena cava was not well visualized. IAS/Shunts: No atrial level shunt detected by color flow Doppler.  LEFT VENTRICLE PLAX 2D LVIDd:         4.20 cm     Diastology LVIDs:         2.90 cm     LV e' medial:   6.53 cm/s LV PW:         1.00 cm     LV E/e'  medial: 7.1 LV IVS:        1.20 cm LVOT diam:     1.90 cm LV SV:         43 LV SV Index:   26 LVOT Area:     2.84 cm  LV Volumes (MOD) LV vol d, MOD A2C: 59.7 ml LV vol d, MOD A4C: 69.4 ml LV vol s, MOD A2C: 32.4 ml LV vol s, MOD A4C: 31.4 ml LV SV MOD A2C:     27.3 ml LV SV MOD A4C:     69.4 ml LV SV MOD BP:      35.6 ml RIGHT VENTRICLE RV Basal diam:  2.90 cm RV Mid diam:    2.90 cm RV S prime:     10.00 cm/s TAPSE (M-mode): 1.8 cm LEFT ATRIUM             Index       RIGHT ATRIUM           Index LA diam:        2.70 cm 1.64 cm/m  RA Area:     13.30 cm LA Vol (A2C):   36.7 ml 22.28 ml/m RA Volume:   28.70 ml  17.42 ml/m LA Vol (A4C):   32.2 ml 19.54 ml/m LA Biplane Vol: 34.7 ml 21.06 ml/m  AORTIC VALVE                   PULMONIC VALVE AV Area (Vmax):    1.68 cm    PV Vmax:       1.11 m/s AV Area (Vmean):   1.61 cm    PV Peak grad:  4.9 mmHg AV Area (VTI):     1.63 cm AV Vmax:           136.00 cm/s AV Vmean:          96.800 cm/s AV VTI:            0.263 m AV Peak Grad:      7.4 mmHg AV Mean Grad:      4.0 mmHg LVOT Vmax:         80.70 cm/s LVOT Vmean:        54.900 cm/s LVOT VTI:          0.151 m LVOT/AV VTI ratio: 0.57  AORTA Ao Root diam: 2.70 cm Ao Asc diam:  3.20 cm MITRAL VALVE MV Area (PHT): 3.61 cm    SHUNTS MV Area VTI:   2.48 cm    Systemic VTI:  0.15 m MV Peak grad:  3.0 mmHg    Systemic Diam: 1.90 cm MV Mean grad:  1.0 mmHg MV Vmax:       0.86 m/s MV Vmean:      47.8 cm/s MV Decel Time: 210 msec MV E velocity: 46.40 cm/s MV A velocity: 77.90 cm/s MV E/A ratio:  0.60 Carlyle Dolly MD Electronically signed by Carlyle Dolly MD Signature Date/Time:  06/27/2021/12:51:49 PM    Final     Microbiology: Recent Results (from the past 240 hour(s))  Resp Panel by RT-PCR (Flu A&B, Covid) Nasopharyngeal Swab     Status: None   Collection Time: 06/26/21 12:20 AM   Specimen: Nasopharyngeal Swab; Nasopharyngeal(NP) swabs in vial transport medium  Result Value Ref Range Status   SARS Coronavirus 2 by  RT PCR NEGATIVE NEGATIVE Final    Comment: (NOTE) SARS-CoV-2 target nucleic acids are NOT DETECTED.  The SARS-CoV-2 RNA is generally detectable in upper respiratory specimens during the acute phase of infection. The lowest concentration of SARS-CoV-2 viral copies this assay can detect is 138 copies/mL. A negative result does not preclude SARS-Cov-2 infection and should not be used as the sole basis for treatment or other patient management decisions. A negative result may occur with  improper specimen collection/handling, submission of specimen other than nasopharyngeal swab, presence of viral mutation(s) within the areas targeted by this assay, and inadequate number of viral copies(<138 copies/mL). A negative result must be combined with clinical observations, patient history, and epidemiological information. The expected result is Negative.  Fact Sheet for Patients:  EntrepreneurPulse.com.au  Fact Sheet for Healthcare Providers:  IncredibleEmployment.be  This test is no t yet approved or cleared by the [place] FDA and  has been authorized for detection and/or diagnosis of SARS-CoV-2 by FDA under an Emergency Use Authorization (EUA). This EUA will remain  in effect (meaning this test can be used) for the duration of the COVID-19 declaration under Section 564(b)(1) of the Act, 21 U.S.C.section 360bbb-3(b)(1), unless the authorization is terminated  or revoked sooner.       Influenza A by PCR NEGATIVE NEGATIVE Final   Influenza B by PCR NEGATIVE NEGATIVE Final    Comment: (NOTE) The Xpert Xpress SARS-CoV-2/FLU/RSV plus assay is intended as an aid in the diagnosis of influenza from Nasopharyngeal swab specimens and should not be used as a sole basis for treatment. Nasal washings and aspirates are unacceptable for Xpert Xpress SARS-CoV-2/FLU/RSV testing.  Fact Sheet for Patients: EntrepreneurPulse.com.au  Fact Sheet  for Healthcare Providers: IncredibleEmployment.be  This test is not yet approved or cleared by the [place] FDA and has been authorized for detection and/or diagnosis of SARS-CoV-2 by FDA under an Emergency Use Authorization (EUA). This EUA will remain in effect (meaning this test can be used) for the duration of the COVID-19 declaration under Section 564(b)(1) of the Act, 21 U.S.C. section 360bbb-3(b)(1), unless the authorization is terminated or revoked.  Performed at [facility], [address], [place], [place] [address]      Labs: Basic Metabolic Panel: Recent Labs  Lab 06/27/21 0506 06/28/21 0526 06/29/21 0520 06/30/21 0532 07/01/21 0639  NA 137 134* 137 137 136  K 3.3* 3.5 4.3 3.5 4.1  CL 103 104 105 103 108  CO2 _0 GLUCOSE 107* 102* 153* 154* 113*  BUN _1 28*  CREATININE 0.40* 0.47 0.58 0.48 0.44  CALCIUM 9.2 8.8* 9.1 9.5 8.7*  MG 2.0 2.0 2.1 2.2 2.0   Liver Function Tests: Recent Labs  Lab 06/25/21 2250 06/26/21 0427 06/30/21 1314  AST _2 ALT 21 20 33  ALKPHOS 184* 172* 173*  BILITOT 0.8 0.8 0.4  PROT 6.3* 5.7* 6.3*  ALBUMIN 3.8 3.4* 3.5   No results for input(s): LIPASE, AMYLASE in the last 168 hours. Recent Labs  Lab 06/25/21 2250 06/30/21 1314  AMMONIA 9 13   CBC:  Recent Labs  Lab 06/25/21 2250 06/26/21 0427 06/27/21 0506 06/28/21 0526 06/29/21 0520 06/30/21 0532 07/01/21 0639  WBC 8.8   < > 8.4 8.2 7.2 14.3* 12.8*  NEUTROABS 5.7  --   --   --   --   --   --   HGB 13.6   < > 14.1 13.1 13.2 14.4 11.8*  HCT 41.0   < > 43.5 39.9 40.3 43.6 36.0  MCV 93.2   < > 94.4 94.1 95.0 93.8 94.2  PLT 237   < > 238 246 234 310 289   < > = values in this interval not displayed.   Cardiac Enzymes: Recent Labs  Lab 06/30/21 1314  CKTOTAL 40   BNP: Invalid input(s): POCBNP CBG: No results for input(s): GLUCAP in the last 168 hours.  Time coordinating discharge:  36  minutes  Signed:  Orson Eva, DO [facility] Pager: [phone] 07/01/2021, 11:32 AM

## 2021-07-02 ENCOUNTER — Ambulatory Visit: Payer: Medicare Other | Admitting: Internal Medicine

## 2021-07-02 ENCOUNTER — Telehealth: Payer: Self-pay

## 2021-07-02 ENCOUNTER — Telehealth: Payer: Self-pay | Admitting: Family Medicine

## 2021-07-02 ENCOUNTER — Emergency Department (HOSPITAL_COMMUNITY)
Admission: EM | Admit: 2021-07-02 | Discharge: 2021-07-02 | Disposition: A | Discharge status: Home / Self Care | Payer: Medicare Other | Attending: Emergency Medicine | Admitting: Emergency Medicine

## 2021-07-02 ENCOUNTER — Other Ambulatory Visit: Payer: Self-pay

## 2021-07-02 ENCOUNTER — Encounter (HOSPITAL_COMMUNITY): Payer: Self-pay | Admitting: Emergency Medicine

## 2021-07-02 DIAGNOSIS — Z87891 Personal history of nicotine dependence: Secondary | ICD-10-CM | POA: Diagnosis not present

## 2021-07-02 DIAGNOSIS — Z85038 Personal history of other malignant neoplasm of large intestine: Secondary | ICD-10-CM | POA: Insufficient documentation

## 2021-07-02 DIAGNOSIS — M7989 Other specified soft tissue disorders: Secondary | ICD-10-CM

## 2021-07-02 DIAGNOSIS — Z7902 Long term (current) use of antithrombotics/antiplatelets: Secondary | ICD-10-CM | POA: Insufficient documentation

## 2021-07-02 DIAGNOSIS — I10 Essential (primary) hypertension: Secondary | ICD-10-CM | POA: Diagnosis not present

## 2021-07-02 DIAGNOSIS — Z7982 Long term (current) use of aspirin: Secondary | ICD-10-CM | POA: Insufficient documentation

## 2021-07-02 DIAGNOSIS — Z79899 Other long term (current) drug therapy: Secondary | ICD-10-CM | POA: Diagnosis not present

## 2021-07-02 DIAGNOSIS — R2232 Localized swelling, mass and lump, left upper limb: Secondary | ICD-10-CM | POA: Diagnosis present

## 2021-07-02 MED ORDER — HYDROCODONE-ACETAMINOPHEN 5-325 MG PO TABS: 0.5000 | ORAL_TABLET | Freq: Every day | ORAL | 0 refills | Status: DC | PRN

## 2021-07-02 NOTE — Telephone Encounter (Signed)
FYI- I spoke with [name] recommending stopping the Actemra at this time considering all the other events including new stroke and would not want to mess with blood pressure control even if it was not doing so. I recommend going back a step to the 20 mg prednisone dose for now since her DMARD is being stopped for the [facility]. We can f/u at the scheduled appointment 10/18 to reassess.

## 2021-07-02 NOTE — Telephone Encounter (Addendum)
Mckenzie [name] advised Mckenzie [name] should stop the Actemra at this time. The high dose prednisone recommended by neurology tapering down from a 60 mg daily dose after the IV treatment in the hospital would cover any active inflammation. Actemra can contribute to hypertension, rarely, and hyperlipidemia, commonly, although from studies was not associated with increased stroke risk but would just avoid this altogether for now. Mckenzie [name] had additional questions and call was transferred to Dr. Benjamine Mola to discuss.

## 2021-07-02 NOTE — Telephone Encounter (Signed)
Patient's daughter [name] left a voicemail cancelling her appointment this afternoon with Dr. Benjamine Mola.  [name] states her mom was released from the hospital yesterday and had to go back last night and released again this morning.  She states her dad is due to give her Actemra injection tomorrow, 07/03/21.  [name] states the hospital thinks the Actemra is raising her blood pressure and cholesterol and she suffered a stroke.  They are not sure if she should continue on the medication or go back to Prednisone.  [name] requested a return call [phone]

## 2021-07-02 NOTE — Telephone Encounter (Signed)
Mckenzie [name] called from [facility] stating that they received orders to resume patients care with [facility]. Pt was discharged on 9/25 from [facility]. Pt had a TIA.   Gave verbal OK to resume care.  Can contact Mckenzie at [phone]

## 2021-07-02 NOTE — Telephone Encounter (Signed)
Left VM on Mckenzie [name]'s number recommending she can call back otherwise we can reach out again later. She should stop the Actemra at this time. The high dose prednisone recommended by neurology tapering down from a 60 mg daily dose after the IV treatment in the hospital would cover any active inflammation. Actemra can contribute to hypertension, rarely, and hyperlipidemia, commonly, although from studies was not associated with increased stroke risk but would just avoid this altogether for now.

## 2021-07-02 NOTE — ED Triage Notes (Signed)
Pt discharged from hospital yesterday. Pt's husband states he helped pt to restroom during the night and he noticed her L arm was swollen where pt previously had an IV while in hospital. Husband concerned pt may have cellulitis.

## 2021-07-02 NOTE — Telephone Encounter (Signed)
Transition Care Management Unsuccessful Follow-up Telephone Call  Date of discharge and from where:  [facility] 07/01/21  Diagnosis:  CVA   Attempts:  1st Attempt  Reason for unsuccessful TCM follow-up call:  Left voice message

## 2021-07-02 NOTE — Telephone Encounter (Signed)
Transition Care Management Follow-up Telephone Call Date of discharge and from where: [name] 07/01/21  Diagnosis:  CVA How have you been since you were released from the hospital? Stable, still so tired and weak - had to go back to ER last night because her arm swelled but she wasn't there long Any questions or concerns? No  Items Reviewed: Did the pt receive and understand the discharge instructions provided? Yes  Medications obtained and verified? Yes  Other? No  Any new allergies since your discharge? No  Dietary orders reviewed? Yes Do you have support at home? Yes   Home Care and Equipment/Supplies: Were home health services ordered? yes If so, what is the name of the agency? [facility]  Has the agency set up a time to come to the patient's home? yes Were any new equipment or medical supplies ordered?  No What is the name of the medical supply agency? N/a Were you able to get the supplies/equipment? not applicable Do you have any questions related to the use of the equipment or supplies? No  Functional Questionnaire: (I = Independent and D = Dependent) ADLs: I - with assistance  Bathing/Dressing- I - with assistance  Meal Prep- D  Eating- I  Maintaining continence- D  Transferring/Ambulation- I - with walker or assistance -has a wheelchair also  Managing Meds- D  Follow up appointments reviewed:  PCP Hospital f/u appt confirmed? Yes  Scheduled to see Mckenzie [name] on 07/10/21 @ 10:05. [facility] f/u appt confirmed? Yes  Scheduled to see Urology on 9/30 @ 10 and Neurology 10/20 @ 10. Are transportation arrangements needed? No  If their condition worsens, is the pt aware to call PCP or go to the Emergency Dept.? Yes Was the patient provided with contact information for the PCP's office or ED? Yes Was to pt encouraged to call back with questions or concerns? Yes

## 2021-07-02 NOTE — Discharge Instructions (Addendum)
As discussed, I think that you likely have had an injury to the vessel in your arm and thus cause the bleeding and swelling and bruising to your left arm.  Please check the arm daily and and replace the Ace wrap as instructed here.  Please make sure is not too tight cause loss of circulation to her hand.  Please follow-up with your doctor for further evaluation.  Return here if it worsens.  As discussed, please hold your Plavix for the next 2 days.  It also appears they have elevated blood pressure.  This could be related to the bleeding to make sure you are taking a blood pressure medication because that will reduce the amount of bleeding that you do.  A prescription for Vicodin has been provided.  As I discussed with you this has significant side effects and interaction of medications especially in patients that already have issues with delirium.  Please only use it if absolutely necessary for her tailbone pain.

## 2021-07-02 NOTE — ED Provider Notes (Signed)
[facility] EMERGENCY DEPARTMENT Provider Note   CSN: [id number] Arrival date & time: 07/02/21  0049     History Chief Complaint  Patient presents with   L. arm swelling    Mckenzie [name] is a 75 y.o. female.  Patient was discharged in the hospital yesterday.  IV removed out of her left forearm.  Patient was helped to bed around 830 by her husband without any difficulties.  He was helping her this morning with something and noticed that her arm is significant swollen.  He is concerned that she may have cellulitis.  He states that she had a TIA and that she was started on Plavix while she was in the hospital. no Fevers.  No other associated symptoms   Arm Injury     Past Medical History:  Diagnosis Date   Depression    High cholesterol    Hypertension    Retinal micro-aneurysm of right eye     Patient Active Problem List   Diagnosis Date Noted   TIA (transient ischemic attack) 06/26/2021   Hypokalemia 06/26/2021   Temporal arteritis ([place]) 06/26/2021   Acute CVA (cerebrovascular accident) ([place]) 06/26/2021   Compression fracture of T1 vertebra ([place]) 05/25/2021   Compression fracture of T4 vertebra ([place]) 05/25/2021   Fracture of sacrum ([place]) 05/25/2021   Fracture of manubrium 05/25/2021   Low back pain 05/23/2021   Depression 01/23/2021   High risk medication use 01/02/2021   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[place]) 12/27/2020   Pericardial effusion 12/27/2020   Abnormal MRI, cervical spine 12/27/2020   Generalized weakness 12/27/2020   Steroid-induced hyperglycemia 12/27/2020   Malnutrition of moderate degree 12/11/2020   Pressure injury of skin 12/09/2020   Benign neoplasm of cecum    Benign neoplasm of descending colon    Hiatal hernia    Hyponatremia 12/08/2020   Thrombocytosis 12/08/2020   Hypoalbuminemia 12/08/2020   Memory loss 12/08/2020   Constipation 12/06/2020   Anemia of chronic disease 12/06/2020   Gait disturbance 11/14/2020   Lumbosacral radiculopathy at  S1 11/14/2020   Cognitive change 11/14/2020   Adjustment disorder with mixed anxiety and depressed mood 10/10/2020   Fracture 10/06/2020   Distal radius fracture 79/11/4095   Acute metabolic encephalopathy 35/32/9924   Proximal weakness of extremity 09/26/2020   Myalgia 09/26/2020   Fall at home, subsequent encounter 09/26/2020   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   Essential hypertension 01/11/2014   Vertigo 01/11/2014   Hyperlipidemia 01/11/2014    Past Surgical History:  Procedure Laterality Date   ARTERY BIOPSY Bilateral 12/15/2020   Procedure: BILATERAL TEMPORAL ARTERY BIOPSIES;  Surgeon: Angelia Mould, MD;  Location: [facility] OR;  Service: Vascular;  Laterality: Bilateral;   COLONOSCOPY WITH PROPOFOL N/A 12/09/2020   Procedure: COLONOSCOPY WITH PROPOFOL;  Surgeon: Gatha Mayer, MD;  Location: [facility] ENDOSCOPY;  Service: Endoscopy;  Laterality: N/A;   ESOPHAGOGASTRODUODENOSCOPY (EGD) WITH PROPOFOL N/A 12/09/2020   Procedure: ESOPHAGOGASTRODUODENOSCOPY (EGD) WITH PROPOFOL;  Surgeon: Gatha Mayer, MD;  Location: [place];  Service: Endoscopy;  Laterality: N/A;   EYE SURGERY     FINGER SURGERY Left    RING FINGER   IR THORACENTESIS ASP PLEURAL SPACE W/IMG GUIDE  12/13/2020   OPEN REDUCTION INTERNAL FIXATION (ORIF) DISTAL RADIAL FRACTURE Left 10/05/2020   Procedure: OPEN REDUCTION INTERNAL FIXATION (ORIF) DISTAL RADIAL FRACTURE;  Surgeon: Shona Needles, MD;  Location: [place];  Service: Orthopedics;  Laterality: Left;  supraclavicular block   POLYPECTOMY  12/09/2020   Procedure: POLYPECTOMY;  Surgeon:  Gatha Mayer, MD;  Location: [facility] ENDOSCOPY;  Service: Endoscopy;;   TUBAL LIGATION       OB History   No obstetric history on file.     Family History  Problem Relation Age of Onset   Hyperlipidemia Mother    Hypertension Mother    Diabetes Mother    COPD Father    Depression Sister    Suicidality Brother    Drug abuse Brother    Healthy Daughter    Healthy Daughter     Healthy Daughter    Colon cancer Neg Hx    Celiac disease Neg Hx    Inflammatory bowel disease Neg Hx     Social History   Tobacco Use   Smoking status: Former    Types: Cigarettes   Smokeless tobacco: Never   Tobacco comments:    for a short period  Media planner   Vaping Use: Never used  Substance Use Topics   Alcohol use: No   Drug use: No    Home Medications Prior to Admission medications   Medication Sig Start Date End Date Taking? Authorizing Provider  HYDROcodone-acetaminophen (NORCO/VICODIN) 5-325 MG tablet Take 0.5 tablets by mouth daily as needed for severe pain. 07/02/21  Yes Hannah Crill, Corene Cornea, MD  acetaminophen (TYLENOL) 325 MG tablet Take 2 tablets (650 mg total) by mouth every 6 (six) hours as needed for mild pain (or Fever >/= 101). 05/27/21   Jennye Boroughs, MD  amLODipine (NORVASC) 5 MG tablet Take 1 tablet (5 mg total) by mouth daily. 07/02/21   Orson Eva, MD  aspirin EC 81 MG EC tablet Take 1 tablet (81 mg total) by mouth daily. X one month, then stop 07/02/21   Tat, Shanon Brow, MD  atorvastatin (LIPITOR) 40 MG tablet Take 1 tablet (40 mg total) by mouth daily. 07/02/21   Orson Eva, MD  Bromfenac Sodium 0.07 % SOLN Apply 1 drop to eye daily. 1 drop right eye daily    [provider]  clopidogrel (PLAVIX) 75 MG tablet Take 1 tablet (75 mg total) by mouth daily with breakfast. 07/02/21   Tat, Shanon Brow, MD  COMBIGAN 0.2-0.5 % ophthalmic solution Place 1 drop into both eyes every 12 (twelve) hours. 12/01/13   [provider]  dorzolamide (TRUSOPT) 2 % ophthalmic solution Place 1 drop into both eyes 2 (two) times daily.    [provider]  Ergocalciferol (VITAMIN D2) 50 MCG (2000 UT) TABS Take 1 tablet by mouth daily. 04/30/21   Sater, Nanine Means, MD  escitalopram (LEXAPRO) 20 MG tablet Take 1 tablet (20 mg total) by mouth daily. 01/18/21   Loman Brooklyn, FNP  ferrous sulfate 325 (65 FE) MG tablet Take 325 mg by mouth every other day.    [provider]  fluorometholone (FML) 0.1 % ophthalmic suspension Place 1 drop into the right eye 4 (four) times daily.    [provider]  furosemide (LASIX) 20 MG tablet Take 10 mg by mouth daily.    [provider]  latanoprost (XALATAN) 0.005 % ophthalmic solution Place 1 drop into both eyes at bedtime. 12/29/15   [provider]  lisinopril (ZESTRIL) 10 MG tablet Take 1 tablet (10 mg total) by mouth daily. 06/22/21   Loman Brooklyn, FNP  MAGNESIUM PO Take 1 tablet by mouth daily.    [provider]  Multiple Vitamin (MULTIVITAMIN) tablet Take 1 tablet by mouth daily.    [provider]  Netarsudil Dimesylate (RHOPRESSA) 0.02 %  SOLN Place 1 drop into both eyes at bedtime.    [provider]  predniSONE (DELTASONE) 5 MG tablet Take 2 tablets (10 mg) daily Patient taking differently: 7.5 mg. Take 2 tablets (10 mg) daily 05/27/21   Jennye Boroughs, MD  sodium chloride 1 g tablet Take 1 g by mouth 2 (two) times daily with a meal.    [provider]  Tocilizumab (ACTEMRA ACTPEN) 162 MG/0.9ML SOAJ Inject 162 mg into the skin once a week. 06/04/21   Collier Salina, MD    Allergies    Aldomet [methyldopa], Fosamax [alendronate sodium], Acetazolamide, and Buspar [buspirone]  Review of Systems   Review of Systems  All other systems reviewed and are negative.  Physical Exam Updated Vital Signs BP (!) 171/71 (BP Location: Right Arm)   Pulse 81   Temp 98.7 F (37.1 C)   Resp 19   Ht 5\' 5"  (1.651 m)   Wt 59 kg   SpO2 99%   BMI 21.64 kg/m   Physical Exam Vitals and nursing note reviewed.  Constitutional:      Appearance: She is well-developed.  HENT:     Head: Normocephalic and atraumatic.     Mouth/Throat:     Mouth: Mucous membranes are moist.     Pharynx: Oropharynx is clear.  Eyes:     Pupils: Pupils are equal, round, and reactive to light.  Cardiovascular:     Rate and Rhythm: Normal rate and regular rhythm.  Pulmonary:      Effort: No respiratory distress.     Breath sounds: No stridor.  Abdominal:     General: There is no distension.  Musculoskeletal:        General: Normal range of motion.     Cervical back: Normal range of motion.     Comments: Left forearm with significant hematoma that is not significantly taught but slightly tight, pulse intact. Area of hematoma is cool to touch but had ice pack on it.   Skin:    General: Skin is warm and dry.  Neurological:     General: No focal deficit present.     Mental Status: She is alert.    ED Results / Procedures / Treatments   Labs (all labs ordered are listed, but only abnormal results are displayed) Labs Reviewed - No data to display  EKG None  Radiology CT HEAD WO CONTRAST (5MM)  Result Date: 07/01/2021 CLINICAL DATA:  Stroke suggested scratch set suspect stroke. Neurologic deficit. EXAM: CT HEAD WITHOUT CONTRAST TECHNIQUE: Contiguous axial images were obtained from the base of the skull through the vertex without intravenous contrast. COMPARISON:  Brain MRI 06/26/2021 FINDINGS: Brain: No evidence of acute infarction, hemorrhage, hydrocephalus, extra-axial collection or mass lesion/mass effect. Remote bilateral cerebellar hemisphere infarcts identified, left greater than right. There is mild diffuse low-attenuation within the subcortical and periventricular white matter compatible with chronic microvascular disease. Prominence of the sulci and ventricles compatible with brain atrophy. Vascular: No hyperdense vessel or unexpected calcification. Skull: Normal. Negative for fracture or focal lesion. Sinuses/Orbits: No acute finding. Other: None IMPRESSION: 1. No acute intracranial abnormalities. 2. 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 3. Remote bilateral cerebellar hemisphere infarcts. Electronically Signed   By: Kerby Moors M.D.   On: 07/01/2021 10:55    Procedures Procedures   Medications Ordered in ED Medications - No data to  display  ED Course  I have reviewed the triage vital signs and the nursing notes.  Pertinent labs &  imaging results that were available during my care of the patient were reviewed by me and considered in my medical decision making (see chart for details).    MDM Rules/Calculators/A&P                         Extenwsive hematoma to left forearm, likely related to the IV and plavix. No evidence or concern for other injury. No concern for compartment syndrome. Low suspicion for dvt. Ace wrap applied. Will reeval.   Reeval with improvement in the tightness of area, ecchymosis spreading proximally as expected.   Final Clinical Impression(s) / ED Diagnoses Final diagnoses:  Arm swelling  Hypertension, unspecified type    Rx / DC Orders ED Discharge Orders          Ordered    HYDROcodone-acetaminophen (NORCO/VICODIN) 5-325 MG tablet  Daily PRN        07/02/21 0307             Hailley Byers, Corene Cornea, MD 07/02/21 [phone]

## 2021-07-03 ENCOUNTER — Encounter: Payer: Self-pay | Admitting: Internal Medicine

## 2021-07-03 ENCOUNTER — Telehealth: Payer: Self-pay | Admitting: Family Medicine

## 2021-07-03 MED ORDER — PREDNISONE 10 MG PO TABS
20.0000 mg | ORAL_TABLET | Freq: Every day | ORAL | 0 refills | Status: DC
Start: 2021-07-03 — End: 2021-07-24

## 2021-07-03 NOTE — Telephone Encounter (Signed)
Next Visit: 07/24/2021  Last Visit: 05/23/2021  Current Dose per phone note on 07/02/2021: 20 mg prednisone dose   Okay to refill Prednisone?

## 2021-07-03 NOTE — Telephone Encounter (Signed)
Mckenzie [name] called to get verbal approval to continue OT 1x per week for 4 weeks.  Gave OK to continue OT as requested.  Can call Mckenzie [name] at [phone] if needed.

## 2021-07-04 NOTE — Telephone Encounter (Signed)
CLOSE

## 2021-07-05 ENCOUNTER — Ambulatory Visit (INDEPENDENT_AMBULATORY_CARE_PROVIDER_SITE_OTHER): Payer: Medicare Other | Admitting: Urology

## 2021-07-05 ENCOUNTER — Encounter: Payer: Self-pay | Admitting: Urology

## 2021-07-05 ENCOUNTER — Other Ambulatory Visit: Payer: Self-pay

## 2021-07-05 VITALS — BP 152/72 | HR 79 | Temp 98.5°F

## 2021-07-05 DIAGNOSIS — R351 Nocturia: Secondary | ICD-10-CM

## 2021-07-05 DIAGNOSIS — N3941 Urge incontinence: Secondary | ICD-10-CM | POA: Diagnosis not present

## 2021-07-05 DIAGNOSIS — R350 Frequency of micturition: Secondary | ICD-10-CM | POA: Diagnosis not present

## 2021-07-05 LAB — MICROSCOPIC EXAMINATION: Renal Epithel, UA: NONE SEEN /hpf

## 2021-07-05 LAB — URINALYSIS, ROUTINE W REFLEX MICROSCOPIC
Bilirubin, UA: NEGATIVE
Glucose, UA: NEGATIVE
Ketones, UA: NEGATIVE
Leukocytes,UA: NEGATIVE
Nitrite, UA: NEGATIVE
Protein,UA: NEGATIVE
Specific Gravity, UA: 1.015 (ref 1.005–1.030)
Urobilinogen, Ur: 0.2 mg/dL (ref 0.2–1.0)
pH, UA: 6.5 (ref 5.0–7.5)

## 2021-07-05 LAB — BLADDER SCAN AMB NON-IMAGING: Scan Result: 132

## 2021-07-05 NOTE — Progress Notes (Signed)
Assessment: 1. Urinary frequency   2. Nocturia   3. Urge incontinence      Plan: Diagnosis and management of overactive bladder and urge incontinence discussed with the patient and her husband today.  Options for management including avoidance of dietary irritants, behavioral modification, medical therapy, neuromodulation, and chemodenervation discussed.  Information provided to the patient. Prescription for PureWick external catheter was provided per patient request I provided the patient and her husband with a list of options for medical therapy to allow them to determine which would be covered by their insurance.  They will contact me with an answer. Return to office in approximately 6 weeks.  Chief Complaint:  Chief Complaint  Patient presents with   Urinary Frequency     History of Present Illness:  Mckenzie [name] is a 75 y.o. year old female who is seen in consultation from Ulice Bold, MD for evaluation of urinary frequency, nocturia, and urgency.  She has had symptoms for the past 8-10 months with gradual worsening.  She has daytime frequency voiding approximately every hour.  She also has significant nocturia voiding at least 10 times per night.  Her husband reports that she voids almost every 20 minutes.  She does have urgency and some urge incontinence.  She is using pads on a regular basis.  No dysuria or gross hematuria.  No recent UTIs.  She was given a prescription for Myrbetriq but was unable to afford this medication.  She is not currently on any medical therapy for her symptoms. She denies any problems with constipation or fecal incontinence.   Past Medical History:  Past Medical History:  Diagnosis Date   Depression    High cholesterol    Hypertension    Retinal micro-aneurysm of right eye     Past Surgical History:  Past Surgical History:  Procedure Laterality Date   ARTERY BIOPSY Bilateral 12/15/2020   Procedure: BILATERAL TEMPORAL ARTERY BIOPSIES;   Surgeon: Angelia Mould, MD;  Location: [facility] OR;  Service: Vascular;  Laterality: Bilateral;   COLONOSCOPY WITH PROPOFOL N/A 12/09/2020   Procedure: COLONOSCOPY WITH PROPOFOL;  Surgeon: Gatha Mayer, MD;  Location: [facility] ENDOSCOPY;  Service: Endoscopy;  Laterality: N/A;   ESOPHAGOGASTRODUODENOSCOPY (EGD) WITH PROPOFOL N/A 12/09/2020   Procedure: ESOPHAGOGASTRODUODENOSCOPY (EGD) WITH PROPOFOL;  Surgeon: Gatha Mayer, MD;  Location: [place];  Service: Endoscopy;  Laterality: N/A;   EYE SURGERY     FINGER SURGERY Left    RING FINGER   IR THORACENTESIS ASP PLEURAL SPACE W/IMG GUIDE  12/13/2020   OPEN REDUCTION INTERNAL FIXATION (ORIF) DISTAL RADIAL FRACTURE Left 10/05/2020   Procedure: OPEN REDUCTION INTERNAL FIXATION (ORIF) DISTAL RADIAL FRACTURE;  Surgeon: Shona Needles, MD;  Location: [place];  Service: Orthopedics;  Laterality: Left;  supraclavicular block   POLYPECTOMY  12/09/2020   Procedure: POLYPECTOMY;  Surgeon: Gatha Mayer, MD;  Location: [facility] ENDOSCOPY;  Service: Endoscopy;;   TUBAL LIGATION      Allergies:  Allergies  Allergen Reactions   Aldomet [Methyldopa] Other (See Comments)    flu   Fosamax [Alendronate Sodium]    Acetazolamide Rash   Buspar [Buspirone] Palpitations    Family History:  Family History  Problem Relation Age of Onset   Hyperlipidemia Mother    Hypertension Mother    Diabetes Mother    COPD Father    Depression Sister    Suicidality Brother    Drug abuse Brother    Healthy Daughter    Healthy Daughter  Healthy Daughter    Colon cancer Neg Hx    Celiac disease Neg Hx    Inflammatory bowel disease Neg Hx     Social History:  Social History   Tobacco Use   Smoking status: Former    Types: Cigarettes   Smokeless tobacco: Never   Tobacco comments:    for a short period  Media planner   Vaping Use: Never used  Substance Use Topics   Alcohol use: No   Drug use: No    Review of symptoms:  Constitutional:  Negative for unexplained  weight loss, night sweats, fever, chills ENT:  Negative for nose bleeds, sinus pain, painful swallowing CV:  Negative for chest pain, shortness of breath, exercise intolerance, palpitations, loss of consciousness Resp:  Negative for cough, wheezing, shortness of breath GI:  Negative for nausea, vomiting, diarrhea, bloody stools GU:  Positives noted in HPI; otherwise negative for gross hematuria, dysuria Neuro:  Negative for seizures, poor balance, limb weakness, slurred speech Psych:  Negative for lack of energy, depression, anxiety Endocrine:  Negative for polydipsia, polyuria, symptoms of hypoglycemia (dizziness, hunger, sweating) Hematologic:  Negative for anemia, purpura, petechia, prolonged or excessive bleeding, use of anticoagulants  Allergic:  Negative for difficulty breathing or choking as a result of exposure to anything; no shellfish allergy; no allergic response (rash/itch) to materials, foods  Physical exam: BP (!) 152/72 (BP Location: Left Arm)   Pulse 79   Temp 98.5 F (36.9 C)  GENERAL APPEARANCE:  Well appearing, well developed, well nourished, NAD HEENT: Atraumatic, Normocephalic, oropharynx clear. NECK: Supple without lymphadenopathy or thyromegaly. LUNGS: Clear to auscultation bilaterally. HEART: Regular Rate and Rhythm without murmurs, gallops, or rubs. ABDOMEN: Soft, non-tender, No Masses. EXTREMITIES: Moves all extremities well.  Without clubbing, cyanosis, or edema. Diffuse ecchymosis of upper extremities L>R NEUROLOGIC:  Alert and oriented x 3, in wheelchair, CN II-XII grossly intact.  MENTAL STATUS:  Appropriate. BACK:  Non-tender to palpation.  No CVAT SKIN:  Warm, dry and intact.    Results: Results for orders placed or performed in visit on 07/05/21 (from the past 24 hour(s))  Urinalysis, Routine w reflex microscopic     Status: Abnormal   Collection Time: 07/05/21  2:15 PM  Result Value Ref Range   Specific Gravity, UA 1.015 1.005 - 1.030   pH, UA 6.5  5.0 - 7.5   Color, UA Yellow Yellow   Appearance Ur Clear Clear   Leukocytes,UA Negative Negative   Protein,UA Negative Negative/Trace   Glucose, UA Negative Negative   Ketones, UA Negative Negative   RBC, UA Trace (A) Negative   Bilirubin, UA Negative Negative   Urobilinogen, Ur 0.2 0.2 - 1.0 mg/dL   Nitrite, UA Negative Negative   Microscopic Examination See below:    Narrative   Performed at:  [facility] [address] [place], [place]  [id number] Lab Director: Mina Marble [place], Phone:  [phone]  Microscopic Examination     Status: Abnormal   Collection Time: 07/05/21  2:15 PM   Urine  Result Value Ref Range   WBC, UA 0-5 0 - 5 /hpf   RBC 0-2 0 - 2 /hpf   Epithelial Cells (non renal) 0-10 0 - 10 /hpf   Renal Epithel, UA None seen None seen /hpf   Bacteria, UA Few (A) None seen/Few   Narrative   Performed at:  [facility] [address], [place], [place]  [id number] Lab Director: [place], Phone:  [phone]     PVR:  132 ml

## 2021-07-05 NOTE — Progress Notes (Signed)
Urological Symptom Review  Patient is experiencing the following symptoms: Frequent urination Hard to postpone urination Blood in urine Urinary tract infection   Review of Systems  Gastrointestinal (upper)  : Negative for upper GI symptoms  Gastrointestinal (lower) : Constipation  Constitutional : Fatigue  Skin: Negative for skin symptoms  Eyes: Blurred vision Double vision  Ear/Nose/Throat : Negative for Ear/Nose/Throat symptoms  Hematologic/Lymphatic: Easy bruising  Cardiovascular : Negative for cardiovascular symptoms  Respiratory : Negative for respiratory symptoms  Endocrine: Negative for endocrine symptoms  Musculoskeletal: Back pain Joint pain  Neurological: Dizziness  Psychologic: Depression Anxiety

## 2021-07-05 NOTE — Progress Notes (Signed)
PVR 132

## 2021-07-05 NOTE — Patient Instructions (Signed)
Solifenacin (Vesicare) Fesoterodine (Toviaz) Trospium Tolterodine Darifenacin Mckenzie [name]

## 2021-07-06 ENCOUNTER — Encounter: Payer: Self-pay | Admitting: Family Medicine

## 2021-07-06 ENCOUNTER — Ambulatory Visit: Payer: Medicare Other | Admitting: Urology

## 2021-07-09 ENCOUNTER — Telehealth: Payer: Self-pay

## 2021-07-09 ENCOUNTER — Other Ambulatory Visit: Payer: Self-pay | Admitting: Urology

## 2021-07-09 DIAGNOSIS — N3941 Urge incontinence: Secondary | ICD-10-CM

## 2021-07-09 MED ORDER — SOLIFENACIN SUCCINATE 10 MG PO TABS: 10.0000 mg | ORAL_TABLET | Freq: Every day | ORAL | 11 refills | Status: DC

## 2021-07-09 NOTE — Telephone Encounter (Signed)
Left detailed message on patient machine on medication and how MD ordered to take.

## 2021-07-09 NOTE — Telephone Encounter (Signed)
Patient called wanting to advise Dr. Alyson Ingles of which medication insurance would cover. Per insurance they advised Solifenacin (vesicara)  Generic Rx.  Pharmacy: [facility] [address] [place] [address]

## 2021-07-09 NOTE — Telephone Encounter (Signed)
Please see patient note of approved medication. I can send if you would like.

## 2021-07-10 ENCOUNTER — Ambulatory Visit (INDEPENDENT_AMBULATORY_CARE_PROVIDER_SITE_OTHER): Payer: Medicare Other | Admitting: Family Medicine

## 2021-07-10 ENCOUNTER — Ambulatory Visit (INDEPENDENT_AMBULATORY_CARE_PROVIDER_SITE_OTHER): Payer: Medicare Other

## 2021-07-10 ENCOUNTER — Other Ambulatory Visit: Payer: Self-pay

## 2021-07-10 ENCOUNTER — Encounter: Payer: Self-pay | Admitting: Family Medicine

## 2021-07-10 VITALS — BP 131/65 | HR 73 | Temp 96.7°F | Ht 65.0 in | Wt 126.4 lb

## 2021-07-10 DIAGNOSIS — Z8673 Personal history of transient ischemic attack (TIA), and cerebral infarction without residual deficits: Secondary | ICD-10-CM | POA: Diagnosis not present

## 2021-07-10 DIAGNOSIS — Z7952 Long term (current) use of systemic steroids: Secondary | ICD-10-CM

## 2021-07-10 DIAGNOSIS — M545 Low back pain, unspecified: Secondary | ICD-10-CM

## 2021-07-10 DIAGNOSIS — R233 Spontaneous ecchymoses: Secondary | ICD-10-CM | POA: Diagnosis not present

## 2021-07-10 DIAGNOSIS — T148XXA Other injury of unspecified body region, initial encounter: Secondary | ICD-10-CM

## 2021-07-10 DIAGNOSIS — Z09 Encounter for follow-up examination after completed treatment for conditions other than malignant neoplasm: Secondary | ICD-10-CM | POA: Diagnosis not present

## 2021-07-10 DIAGNOSIS — I10 Essential (primary) hypertension: Secondary | ICD-10-CM

## 2021-07-10 LAB — CBC WITH DIFFERENTIAL/PLATELET
Basophils Absolute: 0 10*3/uL (ref 0.0–0.2)
Basos: 0 %
EOS (ABSOLUTE): 0.1 10*3/uL (ref 0.0–0.4)
Eos: 1 %
Hematocrit: 37.3 % (ref 34.0–46.6)
Hemoglobin: 12 g/dL (ref 11.1–15.9)
Immature Grans (Abs): 0.2 10*3/uL — ABNORMAL HIGH (ref 0.0–0.1)
Immature Granulocytes: 1 %
Lymphocytes Absolute: 1.3 10*3/uL (ref 0.7–3.1)
Lymphs: 6 %
MCH: 29.6 pg (ref 26.6–33.0)
MCHC: 32.2 g/dL (ref 31.5–35.7)
MCV: 92 fL (ref 79–97)
Monocytes Absolute: 0.8 10*3/uL (ref 0.1–0.9)
Monocytes: 4 %
Neutrophils Absolute: 18.2 10*3/uL — ABNORMAL HIGH (ref 1.4–7.0)
Neutrophils: 88 %
Platelets: 593 10*3/uL — ABNORMAL HIGH (ref 150–450)
RBC: 4.06 x10E6/uL (ref 3.77–5.28)
RDW: 12.4 % (ref 11.7–15.4)
WBC: 20.7 10*3/uL (ref 3.4–10.8)

## 2021-07-10 LAB — BMP8+EGFR
BUN/Creatinine Ratio: 22 (ref 12–28)
BUN: 12 mg/dL (ref 8–27)
CO2: 24 mmol/L (ref 20–29)
Calcium: 9.9 mg/dL (ref 8.7–10.3)
Chloride: 99 mmol/L (ref 96–106)
Creatinine, Ser: 0.54 mg/dL — ABNORMAL LOW (ref 0.57–1.00)
Glucose: 110 mg/dL — ABNORMAL HIGH (ref 70–99)
Potassium: 4.3 mmol/L (ref 3.5–5.2)
Sodium: 137 mmol/L (ref 134–144)
eGFR: 95 mL/min/{1.73_m2} (ref >59)

## 2021-07-10 MED ORDER — AMLODIPINE BESYLATE 5 MG PO TABS: 5.0000 mg | ORAL_TABLET | Freq: Every day | ORAL | 1 refills | Status: DC

## 2021-07-10 NOTE — Progress Notes (Signed)
Assessment & Plan:  1. Hospital discharge follow-up  2. Recent cerebrovascular accident (CVA) Not agreeable in restarting Plavix. Encouraged to call neurology office and make them aware that upcoming appointment needed to be for a follow-up CVA as well. Continue ASA and PT. - BMP8+EGFR - CBC with Differential/Platelet  3. Essential hypertension Well controlled on current regimen.  - amLODipine (NORVASC) 5 MG tablet; Take 1 tablet (5 mg total) by mouth daily.  Dispense: 90 tablet; Refill: 1 - BMP8+EGFR - CBC with Differential/Platelet  4. Bruising Arnicare gel. Education provided on contusions.   5. Acute midline low back pain without sciatica - DG Lumbar Spine 2-3 Views  6. Current chronic use of systemic steroids - DG Lumbar Spine 2-3 Views   Return in about 6 weeks (around 08/21/2021) for follow-up of chronic medication conditions.  Hendricks Limes, MSN, APRN, FNP-C [facility]  Subjective:    [name], female    DOB: 1946/08/04, 75 y.o.   MRN: [id number]  Patient Care Team: Loman Brooklyn, FNP as PCP - General (Family Medicine) Eloise Harman, DO as Consulting Physician (Internal Medicine) Wyatt Portela, MD as Consulting Physician (Oncology)   Chief Complaint:  Chief Complaint  Patient presents with   Transitions Of Care    D/C [facility] 07/01/21 CVA    HPI: Mckenzie [name] is a 75 y.o. female presenting on 07/10/2021 for Transitions Of Care (D/C [facility] 07/01/21 CVA)  Patient is accompanied by her husband, who she is okay with being present.   Patient was admitted to [facility] 06/25/2021-07/01/2021 due to an acute CVA. Patient was started on Plavix and continued on aspirin. Echo EF 60-65%. EEG negative. LP negative. UA negative. It was recommended she have neuropsychiatric testing as an outpatient, but she and her husband do not wish to pursue this. She was discharged home with home health PT; they have been out  to this house already. After her hospitalization rheumatology stopped Actemra due to the risk of medication elevating her blood pressure and cholesterol levels in the setting of her CVA.   Patient was seen back in the ER on 07/02/2021 due to a large hematoma at the site of IV insertion. She has bruising from the middle of her upper arm down to her finger tips. She reports the swelling is much improved. She is no longer taking Plavix since this event and has no intention of restarting.   Patient has an upcoming appointment with neurology, Dr. Felecia Shelling, on 07/26/2021 for routine follow-up.   New complaints: Patient's biggest concern is her low back pain. She reports 4-5 weeks ago she was bending over when she heard a pop and immediately felt horrible pain. Pain is worsened by lying flat and prolonged sitting/standing. Aleve and Tylenol arthritis help a little bit, but 1/2 tablet of Norco helps the most (this was given by the ER). She is getting up 10-18x/night to urinate. Denies incontinence, numbness, radiation of pain.    Social history:  Relevant past medical, surgical, family and social history reviewed and updated as indicated. Interim medical history since our last visit reviewed.  Allergies and medications reviewed and updated.  DATA REVIEWED: CHART IN EPIC  ROS: Negative unless specifically indicated above in HPI.    Current Outpatient Medications:    acetaminophen (TYLENOL) 325 MG tablet, Take 2 tablets (650 mg total) by mouth every 6 (six) hours as needed for mild pain (or Fever >/= 101)., Disp: , Rfl:  amLODipine (NORVASC) 5 MG tablet, Take 1 tablet (5 mg total) by mouth daily., Disp: 30 tablet, Rfl: 1   aspirin EC 81 MG EC tablet, Take 1 tablet (81 mg total) by mouth daily. X one month, then stop, Disp: 30 tablet, Rfl: 0   atorvastatin (LIPITOR) 40 MG tablet, Take 1 tablet (40 mg total) by mouth daily., Disp: 30 tablet, Rfl: 1   Bromfenac Sodium 0.07 % SOLN, Apply 1 drop to eye  daily. 1 drop right eye daily, Disp: , Rfl:    COMBIGAN 0.2-0.5 % ophthalmic solution, Place 1 drop into both eyes every 12 (twelve) hours., Disp: , Rfl:    dorzolamide (TRUSOPT) 2 % ophthalmic solution, Place 1 drop into both eyes 2 (two) times daily., Disp: , Rfl:    Ergocalciferol (VITAMIN D2) 50 MCG (2000 UT) TABS, Take 1 tablet by mouth daily., Disp: 30 tablet, Rfl: 6   escitalopram (LEXAPRO) 20 MG tablet, Take 1 tablet (20 mg total) by mouth daily., Disp: 90 tablet, Rfl: 1   ferrous sulfate 325 (65 FE) MG tablet, Take 325 mg by mouth every other day., Disp: , Rfl:    fluorometholone (FML) 0.1 % ophthalmic suspension, Place 1 drop into the right eye 4 (four) times daily., Disp: , Rfl:    furosemide (LASIX) 20 MG tablet, Take 10 mg by mouth daily., Disp: , Rfl:    HYDROcodone-acetaminophen (NORCO/VICODIN) 5-325 MG tablet, Take 0.5 tablets by mouth daily as needed for severe pain., Disp: 10 tablet, Rfl: 0   latanoprost (XALATAN) 0.005 % ophthalmic solution, Place 1 drop into both eyes at bedtime., Disp: , Rfl:    lisinopril (ZESTRIL) 10 MG tablet, Take 1 tablet (10 mg total) by mouth daily., Disp: 30 tablet, Rfl: 2   MAGNESIUM PO, Take 1 tablet by mouth daily., Disp: , Rfl:    Multiple Vitamin (MULTIVITAMIN) tablet, Take 1 tablet by mouth daily., Disp: , Rfl:    Netarsudil Dimesylate (RHOPRESSA) 0.02 % SOLN, Place 1 drop into both eyes at bedtime., Disp: , Rfl:    predniSONE (DELTASONE) 10 MG tablet, Take 2 tablets (20 mg total) by mouth daily with breakfast., Disp: 60 tablet, Rfl: 0   sodium chloride 1 g tablet, Take 1 g by mouth 2 (two) times daily with a meal., Disp: , Rfl:    solifenacin (VESICARE) 10 MG tablet, Take 1 tablet (10 mg total) by mouth daily., Disp: 30 tablet, Rfl: 11   Tocilizumab (ACTEMRA ACTPEN) 162 MG/0.9ML SOAJ, Inject 162 mg into the skin once a week., Disp: 10.8 mL, Rfl: 0   clopidogrel (PLAVIX) 75 MG tablet, Take 1 tablet (75 mg total) by mouth daily with breakfast.  (Patient not taking: Reported on 07/10/2021), Disp: 30 tablet, Rfl: 2   Allergies  Allergen Reactions   Aldomet [Methyldopa] Other (See Comments)    flu   Fosamax [Alendronate Sodium]    Acetazolamide Rash   Buspar [Buspirone] Palpitations   Past Medical History:  Diagnosis Date   Depression    High cholesterol    Hypertension    Retinal micro-aneurysm of right eye     Past Surgical History:  Procedure Laterality Date   ARTERY BIOPSY Bilateral 12/15/2020   Procedure: BILATERAL TEMPORAL ARTERY BIOPSIES;  Surgeon: Angelia Mould, MD;  Location: [facility];  Service: Vascular;  Laterality: Bilateral;   COLONOSCOPY WITH PROPOFOL N/A 12/09/2020   Procedure: COLONOSCOPY WITH PROPOFOL;  Surgeon: Gatha Mayer, MD;  Location: [facility] ENDOSCOPY;  Service: Endoscopy;  Laterality: N/A;  ESOPHAGOGASTRODUODENOSCOPY (EGD) WITH PROPOFOL N/A 12/09/2020   Procedure: ESOPHAGOGASTRODUODENOSCOPY (EGD) WITH PROPOFOL;  Surgeon: Gatha Mayer, MD;  Location: [facility];  Service: Endoscopy;  Laterality: N/A;   EYE SURGERY     FINGER SURGERY Left    RING FINGER   IR THORACENTESIS ASP PLEURAL SPACE W/IMG GUIDE  12/13/2020   OPEN REDUCTION INTERNAL FIXATION (ORIF) DISTAL RADIAL FRACTURE Left 10/05/2020   Procedure: OPEN REDUCTION INTERNAL FIXATION (ORIF) DISTAL RADIAL FRACTURE;  Surgeon: Shona Needles, MD;  Location: [facility];  Service: Orthopedics;  Laterality: Left;  supraclavicular block   POLYPECTOMY  12/09/2020   Procedure: POLYPECTOMY;  Surgeon: Gatha Mayer, MD;  Location: [facility] ENDOSCOPY;  Service: Endoscopy;;   TUBAL LIGATION      Social History   Socioeconomic History   Marital status: Married    Spouse name: Not on file   Number of children: 3   Years of education: 12   Highest education level: Not on file  Occupational History   Not on file  Tobacco Use   Smoking status: Former    Types: Cigarettes   Smokeless tobacco: Never   Tobacco comments:    for a short period  Vaping Use    Vaping Use: Never used  Substance and Sexual Activity   Alcohol use: No   Drug use: No   Sexual activity: Not on file  Other Topics Concern   Not on file  Social History Narrative   Right handed   Caffeine use: coffee daily   Social Determinants of Health   Financial Resource Strain: Not on file  Food Insecurity: Not on file  Transportation Needs: Not on file  Physical Activity: Not on file  Stress: Not on file  Social Connections: Not on file  Intimate Partner Violence: Not on file        Objective:    BP 131/65   Pulse 73   Temp (!) 96.7 F (35.9 C) (Temporal)   Ht 5' 5" (1.651 m)   Wt 126 lb 6.4 oz (57.3 kg)   SpO2 99%   BMI 21.03 kg/m   Wt Readings from Last 3 Encounters:  07/10/21 126 lb 6.4 oz (57.3 kg)  07/02/21 130 lb 1.1 oz (59 kg)  06/25/21 130 lb 1.1 oz (59 kg)    Physical Exam Vitals reviewed.  Constitutional:      General: She is not in acute distress.    Appearance: Normal appearance. She is normal weight. She is not ill-appearing, toxic-appearing or diaphoretic.  HENT:     Head: Normocephalic and atraumatic.  Eyes:     General: No scleral icterus.       Right eye: No discharge.        Left eye: No discharge.     Conjunctiva/sclera: Conjunctivae normal.  Cardiovascular:     Rate and Rhythm: Normal rate and regular rhythm.     Heart sounds: Normal heart sounds. No murmur heard.   No friction rub. No gallop.  Pulmonary:     Effort: Pulmonary effort is normal. No respiratory distress.     Breath sounds: Normal breath sounds. No stridor. No wheezing, rhonchi or rales.  Musculoskeletal:     Cervical back: Normal range of motion.     Lumbar back: Bony tenderness present. Positive right straight leg raise test and positive left straight leg raise test.  Skin:    General: Skin is warm and dry.     Capillary Refill: Capillary refill takes less than 2 seconds.  Neurological:     General: No focal deficit present.     Mental Status: She is alert  and oriented to person, place, and time. Mental status is at baseline.  Psychiatric:        Mood and Affect: Mood normal.        Behavior: Behavior normal.        Thought Content: Thought content normal.        Judgment: Judgment normal.    Lab Results  Component Value Date   TSH 1.907 06/25/2021   Lab Results  Component Value Date   WBC 12.8 (H) 07/01/2021   HGB 11.8 (L) 07/01/2021   HCT 36.0 07/01/2021   MCV 94.2 07/01/2021   PLT 289 07/01/2021   Lab Results  Component Value Date   NA 136 07/01/2021   K 4.1 07/01/2021   CO2 24 07/01/2021   GLUCOSE 113 (H) 07/01/2021   BUN 28 (H) 07/01/2021   CREATININE 0.44 07/01/2021   BILITOT 0.4 06/30/2021   ALKPHOS 173 (H) 06/30/2021   AST 22 06/30/2021   ALT 33 06/30/2021   PROT 6.3 (L) 06/30/2021   ALBUMIN 3.5 06/30/2021   CALCIUM 8.7 (L) 07/01/2021   ANIONGAP 4 (L) 07/01/2021   EGFR 91 04/25/2021   Lab Results  Component Value Date   CHOL 267 (H) 06/26/2021   Lab Results  Component Value Date   HDL 49 06/26/2021   Lab Results  Component Value Date   LDLCALC 188 (H) 06/26/2021   Lab Results  Component Value Date   TRIG 152 (H) 06/26/2021   Lab Results  Component Value Date   CHOLHDL 5.4 06/26/2021   Lab Results  Component Value Date   HGBA1C 5.2 06/26/2021   HGBA1C 5.2 06/26/2021

## 2021-07-10 NOTE — Patient Instructions (Addendum)
Arnicare Gel for bruising.  Call neurologist and let them know you had a recent CVA and you are not taking Plavix so they know they need to address this at the upcoming appointment.

## 2021-07-11 ENCOUNTER — Ambulatory Visit (INDEPENDENT_AMBULATORY_CARE_PROVIDER_SITE_OTHER): Payer: Medicare Other

## 2021-07-11 ENCOUNTER — Other Ambulatory Visit: Payer: Self-pay | Admitting: Family Medicine

## 2021-07-11 ENCOUNTER — Encounter: Payer: Self-pay | Admitting: Internal Medicine

## 2021-07-11 DIAGNOSIS — S2221XD Fracture of manubrium, subsequent encounter for fracture with routine healing: Secondary | ICD-10-CM | POA: Diagnosis not present

## 2021-07-11 DIAGNOSIS — S22010D Wedge compression fracture of first thoracic vertebra, subsequent encounter for fracture with routine healing: Secondary | ICD-10-CM

## 2021-07-11 DIAGNOSIS — I10 Essential (primary) hypertension: Secondary | ICD-10-CM

## 2021-07-11 DIAGNOSIS — S22040D Wedge compression fracture of fourth thoracic vertebra, subsequent encounter for fracture with routine healing: Secondary | ICD-10-CM

## 2021-07-11 DIAGNOSIS — M4804 Spinal stenosis, thoracic region: Secondary | ICD-10-CM

## 2021-07-11 DIAGNOSIS — F32A Depression, unspecified: Secondary | ICD-10-CM

## 2021-07-11 DIAGNOSIS — M47816 Spondylosis without myelopathy or radiculopathy, lumbar region: Secondary | ICD-10-CM

## 2021-07-11 DIAGNOSIS — E7800 Pure hypercholesterolemia, unspecified: Secondary | ICD-10-CM

## 2021-07-11 DIAGNOSIS — S22080A Wedge compression fracture of T11-T12 vertebra, initial encounter for closed fracture: Secondary | ICD-10-CM

## 2021-07-11 DIAGNOSIS — S3210XD Unspecified fracture of sacrum, subsequent encounter for fracture with routine healing: Secondary | ICD-10-CM

## 2021-07-12 ENCOUNTER — Encounter: Payer: Self-pay | Admitting: Family Medicine

## 2021-07-13 ENCOUNTER — Telehealth: Payer: Self-pay | Admitting: *Deleted

## 2021-07-13 MED ORDER — HYDROCODONE-ACETAMINOPHEN 5-325 MG PO TABS: 1.0000 | ORAL_TABLET | Freq: Four times a day (QID) | ORAL | 0 refills | Status: DC | PRN

## 2021-07-13 NOTE — Telephone Encounter (Signed)
HH aware and verbalizes understanding.

## 2021-07-13 NOTE — Telephone Encounter (Signed)
VM from [place] w/ [facility] P[facility] was out seeing pt yesterday, someone told her she had a new fracture. Should they put PT on hold, please advise

## 2021-07-13 NOTE — Telephone Encounter (Signed)
Yes, lets hold PT until she is seen by neurosurgeon and they develop treatment plan.

## 2021-07-16 ENCOUNTER — Encounter: Payer: Self-pay | Admitting: Family Medicine

## 2021-07-19 ENCOUNTER — Encounter (INDEPENDENT_AMBULATORY_CARE_PROVIDER_SITE_OTHER): Payer: Medicare Other | Admitting: Ophthalmology

## 2021-07-20 ENCOUNTER — Emergency Department (HOSPITAL_COMMUNITY): Payer: Medicare Other

## 2021-07-20 ENCOUNTER — Other Ambulatory Visit: Payer: Self-pay

## 2021-07-20 ENCOUNTER — Encounter (HOSPITAL_COMMUNITY): Payer: Self-pay

## 2021-07-20 ENCOUNTER — Emergency Department (HOSPITAL_COMMUNITY)
Admission: EM | Admit: 2021-07-20 | Discharge: 2021-07-20 | Disposition: A | Discharge status: Home / Self Care | Payer: Medicare Other | Attending: Emergency Medicine | Admitting: Emergency Medicine

## 2021-07-20 DIAGNOSIS — Z20822 Contact with and (suspected) exposure to covid-19: Secondary | ICD-10-CM | POA: Insufficient documentation

## 2021-07-20 DIAGNOSIS — Z7902 Long term (current) use of antithrombotics/antiplatelets: Secondary | ICD-10-CM | POA: Diagnosis not present

## 2021-07-20 DIAGNOSIS — R410 Disorientation, unspecified: Secondary | ICD-10-CM | POA: Diagnosis not present

## 2021-07-20 DIAGNOSIS — I10 Essential (primary) hypertension: Secondary | ICD-10-CM | POA: Diagnosis not present

## 2021-07-20 DIAGNOSIS — R4182 Altered mental status, unspecified: Secondary | ICD-10-CM

## 2021-07-20 DIAGNOSIS — G459 Transient cerebral ischemic attack, unspecified: Secondary | ICD-10-CM | POA: Insufficient documentation

## 2021-07-20 DIAGNOSIS — Z79899 Other long term (current) drug therapy: Secondary | ICD-10-CM | POA: Diagnosis not present

## 2021-07-20 DIAGNOSIS — Z7982 Long term (current) use of aspirin: Secondary | ICD-10-CM | POA: Diagnosis not present

## 2021-07-20 DIAGNOSIS — Z87891 Personal history of nicotine dependence: Secondary | ICD-10-CM | POA: Insufficient documentation

## 2021-07-20 LAB — COMPREHENSIVE METABOLIC PANEL
ALT: 20 U/L (ref 0–44)
AST: 19 U/L (ref 15–41)
Albumin: 3.6 g/dL (ref 3.5–5.0)
Alkaline Phosphatase: 138 U/L — ABNORMAL HIGH (ref 38–126)
Anion gap: 7 (ref 5–15)
BUN: 21 mg/dL (ref 8–23)
CO2: 29 mmol/L (ref 22–32)
Calcium: 9.1 mg/dL (ref 8.9–10.3)
Chloride: 99 mmol/L (ref 98–111)
Creatinine, Ser: 0.87 mg/dL (ref 0.44–1.00)
GFR, Estimated: >60 mL/min (ref >60)
Glucose, Bld: 140 mg/dL — ABNORMAL HIGH (ref 70–99)
Potassium: 4.3 mmol/L (ref 3.5–5.1)
Sodium: 135 mmol/L (ref 135–145)
Total Bilirubin: 0.4 mg/dL (ref 0.3–1.2)
Total Protein: 6.6 g/dL (ref 6.5–8.1)

## 2021-07-20 LAB — CBC WITH DIFFERENTIAL/PLATELET
Abs Immature Granulocytes: 0.08 10*3/uL — ABNORMAL HIGH (ref 0.00–0.07)
Basophils Absolute: 0 10*3/uL (ref 0.0–0.1)
Basophils Relative: 0 %
Eosinophils Absolute: 0 10*3/uL (ref 0.0–0.5)
Eosinophils Relative: 0 %
HCT: 37 % (ref 36.0–46.0)
Hemoglobin: 11.9 g/dL — ABNORMAL LOW (ref 12.0–15.0)
Immature Granulocytes: 1 %
Lymphocytes Relative: 9 %
Lymphs Abs: 1.2 10*3/uL (ref 0.7–4.0)
MCH: 30.4 pg (ref 26.0–34.0)
MCHC: 32.2 g/dL (ref 30.0–36.0)
MCV: 94.4 fL (ref 80.0–100.0)
Monocytes Absolute: 0.6 10*3/uL (ref 0.1–1.0)
Monocytes Relative: 5 %
Neutro Abs: 10.8 10*3/uL — ABNORMAL HIGH (ref 1.7–7.7)
Neutrophils Relative %: 85 %
Platelets: 456 10*3/uL — ABNORMAL HIGH (ref 150–400)
RBC: 3.92 MIL/uL (ref 3.87–5.11)
RDW: 13.6 % (ref 11.5–15.5)
WBC: 12.7 10*3/uL — ABNORMAL HIGH (ref 4.0–10.5)
nRBC: 0 % (ref 0.0–0.2)

## 2021-07-20 LAB — RESP PANEL BY RT-PCR (FLU A&B, COVID) ARPGX2
Influenza A by PCR: NEGATIVE
Influenza B by PCR: NEGATIVE
SARS Coronavirus 2 by RT PCR: NEGATIVE

## 2021-07-20 LAB — PROTIME-INR
INR: 1 (ref 0.8–1.2)
Prothrombin Time: 13.2 seconds (ref 11.4–15.2)

## 2021-07-20 LAB — URINALYSIS, ROUTINE W REFLEX MICROSCOPIC
Bilirubin Urine: NEGATIVE
Glucose, UA: 150 mg/dL — AB
Hgb urine dipstick: NEGATIVE
Ketones, ur: NEGATIVE mg/dL
Leukocytes,Ua: NEGATIVE
Nitrite: NEGATIVE
Protein, ur: NEGATIVE mg/dL
Specific Gravity, Urine: 1.013 (ref 1.005–1.030)
pH: 7 (ref 5.0–8.0)

## 2021-07-20 LAB — LACTIC ACID, PLASMA: Lactic Acid, Venous: 1.5 mmol/L (ref 0.5–1.9)

## 2021-07-20 MED ORDER — SODIUM CHLORIDE 0.9 % IV BOLUS
1000.0000 mL | Freq: Once | INTRAVENOUS | Status: AC
  Administered 2021-07-20: 1000 mL via INTRAVENOUS

## 2021-07-20 NOTE — ED Triage Notes (Addendum)
POV from home with daughter cc  of confusion since yesterday at lunch time. Took a home  UTI test and it was positive. Called PCP and said to come here. And get worked up for sepsis  "Talking out of her head."  Also wants her left arm rechecked from when she was here the 26th. Said its not getting any better.

## 2021-07-20 NOTE — Discharge Instructions (Addendum)
Call your primary care doctor or specialist as discussed in the next 2-3 days.   Return immediately back to the ER if:  Your symptoms worsen within the next 12-24 hours. You develop new symptoms such as new fevers, persistent vomiting, new pain, shortness of breath, or new weakness or numbness, or if you have any other concerns.  

## 2021-07-20 NOTE — ED Provider Notes (Signed)
[facility] EMERGENCY DEPARTMENT Provider Note   CSN: [id number] Arrival date & time: 07/20/21  1901     History Chief Complaint  Patient presents with   Altered Mental Status    Mckenzie [name] is a 75 y.o. female.  Patient brought in by family chief complaint of confusion episodes yesterday and today.  Apparently was mentioning seeing people at home who family states were not there.  This was intermittent yesterday and intermittent today.  Patient was recently diagnosed with vertebral compression fracture and has been taking hydrocodone at home for pain management.  Family took a home urinary tract test which indicated it may be a UTI and she was brought to the ER for evaluation.  However no reports of headache or chest pain or abdominal pain.  No reports of back pain at this time given she was medicated at home.  No reports of urinary frequency or bleeding.      Past Medical History:  Diagnosis Date   Depression    High cholesterol    Hypertension    Retinal micro-aneurysm of right eye     Patient Active Problem List   Diagnosis Date Noted   TIA (transient ischemic attack) 06/26/2021   Hypokalemia 06/26/2021   Temporal arteritis ([place]) 06/26/2021   Acute CVA (cerebrovascular accident) ([place]) 06/26/2021   Compression fracture of T1 vertebra ([place]) 05/25/2021   Compression fracture of T4 vertebra ([place]) 05/25/2021   Fracture of sacrum ([place]) 05/25/2021   Fracture of manubrium 05/25/2021   Low back pain 05/23/2021   Depression 01/23/2021   High risk medication use 01/02/2021   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[place]) 12/27/2020   Pericardial effusion 12/27/2020   Abnormal MRI, cervical spine 12/27/2020   Generalized weakness 12/27/2020   Steroid-induced hyperglycemia 12/27/2020   Malnutrition of moderate degree 12/11/2020   Pressure injury of skin 12/09/2020   Benign neoplasm of cecum    Benign neoplasm of descending colon    Hiatal hernia    Hyponatremia 12/08/2020    Thrombocytosis 12/08/2020   Hypoalbuminemia 12/08/2020   Memory loss 12/08/2020   Constipation 12/06/2020   Anemia of chronic disease 12/06/2020   Gait disturbance 11/14/2020   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 11/14/2020   Cognitive change 11/14/2020   Adjustment disorder with mixed anxiety and depressed mood 10/10/2020   Fracture 10/06/2020   Distal radius fracture 40/98/1191   Acute metabolic encephalopathy 47/82/9562   Proximal weakness of extremity 09/26/2020   Myalgia 09/26/2020   Fall at home, subsequent encounter 09/26/2020   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   Essential hypertension 01/11/2014   Vertigo 01/11/2014   Hyperlipidemia 01/11/2014    Past Surgical History:  Procedure Laterality Date   ARTERY BIOPSY Bilateral 12/15/2020   Procedure: BILATERAL TEMPORAL ARTERY BIOPSIES;  Surgeon: Angelia Mould, MD;  Location: [facility] OR;  Service: Vascular;  Laterality: Bilateral;   COLONOSCOPY WITH PROPOFOL N/A 12/09/2020   Procedure: COLONOSCOPY WITH PROPOFOL;  Surgeon: Gatha Mayer, MD;  Location: [facility] ENDOSCOPY;  Service: Endoscopy;  Laterality: N/A;   ESOPHAGOGASTRODUODENOSCOPY (EGD) WITH PROPOFOL N/A 12/09/2020   Procedure: ESOPHAGOGASTRODUODENOSCOPY (EGD) WITH PROPOFOL;  Surgeon: Gatha Mayer, MD;  Location: [facility];  Service: Endoscopy;  Laterality: N/A;   EYE SURGERY     FINGER SURGERY Left    RING FINGER   IR THORACENTESIS ASP PLEURAL SPACE W/IMG GUIDE  12/13/2020   OPEN REDUCTION INTERNAL FIXATION (ORIF) DISTAL RADIAL FRACTURE Left 10/05/2020   Procedure: OPEN REDUCTION INTERNAL FIXATION (ORIF) DISTAL RADIAL FRACTURE;  Surgeon: Doreatha Martin,  Thomasene Lot, MD;  Location: [place];  Service: Orthopedics;  Laterality: Left;  supraclavicular block   POLYPECTOMY  12/09/2020   Procedure: POLYPECTOMY;  Surgeon: Gatha Mayer, MD;  Location: [facility] ENDOSCOPY;  Service: Endoscopy;;   TUBAL LIGATION       OB History   No obstetric history on file.     Family History  Problem Relation Age  of Onset   Hyperlipidemia Mother    Hypertension Mother    Diabetes Mother    COPD Father    Depression Sister    Suicidality Brother    Drug abuse Brother    Healthy Daughter    Healthy Daughter    Healthy Daughter    Colon cancer Neg Hx    Celiac disease Neg Hx    Inflammatory bowel disease Neg Hx     Social History   Tobacco Use   Smoking status: Former    Types: Cigarettes   Smokeless tobacco: Never   Tobacco comments:    for a short period  Media planner   Vaping Use: Never used  Substance Use Topics   Alcohol use: No   Drug use: No    Home Medications Prior to Admission medications   Medication Sig Start Date End Date Taking? Authorizing Provider  acetaminophen (TYLENOL) 325 MG tablet Take 2 tablets (650 mg total) by mouth every 6 (six) hours as needed for mild pain (or Fever >/= 101). 05/27/21  Yes Jennye Boroughs, MD  amLODipine (NORVASC) 5 MG tablet Take 1 tablet (5 mg total) by mouth daily. 07/10/21  Yes Hendricks Limes F, FNP  aspirin EC 81 MG EC tablet Take 1 tablet (81 mg total) by mouth daily. X one month, then stop 07/02/21  Yes Tat, David, MD  atorvastatin (LIPITOR) 40 MG tablet Take 1 tablet (40 mg total) by mouth daily. 07/02/21  Yes Tat, Shanon Brow, MD  Bromfenac Sodium 0.07 % SOLN Apply 1 drop to eye daily. 1 drop right eye daily   Yes [provider]  COMBIGAN 0.2-0.5 % ophthalmic solution Place 1 drop into both eyes every 12 (twelve) hours. 12/01/13  Yes [provider]  dorzolamide (TRUSOPT) 2 % ophthalmic solution Place 1 drop into the left eye 2 (two) times daily.   Yes [provider]  Ergocalciferol (VITAMIN D2) 50 MCG (2000 UT) TABS Take 1 tablet by mouth daily. 04/30/21  Yes Sater, Nanine Means, MD  escitalopram (LEXAPRO) 20 MG tablet Take 1 tablet (20 mg total) by mouth daily. 01/18/21  Yes Hendricks Limes F, FNP  ferrous sulfate 325 (65 FE) MG tablet Take 325 mg by mouth every other day.   Yes [provider]  fluorometholone  (FML) 0.1 % ophthalmic suspension Place 1 drop into the right eye 2 (two) times daily.   Yes [provider]  furosemide (LASIX) 20 MG tablet Take 10 mg by mouth daily.   Yes [provider]  HYDROcodone-acetaminophen (NORCO/VICODIN) 5-325 MG tablet Take 1 tablet by mouth every 6 (six) hours as needed for severe pain. 07/13/21  Yes Hendricks Limes F, FNP  latanoprost (XALATAN) 0.005 % ophthalmic solution Place 1 drop into both eyes at bedtime. 12/29/15  Yes [provider]  lisinopril (ZESTRIL) 10 MG tablet Take 1 tablet (10 mg total) by mouth daily. 06/22/21  Yes Hendricks Limes F, FNP  MAGNESIUM PO Take 1 tablet by mouth daily.   Yes [provider]  predniSONE (DELTASONE) 10 MG tablet Take 2 tablets (20 mg total) by  mouth daily with breakfast. 07/03/21  Yes Rice, Resa Miner, MD  solifenacin (VESICARE) 10 MG tablet Take 1 tablet (10 mg total) by mouth daily. 07/09/21  Yes Stoneking, Reece Leader., MD  clopidogrel (PLAVIX) 75 MG tablet Take 1 tablet (75 mg total) by mouth daily with breakfast. Patient not taking: No sig reported 07/02/21   Orson Eva, MD  Multiple Vitamin (MULTIVITAMIN) tablet Take 1 tablet by mouth daily.    [provider]  Netarsudil Dimesylate (RHOPRESSA) 0.02 % SOLN Place 1 drop into both eyes at bedtime. Patient not taking: No sig reported    [provider]  sodium chloride 1 g tablet Take 1 g by mouth 2 (two) times daily with a meal. Patient not taking: No sig reported    [provider]  Tocilizumab (ACTEMRA ACTPEN) 162 MG/0.9ML SOAJ Inject 162 mg into the skin once a week. Patient not taking: No sig reported 06/04/21   Collier Salina, MD    Allergies    Aldomet [methyldopa], Fosamax [alendronate sodium], Acetazolamide, and Buspar [buspirone]  Review of Systems   Review of Systems  Constitutional:  Negative for fever.  HENT:  Negative for ear pain.   Eyes:  Negative for pain.  Respiratory:  Negative for  cough.   Cardiovascular:  Negative for chest pain.  Gastrointestinal:  Negative for abdominal pain.  Genitourinary:  Negative for flank pain.  Musculoskeletal:  Negative for neck pain.  Skin:  Negative for rash.  Neurological:  Negative for headaches.   Physical Exam Updated Vital Signs BP (!) 142/65   Pulse 67   Temp 98.2 F (36.8 C) (Oral)   Resp 18   Ht 5\' 5"  (1.651 m)   Wt 57.3 kg   SpO2 95%   BMI 21.03 kg/m   Physical Exam Constitutional:      General: She is not in acute distress.    Appearance: Normal appearance.  HENT:     Head: Normocephalic.     Nose: Nose normal.  Eyes:     Extraocular Movements: Extraocular movements intact.  Cardiovascular:     Rate and Rhythm: Normal rate.  Pulmonary:     Effort: Pulmonary effort is normal.  Musculoskeletal:        General: Normal range of motion.     Cervical back: Normal range of motion.  Neurological:     General: No focal deficit present.     Mental Status: She is alert and oriented to person, place, and time. Mental status is at baseline.     Cranial Nerves: No cranial nerve deficit.     Motor: No weakness.    ED Results / Procedures / Treatments   Labs (all labs ordered are listed, but only abnormal results are displayed) Labs Reviewed  COMPREHENSIVE METABOLIC PANEL - Abnormal; Notable for the following components:      Result Value   Glucose, Bld 140 (*)    Alkaline Phosphatase 138 (*)    All other components within normal limits  CBC WITH DIFFERENTIAL/PLATELET - Abnormal; Notable for the following components:   WBC 12.7 (*)    Hemoglobin 11.9 (*)    Platelets 456 (*)    Neutro Abs 10.8 (*)    Abs Immature Granulocytes 0.08 (*)    All other components within normal limits  URINALYSIS, ROUTINE W REFLEX MICROSCOPIC - Abnormal; Notable for the following components:   APPearance HAZY (*)    Glucose, UA 150 (*)    All other components within normal limits  RESP PANEL BY RT-PCR (FLU A&B, COVID) [id number]   CULTURE, BLOOD (ROUTINE X 2)  CULTURE, BLOOD (ROUTINE X 2)  LACTIC ACID, PLASMA  PROTIME-INR    EKG None  Radiology CT Head Wo Contrast  Result Date: 07/20/2021 CLINICAL DATA:  Mental status change, unknown cause EXAM: CT HEAD WITHOUT CONTRAST TECHNIQUE: Contiguous axial images were obtained from the base of the skull through the vertex without intravenous contrast. COMPARISON:  CT head 07/01/2021. BRAIN: BRAIN Cerebral ventricle sizes are concordant with the degree of cerebral volume loss. Patchy and confluent areas of decreased attenuation are noted throughout the deep and periventricular white matter of the cerebral hemispheres bilaterally, compatible with chronic microvascular ischemic disease. No evidence of large-territorial acute infarction. No parenchymal hemorrhage. No mass lesion. No extra-axial collection. No mass effect or midline shift. No hydrocephalus. Basilar cisterns are patent. Vascular: No hyperdense vessel. Skull: No acute fracture or focal lesion. Sinuses/Orbits: Paranasal sinuses and mastoid air cells are clear. The orbits are unremarkable. Other: None. IMPRESSION: No acute intracranial abnormality. Electronically Signed   By: Iven Finn M.D.   On: 07/20/2021 21:29   DG Chest Port 1 View  Result Date: 07/20/2021 CLINICAL DATA:  Altered mental status. EXAM: PORTABLE CHEST 1 VIEW COMPARISON:  June 25, 2021 FINDINGS: Mild atelectasis is noted within the left lung base. There is no evidence of a pleural effusion or pneumothorax. The heart size and mediastinal contours are within normal limits. No acute osseous abnormalities are identified. IMPRESSION: Mild left basilar atelectasis. Electronically Signed   By: Virgina Norfolk M.D.   On: 07/20/2021 20:06    Procedures Procedures   Medications Ordered in ED Medications  sodium chloride 0.9 % bolus 1,000 mL (0 mLs Intravenous Stopped 07/20/21 2109)    ED Course  I have reviewed the triage vital signs and the  nursing notes.  Pertinent labs & imaging results that were available during my care of the patient were reviewed by me and considered in my medical decision making (see chart for details).    MDM Rules/Calculators/A&P                           Patient on exam alert answers all my questions appropriately. Vital signs are within normal limits.  Work-up is unremarkable for evidence of infection chemistry is normal white count is normal urinalysis normal lactic acid is normal.  I did pursue a CT scan of the brain which is unremarkable per radiology.  Suspect may be related to narcotic use.  Advise decreased usage at home and close monitoring.  Advise follow-up with her doctor within the next 2 to 3 days.  Advised immediate return for fevers worsening symptoms increased confusion or any additional concerns.  At this time she is awake and alert and oriented x3 and appears appropriate.   Final Clinical Impression(s) / ED Diagnoses Final diagnoses:  Confusion    Rx / DC Orders ED Discharge Orders     None        Luna Fuse, MD 07/20/21 2218

## 2021-07-23 NOTE — Progress Notes (Signed)
Office Visit Note  Patient: Mckenzie [name]             Date of Birth: 22-Jan-1946           MRN: [id number]             PCP: Loman Brooklyn, FNP Referring: Loman Brooklyn, FNP Visit Date: 07/24/2021   Subjective:  Follow-up (A lot worse, fell x 2-3 months ago, multiple fractures)   History of Present Illness: Mckenzie [name] is a 75 y.o. female here for follow up for GCA on prednisone 20 mg daily. She has had multiple events since her last visit she was hospitalized with altered mental status found to have a new stroke with significant hypertension.  She started treatment with Plavix and Actemra was discontinued due to concern for contributing to the high blood pressure.  The prednisone dose was increased back to 20 mg daily due to removal of her DMARDs.  She subsequently developed multiple new compression fractures in the thoracic vertebra provoked with minimal trauma even just routine forward bending.  She was evaluated by neurosurgery clinic they are concerned for whether her bone density will tolerate any reconstructive or stabilization procedures.  In the meantime she is taking very low-dose hydrocodone for pain control.  She continues experiencing extensive bruising particularly with a very large hematoma on the left arm at the site of an IV access.  Her mental status has also worsened with some episodes of hallucination or delirium thought to be related to her pain medication or else the illnesses and hospitalization.  Previous HPI 05/23/21 Mckenzie [name] is a 75 y.o. female here for follow up for GCA currently on Actemra and prednisone 10 mg PO daily. Sicne our last visit she experienced increase pain and stiffness in the back and hips and sustained a fall about 2 weeks ago. She was overall doing pretty well felt her treatment control symptoms adequately until after this fall. She struck her head with superficial injury ENT exam was not concerned for complications and no imaging  or further work-up obtained. A week after the fall she is now experiencing increasing pain affecting her chest and in her very low back and sacrum area. The chest pain is not particularly affected by deep inspiration only sometimes also with increased pain reaching overhead. Her low back pain is worsened with standing from a seated position. She denies any radiation to the legs weakness or decreased sensation in her legs.   01/02/21  Mckenzie [name] is a 75 y.o. female here for giant cell arteritis. Symptoms seem to extend back to last year with developing severe stiffness and aches in her upper and lower extremities with progressive decrease in mobility and function. She was hospitalized in December after fall with distal radius fracture. After that she continued overall decline and was admitted to the hospital again in March with hyponatremia affecting weakness and mental status. Workup revealed transudative pericardial effusion also descending thoracic aorta wall thickening and high inflammatory markers concerning for GCA. She underewnt bilateral temporal artery biopsies with pathology consistent with GCA. She started high dose steroids with slow tapering on prednisone and discharged home. Since that time she and her husband have noticed a very large improvement in her status mentally and physically. She is continuing the recommended therapy exercises at home although not in formal PT or home PT yet. Currently at 40mg  per day dose. She denies significant headache, jaw pain besides chronic TMJ, or visual changes.  She reports ophthalmology follow up for her glaucoma noted actually decreased pressures on the prednisone. Cardiology follow up indicates partial reduction of effusion.   12/2020  Vit D 36.27  WBC 12.5  Plt 617   Biopsy from 12/15/20 was consistent with GCA on bilateral specimens   Review of Systems  Constitutional:  Positive for fatigue.  HENT:  Positive for mouth dryness.   Eyes:   Positive for dryness.  Respiratory:  Negative for shortness of breath.   Cardiovascular:  Negative for swelling in legs/feet.  Gastrointestinal:  Positive for constipation.  Endocrine: Positive for increased urination.  Genitourinary:  Negative for difficulty urinating.  Musculoskeletal:  Positive for gait problem, muscle weakness and morning stiffness.  Skin:  Negative for rash.  Allergic/Immunologic: Negative for susceptible to infections.  Neurological:  Positive for weakness.  Hematological:  Positive for bruising/bleeding tendency.  Psychiatric/Behavioral:  Positive for sleep disturbance.    PMFS History:  Patient Active Problem List   Diagnosis Date Noted   Hematoma of arm, left, subsequent encounter 07/24/2021   TIA (transient ischemic attack) 06/26/2021   Hypokalemia 06/26/2021   Acute CVA (cerebrovascular accident) ([place]) 06/26/2021   Compression fracture of T1 vertebra ([place]) 05/25/2021   Compression fracture of T4 vertebra ([place]) 05/25/2021   Fracture of sacrum ([place]) 05/25/2021   Fracture of manubrium 05/25/2021   Low back pain 05/23/2021   Depression 01/23/2021   High risk medication use 01/02/2021   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[place]) 12/27/2020   Pericardial effusion 12/27/2020   Abnormal MRI, cervical spine 12/27/2020   Generalized weakness 12/27/2020   Steroid-induced hyperglycemia 12/27/2020   Malnutrition of moderate degree 12/11/2020   Pressure injury of skin 12/09/2020   Benign neoplasm of cecum    Benign neoplasm of descending colon    Hiatal hernia    Hyponatremia 12/08/2020   Thrombocytosis 12/08/2020   Hypoalbuminemia 12/08/2020   Memory loss 12/08/2020   Constipation 12/06/2020   Anemia of chronic disease 12/06/2020   Gait disturbance 11/14/2020   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 11/14/2020   Cognitive change 11/14/2020   Adjustment disorder with mixed anxiety and depressed mood 10/10/2020   Fracture 10/06/2020   Distal radius fracture 51/11/5850   Acute  metabolic encephalopathy 77/82/4235   Proximal weakness of extremity 09/26/2020   Myalgia 09/26/2020   Fall at home, subsequent encounter 09/26/2020   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   Essential hypertension 01/11/2014   Vertigo 01/11/2014   Hyperlipidemia 01/11/2014    Past Medical History:  Diagnosis Date   Depression    High cholesterol    Hypertension    Retinal micro-aneurysm of right eye     Family History  Problem Relation Age of Onset   Hyperlipidemia Mother    Hypertension Mother    Diabetes Mother    COPD Father    Depression Sister    Suicidality Brother    Drug abuse Brother    Healthy Daughter    Healthy Daughter    Healthy Daughter    Colon cancer Neg Hx    Celiac disease Neg Hx    Inflammatory bowel disease Neg Hx    Past Surgical History:  Procedure Laterality Date   ARTERY BIOPSY Bilateral 12/15/2020   Procedure: BILATERAL TEMPORAL ARTERY BIOPSIES;  Surgeon: Angelia Mould, MD;  Location: [facility] OR;  Service: Vascular;  Laterality: Bilateral;   COLONOSCOPY WITH PROPOFOL N/A 12/09/2020   Procedure: COLONOSCOPY WITH PROPOFOL;  Surgeon: Gatha Mayer, MD;  Location: [facility] ENDOSCOPY;  Service: Endoscopy;  Laterality: N/A;  ESOPHAGOGASTRODUODENOSCOPY (EGD) WITH PROPOFOL N/A 12/09/2020   Procedure: ESOPHAGOGASTRODUODENOSCOPY (EGD) WITH PROPOFOL;  Surgeon: Gatha Mayer, MD;  Location: [place];  Service: Endoscopy;  Laterality: N/A;   EYE SURGERY     FINGER SURGERY Left    RING FINGER   IR THORACENTESIS ASP PLEURAL SPACE W/IMG GUIDE  12/13/2020   OPEN REDUCTION INTERNAL FIXATION (ORIF) DISTAL RADIAL FRACTURE Left 10/05/2020   Procedure: OPEN REDUCTION INTERNAL FIXATION (ORIF) DISTAL RADIAL FRACTURE;  Surgeon: Shona Needles, MD;  Location: [place];  Service: Orthopedics;  Laterality: Left;  supraclavicular block   POLYPECTOMY  12/09/2020   Procedure: POLYPECTOMY;  Surgeon: Gatha Mayer, MD;  Location: [facility] ENDOSCOPY;  Service: Endoscopy;;   TUBAL LIGATION      Social History   Social History Narrative   Right handed   Caffeine use: coffee daily   Immunization History  Administered Date(s) Administered   PFIZER Comirnaty(Gray Top)Covid-19 Tri-Sucrose Vaccine 11/11/2019, 12/06/2019, 10/31/2020   PFIZER(Purple Top)SARS-COV-2 Vaccination 11/11/2019, 12/06/2019, 10/31/2020   Pneumococcal Conjugate-13 08/21/2018   Pneumococcal Polysaccharide-23 07/07/2012   Td 05/31/2011   Tdap 10/07/2010, 05/31/2011     Objective: Vital Signs: BP (!) 147/66 (BP Location: Right Arm, Patient Position: Sitting, Cuff Size: Normal)   Pulse 71   Resp 18    Physical Exam Constitutional:      Comments: Ill appearing In wheelchair Wearing brace over back and upper chest  HENT:     Mouth/Throat:     Mouth: Mucous membranes are moist.     Pharynx: Oropharynx is clear.  Eyes:     Conjunctiva/sclera: Conjunctivae normal.  Cardiovascular:     Rate and Rhythm: Normal rate and regular rhythm.  Pulmonary:     Effort: Pulmonary effort is normal.     Breath sounds: Normal breath sounds.  Musculoskeletal:     Right lower leg: No edema.     Left lower leg: No edema.  Skin:    General: Skin is warm and dry.     Findings: Bruising present.     Comments: Bruises on extensor surfaces on arms and legs, large hematoma on left forearm, friable skin  Neurological:     Mental Status: She is alert.     Musculoskeletal Exam:  Elbows full ROM no tenderness or swelling Wrists full ROM no tenderness or swelling Fingers full ROM no tenderness or swelling Knees full ROM no tenderness or swelling Ankles full ROM no tenderness or swelling   Investigation: No additional findings.  Imaging: DG Lumbar Spine 2-3 Views  Result Date: 07/10/2021 CLINICAL DATA:  Low back pain, recent fall, chronic steroid use EXAM: LUMBAR SPINE - 2-3 VIEW COMPARISON:  05/25/2021 lumbar spine CT FINDINGS: This report assumes 5 non rib-bearing lumbar vertebrae. Severe T12 vertebral compression  fracture, new since 05/25/2021 CT. Lumbar vertebral body heights are preserved, with no fracture. Mild multilevel lumbar degenerative disc disease, most prominent at L4-5. Stable 7 mm anterolisthesis at L4-5. Advanced bilateral lower lumbar facet arthropathy. No aggressive appearing focal osseous lesions. Abdominal aortic atherosclerosis. IMPRESSION: 1. Severe T12 vertebral compression fracture, new since 05/25/2021 CT, either acute or subacute. 2. Mild multilevel lumbar degenerative disc disease with advanced lower lumbar facet arthropathy. Stable 7 mm anterolisthesis at L4-5. These results will be called to the ordering clinician or representative by the Radiology Department at the imaging location. Electronically Signed   By: Ilona Sorrel M.D.   On: 07/10/2021 11:37   CT Head Wo Contrast  Result Date: 07/20/2021 CLINICAL DATA:  Mental  status change, unknown cause EXAM: CT HEAD WITHOUT CONTRAST TECHNIQUE: Contiguous axial images were obtained from the base of the skull through the vertex without intravenous contrast. COMPARISON:  CT head 07/01/2021. BRAIN: BRAIN Cerebral ventricle sizes are concordant with the degree of cerebral volume loss. Patchy and confluent areas of decreased attenuation are noted throughout the deep and periventricular white matter of the cerebral hemispheres bilaterally, compatible with chronic microvascular ischemic disease. No evidence of large-territorial acute infarction. No parenchymal hemorrhage. No mass lesion. No extra-axial collection. No mass effect or midline shift. No hydrocephalus. Basilar cisterns are patent. Vascular: No hyperdense vessel. Skull: No acute fracture or focal lesion. Sinuses/Orbits: Paranasal sinuses and mastoid air cells are clear. The orbits are unremarkable. Other: None. IMPRESSION: No acute intracranial abnormality. Electronically Signed   By: Iven Finn M.D.   On: 07/20/2021 21:29   CT HEAD WO CONTRAST (5MM)  Result Date: 07/01/2021 CLINICAL  DATA:  Stroke suggested scratch set suspect stroke. Neurologic deficit. EXAM: CT HEAD WITHOUT CONTRAST TECHNIQUE: Contiguous axial images were obtained from the base of the skull through the vertex without intravenous contrast. COMPARISON:  Brain MRI 06/26/2021 FINDINGS: Brain: No evidence of acute infarction, hemorrhage, hydrocephalus, extra-axial collection or mass lesion/mass effect. Remote bilateral cerebellar hemisphere infarcts identified, left greater than right. There is mild diffuse low-attenuation within the subcortical and periventricular white matter compatible with chronic microvascular disease. Prominence of the sulci and ventricles compatible with brain atrophy. Vascular: No hyperdense vessel or unexpected calcification. Skull: Normal. Negative for fracture or focal lesion. Sinuses/Orbits: No acute finding. Other: None IMPRESSION: 1. No acute intracranial abnormalities. 2. 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 3. Remote bilateral cerebellar hemisphere infarcts. Electronically Signed   By: Kerby Moors M.D.   On: 07/01/2021 10:55   CT HEAD WO CONTRAST (5MM)  Result Date: 06/25/2021 CLINICAL DATA:  Altered mental status. EXAM: CT HEAD WITHOUT CONTRAST TECHNIQUE: Contiguous axial images were obtained from the base of the skull through the vertex without intravenous contrast. COMPARISON:  December 08, 2020 FINDINGS: Brain: There is mild cerebral atrophy with widening of the extra-axial spaces and ventricular dilatation. There are areas of decreased attenuation within the white matter tracts of the supratentorial brain, consistent with microvascular disease changes. Small areas of cortical encephalomalacia, with adjacent chronic white matter low attenuation are seen within the bilateral cerebellar regions and parasagittal region of the right occipital lobe. Vascular: No hyperdense vessel or unexpected calcification. Skull: Normal. Negative for fracture or focal lesion. Sinuses/Orbits:  No acute finding. Other: None. IMPRESSION: 1. No acute intracranial abnormality. 2. Generalized cerebral atrophy. 3. 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 Electronically Signed   By: Virgina Norfolk M.D.   On: 06/25/2021 23:34   MR ANGIO HEAD WO CONTRAST  Result Date: 06/26/2021 CLINICAL DATA:  Neuro deficit, acute, stroke suspected EXAM: MRI HEAD WITHOUT CONTRAST MRA HEAD WITHOUT CONTRAST TECHNIQUE: Multiplanar, multi-echo pulse sequences of the brain and surrounding structures were acquired without intravenous contrast. Angiographic images of the Circle of Willis were acquired using MRA technique without intravenous contrast. COMPARISON:  No pertinent prior exam. FINDINGS: MRI HEAD Brain: Punctate focus of mildly reduced diffusion in the inferior left frontal lobe (series 11, image 16). There is no acute infarction or intracranial hemorrhage. There is no intracranial mass or mass effect. There is no hydrocephalus or extra-axial fluid collection. Chronic bilateral cerebellar infarcts. Chronic right occipital infarct. Patchy and confluent areas of T2 hyperintensity in the supratentorial and pontine white matter are nonspecific  but may reflect moderate chronic microvascular ischemic changes. Vascular: Major vessel flow voids at the skull base are preserved. Skull and upper cervical spine: Normal marrow signal is preserved. Sinuses/Orbits: Paranasal sinuses are aerated. Orbits are unremarkable. Other: Sella is unremarkable.  Mastoid air cells are clear. MRA HEAD Motion artifact present. Intracranial internal carotid arteries are patent with atherosclerotic irregularity. Middle and anterior cerebral arteries are patent. Intracranial vertebral arteries, basilar artery, posterior cerebral arteries are patent. There is no significant stenosis. IMPRESSION: Punctate acute infarct inferior left frontal lobe. Chronic right occipital and bilateral cerebellar infarcts. Moderate chronic  microvascular ischemic changes. No proximal intracranial vessel occlusion or significant stenosis. Electronically Signed   By: Macy Mis M.D.   On: 06/26/2021 09:49   MR BRAIN WO CONTRAST  Result Date: 06/26/2021 CLINICAL DATA:  Neuro deficit, acute, stroke suspected EXAM: MRI HEAD WITHOUT CONTRAST MRA HEAD WITHOUT CONTRAST TECHNIQUE: Multiplanar, multi-echo pulse sequences of the brain and surrounding structures were acquired without intravenous contrast. Angiographic images of the Circle of Willis were acquired using MRA technique without intravenous contrast. COMPARISON:  No pertinent prior exam. FINDINGS: MRI HEAD Brain: Punctate focus of mildly reduced diffusion in the inferior left frontal lobe (series 11, image 16). There is no acute infarction or intracranial hemorrhage. There is no intracranial mass or mass effect. There is no hydrocephalus or extra-axial fluid collection. Chronic bilateral cerebellar infarcts. Chronic right occipital infarct. Patchy and confluent areas of T2 hyperintensity in the supratentorial and pontine white matter are nonspecific but may reflect moderate chronic microvascular ischemic changes. Vascular: Major vessel flow voids at the skull base are preserved. Skull and upper cervical spine: Normal marrow signal is preserved. Sinuses/Orbits: Paranasal sinuses are aerated. Orbits are unremarkable. Other: Sella is unremarkable.  Mastoid air cells are clear. MRA HEAD Motion artifact present. Intracranial internal carotid arteries are patent with atherosclerotic irregularity. Middle and anterior cerebral arteries are patent. Intracranial vertebral arteries, basilar artery, posterior cerebral arteries are patent. There is no significant stenosis. IMPRESSION: Punctate acute infarct inferior left frontal lobe. Chronic right occipital and bilateral cerebellar infarcts. Moderate chronic microvascular ischemic changes. No proximal intracranial vessel occlusion or significant stenosis.  Electronically Signed   By: Macy Mis M.D.   On: 06/26/2021 09:49   DG Chest Port 1 View  Result Date: 07/20/2021 CLINICAL DATA:  Altered mental status. EXAM: PORTABLE CHEST 1 VIEW COMPARISON:  June 25, 2021 FINDINGS: Mild atelectasis is noted within the left lung base. There is no evidence of a pleural effusion or pneumothorax. The heart size and mediastinal contours are within normal limits. No acute osseous abnormalities are identified. IMPRESSION: Mild left basilar atelectasis. Electronically Signed   By: Virgina Norfolk M.D.   On: 07/20/2021 20:06   DG Chest Portable 1 View  Result Date: 06/25/2021 CLINICAL DATA:  Altered mental status. EXAM: PORTABLE CHEST 1 VIEW COMPARISON:  Chest radiograph dated 05/23/2021. FINDINGS: Focal consolidation, pleural effusion or pneumothorax mild cardiomegaly. No acute osseous pathology. With degenerative changes of the spine and shoulders. IMPRESSION: No active cardiopulmonary disease. Electronically Signed   By: Anner Crete M.D.   On: 06/25/2021 23:42   EEG adult  Result Date: 06/27/2021 Phillips Odor, MD     06/27/2021  6:26 PM [place] A. [name], MD     [email or web address]       HISTORY: The patient is 34 and presents with altered mental status. 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 MEDICATIONS: Current Facility-Administered Medications:   acetaminophen (TYLENOL) tablet 650  mg, 650 mg, Oral, Q4H PRN **OR** acetaminophen (TYLENOL) 160 MG/5ML solution 650 mg, 650 mg, Per Tube, Q4H PRN **OR** acetaminophen (TYLENOL) suppository 650 mg, 650 mg, Rectal, Q4H PRN, Zierle-Ghosh, Asia B, DO   amLODipine (NORVASC) tablet 5 mg, 5 mg, Oral, Daily, Amin, Ankit Chirag, MD, 5 mg at 06/27/21 1126   aspirin EC tablet 81 mg, 81 mg, Oral, Daily, Zierle-Ghosh, Asia B, DO, 81 mg at 06/27/21 1124   atorvastatin (LIPITOR) tablet 40 mg, 40 mg, Oral, Daily, Amin, Ankit Chirag, MD, 40 mg at 06/27/21 1125    brimonidine (ALPHAGAN) 0.2 % ophthalmic solution 1 drop, 1 drop, Both Eyes, BID, 1 drop at 06/27/21 1212 **AND** timolol (TIMOPTIC) 0.5 % ophthalmic solution 1 drop, 1 drop, Both Eyes, BID, Hall, Scott A, RPH, 1 drop at 06/27/21 1217   clopidogrel (PLAVIX) tablet 75 mg, 75 mg, Oral, Q breakfast, [place], Kofi, MD, 75 mg at 06/27/21 1125   dorzolamide (TRUSOPT) 2 % ophthalmic solution 1 drop, 1 drop, Both Eyes, BID, Amin, Ankit Chirag, MD, 1 drop at 06/27/21 1221   escitalopram (LEXAPRO) tablet 20 mg, 20 mg, Oral, Daily, Zierle-Ghosh, Asia B, DO, 20 mg at 06/27/21 1123   fluorometholone (FML) 0.1 % ophthalmic suspension 1 drop, 1 drop, Right Eye, QID, Amin, Ankit Chirag, MD, 1 drop at 06/27/21 1219   haloperidol (HALDOL) tablet 1 mg, 1 mg, Oral, Q6H PRN **OR** haloperidol lactate (HALDOL) injection 2 mg, 2 mg, Intramuscular, Q6H PRN, Amin, Ankit Chirag, MD   heparin injection 5,000 Units, 5,000 Units, Subcutaneous, Q8H, Zierle-Ghosh, Asia B, DO, 5,000 Units at 06/27/21 0518   hydrALAZINE (APRESOLINE) injection 10 mg, 10 mg, Intravenous, Q4H PRN, Amin, Ankit Chirag, MD, 10 mg at 06/26/21 1136   ipratropium-albuterol (DUONEB) 0.5-2.5 (3) MG/3ML nebulizer solution 3 mL, 3 mL, Nebulization, Q4H PRN, Amin, Ankit Chirag, MD   ketorolac (ACULAR) 0.5 % ophthalmic solution 1 drop, 1 drop, Right Eye, Daily, Amin, Ankit Chirag, MD   latanoprost (XALATAN) 0.005 % ophthalmic solution 1 drop, 1 drop, Both Eyes, QHS, Amin, Ankit Chirag, MD, 1 drop at 06/26/21 2303   lisinopril (ZESTRIL) tablet 10 mg, 10 mg, Oral, Daily, Amin, Ankit Chirag, MD, 10 mg at 06/27/21 1125   metoprolol tartrate (LOPRESSOR) injection 5 mg, 5 mg, Intravenous, Q4H PRN, Amin, Ankit Chirag, MD   Netarsudil Dimesylate 0.02 % SOLN 1 drop, 1 drop, Both Eyes, QHS, Amin, Ankit Chirag, MD   predniSONE (DELTASONE) tablet 10 mg, 10 mg, Oral, Q breakfast, Zierle-Ghosh, Asia B, DO, 10 mg at 06/27/21 1214   senna-docusate (Senokot-S) tablet 1 tablet, 1 tablet, Oral, QHS  PRN, Zierle-Ghosh, Asia B, DO   traZODone (DESYREL) tablet 50 mg, 50 mg, Oral, QHS PRN, Amin, Ankit Chirag, MD ANALYSIS: A 16 channel recording using standard 10 20 measurements is conducted for 23 minutes.  The background activity gets as high as 6-7 hertz. There is beta activity observed in the frontal areas. Most of the recording occurs during sleep with K complexes, spindles and even slow-wave activity. There is no focal or lateral slowing. There is no epileptiform activities noted. IMPRESSION:  1.  This recording of the awake and sleep states shows some mild to moderate global slowing. However, no epileptiform activities are noted. [place]ofi A. [name], M.D. Diplomate, Tax adviser of Psychiatry and Neurology ( Neurology).   DG FLUORO GUIDED NEEDLE PLC ASPIRATION/INJECTION LOC  Result Date: 06/28/2021 CLINICAL DATA:  Altered mental status EXAM: DIAGNOSTIC LUMBAR PUNCTURE UNDER FLUOROSCOPIC GUIDANCE COMPARISON:  CT head 06/25/2021 FLUOROSCOPY TIME:  Fluoroscopy Time:  0 minutes 12 seconds Radiation Exposure Index (if provided by the fluoroscopic device): 1.6 mGy Number of Acquired Spot Images: 1 PROCEDURE: Procedure, benefits, and risks were discussed with the patient, including alternatives. Patient's questions were answered. Written informed consent was obtained. Timeout protocol followed. Patient placed prone. L4-L5 disc space was localized under fluoroscopy. Skin prepped and draped in usual sterile fashion. Skin and soft tissues anesthetized with 4 mL of 1% lidocaine. 22 gauge needle was advanced into the spinal canal where clear colorless CSF was encountered with an opening pressure of 9 cm H2O (LLD). 10 mL of CSF was obtained in 4 tubes for requested analysis. Procedure tolerated very well by patient without immediate complication. IMPRESSION: Successful lumbar puncture as above Electronically Signed   By: Lavonia Dana M.D.   On: 06/28/2021 14:34   ECHOCARDIOGRAM COMPLETE  Result Date: 06/27/2021     ECHOCARDIOGRAM REPORT   Patient Name:   Mckenzie [name] Date of Exam: 06/27/2021 Medical Rec #:  [id number]         Height:       65.0 in Accession #:    [id number]        Weight:       130.1 lb Date of Birth:  [birth year]          BSA:          1.648 m Patient Age:    62 years          BP:           168/101 mmHg Patient Gender: F                 HR:           77 bpm. Exam Location:  [facility] Procedure: 2D Echo, Cardiac Doppler and Color Doppler Indications:    TIA  History:        Patient has prior history of Echocardiogram examinations, most                 recent 12/12/2020. TIA; Risk Factors:Hypertension and                 Dyslipidemia. Pericardial effusion.  Sonographer:    Wenda Low Referring Phys: [id number] [place] B [place]  1. Left ventricular ejection fraction, by estimation, is 60 to 65%. The left ventricle has normal function. The left ventricle has no regional wall motion abnormalities. There is mild left ventricular hypertrophy. Left ventricular diastolic parameters are consistent with Grade I diastolic dysfunction (impaired relaxation).  2. Right ventricular systolic function is normal. The right ventricular size is normal. Tricuspid regurgitation signal is inadequate for assessing PA pressure.  3. The mitral valve is normal in structure. Mild mitral valve regurgitation. No evidence of mitral stenosis.  4. The aortic valve is tricuspid. Aortic valve regurgitation is not visualized. No aortic stenosis is present. FINDINGS  Left Ventricle: Left ventricular ejection fraction, by estimation, is 60 to 65%. The left ventricle has normal function. The left ventricle has no regional wall motion abnormalities. The left ventricular internal cavity size was normal in size. There is  mild left ventricular hypertrophy. Left ventricular diastolic parameters are consistent with Grade I diastolic dysfunction (impaired relaxation). Normal left ventricular filling pressure. Right Ventricle: The right  ventricular size is normal. No increase in right ventricular wall thickness. Right ventricular systolic function is normal. Tricuspid regurgitation signal is inadequate for assessing PA pressure. Left Atrium: Left atrial size was normal in size. Right  Atrium: Right atrial size was normal in size. Pericardium: There is no evidence of pericardial effusion. Mitral Valve: The mitral valve is normal in structure. Mild mitral valve regurgitation. No evidence of mitral valve stenosis. MV peak gradient, 3.0 mmHg. The mean mitral valve gradient is 1.0 mmHg. Tricuspid Valve: The tricuspid valve is normal in structure. Tricuspid valve regurgitation is trivial. No evidence of tricuspid stenosis. Aortic Valve: The aortic valve is tricuspid. Aortic valve regurgitation is not visualized. No aortic stenosis is present. Aortic valve mean gradient measures 4.0 mmHg. Aortic valve peak gradient measures 7.4 mmHg. Aortic valve area, by VTI measures 1.63 cm. Pulmonic Valve: The pulmonic valve was not well visualized. Pulmonic valve regurgitation is not visualized. No evidence of pulmonic stenosis. Aorta: The aortic root is normal in size and structure. Venous: The inferior vena cava was not well visualized. IAS/Shunts: No atrial level shunt detected by color flow Doppler.  LEFT VENTRICLE PLAX 2D LVIDd:         4.20 cm     Diastology LVIDs:         2.90 cm     LV e' medial:   6.53 cm/s LV PW:         1.00 cm     LV E/e' medial: 7.1 LV IVS:        1.20 cm LVOT diam:     1.90 cm LV SV:         43 LV SV Index:   26 LVOT Area:     2.84 cm  LV Volumes (MOD) LV vol d, MOD A2C: 59.7 ml LV vol d, MOD A4C: 69.4 ml LV vol s, MOD A2C: 32.4 ml LV vol s, MOD A4C: 31.4 ml LV SV MOD A2C:     27.3 ml LV SV MOD A4C:     69.4 ml LV SV MOD BP:      35.6 ml RIGHT VENTRICLE RV Basal diam:  2.90 cm RV Mid diam:    2.90 cm RV S prime:     10.00 cm/s TAPSE (M-mode): 1.8 cm LEFT ATRIUM             Index       RIGHT ATRIUM           Index LA diam:        2.70 cm  1.64 cm/m  RA Area:     13.30 cm LA Vol (A2C):   36.7 ml 22.28 ml/m RA Volume:   28.70 ml  17.42 ml/m LA Vol (A4C):   32.2 ml 19.54 ml/m LA Biplane Vol: 34.7 ml 21.06 ml/m  AORTIC VALVE                   PULMONIC VALVE AV Area (Vmax):    1.68 cm    PV Vmax:       1.11 m/s AV Area (Vmean):   1.61 cm    PV Peak grad:  4.9 mmHg AV Area (VTI):     1.63 cm AV Vmax:           136.00 cm/s AV Vmean:          96.800 cm/s AV VTI:            0.263 m AV Peak Grad:      7.4 mmHg AV Mean Grad:      4.0 mmHg LVOT Vmax:         80.70 cm/s LVOT Vmean:        54.900 cm/s  LVOT VTI:          0.151 m LVOT/AV VTI ratio: 0.57  AORTA Ao Root diam: 2.70 cm Ao Asc diam:  3.20 cm MITRAL VALVE MV Area (PHT): 3.61 cm    SHUNTS MV Area VTI:   2.48 cm    Systemic VTI:  0.15 m MV Peak grad:  3.0 mmHg    Systemic Diam: 1.90 cm MV Mean grad:  1.0 mmHg MV Vmax:       0.86 m/s MV Vmean:      47.8 cm/s MV Decel Time: 210 msec MV E velocity: 46.40 cm/s MV A velocity: 77.90 cm/s MV E/A ratio:  0.60 Carlyle Dolly MD Electronically signed by Carlyle Dolly MD Signature Date/Time: 06/27/2021/12:51:49 PM    Final     Recent Labs: Lab Results  Component Value Date   WBC 12.7 (H) 07/20/2021   HGB 11.9 (L) 07/20/2021   PLT 456 (H) 07/20/2021   NA 135 07/20/2021   K 4.3 07/20/2021   CL 99 07/20/2021   CO2 29 07/20/2021   GLUCOSE 140 (H) 07/20/2021   BUN 21 07/20/2021   CREATININE 0.87 07/20/2021   BILITOT 0.4 07/20/2021   ALKPHOS 138 (H) 07/20/2021   AST 19 07/20/2021   ALT 20 07/20/2021   PROT 6.6 07/20/2021   ALBUMIN 3.6 07/20/2021   CALCIUM 9.1 07/20/2021   GFRAA 120 12/01/2020   QFTBGOLDPLUS NEGATIVE 01/02/2021    Speciality Comments: Actemra started 03/12/21 (receives through [facility] patient assistance)  Procedures:  No procedures performed Allergies: Aldomet [methyldopa], Fosamax [alendronate sodium], Acetazolamide, and Buspar [buspirone]   Assessment / Plan:     Visit Diagnoses: 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  She is having numerous complications from the prednisone treatment we need to taper this down again.  Decrease to 15 mg prednisone daily and will taper down with a dose reduction each 2 weeks down to 10 mg then 7.5 then 5 mg daily.  Plan to recheck inflammatory markers and clinical exam at that follow-up in about 8 weeks.  Secondary glaucoma, unspecified glaucoma stage, unspecified laterality  She had surgery at [facility] that was well-tolerated with improvement of the glaucoma no visual complaints at this time.  High risk medication use  She is already having multiple sequelae of the long term steroid treatment with bruising and the severe osteoporosis. We are tapering the medication at this time. We would need to have a realistic risks vs benefits discussion about osteoporosis treatment risk reduction, she previously did not tolerate fosamax. For right now not trying to start new medications.  Hematoma of arm, left, subsequent encounter  Current left arm hematoma this was already evaluated at the hospital not thought to require evacuation currently.  Combination of high-dose prednisone and Plavix remains at high risk for additional bruising or bleeding.  Orders: No orders of the defined types were placed in this encounter.  Meds ordered this encounter  Medications   predniSONE (DELTASONE) 5 MG tablet    Sig: Take 3 tablets (15 mg total) by mouth daily with breakfast for 14 days, THEN 2 tablets (10 mg total) daily with breakfast for 14 days, THEN 1.5 tablets (7.5 mg total) daily with breakfast for 14 days, THEN 1 tablet (5 mg total) daily with breakfast for 14 days.    Dispense:  105 tablet  Refill:  0      Follow-Up Instructions: No follow-ups on file.   Collier Salina, MD  Note - This record has been  created using [employer].  Chart creation errors have been sought, but may not always  have been located. Such creation errors do not reflect on  the standard of medical care.

## 2021-07-24 ENCOUNTER — Encounter: Payer: Self-pay | Admitting: Internal Medicine

## 2021-07-24 ENCOUNTER — Ambulatory Visit (INDEPENDENT_AMBULATORY_CARE_PROVIDER_SITE_OTHER): Payer: Medicare Other | Admitting: Internal Medicine

## 2021-07-24 ENCOUNTER — Other Ambulatory Visit: Payer: Self-pay

## 2021-07-24 VITALS — BP 147/66 | HR 71 | Resp 18

## 2021-07-24 DIAGNOSIS — Z79899 Other long term (current) drug therapy: Secondary | ICD-10-CM | POA: Diagnosis not present

## 2021-07-24 DIAGNOSIS — M316 Other giant cell arteritis: Secondary | ICD-10-CM | POA: Diagnosis not present

## 2021-07-24 DIAGNOSIS — S40022D Contusion of left upper arm, subsequent encounter: Secondary | ICD-10-CM | POA: Insufficient documentation

## 2021-07-24 DIAGNOSIS — H4050X0 Glaucoma secondary to other eye disorders, unspecified eye, stage unspecified: Secondary | ICD-10-CM

## 2021-07-24 MED ORDER — PREDNISONE 5 MG PO TABS: ORAL_TABLET | ORAL | 0 refills | Status: AC

## 2021-07-24 NOTE — Patient Instructions (Addendum)
We need to decrease the prednisone I recommend going back on track can try to decrease the dose every 2 weeks.   Decrease daily dose to 15 mg, 10 mg, 7.5 mg, then 5 mg daily.   We can follow up in about 8 weeks to recheck on the reduced dose and check labs at that time.   You can finish using the 10 mg tablets as well but I am sending new prescriptions with the smaller 5 mg tablets today.

## 2021-07-25 ENCOUNTER — Ambulatory Visit: Payer: Medicare Other | Admitting: Internal Medicine

## 2021-07-25 LAB — CULTURE, BLOOD (ROUTINE X 2)
Culture: NO GROWTH
Culture: NO GROWTH
Special Requests: ADEQUATE
Special Requests: ADEQUATE

## 2021-07-26 ENCOUNTER — Ambulatory Visit: Payer: Medicare Other | Admitting: Neurology

## 2021-07-26 ENCOUNTER — Telehealth: Payer: Self-pay | Admitting: *Deleted

## 2021-07-26 NOTE — Telephone Encounter (Signed)
VM from [facility] FYI: pt requested to be discharged from all [facility] services, PT & OT at this time d/t back pain

## 2021-08-01 ENCOUNTER — Encounter: Payer: Self-pay | Admitting: Family Medicine

## 2021-08-01 NOTE — Telephone Encounter (Signed)
I called and spoke with Mckenzie [name] about her MyChart message. I explained the issues the neurosurgeon is having with treatment options. We can do a hospital bed in a face-to-face encounter. We can most definitely treat her pain. Video visit schedule for tomorrow afternoon.

## 2021-08-02 ENCOUNTER — Telehealth (INDEPENDENT_AMBULATORY_CARE_PROVIDER_SITE_OTHER): Payer: Medicare Other | Admitting: Family Medicine

## 2021-08-02 DIAGNOSIS — M546 Pain in thoracic spine: Secondary | ICD-10-CM | POA: Diagnosis not present

## 2021-08-02 DIAGNOSIS — G8929 Other chronic pain: Secondary | ICD-10-CM

## 2021-08-02 DIAGNOSIS — R531 Weakness: Secondary | ICD-10-CM

## 2021-08-02 DIAGNOSIS — R350 Frequency of micturition: Secondary | ICD-10-CM

## 2021-08-02 DIAGNOSIS — S22010D Wedge compression fracture of first thoracic vertebra, subsequent encounter for fracture with routine healing: Secondary | ICD-10-CM | POA: Diagnosis not present

## 2021-08-02 DIAGNOSIS — T148XXA Other injury of unspecified body region, initial encounter: Secondary | ICD-10-CM

## 2021-08-02 MED ORDER — HYDROCODONE-ACETAMINOPHEN 5-325 MG PO TABS: 1.0000 | ORAL_TABLET | Freq: Four times a day (QID) | ORAL | 0 refills | Status: DC | PRN

## 2021-08-02 MED ORDER — BEDPAN MISC: 1.0000 | Freq: Once | 0 refills | Status: AC

## 2021-08-02 NOTE — Progress Notes (Signed)
Virtual Visit via Video note  I connected with Mckenzie [name] on 08/02/21 at 4:07 PM by video and verified that I am speaking with the correct person using two identifiers. Mckenzie [name] is currently located at home and her husband and daughter are currently with her during visit. The provider, Loman Brooklyn, FNP is located in their office at time of visit.  I discussed the limitations, risks, security and privacy concerns of performing an evaluation and management service by video and the availability of in person appointments. I also discussed with the patient that there may be a patient responsible charge related to this service. The patient expressed understanding and agreed to proceed.  Subjective: PCP: Loman Brooklyn, FNP  Chief Complaint  Patient presents with   Pain   Hospital bed   Patient is in need of a refill of pain medication that she takes due to multiple thoracic compression fractures. She has been evaluated by a neurosurgeon, but at this time does not have treatment options. A kyphoplasty is not going to help. She is not a candidate for surgery due to recent CVA, but even if she was, there is not a lot of good bone to complete the surgery to stabilize the fractures. Her rheumatologist is in the process of weaning down her Prednisone. She did present to the ER due to altered mental status which was felt to be due to her pain medication, but her daughter reports she has been tolerating the pain medication well. The only time that happened was when she took CBD for the pain.   She is in need of medical equipment in the house due to her compression fractures. She currently has a bedside commode, but has a hard time getting up to use it. Her husband has been wrapping a gait belt around her to pull her to standing. She is urinating frequently during the night and was previously seeing a urologist for this who ordered a [facility]. The patient's daughter reports insurance told  them they will not cover the [facility] system. They are questioning if a foley catheter would be an option.   She is in need of a hospital bed. She is suffering greatly in pain from these compression fractures which she has no treatment options for. A hospital bed will alleviate pain by allowing her body to be positioned in ways not feasible with a normal bed. Pain episodes frequently require immediate changes in body position which cannot be achieved with a normal bed.   She is also in need of a wheelchair. She is unable to ambulate with a cane, walker, or crutch due to these multiple compression fractures and generalized weakness. She has been unable to get around the house without her family picking her up and moving her around. She is unable to participate in physical therapy due to the fractures.   ROS: Per HPI  Current Outpatient Medications:    acetaminophen (TYLENOL) 325 MG tablet, Take 2 tablets (650 mg total) by mouth every 6 (six) hours as needed for mild pain (or Fever >/= 101)., Disp: , Rfl:    amLODipine (NORVASC) 5 MG tablet, Take 1 tablet (5 mg total) by mouth daily., Disp: 90 tablet, Rfl: 1   aspirin EC 81 MG EC tablet, Take 1 tablet (81 mg total) by mouth daily. X one month, then stop, Disp: 30 tablet, Rfl: 0   atorvastatin (LIPITOR) 40 MG tablet, Take 1 tablet (40 mg total) by mouth daily., Disp: 30  tablet, Rfl: 1   Bromfenac Sodium 0.07 % SOLN, Apply 1 drop to eye daily. 1 drop right eye daily, Disp: , Rfl:    clopidogrel (PLAVIX) 75 MG tablet, Take 1 tablet (75 mg total) by mouth daily with breakfast. (Patient not taking: No sig reported), Disp: 30 tablet, Rfl: 2   COMBIGAN 0.2-0.5 % ophthalmic solution, Place 1 drop into both eyes every 12 (twelve) hours., Disp: , Rfl:    dorzolamide (TRUSOPT) 2 % ophthalmic solution, Place 1 drop into the left eye 2 (two) times daily., Disp: , Rfl:    Ergocalciferol (VITAMIN D2) 50 MCG (2000 UT) TABS, Take 1 tablet by mouth daily., Disp: 30  tablet, Rfl: 6   escitalopram (LEXAPRO) 20 MG tablet, Take 1 tablet (20 mg total) by mouth daily., Disp: 90 tablet, Rfl: 1   ferrous sulfate 325 (65 FE) MG tablet, Take 325 mg by mouth every other day., Disp: , Rfl:    fluorometholone (FML) 0.1 % ophthalmic suspension, Place 1 drop into the right eye 2 (two) times daily., Disp: , Rfl:    furosemide (LASIX) 20 MG tablet, Take 10 mg by mouth daily., Disp: , Rfl:    HYDROcodone-acetaminophen (NORCO/VICODIN) 5-325 MG tablet, Take 1 tablet by mouth every 6 (six) hours as needed for severe pain., Disp: 20 tablet, Rfl: 0   latanoprost (XALATAN) 0.005 % ophthalmic solution, Place 1 drop into both eyes at bedtime., Disp: , Rfl:    lisinopril (ZESTRIL) 10 MG tablet, Take 1 tablet (10 mg total) by mouth daily., Disp: 30 tablet, Rfl: 2   MAGNESIUM PO, Take 1 tablet by mouth daily., Disp: , Rfl:    Multiple Vitamin (MULTIVITAMIN) tablet, Take 1 tablet by mouth daily., Disp: , Rfl:    Netarsudil Dimesylate (RHOPRESSA) 0.02 % SOLN, Place 1 drop into both eyes at bedtime. (Patient not taking: No sig reported), Disp: , Rfl:    predniSONE (DELTASONE) 5 MG tablet, Take 3 tablets (15 mg total) by mouth daily with breakfast for 14 days, THEN 2 tablets (10 mg total) daily with breakfast for 14 days, THEN 1.5 tablets (7.5 mg total) daily with breakfast for 14 days, THEN 1 tablet (5 mg total) daily with breakfast for 14 days., Disp: 105 tablet, Rfl: 0   sodium chloride 1 g tablet, Take 1 g by mouth 2 (two) times daily with a meal. (Patient not taking: No sig reported), Disp: , Rfl:    solifenacin (VESICARE) 10 MG tablet, Take 1 tablet (10 mg total) by mouth daily., Disp: 30 tablet, Rfl: 11  Allergies  Allergen Reactions   Aldomet [Methyldopa] Other (See Comments)    flu   Fosamax [Alendronate Sodium]    Acetazolamide Rash   Buspar [Buspirone] Palpitations   Past Medical History:  Diagnosis Date   Depression    High cholesterol    Hypertension    Retinal  micro-aneurysm of right eye     Observations/Objective: Physical Exam Constitutional:      General: She is not in acute distress.    Appearance: Normal appearance. She is not ill-appearing or toxic-appearing.     Comments: Lying in bed.  Eyes:     General: No scleral icterus.       Right eye: No discharge.        Left eye: No discharge.     Conjunctiva/sclera: Conjunctivae normal.  Pulmonary:     Effort: Pulmonary effort is normal. No respiratory distress.  Neurological:     Mental Status: She is alert  and oriented to person, place, and time.  Psychiatric:        Mood and Affect: Mood normal.        Behavior: Behavior normal.        Thought Content: Thought content normal.        Judgment: Judgment normal.   Assessment and Plan: 1. Multiple closed and compound fractures No treatment option available besides pain management at this time. - For home use only DME Hospital bed - For home use only DME standard manual wheelchair with seat cushion - Incontinence Supplies (BEDPAN) MISC; 1 each by Does not apply route once for 1 dose.  Dispense: 1 each; Refill: 0  2. Chronic midline thoracic back pain Starting patient on Norco throughout the day due to multiple compression fractures for which she currently has no treatment options besides pain management.  - HYDROcodone-acetaminophen (NORCO/VICODIN) 5-325 MG tablet; Take 1 tablet by mouth every 6 (six) hours as needed for severe pain.  Dispense: 120 tablet; Refill: 0 - For home use only DME Hospital bed - HYDROcodone-acetaminophen (NORCO/VICODIN) 5-325 MG tablet; Take 1 tablet by mouth every 6 (six) hours as needed for severe pain.  Dispense: 120 tablet; Refill: 0 - HYDROcodone-acetaminophen (NORCO/VICODIN) 5-325 MG tablet; Take 1 tablet by mouth every 6 (six) hours as needed for severe pain.  Dispense: 120 tablet; Refill: 0 - For home use only DME standard manual wheelchair with seat cushion - Incontinence Supplies (BEDPAN) MISC; 1 each  by Does not apply route once for 1 dose.  Dispense: 1 each; Refill: 0  3. Generalized weakness - For home use only DME Hospital bed - For home use only DME standard manual wheelchair with seat cushion - Incontinence Supplies (BEDPAN) MISC; 1 each by Does not apply route once for 1 dose.  Dispense: 1 each; Refill: 0  4. Urinary frequency I will call urology and discuss options for her.    Follow Up Instructions: Return in about 6 weeks (around 09/13/2021) for follow-up of chronic medication conditions.   I discussed the assessment and treatment plan with the patient. The patient was provided an opportunity to ask questions and all were answered. The patient agreed with the plan and demonstrated an understanding of the instructions.   The patient was advised to call back or seek an in-person evaluation if the symptoms worsen or if the condition fails to improve as anticipated.  The above assessment and management plan was discussed with the patient. The patient verbalized understanding of and has agreed to the management plan. Patient is aware to call the clinic if symptoms persist or worsen. Patient is aware when to return to the clinic for a follow-up visit. Patient educated on when it is appropriate to go to the emergency department.   Time call ended: 4:24 PM  I provided 17 minutes of face-to-face time during this encounter.   Hendricks Limes, MSN, APRN, FNP-C [facility] 08/02/21

## 2021-08-03 ENCOUNTER — Telehealth: Payer: Self-pay | Admitting: Family Medicine

## 2021-08-03 NOTE — Telephone Encounter (Signed)
Faxed to [facility] as requested

## 2021-08-03 NOTE — Telephone Encounter (Signed)
RX for hospital bed went to [facility] and they [employer]. Daughter states the RX needs to some where that files insurance.

## 2021-08-03 NOTE — Telephone Encounter (Signed)
Spoke with daughter and advised her that Mckenzie [name] is faxing the order to [facility] in [place].

## 2021-08-05 ENCOUNTER — Encounter: Payer: Self-pay | Admitting: Family Medicine

## 2021-08-07 ENCOUNTER — Telehealth: Payer: Self-pay

## 2021-08-07 NOTE — Telephone Encounter (Signed)
Ms. Blanch Media made aware and would like to proceed with home health cath placement. Ms. Blanch Media states she has informed the patient/family of the risks of infection associated with an indwelling foley catheter.

## 2021-08-07 NOTE — Telephone Encounter (Signed)
Patient's PCP called requesting advice concerning patient. Ms. Mckenzie [name] states that patient/family is requesting foley cath placement due to patients health condition and lack of mobility.  Family is unable to afford [employer]. Ms. Mckenzie [name] would like your thoughts on foley cath placement.

## 2021-08-08 ENCOUNTER — Other Ambulatory Visit: Payer: Self-pay

## 2021-08-08 ENCOUNTER — Other Ambulatory Visit: Payer: Self-pay | Admitting: Neurosurgery

## 2021-08-08 DIAGNOSIS — N3941 Urge incontinence: Secondary | ICD-10-CM

## 2021-08-08 NOTE — Progress Notes (Signed)
Patients PCP Hendricks Limes, FNP called inquiring on Dr. Carlyle Lipa  advice on foley catheter placement for patient due to decrease in mobility and increased pain. Per Dr. Felipa Eth ok to place foley catheter for management of bladder on a short term basis.  Home Health order placed and faxed to [facility]  [phone]

## 2021-08-08 NOTE — Telephone Encounter (Signed)
I'm placing the referral for home health. Would 3- 4 months qualify for a short-term basis?

## 2021-08-09 ENCOUNTER — Other Ambulatory Visit: Payer: Self-pay

## 2021-08-09 ENCOUNTER — Encounter (HOSPITAL_COMMUNITY): Payer: Self-pay | Admitting: Neurosurgery

## 2021-08-09 NOTE — Anesthesia Preprocedure Evaluation (Addendum)
Anesthesia Evaluation  Patient identified by MRN, date of birth, ID band Patient awake    Reviewed: Allergy & Precautions, NPO status , Patient's Chart, lab work & pertinent test results  History of Anesthesia Complications Negative for: history of anesthetic complications  Airway Mallampati: II  TM Distance: >3 FB Neck ROM: Full    Dental  (+) Missing, Dental Advisory Given   Pulmonary neg pulmonary ROS, former smoker,    Pulmonary exam normal        Cardiovascular hypertension, Pt. on medications Normal cardiovascular exam   Echo 06/27/21: EF 60-65%, no RWMA, mild LVH, g1dd, nl RVSF, mild MR, normal AV   Neuro/Psych Depression TIACVA (Sep 2022)    GI/Hepatic Neg liver ROS, hiatal hernia,   Endo/Other  negative endocrine ROSChronic prednisone  Renal/GU negative Renal ROS  negative genitourinary   Musculoskeletal negative musculoskeletal ROS (+)   Abdominal   Peds  Hematology negative hematology ROS (+)   Anesthesia Other Findings  Giant cell arteritis on chronic prednisone  Reproductive/Obstetrics                           Anesthesia Physical Anesthesia Plan  ASA: 4  Anesthesia Plan: General   Post-op Pain Management:    Induction: Intravenous  PONV Risk Score and Plan: 3 and Ondansetron, Dexamethasone, Treatment may vary due to age or medical condition and Midazolam  Airway Management Planned: Oral ETT  Additional Equipment: None  Intra-op Plan:   Post-operative Plan: Extubation in OR  Informed Consent: I have reviewed the patients History and Physical, chart, labs and discussed the procedure including the risks, benefits and alternatives for the proposed anesthesia with the patient or authorized representative who has indicated his/her understanding and acceptance.     Dental advisory given  Plan Discussed with:   Anesthesia Plan Comments: (PAT note by Karoline Caldwell,  PA-C: Recent admission September 2022 for complaint of slurred speech, confusion, right arm weakness.  Symptoms lasted for about 30 minutes.  MRI on admission was positive for acute CVA. No large vessel occlusion were noted.  Echo showed EF 60 to 65%, grade 1 DD, no significant valvular abnormalities. Neurology recommended obtaining EEG, which was negative. LP was performed per neuro on 9/22which was negative but due to concerns of possible autoimmune disease, started on high-dose steroids.  Mentation did slowly improve, per discharge summary, patient remained pleasantly confused throughout admission.  She was not quite at baseline at time of discharge, but family felt comfortable caring for her.  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  Neurology notes indicated that etiology of cognitive change was uncertain.  Her acute delirium on admission was felt multifactorial including hypertensive encephalopathy (SBP 200s initially), day night reversion, hospital delirium, IV steroids, possible underlying degree of cognitive impairment.  She was discharged on ASA and Plavix for 1 month, and after that instructed to continue Plavix alone.  ED visit 07/20/2021 for altered mental status.  Work-up was benign.  CT brain unremarkable.  Felt related at least in part to narcotic use for acute back pain.  Of note, EKG was read as atrial fibrillation.  There is no comment on this in ED notes.  On review, rhythm appears to be sinus with significant artifact.  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  Last seen by Dr. Benjamine Mola 07/24/2021.  At that time it was noted that her Actemra has been discontinued  due to concern for exacerbating her hypertension.  Prednisone was increased back to 20 mg daily due to removal of DMARDs.  She subsequently developed multiple new compression fractures in thoracic vertebra provoked with minimal trauma.  Her mental status was noted  to be worsened with some episodes of hallucination or delirium thought related to pain medication as well as recent hospitalization.  CMP and CBC from 07/20/2021 reviewed, unremarkable.   TTE 06/27/2021: 1. Left ventricular ejection fraction, by estimation, is 60 to 65%. The  left ventricle has normal function. The left ventricle has no regional  wall motion abnormalities. There is mild left ventricular hypertrophy.  Left ventricular diastolic parameters  are consistent with Grade I diastolic dysfunction (impaired relaxation).  2. Right ventricular systolic function is normal. The right ventricular  size is normal. Tricuspid regurgitation signal is inadequate for assessing  PA pressure.  3. The mitral valve is normal in structure. Mild mitral valve  regurgitation. No evidence of mitral stenosis.  4. The aortic valve is tricuspid. Aortic valve regurgitation is not  visualized. No aortic stenosis is present.  )       Anesthesia Quick Evaluation

## 2021-08-09 NOTE — Progress Notes (Signed)
Spoke with pt's husband, Barnabas Lister for pre-op call. DPR on file. He states pt has not had and cardiac history, but has had 2 mini strokes. He states pt is not diabetic. Pt is not on Plavix due to a reaction after starting it.   Will need Covid test on arrival.

## 2021-08-09 NOTE — Progress Notes (Signed)
Anesthesia Chart Review: Same day workup  Recent admission September 2022 for complaint of slurred speech, confusion, right arm weakness.  Symptoms lasted for about 30 minutes.  MRI on admission was positive for acute CVA. No large vessel occlusion were noted.  Echo showed EF 60 to 65%, grade 1 DD, no significant valvular abnormalities.  Neurology recommended obtaining EEG, which was negative. LP was performed per neuro on 9/22 which was negative but due to concerns of possible autoimmune disease, started on high-dose steroids.  Mentation did slowly improve, per discharge summary, patient remained pleasantly confused throughout admission.  She was not quite at baseline at time of discharge, but family felt comfortable caring for her.  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  Neurology notes indicated that etiology of cognitive change was uncertain.  Her acute delirium on admission was felt multifactorial including hypertensive encephalopathy (SBP 200s initially), day night reversion, hospital delirium, IV steroids, possible underlying degree of cognitive impairment.  She was discharged on ASA and Plavix for 1 month, and after that instructed to continue Plavix alone.  ED visit 07/20/2021 for altered mental status.  Work-up was benign.  CT brain unremarkable.  Felt related at least in part to narcotic use for acute back pain.  Of note, EKG was read as atrial fibrillation.  There is no comment on this in ED notes.  On review, rhythm appears to be sinus with baseline artifact  Reviewed tracing with anesthesiologist Dr. Tobias Alexander who also interpreted the tracing as sinus rhythm.  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  Last seen by Dr. Benjamine Mola 07/24/2021.  At that time it was noted that her Actemra has been discontinued due to concern for exacerbating her hypertension.  Prednisone was increased back to 20 mg daily due to removal of DMARDs.  She  subsequently developed multiple new compression fractures in thoracic vertebra provoked with minimal trauma.  Her mental status was noted to be worsened with some episodes of hallucination or delirium thought related to pain medication as well as recent hospitalization.  Patient in intractable pain due to compression fractures, bedridden, has requested home health place Foley catheter due to inability to ambulate independently secondary to pain.  CMP and CBC from 07/20/2021 reviewed, unremarkable.  Patient will need day of surgery evaluation  TTE 06/27/2021:  1. Left ventricular ejection fraction, by estimation, is 60 to 65%. The  left ventricle has normal function. The left ventricle has no regional  wall motion abnormalities. There is mild left ventricular hypertrophy.  Left ventricular diastolic parameters  are consistent with Grade I diastolic dysfunction (impaired relaxation).   2. Right ventricular systolic function is normal. The right ventricular  size is normal. Tricuspid regurgitation signal is inadequate for assessing  PA pressure.   3. The mitral valve is normal in structure. Mild mitral valve  regurgitation. No evidence of mitral stenosis.   4. The aortic valve is tricuspid. Aortic valve regurgitation is not  visualized. No aortic stenosis is present.     Wynonia Musty [facility]/Anesthesiology Phone [phone] 08/09/2021 9:44 AM

## 2021-08-10 ENCOUNTER — Inpatient Hospital Stay (HOSPITAL_COMMUNITY): Payer: Medicare Other | Admitting: Physician Assistant

## 2021-08-10 ENCOUNTER — Encounter (HOSPITAL_COMMUNITY)
Admission: RE | Disposition: A | Discharge status: Home Health | Payer: Self-pay | Source: Home / Self Care | Attending: Neurosurgery

## 2021-08-10 ENCOUNTER — Inpatient Hospital Stay (HOSPITAL_COMMUNITY)
Admission: RE | Admit: 2021-08-10 | Discharge: 2021-08-19 | DRG: 457 | Disposition: A | Discharge status: Home Health | Payer: Medicare Other | Attending: Neurosurgery | Admitting: Neurosurgery

## 2021-08-10 ENCOUNTER — Inpatient Hospital Stay (HOSPITAL_COMMUNITY): Payer: Medicare Other

## 2021-08-10 DIAGNOSIS — R2990 Unspecified symptoms and signs involving the nervous system: Secondary | ICD-10-CM | POA: Diagnosis not present

## 2021-08-10 DIAGNOSIS — Z87891 Personal history of nicotine dependence: Secondary | ICD-10-CM

## 2021-08-10 DIAGNOSIS — S22080A Wedge compression fracture of T11-T12 vertebra, initial encounter for closed fracture: Secondary | ICD-10-CM | POA: Diagnosis not present

## 2021-08-10 DIAGNOSIS — M1990 Unspecified osteoarthritis, unspecified site: Secondary | ICD-10-CM | POA: Diagnosis present

## 2021-08-10 DIAGNOSIS — M4854XA Collapsed vertebra, not elsewhere classified, thoracic region, initial encounter for fracture: Secondary | ICD-10-CM | POA: Diagnosis present

## 2021-08-10 DIAGNOSIS — Z833 Family history of diabetes mellitus: Secondary | ICD-10-CM | POA: Diagnosis not present

## 2021-08-10 DIAGNOSIS — H9190 Unspecified hearing loss, unspecified ear: Secondary | ICD-10-CM | POA: Diagnosis present

## 2021-08-10 DIAGNOSIS — E7800 Pure hypercholesterolemia, unspecified: Secondary | ICD-10-CM | POA: Diagnosis present

## 2021-08-10 DIAGNOSIS — Z8249 Family history of ischemic heart disease and other diseases of the circulatory system: Secondary | ICD-10-CM

## 2021-08-10 DIAGNOSIS — M316 Other giant cell arteritis: Secondary | ICD-10-CM | POA: Diagnosis present

## 2021-08-10 DIAGNOSIS — S22080D Wedge compression fracture of T11-T12 vertebra, subsequent encounter for fracture with routine healing: Secondary | ICD-10-CM | POA: Diagnosis not present

## 2021-08-10 DIAGNOSIS — F32A Depression, unspecified: Secondary | ICD-10-CM | POA: Diagnosis present

## 2021-08-10 DIAGNOSIS — Z20822 Contact with and (suspected) exposure to covid-19: Secondary | ICD-10-CM | POA: Diagnosis present

## 2021-08-10 DIAGNOSIS — Z993 Dependence on wheelchair: Secondary | ICD-10-CM | POA: Diagnosis not present

## 2021-08-10 DIAGNOSIS — R269 Unspecified abnormalities of gait and mobility: Secondary | ICD-10-CM | POA: Diagnosis present

## 2021-08-10 DIAGNOSIS — Z7401 Bed confinement status: Secondary | ICD-10-CM

## 2021-08-10 DIAGNOSIS — D638 Anemia in other chronic diseases classified elsewhere: Secondary | ICD-10-CM | POA: Diagnosis present

## 2021-08-10 DIAGNOSIS — R29810 Facial weakness: Secondary | ICD-10-CM | POA: Diagnosis present

## 2021-08-10 DIAGNOSIS — Z8673 Personal history of transient ischemic attack (TIA), and cerebral infarction without residual deficits: Secondary | ICD-10-CM

## 2021-08-10 DIAGNOSIS — I10 Essential (primary) hypertension: Secondary | ICD-10-CM | POA: Diagnosis present

## 2021-08-10 DIAGNOSIS — Z419 Encounter for procedure for purposes other than remedying health state, unspecified: Secondary | ICD-10-CM

## 2021-08-10 DIAGNOSIS — F05 Delirium due to known physiological condition: Secondary | ICD-10-CM | POA: Diagnosis not present

## 2021-08-10 HISTORY — DX: Cerebral infarction, unspecified: I63.9

## 2021-08-10 HISTORY — PX: SPINAL FUSION: SHX223

## 2021-08-10 HISTORY — DX: Unspecified osteoarthritis, unspecified site: M19.90

## 2021-08-10 HISTORY — DX: Anemia, unspecified: D64.9

## 2021-08-10 LAB — CBC
HCT: 38.3 % (ref 36.0–46.0)
Hemoglobin: 12.6 g/dL (ref 12.0–15.0)
MCH: 30.1 pg (ref 26.0–34.0)
MCHC: 32.9 g/dL (ref 30.0–36.0)
MCV: 91.4 fL (ref 80.0–100.0)
Platelets: 365 10*3/uL (ref 150–400)
RBC: 4.19 MIL/uL (ref 3.87–5.11)
RDW: 13.6 % (ref 11.5–15.5)
WBC: 16.7 10*3/uL — ABNORMAL HIGH (ref 4.0–10.5)
nRBC: 0 % (ref 0.0–0.2)

## 2021-08-10 LAB — CREATININE, SERUM
Creatinine, Ser: 0.5 mg/dL (ref 0.44–1.00)
GFR, Estimated: >60 mL/min (ref >60)

## 2021-08-10 LAB — TYPE AND SCREEN
ABO/RH(D): A POS
Antibody Screen: NEGATIVE

## 2021-08-10 LAB — SARS CORONAVIRUS 2 BY RT PCR (HOSPITAL ORDER, PERFORMED IN ~~LOC~~ HOSPITAL LAB): SARS Coronavirus 2: NEGATIVE

## 2021-08-10 LAB — SURGICAL PCR SCREEN
MRSA, PCR: NEGATIVE
Staphylococcus aureus: POSITIVE — AB

## 2021-08-10 SURGERY — POSTERIOR LUMBAR FUSION 2 LEVEL
Anesthesia: General | Site: Spine Lumbar

## 2021-08-10 MED ORDER — SODIUM CHLORIDE 0.9 % IV SOLN: 250.0000 mL | INTRAVENOUS | Status: DC

## 2021-08-10 MED ORDER — DARIFENACIN HYDROBROMIDE ER 15 MG PO TB24
15.0000 mg | ORAL_TABLET | Freq: Every day | ORAL | Status: DC
  Administered 2021-08-11 – 2021-08-19 (×9): 15 mg via ORAL
  Filled 2021-08-10 (×9): qty 1

## 2021-08-10 MED ORDER — PHENOL 1.4 % MT LIQD: 1.0000 | OROMUCOSAL | Status: DC | PRN

## 2021-08-10 MED ORDER — MAGNESIUM OXIDE -MG SUPPLEMENT 400 (240 MG) MG PO TABS
400.0000 mg | ORAL_TABLET | Freq: Every morning | ORAL | Status: DC
  Administered 2021-08-12 – 2021-08-19 (×8): 400 mg via ORAL
  Filled 2021-08-10 (×10): qty 1

## 2021-08-10 MED ORDER — PHENYLEPHRINE 40 MCG/ML (10ML) SYRINGE FOR IV PUSH (FOR BLOOD PRESSURE SUPPORT)
PREFILLED_SYRINGE | INTRAVENOUS | Status: DC | PRN
  Administered 2021-08-10: 40 ug via INTRAVENOUS

## 2021-08-10 MED ORDER — ONDANSETRON HCL 4 MG PO TABS: 4.0000 mg | ORAL_TABLET | Freq: Four times a day (QID) | ORAL | Status: DC | PRN

## 2021-08-10 MED ORDER — PHENYLEPHRINE HCL-NACL 20-0.9 MG/250ML-% IV SOLN
INTRAVENOUS | Status: DC | PRN
  Administered 2021-08-10: 40 ug/min via INTRAVENOUS

## 2021-08-10 MED ORDER — SUGAMMADEX SODIUM 200 MG/2ML IV SOLN
INTRAVENOUS | Status: DC | PRN
  Administered 2021-08-10: 200 mg via INTRAVENOUS

## 2021-08-10 MED ORDER — OYSTER SHELL CALCIUM/D3 500-5 MG-MCG PO TABS
1.0000 | ORAL_TABLET | Freq: Every day | ORAL | Status: DC
  Administered 2021-08-12 – 2021-08-19 (×8): 1 via ORAL
  Filled 2021-08-10 (×8): qty 1

## 2021-08-10 MED ORDER — BRIMONIDINE TARTRATE-TIMOLOL 0.2-0.5 % OP SOLN
1.0000 [drp] | Freq: Two times a day (BID) | OPHTHALMIC | Status: DC
  Filled 2021-08-10: qty 5

## 2021-08-10 MED ORDER — THROMBIN 5000 UNITS EX SOLR: OROMUCOSAL | Status: DC | PRN

## 2021-08-10 MED ORDER — HYDROCODONE-ACETAMINOPHEN 7.5-325 MG PO TABS: 1.0000 | ORAL_TABLET | Freq: Four times a day (QID) | ORAL | Status: DC

## 2021-08-10 MED ORDER — AMLODIPINE BESYLATE 5 MG PO TABS
5.0000 mg | ORAL_TABLET | Freq: Every day | ORAL | Status: DC
  Administered 2021-08-11 – 2021-08-19 (×9): 5 mg via ORAL
  Filled 2021-08-10 (×9): qty 1

## 2021-08-10 MED ORDER — PREDNISONE 5 MG PO TABS
10.0000 mg | ORAL_TABLET | Freq: Every day | ORAL | Status: DC
  Administered 2021-08-12 – 2021-08-19 (×8): 10 mg via ORAL
  Filled 2021-08-10 (×9): qty 2

## 2021-08-10 MED ORDER — SENNOSIDES-DOCUSATE SODIUM 8.6-50 MG PO TABS: 1.0000 | ORAL_TABLET | Freq: Every evening | ORAL | Status: DC | PRN

## 2021-08-10 MED ORDER — FENTANYL CITRATE (PF) 100 MCG/2ML IJ SOLN
INTRAMUSCULAR | Status: AC
  Filled 2021-08-10: qty 2

## 2021-08-10 MED ORDER — LACTATED RINGERS IV SOLN: INTRAVENOUS | Status: DC

## 2021-08-10 MED ORDER — ONDANSETRON HCL 4 MG/2ML IJ SOLN
4.0000 mg | Freq: Four times a day (QID) | INTRAMUSCULAR | Status: DC | PRN
  Administered 2021-08-11: 4 mg via INTRAVENOUS
  Filled 2021-08-10: qty 2

## 2021-08-10 MED ORDER — VITAMIN D 25 MCG (1000 UNIT) PO TABS
1000.0000 [IU] | ORAL_TABLET | Freq: Every day | ORAL | Status: DC
  Administered 2021-08-11 – 2021-08-18 (×8): 1000 [IU] via ORAL
  Filled 2021-08-10 (×9): qty 1

## 2021-08-10 MED ORDER — LISINOPRIL 10 MG PO TABS
10.0000 mg | ORAL_TABLET | Freq: Every day | ORAL | Status: DC
  Administered 2021-08-11 – 2021-08-19 (×9): 10 mg via ORAL
  Filled 2021-08-10 (×9): qty 1

## 2021-08-10 MED ORDER — LIDOCAINE-EPINEPHRINE 1 %-1:100000 IJ SOLN
INTRAMUSCULAR | Status: AC
  Filled 2021-08-10: qty 1

## 2021-08-10 MED ORDER — SODIUM CHLORIDE 0.9% FLUSH
3.0000 mL | Freq: Two times a day (BID) | INTRAVENOUS | Status: DC
  Administered 2021-08-10 – 2021-08-19 (×18): 3 mL via INTRAVENOUS

## 2021-08-10 MED ORDER — ROCURONIUM BROMIDE 10 MG/ML (PF) SYRINGE
PREFILLED_SYRINGE | INTRAVENOUS | Status: AC
  Filled 2021-08-10: qty 10

## 2021-08-10 MED ORDER — POTASSIUM CHLORIDE IN NACL 20-0.9 MEQ/L-% IV SOLN
INTRAVENOUS | Status: DC
  Filled 2021-08-10: qty 1000

## 2021-08-10 MED ORDER — VITAMIN D2 50 MCG (2000 UT) PO TABS: 1.0000 | ORAL_TABLET | Freq: Every day | ORAL | Status: DC

## 2021-08-10 MED ORDER — ROCURONIUM BROMIDE 10 MG/ML (PF) SYRINGE
PREFILLED_SYRINGE | INTRAVENOUS | Status: DC | PRN
  Administered 2021-08-10: 80 mg via INTRAVENOUS

## 2021-08-10 MED ORDER — HYDRALAZINE HCL 20 MG/ML IJ SOLN
10.0000 mg | Freq: Once | INTRAMUSCULAR | Status: AC
  Administered 2021-08-10: 10 mg via INTRAVENOUS

## 2021-08-10 MED ORDER — ASPIRIN EC 81 MG PO TBEC
81.0000 mg | DELAYED_RELEASE_TABLET | Freq: Every day | ORAL | Status: DC
  Administered 2021-08-11 – 2021-08-19 (×9): 81 mg via ORAL
  Filled 2021-08-10 (×10): qty 1

## 2021-08-10 MED ORDER — MIDAZOLAM HCL 5 MG/5ML IJ SOLN
INTRAMUSCULAR | Status: DC | PRN
  Administered 2021-08-10 (×2): 1 mg via INTRAVENOUS

## 2021-08-10 MED ORDER — MORPHINE SULFATE (PF) 2 MG/ML IV SOLN
1.0000 mg | INTRAVENOUS | Status: DC | PRN
  Administered 2021-08-11 – 2021-08-12 (×2): 1 mg via INTRAVENOUS
  Filled 2021-08-10 (×2): qty 1

## 2021-08-10 MED ORDER — ONDANSETRON HCL 4 MG/2ML IJ SOLN
INTRAMUSCULAR | Status: DC | PRN
  Administered 2021-08-10: 4 mg via INTRAVENOUS

## 2021-08-10 MED ORDER — ORAL CARE MOUTH RINSE: 15.0000 mL | Freq: Once | OROMUCOSAL | Status: AC

## 2021-08-10 MED ORDER — CEFAZOLIN SODIUM-DEXTROSE 2-3 GM-%(50ML) IV SOLR
INTRAVENOUS | Status: DC | PRN
  Administered 2021-08-10: 2 g via INTRAVENOUS

## 2021-08-10 MED ORDER — ATORVASTATIN CALCIUM 40 MG PO TABS
40.0000 mg | ORAL_TABLET | Freq: Every day | ORAL | Status: DC
  Administered 2021-08-11 – 2021-08-19 (×9): 40 mg via ORAL
  Filled 2021-08-10 (×9): qty 1

## 2021-08-10 MED ORDER — BISACODYL 5 MG PO TBEC: 5.0000 mg | DELAYED_RELEASE_TABLET | Freq: Every day | ORAL | Status: DC | PRN

## 2021-08-10 MED ORDER — FENTANYL CITRATE (PF) 250 MCG/5ML IJ SOLN
INTRAMUSCULAR | Status: DC | PRN
Start: 2021-08-10 — End: 2021-08-10
  Administered 2021-08-10 (×3): 50 ug via INTRAVENOUS
  Administered 2021-08-10: 100 ug via INTRAVENOUS
  Administered 2021-08-10: 50 ug via INTRAVENOUS

## 2021-08-10 MED ORDER — PROPOFOL 10 MG/ML IV BOLUS
INTRAVENOUS | Status: AC
  Filled 2021-08-10: qty 20

## 2021-08-10 MED ORDER — CHLORHEXIDINE GLUCONATE CLOTH 2 % EX PADS
6.0000 | MEDICATED_PAD | Freq: Every day | CUTANEOUS | Status: AC
  Administered 2021-08-13 – 2021-08-14 (×2): 6 via TOPICAL

## 2021-08-10 MED ORDER — HEPARIN SODIUM (PORCINE) 5000 UNIT/ML IJ SOLN
5000.0000 [IU] | Freq: Three times a day (TID) | INTRAMUSCULAR | Status: DC
  Administered 2021-08-10 – 2021-08-19 (×26): 5000 [IU] via SUBCUTANEOUS
  Filled 2021-08-10 (×25): qty 1

## 2021-08-10 MED ORDER — DEXAMETHASONE SODIUM PHOSPHATE 10 MG/ML IJ SOLN
INTRAMUSCULAR | Status: AC
  Filled 2021-08-10: qty 1

## 2021-08-10 MED ORDER — LATANOPROST 0.005 % OP SOLN
1.0000 [drp] | Freq: Every day | OPHTHALMIC | Status: DC
  Administered 2021-08-13 – 2021-08-17 (×5): 1 [drp] via OPHTHALMIC
  Filled 2021-08-10: qty 2.5

## 2021-08-10 MED ORDER — ONDANSETRON HCL 4 MG/2ML IJ SOLN
INTRAMUSCULAR | Status: AC
  Filled 2021-08-10: qty 2

## 2021-08-10 MED ORDER — DEXAMETHASONE SODIUM PHOSPHATE 10 MG/ML IJ SOLN
INTRAMUSCULAR | Status: DC | PRN
  Administered 2021-08-10: 4 mg via INTRAVENOUS

## 2021-08-10 MED ORDER — HYDROCODONE-ACETAMINOPHEN 5-325 MG PO TABS
2.0000 | ORAL_TABLET | ORAL | Status: DC | PRN
  Administered 2021-08-10: 2 via ORAL
  Filled 2021-08-10 (×2): qty 2

## 2021-08-10 MED ORDER — TIMOLOL MALEATE 0.5 % OP SOLN
1.0000 [drp] | Freq: Two times a day (BID) | OPHTHALMIC | Status: DC
  Administered 2021-08-11 – 2021-08-19 (×13): 1 [drp] via OPHTHALMIC
  Filled 2021-08-10: qty 5

## 2021-08-10 MED ORDER — KETOROLAC TROMETHAMINE 0.5 % OP SOLN
1.0000 [drp] | Freq: Every morning | OPHTHALMIC | Status: DC
  Administered 2021-08-11 – 2021-08-17 (×7): 1 [drp] via OPHTHALMIC
  Filled 2021-08-10: qty 5

## 2021-08-10 MED ORDER — PHENYLEPHRINE 40 MCG/ML (10ML) SYRINGE FOR IV PUSH (FOR BLOOD PRESSURE SUPPORT)
PREFILLED_SYRINGE | INTRAVENOUS | Status: AC
  Filled 2021-08-10: qty 10

## 2021-08-10 MED ORDER — PROPOFOL 1000 MG/100ML IV EMUL
INTRAVENOUS | Status: AC
  Filled 2021-08-10: qty 600

## 2021-08-10 MED ORDER — ACETAMINOPHEN 650 MG RE SUPP: 650.0000 mg | RECTAL | Status: DC | PRN

## 2021-08-10 MED ORDER — OYSTER SHELL CALCIUM/D3 500-5 MG-MCG PO TABS
ORAL_TABLET | Freq: Every morning | ORAL | Status: DC
  Filled 2021-08-10: qty 1

## 2021-08-10 MED ORDER — FENTANYL CITRATE (PF) 100 MCG/2ML IJ SOLN
25.0000 ug | INTRAMUSCULAR | Status: DC | PRN
  Administered 2021-08-10 (×3): 25 ug via INTRAVENOUS

## 2021-08-10 MED ORDER — HYDRALAZINE HCL 20 MG/ML IJ SOLN
INTRAMUSCULAR | Status: AC
  Filled 2021-08-10: qty 1

## 2021-08-10 MED ORDER — ADULT MULTIVITAMIN W/MINERALS CH
1.0000 | ORAL_TABLET | Freq: Every morning | ORAL | Status: DC
Start: 2021-08-11 — End: 2021-08-19
  Administered 2021-08-12 – 2021-08-19 (×8): 1 via ORAL
  Filled 2021-08-10 (×9): qty 1

## 2021-08-10 MED ORDER — BRIMONIDINE TARTRATE 0.2 % OP SOLN
1.0000 [drp] | Freq: Two times a day (BID) | OPHTHALMIC | Status: DC
  Administered 2021-08-11 – 2021-08-19 (×13): 1 [drp] via OPHTHALMIC
  Filled 2021-08-10: qty 5

## 2021-08-10 MED ORDER — CHLORHEXIDINE GLUCONATE 0.12 % MT SOLN: 15.0000 mL | Freq: Once | OROMUCOSAL | Status: AC

## 2021-08-10 MED ORDER — MIDAZOLAM HCL 2 MG/2ML IJ SOLN
INTRAMUSCULAR | Status: AC
  Filled 2021-08-10: qty 2

## 2021-08-10 MED ORDER — BUPIVACAINE HCL (PF) 0.5 % IJ SOLN
INTRAMUSCULAR | Status: AC
  Filled 2021-08-10: qty 30

## 2021-08-10 MED ORDER — FENTANYL CITRATE (PF) 250 MCG/5ML IJ SOLN
INTRAMUSCULAR | Status: AC
  Filled 2021-08-10: qty 5

## 2021-08-10 MED ORDER — HYDROCODONE-ACETAMINOPHEN 5-325 MG PO TABS
1.0000 | ORAL_TABLET | ORAL | Status: DC | PRN
  Administered 2021-08-11: 1 via ORAL
  Filled 2021-08-10 (×2): qty 1

## 2021-08-10 MED ORDER — LIDOCAINE 2% (20 MG/ML) 5 ML SYRINGE
INTRAMUSCULAR | Status: AC
  Filled 2021-08-10: qty 5

## 2021-08-10 MED ORDER — OXYCODONE HCL 5 MG PO TABS: 5.0000 mg | ORAL_TABLET | Freq: Once | ORAL | Status: DC | PRN

## 2021-08-10 MED ORDER — SODIUM CHLORIDE 0.9% FLUSH: 3.0000 mL | INTRAVENOUS | Status: DC | PRN

## 2021-08-10 MED ORDER — MENTHOL 3 MG MT LOZG
1.0000 | LOZENGE | OROMUCOSAL | Status: DC | PRN
  Filled 2021-08-10: qty 9

## 2021-08-10 MED ORDER — FUROSEMIDE 20 MG PO TABS
10.0000 mg | ORAL_TABLET | Freq: Every morning | ORAL | Status: DC
  Administered 2021-08-12 – 2021-08-19 (×8): 10 mg via ORAL
  Filled 2021-08-10 (×9): qty 1

## 2021-08-10 MED ORDER — FLUOROMETHOLONE 0.1 % OP SUSP
1.0000 [drp] | Freq: Two times a day (BID) | OPHTHALMIC | Status: DC
  Administered 2021-08-11 – 2021-08-19 (×14): 1 [drp] via OPHTHALMIC
  Filled 2021-08-10: qty 5

## 2021-08-10 MED ORDER — 0.9 % SODIUM CHLORIDE (POUR BTL) OPTIME
TOPICAL | Status: DC | PRN
  Administered 2021-08-10: 1000 mL

## 2021-08-10 MED ORDER — MUPIROCIN 2 % EX OINT
1.0000 "application " | TOPICAL_OINTMENT | Freq: Two times a day (BID) | CUTANEOUS | Status: AC
  Administered 2021-08-12 – 2021-08-14 (×5): 1 via NASAL
  Filled 2021-08-10 (×2): qty 22

## 2021-08-10 MED ORDER — OXYCODONE HCL 5 MG/5ML PO SOLN: 5.0000 mg | Freq: Once | ORAL | Status: DC | PRN

## 2021-08-10 MED ORDER — DEXMEDETOMIDINE (PRECEDEX) IN NS 20 MCG/5ML (4 MCG/ML) IV SYRINGE
PREFILLED_SYRINGE | INTRAVENOUS | Status: DC | PRN
  Administered 2021-08-10 (×2): 8 ug via INTRAVENOUS

## 2021-08-10 MED ORDER — BUPIVACAINE HCL (PF) 0.5 % IJ SOLN
INTRAMUSCULAR | Status: DC | PRN
  Administered 2021-08-10: 10 mL

## 2021-08-10 MED ORDER — SENNA 8.6 MG PO TABS
1.0000 | ORAL_TABLET | Freq: Two times a day (BID) | ORAL | Status: DC
  Administered 2021-08-10 – 2021-08-19 (×18): 8.6 mg via ORAL
  Filled 2021-08-10 (×18): qty 1

## 2021-08-10 MED ORDER — THROMBIN 5000 UNITS EX SOLR
CUTANEOUS | Status: AC
  Filled 2021-08-10: qty 5000

## 2021-08-10 MED ORDER — HYDROCODONE-ACETAMINOPHEN 7.5-325 MG PO TABS
1.0000 | ORAL_TABLET | Freq: Four times a day (QID) | ORAL | Status: DC
  Administered 2021-08-12 (×2): 1 via ORAL
  Filled 2021-08-10 (×2): qty 1

## 2021-08-10 MED ORDER — ACETAMINOPHEN 500 MG PO TABS
1000.0000 mg | ORAL_TABLET | Freq: Four times a day (QID) | ORAL | Status: AC
  Administered 2021-08-11 (×4): 1000 mg via ORAL
  Filled 2021-08-10 (×4): qty 2

## 2021-08-10 MED ORDER — CHLORHEXIDINE GLUCONATE 0.12 % MT SOLN
OROMUCOSAL | Status: AC
  Administered 2021-08-10: 15 mL via OROMUCOSAL
  Filled 2021-08-10: qty 15

## 2021-08-10 MED ORDER — ONDANSETRON HCL 4 MG/2ML IJ SOLN: 4.0000 mg | Freq: Once | INTRAMUSCULAR | Status: DC | PRN

## 2021-08-10 MED ORDER — PROPOFOL 10 MG/ML IV BOLUS
INTRAVENOUS | Status: DC | PRN
  Administered 2021-08-10 (×3): 100 mg via INTRAVENOUS

## 2021-08-10 MED ORDER — LIDOCAINE-EPINEPHRINE 0.5 %-1:200000 IJ SOLN
INTRAMUSCULAR | Status: DC | PRN
  Administered 2021-08-10: 20 mL

## 2021-08-10 MED ORDER — ESMOLOL HCL 100 MG/10ML IV SOLN
INTRAVENOUS | Status: AC
  Filled 2021-08-10: qty 10

## 2021-08-10 MED ORDER — DIAZEPAM 2 MG PO TABS: 2.0000 mg | ORAL_TABLET | Freq: Four times a day (QID) | ORAL | Status: DC | PRN

## 2021-08-10 MED ORDER — DORZOLAMIDE HCL 2 % OP SOLN
1.0000 [drp] | Freq: Two times a day (BID) | OPHTHALMIC | Status: DC
  Administered 2021-08-11 – 2021-08-19 (×14): 1 [drp] via OPHTHALMIC
  Filled 2021-08-10: qty 10

## 2021-08-10 MED ORDER — PHENYLEPHRINE HCL-NACL 20-0.9 MG/250ML-% IV SOLN
INTRAVENOUS | Status: AC
  Filled 2021-08-10: qty 500

## 2021-08-10 MED ORDER — FERROUS SULFATE 325 (65 FE) MG PO TABS
325.0000 mg | ORAL_TABLET | ORAL | Status: DC
  Administered 2021-08-13 – 2021-08-17 (×3): 325 mg via ORAL
  Filled 2021-08-10: qty 1

## 2021-08-10 MED ORDER — FLEET ENEMA 7-19 GM/118ML RE ENEM: 1.0000 | ENEMA | Freq: Once | RECTAL | Status: DC | PRN

## 2021-08-10 MED ORDER — ESCITALOPRAM OXALATE 10 MG PO TABS
20.0000 mg | ORAL_TABLET | Freq: Every day | ORAL | Status: DC
  Administered 2021-08-11 – 2021-08-19 (×9): 20 mg via ORAL
  Filled 2021-08-10 (×9): qty 2

## 2021-08-10 MED ORDER — ACETAMINOPHEN 325 MG PO TABS
650.0000 mg | ORAL_TABLET | ORAL | Status: DC | PRN
  Administered 2021-08-14 – 2021-08-19 (×7): 650 mg via ORAL
  Filled 2021-08-10 (×7): qty 2

## 2021-08-10 MED ORDER — LIDOCAINE 2% (20 MG/ML) 5 ML SYRINGE
INTRAMUSCULAR | Status: DC | PRN
  Administered 2021-08-10: 80 mg via INTRAVENOUS

## 2021-08-10 SURGICAL SUPPLY — 74 items
ADH SKN CLS APL DERMABOND .7 (GAUZE/BANDAGES/DRESSINGS) ×1
APL SKNCLS STERI-STRIP NONHPOA (GAUZE/BANDAGES/DRESSINGS)
BAG COUNTER SPONGE SURGICOUNT (BAG) ×2 IMPLANT
BAG SPNG CNTER NS LX DISP (BAG) ×1
BENZOIN TINCTURE PRP APPL 2/3 (GAUZE/BANDAGES/DRESSINGS) IMPLANT
BLADE CLIPPER SURG (BLADE) IMPLANT
BUR MATCHSTICK NEURO 3.0 LAGG (BURR) ×1 IMPLANT
BUR PRECISION FLUTE 5.0 (BURR) ×1 IMPLANT
CANISTER SUCT 3000ML PPV (MISCELLANEOUS) ×2 IMPLANT
CARTRIDGE OIL MAESTRO DRILL (MISCELLANEOUS) ×1 IMPLANT
CEMENT KYPHON C01A KIT/MIXER (Cement) ×1 IMPLANT
CNTNR URN SCR LID CUP LEK RST (MISCELLANEOUS) ×1 IMPLANT
CONT SPEC 4OZ STRL OR WHT (MISCELLANEOUS) ×2
COVER BACK TABLE 60X90IN (DRAPES) ×1 IMPLANT
DECANTER SPIKE VIAL GLASS SM (MISCELLANEOUS) ×1 IMPLANT
DERMABOND ADVANCED (GAUZE/BANDAGES/DRESSINGS) ×1
DERMABOND ADVANCED .7 DNX12 (GAUZE/BANDAGES/DRESSINGS) ×1 IMPLANT
DIFFUSER DRILL AIR PNEUMATIC (MISCELLANEOUS) ×2 IMPLANT
DRAPE C-ARM 42X72 X-RAY (DRAPES) ×4 IMPLANT
DRAPE C-ARMOR (DRAPES) ×1 IMPLANT
DRAPE LAPAROTOMY 100X72X124 (DRAPES) ×2 IMPLANT
DRAPE SURG 17X23 STRL (DRAPES) ×2 IMPLANT
DURAPREP 26ML APPLICATOR (WOUND CARE) ×2 IMPLANT
ELECT REM PT RETURN 9FT ADLT (ELECTROSURGICAL) ×2
ELECTRODE REM PT RTRN 9FT ADLT (ELECTROSURGICAL) ×1 IMPLANT
GAUZE 4X4 16PLY ~~LOC~~+RFID DBL (SPONGE) IMPLANT
GAUZE SPONGE 4X4 12PLY STRL (GAUZE/BANDAGES/DRESSINGS) IMPLANT
GLOVE EXAM NITRILE XL STR (GLOVE) IMPLANT
GLOVE SURG LTX SZ6.5 (GLOVE) ×4 IMPLANT
GLOVE SURG POLYISO LF SZ7.5 (GLOVE) ×4 IMPLANT
GLOVE SURG UNDER LTX SZ7.5 (GLOVE) ×6 IMPLANT
GOWN STRL REUS W/ TWL LRG LVL3 (GOWN DISPOSABLE) ×2 IMPLANT
GOWN STRL REUS W/ TWL XL LVL3 (GOWN DISPOSABLE) IMPLANT
GOWN STRL REUS W/TWL 2XL LVL3 (GOWN DISPOSABLE) IMPLANT
GOWN STRL REUS W/TWL LRG LVL3 (GOWN DISPOSABLE) ×10
GOWN STRL REUS W/TWL XL LVL3 (GOWN DISPOSABLE)
GRAFT BN 5X1XSPNE CVD POST DBM (Bone Implant) IMPLANT
GRAFT BONE MAGNIFUSE 1X5CM (Bone Implant) ×2 IMPLANT
GUIDEWIRE NITINOL BEVEL TIP (WIRE) ×8 IMPLANT
HEMOSTAT POWDER KIT SURGIFOAM (HEMOSTASIS) ×1 IMPLANT
KIT BASIN OR (CUSTOM PROCEDURE TRAY) ×2 IMPLANT
KIT POSITION SURG JACKSON T1 (MISCELLANEOUS) ×2 IMPLANT
KIT TURNOVER KIT B (KITS) ×2 IMPLANT
MILL MEDIUM DISP (BLADE) IMPLANT
NDL HYPO 25X1 1.5 SAFETY (NEEDLE) ×1 IMPLANT
NDL I-PASS III (NEEDLE) IMPLANT
NDL SPNL 18GX3.5 QUINCKE PK (NEEDLE) IMPLANT
NEEDLE HYPO 25X1 1.5 SAFETY (NEEDLE) ×2 IMPLANT
NEEDLE I-PASS III (NEEDLE) ×2 IMPLANT
NEEDLE SPNL 18GX3.5 QUINCKE PK (NEEDLE) IMPLANT
NS IRRIG 1000ML POUR BTL (IV SOLUTION) ×2 IMPLANT
OIL CARTRIDGE MAESTRO DRILL (MISCELLANEOUS) ×2
PACK LAMINECTOMY NEURO (CUSTOM PROCEDURE TRAY) ×2 IMPLANT
PAD ARMBOARD 7.5X6 YLW CONV (MISCELLANEOUS) ×9 IMPLANT
PUSHER RELINE FENS MAS (MISCELLANEOUS) ×8 IMPLANT
RELINE MAS NDL FENS (NEEDLE) IMPLANT
RELINE MAS NEEDLE FENS (NEEDLE) ×16 IMPLANT
ROD RELINE MAS LORD 5.5X120MM (Rod) ×2 IMPLANT
SCREW LOCK RELINE 5.5 TULIP (Screw) ×7 IMPLANT
SCREW RELINE PA 6.5X40 (Screw) ×8 IMPLANT
SPONGE SURGIFOAM ABS GEL 100 (HEMOSTASIS) ×1 IMPLANT
SPONGE T-LAP 4X18 ~~LOC~~+RFID (SPONGE) IMPLANT
STRIP CLOSURE SKIN 1/2X4 (GAUZE/BANDAGES/DRESSINGS) IMPLANT
SUT ETHILON 3 0 FSL (SUTURE) ×1 IMPLANT
SUT PROLENE 6 0 BV (SUTURE) IMPLANT
SUT VIC AB 0 CT1 18XCR BRD8 (SUTURE) ×1 IMPLANT
SUT VIC AB 0 CT1 8-18 (SUTURE) ×2
SUT VIC AB 2-0 CT1 18 (SUTURE) ×3 IMPLANT
SUT VIC AB 3-0 SH 8-18 (SUTURE) ×4 IMPLANT
TIP FENS REPLACE RELINE (MISCELLANEOUS) ×8 IMPLANT
TOWEL GREEN STERILE (TOWEL DISPOSABLE) ×2 IMPLANT
TOWEL GREEN STERILE FF (TOWEL DISPOSABLE) ×2 IMPLANT
TUBE CONNECTING 12X1/4 (SUCTIONS) ×2 IMPLANT
WATER STERILE IRR 1000ML POUR (IV SOLUTION) ×2 IMPLANT

## 2021-08-10 NOTE — Op Note (Signed)
08/10/2021  6:15 PM  PATIENT:  Mckenzie [name]  75 y.o. female with intractable pain due to non healing T12 fracture  PRE-OPERATIVE DIAGNOSIS:  Thoracic twelve Compression fracture, thoracic eleven compression fracture  POST-OPERATIVE DIAGNOSIS:  Thoracic twelve Compression fracture, thoracic eleven compression fracture  PROCEDURE:  Procedure(s): Thoracic ten-Lumbar 2 Open reduction internal fixation t12 fracture Posterior lateral fusion t12-L2 posterolateral arthrodesis with allograft morsels  SURGEON: Surgeon(s): Ashok Pall, MD Earnie Larsson, MD  ASSISTANTS:Pool, Mallie Mussel  ANESTHESIA:   general  EBL:  Total I/O In: 1500 [I.V.:1500] Out: 575 [Urine:400; Blood:175]  BLOOD ADMINISTERED:none  CELL SAVER GIVEN:none  COUNT:per nursing  DRAINS: none   SPECIMEN:  No Specimen  DICTATION: Mckenzie [name] was taken to the operating room, intubated, and placed under a general anesthetic without difficulty. She was positioned prone on the [place] table with all pressure points padded. She was prepped and draped in a sterile manner. With fluoroscopy I identified and cannulated the T11, 10, Lumbar 1, and 2 pedicles.. I used Jamshidi needles placing them in the pedicles, and K-wires passed through them. The left L1 pedicle was initially medial, then too lateral. I removed the screw and did not try to replace it.  I placed 6.44mm x21mm screws in T10,11,L1, and 2. I injected methylmethacrylate into the fenestrated Nuvasive screws. With Dr.Pool's assistance the rods were passed through the towers and secured with locking caps. The final fluoroscopy films showed the screws in good position, along with the rods.  I then exposed the lamina of L1 and T12, and observed some spinal fluid from the aborted Left L1 screw. I placed the allograft over the lamina, then closed the midline incision. The other incisions were closed. I did infiltrate each screw site with lidocaine. I placed a sterile dressing.  She was awakened then extubated.  PLAN OF CARE: Admit to inpatient   PATIENT DISPOSITION:  PACU - hemodynamically stable.   Delay start of Pharmacological VTE agent (>24hrs) due to surgical blood loss or risk of bleeding:  no

## 2021-08-10 NOTE — Progress Notes (Signed)
Orthopedic Tech Progress Note Patient Details:  Mckenzie [name] 03/29/1946 [id number]  Ortho Devices Type of Ortho Device: Lumbar corsett Ortho Device/Splint Interventions: Ordered  I dropped off back brace to patient.    Mckenzie [name] 08/10/2021, 4:50 PM

## 2021-08-10 NOTE — Anesthesia Procedure Notes (Signed)
Procedure Name: Intubation Date/Time: 08/10/2021 11:05 AM Performed by: Geraldine Contras, CRNA Pre-anesthesia Checklist: Patient identified, Patient being monitored, Timeout performed, Emergency Drugs available and Suction available Patient Re-evaluated:Patient Re-evaluated prior to induction Oxygen Delivery Method: Circle system utilized Preoxygenation: Pre-oxygenation with 100% oxygen Induction Type: IV induction Ventilation: Mask ventilation without difficulty Laryngoscope Size: Mac and 3 Grade View: Grade I Tube type: Oral Tube size: 7.0 mm Number of attempts: 1 Airway Equipment and Method: Stylet Placement Confirmation: ETT inserted through vocal cords under direct vision, positive ETCO2 and breath sounds checked- equal and bilateral Secured at: 21 cm Tube secured with: Tape Dental Injury: Teeth and Oropharynx as per pre-operative assessment

## 2021-08-10 NOTE — H&P (Signed)
BP (!) 195/75   Pulse 71   Temp 97.9 F (36.6 C) (Oral)   Resp 18   Ht 5\' 5"  (1.651 m)   Wt 59 kg   SpO2 96%   BMI 21.63 kg/m  Mckenzie [name] came in today for evaluation of pain that she has in her back.  She has sustained, since March of this year, four compression fractures in the thoracic spine; T1, T4, T12, and T11.  She is sent to me specifically for what was thought to be a newly found fracture at T12, but I must say the x-ray that was done to diagnose this approximately 2 weeks ago did not extend to T11.  The T11 is now fractured, also.  It could have happened in the last 2 weeks or it may have just been there, but unseen.     VITAL SIGNS :  Her temperature is 97.8.  She is 5 feet 5 inches.  Blood pressure is 159/69.  Pulse is 69.  Pain is 10/10.  She finds it very difficult to stand.     SOCIAL HISTORY :  She is 75 years of age, right-handed, retired.  She does not smoke.  She did not use alcohol.  No history of substance abuse.     PAST MEDICAL HISTORY :  She has a history of hypertension.  Cerebrovascular attack, which was on 09/22, at least that is when proof was obtained.  She also has giant cell arteritis.     PHYSICAL EXAMINATION :  Today, she is in significant pain.  She has 3+ reflexes at the knees and ankles.  She is in a wheelchair.  I did help her stand, along with her husband, so she could take the x-rays, but this is not something she could do on her own.     ASSESSMENT AND PLAN :  This new back pain is now 2 months in age.  She just went to the emergency room on October 14, and she has been to the emergency room since August approximately 4 times.  On the 14th, she went secondary to confusion.  It was felt that the narcotics prescribed secondary to the pain from the compression fractures might be causing it.  She also has open-angle glaucoma, for which she went to [facility] and underwent a procedure.  She is taking Prednisone 20 mg a day for the giant cell  arteritis and this was instituted in March.  She sees her rheumatologist tomorrow.  The question I am tasked with is what can be done for these compression fractures at T11 and T12.  I think the fracture is slightly worse at T12 today compared to the film done on the 11th and obviously done approximately 2 weeks ago when T11 was never seen.  It is difficult at this point to state what I can do because putting somebody to sleep after a completed infarct less than a month out is risky.  She keeps having these compression fractures, so the utility of the steroid has to be questioned and if it is something that she has to have due to the giant cell arteritis, she does.  But, she does not have but so many thoracic vertebra left that she has now fractured fully one-third of them.  She has good movement in the wheelchair, but the pain prevents her from being able to stand.  I am going to prescribe a brace, as she did not have one.  Therapy is not going to help  in this case.  I think T12 is too far gone for a kyphoplasty to be of any real use.  Secondly, it still requires her to go to sleep.  R15 certainly could be kyphoplastied, but it just puts T10 at risk.  I could certainly put in percutaneous screws and see what we can do to make this fracture hold, and that could be done any number of ways.  I could use fenestrated screws in T11 and inject methylmethacrylate into the bone just as easily as with the kyphoplasty balloon.  It would also give me some screws in T11 because I have to skip T12 and maybe T10, T11, and L1 would be sufficient at this point to protect her, but there is no optimal plan of treatment at this point. I will however proceed with percutaneous pedicle screws at T10-L2 using methylmethacrylate and fenestrated screws to augment the fixation.  Risks and benefits including but not limited to infection, bleeding, fusion failure, hardware failure, no pain relief, no symptomatic improvement, need for further  surgery, damage to the spinal cord, weakness in one or both lower extremities, bowel and or bladder dysfunction, paralysis, and other risks. The family and Mrs. Gomer understand and wish to proceed.

## 2021-08-10 NOTE — Transfer of Care (Signed)
Immediate Anesthesia Transfer of Care Note  Patient: Mckenzie [name]  Procedure(s) Performed: Thoracic ten-elevene, Lumbar one-two Posterior lateral fusion (Spine Lumbar)  Patient Location: PACU  Anesthesia Type:General  Level of Consciousness: sedated  Airway & Oxygen Therapy: Patient Spontanous Breathing  Post-op Assessment: Report given to RN  Post vital signs: stable  Last Vitals:  Vitals Value Taken Time  BP 108/58 08/10/21 1509  Temp    Pulse 70 08/10/21 1514  Resp 10 08/10/21 1514  SpO2 96 % 08/10/21 1514  Vitals shown include unvalidated device data.  Last Pain:  Vitals:   08/10/21 0725  TempSrc:   PainSc: 8       Patients Stated Pain Goal: 2 (53/[id number])  Complications: No notable events documented.

## 2021-08-11 MED ORDER — LABETALOL HCL 5 MG/ML IV SOLN
10.0000 mg | INTRAVENOUS | Status: DC | PRN
  Administered 2021-08-11 – 2021-08-19 (×7): 10 mg via INTRAVENOUS
  Filled 2021-08-11 (×7): qty 4

## 2021-08-11 NOTE — Plan of Care (Signed)
Pt is alert x 1. Pt had back surgery, honeycomb dressing intact. Pt c/o pain 10/10 to back (unable to describe the pain), prn norco given, then scheduled tylenol, not effective, morphine 1 mg given. Morphine effective pt resting with eyes closed. Pt c/o nausea x 1, zofran given with effective results. Pt BP was elevated, on call provider called and received PRN order elevated BP. Indwelling foley catheter was removed, pt has not urinated on her own, despite trying to. Bladder scan completed 261 mls, I&O cath completed 368mls output. Pts daughter present at bedside. Pts husband is requesting for her to use their home medications vs hospital due to insurance concerns.    Problem: Education: Goal: Knowledge of General Education information will improve Description: Including pain rating scale, medication(s)/side effects and non-pharmacologic comfort measures Outcome: Progressing   Problem: Health Behavior/Discharge Planning: Goal: Ability to manage health-related needs will improve Outcome: Progressing   Problem: Clinical Measurements: Goal: Ability to maintain clinical measurements within normal limits will improve Outcome: Progressing Goal: Will remain free from infection Outcome: Progressing Goal: Diagnostic test results will improve Outcome: Progressing Goal: Respiratory complications will improve Outcome: Progressing Goal: Cardiovascular complication will be avoided Outcome: Progressing   Problem: Activity: Goal: Risk for activity intolerance will decrease Outcome: Progressing   Problem: Coping: Goal: Level of anxiety will decrease Outcome: Progressing   Problem: Nutrition: Goal: Adequate nutrition will be maintained Outcome: Progressing   Problem: Elimination: Goal: Will not experience complications related to bowel motility Outcome: Progressing Goal: Will not experience complications related to urinary retention Outcome: Progressing   Problem: Coping: Goal: Level of  anxiety will decrease Outcome: Progressing   Problem: Pain Managment: Goal: General experience of comfort will improve Outcome: Progressing   Problem: Elimination: Goal: Will not experience complications related to bowel motility Outcome: Progressing Goal: Will not experience complications related to urinary retention Outcome: Progressing   Problem: Pain Managment: Goal: General experience of comfort will improve Outcome: Progressing   Problem: Safety: Goal: Ability to remain free from injury will improve Outcome: Progressing   Problem: Skin Integrity: Goal: Risk for impaired skin integrity will decrease Outcome: Progressing

## 2021-08-11 NOTE — Evaluation (Signed)
Occupational Therapy Evaluation Patient Details Name: Mckenzie BLANSETT MRN: [id number] DOB: May 28, 1946 Today's Date: 08/11/2021   History of Present Illness Pt is a 75 y/o female who presents with intractable pain due to nonhealing T12 fracture. She underwent ORIF T12 fracture with posterior lateral fusion T10-L2 on 08/10/2021. She has sustained, since March of this year< 4 compression fractures in the thoracic spine; T1, T4, T11, T12. 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   Clinical Impression   Pt admitted with the above diagnoses and presents with below problem list. Pt will benefit from continued acute OT to address the below listed deficits and maximize independence with basic ADLs prior to d/c to venue below. Recently pt has been needing assistance with toilet transfers to [facility], bathing/dressing, and bed mobility. Pt lives with spouse who is her primary caregiver and only available assistance most of the time for transfers. Pt has adult children that live nearby who work. Today pt needed mod A +2 to take pivot steps to recliner. Pt appears confused, tearful, and anxious/fearful. Spouse reports pt was having hallucinations at home which he attributed to pain med. Pt noted to talk in worried tones about a baby. Consider delirium precautions? Pt and spouse would benefit from further rehab to work on transfer training for eventual return home.        Recommendations for follow up therapy are one component of a multi-disciplinary discharge planning process, led by the attending physician.  Recommendations may be updated based on patient status, additional functional criteria and insurance authorization.   Follow Up Recommendations  Acute inpatient rehab (3hours/day)    Assistance Recommended at Discharge Frequent or constant Supervision/Assistance  Functional Status Assessment  Patient has had a recent decline in their functional  status and demonstrates the ability to make significant improvements in function in a reasonable and predictable amount of time.  Equipment Recommendations  Other (comment) (TBD)    Recommendations for Other Services Rehab consult     Precautions / Restrictions Precautions Precautions: Fall;Back Precaution Booklet Issued: No Precaution Comments: verbally reviewed back precautions and pt was cued for precautions during functional mobility. Required Braces or Orthoses: Spinal Brace Spinal Brace: Lumbar corset;Applied in sitting position Restrictions Weight Bearing Restrictions: No      Mobility Bed Mobility Overal bed mobility: Needs Assistance Bed Mobility: Rolling;Sidelying to Sit Rolling: Mod assist;+2 for physical assistance Sidelying to sit: Max assist;+2 for physical assistance       General bed mobility comments: Assist for all aspects of bed mobility. Increased cues for railing use. Bed pad utilized for full roll to prepare to sit.    Transfers Overall transfer level: Needs assistance Equipment used: Rolling walker (2 wheels) Transfers: Sit to/from [facility] Sit to Stand: Min assist;+2 physical assistance Stand pivot transfers: Mod assist;+2 physical assistance         General transfer comment: +2 assist for power-up to full stand, and increased assist for walker management during pivotal steps around to the chair. Pt with increased difficulty advancing RLE      Balance Overall balance assessment: Needs assistance;History of Falls Sitting-balance support: Bilateral upper extremity supported;Feet supported;Single extremity supported Sitting balance-Leahy Scale: Poor Sitting balance - Comments: fair-poor. mostly sits with BUE. with encouragement, can sit long enough to wash face with single UE support.   Standing balance support: Bilateral upper extremity supported;Reliant on assistive device for balance Standing balance-Leahy Scale:  Poor Standing balance comment: rw  and mod A to stand                           ADL either performed or assessed with clinical judgement   ADL Overall ADL's : Needs assistance/impaired Eating/Feeding: Set up;Sitting   Grooming: Minimal assistance;Sitting Grooming Details (indicate cue type and reason): able to wash face with setup. Min A for full grooming session d/t decreased activity tolerance Upper Body Bathing: Moderate assistance;Sitting   Lower Body Bathing: Moderate assistance;Sit to/from stand;+2 for physical assistance   Upper Body Dressing : Maximal assistance   Lower Body Dressing: Moderate assistance;+2 for physical assistance;Sit to/from stand   Toilet Transfer: Moderate assistance;+2 for physical assistance;Stand-pivot;BSC;Rolling walker (2 wheels)   Toileting- Clothing Manipulation and Hygiene: Moderate assistance;+2 for physical assistance;Sit to/from stand         General ADL Comments: Pt completed bed mobility, sat EOB a few minutes, washed face with setup. Pt then stood and took pivotal steps to sit up in recliner. +2 assist with transfers. Spouse present, daughter on speakerphone at end of session.     Vision         Perception     Praxis      Pertinent Vitals/Pain Pain Assessment: Faces Faces Pain Scale: Hurts even more Pain Location: Back Pain Descriptors / Indicators: Operative site guarding;Sore Pain Intervention(s): Limited activity within patient's tolerance;Monitored during session;Repositioned     Hand Dominance Right   Extremity/Trunk Assessment Upper Extremity Assessment Upper Extremity Assessment: Defer to OT evaluation   Lower Extremity Assessment Lower Extremity Assessment: Generalized weakness;RLE deficits/detail RLE Deficits / Details: Generalized weakness bilaterally, however RLE appears weaker than L. Pt with increased difficulty advancing RLE during OOB mobility.   Cervical / Trunk Assessment Cervical / Trunk  Assessment: Kyphotic;Back Surgery   Communication Communication Communication: HOH   Cognition Arousal/Alertness: Awake/alert Behavior During Therapy: Flat affect Overall Cognitive Status: Impaired/Different from baseline Area of Impairment: Attention;Memory;Following commands;Safety/judgement;Awareness;Problem solving                 Orientation Level: Place;Time;Situation Current Attention Level: Sustained Memory: Decreased recall of precautions Following Commands: Follows one step commands inconsistently;Follows one step commands with increased time;Follows multi-step commands inconsistently Safety/Judgement: Decreased awareness of safety Awareness: Emergent Problem Solving: Slow processing;Difficulty sequencing;Requires verbal cues General Comments: Husband reports pt has been hallucinating and reports concern that the pain medications have been causing this change in her cognition. Encouraged husband to discuss concerns with MD.     General Comments       Exercises     Shoulder Instructions      Home Living Family/patient expects to be discharged to:: Private residence Living Arrangements: Spouse/significant other Available Help at Discharge: Family;Available 24 hours/day (Husband) Type of Home: House Home Access: Ramped entrance     Home Layout: One level     Bathroom Shower/Tub: Teacher, early years/pre: Handicapped height Bathroom Accessibility: Yes (Husband reprots it being a tight space)   Home Equipment: Conservation officer, nature (2 wheels);BSC;Shower seat;Grab bars - tub/shower   Additional Comments: Husband ordered and installed a grab bar at tub that is also within reach of toilet to assist with transfers      Prior Functioning/Environment Prior Level of Function : Needs assist       Physical Assist : Mobility (physical);ADLs (physical) Mobility (physical): Bed mobility;Transfers;Gait ADLs (physical): Bathing;Dressing;Toileting Mobility  Comments: Husband reports pt at a transfers only level at home and has not been ambulating  ADLs Comments: Husband assisting with bathing and dressing. She gets on the [facility] with a SPT with the RW and he assists from there for bathing.        OT Problem List: Decreased strength;Decreased activity tolerance;Impaired balance (sitting and/or standing);Decreased cognition;Decreased knowledge of use of DME or AE;Decreased knowledge of precautions;Pain      OT Treatment/Interventions: Self-care/ADL training;DME and/or AE instruction;Therapeutic activities;Cognitive remediation/compensation;Patient/family education;Balance training    OT Goals(Current goals can be found in the care plan section) Acute Rehab OT Goals Patient Stated Goal: pt: feel better. family: work on transfers including caregiver training OT Goal Formulation: With patient/family Time For Goal Achievement: 08/25/21 Potential to Achieve Goals: Good ADL Goals Pt Will Perform Grooming: with set-up;sitting;with supervision Pt Will Perform Upper Body Dressing: with min assist;sitting Pt Will Perform Lower Body Dressing: sit to/from stand;with mod assist Pt Will Transfer to Toilet: with mod assist;stand pivot transfer;bedside commode Pt Will Perform Toileting - Clothing Manipulation and hygiene: with mod assist;sitting/lateral leans;sit to/from stand Additional ADL Goal #1: Pt will complete bed mobility at min A level to prepare for EOB/OOB ADLs.  OT Frequency: Min 2X/week   Barriers to D/C:    spouse is only person available to assist with transfers most of the time.       Co-evaluation PT/OT/SLP Co-Evaluation/Treatment: Yes Reason for Co-Treatment: Complexity of the patient's impairments (multi-system involvement);Necessary to address cognition/behavior during functional activity;For patient/therapist safety;To address functional/ADL transfers PT goals addressed during session: Mobility/safety with mobility;Balance;Proper use of  DME OT goals addressed during session: ADL's and self-care;Proper use of Adaptive equipment and DME      AM-PAC OT "6 Clicks" Daily Activity     Outcome Measure Help from another person eating meals?: A Little Help from another person taking care of personal grooming?: A Little Help from another person toileting, which includes using toliet, bedpan, or urinal?: A Lot Help from another person bathing (including washing, rinsing, drying)?: A Lot Help from another person to put on and taking off regular upper body clothing?: A Lot Help from another person to put on and taking off regular lower body clothing?: A Lot 6 Click Score: 14   End of Session Equipment Utilized During Treatment: Rolling walker (2 wheels);Gait belt;Back brace Nurse Communication: Mobility status;Need for lift equipment;Precautions;Other (comment) (cognition, consider delirium precautions)  Activity Tolerance: Patient limited by pain;Treatment limited secondary to agitation Patient left: in chair;with call bell/phone within reach;with chair alarm set;with family/visitor present  OT Visit Diagnosis: Unsteadiness on feet (R26.81);Other abnormalities of gait and mobility (R26.89);Muscle weakness (generalized) (M62.81);History of falling (Z91.81);Other symptoms and signs involving cognitive function;Pain;Other symptoms and signs involving the nervous system (R29.898)                Time: 0511-0211 OT Time Calculation (min): 52 min Charges:  OT General Charges $OT Visit: 1 Visit OT Evaluation $OT Eval Moderate Complexity: 1 Mod OT Treatments $Self Care/Home Management : 8-22 mins  Tyrone Schimke, OT Acute Rehabilitation Services Pager: [phone] Office: [phone]   Hortencia Pilar 08/11/2021, 2:46 PM

## 2021-08-11 NOTE — Progress Notes (Signed)
   Providing Compassionate, Quality Care - Together   Subjective: Patient reports she just received pain medication and her back pain is better. Her daughter is at the bedside.  Objective: Vital signs in last 24 hours: Temp:  [97.7 F (36.5 C)-98.6 F (37 C)] 98.5 F (36.9 C) (11/05 0805) Pulse Rate:  [70-102] 88 (11/05 0805) Resp:  [9-22] 18 (11/05 0805) BP: (108-189)/(52-95) 187/82 (11/05 0805) SpO2:  [93 %-100 %] 97 % (11/05 0805) Arterial Line BP: (135-224)/(54-139) 224/90 (11/04 1541)  Intake/Output from previous day: 11/04 0701 - 11/05 0700 In: 2697.7 [P.O.:480; I.V.:2217.7] Out: 2525 [Urine:2350; Blood:175] Intake/Output this shift: No intake/output data recorded.  Alert, pleasantly confused Oriented to self, place, and situation PERRLA MAE, Strength and sensation intact Incision is covered with Honeycomb dressing; Dressing is clean, dry, and intact    Lab Results: Recent Labs    08/10/21 2141  WBC 16.7*  HGB 12.6  HCT 38.3  PLT 365   BMET Recent Labs    08/10/21 2141  CREATININE 0.50    Studies/Results: DG THORACOLUMABAR SPINE  Result Date: 08/10/2021 CLINICAL DATA:  T10-T11, L1-L2 fusion. EXAM: THORACOLUMBAR SPINE 1V COMPARISON:  Lumbar spine x-ray 07/10/2021. CT thoracic spine 05/15/2021. FINDINGS: Intraoperative thoracolumbar spine fusion. One low resolution intraoperative spot views of the thoracolumbar spine were obtained. Thoracolumbar spine fusion hardware present. Drainage catheter overlies the image. Total fluoroscopy time: 3 minutes 45 seconds IMPRESSION: Intraoperative thoracolumbar spine fusion. Electronically Signed   By: Ronney Asters M.D.   On: 08/10/2021 15:12   DG C-Arm 1-60 Min-No Report  Result Date: 08/10/2021 Fluoroscopy was utilized by the requesting physician.  No radiographic interpretation.   DG C-Arm 1-60 Min-No Report  Result Date: 08/10/2021 Fluoroscopy was utilized by the requesting physician.  No radiographic  interpretation.   DG C-Arm 1-60 Min-No Report  Result Date: 08/10/2021 Fluoroscopy was utilized by the requesting physician.  No radiographic interpretation.    Assessment/Plan: Patient had fhad intractable pain due to nonhealing T12 fracture.  She underwent open reduction internal fixation T12, with posterior lateral fusion T10-L2, posterolateral arthrodesis by Dr. Christella Noa on 08/10/2021.   LOS: 1 day   -Mobilize in brace with therapies today -Discharge pending patient's progress with therapies   Viona Gilmore, DNP, AGNP-C Nurse Practitioner  [facility] [place]. [address] [place], [place],  [address] P: [phone]    F: [phone]  08/11/2021, 10:04 AM

## 2021-08-11 NOTE — Progress Notes (Signed)
Per pts daughter present in room, husband stated he does not want any home medications to be given here. He will provide the medicines. He stated that he reached out to medicare and they told him he was allowed to do so. He stated that he needed to provide the medicines because they would not be covered. Daughter informed of medication policy. Pharmacy has also been called to inform. Pts husband will be in today to further explain concerns regarding medication administration.

## 2021-08-11 NOTE — Progress Notes (Signed)
Pt's blood pressure greater than 014 systolic, provider paged to inform. pt resting with eyes closed.

## 2021-08-11 NOTE — Evaluation (Signed)
Physical Therapy Evaluation Patient Details Name: Mckenzie [name] MRN: [id number] DOB: 20-May-1946 Today's Date: 08/11/2021  History of Present Illness  Pt is a 75 y/o female who presents with intractable pain due to nonhealing T12 fracture. She underwent ORIF T12 fracture with posterior lateral fusion T10-L2 on 08/10/2021. She has sustained, since March of this year< 4 compression fractures in the thoracic spine; T1, T4, T11, T12. 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   Clinical Impression  Pt admitted with above diagnosis. At the time of PT eval, pt was able to demonstrate bed mobility and transfers with up to +2 max assist and RW for support. Pt and husband were educated on precautions, brace application/wearing schedule, and positioning recommendations. Discussed rehab options with husband and daughter (on the phone). Family is adamant that pt will not go to a SNF. Ultimately feel this patient would benefit from post-acute rehab, as pt;s husband reports difficulty safely managing transfers and care at home. Family is prepared to care for her at home, however will need hospital bed and could benefit from further training for safe transfers. Recommend CIR to decrease burden of care and optimize family education prior to pt return home. Pt currently with functional limitations due to the deficits listed below (see PT Problem List). Acutely, pt will benefit from skilled PT to increase their independence and safety with mobility to allow discharge to the venue listed below.         Recommendations for follow up therapy are one component of a multi-disciplinary discharge planning process, led by the attending physician.  Recommendations may be updated based on patient status, additional functional criteria and insurance authorization.  Follow Up Recommendations Acute inpatient rehab (3hours/day)    Assistance Recommended at Discharge  Frequent or constant Supervision/Assistance  Functional Status Assessment Patient has had a recent decline in their functional status and demonstrates the ability to make significant improvements in function in a reasonable and predictable amount of time.  Equipment Recommendations  Wheelchair (measurements PT);Other (comment) [employer] bed)    Recommendations for Other Services Rehab consult     Precautions / Restrictions Precautions Precautions: Fall;Back Precaution Booklet Issued: No Precaution Comments: verbally reviewed back precautions and pt was cued for precautions during functional mobility. Required Braces or Orthoses: Spinal Brace Spinal Brace: Lumbar corset;Applied in sitting position Restrictions Weight Bearing Restrictions: No      Mobility  Bed Mobility Overal bed mobility: Needs Assistance Bed Mobility: Rolling;Sidelying to Sit Rolling: Mod assist;+2 for physical assistance Sidelying to sit: Max assist;+2 for physical assistance       General bed mobility comments: Assist for all aspects of bed mobility. Increased cues for railing use. Bed pad utilized for full roll to prepare to sit.    Transfers Overall transfer level: Needs assistance Equipment used: Rolling walker (2 wheels) Transfers: Sit to/from [facility] Sit to Stand: Min assist;+2 physical assistance Stand pivot transfers: Mod assist;+2 physical assistance         General transfer comment: +2 assist for power-up to full stand, and increased assist for walker management during pivotal steps around to the chair. Pt with increased difficulty advancing RLE.    Ambulation/Gait             General Gait Details: Unable to progress to gait training at this time.  Stairs            Wheelchair Mobility    Modified Rankin (Stroke Patients  Only)       Balance Overall balance assessment: Needs assistance Sitting-balance support: Feet unsupported;Bilateral upper  extremity supported Sitting balance-Leahy Scale: Fair Sitting balance - Comments: fair-poor. mostly sits with BUE. with encouragement, can sit long enough to wash face with single UE support.   Standing balance support: Bilateral upper extremity supported;During functional activity;Reliant on assistive device for balance Standing balance-Leahy Scale: Zero Standing balance comment: +2 assist required.                             Pertinent Vitals/Pain Pain Assessment: Faces Faces Pain Scale: Hurts even more Pain Location: Back Pain Descriptors / Indicators: Operative site guarding;Sore Pain Intervention(s): Limited activity within patient's tolerance;Monitored during session;Repositioned    Home Living Family/patient expects to be discharged to:: Private residence Living Arrangements: Spouse/significant other Available Help at Discharge: Family;Available 24 hours/day (Husband) Type of Home: House Home Access: Ramped entrance       Home Layout: One level Home Equipment: Conservation officer, nature (2 wheels);BSC;Shower seat;Grab bars - tub/shower Additional Comments: Husband ordered and installed a grab bar at tub that is also within reach of toilet to assist with transfers    Prior Function Prior Level of Function : Needs assist       Physical Assist : Mobility (physical);ADLs (physical) Mobility (physical): Bed mobility;Transfers;Gait ADLs (physical): Bathing;Dressing;Toileting Mobility Comments: Husband reports pt at a transfers only level at home and has not been ambulating ADLs Comments: Husband assisting with bathing and dressing. She gets on the [facility] with a SPT with the RW and he assists from there for bathing.     Hand Dominance   Dominant Hand: Right    Extremity/Trunk Assessment   Upper Extremity Assessment Upper Extremity Assessment: Defer to OT evaluation    Lower Extremity Assessment Lower Extremity Assessment: Generalized weakness;RLE deficits/detail RLE  Deficits / Details: Generalized weakness bilaterally, however RLE appears weaker than L. Pt with increased difficulty advancing RLE during OOB mobility.    Cervical / Trunk Assessment Cervical / Trunk Assessment: Kyphotic;Back Surgery  Communication   Communication: HOH  Cognition Arousal/Alertness: Awake/alert Behavior During Therapy: Flat affect Overall Cognitive Status: Impaired/Different from baseline Area of Impairment: Attention;Memory;Following commands;Safety/judgement;Awareness;Problem solving                 Orientation Level: Place;Time;Situation Current Attention Level: Sustained Memory: Decreased recall of precautions Following Commands: Follows one step commands inconsistently;Follows one step commands with increased time;Follows multi-step commands inconsistently Safety/Judgement: Decreased awareness of safety Awareness: Emergent Problem Solving: Slow processing;Difficulty sequencing;Requires verbal cues General Comments: Husband reports pt has been hallucinating and reports concern that the pain medications have been causing this change in her cognition. Encouraged husband to discuss concerns with MD.        General Comments      Exercises     Assessment/Plan    PT Assessment Patient needs continued PT services  PT Problem List Decreased strength;Decreased activity tolerance;Decreased balance;Decreased mobility;Decreased coordination;Decreased cognition;Decreased knowledge of use of DME;Decreased safety awareness;Cardiopulmonary status limiting activity;Decreased knowledge of precautions;Pain       PT Treatment Interventions DME instruction;Gait training;Functional mobility training;Therapeutic activities;Therapeutic exercise;Balance training;Neuromuscular re-education;Cognitive remediation;Patient/family education;Wheelchair mobility training    PT Goals (Current goals can be found in the Care Plan section)  Acute Rehab PT Goals Patient Stated Goal:  Family's goal is to get pt back home PT Goal Formulation: Patient unable to participate in goal setting Time For Goal Achievement: 08/25/21 Potential to Achieve Goals: Fair  Frequency Min 5X/week   Barriers to discharge        Co-evaluation PT/OT/SLP Co-Evaluation/Treatment: Yes Reason for Co-Treatment: Complexity of the patient's impairments (multi-system involvement);Necessary to address cognition/behavior during functional activity;For patient/therapist safety;To address functional/ADL transfers PT goals addressed during session: Mobility/safety with mobility;Balance;Proper use of DME OT goals addressed during session: ADL's and self-care;Proper use of Adaptive equipment and DME       AM-PAC PT "6 Clicks" Mobility  Outcome Measure Help needed turning from your back to your side while in a flat bed without using bedrails?: A Lot Help needed moving from lying on your back to sitting on the side of a flat bed without using bedrails?: Total Help needed moving to and from a bed to a chair (including a wheelchair)?: Total Help needed standing up from a chair using your arms (e.g., wheelchair or bedside chair)?: Total Help needed to walk in hospital room?: Total Help needed climbing 3-5 steps with a railing? : Total 6 Click Score: 7    End of Session Equipment Utilized During Treatment: Gait belt;Back brace Activity Tolerance: Patient tolerated treatment well Patient left: in chair;with call bell/phone within reach;with chair alarm set;with family/visitor present;with nursing/sitter in room Nurse Communication: Mobility status PT Visit Diagnosis: Unsteadiness on feet (R26.81);Pain;Muscle weakness (generalized) (M62.81) Pain - part of body:  (back)    Time: 7169-6789 PT Time Calculation (min) (ACUTE ONLY): 51 min   Charges:   PT Evaluation $PT Eval Moderate Complexity: 1 Mod PT Treatments $Gait Training: 8-22 mins        Rolinda Roan, PT, DPT Acute Rehabilitation  Services Pager: [phone] Office: [phone]   Thelma Comp 08/11/2021, 2:53 PM

## 2021-08-11 NOTE — Progress Notes (Signed)
Inpatient Rehab Admissions Coordinator Note:   Per PT/OT patient was screened for CIR candidacy by Karron Alvizo Danford Bad, CCC-SLP. At this time, pt appears to be a potential candidate for CIR. I will place an order for rehab consult for full assessment, per our protocol.  Note, it may be difficult to get insurance approval with [facility] for CIR. Please contact me any with questions.Gayland Curry, Eva, Bartholomew Admissions Coordinator [phone] 08/11/21 5:24 PM

## 2021-08-12 MED ORDER — HYDROCODONE-ACETAMINOPHEN 5-325 MG PO TABS
1.0000 | ORAL_TABLET | ORAL | Status: DC | PRN
Start: 2021-08-12 — End: 2021-08-19
  Administered 2021-08-12 – 2021-08-13 (×2): 2 via ORAL
  Filled 2021-08-12 (×2): qty 2

## 2021-08-12 NOTE — Progress Notes (Signed)
   Providing Compassionate, Quality Care - Together   Subjective: Patient's husband (at bedside) and daughter (on phone) report patient with increased drowsiness with the pain medication.  Objective: Vital signs in last 24 hours: Temp:  [97.9 F (36.6 C)-98.2 F (36.8 C)] 98 F (36.7 C) (11/06 0812) Pulse Rate:  [77-91] 88 (11/06 0812) Resp:  [17-19] 19 (11/06 0812) BP: (115-179)/(46-88) 140/54 (11/06 0827) SpO2:  [89 %-100 %] 100 % (11/06 0812)  Intake/Output from previous day: 11/05 0701 - 11/06 0700 In: 237.4 [P.O.:60; I.V.:177.4] Out: 1150 [Urine:1150] Intake/Output this shift: No intake/output data recorded.  VSS Very drowsy, requires repeated stimulation to arouse Oriented to self PERRLA MAE, Strength and sensation intact Incision is covered with Honeycomb dressing; Dressing is clean, dry, and intact  Lab Results: Recent Labs    08/10/21 2141  WBC 16.7*  HGB 12.6  HCT 38.3  PLT 365   BMET Recent Labs    08/10/21 2141  CREATININE 0.50    Studies/Results: DG THORACOLUMABAR SPINE  Result Date: 08/10/2021 CLINICAL DATA:  T10-T11, L1-L2 fusion. EXAM: THORACOLUMBAR SPINE 1V COMPARISON:  Lumbar spine x-ray 07/10/2021. CT thoracic spine 05/15/2021. FINDINGS: Intraoperative thoracolumbar spine fusion. One low resolution intraoperative spot views of the thoracolumbar spine were obtained. Thoracolumbar spine fusion hardware present. Drainage catheter overlies the image. Total fluoroscopy time: 3 minutes 45 seconds IMPRESSION: Intraoperative thoracolumbar spine fusion. Electronically Signed   By: Ronney Asters M.D.   On: 08/10/2021 15:12   DG C-Arm 1-60 Min-No Report  Result Date: 08/10/2021 Fluoroscopy was utilized by the requesting physician.  No radiographic interpretation.   DG C-Arm 1-60 Min-No Report  Result Date: 08/10/2021 Fluoroscopy was utilized by the requesting physician.  No radiographic interpretation.   DG C-Arm 1-60 Min-No Report  Result Date:  08/10/2021 Fluoroscopy was utilized by the requesting physician.  No radiographic interpretation.    Assessment/Plan: Patient had fhad intractable pain due to nonhealing T12 fracture.  She underwent open reduction internal fixation T12, with posterior lateral fusion T10-L2, posterolateral arthrodesis by Dr. Christella Noa on 08/10/2021.    LOS: 2 days   -Continue to encourage mobilization -Therapies recommending CIR  -Morphine discontinued. Utilize PO pain medication for pain management.  Viona Gilmore, DNP, AGNP-C Nurse Practitioner  [facility] [place] [address], [address], [place], [place] [address] P: [phone]    F: [phone]  08/12/2021, 12:07 PM

## 2021-08-12 NOTE — PMR Pre-admission (Shared)
PMR Admission Coordinator Pre-Admission Assessment  Patient: Mckenzie [name] is an 75 y.o., female MRN: [id number] DOB: [birth year] Height: 5\' 5"  (165.1 cm) Weight: 59 kg  Insurance Information HMO: ***    PPO: ***     PCP:      IPA:      80/20:      OTHER:  PRIMARY: [employer] Medicare      Policy#: [id number]      Subscriber: patient CM Name: ***      Phone#: ***     Fax#: *** Pre-Cert#: ***      Employer: *** Benefits:  Phone #: ***     Name: *** Irene Shipper. Date: ***     Deduct: ***      Out of Pocket Max: ***      Life Max: *** CIR: ***      SNF: *** Outpatient: ***     Co-Pay: *** Home Health: ***      Co-Pay: *** DME: ***     Co-Pay: *** Providers: in-network SECONDARY:       Policy#:      Phone#:   Financial Counselor:       Phone#:   The Actuary for patients in Inpatient Rehabilitation Facilities with attached Privacy Act [place] Records was provided and verbally reviewed with: {CHL IP Patient Family HY:[id number]}  Emergency Contact Information Contact Information     Name Relation Home Work [place] Spouse [phone]  [phone]   [place] Daughter   [phone]       Current Medical History  Patient Admitting Diagnosis: compression fx of T12 vertebra, s/p ORIF T12 fx with posterior lateral fusion T12-L2 History of Present Illness: Pt is a 75 year old female with medical hx significant for: Htn, 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 Pt presented to hospital on 11/4 d/t intractable pain d/t nonhealing T12 fx. Since March of this year, pt has sustained four compression fractures in the thoracic spine: T1, T4, T12, T11. X-ray revealed new fx at T11. Pt noted to have come to ED ~4 times since August, with last visit being on October 14th d/t confusion thought to be caused by pain medications. Pt underwent ORIF of T12 fx with posterior lateral fusion T12-L2,  posterolateral arthrodesis on 11/4. Therapy evaluations completed and CIR recommended d/t pt's deficits in functional mobility and inability to complete ADLs independently.     Patient[facility] has been reviewed by the rehabilitation admission coordinator and physician.  Past Medical History  Past Medical History:  Diagnosis Date   Anemia    low iron   Arthritis    Depression    High cholesterol    Hypertension    Retinal micro-aneurysm of right eye    Stroke ([place])    2 or 3 mini strokes    Has the patient had major surgery during 100 days prior to admission? Yes  Family History   family history includes COPD in her father; Depression in her sister; Diabetes in her mother; Drug abuse in her brother; Healthy in her daughter, daughter, and daughter; Hyperlipidemia in her mother; Hypertension in her mother; Suicidality in her brother.  Current Medications  Current Facility-Administered Medications:    0.9 %  sodium chloride infusion, 250 mL, Intravenous, Continuous, Cabbell, Kyle, MD   0.9 % NaCl with KCl 20 mEq/ L  infusion, , Intravenous, Continuous, Ashok Pall, MD, Stopped at  08/11/21 0928   acetaminophen (TYLENOL) tablet 650 mg, 650 mg, Oral, Q4H PRN **OR** acetaminophen (TYLENOL) suppository 650 mg, 650 mg, Rectal, Q4H PRN, Ashok Pall, MD   amLODipine (NORVASC) tablet 5 mg, 5 mg, Oral, Daily, Cabbell, Kyle, MD, 5 mg at 08/12/21 5631   aspirin EC tablet 81 mg, 81 mg, Oral, Daily, Ashok Pall, MD, 81 mg at 08/12/21 4970   atorvastatin (LIPITOR) tablet 40 mg, 40 mg, Oral, Daily, Ashok Pall, MD, 40 mg at 08/12/21 2637   bisacodyl (DULCOLAX) EC tablet 5 mg, 5 mg, Oral, Daily PRN, Ashok Pall, MD   brimonidine (ALPHAGAN) 0.2 % ophthalmic solution 1 drop, 1 drop, Both Eyes, Q12H, 1 drop at 08/12/21 1000 **AND** timolol (TIMOPTIC) 0.5 % ophthalmic solution 1 drop, 1 drop, Both Eyes, Q12H, Paytes, Austin A, RPH, 1 drop at 08/12/21 1000    calcium-vitamin D (OSCAL WITH D) 500MG -200UNIT (5MCG) per tablet 1 tablet, 1 tablet, Oral, Q breakfast, Hammons, Theone Murdoch, RPH, 1 tablet at 08/12/21 0813   Chlorhexidine Gluconate Cloth 2 % PADS 6 each, 6 each, Topical, Q0600, Ashok Pall, MD   cholecalciferol (VITAMIN D3) tablet 1,000 Units, 1,000 Units, Oral, Daily, Reome, Earle J, RPH, 1,000 Units at 08/11/21 2212   darifenacin (ENABLEX) 24 hr tablet 15 mg, 15 mg, Oral, Daily, Cabbell, Marylyn Ishihara, MD, 15 mg at 08/12/21 8588   diazepam (VALIUM) tablet 2 mg, 2 mg, Oral, Q6H PRN, Ashok Pall, MD   dorzolamide (TRUSOPT) 2 % ophthalmic solution 1 drop, 1 drop, Left Eye, BID, Ashok Pall, MD, 1 drop at 08/12/21 1000   escitalopram (LEXAPRO) tablet 20 mg, 20 mg, Oral, Daily, Cabbell, Marylyn Ishihara, MD, 20 mg at 08/12/21 0812   [START ON 08/13/2021] ferrous sulfate tablet 325 mg, 325 mg, Oral, Once per day on Mon Wed Fri, Cabbell, Marylyn Ishihara, MD   fluorometholone (FML) 0.1 % ophthalmic suspension 1 drop, 1 drop, Right Eye, BID, Paytes, Austin A, RPH, 1 drop at 08/12/21 1000   furosemide (LASIX) tablet 10 mg, 10 mg, Oral, q AM, Ashok Pall, MD, 10 mg at 08/12/21 5027   heparin injection 5,000 Units, 5,000 Units, Subcutaneous, Q8H, Ashok Pall, MD, 5,000 Units at 08/12/21 0617   HYDROcodone-acetaminophen ([place]) 7.5-325 MG per tablet 1 tablet, 1 tablet, Oral, Q6H, Ashok Pall, MD, 1 tablet at 08/12/21 0616   HYDROcodone-acetaminophen (NORCO/VICODIN) 5-325 MG per tablet 1 tablet, 1 tablet, Oral, Q4H PRN, Ashok Pall, MD, 1 tablet at 08/11/21 0919   HYDROcodone-acetaminophen (NORCO/VICODIN) 5-325 MG per tablet 2 tablet, 2 tablet, Oral, Q4H PRN, Ashok Pall, MD, 2 tablet at 08/10/21 2137   ketorolac (ACULAR) 0.5 % ophthalmic solution 1 drop, 1 drop, Right Eye, q morning, Paytes, Austin A, RPH, 1 drop at 08/12/21 1000   labetalol (NORMODYNE) injection 10 mg, 10 mg, Intravenous, Q2H PRN, Bergman, Meghan D, NP, 10 mg at 08/12/21 0820   latanoprost (XALATAN) 0.005 %  ophthalmic solution 1 drop, 1 drop, Both Eyes, QHS, Paytes, Austin A, RPH   lisinopril (ZESTRIL) tablet 10 mg, 10 mg, Oral, Daily, Cabbell, Kyle, MD, 10 mg at 08/12/21 7412   magnesium oxide (MAG-OX) tablet 400 mg, 400 mg, Oral, q AM, Ashok Pall, MD, 400 mg at 08/12/21 0813   menthol-cetylpyridinium (CEPACOL) lozenge 3 mg, 1 lozenge, Oral, PRN **OR** phenol (CHLORASEPTIC) mouth spray 1 spray, 1 spray, Mouth/Throat, PRN, Ashok Pall, MD   morphine 2 MG/ML injection 1 mg, 1 mg, Intravenous, Q2H PRN, Ashok Pall, MD, 1 mg at 08/12/21 0144   multivitamin with minerals tablet 1  tablet, 1 tablet, Oral, q AM, Ashok Pall, MD, 1 tablet at 08/12/21 2956   mupirocin ointment (BACTROBAN) 2 % 1 application, 1 application, Nasal, BID, Ashok Pall, MD, 1 application at 21/30/86 0813   ondansetron (ZOFRAN) tablet 4 mg, 4 mg, Oral, Q6H PRN **OR** ondansetron (ZOFRAN) injection 4 mg, 4 mg, Intravenous, Q6H PRN, Ashok Pall, MD, 4 mg at 08/11/21 0053   predniSONE (DELTASONE) tablet 10 mg, 10 mg, Oral, Q breakfast, Cabbell, Marylyn Ishihara, MD, 10 mg at 08/12/21 0813   senna (SENOKOT) tablet 8.6 mg, 1 tablet, Oral, BID, Ashok Pall, MD, 8.6 mg at 08/12/21 5784   senna-docusate (Senokot-S) tablet 1 tablet, 1 tablet, Oral, QHS PRN, Ashok Pall, MD   sodium chloride flush (NS) 0.9 % injection 3 mL, 3 mL, Intravenous, Q12H, Cabbell, Kyle, MD, 3 mL at 08/12/21 0818   sodium chloride flush (NS) 0.9 % injection 3 mL, 3 mL, Intravenous, PRN, Ashok Pall, MD   sodium phosphate (FLEET) 7-19 GM/118ML enema 1 enema, 1 enema, Rectal, Once PRN, Ashok Pall, MD  Patients Current Diet:  Diet Order             Diet Heart Room service appropriate? Yes; Fluid consistency: Thin  Diet effective now                   Precautions / Restrictions Precautions Precautions: Fall, Back Precaution Booklet Issued: No Precaution Comments: verbally reviewed back precautions and pt was cued for precautions during functional  mobility. Spinal Brace: Lumbar corset, Applied in sitting position Restrictions Weight Bearing Restrictions: No   Has the patient had 2 or more falls or a fall with injury in the past year? Yes  Prior Activity Level Limited Community (1-2x/wk): docto's appointments  Prior Functional Level Self Care: Did the patient need help bathing, dressing, using the toilet or eating? Needed some help  Indoor Mobility: Did the patient need assistance with walking from room to room (with or without device)? Needed some help  Stairs: Did the patient need assistance with internal or external stairs (with or without device)? Needed some help  Functional Cognition: Did the patient need help planning regular tasks such as shopping or remembering to take medications? Needed some help  Patient Information Are you of Hispanic, Latino/a,or Spanish origin?: X. Patient unable to respond, A. No, not of Hispanic, Latino/a, or Spanish origin What is your race?: X. Patient unable to respond, A. White Do you need or want an interpreter to communicate with a doctor or health care staff?: 9. Unable to respond  Patient's Response To:  Health Literacy and Transportation Is the patient able to respond to health literacy and transportation needs?: No Health Literacy - How often do you need to have someone help you when you read instructions, pamphlets, or other written material from your doctor or pharmacy?: Patient unable to respond  [place] / [place] Devices/Equipment: Gilford Rile (specify type) Home Equipment: Conservation officer, nature (2 wheels), BSC, Shower seat, Grab bars - tub/shower  Prior Device Use: Indicate devices/aids used by the patient prior to current illness, exacerbation or injury? Manual wheelchair and Walker  Current Functional Level Cognition  Overall Cognitive Status: Impaired/Different from baseline Current Attention Level: Sustained Orientation Level: Oriented to  person Following Commands: Follows one step commands inconsistently, Follows one step commands with increased time, Follows multi-step commands inconsistently Safety/Judgement: Decreased awareness of safety General Comments: Husband reports pt has been hallucinating and reports concern that the pain medications have been causing this change  in her cognition. Encouraged husband to discuss concerns with MD.    Extremity Assessment (includes Sensation/Coordination)  Upper Extremity Assessment: Defer to OT evaluation  Lower Extremity Assessment: Generalized weakness, RLE deficits/detail RLE Deficits / Details: Generalized weakness bilaterally, however RLE appears weaker than L. Pt with increased difficulty advancing RLE during OOB mobility.    ADLs  Overall ADL's : Needs assistance/impaired Eating/Feeding: Set up, Sitting Grooming: Minimal assistance, Sitting Grooming Details (indicate cue type and reason): able to wash face with setup. Min A for full grooming session d/t decreased activity tolerance Upper Body Bathing: Moderate assistance, Sitting Lower Body Bathing: Moderate assistance, Sit to/from stand, +2 for physical assistance Upper Body Dressing : Maximal assistance Lower Body Dressing: Moderate assistance, +2 for physical assistance, Sit to/from stand Toilet Transfer: Moderate assistance, +2 for physical assistance, Stand-pivot, BSC, Rolling walker (2 wheels) Toileting- Clothing Manipulation and Hygiene: Moderate assistance, +2 for physical assistance, Sit to/from stand General ADL Comments: Pt completed bed mobility, sat EOB a few minutes, washed face with setup. Pt then stood and took pivotal steps to sit up in recliner. +2 assist with transfers. Spouse present, daughter on speakerphone at end of session.    Mobility  Overal bed mobility: Needs Assistance Bed Mobility: Rolling, Sidelying to Sit Rolling: Mod assist, +2 for physical assistance Sidelying to sit: Max assist, +2 for  physical assistance General bed mobility comments: Assist for all aspects of bed mobility. Increased cues for railing use. Bed pad utilized for full roll to prepare to sit.    Transfers  Overall transfer level: Needs assistance Equipment used: Rolling walker (2 wheels) Transfers: Sit to/from Stand, Stand Pivot Transfers Sit to Stand: Min assist, +2 physical assistance Stand pivot transfers: Mod assist, +2 physical assistance General transfer comment: +2 assist for power-up to full stand, and increased assist for walker management during pivotal steps around to the chair. Pt with increased difficulty advancing RLE.    Ambulation / Gait / Stairs / Wheelchair Mobility  Ambulation/Gait General Gait Details: Unable to progress to gait training at this time.    Posture / Balance Dynamic Sitting Balance Sitting balance - Comments: fair-poor. mostly sits with BUE. with encouragement, can sit long enough to wash face with single UE support. Balance Overall balance assessment: Needs assistance Sitting-balance support: Feet unsupported, Bilateral upper extremity supported Sitting balance-Leahy Scale: Fair Sitting balance - Comments: fair-poor. mostly sits with BUE. with encouragement, can sit long enough to wash face with single UE support. Standing balance support: Bilateral upper extremity supported, During functional activity, Reliant on assistive device for balance Standing balance-Leahy Scale: Zero Standing balance comment: +2 assist required.    Special needs/care consideration Continuous Drip IV  0.9% sodium chloride infusion; 0.9% NaCl with KCl 20 mEq/L infusion, Oxygen 2L O2 via nasal cannula, and Skin Ecchymosis: arm/bilateral; Surgical incision: back   Previous Home Environment (from acute therapy documentation) Living Arrangements: Spouse/significant other  Lives With: Spouse Available Help at Discharge: Family, Available 24 hours/day Type of Home: House Home Layout: One level Home  Access: Ramped entrance Bathroom Shower/Tub: Chiropodist: Handicapped height Bathroom Accessibility: Yes How Accessible: Accessible via walker Home Care Services: Yes Type of Home Care Services: Home PT, Home OT Additional Comments: Husband ordered and installed a grab bar at tub that is also within reach of toilet to assist with transfers  Discharge [place] for Discharge Living Setting: Patient's home Type of Home at Discharge: [place]: One level Discharge Home Access: [place] entrance  Discharge Bathroom Shower/Tub: Tub/shower unit Discharge Bathroom Toilet: Handicapped height Discharge Bathroom Accessibility: Yes How Accessible: Accessible via walker Does the patient have any problems obtaining your medications?: No  Social/Family/Support Systems Anticipated Caregiver: Matricia Begnaud, husband and [name], daughter Anticipated Caregiver's Contact Information: Barnabas Lister: [phone] Caregiver Availability: 24/7 Discharge Plan Discussed with Primary Caregiver: Yes Is Caregiver In Agreement with Plan?: Yes Does Caregiver/Family have Issues with Lodging/Transportation while Pt is in Rehab?: No  Goals Patient/Family Goal for Rehab: *** Expected length of stay: *** Pt/Family Agrees to Admission and willing to participate: Yes Program Orientation Provided & Reviewed with Pt/Caregiver Including Roles  & Responsibilities: Yes  Decrease burden of Care through IP rehab admission: NA  Possible need for SNF placement upon discharge: Not anticipated  Patient Condition: {PATIENT'S CONDITION:22832}  Preadmission Screen Completed By:  Bethel Born, 08/12/2021 12:04 PM ______________________________________________________________________   Discussed status with Dr. Marland Kitchen on *** at *** and received approval for admission today.  Admission Coordinator:  Bethel Born, CCC-SLP, time ***/Date ***    Assessment/Plan: Diagnosis: Does the need for close, 24 hr/day Medical supervision in concert with the patient's rehab needs make it unreasonable for this patient to be served in a less intensive setting? {yes_no_potentially:3041433} Co-Morbidities requiring supervision/potential complications: *** Due to {due QH:2257505}, does the patient require 24 hr/day rehab nursing? {yes_no_potentially:3041433} Does the patient require coordinated care of a physician, rehab nurse, PT, OT, and SLP to address physical and functional deficits in the context of the above medical diagnosis(es)? {yes_no_potentially:3041433} Addressing deficits in the following areas: {deficits:3041436} Can the patient actively participate in an intensive therapy program of at least 3 hrs of therapy 5 days a week? {yes_no_potentially:3041433} The potential for patient to make measurable gains while on inpatient rehab is {potential:3041437} Anticipated functional outcomes upon discharge from inpatient rehab: {functional outcomes:304600100} PT, {functional outcomes:304600100} OT, {functional outcomes:304600100} SLP Estimated rehab length of stay to reach the above functional goals is: *** Anticipated discharge destination: {anticipated dc setting:21604} 10. Overall Rehab/Functional Prognosis: {potential:3041437}   MD Signature: ***

## 2021-08-12 NOTE — Progress Notes (Signed)
Inpatient Rehab Admissions:  Inpatient Rehab Consult received.  I met with patient and husband at the bedside for rehabilitation assessment and to discuss goals and expectations of an inpatient rehab admission.  Pt's daughter, [name] was on speakerphone. Pt was lethargic d/t medications so spoke with husband and daughter. Husband and daughter acknowledged understanding of CIR goals and expectations. Both interested in pt pursuing CIR. Will continue to follow.  Signed: Gayland Curry, [employer], [place] Admissions Coordinator [phone]

## 2021-08-12 NOTE — TOC Initial Note (Addendum)
Transition of Care [facility]) - Initial/Assessment Note    Patient Details  Name: Mckenzie [name] MRN: [id number] Date of Birth: November 24, 1945  Transition of [facility]) CM/SW Contact:    Carles Collet, RN Phone Number: 08/12/2021, 9:42 AM  Clinical Narrative:     Patient admitted from home w spouse w intractable pain to T12. Surgery on 11/4 Thoracic ten-Lumbar 2 Open reduction internal fixation t12 fracture Posterior lateral fusion t12-L2 posterolateral arthrodesis with allograft morsels. Current recs for CIR, liaison following, states insurance auth may be difficult. CSW reports family is not interested in SNF.  TOC will continue to follow.                  Barriers to Discharge: Continued Medical Work up   Patient Goals and CMS Choice        Expected Discharge Plan and Services     Discharge Planning Services: CM Consult   Living arrangements for the past 2 months: [place]                                      Prior Living Arrangements/Services Living arrangements for the past 2 months: Single Family Home Lives with:: Spouse                   Activities of Daily Living Home Assistive Devices/Equipment: Environmental consultant (specify type) ADL Screening (condition at time of admission) Patient's cognitive ability adequate to safely complete daily activities?: No Is the patient deaf or have difficulty hearing?: Yes Does the patient have difficulty seeing, even when wearing glasses/contacts?: Yes Does the patient have difficulty concentrating, remembering, or making decisions?: Yes Patient able to express need for assistance with ADLs?: Yes Does the patient have difficulty dressing or bathing?: Yes Independently performs ADLs?: No Communication: Independent Dressing (OT): Needs assistance Is this a change from baseline?: Change from baseline, expected to last <3days Grooming: Needs assistance Is this a change from baseline?: Change from baseline, expected to last  <3 days Feeding: Independent Is this a change from baseline?: Change from baseline, expected to last <3 days Bathing: Needs assistance Is this a change from baseline?: Change from baseline, expected to last <3 days Toileting: Needs assistance Is this a change from baseline?: Change from baseline, expected to last <3 days In/Out Bed: Dependent Is this a change from baseline?: Change from baseline, expected to last <3 days Walks in Home: Needs assistance Is this a change from baseline?: Pre-admission baseline Does the patient have difficulty walking or climbing stairs?: Yes Weakness of Legs: Both Weakness of Arms/Hands: Both  Permission Sought/Granted                  Emotional Assessment              Admission diagnosis:  Compression fracture of T12 vertebra ([place]) [S22.080A] Patient Active Problem List   Diagnosis Date Noted   Compression fracture of T12 vertebra ([place]) 08/10/2021   Hematoma of arm, left, subsequent encounter 07/24/2021   TIA (transient ischemic attack) 06/26/2021   Hypokalemia 06/26/2021   Acute CVA (cerebrovascular accident) ([place]) 06/26/2021   Compression fracture of T1 vertebra ([place]) 05/25/2021   Compression fracture of T4 vertebra ([place]) 05/25/2021   Fracture of sacrum ([place]) 05/25/2021   Fracture of manubrium 05/25/2021   Low back pain 05/23/2021   Depression 01/23/2021   High risk medication use 01/02/2021   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   Pericardial effusion 12/27/2020   Abnormal MRI, cervical spine 12/27/2020   Generalized weakness 12/27/2020   Steroid-induced hyperglycemia 12/27/2020   Malnutrition of moderate degree 12/11/2020   Pressure injury of skin 12/09/2020   Benign neoplasm of cecum    Benign neoplasm of descending colon    Hiatal hernia    Hyponatremia 12/08/2020   Thrombocytosis 12/08/2020   Hypoalbuminemia 12/08/2020   Memory loss 12/08/2020   Constipation 12/06/2020   Anemia of chronic disease 12/06/2020   Gait  disturbance 11/14/2020   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 11/14/2020   Cognitive change 11/14/2020   Adjustment disorder with mixed anxiety and depressed mood 10/10/2020   Fracture 10/06/2020   Distal radius fracture 47/42/5956   Acute metabolic encephalopathy 38/75/6433   Proximal weakness of extremity 09/26/2020   Myalgia 09/26/2020   Fall at home, subsequent encounter 09/26/2020   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 [place] [address] Phone: [phone] Fax: [place], [address] [place] [address] Phone: [phone] Fax: [phone]     Social Determinants of Health (SDOH) Interventions    Readmission Risk Interventions No flowsheet data found.

## 2021-08-13 ENCOUNTER — Inpatient Hospital Stay (HOSPITAL_COMMUNITY): Payer: Medicare Other

## 2021-08-13 ENCOUNTER — Encounter: Payer: Self-pay | Admitting: Family Medicine

## 2021-08-13 DIAGNOSIS — R2990 Unspecified symptoms and signs involving the nervous system: Secondary | ICD-10-CM

## 2021-08-13 LAB — CBC WITH DIFFERENTIAL/PLATELET
Abs Immature Granulocytes: 0.08 10*3/uL — ABNORMAL HIGH (ref 0.00–0.07)
Basophils Absolute: 0 10*3/uL (ref 0.0–0.1)
Basophils Relative: 0 %
Eosinophils Absolute: 0.1 10*3/uL (ref 0.0–0.5)
Eosinophils Relative: 1 %
HCT: 32.5 % — ABNORMAL LOW (ref 36.0–46.0)
Hemoglobin: 10.6 g/dL — ABNORMAL LOW (ref 12.0–15.0)
Immature Granulocytes: 1 %
Lymphocytes Relative: 11 %
Lymphs Abs: 1.3 10*3/uL (ref 0.7–4.0)
MCH: 29.8 pg (ref 26.0–34.0)
MCHC: 32.6 g/dL (ref 30.0–36.0)
MCV: 91.3 fL (ref 80.0–100.0)
Monocytes Absolute: 0.6 10*3/uL (ref 0.1–1.0)
Monocytes Relative: 5 %
Neutro Abs: 9.9 10*3/uL — ABNORMAL HIGH (ref 1.7–7.7)
Neutrophils Relative %: 82 %
Platelets: 317 10*3/uL (ref 150–400)
RBC: 3.56 MIL/uL — ABNORMAL LOW (ref 3.87–5.11)
RDW: 13.6 % (ref 11.5–15.5)
WBC: 12 10*3/uL — ABNORMAL HIGH (ref 4.0–10.5)
nRBC: 0 % (ref 0.0–0.2)

## 2021-08-13 LAB — COMPREHENSIVE METABOLIC PANEL
ALT: 18 U/L (ref 0–44)
AST: 17 U/L (ref 15–41)
Albumin: 2.5 g/dL — ABNORMAL LOW (ref 3.5–5.0)
Alkaline Phosphatase: 133 U/L — ABNORMAL HIGH (ref 38–126)
Anion gap: 9 (ref 5–15)
BUN: 15 mg/dL (ref 8–23)
CO2: 30 mmol/L (ref 22–32)
Calcium: 9.3 mg/dL (ref 8.9–10.3)
Chloride: 98 mmol/L (ref 98–111)
Creatinine, Ser: 0.6 mg/dL (ref 0.44–1.00)
GFR, Estimated: >60 mL/min (ref >60)
Glucose, Bld: 171 mg/dL — ABNORMAL HIGH (ref 70–99)
Potassium: 3.4 mmol/L — ABNORMAL LOW (ref 3.5–5.1)
Sodium: 137 mmol/L (ref 135–145)
Total Bilirubin: 0.6 mg/dL (ref 0.3–1.2)
Total Protein: 5.8 g/dL — ABNORMAL LOW (ref 6.5–8.1)

## 2021-08-13 LAB — GLUCOSE, CAPILLARY: Glucose-Capillary: 161 mg/dL — ABNORMAL HIGH (ref 70–99)

## 2021-08-13 MED ORDER — ENSURE ENLIVE PO LIQD
237.0000 mL | Freq: Two times a day (BID) | ORAL | Status: DC
Start: 2021-08-14 — End: 2021-08-19
  Administered 2021-08-14 – 2021-08-19 (×11): 237 mL via ORAL

## 2021-08-13 MED ORDER — HALOPERIDOL LACTATE 5 MG/ML IJ SOLN
0.5000 mg | Freq: Once | INTRAMUSCULAR | Status: AC
  Administered 2021-08-13: 0.5 mg via INTRAVENOUS
  Filled 2021-08-13: qty 1

## 2021-08-13 NOTE — Progress Notes (Signed)
This RN responded to text/page for code stroke 3W36. Spoke with IT trainer who is requesting another IV for patient.

## 2021-08-13 NOTE — Progress Notes (Signed)
Occupational Therapy Treatment Patient Details Name: Mckenzie [name] MRN: [id number] DOB: 1946/09/04 Today's Date: 08/13/2021   History of present illness Pt is a 75 y/o female who presents with intractable pain due to nonhealing T12 fracture. She underwent ORIF T12 fracture with posterior lateral fusion T10-L2 on 08/10/2021. She has sustained, since March of this year< 4 compression fractures in the thoracic spine; T1, T4, T11, T12. 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   OT comments  Mckenzie [name] is making limited progress, session completed with PT to address safe mobility and ADLs. Pt with apparent decline in attention and cognition this session compared to therapy session on 11/5, and required increased assistance for all aspects of her care. Pt noted with difficulty locating items in her L environment, and unable to attend to stimuli at or to the L of midline for more than a few seconds. While sitting EOB, pt pushing herself towards the R and required assistance for sitting balance. Pt was ultimate max A +2 for all mobility this session, and her husband agrees this is a change in function; RN notified of pt appearance. Vision and AROM were difficult to assess due to pts cognition and command following. Pt will continue to benefit from OT acutely. D/c recommendation remains appropriate.    Recommendations for follow up therapy are one component of a multi-disciplinary discharge planning process, led by the attending physician.  Recommendations may be updated based on patient status, additional functional criteria and insurance authorization.    Follow Up Recommendations  Acute inpatient rehab (3hours/day)    Assistance Recommended at Discharge Frequent or constant Supervision/Assistance  Equipment Recommendations  Other (comment)    Recommendations for Other Services Rehab consult    Precautions / Restrictions  Precautions Precautions: Fall;Back Precaution Booklet Issued: No Precaution Comments: Pt was cued for maintenance of back precautions during functional mobility. Required Braces or Orthoses: Spinal Brace Spinal Brace: Lumbar corset;Applied in sitting position Restrictions Weight Bearing Restrictions: No       Mobility Bed Mobility Overal bed mobility: Needs Assistance Bed Mobility: Rolling;Sidelying to Sit;Sit to Sidelying Rolling: Total assist;+2 for physical assistance Sidelying to sit: Total assist;+2 for physical assistance;HOB elevated     Sit to sidelying: Total assist;+2 for physical assistance General bed mobility comments: Assist for all aspects of bed mobility. Increased cues for railing use. Noted pt with difficulty looking L to find railing and even with hand-over-hand assist pt keeping head turned R as therapists initiated roll to the L.    Transfers Overall transfer level: Needs assistance                 General transfer comment: Deferred further mobility due to decline in function and decreased cognition.     Balance Overall balance assessment: Needs assistance Sitting-balance support: Feet unsupported;Bilateral upper extremity supported Sitting balance-Leahy Scale: Zero Sitting balance - Comments: Max assist required to maintain sitting balance grossly. Initially upon sit pt was able to sit with mod assist and progressed to needing max assist within ~15-20 seconds.       Standing balance comment: Unable to assess                           ADL either performed or assessed with clinical judgement   ADL Overall ADL's : Needs assistance/impaired     Grooming: Minimal assistance;Bed level  Extremity/Trunk Assessment Upper Extremity Assessment Upper Extremity Assessment: Generalized weakness;Difficult to assess due to impaired cognition            Vision   Vision Assessment?:  Vision impaired- to be further tested in functional context Additional Comments: pt can track towards the L however unable to hold attention and required incrased cues to locate items in L environment - difficutl to fully assess due to cognition this session   Perception     Praxis      Cognition Arousal/Alertness: Awake/alert Behavior During Therapy: Flat affect Overall Cognitive Status: Impaired/Different from baseline Area of Impairment: Attention;Memory;Following commands;Safety/judgement;Awareness;Problem solving;Orientation                 Orientation Level:  (Did not ask specific orientation questions however appeared generally confused) Current Attention Level: Focused Memory: Decreased recall of precautions Following Commands: Follows one step commands inconsistently;Follows one step commands with increased time Safety/Judgement: Decreased awareness of safety Awareness: Intellectual Problem Solving: Slow processing;Difficulty sequencing;Requires verbal cues;Decreased initiation General Comments: Cognition appears to have declined since initial evaluation on 11/5. Pt now with L inattention, with decreased verbal responses. Mostly repeating what therapists are saying to her.          Exercises     Shoulder Instructions       General Comments VSS, husband present and stated that he thought he noticed some facial droop    Pertinent Vitals/ Pain       Pain Assessment: Faces Faces Pain Scale: Hurts even more Breathing: normal Negative Vocalization: none Facial Expression: smiling or inexpressive Body Language: relaxed Consolability: no need to console PAINAD Score: 0 Pain Location: Back Pain Descriptors / Indicators: Operative site guarding;Sore Pain Intervention(s): Limited activity within patient's tolerance;Monitored during session;Repositioned  Home Living Family/patient expects to be discharged to:: Private residence                                         Prior Functioning/Environment              Frequency  Min 2X/week        Progress Toward Goals  OT Goals(current goals can now be found in the care plan section)  Progress towards OT goals: Not progressing toward goals - comment (Pt wtih apparent decline since therapy on Saturday 11/5)  Acute Rehab OT Goals Patient Stated Goal: unable to state OT Goal Formulation: With patient/family Time For Goal Achievement: 08/25/21 Potential to Achieve Goals: Good ADL Goals Pt Will Perform Grooming: with set-up;sitting;with supervision Pt Will Perform Upper Body Dressing: with min assist;sitting Pt Will Perform Lower Body Dressing: sit to/from stand;with mod assist Pt Will Transfer to Toilet: with mod assist;stand pivot transfer;bedside commode Pt Will Perform Toileting - Clothing Manipulation and hygiene: with mod assist;sitting/lateral leans;sit to/from stand Additional ADL Goal #1: Pt will complete bed mobility at min A level to prepare for EOB/OOB ADLs.  Plan Discharge plan remains appropriate    Co-evaluation    PT/OT/SLP Co-Evaluation/Treatment: Yes Reason for Co-Treatment: Necessary to address cognition/behavior during functional activity;Complexity of the patient's impairments (multi-system involvement);For patient/therapist safety;To address functional/ADL transfers PT goals addressed during session: Mobility/safety with mobility OT goals addressed during session: ADL's and self-care      AM-PAC OT "6 Clicks" Daily Activity     Outcome Measure   Help from another person eating meals?: A Little Help from another person taking care of  personal grooming?: A Little Help from another person toileting, which includes using toliet, bedpan, or urinal?: A Lot Help from another person bathing (including washing, rinsing, drying)?: A Lot Help from another person to put on and taking off regular upper body clothing?: A Lot Help from another person to put on and  taking off regular lower body clothing?: A Lot 6 Click Score: 14    End of Session    OT Visit Diagnosis: Unsteadiness on feet (R26.81);Other abnormalities of gait and mobility (R26.89);Muscle weakness (generalized) (M62.81);History of falling (Z91.81);Other symptoms and signs involving cognitive function;Pain;Other symptoms and signs involving the nervous system (R29.898)   Activity Tolerance Patient limited by pain;Patient limited by lethargy   Patient Left in bed;with call bell/phone within reach;with bed alarm set;with family/visitor present   Nurse Communication Mobility status (pt with decline in function since therapy session on Saturday, new L inattention and decline in cognition)        Time: 6578-4696 OT Time Calculation (min): 27 min  Charges: OT General Charges $OT Visit: 1 Visit OT Treatments $Therapeutic Activity: 8-22 mins    Tabria Steines A Emali Heyward 08/13/2021, 2:05 PM

## 2021-08-13 NOTE — Progress Notes (Signed)
Inpatient Rehab Admissions Coordinator:  There are no beds available in CIR today.  Also awaiting medical clearance for potential CIR admission.  Will continue to follow.   Gayland Curry, [place], [place] Admissions Coordinator [phone]

## 2021-08-13 NOTE — Anesthesia Postprocedure Evaluation (Signed)
Anesthesia Post Note  Patient: Mckenzie [name]  Procedure(s) Performed: Thoracic ten-elevene, Lumbar one-two Posterior lateral fusion (Spine Lumbar)     Patient location during evaluation: PACU Anesthesia Type: General Level of consciousness: awake and alert Pain management: pain level controlled Vital Signs Assessment: post-procedure vital signs reviewed and stable Respiratory status: spontaneous breathing, nonlabored ventilation, respiratory function stable and patient connected to nasal cannula oxygen Cardiovascular status: blood pressure returned to baseline and stable Postop Assessment: no apparent nausea or vomiting Anesthetic complications: no   No notable events documented.  Last Vitals:  Vitals:   08/13/21 0015 08/13/21 0443  BP: (!) 119/53 (!) 145/59  Pulse: 83 85  Resp:    Temp: 37 C 36.7 C  SpO2: 99% 98%    Last Pain:  Vitals:   08/13/21 0542  TempSrc:   PainSc: [place]

## 2021-08-13 NOTE — Progress Notes (Signed)
OT team reported patient is being more confused on compared with her last visit in Saturday and Patient's husband reported the same, MD notified and neurologist been consulted, order taken, will continue to monitor.

## 2021-08-13 NOTE — Progress Notes (Addendum)
[name], female   DOB: 05-17-1946, 75 y.o.   MRN: [id number] BP 125/64 (BP Location: Left Arm)   Pulse 66   Temp 98.1 F (36.7 C) (Axillary)   Resp 18   Ht 5\' 5"  (1.651 m)   Wt 59 kg   SpO2 100%   BMI 21.63 kg/m  Alert, follows all commands Moving all extremities, left leg slightly weaker. Wounds are clean, dry Vert occluded by MRA, stroke service is shepherding the evaluation. No acute CVA on MRI Felt to have had too much pain medication. Will decrease

## 2021-08-13 NOTE — Progress Notes (Signed)
Physical Therapy Treatment Patient Details Name: Mckenzie [name] MRN: [id number] DOB: 05-27-1946 Today's Date: 08/13/2021   History of Present Illness Pt is a 75 y/o female who presents with intractable pain due to nonhealing T12 fracture. She underwent ORIF T12 fracture with posterior lateral fusion T10-L2 on 08/10/2021. She has sustained, since March of this year< 4 compression fractures in the thoracic spine; T1, T4, T11, T12. 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    PT Comments    Pt with decline in function and cognition since initial evaluation on 08/11/2021. Pt demonstrating L side inattention, with difficulty finding target in front of her, and immediately looking R when name called by therapist in front of her. Pt with limited verbalizations this session, mostly repeating what therapist had originally said to her. Up to max assist required for static sitting at EOB (heavy R lateral lean and posterior lean), and +2 total assist required for all aspects of bed mobility to transition to/from EOB. Husband present and reports he is concerned about a facial droop. RN notified of these new, stroke-like symptoms. Will continue to follow and progress as able per POC.    Recommendations for follow up therapy are one component of a multi-disciplinary discharge planning process, led by the attending physician.  Recommendations may be updated based on patient status, additional functional criteria and insurance authorization.  Follow Up Recommendations  Acute inpatient rehab (3hours/day)     Assistance Recommended at Discharge Frequent or constant Supervision/Assistance  Equipment Recommendations  Wheelchair (measurements PT);Other (comment) [facility] bed)    Recommendations for Other Services Rehab consult     Precautions / Restrictions Precautions Precautions: Fall;Back Precaution Booklet Issued: No Precaution Comments: Pt  was cued for maintenance of back precautions during functional mobility. Required Braces or Orthoses: Spinal Brace Spinal Brace: Lumbar corset;Applied in sitting position Restrictions Weight Bearing Restrictions: No     Mobility  Bed Mobility Overal bed mobility: Needs Assistance Bed Mobility: Rolling;Sidelying to Sit;Sit to Sidelying Rolling: Total assist;+2 for physical assistance Sidelying to sit: Total assist;+2 for physical assistance;HOB elevated     Sit to sidelying: Total assist;+2 for physical assistance General bed mobility comments: Assist for all aspects of bed mobility. Increased cues for railing use. Noted pt with difficulty looking L to find railing and even with hand-over-hand assist pt keeping head turned R as therapists initiated roll to the L.    Transfers                   General transfer comment: Deferred further mobility due to decline in function and decreased cognition.    Ambulation/Gait               General Gait Details: Unable to progress to gait training at this time.   Stairs             Wheelchair Mobility    Modified Rankin (Stroke Patients Only) Modified Rankin (Stroke Patients Only) Pre-Morbid Rankin Score: Moderately severe disability Modified Rankin: Severe disability     Balance Overall balance assessment: Needs assistance Sitting-balance support: Feet unsupported;Bilateral upper extremity supported Sitting balance-Leahy Scale: Zero Sitting balance - Comments: Max assist required to maintain sitting balance grossly. Initially upon sit pt was able to sit with mod assist and progressed to needing max assist within ~15-20 seconds.       Standing balance comment: Unable to assess  Cognition Arousal/Alertness: Awake/alert Behavior During Therapy: Flat affect Overall Cognitive Status: Impaired/Different from baseline Area of Impairment: Attention;Memory;Following  commands;Safety/judgement;Awareness;Problem solving;Orientation                 Orientation Level:  (Did not ask specific orientation questions however appeared generally confused) Current Attention Level: Focused Memory: Decreased recall of precautions Following Commands: Follows one step commands inconsistently;Follows one step commands with increased time Safety/Judgement: Decreased awareness of safety Awareness: Intellectual Problem Solving: Slow processing;Difficulty sequencing;Requires verbal cues;Decreased initiation General Comments: Cognition appears to have declined since initial evaluation on 11/5. Pt now with L inattention, with decreased verbal responses. Mostly repeating what therapists are saying to her.        Exercises      General Comments        Pertinent Vitals/Pain Pain Assessment: Faces Faces Pain Scale: Hurts even more Breathing: normal Negative Vocalization: none Facial Expression: smiling or inexpressive Body Language: relaxed Consolability: no need to console PAINAD Score: 0 Pain Location: Back Pain Descriptors / Indicators: Operative site guarding;Sore Pain Intervention(s): Limited activity within patient's tolerance;Monitored during session;Repositioned    Home Living Family/patient expects to be discharged to:: Private residence                        Prior Function            PT Goals (current goals can now be found in the care plan section) Acute Rehab PT Goals Patient Stated Goal: Family's goal is to get pt back home PT Goal Formulation: Patient unable to participate in goal setting Time For Goal Achievement: 08/25/21 Potential to Achieve Goals: Fair Progress towards PT goals: Not progressing toward goals - comment (Decline in function this date)    Frequency    Min 5X/week      PT Plan Current plan remains appropriate    Co-evaluation PT/OT/SLP Co-Evaluation/Treatment: Yes Reason for Co-Treatment: Necessary  to address cognition/behavior during functional activity;Complexity of the patient's impairments (multi-system involvement);For patient/therapist safety;To address functional/ADL transfers PT goals addressed during session: Mobility/safety with mobility OT goals addressed during session: ADL's and self-care      AM-PAC PT "6 Clicks" Mobility   Outcome Measure  Help needed turning from your back to your side while in a flat bed without using bedrails?: Total Help needed moving from lying on your back to sitting on the side of a flat bed without using bedrails?: Total Help needed moving to and from a bed to a chair (including a wheelchair)?: Total Help needed standing up from a chair using your arms (e.g., wheelchair or bedside chair)?: Total Help needed to walk in hospital room?: Total Help needed climbing 3-5 steps with a railing? : Total 6 Click Score: 6    End of Session Equipment Utilized During Treatment: Gait belt;Back brace Activity Tolerance: Patient limited by lethargy Patient left: in bed;with call bell/phone within reach;with nursing/sitter in room;with bed alarm set Nurse Communication: Mobility status PT Visit Diagnosis: Unsteadiness on feet (R26.81);Pain;Muscle weakness (generalized) (M62.81) Pain - part of body:  (back)     Time: 1517-6160 PT Time Calculation (min) (ACUTE ONLY): 28 min  Charges:  $Therapeutic Activity: 8-22 mins                     Rolinda Roan, PT, DPT Acute Rehabilitation Services Pager: [phone] Office: [phone]    Thelma Comp 08/13/2021, 2:01 PM

## 2021-08-13 NOTE — Consult Note (Signed)
Neurology Consultation  Reason for Consult: Left facial droop, left lower extremity weakness Referring Physician: Dr. Christella Noa  CC: Patient is without complaint on assessment  History is obtained from: Patient, Patient's husband at bedside, Chart review, bedside RN  HPI: Mckenzie [name] is a 75 y.o. 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 Patient was evaluated by bedside RN today with concerns for left facial asymmetry and left lower extremity weakness and a Code Stroke was activated for further neurology evaluation. Patient's husband at bedside states that the patient did have a significant left mouth droop following surgery on Saturday but this has since improved. Patient's last known well without a facial droop was on Friday prior to her surgery.   At baseline, prior to her T12 ORIF procedure on 11/4, patient was complaining of significant back pain and was wheelchair bound requiring assistance to stand.  Dr. Christella Noa also informed me that patient had some mild left leg weakness at baseline. LKW: Friday, 11/4 TNK given?: no, patient is outside of the time window for thrombolytic therapy IR Thrombectomy? No, presentation is not consistent with an LVO Modified Rankin Scale: 4-Needs assistance to walk and tend to bodily needs  ROS: A complete ROS was performed and is negative except as noted in the HPI.  Past Medical History:  Diagnosis Date   Anemia    low iron   Arthritis    Depression     High cholesterol    Hypertension    Retinal micro-aneurysm of right eye    Stroke ([place])    2 or 3 mini strokes   Past Surgical History:  Procedure Laterality Date   ARTERY BIOPSY Bilateral 12/15/2020   Procedure: BILATERAL TEMPORAL ARTERY BIOPSIES;  Surgeon: Angelia Mould, MD;  Location: [facility] OR;  Service: Vascular;  Laterality: Bilateral;   COLONOSCOPY WITH PROPOFOL N/A 12/09/2020   Procedure: COLONOSCOPY WITH PROPOFOL;  Surgeon: Gatha Mayer, MD;  Location: [facility] ENDOSCOPY;  Service: Endoscopy;  Laterality: N/A;   ESOPHAGOGASTRODUODENOSCOPY (EGD) WITH PROPOFOL N/A 12/09/2020   Procedure: ESOPHAGOGASTRODUODENOSCOPY (EGD) WITH PROPOFOL;  Surgeon: Gatha Mayer, MD;  Location: [place];  Service: Endoscopy;  Laterality: N/A;   EYE SURGERY     FINGER SURGERY Left    RING FINGER   IR THORACENTESIS ASP PLEURAL SPACE W/IMG GUIDE  12/13/2020   OPEN REDUCTION INTERNAL FIXATION (ORIF) DISTAL RADIAL FRACTURE Left 10/05/2020   Procedure: OPEN REDUCTION INTERNAL FIXATION (ORIF) DISTAL RADIAL FRACTURE;  Surgeon: Shona Needles, MD;  Location: [place];  Service: Orthopedics;  Laterality: Left;  supraclavicular block   POLYPECTOMY  12/09/2020   Procedure: POLYPECTOMY;  Surgeon: Gatha Mayer, MD;  Location[facility] ENDOSCOPY;  Service: Endoscopy;;   SPINAL FUSION  08/10/2021   T12-L2   TUBAL LIGATION     Family History  Problem Relation Age of Onset   Hyperlipidemia Mother    Hypertension Mother    Diabetes Mother    COPD Father    Depression Sister    Suicidality Brother    Drug abuse Brother  Healthy Daughter    Healthy Daughter    Healthy Daughter    Colon cancer Neg Hx    Celiac disease Neg Hx    Inflammatory bowel disease Neg Hx    Social History:   reports that she quit smoking about 40 years ago. Her smoking use included cigarettes. She has a 0.10 pack-year smoking history. She has never used smokeless tobacco. She reports that she does not drink alcohol and does not  use drugs.  Medications  Current Facility-Administered Medications:    0.9 %  sodium chloride infusion, 250 mL, Intravenous, Continuous, Cabbell, Kyle, MD   0.9 % NaCl with KCl 20 mEq/ L  infusion, , Intravenous, Continuous, Ashok Pall, MD, Stopped at 08/11/21 [phone]   acetaminophen (TYLENOL) tablet 650 mg, 650 mg, Oral, Q4H PRN **OR** acetaminophen (TYLENOL) suppository 650 mg, 650 mg, Rectal, Q4H PRN, Ashok Pall, MD   amLODipine (NORVASC) tablet 5 mg, 5 mg, Oral, Daily, Ashok Pall, MD, 5 mg at 08/13/21 1245   aspirin EC tablet 81 mg, 81 mg, Oral, Daily, Ashok Pall, MD, 81 mg at 08/13/21 0904   atorvastatin (LIPITOR) tablet 40 mg, 40 mg, Oral, Daily, Ashok Pall, MD, 40 mg at 08/13/21 0905   bisacodyl (DULCOLAX) EC tablet 5 mg, 5 mg, Oral, Daily PRN, Ashok Pall, MD   brimonidine (ALPHAGAN) 0.2 % ophthalmic solution 1 drop, 1 drop, Both Eyes, Q12H, 1 drop at 08/12/21 1000 **AND** timolol (TIMOPTIC) 0.5 % ophthalmic solution 1 drop, 1 drop, Both Eyes, Q12H, Cabbell, Kyle, MD, 1 drop at 08/12/21 1000   calcium-vitamin D (OSCAL WITH D) 500MG -200UNIT (5MCG) per tablet 1 tablet, 1 tablet, Oral, Q breakfast, Hammons, Kimberly B, RPH, 1 tablet at 08/13/21 0904   Chlorhexidine Gluconate Cloth 2 % PADS 6 each, 6 each, Topical, Q0600, Ashok Pall, MD, 6 each at 08/13/21 8099   cholecalciferol (VITAMIN D3) tablet 1,000 Units, 1,000 Units, Oral, Daily, Reome, Earle J, RPH, 1,000 Units at 08/12/21 2143   darifenacin (ENABLEX) 24 hr tablet 15 mg, 15 mg, Oral, Daily, Cabbell, Marylyn Ishihara, MD, 15 mg at 08/13/21 0932   diazepam (VALIUM) tablet 2 mg, 2 mg, Oral, Q6H PRN, Ashok Pall, MD   dorzolamide (TRUSOPT) 2 % ophthalmic solution 1 drop, 1 drop, Left Eye, BID, Ashok Pall, MD, 1 drop at 08/13/21 0907   escitalopram (LEXAPRO) tablet 20 mg, 20 mg, Oral, Daily, Cabbell, Marylyn Ishihara, MD, 20 mg at 08/13/21 8338   ferrous sulfate tablet 325 mg, 325 mg, Oral, Once per day on Mon Wed Fri, Cabbell, Marylyn Ishihara, MD, 325  mg at 08/13/21 2505   fluorometholone (FML) 0.1 % ophthalmic suspension 1 drop, 1 drop, Right Eye, BID, Ashok Pall, MD, 1 drop at 08/13/21 0908   furosemide (LASIX) tablet 10 mg, 10 mg, Oral, q AM, Ashok Pall, MD, 10 mg at 08/13/21 3976   haloperidol lactate (HALDOL) injection 0.5 mg, 0.5 mg, Intravenous, Once, Garvin Fila, MD   heparin injection 5,000 Units, 5,000 Units, Subcutaneous, Q8H, Ashok Pall, MD, 5,000 Units at 08/13/21 0501   HYDROcodone-acetaminophen (NORCO/VICODIN) 5-325 MG per tablet 1-2 tablet, 1-2 tablet, Oral, Q4H PRN, Viona Gilmore D, NP, 2 tablet at 08/13/21 0457   ketorolac (ACULAR) 0.5 % ophthalmic solution 1 drop, 1 drop, Right Eye, q morning, Cabbell, Marylyn Ishihara, MD, 1 drop at 08/13/21 0910   labetalol (NORMODYNE) injection 10 mg, 10 mg, Intravenous, Q2H PRN, Bergman, Meghan D, NP, 10 mg at 08/12/21 0820   latanoprost (XALATAN) 0.005 % ophthalmic solution 1 drop, 1 drop, Both  Eyes, QHS, Ashok Pall, MD   lisinopril (ZESTRIL) tablet 10 mg, 10 mg, Oral, Daily, Ashok Pall, MD, 10 mg at 08/13/21 0905   magnesium oxide (MAG-OX) tablet 400 mg, 400 mg, Oral, q AM, Ashok Pall, MD, 400 mg at 08/13/21 3007   menthol-cetylpyridinium (CEPACOL) lozenge 3 mg, 1 lozenge, Oral, PRN **OR** phenol (CHLORASEPTIC) mouth spray 1 spray, 1 spray, Mouth/Throat, PRN, Ashok Pall, MD   multivitamin with minerals tablet 1 tablet, 1 tablet, Oral, q AM, Ashok Pall, MD, 1 tablet at 08/13/21 0626   mupirocin ointment (BACTROBAN) 2 % 1 application, 1 application, Nasal, BID, Ashok Pall, MD, 1 application at 62/26/33 0906   ondansetron (ZOFRAN) tablet 4 mg, 4 mg, Oral, Q6H PRN **OR** ondansetron (ZOFRAN) injection 4 mg, 4 mg, Intravenous, Q6H PRN, Ashok Pall, MD, 4 mg at 08/11/21 0053   predniSONE (DELTASONE) tablet 10 mg, 10 mg, Oral, Q breakfast, Ashok Pall, MD, 10 mg at 08/13/21 3545   senna (SENOKOT) tablet 8.6 mg, 1 tablet, Oral, BID, Ashok Pall, MD, 8.6 mg at 08/13/21  0904   senna-docusate (Senokot-S) tablet 1 tablet, 1 tablet, Oral, QHS PRN, Ashok Pall, MD   sodium chloride flush (NS) 0.9 % injection 3 mL, 3 mL, Intravenous, Q12H, Ashok Pall, MD, 3 mL at 08/13/21 1037   sodium chloride flush (NS) 0.9 % injection 3 mL, 3 mL, Intravenous, PRN, Ashok Pall, MD   sodium phosphate (FLEET) 7-19 GM/118ML enema 1 enema, 1 enema, Rectal, Once PRN, Ashok Pall, MD  Exam: Current vital signs: BP (!) 119/51 (BP Location: Left Arm)   Pulse 79   Temp 98 F (36.7 C) (Axillary)   Resp 16   Ht 5\' 5"  (1.651 m)   Wt 59 kg   SpO2 100%   BMI 21.63 kg/m  Vital signs in last 24 hours: Temp:  [97.8 F (36.6 C)-99.6 F (37.6 C)] 98 F (36.7 C) (11/07 0800) Pulse Rate:  [79-93] 79 (11/07 0800) Resp:  [16-18] 16 (11/07 0800) BP: (119-168)/(51-61) 119/51 (11/07 0800) SpO2:  [98 %-100 %] 100 % (11/07 0800)  GENERAL: Awake, alert, in no acute distress Psych: Affect appropriate for situation, patient is calm and cooperative with examination Head: Normocephalic and atraumatic, without obvious abnormality EENT: Normal conjunctivae, dry mucous membranes, no OP obstruction. Extremely hard of hearing.  LUNGS: Normal respiratory effort. Non-labored breathing on room air. CV: Regular rate and rhythm on telemetry ABDOMEN: Soft, non-tender, non-distended Extremities: warm, well perfused, without obvious deformity. Left upper extremity with extensive ecchymosis at the forearm.  NEURO:  Mental Status: Awake, alert, and oriented to self, age, month, and place.  She is unable to provide a clear and coherent history of present illness and her husband at bedside provides most of the history. She is pleasantly confused and hard of hearing.  Speech/Language: speech is intact without dysarthria.  Naming and comprehension are intact.  No neglect is noted.  Diminished attention, registration and recall. Cranial Nerves:  II: PERRL. Visual fields full.  III, IV, VI: EOMI  without ptosis, nystagmus, or gaze preference.  V: Sensation is intact to light touch and symmetrical to face.  VII: Face is mildly asymmetric with mild left mouth drooping.  At rest but it improves with good effort. VIII: Hearing intact to loud voice, patient is extremely hard of hearing at baseline.  IX, X: Palate elevation is symmetric. Phonation normal.  XI: Normal sternocleidomastoid and trapezius muscle strength XII: Tongue protrudes midline without fasciculations.   Motor: 5/5 strength present in bilateral  upper extremities without vertical drift and proximally in bilateral lower extremities with 5/5 ankle plantar and dorsiflexion. Bilateral lower extremities proximally are diffusely weak with immediate drift to bed on assessment and minimal attempt at antigravity movement.  Effort is variable.  Good distal strength bilaterally.  Trace weakness of left ankle dorsiflexors. Tone is normal. Bulk is normal.  Sensation: Intact to light touch bilaterally in all four extremities. No extinction to DSS present.  Coordination: FTN intact bilaterally. Unable to assess HKS due to diffuse weakness. DTRs: 3+ and symmetric throughout Gait: Deferred  NIHSS: 1a Level of Conscious.: 0 1b LOC Questions: 0 1c LOC Commands: 0 2 Best Gaze: 0 3 Visual: 0 4 Facial Palsy: 1 5a Motor Arm - left: 0 5b Motor Arm - Right: 0 6a Motor Leg - Left: 2 6b Motor Leg - Right: 2 7 Limb Ataxia: 0 8 Sensory: 0 9 Best Language: 0 10 Dysarthria: 0 11 Extinct. and Inatten.: 0 TOTAL: 5  Labs I have reviewed labs in epic and the results pertinent to this consultation are: CBC    Component Value Date/Time   WBC 16.7 (H) 08/10/2021 2141   RBC 4.19 08/10/2021 2141   HGB 12.6 08/10/2021 2141   HGB 12.0 07/10/2021 1121   HCT 38.3 08/10/2021 2141   HCT 37.3 07/10/2021 1121   PLT 365 08/10/2021 2141   PLT 593 (H) 07/10/2021 1121   MCV 91.4 08/10/2021 2141   MCV 92 07/10/2021 1121   MCH 30.1 08/10/2021 2141    MCHC 32.9 08/10/2021 2141   RDW 13.6 08/10/2021 2141   RDW 12.4 07/10/2021 1121   LYMPHSABS 1.2 07/20/2021 1936   LYMPHSABS 1.3 07/10/2021 1121   MONOABS 0.6 07/20/2021 1936   EOSABS 0.0 07/20/2021 1936   EOSABS 0.1 07/10/2021 1121   BASOSABS 0.0 07/20/2021 1936   BASOSABS 0.0 07/10/2021 1121   CMP     Component Value Date/Time   NA 135 07/20/2021 1936   NA 137 07/10/2021 1121   K 4.3 07/20/2021 1936   CL 99 07/20/2021 1936   CO2 29 07/20/2021 1936   GLUCOSE 140 (H) 07/20/2021 1936   BUN 21 07/20/2021 1936   BUN 12 07/10/2021 1121   CREATININE 0.50 08/10/2021 2141   CREATININE 0.66 04/25/2021 1053   CALCIUM 9.1 07/20/2021 1936   PROT 6.6 07/20/2021 1936   PROT 6.9 01/18/2021 1219   ALBUMIN 3.6 07/20/2021 1936   ALBUMIN 3.8 01/18/2021 1219   AST 19 07/20/2021 1936   ALT 20 07/20/2021 1936   ALKPHOS 138 (H) 07/20/2021 1936   BILITOT 0.4 07/20/2021 1936   BILITOT 0.3 01/18/2021 1219   GFRNONAA >60 08/10/2021 2141   GFRNONAA 90 01/23/2018 0803   GFRAA 120 12/01/2020 1127   GFRAA 104 01/23/2018 0803   Lipid Panel     Component Value Date/Time   CHOL 267 (H) 06/26/2021 0427   CHOL 151 01/04/2020 0852   CHOL 216 (H) 03/09/2013 1358   TRIG 152 (H) 06/26/2021 0427   TRIG 208 (H) 03/09/2013 1358   HDL 49 06/26/2021 0427   HDL 47 01/04/2020 0852   HDL 43 03/09/2013 1358   CHOLHDL 5.4 06/26/2021 0427   VLDL 30 06/26/2021 0427   LDLCALC 188 (H) 06/26/2021 0427   LDLCALC 87 01/04/2020 0852   LDLCALC 69 01/23/2018 0803   LDLCALC 131 (H) 03/09/2013 1358   Lab Results  Component Value Date   HGBA1C 5.2 06/26/2021   HGBA1C 5.2 06/26/2021   Imaging I have reviewed the  images obtained:  MRI examination of the brain pending  Assessment: 75 y.o. female s/p ORIF with posterior lateral fusion T10-L2 on 08/10/2021 with concern for left facial asymmetry since 11/5. - Examination reveals patient with a mild left mouth asymmetry and proximal weakness of bilateral lower  extremities that varies between assessments with patient variable participation in examination. Initial NIHSS of 5. Assessment also limited with recent back surgery with patient complaints of pain with lower extremity straight leg elevation. - Due to patient's last known well being 3 days prior to assessment and assessment not concerning for an LVO with varying degrees of weakness, the Code Stroke was cancelled at 13:54 per Dr. Leonie Man and patient was scheduled for MRI brain imaging with vessel imaging to rule out possible subacute lacunar stroke. - Patient's stroke risk factors include advanced age, history of stroke, HLD, and HTN.   Recommendations: - MRI brain without contrast - MRA head and neck for vessel imaging - If imaging is negative for acute stroke, no further inpatient work up indicated at this time.  - With imaging evidence of acute stroke, will expand stroke work up at that time.   Anibal Henderson, AGAC-NP [facility] Pager: [phone]  STROKE MD NOTE : I have personally obtained history,examined this patient, reviewed notes, independently viewed imaging studies, participated in medical decision making and plan of care.ROS completed by me personally and pertinent positives fully documented  I have made any additions or clarifications directly to the above note. Agree with note above.  Patient underwent ORIF with posterior lateral fusion T10-L2 on 08/10/2021 for T12 compression fracture and was getting as needed pain medications.  Since Friday she has been little confused and as per family she had. Subtle leg weakness.  Today code stroke was called for left facial droop and leg weakness.  On exam patient does not have significant weakness on the face only mild asymmetry in leg weakness is proximally bilaterally which could be postop.  She has mild confusion which could be multifactorial due to postop situation and taking narcotics.  She is not a candidate for thrombolysis or  for emergent neurovascular imaging clinical exam not consistent with an LVO or major stroke.  Recommend continue plan for MRI scan and MRA of the brain and neck.  Treatment of back pain as per primary neurosurgery team.  Long discussion with bedside with patient's husband and answered questions.  Discussed with Dr. Christella Noa neurosurgery.  We will cancel code stroke versus evaluation for possible stroke with an MRI scan.  Further recommendations after MRI findings.  Greater than 50% time during this 80-minute consultation visit was spent in counseling and coordination of care about stroke in postoperative situation discussion with patient and husband and primary team and answering questions.  Antony Contras, MD Medical Director [facility] Pager: [phone] 08/13/2021 2:44 PM

## 2021-08-13 NOTE — Progress Notes (Addendum)
Initial Nutrition Assessment  DOCUMENTATION CODES:   Not applicable  INTERVENTION:  - Ensure Enlive po BID, each supplement provides 350 kcal and 20 grams of protein  - Continue MVI with minerals daily  NUTRITION DIAGNOSIS:   Increased nutrient needs related to post-op healing as evidenced by estimated needs.  GOAL:   Patient will meet greater than or equal to 90% of their needs  MONITOR:   PO intake, Supplement acceptance, Labs, Weight trends  REASON FOR ASSESSMENT:   Malnutrition Screening Tool    ASSESSMENT:   Pt admitted for posterior lateral fusion of T12-L2 d/t T12 compression fracture. PMH includes anemia, HTN, hyperlipidemia, and multiple strokes most recently 06/2021.  Attempted to see pt x2. Pt unavailable working with therapy and multiple providers present during visits d/t pt experiencing stroke-like symptoms during morning therapy.  Noted plans to transfer to CIR. No meal completions documented. Chart review shows consistent weight trends. During previous admission, pt was noted to have moderate malnutrition and received Ensure. Will place order and continue to follow up with pt.      Medications: calcium-vitamin D, vitamin D3, ferrous sulfate, lasix, heparin, mag-ox, MVI with minerals, prednisone, senokot  Labs: reviewed  NUTRITION - FOCUSED PHYSICAL EXAM:  Unable to complete at this time. Pt unavailable.  Diet Order:   Diet Order             Diet Heart Room service appropriate? Yes; Fluid consistency: Thin  Diet effective now                   EDUCATION NEEDS:   Not appropriate for education at this time  Skin:  Skin Assessment: Skin Integrity Issues: Skin Integrity Issues:: Incisions Incisions: back  Last BM:  PTA  Height:   Ht Readings from Last 1 Encounters:  08/10/21 5\' 5"  (1.651 m)    Weight:   Wt Readings from Last 1 Encounters:  08/10/21 59 kg    BMI:  Body mass index is 21.63 kg/m.  Estimated Nutritional Needs:    Kcal:  1600-1800  Protein:  80-90g  Fluid:  >1.6L  Clayborne Dana, RDN, LDN Clinical Nutrition

## 2021-08-14 DIAGNOSIS — S22080A Wedge compression fracture of T11-T12 vertebra, initial encounter for closed fracture: Secondary | ICD-10-CM

## 2021-08-14 NOTE — Care Management Important Message (Signed)
Important Message  Patient Details  Name: Mckenzie [name] MRN: [id number] Date of Birth: [birth year]   Medicare Important Message Given:        [name] 08/14/2021, 2:53 PM

## 2021-08-14 NOTE — Progress Notes (Signed)
[name], female   DOB: Sep 12, 1946, 75 y.o.   MRN: [id number] Alert, oriented to person, people, family members Holding narcotics Moving extremities Will consult rehab per pt recommendations.

## 2021-08-14 NOTE — Progress Notes (Signed)
Physical Therapy Treatment Patient Details Name: Mckenzie [name] MRN: [id number] DOB: 1946/07/04 Today's Date: 08/14/2021   History of Present Illness Pt is a 75 y/o female who presents with intractable pain due to nonhealing T12 fracture. She underwent ORIF T12 fracture with posterior lateral fusion T10-L2 on 08/10/2021. She has sustained, since March of this year< 4 compression fractures in the thoracic spine; T1, T4, T11, T12. 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    PT Comments    Pt continues to demonstrate decreased cognition, limiting ability to perform OOB mobility at this time. Pt was able to perform ADL task in bed of wiping face with washcloth, however required up to max assist to initiate, and complete. Pt wiping full mouth area but otherwise only wiping R side of face. Pt unable to hand me the washcloth to the L. Grossly with continued L side inattention however slightly improved from yesterday. Husband present and asking appropriate questions regarding care for home and role of rehab. Continue to recommend inpatient rehab to decrease burden of care on family prior to return home. Will continue to follow and progress as able per POC.     Recommendations for follow up therapy are one component of a multi-disciplinary discharge planning process, led by the attending physician.  Recommendations may be updated based on patient status, additional functional criteria and insurance authorization.  Follow Up Recommendations  Acute inpatient rehab (3hours/day)     Assistance Recommended at Discharge Frequent or constant Supervision/Assistance  Equipment Recommendations  Wheelchair (measurements PT);Other (comment) [facility] bed)    Recommendations for Other Services Rehab consult     Precautions / Restrictions Precautions Precautions: Fall;Back Precaution Booklet Issued: No Precaution Comments: Pt was cued for  maintenance of back precautions during functional mobility. Required Braces or Orthoses: Spinal Brace Spinal Brace: Lumbar corset Restrictions Weight Bearing Restrictions: No     Mobility  Bed Mobility Overal bed mobility: Needs Assistance Bed Mobility: Rolling Rolling: Total assist         General bed mobility comments: Assist for all aspects of bed mobility.    Transfers                   General transfer comment: Deferred further mobility    Ambulation/Gait               General Gait Details: Unable to progress to gait training at this time.   Stairs             Wheelchair Mobility    Modified Rankin (Stroke Patients Only) Modified Rankin (Stroke Patients Only) Pre-Morbid Rankin Score: Moderately severe disability Modified Rankin: Severe disability     Balance                                            Cognition Arousal/Alertness: Awake/alert Behavior During Therapy: Flat affect Overall Cognitive Status: Impaired/Different from baseline Area of Impairment: Attention;Memory;Following commands;Safety/judgement;Awareness;Problem solving;Orientation                 Orientation Level:  (Appeared generally confused however oriented x4 when specifically asked) Current Attention Level: Focused Memory: Decreased recall of precautions Following Commands: Follows one step commands inconsistently;Follows one step commands with increased time Safety/Judgement: Decreased awareness of safety;Decreased awareness of deficits Awareness: Intellectual Problem Solving: Slow processing;Difficulty sequencing;Requires verbal cues;Decreased initiation  General Comments: Continues to have L side inattention.        Exercises      General Comments        Pertinent Vitals/Pain Pain Assessment: Faces Faces Pain Scale: Hurts even more Breathing: normal Negative Vocalization: none Facial Expression: smiling or inexpressive Body  Language: relaxed Consolability: no need to console PAINAD Score: 0 Pain Location: Back Pain Descriptors / Indicators: Operative site guarding;Sore Pain Intervention(s): Limited activity within patient's tolerance;Monitored during session;Repositioned    Home Living                          Prior Function            PT Goals (current goals can now be found in the care plan section) Acute Rehab PT Goals Patient Stated Goal: Family's goal is to get pt back home PT Goal Formulation: Patient unable to participate in goal setting Time For Goal Achievement: 08/25/21 Potential to Achieve Goals: Fair Progress towards PT goals: Progressing toward goals    Frequency    Min 5X/week      PT Plan Current plan remains appropriate    Co-evaluation              AM-PAC PT "6 Clicks" Mobility   Outcome Measure  Help needed turning from your back to your side while in a flat bed without using bedrails?: Total Help needed moving from lying on your back to sitting on the side of a flat bed without using bedrails?: Total Help needed moving to and from a bed to a chair (including a wheelchair)?: Total Help needed standing up from a chair using your arms (e.g., wheelchair or bedside chair)?: Total Help needed to walk in hospital room?: Total Help needed climbing 3-5 steps with a railing? : Total 6 Click Score: 6    End of Session Equipment Utilized During Treatment: Gait belt;Back brace Activity Tolerance: Patient limited by lethargy Patient left: in bed;with call bell/phone within reach;with bed alarm set;with family/visitor present Nurse Communication: Mobility status PT Visit Diagnosis: Unsteadiness on feet (R26.81);Pain;Muscle weakness (generalized) (M62.81) Pain - part of body:  (back)     Time: 1430-1502 PT Time Calculation (min) (ACUTE ONLY): 32 min  Charges:  $Therapeutic Activity: 8-22 mins $Self Care/Home Management: 8-22                     Mckenzie [name], PT, DPT Acute Rehabilitation Services Pager: [phone] Office: [phone]    Mckenzie [name] 08/14/2021, 4:03 PM

## 2021-08-14 NOTE — Plan of Care (Signed)
  Problem: Clinical Measurements: Goal: Will remain free from infection Outcome: Progressing Goal: Diagnostic test results will improve Outcome: Progressing Goal: Respiratory complications will improve Outcome: Progressing   Problem: Activity: Goal: Risk for activity intolerance will decrease Outcome: Progressing   

## 2021-08-14 NOTE — Progress Notes (Addendum)
Inpatient Rehab Admissions Coordinator:   I do not have a bed for this Pt. Today. It is not clear that Pt. Is able to tolerate CIR at this time; however, CIR will follow for potential admit if participation improves. If tolerance does not improve, would recommend rehab at another venue.   Addendum: I discussed barriers with Pt.'s husband and daughter and they are in agreement that pt. Would not be able to tolerate intensity of CIR at this time. They adamantly refuse SNF, but Pt.'s husband is not able to care her her at current assist level. They would like to give Pt. A few days to see if her ability to participate with therapy improves.    Clemens Catholic, [place], [place] Admissions Coordinator  [phone] ([place]) [phone] (office)

## 2021-08-14 NOTE — Progress Notes (Signed)
Stroke Team Progress Note  SUBJECTIVE Her husband is at the bedside.  Patient remains slightly confused and disoriented but leg weakness and facial weakness has improved back to baseline.  MRI scan of the brain shows no acute infarct.  Patient's husband informs me that she has had similar episodes of confusion and disorientation following prior hospital admissions as well.   OBJECTIVE Most recent Vital Signs: Temp: 98 F (36.7 C) (11/08 1133) Temp Source: Oral (11/08 1133) BP: 121/61 (11/08 1133) Pulse Rate: 92 (11/08 1133) Respiratory Rate: 20 O2 Saturdation: 95%  CBG (last 3)  Recent Labs    08/13/21 1410  GLUCAP 161*       Studies:  CMP significant only for low potassium of 3.4 MRI no acute abnormality CBC significant only for total white count of 10.0.  ECHO** LDL not done HbA1c not done CT angiogram not done Physical Exam:   Awake, alert, in no acute distress Psych: Affect appropriate for situation, patient is calm and cooperative with examination Head: Normocephalic and atraumatic, without obvious abnormality EENT: Normal conjunctivae, dry mucous membranes, no OP obstruction. Extremely hard of hearing.  LUNGS: Normal respiratory effort. Non-labored breathing on room air. CV: Regular rate and rhythm on telemetry ABDOMEN: Soft, non-tender, non-distended Extremities: warm, well perfused, without obvious deformity. Left upper extremity with extensive ecchymosis at the forearm.   NEURO:  Mental Status: Awake, alert, and oriented to self, age, month, and place.  She is unable to provide a clear and coherent history of present illness and her husband at bedside provides most of the history. She is pleasantly confused and hard of hearing.  Speech/Language: speech is intact without dysarthria.  Naming and comprehension are intact.  No neglect is noted.  Diminished attention, registration and recall. Cranial Nerves:  II: PERRL. Visual fields full.  III, IV, VI: EOMI without  ptosis, nystagmus, or gaze preference.  V: Sensation is intact to light touch and symmetrical to face.  VII: Face is mildly asymmetric with mild left mouth drooping.  At rest but it improves with good effort. VIII: Hearing intact to loud voice, patient is extremely hard of hearing at baseline.  IX, X: Palate elevation is symmetric. Phonation normal.  XI: Normal sternocleidomastoid and trapezius muscle strength XII: Tongue protrudes midline without fasciculations.   Motor: 5/5 strength present in bilateral upper extremities without vertical drift and proximally in bilateral lower extremities with 5/5 ankle plantar and dorsiflexion. Bilateral lower extremities proximally are diffusely weak with immediate drift to bed on assessment and minimal attempt at antigravity movement.  Effort is variable.  Good distal strength bilaterally.  Trace weakness of left ankle dorsiflexors. Tone is normal. Bulk is normal.  Sensation: Intact to light touch bilaterally in all four extremities. No extinction to DSS present.  Coordination: FTN intact bilaterally. Unable to assess HKS due to diffuse weakness. DTRs: 3+ and symmetric throughout Gait: Deferred    ASSESSMENT Ms. DANILA EDDIE is a 75 y.o. female with a ORIF with posterior lateral 8-L2 on 08/10/2021 for T12 compression fracture who develops confusion and slight leg weakness on 08/13/2021 his neurological exam shows improvement in subjective weakness and MRI scan shows no acute bleed likely has mild confusion which is due to postoperative delirium due to underlying mild cognitive impairment with medication effect from narcotics.     Hospital day # 4  TREATMENT/PLAN  I have personally examined this patient, reviewed notes, independently viewed imaging studies, participated in medical decision making and plan of care.ROS completed by me  personally and pertinent positives fully documented  I have made any additions or clarifications directly to the above  note.  Recommend hold narcotic and other CNS active medications.  Mobilize out of bed.  No further stroke is necessary.  Long discussion with patient and husband at the bedside and answered questions.  Stroke team will sign off.  Kindly call for questions I spent 25  minutes in total face-to-face time with the patient, more than 50% of which was spent in counseling and coordination of care, reviewing test results, reviewing medication and discussing or reviewing the diagnosis of    , the prognosis and treatment options.   Antony Contras, MD Medical Director [facility] Pager: [phone] 08/14/2021 2:59 PM

## 2021-08-15 ENCOUNTER — Encounter: Payer: Self-pay | Admitting: Family Medicine

## 2021-08-15 DIAGNOSIS — S22080D Wedge compression fracture of T11-T12 vertebra, subsequent encounter for fracture with routine healing: Secondary | ICD-10-CM

## 2021-08-15 NOTE — Care Management Important Message (Signed)
Important Message  Patient Details  Name: Mckenzie [name] MRN: [id number] Date of Birth: 1946-03-25   Medicare Important Message Given:  Yes     [name] 08/15/2021, 2:30 PM

## 2021-08-15 NOTE — Progress Notes (Signed)
Physical Therapy Treatment Patient Details Name: Mckenzie [name] MRN: [id number] DOB: Jan 12, 1946 Today's Date: 08/15/2021   History of Present Illness Pt is a 75 y/o female who presents with intractable pain due to nonhealing T12 fracture. She underwent ORIF T12 fracture with posterior lateral fusion T10-L2 on 08/10/2021. She has sustained, since March of this year< 4 compression fractures in the thoracic spine; T1, T4, T11, T12. 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    PT Comments    Pt's delirium and lethargy continues to limit mobility and participation with PT. Family continues to adamantly decline SNF level rehab, however feel this option would provide the safest level of care for the pt as she is currently requiring +2 total assist for all aspects of mobility. If family ultimately decides to take her home, recommend ambulance transfer home, hospital bed, hoyer lift, wheelchair, and home health aide/therapies.  Will continue to follow.    Recommendations for follow up therapy are one component of a multi-disciplinary discharge planning process, led by the attending physician.  Recommendations may be updated based on patient status, additional functional criteria and insurance authorization.  Follow Up Recommendations  Skilled nursing-short term rehab (<3 hours/day)     Assistance Recommended at Discharge Frequent or constant Supervision/Assistance  Equipment Recommendations  Wheelchair (measurements PT);Other (comment) [facility] bed, hoyer lift if returning home)    Recommendations for Other Services Rehab consult     Precautions / Restrictions Precautions Precautions: Fall;Back Precaution Booklet Issued: No Precaution Comments: Pt was cued for maintenance of back precautions during functional mobility. Required Braces or Orthoses: Spinal Brace Spinal Brace: Lumbar corset Restrictions Weight Bearing  Restrictions: No     Mobility  Bed Mobility Overal bed mobility: Needs Assistance Bed Mobility: Rolling;Sidelying to Sit;Sit to Sidelying Rolling: Total assist;+2 for physical assistance Sidelying to sit: Total assist;+2 for physical assistance     Sit to sidelying: Total assist;+2 for physical assistance General bed mobility comments: Assist for all aspects of bed mobility. Pt with eyes closed initially until sitting up EOB. She was able to maintain eyes open for ~8 minutes while sitting.    Transfers                   General transfer comment: Deferred further mobility due to lethargy.    Ambulation/Gait               General Gait Details: Unable to progress to gait training at this time.   Stairs             Wheelchair Mobility    Modified Rankin (Stroke Patients Only) Modified Rankin (Stroke Patients Only) Pre-Morbid Rankin Score: Moderately severe disability Modified Rankin: Severe disability     Balance Overall balance assessment: Needs assistance Sitting-balance support: Feet unsupported;Bilateral upper extremity supported Sitting balance-Leahy Scale: Zero Sitting balance - Comments: Heavy R lateral lean and requiring max assist at times to maintain sitting balance. Postural control: Right lateral lean     Standing balance comment: Unable to assess                            Cognition Arousal/Alertness: Awake/alert Behavior During Therapy: Flat affect Overall Cognitive Status: Impaired/Different from baseline Area of Impairment: Attention;Memory;Following commands;Safety/judgement;Awareness;Problem solving;Orientation                 Orientation Level:  (Appeared generally confused however oriented x4 when  specifically asked) Current Attention Level: Focused Memory: Decreased recall of precautions Following Commands: Follows one step commands inconsistently;Follows one step commands with increased  time Safety/Judgement: Decreased awareness of safety;Decreased awareness of deficits Awareness: Intellectual Problem Solving: Slow processing;Difficulty sequencing;Requires verbal cues;Decreased initiation General Comments: Continues to have L side inattention        Exercises      General Comments        Pertinent Vitals/Pain Pain Assessment: Faces Faces Pain Scale: Hurts even more Breathing: normal Negative Vocalization: none Facial Expression: smiling or inexpressive Body Language: relaxed Consolability: no need to console PAINAD Score: 0 Pain Location: Back during movement Pain Descriptors / Indicators: Operative site guarding;Sore Pain Intervention(s): Limited activity within patient's tolerance;Monitored during session;Repositioned    Home Living Family/patient expects to be discharged to:: Private residence Living Arrangements: Spouse/significant other Available Help at Discharge: Family;Available 24 hours/day Type of Home: House Home Access: Ramped entrance       Home Layout: One level   Additional Comments: Husband ordered and installed a grab bar at tub that is also within reach of toilet to assist with transfers    Prior Function            PT Goals (current goals can now be found in the care plan section) Acute Rehab PT Goals Patient Stated Goal: Family's goal is to get pt back home PT Goal Formulation: Patient unable to participate in goal setting Time For Goal Achievement: 08/25/21 Potential to Achieve Goals: Fair Progress towards PT goals: Not progressing toward goals - comment    Frequency    Min 3X/week      PT Plan Discharge plan needs to be updated;Frequency needs to be updated    Co-evaluation              AM-PAC PT "6 Clicks" Mobility   Outcome Measure  Help needed turning from your back to your side while in a flat bed without using bedrails?: Total Help needed moving from lying on your back to sitting on the side of a  flat bed without using bedrails?: Total Help needed moving to and from a bed to a chair (including a wheelchair)?: Total Help needed standing up from a chair using your arms (e.g., wheelchair or bedside chair)?: Total Help needed to walk in hospital room?: Total Help needed climbing 3-5 steps with a railing? : Total 6 Click Score: 6    End of Session   Activity Tolerance: Patient limited by lethargy Patient left: in bed;with call bell/phone within reach;with bed alarm set;with family/visitor present Nurse Communication: Mobility status PT Visit Diagnosis: Unsteadiness on feet (R26.81);Pain;Muscle weakness (generalized) (M62.81) Pain - part of body:  (back)     Time: 5364-6803 PT Time Calculation (min) (ACUTE ONLY): 22 min  Charges:  $Therapeutic Activity: 8-22 mins                     Rolinda Roan, PT, DPT Acute Rehabilitation Services Pager: [phone] Office: [phone]    Thelma Comp 08/15/2021, 3:25 PM

## 2021-08-15 NOTE — Plan of Care (Signed)
  Problem: Coping: Goal: Level of anxiety will decrease Outcome: Progressing   Problem: Elimination: Goal: Will not experience complications related to bowel motility Outcome: Progressing Goal: Will not experience complications related to urinary retention Outcome: Progressing   

## 2021-08-15 NOTE — Progress Notes (Signed)
Inpatient Rehab Admissions Coordinator:   Pt. Continues to require total assist for all aspects of bed mobility and has been unable to tolerate OOB for several days. I do not believe Pt. Will be able to tolerate the intensity of CIR unless delirium resolves and participation improves. I also would not be able to obtain insurance authorization for this pt. For a patient who is total assist. Even if Pt. Does improve and participate, it will likely take ~7 days to get insurance approval, as pt.'s diagnosis typically requires [place] to go through an appeal process with [facility] Medicare. Family is aware of the barriers to getting Pt. Into rehab and is open to taking her home with a hospital bed and with a catheter (Pt. Worries he will not be able to toilet patient safely due to her spinal precautions. )  Note that a second consult order was placed by attending MD. I have requested rehab MD review patient's case and make a determination about appropriateness for rehab.     Clemens Catholic, [place], [place] Admissions Coordinator  [phone] ([place]) [phone] (office)

## 2021-08-15 NOTE — Consult Note (Signed)
Physical Medicine and Rehabilitation Consult Reason for Consult: Assess candidacy for inpatient rehab Referring Physician: Ashok Pall, MD   HPI: Mckenzie [name] is a 75 y.o. female who initially presented with intractable pain due to a nonhealing T12 fracture. She underwent ORIF of the T12 fracture with posterior lateral fusion of T10-L2 on 08/10/21. Since March of this year she has sustained 4 fractures of the thoracic spine: T1, T4, T11, and T12. As per husband she was walking 5 miles per day 1 year ago but health and mobility significantly declined in the month prior to admission. Currently with suspected delirium- husband notes this has happened to her before in the hospital and resolved quickly upon return to home. Physical Medicine & Rehabilitation was consulted to assess candidacy for CIR.     ROS Unable to obtain due to patient's somnolence. As per husband, with delirium and frequent urination at night at home (up to 20 times)  Past Medical History:  Diagnosis Date   Anemia    low iron   Arthritis    Depression    High cholesterol    Hypertension    Retinal micro-aneurysm of right eye    Stroke ([place])    2 or 3 mini strokes   Past Surgical History:  Procedure Laterality Date   ARTERY BIOPSY Bilateral 12/15/2020   Procedure: BILATERAL TEMPORAL ARTERY BIOPSIES;  Surgeon: Angelia Mould, MD;  Location: [facility] OR;  Service: Vascular;  Laterality: Bilateral;   COLONOSCOPY WITH PROPOFOL N/A 12/09/2020   Procedure: COLONOSCOPY WITH PROPOFOL;  Surgeon: Gatha Mayer, MD;  Location: [facility] ENDOSCOPY;  Service: Endoscopy;  Laterality: N/A;   ESOPHAGOGASTRODUODENOSCOPY (EGD) WITH PROPOFOL N/A 12/09/2020   Procedure: ESOPHAGOGASTRODUODENOSCOPY (EGD) WITH PROPOFOL;  Surgeon: Gatha Mayer, MD;  Location: [place];  Service: Endoscopy;  Laterality: N/A;   EYE SURGERY     FINGER SURGERY Left    RING FINGER   IR THORACENTESIS ASP PLEURAL SPACE W/IMG GUIDE  12/13/2020    OPEN REDUCTION INTERNAL FIXATION (ORIF) DISTAL RADIAL FRACTURE Left 10/05/2020   Procedure: OPEN REDUCTION INTERNAL FIXATION (ORIF) DISTAL RADIAL FRACTURE;  Surgeon: Shona Needles, MD;  Location: [place];  Service: Orthopedics;  Laterality: Left;  supraclavicular block   POLYPECTOMY  12/09/2020   Procedure: POLYPECTOMY;  Surgeon: Gatha Mayer, MD;  Location: [facility] ENDOSCOPY;  Service: Endoscopy;;   SPINAL FUSION  08/10/2021   T10-L2   TUBAL LIGATION     Family History  Problem Relation Age of Onset   Hyperlipidemia Mother    Hypertension Mother    Diabetes Mother    COPD Father    Depression Sister    Suicidality Brother    Drug abuse Brother    Healthy Daughter    Healthy Daughter    Healthy Daughter    Colon cancer Neg Hx    Celiac disease Neg Hx    Inflammatory bowel disease Neg Hx    Social History:  reports that she quit smoking about 40 years ago. Her smoking use included cigarettes. She has a 0.10 pack-year smoking history. She has never used smokeless tobacco. She reports that she does not drink alcohol and does not use drugs. Allergies:  Allergies  Allergen Reactions   Aldomet [Methyldopa] Other (See Comments)    Flu-like symptoms   Fosamax [Alendronate Sodium] Other (See Comments)    Weakness/fatigue    Plavix [Clopidogrel] Other (See Comments)    Arm swelled, bleeding   Acetazolamide Rash  Buspar [Buspirone] Palpitations   Medications Prior to Admission  Medication Sig Dispense Refill   acetaminophen (TYLENOL) 325 MG tablet Take 2 tablets (650 mg total) by mouth every 6 (six) hours as needed for mild pain (or Fever >/= 101).     amLODipine (NORVASC) 5 MG tablet Take 1 tablet (5 mg total) by mouth daily. 90 tablet 1   aspirin EC 81 MG EC tablet Take 1 tablet (81 mg total) by mouth daily. X one month, then stop 30 tablet 0   atorvastatin (LIPITOR) 40 MG tablet Take 1 tablet (40 mg total) by mouth daily. 30 tablet 1   Bromfenac Sodium 0.07 % SOLN Place 1 drop into  the right eye every evening.     Calcium Carb-Cholecalciferol (CALCIUM+D3 PO) Take 2 tablets by mouth in the morning.     COMBIGAN 0.2-0.5 % ophthalmic solution Place 1 drop into both eyes every 12 (twelve) hours.     dorzolamide (TRUSOPT) 2 % ophthalmic solution Place 1 drop into the left eye 2 (two) times daily.     Ergocalciferol (VITAMIN D2) 50 MCG (2000 UT) TABS Take 1 tablet by mouth daily. 30 tablet 6   escitalopram (LEXAPRO) 20 MG tablet Take 1 tablet (20 mg total) by mouth daily. 90 tablet 1   fluorometholone (FML) 0.1 % ophthalmic suspension Place 1 drop into the right eye 2 (two) times daily.     furosemide (LASIX) 20 MG tablet Take 10 mg by mouth in the morning.     HYDROcodone-acetaminophen (NORCO/VICODIN) 5-325 MG tablet Take 1 tablet by mouth every 6 (six) hours as needed for severe pain. 120 tablet 0   latanoprost (XALATAN) 0.005 % ophthalmic solution Place 1 drop into both eyes at bedtime.     lisinopril (ZESTRIL) 10 MG tablet Take 1 tablet (10 mg total) by mouth daily. 30 tablet 2   magnesium oxide (MAG-OX) 400 MG tablet Take 400 mg by mouth in the morning.     Multiple Vitamin (MULTIVITAMIN WITH MINERALS) TABS tablet Take 1 tablet by mouth in the morning. One A Day Multivitamin     predniSONE (DELTASONE) 5 MG tablet Take 3 tablets (15 mg total) by mouth daily with breakfast for 14 days, THEN 2 tablets (10 mg total) daily with breakfast for 14 days, THEN 1.5 tablets (7.5 mg total) daily with breakfast for 14 days, THEN 1 tablet (5 mg total) daily with breakfast for 14 days. 105 tablet 0   solifenacin (VESICARE) 10 MG tablet Take 1 tablet (10 mg total) by mouth daily. 30 tablet 11   ferrous sulfate 325 (65 FE) MG tablet Take 325 mg by mouth 3 (three) times a week.     [START ON 09/01/2021] HYDROcodone-acetaminophen (NORCO/VICODIN) 5-325 MG tablet Take 1 tablet by mouth every 6 (six) hours as needed for severe pain. (Patient not taking: No sig reported) 120 tablet 0   [START ON  10/01/2021] HYDROcodone-acetaminophen (NORCO/VICODIN) 5-325 MG tablet Take 1 tablet by mouth every 6 (six) hours as needed for severe pain. (Patient not taking: No sig reported) 120 tablet 0    Home: Home Living Family/patient expects to be discharged to:: Private residence Living Arrangements: Spouse/significant other Available Help at Discharge: Family, Available 24 hours/day Type of Home: House Home Access: [place]: One level Bathroom Shower/Tub: Chiropodist: Handicapped height Bathroom Accessibility: Yes Home Equipment: Conservation officer, nature (2 wheels), BSC, Shower seat, Grab bars - tub/shower Additional Comments: Husband ordered and installed a grab bar at tub  that is also within reach of toilet to assist with transfers  Lives With: Spouse  Functional History: Prior Function Prior Level of Function : Needs assist Physical Assist : Mobility (physical), ADLs (physical) Mobility (physical): Bed mobility, Transfers, Gait ADLs (physical): Bathing, Dressing, Toileting Mobility Comments: Husband reports pt at a transfers only level at home and has not been ambulating ADLs Comments: Husband assisting with bathing and dressing. She gets on the [facility] with a SPT with the RW and he assists from there for bathing. Functional Status:  Mobility: Bed Mobility Overal bed mobility: Needs Assistance Bed Mobility: Rolling, Sidelying to Sit, Sit to Sidelying Rolling: Total assist, +2 for physical assistance Sidelying to sit: Total assist, +2 for physical assistance Sit to sidelying: Total assist, +2 for physical assistance General bed mobility comments: Assist for all aspects of bed mobility. Pt with eyes closed initially until sitting up EOB. She was able to maintain eyes open for ~8 minutes while sitting. Transfers Overall transfer level: Needs assistance Equipment used: Rolling walker (2 wheels) Transfers: Sit to/from Stand, Stand Pivot Transfers Sit to Stand:  Min assist, +2 physical assistance Stand pivot transfers: Mod assist, +2 physical assistance General transfer comment: Deferred further mobility due to lethargy. Ambulation/Gait General Gait Details: Unable to progress to gait training at this time.    ADL: ADL Overall ADL's : Needs assistance/impaired Eating/Feeding: Set up, Sitting Grooming: Minimal assistance, Bed level Grooming Details (indicate cue type and reason): able to wash face with setup. Min A for full grooming session d/t decreased activity tolerance Upper Body Bathing: Moderate assistance, Sitting Lower Body Bathing: Moderate assistance, Sit to/from stand, +2 for physical assistance Upper Body Dressing : Maximal assistance Lower Body Dressing: Moderate assistance, +2 for physical assistance, Sit to/from stand Toilet Transfer: Moderate assistance, +2 for physical assistance, Stand-pivot, BSC, Rolling walker (2 wheels) Toileting- Clothing Manipulation and Hygiene: Moderate assistance, +2 for physical assistance, Sit to/from stand General ADL Comments: Pt completed bed mobility, sat EOB a few minutes, washed face with setup. Pt then stood and took pivotal steps to sit up in recliner. +2 assist with transfers. Spouse present, daughter on speakerphone at end of session.  Cognition: Cognition Overall Cognitive Status: Impaired/Different from baseline Orientation Level: Oriented to person Cognition Arousal/Alertness: Awake/alert Behavior During Therapy: Flat affect Overall Cognitive Status: Impaired/Different from baseline Area of Impairment: Attention, Memory, Following commands, Safety/judgement, Awareness, Problem solving, Orientation Orientation Level:  (Appeared generally confused however oriented x4 when specifically asked) Current Attention Level: Focused Memory: Decreased recall of precautions Following Commands: Follows one step commands inconsistently, Follows one step commands with increased time Safety/Judgement:  Decreased awareness of safety, Decreased awareness of deficits Awareness: Intellectual Problem Solving: Slow processing, Difficulty sequencing, Requires verbal cues, Decreased initiation General Comments: Continues to have L side inattention  Blood pressure (!) 106/50, pulse 87, temperature 98.7 F (37.1 C), resp. rate 18, height 5\' 5"  (1.651 m), weight 59 kg, SpO2 96 %. Physical Exam Gen: sleeping HEENT: oral mucosa pink and moist, NCAT Cardio: Reg rate Chest: normal effort, normal rate of breathing Abd: soft, non-distended Ext: no edema Psych: pleasant, normal affect Skin: intact Neuro: Unable to perform due to patient's somnolence  No results found for this or any previous visit (from the past 24 hour(s)). No results found.  Assessment/Plan: 1) s/ ORIF of T12 fracture with posterior lateral fusion T10-L2 on 08/10/21. 2) delirium 3) impaired mobility and ADLs -Mrs. Newborn is currently total assist for bed mobility and unfortunately would be unable to tolerate 3  hours of daily intensive rehab in CIR. I discussed this with our admission coordinator, the patient's husband, her daughter, and the case manager who all are in agreement. She has a supportive husband and two daughters who live very close by and have been regularly visiting her and they would like to take her home. Given her prior experiences of delirium in the hospital that resolved soon after returning home, I agree that this would be the best setting for her, and hopefully as her delirium improves she will be able to tolerate progressively more therapy. Her husband notes frequent urination at home (up to 20 times per night) despite pharmacologic management by urology and would like her to go home with a foley catheter given her current mobility limitations. I agree this would be a good temporary option for her. She would benefit from home PT, OT, and SLP, as well as nursing services for foley catheter management. Her case manager  suggested palliative/hospice care and I agree that a consult for palliative care could be beneficial for the family to learn about what services they can provide to ease her transition home. I discussed this with husband and daughter and they are agreeable. She would also benefit from a hospital bed- discussed with case manager.  -I have provided the daughter with my phone number should she have any more questions or if I can be of any help to them.  Thank you for this consult Leeroy Cha, MD  Izora Ribas, MD 08/15/2021   >70 minutes spent in review of patient's chart, and in discussion of patient's care and disposition with our admission coordinator, patient's husband, patient's daughter, and patient's case manager, educating regarding home services options, communicating with patient's physical therapists to provide husband and daughters with a home exercise program for patient upon discharge.

## 2021-08-15 NOTE — TOC Initial Note (Addendum)
Transition of Care [facility]) - Initial/Assessment Note    Patient Details  Name: Mckenzie [name] MRN: [id number] Date of Birth: 1946/09/14  [facility]) CM/SW Contact:    Carles Collet, RN Phone Number: 08/15/2021, 3:13 PM  Clinical Narrative:       Met with patient's spouse at bedside. Patient was asleep in bed. Spouse was concurrently meeting with CIR MD who was explaining that patient was not a candidate for CIR. Discussion was being made for DC plan, leaning towards home in some capacity as a facility would likely add to patient's confusion.           Spouse would like patient to DC to home with a foley catheter as he is not sure that he can safely assist patient to the bathroom. Discussed with him her increased risk of UTI with an indwelling catheter and that it would have to be ordered by Dr Christella Noa, her attending MD.    We identified potential DME needs for patient returning home. Hospital bed, wheelchair, hoyer lift. Patient has RW already at home.   He discussed home support systems, namely HH, outpatient palliative, and home hospice. MD at bedside in agreement that Palliative Care Team Referral before discharge would be beneficial to help determine most appropriate level of support.   Anticipate HH services with outpatient palliative support, however will defer [facility] referral and ordering DME until determination has been made with Palliative Care Team assistance. Please order palliative consult.   Expected Discharge Plan: [place] Barriers to Discharge: Continued Medical Work up   Patient Goals and CMS Choice Patient states their goals for this hospitalization and ongoing recovery are:: to return home      Expected Discharge Plan and Services Expected Discharge Plan: [place]   Discharge Planning Services: CM Consult Post Acute Care Choice: Durable Medical Equipment Living arrangements for the past 2 months: Single Family Home                                       Prior Living Arrangements/Services Living arrangements for the past 2 months: Single Family Home Lives with:: Spouse              Current home services: DME    Activities of Daily Living Home Assistive Devices/Equipment: Environmental consultant (specify type) ADL Screening (condition at time of admission) Patient's cognitive ability adequate to safely complete daily activities?: No Is the patient deaf or have difficulty hearing?: Yes Does the patient have difficulty seeing, even when wearing glasses/contacts?: Yes Does the patient have difficulty concentrating, remembering, or making decisions?: Yes Patient able to express need for assistance with ADLs?: Yes Does the patient have difficulty dressing or bathing?: Yes Independently performs ADLs?: No Communication: Independent Dressing (OT): Needs assistance Is this a change from baseline?: Change from baseline, expected to last <3days Grooming: Needs assistance Is this a change from baseline?: Change from baseline, expected to last <3 days Feeding: Independent Is this a change from baseline?: Change from baseline, expected to last <3 days Bathing: Needs assistance Is this a change from baseline?: Change from baseline, expected to last <3 days Toileting: Needs assistance Is this a change from baseline?: Change from baseline, expected to last <3 days In/Out Bed: Dependent Is this a change from baseline?: Change from baseline, expected to last <3 days Walks in Home: Needs assistance Is this a  change from baseline?: Pre-admission baseline Does the patient have difficulty walking or climbing stairs?: Yes Weakness of Legs: Both Weakness of Arms/Hands: Both  Permission Sought/Granted                  Emotional Assessment              Admission diagnosis:  Compression fracture of T12 vertebra ([place]) [S22.080A] Patient Active Problem List   Diagnosis Date Noted   Compression fracture of T12  vertebra ([place]) 08/10/2021   Hematoma of arm, left, subsequent encounter 07/24/2021   TIA (transient ischemic attack) 06/26/2021   Hypokalemia 06/26/2021   Acute CVA (cerebrovascular accident) ([place]) 06/26/2021   Compression fracture of T1 vertebra ([place]) 05/25/2021   Compression fracture of T4 vertebra ([place]) 05/25/2021   Fracture of sacrum ([place]) 05/25/2021   Fracture of manubrium 05/25/2021   Low back pain 05/23/2021   Depression 01/23/2021   High risk medication use 01/02/2021   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   Pericardial effusion 12/27/2020   Abnormal MRI, cervical spine 12/27/2020   Generalized weakness 12/27/2020   Steroid-induced hyperglycemia 12/27/2020   Malnutrition of moderate degree 12/11/2020   Pressure injury of skin 12/09/2020   Benign neoplasm of cecum    Benign neoplasm of descending colon    Hiatal hernia    Hyponatremia 12/08/2020   Thrombocytosis 12/08/2020   Hypoalbuminemia 12/08/2020   Memory loss 12/08/2020   Constipation 12/06/2020   Anemia of chronic disease 12/06/2020   Gait disturbance 11/14/2020   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 11/14/2020   Cognitive change 11/14/2020   Adjustment disorder with mixed anxiety and depressed mood 10/10/2020   Fracture 10/06/2020   Distal radius fracture 28/20/8138   Acute metabolic encephalopathy 87/19/5974   Proximal weakness of extremity 09/26/2020   Myalgia 09/26/2020   Fall at home, subsequent encounter 09/26/2020   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 [place] [address] Phone: [phone] Fax: [place], [place]  [place] [address] Phone: [phone] Fax: [phone]     Social Determinants of Health (SDOH) Interventions    Readmission Risk Interventions No flowsheet data found.

## 2021-08-15 NOTE — Progress Notes (Signed)
[name], female   DOB: 1945/10/22, 75 y.o.   MRN: [id number] BP (!) 139/53   Pulse 89   Temp 97.8 F (36.6 C) (Oral)   Resp 18   Ht 5\' 5"  (1.651 m)   Wt 59 kg   SpO2 96%   BMI 21.63 kg/m  Alert, oriented to person, daughter Will continue in hospital, mental status much better today, than yesterday.  With better mentation activity may very well increase.

## 2021-08-16 NOTE — Progress Notes (Signed)
IP rehab admissions - Please see rehab consult done by Dr. Ranell Patrick yesterday.  Patient cannot tolerate or participate in 3 hours of therapy a day.  Patient is not at candidate for inpatient rehab program at this time.  I will sign off for inpatient rehab admissions at this time.  Call for questions.  [phone]

## 2021-08-16 NOTE — Plan of Care (Signed)
  Problem: Education: Goal: Knowledge of General Education information will improve Description: Including pain rating scale, medication(s)/side effects and non-pharmacologic comfort measures 08/16/2021 1634 by Bess Harvest, RN Outcome: Progressing 08/16/2021 1634 by Bess Harvest, RN Outcome: Progressing   Problem: Health Behavior/Discharge Planning: Goal: Ability to manage health-related needs will improve 08/16/2021 1634 by Bess Harvest, RN Outcome: Progressing 08/16/2021 1634 by Bess Harvest, RN Outcome: Progressing   Problem: Clinical Measurements: Goal: Ability to maintain clinical measurements within normal limits will improve Outcome: Progressing Goal: Will remain free from infection Outcome: Progressing Goal: Diagnostic test results will improve Outcome: Progressing Goal: Respiratory complications will improve Outcome: Progressing Goal: Cardiovascular complication will be avoided Outcome: Progressing   Problem: Activity: Goal: Risk for activity intolerance will decrease Outcome: Progressing   Problem: Nutrition: Goal: Adequate nutrition will be maintained Outcome: Progressing   Problem: Coping: Goal: Level of anxiety will decrease Outcome: Progressing   Problem: Elimination: Goal: Will not experience complications related to bowel motility Outcome: Progressing Goal: Will not experience complications related to urinary retention Outcome: Progressing   Problem: Pain Managment: Goal: General experience of comfort will improve Outcome: Progressing   Problem: Safety: Goal: Ability to remain free from injury will improve Outcome: Progressing   Problem: Skin Integrity: Goal: Risk for impaired skin integrity will decrease Outcome: Progressing

## 2021-08-16 NOTE — Progress Notes (Signed)
[name], female   DOB: [birth year], 75 y.o.   MRN: [id number] BP (!) 153/64 (BP Location: Left Arm)   Pulse 86   Temp 98.5 F (36.9 C)   Resp 18   Ht 5\' 5"  (1.651 m)   Wt 59 kg   SpO2 96%   BMI 21.63 kg/m  Alert, oriented to person Continues to need significant motivation to be active. Does not appear to be in pain, but gives little effort Family is wanting and willing to take care of her at home. Do not want a snf.

## 2021-08-16 NOTE — Plan of Care (Signed)
  Problem: Activity: Goal: Risk for activity intolerance will decrease Outcome: Progressing   Problem: Nutrition: Goal: Adequate nutrition will be maintained Outcome: Progressing   Problem: Coping: Goal: Level of anxiety will decrease Outcome: Progressing   Problem: Elimination: Goal: Will not experience complications related to urinary retention Outcome: Progressing   Problem: Pain Managment: Goal: General experience of comfort will improve Outcome: Progressing   Problem: Safety: Goal: Ability to remain free from injury will improve Outcome: Progressing   Problem: Skin Integrity: Goal: Risk for impaired skin integrity will decrease Outcome: Progressing   

## 2021-08-16 NOTE — Progress Notes (Signed)
Occupational Therapy Treatment Patient Details Name: Mckenzie [name] MRN: [id number] DOB: 1946/07/26 Today's Date: 08/16/2021   History of present illness Pt is a 75 y/o female who presents with intractable pain due to nonhealing T12 fracture. She underwent ORIF T12 fracture with posterior lateral fusion T10-L2 on 08/10/2021. She has sustained, since March of this year< 4 compression fractures in the thoracic spine; T1, T4, T11, T12. 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   OT comments  Pt making slow, limited progress towards acute OT goals. Impaired cognition from baseline remains though affect noted to be more relaxed and pleasant compared to last session with this OT. Session limited by bowel incontinence at start of session (pericare completed with max-total A +2 for safety/equipment at bed level). After rest break pt able to come to EOB position (total assistance) and sit for about 6 minutes with up to max A for trunk support. Pt able to complete a few breathing exercises in this position. Unable to attempt standing today d/t fatigue. No family present this session but understand that their plan is d/c home. Equipment recommendations updated below.    Recommendations for follow up therapy are one component of a multi-disciplinary discharge planning process, led by the attending physician.  Recommendations may be updated based on patient status, additional functional criteria and insurance authorization.    Follow Up Recommendations  Skilled nursing-short term rehab (<3 hours/day)    Assistance Recommended at Discharge Frequent or [facility] bed;Wheelchair cushion (measurements OT);Wheelchair (measurements OT);Other (comment) (if d/c home. also will need hoyer lift and maximize HH services)    Recommendations for Other Services      Precautions / Restrictions  Precautions Precautions: Fall;Back Precaution Booklet Issued: No Precaution Comments: step by step cueing for precautions during bed mobility Required Braces or Orthoses: Spinal Brace Spinal Brace: Lumbar corset;Applied in sitting position Restrictions Weight Bearing Restrictions: No       Mobility Bed Mobility Overal bed mobility: Needs Assistance Bed Mobility: Rolling;Sidelying to Sit;Sit to Sidelying Rolling: Max assist;+2 for safety/equipment Sidelying to sit: Max assist;Total assist;+2 for safety/equipment     Sit to sidelying: Total assist;+2 for safety/equipment General bed mobility comments: Pt completed roll to each side. rest break then completed sidelying <> EOB. assist for all aspects. With multimodal cueing, pt able to flex R knee to position foot into mattress to faciliate roll to left side.    Transfers                   General transfer comment: Deferred further mobility due to lethargy. zero sitting balance in static sitting.     Balance Overall balance assessment: Needs assistance Sitting-balance support: Feet unsupported;Bilateral upper extremity supported Sitting balance-Leahy Scale: Zero Sitting balance - Comments: Max A to steady trunk in upright, midline position. Pt cued for BUE and BLE support position. Needed tactile cueing to position R hand onto mattress for support.                                   ADL either performed or assessed with clinical judgement   ADL Overall ADL's : Needs assistance/impaired Eating/Feeding: Set up;Bed level Eating/Feeding Details (indicate cue type and reason): able to hold Ensure and drink at bed level.  Toileting- Clothing Manipulation and Hygiene: Total assistance;Bed level;+2 for safety/equipment Toileting - Clothing Manipulation Details (indicate cue type and reason): Pt with episode of bowel incontinence at start of session. Completed roll to each side for  pericare and linen change. Step by step cueing needed.       General ADL Comments: Pt found to be incontinent of bowel at start of session. Pericare completed at bed level with pt rolling to each side with max A and pericare tasked completed with total A. Rest break then pt able to sit EOB about 6 minutes. Max - total A for sidelying<>EOB, +2 safety and to scoot pt up in the bed at end of session.    Extremity/Trunk Assessment Upper Extremity Assessment Upper Extremity Assessment: Generalized weakness   Lower Extremity Assessment Lower Extremity Assessment: Defer to PT evaluation        Vision   Vision Assessment?: Vision impaired- to be further tested in functional context Additional Comments: left inattention   Perception     Praxis      Cognition Arousal/Alertness: Awake/alert Behavior During Therapy: Flat affect (less flat then prior session with this OT) Overall Cognitive Status: Impaired/Different from baseline Area of Impairment: Attention;Memory;Following commands;Safety/judgement;Awareness;Problem solving;Orientation                 Orientation Level: Disoriented to;Place;Time;Situation Current Attention Level: Focused Memory: Decreased short-term memory;Decreased recall of precautions Following Commands: Follows one step commands inconsistently;Follows one step commands with increased time Safety/Judgement: Decreased awareness of safety;Decreased awareness of deficits Awareness: Intellectual Problem Solving: Slow processing;Difficulty sequencing;Requires verbal cues;Decreased initiation General Comments: Pt noted to have much calmer/relaxed affect than when seen by this OT 5 days ago. Smiling and engaging in some back and forth conversation. Continues to have L side inattention          Exercises     Shoulder Instructions       General Comments No family present this session.    Pertinent Vitals/ Pain       Pain Assessment: Faces Faces Pain Scale:  Hurts even more Pain Location: back during bed mobility Pain Descriptors / Indicators: Operative site guarding;Sore Pain Intervention(s): Monitored during session;Premedicated before session;Limited activity within patient's tolerance;Repositioned  Home Living                                          Prior Functioning/Environment              Frequency  Min 2X/week        Progress Toward Goals  OT Goals(current goals can now be found in the care plan section)  Progress towards OT goals: Progressing toward goals (slow, limited progress)  Acute Rehab OT Goals Patient Stated Goal: pt: feel better. family: work on transfers including caregiver training OT Goal Formulation: With patient/family Time For Goal Achievement: 08/25/21 Potential to Achieve Goals: Good ADL Goals Pt Will Perform Grooming: with set-up;sitting;with supervision Pt Will Perform Upper Body Dressing: with min assist;sitting Pt Will Perform Lower Body Dressing: sit to/from stand;with mod assist Pt Will Transfer to Toilet: with mod assist;stand pivot transfer;bedside commode Pt Will Perform Toileting - Clothing Manipulation and hygiene: with mod assist;sitting/lateral leans;sit to/from stand Additional ADL Goal #1: Pt will complete bed mobility at min A level to prepare for EOB/OOB ADLs.  Plan Discharge plan remains appropriate    Co-evaluation  AM-PAC OT "6 Clicks" Daily Activity     Outcome Measure   Help from another person eating meals?: A Little Help from another person taking care of personal grooming?: A Little Help from another person toileting, which includes using toliet, bedpan, or urinal?: Total Help from another person bathing (including washing, rinsing, drying)?: A Lot Help from another person to put on and taking off regular upper body clothing?: A Lot Help from another person to put on and taking off regular lower body clothing?: A Lot 6 Click  Score: 13    End of Session    OT Visit Diagnosis: Unsteadiness on feet (R26.81);Other abnormalities of gait and mobility (R26.89);Muscle weakness (generalized) (M62.81);History of falling (Z91.81);Other symptoms and signs involving cognitive function;Pain;Other symptoms and signs involving the nervous system (R29.898)   Activity Tolerance Patient limited by fatigue;Patient limited by pain   Patient Left in bed;with call bell/phone within reach;with bed alarm set;with SCD's reapplied   Nurse Communication Other (comment) (RN present at start of session)        Time: 8138-8719 OT Time Calculation (min): 37 min  Charges: OT General Charges $OT Visit: 1 Visit OT Treatments $Self Care/Home Management : 23-37 mins  Tyrone Schimke, OT Acute Rehabilitation Services Pager: [phone] Office: [place], [place] 08/16/2021, 11:44 AM

## 2021-08-16 NOTE — TOC Progression Note (Signed)
Transition of Care [facility]) - Progression Note    Patient Details  Name: Mckenzie [name] MRN: [id number] Date of Birth: 02-02-1946  [facility]) CM/SW Contact  Joanne Chars, LCSW Phone Number: 08/16/2021, 3:33 PM  Clinical Narrative:   CSW LM with Dr Lacy Duverney medical assistant at [facility] requesting that he order palliative care consult.      Expected Discharge Plan: [place] Barriers to Discharge: Continued Medical Work up  Expected Discharge Plan and Services Expected Discharge Plan: [place]   Discharge Planning Services: CM Consult Post Acute Care Choice: Durable Medical Equipment Living arrangements for the past 2 months: Single Family Home                                       Social Determinants of Health (SDOH) Interventions    Readmission Risk Interventions No flowsheet data found.

## 2021-08-17 NOTE — Plan of Care (Signed)
  Problem: Activity: Goal: Risk for activity intolerance will decrease Outcome: Progressing   Problem: Nutrition: Goal: Adequate nutrition will be maintained Outcome: Progressing   Problem: Coping: Goal: Level of anxiety will decrease Outcome: Progressing   Problem: Safety: Goal: Ability to remain free from injury will improve Outcome: Progressing   

## 2021-08-17 NOTE — TOC Progression Note (Signed)
Transition of Care [facility]) - Progression Note    Patient Details  Name: Mckenzie [name] MRN: [id number] Date of Birth: [birth year]  Transition of Care [facility]) CM/SW [place], RN Phone Number:[phone]  08/17/2021, 1:00 PM  Clinical Narrative:    CM received message to set up [facility] DME due to family wants to take patient home asap. CM spoke with husband who is at the bedside. Husband gave CM consent to speak with daughter [name] [phone]. Daughter states that she feels that she is getting the run around. Daughter states she wants patient to come home with foley catheter. CM made daughter aware that Md would have to make that decision. Daughter requesting that CM assist with hospital bed, hoyer life and wheelchair. Daughter wants to be clear that the family absolutely does not want and type of SNF for rehab or palliative care. Daughter states that her and her sister live next door and will assist with the care of the patient. Daughter is requesting that family bring patient home with no further discussions. Family wants to be in control of the care of the patient. DME has been ordered through [place]. CM spoke with [facility]. Office can not guarantee delivery for today for Hospital bed but will work on it.    Expected Discharge Plan: [place] Barriers to Discharge: Continued Medical Work up  Expected Discharge Plan and Services Expected Discharge Plan: [place]   Discharge Planning Services: CM Consult Post Acute Care Choice: Durable Medical Equipment Living arrangements for the past 2 months: Single Family Home                                       Social Determinants of Health (SDOH) Interventions    Readmission Risk Interventions No flowsheet data found.

## 2021-08-17 NOTE — Plan of Care (Signed)

## 2021-08-17 NOTE — Progress Notes (Signed)
Physical Therapy Treatment Patient Details Name: Mckenzie [name] MRN: [id number] DOB: 11-18-1945 Today's Date: 08/17/2021   History of Present Illness Pt is a 75 y/o female who presents with intractable pain due to nonhealing T12 fracture. She underwent ORIF T12 fracture with posterior lateral fusion T10-L2 on 08/10/2021. She has sustained, since March of this year< 4 compression fractures in the thoracic spine; T1, T4, T11, T12. 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    PT Comments    Pt continues to require +2 total assist for all aspects of mobility including sitting balance. Based on performance today, pt is at high risk for falling off the [facility]. Discussed with pt's husband that she is likely going to need to use the bed pan if she is not improved by discharge, and home health therapies will work with them to progress to using the [facility]. Continue to recommend SNF level rehab however understand that family prefers home so they can care for her. Recommend wheelchair, hoyer lift and hospital bed at this time for home management. As pt requiring max to total assist for sitting balance, opted for chair position in the bed for safety instead of transitioning to the chair at end of session. Will continue to follow.     Recommendations for follow up therapy are one component of a multi-disciplinary discharge planning process, led by the attending physician.  Recommendations may be updated based on patient status, additional functional criteria and insurance authorization.  Follow Up Recommendations  Skilled nursing-short term rehab (<3 hours/day)     Assistance Recommended at Discharge Frequent or constant Supervision/Assistance  Equipment Recommendations  Wheelchair (measurements PT);Other (comment) [facility] bed, hoyer lift if returning home)    Recommendations for Other Services Rehab consult     Precautions / Restrictions  Precautions Precautions: Fall;Back Precaution Booklet Issued: No Precaution Comments: step by step cueing for precautions during bed mobility Required Braces or Orthoses: Spinal Brace Spinal Brace: Lumbar corset;Applied in sitting position Restrictions Weight Bearing Restrictions: No     Mobility  Bed Mobility Overal bed mobility: Needs Assistance Bed Mobility: Rolling;Sidelying to Sit;Sit to Sidelying Rolling: Total assist;+2 for safety/equipment Sidelying to sit: Total assist;+2 for safety/equipment     Sit to sidelying: Total assist;+2 for safety/equipment General bed mobility comments: Rolling to each side for pericare after bowel incontinence in the bed. Heavy assist for transition to/from EOB. +2 for all aspects.    Transfers Overall transfer level: Needs assistance Equipment used: 1 person hand held assist Transfers: Sit to/from Stand Sit to Stand: Total assist;+2 safety/equipment;From elevated surface           General transfer comment: Attempted sit>stand at EOB x3. B knees blocked for support. Pt not initiating any movement and did not appear to be putting forth any effort to stand.    Ambulation/Gait               General Gait Details: Unable to progress to gait training at this time.   Stairs             Wheelchair Mobility    Modified Rankin (Stroke Patients Only) Modified Rankin (Stroke Patients Only) Pre-Morbid Rankin Score: Moderately severe disability Modified Rankin: Severe disability     Balance Overall balance assessment: Needs assistance Sitting-balance support: Feet unsupported;Bilateral upper extremity supported Sitting balance-Leahy Scale: Zero Sitting balance - Comments: Max A to steady trunk in upright, midline position. Pt cued for BUE and BLE  support position. Needed tactile cueing to position R hand onto mattress for support. Postural control: Right lateral lean;Posterior lean                                   Cognition Arousal/Alertness: Awake/alert Behavior During Therapy: Flat affect Overall Cognitive Status: Impaired/Different from baseline Area of Impairment: Attention;Memory;Following commands;Safety/judgement;Awareness;Problem solving;Orientation                 Orientation Level: Disoriented to;Place;Time;Situation (Able to recall month/day of birth date but not the year) Current Attention Level: Focused Memory: Decreased short-term memory;Decreased recall of precautions Following Commands: Follows one step commands inconsistently;Follows one step commands with increased time (<25% of the time) Safety/Judgement: Decreased awareness of safety;Decreased awareness of deficits Awareness: Intellectual Problem Solving: Slow processing;Difficulty sequencing;Requires verbal cues;Decreased initiation;Requires tactile cues General Comments: L side inattention. Unable to answer orientation questions or answers inappropriately with increased time to answer.        Exercises Other Exercises Other Exercises: Pt's husband was instructed in PROM exercise and educated that as delirium clears, PROM activity will progress to AROM. Heel slides, hip abd/add, ankle ROM all directions, gastroc stretch, Shoulder/elbow flexion/extension.    General Comments        Pertinent Vitals/Pain Pain Assessment: Faces Faces Pain Scale: Hurts even more Breathing: normal Negative Vocalization: occasional moan/groan, low speech, negative/disapproving quality Facial Expression: facial grimacing Body Language: tense, distressed pacing, fidgeting Consolability: distracted or reassured by voice/touch PAINAD Score: 5 Pain Location: back during bed mobility Pain Descriptors / Indicators: Operative site guarding;Sore Pain Intervention(s): Limited activity within patient's tolerance;Monitored during session;Repositioned    Home Living                          Prior Function            PT  Goals (current goals can now be found in the care plan section) Acute Rehab PT Goals Patient Stated Goal: Family's goal is to get pt back home PT Goal Formulation: Patient unable to participate in goal setting Time For Goal Achievement: 08/25/21 Potential to Achieve Goals: Fair Progress towards PT goals: Not progressing toward goals - comment    Frequency    Min 3X/week      PT Plan Discharge plan needs to be updated;Frequency needs to be updated    Co-evaluation              AM-PAC PT "6 Clicks" Mobility   Outcome Measure  Help needed turning from your back to your side while in a flat bed without using bedrails?: Total Help needed moving from lying on your back to sitting on the side of a flat bed without using bedrails?: Total Help needed moving to and from a bed to a chair (including a wheelchair)?: Total Help needed standing up from a chair using your arms (e.g., wheelchair or bedside chair)?: Total Help needed to walk in hospital room?: Total Help needed climbing 3-5 steps with a railing? : Total 6 Click Score: 6    End of Session Equipment Utilized During Treatment: Gait belt;Back brace Activity Tolerance: Patient limited by lethargy Patient left: in bed;with call bell/phone within reach;with bed alarm set;with family/visitor present Nurse Communication: Mobility status PT Visit Diagnosis: Unsteadiness on feet (R26.81);Pain;Muscle weakness (generalized) (M62.81) Pain - part of body:  (back)     Time: 2353-6144 PT Time Calculation (min) (ACUTE ONLY): 37  min  Charges:  $Therapeutic Activity: 8-22 mins $Self Care/Home Management: 8-22                     Rolinda Roan, PT, DPT Acute Rehabilitation Services Pager: [phone] Office: [phone]    Thelma Comp 08/17/2021, 1:01 PM

## 2021-08-17 NOTE — TOC Progression Note (Signed)
Transition of Care [facility]) - Progression Note    Patient Details  Name: Mckenzie [name] MRN: [id number] Date of Birth: Jan 10, 1946  Transition of Care [facility]) CM/SW [place], RN Phone Number:[phone]  08/17/2021, 12:58 PM  Clinical Narrative:    Patient suffers from compression fractures which impairs their ability to perform daily activities like bathing, dressing, grooming, and toileting in the home.  A cane, crutch, or walker will not resolve  issue with performing activities of daily living. A wheelchair will allow patient to safely perform daily activities. Patient is not able to propel themselves in the home using a standard weight wheelchair due to general weakness. Patient can self propel in the lightweight wheelchair. Length of need 12 months . Accessories: elevating leg rests (ELRs), wheel locks, extensions and anti-tippers.   Expected Discharge Plan: [place] Barriers to Discharge: Continued Medical Work up  Expected Discharge Plan and Services Expected Discharge Plan: [place]   Discharge Planning Services: CM Consult Post Acute Care Choice: Durable Medical Equipment Living arrangements for the past 2 months: Single Family Home                                       Social Determinants of Health (SDOH) Interventions    Readmission Risk Interventions No flowsheet data found.

## 2021-08-17 NOTE — Progress Notes (Signed)
[name], female   DOB: 11-Nov-1945, 75 y.o.   MRN: [id number] BP (!) 147/64 (BP Location: Left Arm)   Pulse 88   Temp 98.7 F (37.1 C) (Axillary)   Resp (!) 22   Ht 5\' 5"  (1.651 m)   Wt 59 kg   SpO2 96%   BMI 21.63 kg/m  Alert and oriented x2 Moving all extremities Continues to be confused. Will follow some commands Non fluent , speech is clear Will try hard to get her home this weekend.

## 2021-08-18 NOTE — TOC Progression Note (Addendum)
Transition of Care [facility]) - Progression Note    Patient Details  Name: Mckenzie [name] MRN: [id number] Date of Birth: [birth year]  Transition of Care [facility]) CM/SW Contact  Carles Collet, RN Phone Number: 08/18/2021, 4:32 PM  Clinical Narrative:    Spoke w [employer] who will be delivering all DME tonight. Spoke w daughter and spouse to discuss Dc plan.  Reviewed [place] agency ratings and daughter agreeable to any [employer] or better agency. Discussed that I will call around for [facility] times and make referral. Referral made to [facility] who will let me know in the morning  PATIENT [place] RN PT OT AIDE    Expected Discharge Plan: [place] Barriers to Discharge: Continued Medical Work up  Expected Discharge Plan and Services Expected Discharge Plan: [place]   Discharge Planning Services: CM Consult Post Acute Care Choice: Durable Medical Equipment Living arrangements for the past 2 months: Single Family Home                                       Social Determinants of Health (SDOH) Interventions    Readmission Risk Interventions No flowsheet data found.

## 2021-08-18 NOTE — Progress Notes (Signed)
Subjective: NAEs o/n. Pt states pain is controlled  Objective: Vital signs in last 24 hours: Temp:  [98.2 F (36.8 C)-99.1 F (37.3 C)] 98.2 F (36.8 C) (11/12 0814) Pulse Rate:  [87-98] 98 (11/12 0814) Resp:  [14-22] 14 (11/12 0814) BP: (129-182)/(60-77) 182/77 (11/12 0814) SpO2:  [95 %-98 %] 98 % (11/12 0814)  Intake/Output from previous day: No intake/output data recorded. Intake/Output this shift: Total I/O In: 3 [I.V.:3] Out: -  Awake, alert, oriented to person only Incisions c/d.  Sutured midline incision Deconditioned, 4-/5 strength throughout  Lab Results: No results for input(s): WBC, HGB, HCT, PLT in the last 72 hours. BMET No results for input(s): NA, K, CL, CO2, GLUCOSE, BUN, CREATININE, CALCIUM in the last 72 hours.  Studies/Results: No results found.  Assessment/Plan:   LOS: 8 days  S/p thoracic fusion - awaiting home equipment and hospital bed, then likely discharge home - she will need stitch removal next week.  It would be best for patient to come to clinic for suture removal and wound check but family wishes to avoid any clinic visits.  Suture removal kit and instructions were dispensed.  Mckenzie [name] 08/18/2021, 10:38 AM

## 2021-08-18 NOTE — Plan of Care (Signed)

## 2021-08-19 MED ORDER — ACETAMINOPHEN-CODEINE #3 300-30 MG PO TABS: 1.0000 | ORAL_TABLET | Freq: Four times a day (QID) | ORAL | 0 refills | Status: AC | PRN

## 2021-08-19 NOTE — Plan of Care (Signed)

## 2021-08-19 NOTE — TOC Transition Note (Addendum)
Transition of Care [facility]) - CM/SW Discharge Note   Patient Details  Name: Mckenzie [name] MRN: [id number] Date of Birth: 18-Apr-1946  Transition of Care [facility]) CM/SW Contact:  Carles Collet, RN Phone Number: 08/19/2021, 8:39 AM   Clinical Narrative:    [place] confirmed with start of care date 11/14 with [facility]. Anticipate DC today.    PATIENT WILL NEED HOME HEALTH ORDER WITH FACE TO FACE FOR [facility] RN PT OT AIDE. Please type in comment section date patient will need sutures removed by home health nurse.  Family also requesting Foley Catheter be placed prior to DC.   11:50 Spoke w daughter [name] and she confirms that all DME has been set up at home. Informed her of [facility] tomorrow. She states that family will assist with transport. Reviewed last PT note stating patient requires assist of 2 and offered PTAR transport, [name] declined but is aware that she can call me back if they change their minds and want PTAR to be set up.  Dr Christella Noa states that sutures/ staples will be removed prior to DC and [facility] RN will not need to do it. TOC signing off.   Barriers to Discharge: Continued Medical Work up   Patient Goals and CMS Choice Patient states their goals for this hospitalization and ongoing recovery are:: to return home      Discharge Placement                       Discharge Plan and Services   Discharge Planning Services: CM Consult Post Acute Care Choice: Durable Medical Equipment                    HH Arranged: RN, PT, OT, Nurse's Aide [place] Agency: [place] Date [place]: 08/18/21 Time [facility]: [place] Representative spoke with at [place]: Angie  Social Determinants of Health ([place]) Interventions     Readmission Risk Interventions No flowsheet data found.

## 2021-08-19 NOTE — Progress Notes (Signed)
14 Fr. Straight tip, latex foley inserted per MD orders. Foley along with foley bag, leg bag and foley wipes will be sent with patient at discharge, per orders. Foley care instructions and education given to patients family member (husband and daughter).   Pt to follow up with urology and home health care RN for ongoing use and instructions.

## 2021-08-19 NOTE — Discharge Instructions (Addendum)

## 2021-08-19 NOTE — Discharge Summary (Signed)
Physician Discharge Summary  [name] MRN: [id number] DOB/AGE: 1946/02/14 75 y.o.  Admit date: 08/10/2021 Discharge date: 08/19/2021  Admission Diagnoses:T12 compression fracture  Discharge Diagnoses: T12 compression fracture Active Problems:   Compression fracture of T12 vertebra [facility])   Discharged Condition: fair  Hospital Course: Mckenzie [name] presented with a non healing T12 compression fracture. She was taken to the operating room for fixation via percutaneous pedicle screws. Post op she was delirious and all narcotics were held. She remains confused, but does respond. Family states she will be much better at home.wound is clean, dry, no signs of infection. She is voiding, able to stand but has not been walking well, and she is eating  Treatments: surgery: Thoracic ten-Lumbar 2 Open reduction internal fixation t12 fracture Posterior lateral fusion t12-L2 posterolateral arthrodesis with allograft morsels    Discharge Exam: Blood pressure (!) 169/71, pulse 89, temperature 98.3 F (36.8 C), temperature source Oral, resp. rate 18, height 5\' 5"  (1.651 m), weight 59 kg, SpO2 97 %. 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  Disposition: Discharge disposition: 01-Home or Self Care      Thoracic twelve Compression fracture, thoracic eleven compression fracture  Allergies as of 08/19/2021       Reactions   Aldomet [methyldopa] Other (See Comments)   Flu-like symptoms   Fosamax [alendronate Sodium] Other (See Comments)   Weakness/fatigue    Plavix [clopidogrel] Other (See Comments)   Arm swelled, bleeding   Acetazolamide Rash   Buspar [buspirone] Palpitations        Medication List     TAKE these medications    acetaminophen 325 MG tablet Commonly known as: TYLENOL Take 2 tablets (650 mg total) by mouth  every 6 (six) hours as needed for mild pain (or Fever >/= 101).   acetaminophen-codeine 300-30 MG tablet Commonly known as: TYLENOL #3 Take 1 tablet by mouth every 6 (six) hours as needed for up to 7 days for moderate pain.   amLODipine 5 MG tablet Commonly known as: NORVASC Take 1 tablet (5 mg total) by mouth daily.   aspirin 81 MG EC tablet Take 1 tablet (81 mg total) by mouth daily. X one month, then stop   atorvastatin 40 MG tablet Commonly known as: LIPITOR Take 1 tablet (40 mg total) by mouth daily.   Bromfenac Sodium 0.07 % Soln Place 1 drop into the right eye every evening.   CALCIUM+D3 PO Take 2 tablets by mouth in the morning.   Combigan 0.2-0.5 % ophthalmic solution Generic drug: brimonidine-timolol Place 1 drop into both eyes every 12 (twelve) hours.   dorzolamide 2 % ophthalmic solution Commonly known as: TRUSOPT Place 1 drop into the left eye 2 (two) times daily.   escitalopram 20 MG tablet Commonly known as: LEXAPRO Take 1 tablet (20 mg total) by mouth daily.   ferrous sulfate 325 (65 FE) MG tablet Take 325 mg by mouth 3 (three) times a week.   fluorometholone 0.1 % ophthalmic suspension Commonly known as: FML Place 1 drop into the right eye 2 (two) times daily.   furosemide 20 MG tablet Commonly known as: LASIX Take 10 mg by mouth in the morning.   HYDROcodone-acetaminophen 5-325 MG tablet Commonly known as: NORCO/VICODIN Take 1 tablet by mouth every 6 (six) hours as needed for severe pain.   HYDROcodone-acetaminophen 5-325 MG tablet Commonly known as: NORCO/VICODIN Take 1 tablet  by mouth every 6 (six) hours as needed for severe pain. Start taking on: September 01, 2021   HYDROcodone-acetaminophen 5-325 MG tablet Commonly known as: NORCO/VICODIN Take 1 tablet by mouth every 6 (six) hours as needed for severe pain. Start taking on: October 01, 2021   latanoprost 0.005 % ophthalmic solution Commonly known as: XALATAN Place 1 drop into both  eyes at bedtime.   lisinopril 10 MG tablet Commonly known as: ZESTRIL Take 1 tablet (10 mg total) by mouth daily.   magnesium oxide 400 MG tablet Commonly known as: MAG-OX Take 400 mg by mouth in the morning.   multivitamin with minerals Tabs tablet Take 1 tablet by mouth in the morning. One A Day Multivitamin   predniSONE 5 MG tablet Commonly known as: DELTASONE Take 3 tablets (15 mg total) by mouth daily with breakfast for 14 days, THEN 2 tablets (10 mg total) daily with breakfast for 14 days, THEN 1.5 tablets (7.5 mg total) daily with breakfast for 14 days, THEN 1 tablet (5 mg total) daily with breakfast for 14 days. Start taking on: July 24, 2021   solifenacin 10 MG tablet Commonly known as: VESICARE Take 1 tablet (10 mg total) by mouth daily.   Vitamin D2 50 MCG (2000 UT) Tabs Take 1 tablet by mouth daily.               Durable Medical Equipment  (From admission, onward)           Start     Ordered   08/17/21 1255  For home use only DME lightweight manual wheelchair with seat cushion  Once       Comments: Patient suffers from compression fractures which impairs their ability to perform daily activities like bathing, dressing, grooming, and toileting in the home.  A cane, crutch, or walker will not resolve  issue with performing activities of daily living. A wheelchair will allow patient to safely perform daily activities. Patient is not able to propel themselves in the home using a standard weight wheelchair due to general weakness. Patient can self propel in the lightweight wheelchair. Length of need 12 months . Accessories: elevating leg rests (ELRs), wheel locks, extensions and anti-tippers.   08/17/21 1256   08/17/21 1254  For home use only DME Hospital bed  Once       Question Answer Comment  Length of Need 12 Months   Patient has (list medical condition): weakness compression fractures   The above medical condition requires: Patient requires the ability  to reposition immediately   Head must be elevated greater than: 30 degrees   Bed type Semi-electric   Hoyer Lift Yes   Support Surface: Low Air loss Mattress      08/17/21 1256            Follow-up Information     Ashok Pall, MD Follow up in 3 week(s).   Specialty: Neurosurgery Why: please call to make an appointment Contact information: [address] [place] [address] [phone]                 Signed: Ashok Pall 08/19/2021, 11:18 AM

## 2021-08-20 ENCOUNTER — Telehealth: Payer: Self-pay

## 2021-08-20 NOTE — Telephone Encounter (Signed)
Transition Care Management Unsuccessful Follow-up Telephone Call  Date of discharge and from where:  08/19/2021 / [facility]   Attempts:  1st Attempt  Reason for unsuccessful TCM follow-up call:  HIPAA compliant  Left voice message  Quinn Plowman RN,BSN,CCM RN Case Manager [place] [phone]

## 2021-08-21 ENCOUNTER — Encounter: Payer: Self-pay | Admitting: Urology

## 2021-08-21 ENCOUNTER — Ambulatory Visit: Payer: Medicare Other | Admitting: Urology

## 2021-08-21 NOTE — Telephone Encounter (Signed)
Please advise 

## 2021-08-22 ENCOUNTER — Telehealth: Payer: Self-pay

## 2021-08-22 NOTE — Telephone Encounter (Signed)
Transition Care Management Follow-up Telephone Call Date of discharge and from where: 08/19/2021  Mckenzie [name]  Patient gave me verbal permission to speak with daughters about care. I spoke with Mckenzie [name].  How have you been since you were released from the hospital? "Doing well"   Any questions or concerns? Yes Daughter reports home health PT came out. Have not heard from nurse. Bath aid has not been out either.  Daughter reports that they have a hoyer lift and do not know how to use it. No one showed them. Patient is struggling to use the bed pan.  Items Reviewed: Did the pt receive and understand the discharge instructions provided? Yes  Medications obtained and verified? Yes  Other? No  Any new allergies since your discharge? No  Dietary orders reviewed? Yes Do you have support at home? Yes   Home Care and Equipment/Supplies: Were home health services ordered? yes If so, what is the name of the agency? [facility]   see above note.   Has the agency set up a time to come to the patient's home? PT came out but no other services. I encouraged daughter to call agency today.  Were any new equipment or medical supplies ordered?  Yes What is the name of the medical supply agency? [facility] Were you able to get the supplies/equipment? yes Do you have any questions related to the use of the equipment or supplies? Yes: Does not know how to use lift.   Functional Questionnaire: (I = Independent and D = Dependent) ADLs: D  Meal Prep- D  Eating- I  Managing Meds- D  Follow up appointments reviewed:  PCP Hospital f/u appt confirmed? Yes  Scheduled to see PCP on 09/10/2021  Specialist Hospital f/u appt confirmed? Yes  Are transportation arrangements needed? Video visit, no If their condition worsens, is the pt aware to call PCP or go to the Emergency Dept.? Yes Was the patient provided with contact information for the PCP's office or ED? Yes Was to pt encouraged to call back with questions  or concerns? Yes  Mckenzie Rand, RN, BSN, CEN [facility] [employer] [phone]

## 2021-08-24 ENCOUNTER — Telehealth: Payer: Self-pay

## 2021-08-24 ENCOUNTER — Telehealth: Payer: Self-pay | Admitting: Family Medicine

## 2021-08-24 NOTE — Telephone Encounter (Signed)
Daughter aware and verbalizes understanding per dpr.

## 2021-08-24 NOTE — Telephone Encounter (Signed)
Pt daughter called and wanted to know the status of the Home health order that was place for  a nurse  cath changes and supplies , she said that pt had an appt with pcp on yesterday and was arrange with [facility] . She want you to know  so if you need to send an order to them.

## 2021-08-24 NOTE — Telephone Encounter (Signed)
Spoke with patients daughter. She does not think it is necessary unless it can help fix some of the issues they are having with home health and catheters. She says they have reached out to urology about catheter supplies and have not heard back.  They are also struggling to get qualified home health to help  with hoyer lift training. She has contacted home health leadership and they have assured that qualified people will come out and when they get there they are still reluctant to help.

## 2021-08-24 NOTE — Telephone Encounter (Signed)
Chronic care management is not going to be able to help with those issues. I would stay on top of home health until they send qualified help. It looks like Dr. Felipa Eth gave his nurse an order to give home health orders for the catheter on 08/22/2021.

## 2021-08-24 NOTE — Telephone Encounter (Signed)
I received a message from [facility] after doing a TOC call that patient would be a good candidate for chronic care management. Please call patient/husband and ask if they are interested. If yes, I will place the referral.

## 2021-08-27 NOTE — Telephone Encounter (Signed)
See telephone encounter.

## 2021-08-27 NOTE — Telephone Encounter (Signed)
I called [phone]- Home health Number- [place] with Vicente Males, nurse.  Orders given per Dr. Felipa Eth Please give order to [facility] for routine foley catheter care with catheter changes every 4 weeks or prn.

## 2021-08-28 ENCOUNTER — Telehealth: Payer: Self-pay | Admitting: Family Medicine

## 2021-08-28 ENCOUNTER — Encounter: Payer: Self-pay | Admitting: Family Medicine

## 2021-08-28 ENCOUNTER — Telehealth: Payer: Self-pay

## 2021-08-28 ENCOUNTER — Ambulatory Visit (INDEPENDENT_AMBULATORY_CARE_PROVIDER_SITE_OTHER): Payer: Medicare Other | Admitting: Family Medicine

## 2021-08-28 DIAGNOSIS — R3989 Other symptoms and signs involving the genitourinary system: Secondary | ICD-10-CM | POA: Diagnosis not present

## 2021-08-28 NOTE — Telephone Encounter (Signed)
Needs appointment

## 2021-08-28 NOTE — Telephone Encounter (Signed)
Mckenzie [name] will do a telephone visit today. Delilah Shan and husband aware

## 2021-08-28 NOTE — Telephone Encounter (Signed)
Spoke with Vicente Males and [place]. Aware that Karsten Fells will be taking over [facility] plan of care for patient. They are aware to contact us and patient needs an appointment before getting any specimens.

## 2021-08-28 NOTE — Telephone Encounter (Signed)
I just received results. Her urinalysis does not look like she has an infection. I did give the order to send the urine off to be cultured as well. Should this grow anything I will treat.

## 2021-08-28 NOTE — Telephone Encounter (Signed)
Did you receive results?

## 2021-08-28 NOTE — Progress Notes (Addendum)
Virtual Visit via Telephone Note  I connected with Mckenzie [name] on 08/28/21 at 12:44 PM by telephone and verified that I am speaking with the correct person using two identifiers. Mckenzie [name] is currently located at home and her husband is currently with her during this visit. The provider, Loman [place], FNP is located in their office at time of visit.  I discussed the limitations, risks, security and privacy concerns of performing an evaluation and management service by telephone and the availability of in person appointments. I also discussed with the patient that there may be a patient responsible charge related to this service. The patient expressed understanding and agreed to proceed.  Subjective: PCP: Loman [place], FNP  Chief Complaint  Patient presents with   Urinary Tract Infection   Patient and her husband report the home health nurse collected a urine sample yesterday due to urine being dark, cloudy, malodorous, with pus. Sample was collected after clamping off the foley catheter, so it is a sterile specimen. Patient denies any fever. She does have chronic back pain which she states does feel different today.    ROS: Per HPI  Current Outpatient Medications:    acetaminophen (TYLENOL) 325 MG tablet, Take 2 tablets (650 mg total) by mouth every 6 (six) hours as needed for mild pain (or Fever >/= 101)., Disp: , Rfl:    amLODipine (NORVASC) 5 MG tablet, Take 1 tablet (5 mg total) by mouth daily., Disp: 90 tablet, Rfl: 1   aspirin EC 81 MG EC tablet, Take 1 tablet (81 mg total) by mouth daily. X one month, then stop, Disp: 30 tablet, Rfl: 0   atorvastatin (LIPITOR) 40 MG tablet, Take 1 tablet (40 mg total) by mouth daily., Disp: 30 tablet, Rfl: 1   Bromfenac Sodium 0.07 % SOLN, Place 1 drop into the right eye every evening., Disp: , Rfl:    Calcium Carb-Cholecalciferol (CALCIUM+D3 PO), Take 2 tablets by mouth in the morning., Disp: , Rfl:    COMBIGAN 0.2-0.5 %  ophthalmic solution, Place 1 drop into both eyes every 12 (twelve) hours., Disp: , Rfl:    dorzolamide (TRUSOPT) 2 % ophthalmic solution, Place 1 drop into the left eye 2 (two) times daily., Disp: , Rfl:    Ergocalciferol (VITAMIN D2) 50 MCG (2000 UT) TABS, Take 1 tablet by mouth daily., Disp: 30 tablet, Rfl: 6   escitalopram (LEXAPRO) 20 MG tablet, Take 1 tablet (20 mg total) by mouth daily., Disp: 90 tablet, Rfl: 1   ferrous sulfate 325 (65 FE) MG tablet, Take 325 mg by mouth 3 (three) times a week., Disp: , Rfl:    fluorometholone (FML) 0.1 % ophthalmic suspension, Place 1 drop into the right eye 2 (two) times daily., Disp: , Rfl:    furosemide (LASIX) 20 MG tablet, Take 10 mg by mouth in the morning., Disp: , Rfl:    HYDROcodone-acetaminophen (NORCO/VICODIN) 5-325 MG tablet, Take 1 tablet by mouth every 6 (six) hours as needed for severe pain., Disp: 120 tablet, Rfl: 0   [START ON 09/01/2021] HYDROcodone-acetaminophen (NORCO/VICODIN) 5-325 MG tablet, Take 1 tablet by mouth every 6 (six) hours as needed for severe pain. (Patient not taking: No sig reported), Disp: 120 tablet, Rfl: 0   [START ON 10/01/2021] HYDROcodone-acetaminophen (NORCO/VICODIN) 5-325 MG tablet, Take 1 tablet by mouth every 6 (six) hours as needed for severe pain. (Patient not taking: No sig reported), Disp: 120 tablet, Rfl: 0   latanoprost (XALATAN) 0.005 % ophthalmic solution,  Place 1 drop into both eyes at bedtime., Disp: , Rfl:    lisinopril (ZESTRIL) 10 MG tablet, Take 1 tablet (10 mg total) by mouth daily., Disp: 30 tablet, Rfl: 2   magnesium oxide (MAG-OX) 400 MG tablet, Take 400 mg by mouth in the morning., Disp: , Rfl:    Multiple Vitamin (MULTIVITAMIN WITH MINERALS) TABS tablet, Take 1 tablet by mouth in the morning. One A Day Multivitamin, Disp: , Rfl:    predniSONE (DELTASONE) 5 MG tablet, Take 3 tablets (15 mg total) by mouth daily with breakfast for 14 days, THEN 2 tablets (10 mg total) daily with breakfast for 14  days, THEN 1.5 tablets (7.5 mg total) daily with breakfast for 14 days, THEN 1 tablet (5 mg total) daily with breakfast for 14 days., Disp: 105 tablet, Rfl: 0   solifenacin (VESICARE) 10 MG tablet, Take 1 tablet (10 mg total) by mouth daily., Disp: 30 tablet, Rfl: 11  Allergies  Allergen Reactions   Aldomet [Methyldopa] Other (See Comments)    Flu-like symptoms   Fosamax [Alendronate Sodium] Other (See Comments)    Weakness/fatigue    Plavix [Clopidogrel] Other (See Comments)    Arm swelled, bleeding   Acetazolamide Rash   Buspar [Buspirone] Palpitations   Past Medical History:  Diagnosis Date   Anemia    low iron   Arthritis    Depression    High cholesterol    Hypertension    Retinal micro-aneurysm of right eye    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 Suspected UTI Order given to home health for UA and culture. They will be faxing results to our office. Will treat based on results.  Urine dipstick shows positive for leukocytes (trace).  Micro exam: 0-5 RBC's per HPF and few bacteria.   Follow Up Instructions:  I discussed the assessment and treatment plan with the patient. The patient was provided an opportunity to ask questions and all were answered. The patient agreed with the plan and demonstrated an understanding of the instructions.   The patient was advised to call back or seek an in-person evaluation if the symptoms worsen or if the condition fails to improve as anticipated.  The above assessment and management plan was discussed with the patient. The patient verbalized understanding of and has agreed to the management plan. Patient is aware to call the clinic if symptoms persist or worsen. Patient is aware when to return to the clinic for a follow-up visit. Patient educated on when it is appropriate to  go to the emergency department.   Time call ended: 12:55 PM  I provided 11 minutes of non-face-to-face time during this encounter.  Hendricks Limes, MSN, APRN, FNP-C [facility] 08/28/21

## 2021-08-29 NOTE — Telephone Encounter (Signed)
She wants to know what should be concerned with when they dump catheter bag white clumps are in it and it smells so bad and she is very confused.

## 2021-08-29 NOTE — Telephone Encounter (Signed)
Lmtcb.

## 2021-08-29 NOTE — Telephone Encounter (Signed)
It could be normal bodily discharge or sediment from the urinary tract. The urine sits in the bag, it often does smell. If she is not drinking enough it makes the urine more concentrated which will have a stronger smell.

## 2021-08-29 NOTE — Telephone Encounter (Signed)
Daughter aware and verbalizes understanding. 

## 2021-09-03 ENCOUNTER — Telehealth: Payer: Self-pay

## 2021-09-03 ENCOUNTER — Encounter: Payer: Self-pay | Admitting: Family Medicine

## 2021-09-03 DIAGNOSIS — N3000 Acute cystitis without hematuria: Secondary | ICD-10-CM

## 2021-09-03 MED ORDER — CEFDINIR 300 MG PO CAPS: 300.0000 mg | ORAL_CAPSULE | Freq: Two times a day (BID) | ORAL | 0 refills | Status: AC

## 2021-09-03 NOTE — Telephone Encounter (Signed)
Home health nurse, Vicente Males, called today. Patient catheter is chronically leaking.  Patient currently has 83F catheter and catheter is becoming clogged.   Home health nurse asked for 45F catheter size order. Order given for 31f catheter. And to have patient drink plenty of fluids to keep urine clear.   Patient currently has catheter for decrease mobility issues. Message sent to MD

## 2021-09-07 ENCOUNTER — Ambulatory Visit: Payer: Medicare Other | Admitting: Family Medicine

## 2021-09-10 ENCOUNTER — Encounter: Payer: Self-pay | Admitting: Family Medicine

## 2021-09-11 ENCOUNTER — Other Ambulatory Visit: Payer: Medicare Other

## 2021-09-11 ENCOUNTER — Other Ambulatory Visit: Payer: Self-pay | Admitting: *Deleted

## 2021-09-11 DIAGNOSIS — R410 Disorientation, unspecified: Secondary | ICD-10-CM

## 2021-09-11 NOTE — Progress Notes (Signed)
Ordered for [facility] to draw per Dr. Warrick Parisian & MMM

## 2021-09-11 NOTE — Telephone Encounter (Signed)
Ok to add labs

## 2021-09-12 ENCOUNTER — Encounter (INDEPENDENT_AMBULATORY_CARE_PROVIDER_SITE_OTHER): Payer: Medicare Other | Admitting: Ophthalmology

## 2021-09-12 LAB — BMP8+EGFR
BUN/Creatinine Ratio: 20 (ref 12–28)
BUN: 10 mg/dL (ref 8–27)
CO2: 23 mmol/L (ref 20–29)
Calcium: 9.1 mg/dL (ref 8.7–10.3)
Chloride: 103 mmol/L (ref 96–106)
Creatinine, Ser: 0.51 mg/dL — ABNORMAL LOW (ref 0.57–1.00)
Glucose: 163 mg/dL — ABNORMAL HIGH (ref 70–99)
Potassium: 3.9 mmol/L (ref 3.5–5.2)
Sodium: 143 mmol/L (ref 134–144)
eGFR: 97 mL/min/{1.73_m2} (ref >59)

## 2021-09-12 LAB — CBC WITH DIFFERENTIAL/PLATELET
Basophils Absolute: 0 10*3/uL (ref 0.0–0.2)
Basos: 0 %
EOS (ABSOLUTE): 0.3 10*3/uL (ref 0.0–0.4)
Eos: 2 %
Hematocrit: 32.9 % — ABNORMAL LOW (ref 34.0–46.6)
Hemoglobin: 10.5 g/dL — ABNORMAL LOW (ref 11.1–15.9)
Immature Grans (Abs): 0 10*3/uL (ref 0.0–0.1)
Immature Granulocytes: 0 %
Lymphocytes Absolute: 1.1 10*3/uL (ref 0.7–3.1)
Lymphs: 10 %
MCH: 29.2 pg (ref 26.6–33.0)
MCHC: 31.9 g/dL (ref 31.5–35.7)
MCV: 91 fL (ref 79–97)
Monocytes Absolute: 0.7 10*3/uL (ref 0.1–0.9)
Monocytes: 7 %
Neutrophils Absolute: 8.7 10*3/uL — ABNORMAL HIGH (ref 1.4–7.0)
Neutrophils: 81 %
Platelets: 272 10*3/uL (ref 150–450)
RBC: 3.6 x10E6/uL — ABNORMAL LOW (ref 3.77–5.28)
RDW: 12.8 % (ref 11.7–15.4)
WBC: 10.9 10*3/uL — ABNORMAL HIGH (ref 3.4–10.8)

## 2021-09-12 LAB — C-REACTIVE PROTEIN: CRP: 24 mg/L — ABNORMAL HIGH (ref 0–10)

## 2021-09-12 LAB — SEDIMENTATION RATE: Sed Rate: 25 mm/hr (ref 0–40)

## 2021-09-13 ENCOUNTER — Encounter: Payer: Self-pay | Admitting: Internal Medicine

## 2021-09-13 ENCOUNTER — Ambulatory Visit: Payer: Medicare Other | Admitting: Family Medicine

## 2021-09-14 ENCOUNTER — Telehealth (INDEPENDENT_AMBULATORY_CARE_PROVIDER_SITE_OTHER): Payer: Medicare Other | Admitting: Family Medicine

## 2021-09-14 DIAGNOSIS — R531 Weakness: Secondary | ICD-10-CM

## 2021-09-14 DIAGNOSIS — S22080D Wedge compression fracture of T11-T12 vertebra, subsequent encounter for fracture with routine healing: Secondary | ICD-10-CM | POA: Diagnosis not present

## 2021-09-14 DIAGNOSIS — H4050X0 Glaucoma secondary to other eye disorders, unspecified eye, stage unspecified: Secondary | ICD-10-CM

## 2021-09-14 DIAGNOSIS — N3941 Urge incontinence: Secondary | ICD-10-CM | POA: Diagnosis not present

## 2021-09-14 DIAGNOSIS — Z8673 Personal history of transient ischemic attack (TIA), and cerebral infarction without residual deficits: Secondary | ICD-10-CM

## 2021-09-14 DIAGNOSIS — R410 Disorientation, unspecified: Secondary | ICD-10-CM | POA: Diagnosis not present

## 2021-09-14 DIAGNOSIS — M8080XG Other osteoporosis with current pathological fracture, unspecified site, subsequent encounter for fracture with delayed healing: Secondary | ICD-10-CM

## 2021-09-14 DIAGNOSIS — M316 Other giant cell arteritis: Secondary | ICD-10-CM | POA: Diagnosis not present

## 2021-09-14 DIAGNOSIS — E782 Mixed hyperlipidemia: Secondary | ICD-10-CM

## 2021-09-14 NOTE — Progress Notes (Signed)
Virtual Visit via Video note  I connected with Mckenzie [name] on 09/14/21 at 5:04 PM by video and verified that I am speaking with the correct person using two identifiers. Mckenzie [name] is currently located at home and her husband and daughter are currently with her during visit. The provider, Loman Brooklyn, FNP is located in their office at time of visit.  I discussed the limitations, risks, security and privacy concerns of performing an evaluation and management service by video and the availability of in person appointments. I also discussed with the patient that there may be a patient responsible charge related to this service. The patient expressed understanding and agreed to proceed.  Subjective: PCP: Loman Brooklyn, FNP  Chief Complaint  Patient presents with   Medical Management of Chronic Issues   Patient's daughter reports she has been more confused, which is why a CBC and BMP was recently ordered.  She and her dad feel like this started when Ms. Monter started taking Lipitor, so they stopped giving it to her yesterday.  They do feel like they have seen some improvement in her mentation today.  Sed rate and CRP were also drawn for rheumatology per the daughter's request.  Patient had a T10 to L2 ORIF for a T12 compression fracture and a posterior lateral fusion of T12-L2 posterolateral arthrodesis with allograft morsels on 08/10/2021. She is still doing therapy and has good days and bad days. She is only taking Tylenol and Aleve for back pain prior to doing physical therapy.   Urge Incontinence: patient is no longer taking Vesicare since she has a foley catheter.   Giant cell arteritis: managed by rheumatology. Currently taking Prednisone daily which is being weaned due to complications from long term steroids.   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  Previously failed treatment with Fosamax. Per rheumatology's last note, they did not want to start new medication at that time. She is scheduled for follow-up on 09/25/2021.  History of CVA: patient/family not agreeable in taking Plavix. They feel the stroke was caused by Actemra and the risk of the medication elevating blood pressure and cholesterol levels. The medication was stopped after her CVA.   Hyperlipidemia: last lipid panel with elevated total cholesterol and LDL on 06/26/2021. She was on atorvastatin 40 mg daily, but the family just stopped this.  The 10-year ASCVD risk score (Arnett DK, et al., 2019) is: 23.1%   Values used to calculate the score:     Age: 75 years     Sex: Female     Is Non-Hispanic African American: No     Diabetic: No     Tobacco smoker: No     Systolic Blood Pressure: 315 mmHg     Is BP treated: Yes     HDL Cholesterol: 49 mg/dL     Total Cholesterol: 267 mg/dL   ROS: Per HPI  Current Outpatient Medications:    acetaminophen (TYLENOL) 325 MG tablet, Take 2 tablets (650 mg total) by mouth every 6 (six) hours as needed for mild pain (or Fever >/= 101)., Disp: , Rfl:    amLODipine (NORVASC) 5 MG tablet, Take 1 tablet (5 mg total) by mouth daily., Disp: 90 tablet, Rfl: 1   aspirin EC 81 MG EC tablet, Take 1 tablet (81 mg total) by mouth daily. X one month, then stop, Disp: 30 tablet, Rfl: 0  atorvastatin (LIPITOR) 40 MG tablet, Take 1 tablet (40 mg total) by mouth daily., Disp: 30 tablet, Rfl: 1   Bromfenac Sodium 0.07 % SOLN, Place 1 drop into the right eye every evening., Disp: , Rfl:    Calcium Carb-Cholecalciferol (CALCIUM+D3 PO), Take 2 tablets by mouth in the morning., Disp: , Rfl:    COMBIGAN 0.2-0.5 % ophthalmic solution, Place 1 drop into both eyes every 12 (twelve) hours., Disp: , Rfl:    dorzolamide (TRUSOPT) 2 % ophthalmic solution, Place 1 drop into the left eye 2 (two) times daily., Disp: , Rfl:    Ergocalciferol (VITAMIN D2) 50 MCG (2000 UT)  TABS, Take 1 tablet by mouth daily., Disp: 30 tablet, Rfl: 6   escitalopram (LEXAPRO) 20 MG tablet, Take 1 tablet (20 mg total) by mouth daily., Disp: 90 tablet, Rfl: 1   ferrous sulfate 325 (65 FE) MG tablet, Take 325 mg by mouth 3 (three) times a week., Disp: , Rfl:    fluorometholone (FML) 0.1 % ophthalmic suspension, Place 1 drop into the right eye 2 (two) times daily., Disp: , Rfl:    furosemide (LASIX) 20 MG tablet, Take 10 mg by mouth in the morning., Disp: , Rfl:    HYDROcodone-acetaminophen (NORCO/VICODIN) 5-325 MG tablet, Take 1 tablet by mouth every 6 (six) hours as needed for severe pain., Disp: 120 tablet, Rfl: 0   HYDROcodone-acetaminophen (NORCO/VICODIN) 5-325 MG tablet, Take 1 tablet by mouth every 6 (six) hours as needed for severe pain. (Patient not taking: No sig reported), Disp: 120 tablet, Rfl: 0   [START ON 10/01/2021] HYDROcodone-acetaminophen (NORCO/VICODIN) 5-325 MG tablet, Take 1 tablet by mouth every 6 (six) hours as needed for severe pain. (Patient not taking: No sig reported), Disp: 120 tablet, Rfl: 0   latanoprost (XALATAN) 0.005 % ophthalmic solution, Place 1 drop into both eyes at bedtime., Disp: , Rfl:    lisinopril (ZESTRIL) 10 MG tablet, Take 1 tablet (10 mg total) by mouth daily., Disp: 30 tablet, Rfl: 2   magnesium oxide (MAG-OX) 400 MG tablet, Take 400 mg by mouth in the morning., Disp: , Rfl:    Multiple Vitamin (MULTIVITAMIN WITH MINERALS) TABS tablet, Take 1 tablet by mouth in the morning. One A Day Multivitamin, Disp: , Rfl:    predniSONE (DELTASONE) 5 MG tablet, Take 3 tablets (15 mg total) by mouth daily with breakfast for 14 days, THEN 2 tablets (10 mg total) daily with breakfast for 14 days, THEN 1.5 tablets (7.5 mg total) daily with breakfast for 14 days, THEN 1 tablet (5 mg total) daily with breakfast for 14 days., Disp: 105 tablet, Rfl: 0   solifenacin (VESICARE) 10 MG tablet, Take 1 tablet (10 mg total) by mouth daily., Disp: 30 tablet, Rfl:  11  Allergies  Allergen Reactions   Aldomet [Methyldopa] Other (See Comments)    Flu-like symptoms   Fosamax [Alendronate Sodium] Other (See Comments)    Weakness/fatigue    Plavix [Clopidogrel] Other (See Comments)    Arm swelled, bleeding   Acetazolamide Rash   Buspar [Buspirone] Palpitations   Past Medical History:  Diagnosis Date   Anemia    low iron   Arthritis    Depression    High cholesterol    Hypertension    Retinal micro-aneurysm of right eye    Stroke ([place])    2 or 3 mini strokes    Observations/Objective: A&O  No respiratory distress or wheezing audible over the phone Mood, judgement, and thought processes all WNL  Assessment and Plan: 1. Confusion Improving since family stopped atorvastatin.  2. Compression fracture of T12 vertebra, sequela Recent surgery. Continue physical therapy.  3. Urge incontinence No longer on medication due to foley catheter.  4. 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[place]) Managed by rheumatology.  5. Secondary glaucoma, unspecified glaucoma stage, unspecified laterality Managed by ophthalmology.  6. Other osteoporosis with current pathological fracture with delayed healing, subsequent encounter Not currently on medication, but needs to be. I will wait and see what rheumatology says next week.  7. History of CVA (cerebrovascular accident) Patient not willing to take Plavix and has now stopped atorvastatin.  8. Mixed hyperlipidemia Uncontrolled. Patient has stopped atorvastatin.  9. Generalized weakness Continue physical therapy.    Follow Up Instructions: Return in about 3 months (around 12/13/2021) for follow-up of chronic medication conditions.   I discussed the assessment and treatment plan with the patient. The patient was provided an opportunity to ask questions and all were answered. The patient agreed with the plan and demonstrated an understanding of the instructions.   The patient was advised to call back or seek an  in-person evaluation if the symptoms worsen or if the condition fails to improve as anticipated.  The above assessment and management plan was discussed with the patient. The patient verbalized understanding of and has agreed to the management plan. Patient is aware to call the clinic if symptoms persist or worsen. Patient is aware when to return to the clinic for a follow-up visit. Patient educated on when it is appropriate to go to the emergency department.   Time call ended: 5:15 PM  I provided 11 minutes of face-to-face time during this encounter.   Hendricks Limes, MSN, APRN, FNP-C [facility] 09/14/21

## 2021-09-19 ENCOUNTER — Encounter: Payer: Self-pay | Admitting: Family Medicine

## 2021-09-25 ENCOUNTER — Ambulatory Visit: Payer: Medicare Other | Admitting: Internal Medicine

## 2021-09-26 ENCOUNTER — Ambulatory Visit: Payer: Medicare Other | Admitting: Internal Medicine

## 2021-10-08 ENCOUNTER — Encounter: Payer: Self-pay | Admitting: Family Medicine

## 2021-10-10 ENCOUNTER — Ambulatory Visit (INDEPENDENT_AMBULATORY_CARE_PROVIDER_SITE_OTHER): Payer: Medicare Other | Admitting: Family Medicine

## 2021-10-10 DIAGNOSIS — R410 Disorientation, unspecified: Secondary | ICD-10-CM | POA: Diagnosis not present

## 2021-10-10 DIAGNOSIS — R443 Hallucinations, unspecified: Secondary | ICD-10-CM

## 2021-10-10 DIAGNOSIS — R4689 Other symptoms and signs involving appearance and behavior: Secondary | ICD-10-CM

## 2021-10-10 NOTE — Progress Notes (Signed)
Virtual Visit via Telephone Note  I connected with Mckenzie [name] on 10/10/21 at 3:05 PM by telephone and verified that I am speaking with the correct person using two identifiers. Mckenzie [name] is currently located at home and her husband is currently with her during this visit. The provider, Loman Brooklyn, FNP is located in their office at time of visit.  I discussed the limitations, risks, security and privacy concerns of performing an evaluation and management service by telephone and the availability of in person appointments. I also discussed with the patient that there may be a patient responsible charge related to this service. The patient expressed understanding and agreed to proceed.  Subjective: PCP: Loman Brooklyn, FNP  Chief Complaint  Patient presents with   Referral   Patient's daughter and husband are concerned about patient's cognition. She experiences confusion, hallucinations and becomes argumentative. Her therapist has suggested she see Dr. Linus Mako, neurologist, at [facility].    ROS: Per HPI  Current Outpatient Medications:    acetaminophen (TYLENOL) 325 MG tablet, Take 2 tablets (650 mg total) by mouth every 6 (six) hours as needed for mild pain (or Fever >/= 101)., Disp: , Rfl:    amLODipine (NORVASC) 5 MG tablet, Take 1 tablet (5 mg total) by mouth daily., Disp: 90 tablet, Rfl: 1   aspirin EC 81 MG EC tablet, Take 1 tablet (81 mg total) by mouth daily. X one month, then stop, Disp: 30 tablet, Rfl: 0   atorvastatin (LIPITOR) 40 MG tablet, Take 1 tablet (40 mg total) by mouth daily., Disp: 30 tablet, Rfl: 1   Bromfenac Sodium 0.07 % SOLN, Place 1 drop into the right eye every evening., Disp: , Rfl:    Calcium Carb-Cholecalciferol (CALCIUM+D3 PO), Take 2 tablets by mouth in the morning., Disp: , Rfl:    COMBIGAN 0.2-0.5 % ophthalmic solution, Place 1 drop into both eyes every 12 (twelve) hours., Disp: , Rfl:    dorzolamide (TRUSOPT) 2 % ophthalmic  solution, Place 1 drop into the left eye 2 (two) times daily., Disp: , Rfl:    Ergocalciferol (VITAMIN D2) 50 MCG (2000 UT) TABS, Take 1 tablet by mouth daily., Disp: 30 tablet, Rfl: 6   escitalopram (LEXAPRO) 20 MG tablet, Take 1 tablet (20 mg total) by mouth daily., Disp: 90 tablet, Rfl: 1   ferrous sulfate 325 (65 FE) MG tablet, Take 325 mg by mouth 3 (three) times a week., Disp: , Rfl:    fluorometholone (FML) 0.1 % ophthalmic suspension, Place 1 drop into the right eye 2 (two) times daily., Disp: , Rfl:    furosemide (LASIX) 20 MG tablet, Take 10 mg by mouth in the morning., Disp: , Rfl:    latanoprost (XALATAN) 0.005 % ophthalmic solution, Place 1 drop into both eyes at bedtime., Disp: , Rfl:    lisinopril (ZESTRIL) 10 MG tablet, Take 1 tablet (10 mg total) by mouth daily., Disp: 30 tablet, Rfl: 2   magnesium oxide (MAG-OX) 400 MG tablet, Take 400 mg by mouth in the morning., Disp: , Rfl:    Multiple Vitamin (MULTIVITAMIN WITH MINERALS) TABS tablet, Take 1 tablet by mouth in the morning. One A Day Multivitamin, Disp: , Rfl:   Allergies  Allergen Reactions   Aldomet [Methyldopa] Other (See Comments)    Flu-like symptoms   Fosamax [Alendronate Sodium] Other (See Comments)    Weakness/fatigue    Plavix [Clopidogrel] Other (See Comments)    Arm swelled, bleeding   Acetazolamide Rash  Buspar [Buspirone] Palpitations   Past Medical History:  Diagnosis Date   Anemia    low iron   Arthritis    Depression    High cholesterol    Hypertension    Retinal micro-aneurysm of right eye    Stroke ([place])    2 or 3 mini strokes    Observations/Objective: A&O  No respiratory distress or wheezing audible over the phone Mood, judgement, and thought processes all WNL  Assessment and Plan: 1-4. Confusion/Hallucinations/Argumentative behavior/Behavior concern - Ambulatory referral to Neurology   Follow Up Instructions:  I discussed the assessment and treatment plan with the patient. The  patient was provided an opportunity to ask questions and all were answered. The patient agreed with the plan and demonstrated an understanding of the instructions.   The patient was advised to call back or seek an in-person evaluation if the symptoms worsen or if the condition fails to improve as anticipated.  The above assessment and management plan was discussed with the patient. The patient verbalized understanding of and has agreed to the management plan. Patient is aware to call the clinic if symptoms persist or worsen. Patient is aware when to return to the clinic for a follow-up visit. Patient educated on when it is appropriate to go to the emergency department.   Time call ended: 3:16 PM  I provided 11 minutes of non-face-to-face time during this encounter.  Hendricks Limes, MSN, APRN, FNP-C [facility] 10/10/21

## 2021-10-11 ENCOUNTER — Encounter: Payer: Self-pay | Admitting: Family Medicine

## 2021-10-12 ENCOUNTER — Telehealth: Payer: Medicare Other | Admitting: Family Medicine

## 2021-10-17 ENCOUNTER — Encounter: Payer: Self-pay | Admitting: Family Medicine

## 2021-10-22 ENCOUNTER — Telehealth: Payer: Self-pay | Admitting: Family Medicine

## 2021-10-22 NOTE — Telephone Encounter (Signed)
**  [place] After Hours/ Emergency Line Call**  Patient: Mckenzie [name] .  PCP: Loman Brooklyn, FNP  After Hours nursing line states that patient's daughter is concerned that her mother may have a urinary tract infection.  Apparently patient is bedbound and has an indwelling catheter.  She did an at home urine dip from her Foley bag and it showed positive.  She is been having altered mental status for several days now.  She apparently contacted her PCP on a couple of different occasions in the last several months with similar and "Britney always treats her over the phone".  I reviewed the chart and I do see that she has been contacting regarding altered mental status but do not see evidence of multiple treatments over the phone.  In fact last urinalysis was done in outside facility on 08/27/2021 and did not show evidence of UTI at that time.  Last urine microscopy in February 2022 and again did not show any significant bacterial growth.  Given patient's high risk of stroke, she has had a CVA previously, and is not on statin due family discontinuing, I have recommended that she seek immediate medical attention in the ER to rule out other processes.  Will forward to PCP.  Aliz Meritt M. Lajuana Ripple, DO

## 2021-10-24 ENCOUNTER — Telehealth: Payer: Self-pay | Admitting: Family Medicine

## 2021-10-24 NOTE — Telephone Encounter (Signed)
°  Left message for patient to call back and schedule Medicare Annual Wellness Visit (AWV) to be completed by video or phone.  No hx of AWV eligible for AWVI as of  05/08/2011 per [employer]  Please schedule at anytime with [place] --- Karle Starch  45 Minutes appointment   Any questions, please call me at [phone]

## 2021-10-25 ENCOUNTER — Ambulatory Visit (INDEPENDENT_AMBULATORY_CARE_PROVIDER_SITE_OTHER): Payer: Medicare Other

## 2021-10-25 ENCOUNTER — Other Ambulatory Visit: Payer: Self-pay

## 2021-10-25 DIAGNOSIS — Z8673 Personal history of transient ischemic attack (TIA), and cerebral infarction without residual deficits: Secondary | ICD-10-CM

## 2021-10-25 DIAGNOSIS — Z981 Arthrodesis status: Secondary | ICD-10-CM

## 2021-10-25 DIAGNOSIS — Z7952 Long term (current) use of systemic steroids: Secondary | ICD-10-CM

## 2021-10-25 DIAGNOSIS — F4323 Adjustment disorder with mixed anxiety and depressed mood: Secondary | ICD-10-CM

## 2021-10-25 DIAGNOSIS — F32A Depression, unspecified: Secondary | ICD-10-CM

## 2021-10-25 DIAGNOSIS — E876 Hypokalemia: Secondary | ICD-10-CM

## 2021-10-25 DIAGNOSIS — R3981 Functional urinary incontinence: Secondary | ICD-10-CM

## 2021-10-25 DIAGNOSIS — D638 Anemia in other chronic diseases classified elsewhere: Secondary | ICD-10-CM

## 2021-10-25 DIAGNOSIS — E440 Moderate protein-calorie malnutrition: Secondary | ICD-10-CM

## 2021-10-25 DIAGNOSIS — I10 Essential (primary) hypertension: Secondary | ICD-10-CM

## 2021-10-25 DIAGNOSIS — G9341 Metabolic encephalopathy: Secondary | ICD-10-CM

## 2021-10-25 DIAGNOSIS — M4854XD Collapsed vertebra, not elsewhere classified, thoracic region, subsequent encounter for fracture with routine healing: Secondary | ICD-10-CM

## 2021-10-25 DIAGNOSIS — Z466 Encounter for fitting and adjustment of urinary device: Secondary | ICD-10-CM

## 2021-10-25 DIAGNOSIS — Z79899 Other long term (current) drug therapy: Secondary | ICD-10-CM

## 2021-10-25 DIAGNOSIS — M316 Other giant cell arteritis: Secondary | ICD-10-CM

## 2021-10-28 ENCOUNTER — Emergency Department (HOSPITAL_COMMUNITY): Payer: Medicare Other

## 2021-10-28 ENCOUNTER — Inpatient Hospital Stay (HOSPITAL_COMMUNITY)
Admission: EM | Admit: 2021-10-28 | Discharge: 2021-10-31 | DRG: 178 | Disposition: A | Discharge status: Home Health | Payer: Medicare Other | Attending: Internal Medicine | Admitting: Internal Medicine

## 2021-10-28 ENCOUNTER — Other Ambulatory Visit: Payer: Self-pay

## 2021-10-28 ENCOUNTER — Encounter (HOSPITAL_COMMUNITY): Payer: Self-pay

## 2021-10-28 DIAGNOSIS — Z8249 Family history of ischemic heart disease and other diseases of the circulatory system: Secondary | ICD-10-CM

## 2021-10-28 DIAGNOSIS — Z978 Presence of other specified devices: Secondary | ICD-10-CM | POA: Diagnosis present

## 2021-10-28 DIAGNOSIS — Z83438 Family history of other disorder of lipoprotein metabolism and other lipidemia: Secondary | ICD-10-CM

## 2021-10-28 DIAGNOSIS — Z981 Arthrodesis status: Secondary | ICD-10-CM

## 2021-10-28 DIAGNOSIS — Z79899 Other long term (current) drug therapy: Secondary | ICD-10-CM

## 2021-10-28 DIAGNOSIS — Z7952 Long term (current) use of systemic steroids: Secondary | ICD-10-CM

## 2021-10-28 DIAGNOSIS — R0602 Shortness of breath: Secondary | ICD-10-CM

## 2021-10-28 DIAGNOSIS — E785 Hyperlipidemia, unspecified: Secondary | ICD-10-CM | POA: Diagnosis present

## 2021-10-28 DIAGNOSIS — Z833 Family history of diabetes mellitus: Secondary | ICD-10-CM

## 2021-10-28 DIAGNOSIS — U071 COVID-19: Secondary | ICD-10-CM | POA: Diagnosis not present

## 2021-10-28 DIAGNOSIS — R351 Nocturia: Secondary | ICD-10-CM | POA: Diagnosis present

## 2021-10-28 DIAGNOSIS — N393 Stress incontinence (female) (male): Secondary | ICD-10-CM | POA: Diagnosis present

## 2021-10-28 DIAGNOSIS — Z8673 Personal history of transient ischemic attack (TIA), and cerebral infarction without residual deficits: Secondary | ICD-10-CM

## 2021-10-28 DIAGNOSIS — D84821 Immunodeficiency due to drugs: Secondary | ICD-10-CM | POA: Diagnosis present

## 2021-10-28 DIAGNOSIS — Z825 Family history of asthma and other chronic lower respiratory diseases: Secondary | ICD-10-CM

## 2021-10-28 DIAGNOSIS — E876 Hypokalemia: Secondary | ICD-10-CM | POA: Diagnosis not present

## 2021-10-28 DIAGNOSIS — Z888 Allergy status to other drugs, medicaments and biological substances status: Secondary | ICD-10-CM

## 2021-10-28 DIAGNOSIS — F4323 Adjustment disorder with mixed anxiety and depressed mood: Secondary | ICD-10-CM | POA: Diagnosis present

## 2021-10-28 DIAGNOSIS — M316 Other giant cell arteritis: Secondary | ICD-10-CM | POA: Diagnosis present

## 2021-10-28 DIAGNOSIS — S22080A Wedge compression fracture of T11-T12 vertebra, initial encounter for closed fracture: Secondary | ICD-10-CM | POA: Diagnosis present

## 2021-10-28 DIAGNOSIS — R531 Weakness: Secondary | ICD-10-CM

## 2021-10-28 DIAGNOSIS — I10 Essential (primary) hypertension: Secondary | ICD-10-CM | POA: Diagnosis present

## 2021-10-28 DIAGNOSIS — R0902 Hypoxemia: Secondary | ICD-10-CM | POA: Diagnosis present

## 2021-10-28 DIAGNOSIS — Z7982 Long term (current) use of aspirin: Secondary | ICD-10-CM

## 2021-10-28 DIAGNOSIS — J9811 Atelectasis: Secondary | ICD-10-CM | POA: Diagnosis present

## 2021-10-28 DIAGNOSIS — E1169 Type 2 diabetes mellitus with other specified complication: Secondary | ICD-10-CM | POA: Diagnosis present

## 2021-10-28 DIAGNOSIS — Z87891 Personal history of nicotine dependence: Secondary | ICD-10-CM

## 2021-10-28 DIAGNOSIS — E1159 Type 2 diabetes mellitus with other circulatory complications: Secondary | ICD-10-CM | POA: Diagnosis present

## 2021-10-28 LAB — CBC WITH DIFFERENTIAL/PLATELET
Abs Immature Granulocytes: 0.02 10*3/uL (ref 0.00–0.07)
Basophils Absolute: 0 10*3/uL (ref 0.0–0.1)
Basophils Relative: 0 %
Eosinophils Absolute: 0 10*3/uL (ref 0.0–0.5)
Eosinophils Relative: 0 %
HCT: 36.3 % (ref 36.0–46.0)
Hemoglobin: 11.2 g/dL — ABNORMAL LOW (ref 12.0–15.0)
Immature Granulocytes: 0 %
Lymphocytes Relative: 9 %
Lymphs Abs: 0.8 10*3/uL (ref 0.7–4.0)
MCH: 27.5 pg (ref 26.0–34.0)
MCHC: 30.9 g/dL (ref 30.0–36.0)
MCV: 89 fL (ref 80.0–100.0)
Monocytes Absolute: 0.6 10*3/uL (ref 0.1–1.0)
Monocytes Relative: 7 %
Neutro Abs: 6.8 10*3/uL (ref 1.7–7.7)
Neutrophils Relative %: 84 %
Platelets: 438 10*3/uL — ABNORMAL HIGH (ref 150–400)
RBC: 4.08 MIL/uL (ref 3.87–5.11)
RDW: 13.9 % (ref 11.5–15.5)
WBC: 8.2 10*3/uL (ref 4.0–10.5)
nRBC: 0 % (ref 0.0–0.2)

## 2021-10-28 LAB — BASIC METABOLIC PANEL
Anion gap: 10 (ref 5–15)
BUN: 9 mg/dL (ref 8–23)
CO2: 27 mmol/L (ref 22–32)
Calcium: 9.5 mg/dL (ref 8.9–10.3)
Chloride: 97 mmol/L — ABNORMAL LOW (ref 98–111)
Creatinine, Ser: 0.59 mg/dL (ref 0.44–1.00)
GFR, Estimated: >60 mL/min (ref >60)
Glucose, Bld: 124 mg/dL — ABNORMAL HIGH (ref 70–99)
Potassium: 4 mmol/L (ref 3.5–5.1)
Sodium: 134 mmol/L — ABNORMAL LOW (ref 135–145)

## 2021-10-28 LAB — URINALYSIS, ROUTINE W REFLEX MICROSCOPIC
Bilirubin Urine: NEGATIVE
Glucose, UA: NEGATIVE mg/dL
Ketones, ur: NEGATIVE mg/dL
Nitrite: NEGATIVE
Protein, ur: 100 mg/dL — AB
RBC / HPF: >50 RBC/hpf — ABNORMAL HIGH (ref 0–5)
Specific Gravity, Urine: 1.016 (ref 1.005–1.030)
WBC, UA: >50 WBC/hpf — ABNORMAL HIGH (ref 0–5)
pH: 7 (ref 5.0–8.0)

## 2021-10-28 LAB — TROPONIN I (HIGH SENSITIVITY)
Troponin I (High Sensitivity): 7 ng/L (ref <18)
Troponin I (High Sensitivity): 6 ng/L (ref <18)

## 2021-10-28 LAB — RESP PANEL BY RT-PCR (FLU A&B, COVID) ARPGX2
Influenza A by PCR: NEGATIVE
Influenza B by PCR: NEGATIVE
SARS Coronavirus 2 by RT PCR: POSITIVE — AB

## 2021-10-28 MED ORDER — GUAIFENESIN-DM 100-10 MG/5ML PO SYRP: 10.0000 mL | ORAL_SOLUTION | ORAL | Status: DC | PRN

## 2021-10-28 MED ORDER — LATANOPROST 0.005 % OP SOLN
1.0000 [drp] | Freq: Every day | OPHTHALMIC | Status: DC
  Filled 2021-10-28: qty 2.5

## 2021-10-28 MED ORDER — ONDANSETRON HCL 4 MG/2ML IJ SOLN: 4.0000 mg | Freq: Four times a day (QID) | INTRAMUSCULAR | Status: DC | PRN

## 2021-10-28 MED ORDER — ONDANSETRON HCL 4 MG PO TABS: 4.0000 mg | ORAL_TABLET | Freq: Four times a day (QID) | ORAL | Status: DC | PRN

## 2021-10-28 MED ORDER — DORZOLAMIDE HCL 2 % OP SOLN
1.0000 [drp] | Freq: Two times a day (BID) | OPHTHALMIC | Status: DC
  Administered 2021-10-29: 1 [drp] via OPHTHALMIC
  Filled 2021-10-28: qty 10

## 2021-10-28 MED ORDER — ENOXAPARIN SODIUM 40 MG/0.4ML IJ SOSY
40.0000 mg | PREFILLED_SYRINGE | INTRAMUSCULAR | Status: DC
  Administered 2021-10-29 – 2021-10-30 (×3): 40 mg via SUBCUTANEOUS
  Filled 2021-10-28 (×3): qty 0.4

## 2021-10-28 MED ORDER — MAGNESIUM OXIDE -MG SUPPLEMENT 400 (240 MG) MG PO TABS
400.0000 mg | ORAL_TABLET | Freq: Every day | ORAL | Status: DC
  Administered 2021-10-29 – 2021-10-31 (×3): 400 mg via ORAL
  Filled 2021-10-28 (×3): qty 1

## 2021-10-28 MED ORDER — FLUOROMETHOLONE 0.1 % OP SUSP
1.0000 [drp] | Freq: Two times a day (BID) | OPHTHALMIC | Status: DC
  Administered 2021-10-29: 1 [drp] via OPHTHALMIC
  Filled 2021-10-28: qty 5

## 2021-10-28 MED ORDER — ACETAMINOPHEN 325 MG PO TABS
650.0000 mg | ORAL_TABLET | Freq: Four times a day (QID) | ORAL | Status: DC | PRN
  Administered 2021-10-29: 650 mg via ORAL
  Filled 2021-10-28: qty 2

## 2021-10-28 MED ORDER — ESCITALOPRAM OXALATE 20 MG PO TABS
20.0000 mg | ORAL_TABLET | Freq: Every day | ORAL | Status: DC
  Administered 2021-10-29 – 2021-10-31 (×3): 20 mg via ORAL
  Filled 2021-10-28 (×2): qty 1
  Filled 2021-10-28: qty 2

## 2021-10-28 MED ORDER — LISINOPRIL 10 MG PO TABS
10.0000 mg | ORAL_TABLET | Freq: Every day | ORAL | Status: DC
  Administered 2021-10-29 – 2021-10-31 (×3): 10 mg via ORAL
  Filled 2021-10-28 (×3): qty 1

## 2021-10-28 MED ORDER — SENNOSIDES-DOCUSATE SODIUM 8.6-50 MG PO TABS: 1.0000 | ORAL_TABLET | Freq: Every evening | ORAL | Status: DC | PRN

## 2021-10-28 MED ORDER — AMLODIPINE BESYLATE 5 MG PO TABS
5.0000 mg | ORAL_TABLET | Freq: Every day | ORAL | Status: DC
  Administered 2021-10-29 – 2021-10-31 (×3): 5 mg via ORAL
  Filled 2021-10-28 (×3): qty 1

## 2021-10-28 MED ORDER — MOLNUPIRAVIR EUA 200MG CAPSULE
4.0000 | ORAL_CAPSULE | Freq: Two times a day (BID) | ORAL | Status: DC
  Administered 2021-10-29 – 2021-10-31 (×5): 800 mg via ORAL
  Filled 2021-10-28: qty 4

## 2021-10-28 MED ORDER — PREDNISONE 5 MG PO TABS
5.0000 mg | ORAL_TABLET | Freq: Every day | ORAL | Status: DC
  Administered 2021-10-29 – 2021-10-31 (×3): 5 mg via ORAL
  Filled 2021-10-28 (×3): qty 1

## 2021-10-28 NOTE — H&P (Signed)
History and Physical    Mckenzie [name] WGY:[id number] DOB: 09/13/1946 DOA: 10/28/2021  PCP: Loman Brooklyn, FNP  Patient coming from: Home via EMS  I have personally briefly reviewed patient's old medical records in [place]  Chief Complaint: Shortness of breath  HPI: Mckenzie [name] is a 76 y.o. female with medical history significant for giant cell arteritis on prednisone 5 mg daily, history of CVA, HTN, HLD, indwelling Foley catheter who presented to the ED for evaluation of shortness of breath and generalized weakness.  History is supplemented by daughters at bedside and and over the phone.  Patient has had 2 days of shortness of breath, nonproductive cough, chest congestion, and generalized weakness.  Family checked her oxygen at home and noted that it was getting as low as 84% on room air.  Patient normally is ambulating with a walker minimally and only in the house since recent thoracolumbar surgery.  Over the last day she has difficulty with standing.  She had an elevated temperature of 100.13F at home.  They took an at home COVID-19 test which was negative.  They initially went to urgent care and she was again found to be hypoxic with SPO2 in the 80s.  Daughter states that patient has had Foley catheter in place since her recent surgery.  She just completed a course of antibiotics for reported UTI.  Patient reports nausea today without emesis.  She denies any abdominal pain.  She has had some diarrhea the last 2 days.  ED Course:  Initial vitals show BP 147/73, pulse 85, RR 20, temp 99.4 F, SPO2 initially 93% on room air.  SPO2 90% on room air while standing and subsequently dropped to 88-89% while at rest.  Patient was placed on 2 L supplemental O2 via [place].  Labs show WBC 8.2, hemoglobin 11.2, platelets 438,000, sodium 134, potassium 4.0, bicarb 27, BUN 9, creatinine 0.59, serum glucose 124, troponin 7 >> 6.  Urinalysis shows negative nitrites, large leukocytes, >50  RBCs and WBC/hpf, rare bacteria on microscopy.  Urine culture ordered and pending.  SARS-CoV-2 PCR is positive.  Influenza negative.  Portable chest x-ray negative for focal consolidation, edema, effusion.  Thoracolumbar spinal rods and fixating screws noted.  The hospitalist service was consulted to admit for further evaluation and management.  Review of Systems: All systems reviewed and are negative except as documented in history of present illness above.   Past Medical History:  Diagnosis Date   Anemia    low iron   Arthritis    Depression    High cholesterol    Hypertension    Retinal micro-aneurysm of right eye    Stroke ([place])    2 or 3 mini strokes    Past Surgical History:  Procedure Laterality Date   ARTERY BIOPSY Bilateral 12/15/2020   Procedure: BILATERAL TEMPORAL ARTERY BIOPSIES;  Surgeon: Angelia Mould, MD;  [facility];  Service: Vascular;  Laterality: Bilateral;   COLONOSCOPY WITH PROPOFOL N/A 12/09/2020   Procedure: COLONOSCOPY WITH PROPOFOL;  Surgeon: Gatha Mayer, MD;  [facility] ENDOSCOPY;  Service: Endoscopy;  Laterality: N/A;   ESOPHAGOGASTRODUODENOSCOPY (EGD) WITH PROPOFOL N/A 12/09/2020   Procedure: ESOPHAGOGASTRODUODENOSCOPY (EGD) WITH PROPOFOL;  Surgeon: Gatha Mayer, MD;  Location: [place];  Service: Endoscopy;  Laterality: N/A;   EYE SURGERY     FINGER SURGERY Left    RING FINGER   IR THORACENTESIS ASP PLEURAL SPACE W/IMG GUIDE  12/13/2020   OPEN REDUCTION INTERNAL FIXATION (  ORIF) DISTAL RADIAL FRACTURE Left 10/05/2020   Procedure: OPEN REDUCTION INTERNAL FIXATION (ORIF) DISTAL RADIAL FRACTURE;  Surgeon: Shona Needles, MD;  Location: [place];  Service: Orthopedics;  Laterality: Left;  supraclavicular block   POLYPECTOMY  12/09/2020   Procedure: POLYPECTOMY;  Surgeon: Gatha Mayer, MD;  Location: [place];  Service: Endoscopy;;   SPINAL FUSION  08/10/2021   T10-L2   TUBAL LIGATION      Social History:  reports  that she quit smoking about 41 years ago. Her smoking use included cigarettes. She has a 0.10 pack-year smoking history. She has never used smokeless tobacco. She reports that she does not drink alcohol and does not use drugs.  Allergies  Allergen Reactions   Aldomet [Methyldopa] Other (See Comments)    Flu-like symptoms   Fosamax [Alendronate Sodium] Other (See Comments)    Weakness/fatigue    Plavix [Clopidogrel] Other (See Comments)    Arm swelled, bleeding   Acetazolamide Rash   Buspar [Buspirone] Palpitations    Family History  Problem Relation Age of Onset   Hyperlipidemia Mother    Hypertension Mother    Diabetes Mother    COPD Father    Depression Sister    Suicidality Brother    Drug abuse Brother    Healthy Daughter    Healthy Daughter    Healthy Daughter    Colon cancer Neg Hx    Celiac disease Neg Hx    Inflammatory bowel disease Neg Hx      Prior to Admission medications   Medication Sig Start Date End Date Taking? Authorizing Provider  acetaminophen (TYLENOL) 325 MG tablet Take 2 tablets (650 mg total) by mouth every 6 (six) hours as needed for mild pain (or Fever >/= 101). 05/27/21   Jennye Boroughs, MD  amLODipine (NORVASC) 5 MG tablet Take 1 tablet (5 mg total) by mouth daily. 07/10/21   Loman Brooklyn, FNP  aspirin EC 81 MG EC tablet Take 1 tablet (81 mg total) by mouth daily. X one month, then stop 07/02/21   Tat, Shanon Brow, MD  atorvastatin (LIPITOR) 40 MG tablet Take 1 tablet (40 mg total) by mouth daily. 07/02/21   Orson Eva, MD  Bromfenac Sodium 0.07 % SOLN Place 1 drop into the right eye every evening.    [provider]  Calcium Carb-Cholecalciferol (CALCIUM+D3 PO) Take 2 tablets by mouth in the morning.    [provider]  COMBIGAN 0.2-0.5 % ophthalmic solution Place 1 drop into both eyes every 12 (twelve) hours. 12/01/13   [provider]  dorzolamide (TRUSOPT) 2 % ophthalmic solution Place 1 drop into the left eye 2 (two) times  daily.    [provider]  Ergocalciferol (VITAMIN D2) 50 MCG (2000 UT) TABS Take 1 tablet by mouth daily. 04/30/21   Sater, Nanine Means, MD  escitalopram (LEXAPRO) 20 MG tablet Take 1 tablet (20 mg total) by mouth daily. 01/18/21   Loman Brooklyn, FNP  ferrous sulfate 325 (65 FE) MG tablet Take 325 mg by mouth 3 (three) times a week.    [provider]  fluorometholone (FML) 0.1 % ophthalmic suspension Place 1 drop into the right eye 2 (two) times daily.    [provider]  furosemide (LASIX) 20 MG tablet Take 10 mg by mouth in the morning.    [provider]  latanoprost (XALATAN) 0.005 % ophthalmic solution Place 1 drop into both eyes at bedtime. 12/29/15   [provider]  lisinopril (  ZESTRIL) 10 MG tablet Take 1 tablet (10 mg total) by mouth daily. 06/22/21   Loman Brooklyn, FNP  magnesium oxide (MAG-OX) 400 MG tablet Take 400 mg by mouth in the morning.    [provider]  Multiple Vitamin (MULTIVITAMIN WITH MINERALS) TABS tablet Take 1 tablet by mouth in the morning. One A Day Multivitamin    [provider]    Physical Exam: Vitals:   10/28/21 1451 10/28/21 1453 10/28/21 1730  BP: (!) 147/73  (!) 154/61  Pulse: 85  87  Resp: 20  (!) 24  Temp: 99.4 F (37.4 C)    TempSrc: Oral    SpO2: 93%  97%  Weight:  59 kg   Height:  5\' 5"  (1.651 m)    Constitutional: Elderly woman resting in bed, NAD, calm, comfortable Eyes: PERRL, lids and conjunctivae normal ENMT: Mucous membranes are dry. Posterior pharynx clear of any exudate or lesions.Normal dentition.  Neck: normal, supple, no masses. Respiratory: clear to auscultation bilaterally, no wheezing, no crackles. Normal respiratory effort while on 2 L O2 via [place]. No accessory muscle use.  Cardiovascular: Regular rate and rhythm, no murmurs / rubs / gallops. No extremity edema. 2+ pedal pulses. Abdomen: no tenderness, no masses palpated. No hepatosplenomegaly. Bowel sounds  positive.  Musculoskeletal: no clubbing / cyanosis. No joint deformity upper and lower extremities. Good ROM, no contractures. Normal muscle tone.  Skin: no rashes, lesions, ulcers. No induration Neurologic: CN 2-12 grossly intact. Sensation intact. Strength 5/5 in all 4.  Psychiatric: Normal judgment and insight. Alert and oriented x 3. Normal mood.   Labs on Admission: I have personally reviewed following labs and imaging studies  CBC: Recent Labs  Lab 10/28/21 1730  WBC 8.2  NEUTROABS 6.8  HGB 11.2*  HCT 36.3  MCV 89.0  PLT 132*   Basic Metabolic Panel: Recent Labs  Lab 10/28/21 1730  NA 134*  K 4.0  CL 97*  CO2 27  GLUCOSE 124*  BUN 9  CREATININE 0.59  CALCIUM 9.5   GFR: Estimated Creatinine Clearance: 53.8 mL/min (by C-G formula based on SCr of 0.59 mg/dL). Liver Function Tests: No results for input(s): AST, ALT, ALKPHOS, BILITOT, PROT, ALBUMIN in the last 168 hours. No results for input(s): LIPASE, AMYLASE in the last 168 hours. No results for input(s): AMMONIA in the last 168 hours. Coagulation Profile: No results for input(s): INR, PROTIME in the last 168 hours. Cardiac Enzymes: No results for input(s): CKTOTAL, CKMB, CKMBINDEX, TROPONINI in the last 168 hours. BNP (last 3 results) No results for input(s): PROBNP in the last 8760 hours. HbA1C: No results for input(s): HGBA1C in the last 72 hours. CBG: No results for input(s): GLUCAP in the last 168 hours. Lipid Profile: No results for input(s): CHOL, HDL, LDLCALC, TRIG, CHOLHDL, LDLDIRECT in the last 72 hours. Thyroid Function Tests: No results for input(s): TSH, T4TOTAL, FREET4, T3FREE, THYROIDAB in the last 72 hours. Anemia Panel: No results for input(s): VITAMINB12, FOLATE, FERRITIN, TIBC, IRON, RETICCTPCT in the last 72 hours. Urine analysis:    Component Value Date/Time   COLORURINE AMBER (A) 10/28/2021 1730   APPEARANCEUR CLOUDY (A) 10/28/2021 1730   APPEARANCEUR Clear 07/05/2021 1415    LABSPEC 1.016 10/28/2021 1730   PHURINE 7.0 10/28/2021 1730   GLUCOSEU NEGATIVE 10/28/2021 1730   HGBUR SMALL (A) 10/28/2021 1730   BILIRUBINUR NEGATIVE 10/28/2021 1730   BILIRUBINUR Negative 07/05/2021 1415   KETONESUR NEGATIVE 10/28/2021 1730   PROTEINUR 100 (A) 10/28/2021 1730  UROBILINOGEN negative 05/13/2015 0827   NITRITE NEGATIVE 10/28/2021 1730   LEUKOCYTESUR LARGE (A) 10/28/2021 1730    Radiological Exams on Admission: DG Chest Port 1 View  Result Date: 10/28/2021 CLINICAL DATA:  Shortness of breath EXAM: PORTABLE CHEST 1 VIEW COMPARISON:  07/20/2021 FINDINGS: Borderline to mild cardiomegaly. Atelectasis or scar at the left base. No consolidation, pleural effusion, or pneumothorax. Spinal rods and fixating screws at the thoracolumbar spine. IMPRESSION: No active disease. Cardiomegaly with mild scarring or atelectasis at the left base. Electronically Signed   By: Donavan Foil M.D.   On: 10/28/2021 15:34    EKG: Personally reviewed. Normal sinus rhythm without acute ischemic changes.  Similar to prior.  Assessment/Plan Principal Problem:   COVID-19 viral infection with hypoxia Active Problems:   Generalized weakness   Essential hypertension   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HCC)   History of CVA (cerebrovascular accident)   Hyperlipidemia   Foley catheter in place prior to arrival   Adjustment disorder with mixed anxiety and depressed mood   Mckenzie [name] is a 76 y.o. female with medical history significant for giant cell arteritis, history of CVA, HTN, HLD who is admitted with COVID-19 viral infection.  * COVID-19 viral infection with hypoxia- (present on admission) SARS-CoV-2 PCR positive on admission.  She has been intermittently hypoxic with SPO2 88-89% on room air while at rest and with standing.  CXR without evidence of pneumonia or infiltrate.  Currently requiring 2 L supplemental O2 via [place] to maintain SPO2 >94%. -Start on [place] -Continue supplemental oxygen as  needed  Generalized weakness Has baseline poor mobility since recent thoracolumbar surgery, using walker to get around the home.  Deconditioned in setting of COVID-19 viral infection. -PT/OT eval -Fall precautions  Essential hypertension- (present on admission) Continue amlodipine, lisinopril.  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[place])- (present on admission) On chronic prednisone 5 mg daily, continue.  History of CVA (cerebrovascular accident) Patient no longer taking aspirin or statin.  Hyperlipidemia- (present on admission) Patient no longer taking statin.  Foley catheter in place prior to arrival- (present on admission) Per family, recently completed Keflex antibiotic course for UTI.  Continue Foley care.  Adjustment disorder with mixed anxiety and depressed mood- (present on admission) Continue Lexapro.   DVT prophylaxis: enoxaparin (LOVENOX) injection 40 mg Start: 10/28/21 2100 Code Status: DNI, confirmed on admission Family Communication: Discussed with daughters at bedside and over phone Disposition Plan: From home and likely discharge to home pending clinical progress Consults called: None Level of care: Med-Surg Admission status:  Status is: Observation  The patient remains OBS appropriate and will d/c before 2 midnights.   Zada Finders MD [facility]  If 7PM-7AM, please contact night-coverage [email or web address]  10/28/2021, 9:21 PM

## 2021-10-28 NOTE — Assessment & Plan Note (Addendum)
BP stable-continue amlodipine, lisinopril.

## 2021-10-28 NOTE — Assessment & Plan Note (Signed)
On chronic prednisone 5 mg daily, continue. 

## 2021-10-28 NOTE — Assessment & Plan Note (Addendum)
Apparently has a history of nocturia/stress incontinence-has seen urology in the past.  Has a Foley catheter since November 2022 that was left in place after thoracic spine surgery.  Per family-this was left in because of significant nocturia.  Patient already had an episode of UTI-and was on Keflex prior to this hospitalization.  Long discussion with family on 1/23-different options discussed-option of leaving catheter versus removing catheter and using purewick at night (at home) discussed in detail.  Family elected to remove catheter-she is voiding spontaneously.  However external catheter/purewick not covered by patient's insurance-family will use some sort of diaper at night-and will ensure that the patient voids in a  Bedside commode during the daytime.  Although patient's urine culture drawn on admission is positive for Pseudomonas-it is highly likely that this is a colonization.  Extensive discussion with spouse at bedside on 1/24-even though he understands this is a colonization-is requesting treatment-we will give 1 dose of fosfomycin.  I have urged him to follow-up with patient's urologist post discharge.

## 2021-10-28 NOTE — ED Triage Notes (Signed)
Pt bib [facility] from home where she lives with her husband. Pt arrives with complaints of congestion and shob that started this morning. Pt family checked her room air oxygen saturation which were 88%. Pt placed on 2 L [place] with bringing her up to 94%. Pt family reports her feeling bad for a week with chest pain and a dry cough with a fever today. Pt does report a family member does have [place]. EMS vitals: 112/58, 96HR, 98% 2LNC, 26R

## 2021-10-28 NOTE — Assessment & Plan Note (Addendum)
Generalized weakness but nonfocal exam-Per prior notes-no longer on aspirin or statin.

## 2021-10-28 NOTE — Assessment & Plan Note (Signed)
Continue Lexapro

## 2021-10-28 NOTE — Assessment & Plan Note (Addendum)
No longer on statin-defer further to outpatient setting.

## 2021-10-28 NOTE — Assessment & Plan Note (Addendum)
Has chronic debility since her recent thoracic spine surgery in November 2022-now much more deconditioned than usual due to COVID-19 infection.  PT/OT eval appreciated-plans are for home health on discharge.

## 2021-10-28 NOTE — Assessment & Plan Note (Addendum)
Minimal hypoxia-mostly on room air this morning.  CXR with ?  LLL atelectasis/pneumonia this morning-but patient is overall clinically improved.  Continue  Molnupiravir.   D-dimer slightly elevated-doubt VTE but given sedentary lifestyle-we will obtain lower extremity Doppler.  If this is negative-doubt further work-up is required.  She is at risk for severe disease given immunocompromise status (chronic steroid use for approximately a year)

## 2021-10-28 NOTE — ED Notes (Signed)
O2 92% on RA while in bed. Pt unable to ambulate, stood with assistance from this NT and daughter and use of walker. Pt attempted to shuffle her feet but was unable to take a step. Daughter states that the pt does not walk a lot at home and that she uses a walker at baseline. O2 90% on RA while standing.

## 2021-10-28 NOTE — ED Provider Notes (Signed)
[facility]PART[facility] Provider Note   CSN: [id number] Arrival date & time: 10/28/21  1440     History  Chief Complaint  Patient presents with   Shortness of Breath    Mckenzie [name] is a 76 y.o. female.  Patient is a 76 year old female who presents with shortness of breath.  Family states that she has had a cough and some congestion for the last 2 or 3 days.  She has had her oxygen level checked at home and was noted to be in the 80s at 1 point.  They went to urgent care and also found to be 88% on room air at urgent care.  She is not on home oxygen currently.  Has had some nausea but no vomiting.  She has had some loose stools although she has been on antibiotics for a possible UTI and felt that it might be related to that.  She had a fever today at home.  Family also states that she has had some intermittent confusion over the last 3 to 4 months.  They felt that it was due to urinary tract infections but it has been going on for several months.  She seemed to have been more confused over the last couple of days.  No recent falls.  She has been complaining of some chest pain with the coughing.      Home Medications Prior to Admission medications   Medication Sig Start Date End Date Taking? Authorizing Provider  acetaminophen (TYLENOL) 325 MG tablet Take 2 tablets (650 mg total) by mouth every 6 (six) hours as needed for mild pain (or Fever >/= 101). 05/27/21   Jennye Boroughs, MD  amLODipine (NORVASC) 5 MG tablet Take 1 tablet (5 mg total) by mouth daily. 07/10/21   Loman Brooklyn, FNP  aspirin EC 81 MG EC tablet Take 1 tablet (81 mg total) by mouth daily. X one month, then stop 07/02/21   Tat, Shanon Brow, MD  atorvastatin (LIPITOR) 40 MG tablet Take 1 tablet (40 mg total) by mouth daily. 07/02/21   Orson Eva, MD  Bromfenac Sodium 0.07 % SOLN Place 1 drop into the right eye every evening.    [provider]  Calcium Carb-Cholecalciferol (CALCIUM+D3 PO)  Take 2 tablets by mouth in the morning.    [provider]  COMBIGAN 0.2-0.5 % ophthalmic solution Place 1 drop into both eyes every 12 (twelve) hours. 12/01/13   [provider]  dorzolamide (TRUSOPT) 2 % ophthalmic solution Place 1 drop into the left eye 2 (two) times daily.    [provider]  Ergocalciferol (VITAMIN D2) 50 MCG (2000 UT) TABS Take 1 tablet by mouth daily. 04/30/21   Sater, Nanine Means, MD  escitalopram (LEXAPRO) 20 MG tablet Take 1 tablet (20 mg total) by mouth daily. 01/18/21   Loman Brooklyn, FNP  ferrous sulfate 325 (65 FE) MG tablet Take 325 mg by mouth 3 (three) times a week.    [provider]  fluorometholone (FML) 0.1 % ophthalmic suspension Place 1 drop into the right eye 2 (two) times daily.    [provider]  furosemide (LASIX) 20 MG tablet Take 10 mg by mouth in the morning.    [provider]  latanoprost (XALATAN) 0.005 % ophthalmic solution Place 1 drop into both eyes at bedtime. 12/29/15   [provider]  lisinopril (ZESTRIL) 10 MG tablet Take 1 tablet (10 mg total) by mouth daily. 06/22/21   Hendricks Limes  F, FNP  magnesium oxide (MAG-OX) 400 MG tablet Take 400 mg by mouth in the morning.    [provider]  Multiple Vitamin (MULTIVITAMIN WITH MINERALS) TABS tablet Take 1 tablet by mouth in the morning. One A Day Multivitamin    [provider]      Allergies    Aldomet [methyldopa], Fosamax [alendronate sodium], Plavix [clopidogrel], Acetazolamide, and Buspar [buspirone]    Review of Systems   Review of Systems  Constitutional:  Positive for fatigue and fever. Negative for chills and diaphoresis.  HENT:  Negative for congestion, rhinorrhea and sneezing.   Eyes: Negative.   Respiratory:  Positive for cough and shortness of breath. Negative for chest tightness.   Cardiovascular:  Positive for chest pain. Negative for leg swelling.  Gastrointestinal:  Positive for diarrhea and  nausea. Negative for abdominal pain, blood in stool and vomiting.  Genitourinary:  Negative for difficulty urinating, flank pain, frequency and hematuria.  Musculoskeletal:  Negative for arthralgias and back pain.  Skin:  Negative for rash.  Neurological:  Negative for dizziness, speech difficulty, weakness, numbness and headaches.  Psychiatric/Behavioral:  Positive for confusion.    Physical Exam Updated Vital Signs BP (!) 154/61    Pulse 87    Temp 99.4 F (37.4 C) (Oral)    Resp (!) 24    Ht 5\' 5"  (1.651 m)    Wt 59 kg    SpO2 97%    BMI 21.64 kg/m  Physical Exam Constitutional:      Appearance: She is well-developed.  HENT:     Head: Normocephalic and atraumatic.  Eyes:     Pupils: Pupils are equal, round, and reactive to light.  Cardiovascular:     Rate and Rhythm: Normal rate and regular rhythm.     Heart sounds: Normal heart sounds.  Pulmonary:     Effort: Pulmonary effort is normal. No respiratory distress.     Breath sounds: Normal breath sounds. No wheezing or rales.  Chest:     Chest wall: No tenderness.  Abdominal:     General: Bowel sounds are normal.     Palpations: Abdomen is soft.     Tenderness: There is no abdominal tenderness. There is no guarding or rebound.  Musculoskeletal:        General: Normal range of motion.     Cervical back: Normal range of motion and neck supple.  Lymphadenopathy:     Cervical: No cervical adenopathy.  Skin:    General: Skin is warm and dry.     Findings: No rash.  Neurological:     General: No focal deficit present.     Mental Status: She is alert and oriented to person, place, and time.     Comments: Patient is oriented to person place and month.  She tells me the year is 20.    ED Results / Procedures / Treatments   Labs (all labs ordered are listed, but only abnormal results are displayed) Labs Reviewed  RESP PANEL BY RT-PCR (FLU A&B, COVID) ARPGX2 - Abnormal; Notable for the following components:      Result Value    SARS Coronavirus 2 by RT PCR POSITIVE (*)    All other components within normal limits  BASIC METABOLIC PANEL - Abnormal; Notable for the following components:   Sodium 134 (*)    Chloride 97 (*)    Glucose, Bld 124 (*)    All other components within normal limits  CBC WITH DIFFERENTIAL/PLATELET - Abnormal;  Notable for the following components:   Hemoglobin 11.2 (*)    Platelets 438 (*)    All other components within normal limits  URINALYSIS, ROUTINE W REFLEX MICROSCOPIC - Abnormal; Notable for the following components:   Color, Urine AMBER (*)    APPearance CLOUDY (*)    Hgb urine dipstick SMALL (*)    Protein, ur 100 (*)    Leukocytes,Ua LARGE (*)    RBC / HPF >50 (*)    WBC, UA >50 (*)    Bacteria, UA RARE (*)    All other components within normal limits  URINE CULTURE  TROPONIN I (HIGH SENSITIVITY)  TROPONIN I (HIGH SENSITIVITY)    EKG EKG Interpretation  Date/Time:  Sunday October 28 2021 14:46:47 EST Ventricular Rate:  84 PR Interval:  149 QRS Duration: 87 QT Interval:  360 QTC Calculation: 426 R Axis:   -7 Text Interpretation: Sinus rhythm LVH by voltage Inferior infarct, age indeterminate since last tracing no significant change Confirmed by Malvin Johns [phone]) on 10/28/2021 3:54:06 PM  Radiology DG Chest Port 1 View  Result Date: 10/28/2021 CLINICAL DATA:  Shortness of breath EXAM: PORTABLE CHEST 1 VIEW COMPARISON:  07/20/2021 FINDINGS: Borderline to mild cardiomegaly. Atelectasis or scar at the left base. No consolidation, pleural effusion, or pneumothorax. Spinal rods and fixating screws at the thoracolumbar spine. IMPRESSION: No active disease. Cardiomegaly with mild scarring or atelectasis at the left base. Electronically Signed   By: Donavan Foil M.D.   On: 10/28/2021 15:34    Procedures Procedures    Medications Ordered in ED Medications - No data to display  ED Course/ Medical Decision Making/ A&P                           Medical Decision  Making Amount and/or Complexity of Data Reviewed Labs: ordered. Radiology: ordered.   Patient is a 76 year old female who presents with cough and some altered mental status.  She is alert and oriented x3 although does have some confusion.  No focal neurologic deficits.  Her chest x-ray is clear without evidence of pneumonia.  This was interpreted by me.  Her labs are nonconcerning.  Her urine does show some signs of infection but she has an indwelling Foley catheter which could be chronic changes.  She was just finishing a course of Keflex so I would not restart antibiotics at this point.  Urine was sent for culture.  Her COVID test is positive which is likely etiology of her symptoms.  She has some mild hypoxia with her oxygen saturations dropping down to 89% at times.  Was placed on nasal cannula oxygen at 2 L/min.  Given her hypoxia, I feel that she would benefit from inpatient treatment.  I consulted with Dr. Posey Pronto he will admit the patient for further treatment.  History was obtained from the patient long with other family members including the daughter and the patient's husband.  Chart was reviewed.  It looks like she took Keflex for her UTI and was prescribed a 7-day course starting on January 15.  Final Clinical Impression(s) / ED Diagnoses Final diagnoses:  FVCBS-49 virus infection  Hypoxia    Rx / DC Orders ED Discharge Orders     None         Malvin Johns, MD 10/28/21 2005

## 2021-10-29 DIAGNOSIS — Z825 Family history of asthma and other chronic lower respiratory diseases: Secondary | ICD-10-CM | POA: Diagnosis not present

## 2021-10-29 DIAGNOSIS — Z8673 Personal history of transient ischemic attack (TIA), and cerebral infarction without residual deficits: Secondary | ICD-10-CM | POA: Diagnosis not present

## 2021-10-29 DIAGNOSIS — R531 Weakness: Secondary | ICD-10-CM

## 2021-10-29 DIAGNOSIS — Z981 Arthrodesis status: Secondary | ICD-10-CM | POA: Diagnosis not present

## 2021-10-29 DIAGNOSIS — R0902 Hypoxemia: Secondary | ICD-10-CM | POA: Diagnosis present

## 2021-10-29 DIAGNOSIS — U071 COVID-19: Secondary | ICD-10-CM | POA: Diagnosis present

## 2021-10-29 DIAGNOSIS — J9811 Atelectasis: Secondary | ICD-10-CM | POA: Diagnosis present

## 2021-10-29 DIAGNOSIS — E876 Hypokalemia: Secondary | ICD-10-CM | POA: Diagnosis present

## 2021-10-29 DIAGNOSIS — Z8249 Family history of ischemic heart disease and other diseases of the circulatory system: Secondary | ICD-10-CM | POA: Diagnosis not present

## 2021-10-29 DIAGNOSIS — Z79899 Other long term (current) drug therapy: Secondary | ICD-10-CM | POA: Diagnosis not present

## 2021-10-29 DIAGNOSIS — F4323 Adjustment disorder with mixed anxiety and depressed mood: Secondary | ICD-10-CM | POA: Diagnosis present

## 2021-10-29 DIAGNOSIS — R7989 Other specified abnormal findings of blood chemistry: Secondary | ICD-10-CM | POA: Diagnosis not present

## 2021-10-29 DIAGNOSIS — N393 Stress incontinence (female) (male): Secondary | ICD-10-CM | POA: Diagnosis present

## 2021-10-29 DIAGNOSIS — Z83438 Family history of other disorder of lipoprotein metabolism and other lipidemia: Secondary | ICD-10-CM | POA: Diagnosis not present

## 2021-10-29 DIAGNOSIS — Z7952 Long term (current) use of systemic steroids: Secondary | ICD-10-CM | POA: Diagnosis not present

## 2021-10-29 DIAGNOSIS — R0602 Shortness of breath: Secondary | ICD-10-CM | POA: Diagnosis present

## 2021-10-29 DIAGNOSIS — Z7982 Long term (current) use of aspirin: Secondary | ICD-10-CM | POA: Diagnosis not present

## 2021-10-29 DIAGNOSIS — M316 Other giant cell arteritis: Secondary | ICD-10-CM | POA: Diagnosis present

## 2021-10-29 DIAGNOSIS — Z87891 Personal history of nicotine dependence: Secondary | ICD-10-CM | POA: Diagnosis not present

## 2021-10-29 DIAGNOSIS — Z833 Family history of diabetes mellitus: Secondary | ICD-10-CM | POA: Diagnosis not present

## 2021-10-29 DIAGNOSIS — Z978 Presence of other specified devices: Secondary | ICD-10-CM

## 2021-10-29 DIAGNOSIS — R351 Nocturia: Secondary | ICD-10-CM | POA: Diagnosis present

## 2021-10-29 DIAGNOSIS — I10 Essential (primary) hypertension: Secondary | ICD-10-CM

## 2021-10-29 DIAGNOSIS — D84821 Immunodeficiency due to drugs: Secondary | ICD-10-CM | POA: Diagnosis present

## 2021-10-29 DIAGNOSIS — Z888 Allergy status to other drugs, medicaments and biological substances status: Secondary | ICD-10-CM | POA: Diagnosis not present

## 2021-10-29 LAB — CBC WITH DIFFERENTIAL/PLATELET
Abs Immature Granulocytes: 0.03 10*3/uL (ref 0.00–0.07)
Basophils Absolute: 0 10*3/uL (ref 0.0–0.1)
Basophils Relative: 0 %
Eosinophils Absolute: 0 10*3/uL (ref 0.0–0.5)
Eosinophils Relative: 0 %
HCT: 33.4 % — ABNORMAL LOW (ref 36.0–46.0)
Hemoglobin: 10.9 g/dL — ABNORMAL LOW (ref 12.0–15.0)
Immature Granulocytes: 1 %
Lymphocytes Relative: 14 %
Lymphs Abs: 1 10*3/uL (ref 0.7–4.0)
MCH: 28.4 pg (ref 26.0–34.0)
MCHC: 32.6 g/dL (ref 30.0–36.0)
MCV: 87 fL (ref 80.0–100.0)
Monocytes Absolute: 0.7 10*3/uL (ref 0.1–1.0)
Monocytes Relative: 11 %
Neutro Abs: 4.9 10*3/uL (ref 1.7–7.7)
Neutrophils Relative %: 74 %
Platelets: 351 10*3/uL (ref 150–400)
RBC: 3.84 MIL/uL — ABNORMAL LOW (ref 3.87–5.11)
RDW: 13.9 % (ref 11.5–15.5)
WBC: 6.6 10*3/uL (ref 4.0–10.5)
nRBC: 0 % (ref 0.0–0.2)

## 2021-10-29 LAB — D-DIMER, QUANTITATIVE: D-Dimer, Quant: 1.87 ug/mL-FEU — ABNORMAL HIGH (ref 0.00–0.50)

## 2021-10-29 LAB — COMPREHENSIVE METABOLIC PANEL
ALT: 13 U/L (ref 0–44)
AST: 25 U/L (ref 15–41)
Albumin: 2.4 g/dL — ABNORMAL LOW (ref 3.5–5.0)
Alkaline Phosphatase: 100 U/L (ref 38–126)
Anion gap: 7 (ref 5–15)
BUN: 8 mg/dL (ref 8–23)
CO2: 24 mmol/L (ref 22–32)
Calcium: 8 mg/dL — ABNORMAL LOW (ref 8.9–10.3)
Chloride: 103 mmol/L (ref 98–111)
Creatinine, Ser: 0.49 mg/dL (ref 0.44–1.00)
GFR, Estimated: >60 mL/min (ref >60)
Glucose, Bld: 93 mg/dL (ref 70–99)
Potassium: 3.3 mmol/L — ABNORMAL LOW (ref 3.5–5.1)
Sodium: 134 mmol/L — ABNORMAL LOW (ref 135–145)
Total Bilirubin: 0.7 mg/dL (ref 0.3–1.2)
Total Protein: 5.9 g/dL — ABNORMAL LOW (ref 6.5–8.1)

## 2021-10-29 LAB — MAGNESIUM: Magnesium: 1.8 mg/dL (ref 1.7–2.4)

## 2021-10-29 LAB — FERRITIN: Ferritin: 128 ng/mL (ref 11–307)

## 2021-10-29 LAB — PHOSPHORUS: Phosphorus: 3.3 mg/dL (ref 2.5–4.6)

## 2021-10-29 LAB — C-REACTIVE PROTEIN: CRP: 7.7 mg/dL — ABNORMAL HIGH (ref <1.0)

## 2021-10-29 MED ORDER — MAGNESIUM SULFATE 2 GM/50ML IV SOLN
2.0000 g | Freq: Once | INTRAVENOUS | Status: AC
  Administered 2021-10-29: 2 g via INTRAVENOUS
  Filled 2021-10-29: qty 50

## 2021-10-29 NOTE — ED Notes (Signed)
Admitting provider Oren Binet, MD made aware of pt's daughter calling the insurance company & finding out what is required to approve pt having coverage for purewick use & removing her indwelling catheter. Provider responded that he will be reaching out to Case Management in that regard & this RN informed the husband & pt of his response.

## 2021-10-29 NOTE — Assessment & Plan Note (Addendum)
Repleted. °

## 2021-10-29 NOTE — Progress Notes (Signed)
PROGRESS NOTE        PATIENT DETAILS Name: Mckenzie [name] Age: 76 y.o. 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  See below for further details.    Significant events: 1/22>> admit for hypoxia-COVID-19 infection.  Microbiology: 1/22: Urine culture: Pending  Subjective: Lying comfortably in bed-denies any chest pain or shortness of breath.  Objective: Vitals: Blood pressure (!) 100/46, pulse 76, temperature 99.7 F (37.6 C), temperature source Oral, resp. rate 17, height 5\' 5"  (1.651 m), weight 59 kg, SpO2 99 %.   Exam: Gen Exam:Alert awake-not in any distress HEENT:atraumatic, normocephalic Chest: B/L clear to auscultation anteriorly CVS:S1S2 regular Abdomen:soft non tender, non distended Extremities:no edema Neurology: Non focal Skin: no rash  Pertinent Labs/Radiology: CBC Latest Ref Rng & Units 10/29/2021 10/28/2021 09/11/2021  WBC 4.0 - 10.5 K/uL 6.6 8.2 10.9(H)  Hemoglobin 12.0 - 15.0 g/dL 10.9(L) 11.2(L) 10.5(L)  Hematocrit 36.0 - 46.0 % 33.4(L) 36.3 32.9(L)  Platelets 150 - 400 K/uL 351 438(H) 272    Lab Results  Component Value Date   NA 134 (L) 10/29/2021   K 3.3 (L) 10/29/2021   CL 103 10/29/2021   CO2 24 10/29/2021      Assessment/Plan: * COVID-19 viral infection with hypoxia- (present on admission) Minimal hypoxia-feels better-continue Molnupiravir.  Ambulate and see how she does-if hypoxia worsens-may need to reimage to see if she has developed a pneumonia.  She is at risk for severe disease given  immunocompromise status (chronic steroid use for approximately a year)     Generalized weakness Has chronic debility since her recent thoracic spine surgery in November 2022-now much more deconditioned than usual due to COVID-19 infection.  Await PT/OT.    Hypokalemia Replete and recheck.  Hyperlipidemia- (present on admission) No longer on statin-defer further to outpatient setting.  Essential hypertension- (present on admission) BP stable-continue amlodipine, lisinopril.  History of CVA (cerebrovascular accident) Generalized weakness but nonfocal exam-Per prior notes-no longer on aspirin or statin.    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[place])- (present on admission) On chronic prednisone 5 mg daily, continue.  Adjustment disorder with mixed anxiety and depressed mood- (present on admission) Continue Lexapro.  Foley catheter in place prior to arrival- (present on admission) Per family, recently completed Keflex antibiotic course for UTI.  She does not have any symptoms of UTI-but apparently has had a Foley catheter since November 2022 after her thoracic spine surgery.  This is primarily due to severe nocturia.    Per family-outpatient physicians wanted to take out Foley catheter in the next few weeks.   Have discussed with daughter at bedside to see if family would want [place] to take the Foley catheter out while she is hospitalized-apparently it was left in place because of severe nocturia.  Apparently in the patients insurance has denied coverage of purewick. Family discussion in progress.  Compression fracture of T12 vertebra ([place])- (present on admission) Underwent spine surgery in November 2022.  Apparently has had a Foley catheter since then-due to worsening  nocturia.     BMI: Estimated body mass index is 21.64 kg/m as calculated from the following:   Height as of this encounter: 5\' 5"  (1.651 m).   Weight as of this encounter: 59 kg.    DVT Prophylaxis: SQ Lovenox Procedures:  None Consults: None Code Status:DNI Family Communication: Daughter at bedside   Disposition Plan: Status is: Observation  The patient will require care spanning > 2 midnights and should be moved to inpatient because: Severe debility-COVID-19 infection with mild hypoxemia-not yet stable for discharge-needs to be ambulated with therapy services to determine appropriate disposition given his significant fall risk.  Immunocompromised and risk for severe disease.    Diet: Diet Order             Diet Heart Room service appropriate? Yes; Fluid consistency: Thin  Diet effective now                     Antimicrobial agents: Anti-infectives (From admission, onward)    Start     Dose/Rate Route Frequency Ordered Stop   10/28/21 2200  molnupiravir EUA (LAGEVRIO) capsule 800 mg        4 capsule Oral 2 times daily 10/28/21 2049 11/02/21 2159        MEDICATIONS: Scheduled Meds:  amLODipine  5 mg Oral Daily   dorzolamide  1 drop Left Eye BID   enoxaparin (LOVENOX) injection  40 mg Subcutaneous Q24H   escitalopram  20 mg Oral Daily   fluorometholone  1 drop Right Eye BID   latanoprost  1 drop Both Eyes QHS   lisinopril  10 mg Oral Daily   magnesium oxide  400 mg Oral Daily   molnupiravir EUA  4 capsule Oral BID   predniSONE  5 mg Oral Q breakfast   Continuous Infusions: PRN Meds:.acetaminophen, guaiFENesin-dextromethorphan, ondansetron **OR** ondansetron (ZOFRAN) IV, senna-docusate   I have personally reviewed following labs and imaging studies  LABORATORY DATA: CBC: Recent Labs  Lab 10/28/21 1730 10/29/21 0345  WBC 8.2 6.6  NEUTROABS 6.8 4.9  HGB 11.2* 10.9*  HCT 36.3 33.4*  MCV 89.0 87.0  PLT 438* 284    Basic Metabolic Panel: Recent Labs  Lab 10/28/21 1730 10/29/21 0345  NA 134* 134*  K 4.0 3.3*  CL 97* 103  CO2 27 24  GLUCOSE 124* 93  BUN 9 8  CREATININE 0.59 0.49  CALCIUM 9.5 8.0*  MG  --  1.8  PHOS  --  3.3    GFR: Estimated Creatinine  Clearance: 53.8 mL/min (by C-G formula based on SCr of 0.49 mg/dL).  Liver Function Tests: Recent Labs  Lab 10/29/21 0345  AST 25  ALT 13  ALKPHOS 100  BILITOT 0.7  PROT 5.9*  ALBUMIN 2.4*   No results for input(s): LIPASE, AMYLASE in the last 168 hours. No results for input(s): AMMONIA in the last 168 hours.  Coagulation Profile: No results for input(s): INR, PROTIME in the last 168 hours.  Cardiac Enzymes: No results for input(s): CKTOTAL, CKMB, CKMBINDEX, TROPONINI in the last 168 hours.  BNP (last 3 results) No results for input(s): PROBNP in the last 8760 hours.  Lipid Profile: No results for input(s): CHOL, HDL, LDLCALC, TRIG, CHOLHDL, LDLDIRECT in the last 72 hours.  Thyroid Function Tests: No results for input(s): TSH, T4TOTAL, FREET4, T3FREE, THYROIDAB in the last 72 hours.  Anemia Panel: Recent Labs    10/29/21 0345  FERRITIN 128    Urine analysis:    Component Value  Date/Time   COLORURINE AMBER (A) 10/28/2021 1730   APPEARANCEUR CLOUDY (A) 10/28/2021 1730   APPEARANCEUR Clear 07/05/2021 1415   LABSPEC 1.016 10/28/2021 1730   PHURINE 7.0 10/28/2021 1730   GLUCOSEU NEGATIVE 10/28/2021 1730   HGBUR SMALL (A) 10/28/2021 1730   BILIRUBINUR NEGATIVE 10/28/2021 1730   BILIRUBINUR Negative 07/05/2021 1415   KETONESUR NEGATIVE 10/28/2021 1730   PROTEINUR 100 (A) 10/28/2021 1730   UROBILINOGEN negative 05/13/2015 0827   NITRITE NEGATIVE 10/28/2021 1730   LEUKOCYTESUR LARGE (A) 10/28/2021 1730    Sepsis Labs: Lactic Acid, Venous    Component Value Date/Time   LATICACIDVEN 1.5 07/20/2021 1936    MICROBIOLOGY: Recent Results (from the past 240 hour(s))  Resp Panel by RT-PCR (Flu A&B, Covid) Nasopharyngeal Swab     Status: Abnormal   Collection Time: 10/28/21  3:09 PM   Specimen: Nasopharyngeal Swab; Nasopharyngeal(NP) swabs in vial transport medium  Result Value Ref Range Status   SARS Coronavirus 2 by RT PCR POSITIVE (A) NEGATIVE Final    Comment:  (NOTE) SARS-CoV-2 target nucleic acids are DETECTED.  The SARS-CoV-2 RNA is generally detectable in upper respiratory specimens during the acute phase of infection. Positive results are indicative of the presence of the identified virus, but do not rule out bacterial infection or co-infection with other pathogens not detected by the test. Clinical correlation with patient history and other diagnostic information is necessary to determine patient infection status. The expected result is Negative.  Fact Sheet for Patients: EntrepreneurPulse.com.au  Fact Sheet for Healthcare Providers: IncredibleEmployment.be  This test is not yet approved or cleared by the [place] FDA and  has been authorized for detection and/or diagnosis of SARS-CoV-2 by FDA under an Emergency Use Authorization (EUA).  This EUA will remain in effect (meaning this test can be used) for the duration of  the COVID-19 declaration under Section 564(b)(1) of the A ct, 21 U.S.C. section 360bbb-3(b)(1), unless the authorization is terminated or revoked sooner.     Influenza A by PCR NEGATIVE NEGATIVE Final   Influenza B by PCR NEGATIVE NEGATIVE Final    Comment: (NOTE) The Xpert Xpress SARS-CoV-2/FLU/RSV plus assay is intended as an aid in the diagnosis of influenza from Nasopharyngeal swab specimens and should not be used as a sole basis for treatment. Nasal washings and aspirates are unacceptable for Xpert Xpress SARS-CoV-2/FLU/RSV testing.  Fact Sheet for Patients: EntrepreneurPulse.com.au  Fact Sheet for Healthcare Providers: IncredibleEmployment.be  This test is not yet approved or cleared by the [place] FDA and has been authorized for detection and/or diagnosis of SARS-CoV-2 by FDA under an Emergency Use Authorization (EUA). This EUA will remain in effect (meaning this test can be used) for the duration of the COVID-19  declaration under Section 564(b)(1) of the Act, 21 U.S.C. section 360bbb-3(b)(1), unless the authorization is terminated or revoked.  Performed at [facility], [place] [address] [place], [place] [address]     RADIOLOGY STUDIES/RESULTS: DG Chest Port 1 View  Result Date: 10/28/2021 CLINICAL DATA:  Shortness of breath EXAM: PORTABLE CHEST 1 VIEW COMPARISON:  07/20/2021 FINDINGS: Borderline to mild cardiomegaly. Atelectasis or scar at the left base. No consolidation, pleural effusion, or pneumothorax. Spinal rods and fixating screws at the thoracolumbar spine. IMPRESSION: No active disease. Cardiomegaly with mild scarring or atelectasis at the left base. Electronically Signed   By: Donavan Foil M.D.   On: 10/28/2021 15:34     LOS: 0 days   Oren Binet, MD  [facility]  To contact the attending provider between 7A-7P or the covering provider during after hours 7P-7A, please log into the web site [email or web address] and access using universal [place] password for that web site. If you do not have the password, please call the hospital operator.  10/29/2021, 10:27 AM

## 2021-10-29 NOTE — ED Notes (Signed)
Pt care taken, no complaints at this time. 

## 2021-10-29 NOTE — Progress Notes (Signed)
Mckenzie [name] [id number] Admission Data: 10/29/2021 3:20 PM Attending Provider: Jonetta Osgood, MD  [id number], Shireen Quan, FNP Consults/ Treatment Team:   Mckenzie [name] is a 76 y.o. female patient admitted from ED awake, alert  & orientated  X 3,  Partial Code, VSS - Blood pressure (!) 125/53, pulse 70, temperature 99.7 F (37.6 C), temperature source Oral, resp. rate 15, height 5\' 5"  (1.651 m), weight 59 kg, SpO2 98 %., O2 2L nasal cannular, no c/o shortness of breath, no c/o chest pain, no distress noted. Tele # [id number] placed and pt is currently running:normal sinus rhythm.   IV site WDL:  forearm right, condition patent and no redness with a transparent dsg that's clean dry and intact.  Pt orientation to unit, room and routine. Information packet given to patient/family and safety video watched.  Admission INP armband ID verified with patient/family, and in place. SR up x 2, fall risk assessment complete with Patient and family verbalizing understanding of risks associated with falls. Pt verbalizes an understanding of how to use the call bell and to call for help before getting out of bed.  Skin, clean-dry- intact without evidence of bruising, or skin tears.   No evidence of skin break down noted on exam.     Will cont to monitor and assist as needed.  Jayven Naill Shelda Pal, RN 10/29/2021 3:20 PM

## 2021-10-29 NOTE — ED Notes (Signed)
Breakfast order placed ?

## 2021-10-29 NOTE — Assessment & Plan Note (Addendum)
Underwent spine surgery in November 2022.

## 2021-10-29 NOTE — Evaluation (Signed)
Occupational Therapy Evaluation Patient Details Name: ZAMIRAH DENNY MRN: [id number] DOB: 12/07/1945 Today's Date: 10/29/2021   History of Present Illness Pt is a 76 y/o female presenting 1/22 with SOB and generalized weakness. Found COVID+.  PMH includes: anemia, arthritis, HTN, CVA, spinal fusion, ORIF L radius, giant cell arteritis.   Clinical Impression   PTA patient required assist from spouse for ADLs, limited mobility using RW.  She was admitted for above and presenting with problem list below, including weakness, decreased activity tolerance, impaired balance.  She is pleasant and follows commands, limited verbalizations or engagement during session due to fatigue (spouse answering all home setup and PLOF questions).  Patient currently requires up to mod assist +2 for bed mobility, min assist +2 for sit to stand and up to total assist for ADLs.  She is on 2L [place] during session with VSS.  Believe she will benefit from further OT services while admitted with plan to resume HHOT at dc to optimize activity tolerance, ADL engagement and decrease burden of care.       Recommendations for follow up therapy are one component of a multi-disciplinary discharge planning process, led by the attending physician.  Recommendations may be updated based on patient status, additional functional criteria and insurance authorization.   Follow Up Recommendations  Home health OT    Assistance Recommended at Discharge Frequent or constant Supervision/Assistance  Patient can return home with the following A lot of help with walking and/or transfers;A lot of help with bathing/dressing/bathroom;Assistance with cooking/housework;Direct supervision/assist for medications management;Direct supervision/assist for financial management;Assist for transportation;Help with stairs or ramp for entrance    Functional Status Assessment  Patient has had a recent decline in their functional status and demonstrates the  ability to make significant improvements in function in a reasonable and predictable amount of time.  Equipment Recommendations  None recommended by OT    Recommendations for Other Services       Precautions / Restrictions Precautions Precautions: Fall Restrictions Weight Bearing Restrictions: No      Mobility Bed Mobility Overal bed mobility: Needs Assistance Bed Mobility: Supine to Sit, Sit to Supine     Supine to sit: Mod assist, +2 for physical assistance, +2 for safety/equipment Sit to supine: Max assist, +2 for physical assistance, +2 for safety/equipment   General bed mobility comments: cueing for techniques and sequencing, able to initate but unable to complete due to waekness. Increased time.    Transfers Overall transfer level: Needs assistance Equipment used: 2 person hand held assist Transfers: Sit to/from Stand Sit to Stand: Min assist, +2 physical assistance, +2 safety/equipment, From elevated surface           General transfer comment: to power up and steady from edge of stretcher      Balance Overall balance assessment: Needs assistance Sitting-balance support: No upper extremity supported, Feet supported Sitting balance-Leahy Scale: Poor Sitting balance - Comments: posterior lean, min assist to min guard at best Postural control: Posterior lean Standing balance support: Bilateral upper extremity supported, During functional activity Standing balance-Leahy Scale: Poor Standing balance comment: relies on external support                           ADL either performed or assessed with clinical judgement   ADL Overall ADL's : Needs assistance/impaired     Grooming: Minimal assistance;Sitting           Upper Body Dressing : Minimal assistance;Sitting  Lower Body Dressing: +2 for physical assistance;+2 for safety/equipment;Total assistance;Sit to/from Health and safety inspector Details (indicate cue type and reason): min assist  +2 sit to stand, did not attempt further due to fatigeu         Functional mobility during ADLs: Moderate assistance;+2 for physical assistance;+2 for safety/equipment General ADL Comments: pt limited by weakness, decreased activity toelrance and balance     Vision   Vision Assessment?: No apparent visual deficits     Perception     Praxis      Pertinent Vitals/Pain Pain Assessment Pain Assessment: No/denies pain     Hand Dominance Right   Extremity/Trunk Assessment Upper Extremity Assessment Upper Extremity Assessment: Generalized weakness   Lower Extremity Assessment Lower Extremity Assessment: Defer to PT evaluation       Communication Communication Communication: HOH   Cognition Arousal/Alertness: Lethargic Behavior During Therapy: Flat affect Overall Cognitive Status: Difficult to assess                                 General Comments: pt following commands, limited verbalizations with decreased awareness to deficits.  Spouse quick to answer for patient.     General Comments  spouse present and supportive, pt on 2L [place] with VSS.    Exercises     Shoulder Instructions      Home Living Family/patient expects to be discharged to:: Private residence Living Arrangements: Spouse/significant other Available Help at Discharge: Family;Available 24 hours/day Type of Home: House Home Access: Ramped entrance     Home Layout: One level     Bathroom Shower/Tub: Teacher, early years/pre: Handicapped height     Home Equipment: Hospital bed;Rolling [place] (2 wheels);BSC/3in1;Shower seat;Wheelchair - manual;Tub bench          Prior Functioning/Environment Prior Level of Function : Needs assist       Physical Assist : Mobility (physical);ADLs (physical)   ADLs (physical): Bathing;Dressing;Toileting;IADLs Mobility Comments: husband reports pt needing assist for transfers, limited mobility with RW with a little assistance from  spouse ADLs Comments: Husband assisting with bathing and dressing.  pt completes grooming and feeding.        OT Problem List: Decreased strength;Impaired balance (sitting and/or standing);Decreased activity tolerance;Decreased safety awareness;Decreased knowledge of use of DME or AE;Decreased knowledge of precautions;Cardiopulmonary status limiting activity      OT Treatment/Interventions: Self-care/ADL training;Therapeutic exercise;DME and/or AE instruction;Therapeutic activities;Patient/family education;Balance training    OT Goals(Current goals can be found in the care plan section) Acute Rehab OT Goals Patient Stated Goal: back home OT Goal Formulation: With patient/family Time For Goal Achievement: 11/12/21 Potential to Achieve Goals: Good  OT Frequency: Min 2X/week    Co-evaluation PT/OT/SLP Co-Evaluation/Treatment: Yes Reason for Co-Treatment: For patient/therapist safety;To address functional/ADL transfers   OT goals addressed during session: ADL's and self-care      AM-PAC OT "6 Clicks" Daily Activity     Outcome Measure Help from another person eating meals?: A Little Help from another person taking care of personal grooming?: A Little Help from another person toileting, which includes using toliet, bedpan, or urinal?: Total Help from another person bathing (including washing, rinsing, drying)?: A Lot Help from another person to put on and taking off regular upper body clothing?: A Little Help from another person to put on and taking off regular lower body clothing?: Total 6 Click Score: 13   End of Session Nurse Communication:  Mobility status  Activity Tolerance: Patient limited by fatigue Patient left: in bed;with call bell/phone within reach;with nursing/sitter in room  OT Visit Diagnosis: Other abnormalities of gait and mobility (R26.89);Muscle weakness (generalized) (M62.81)                Time: 8832-5498 OT Time Calculation (min): 22 min Charges:  OT  General Charges $OT Visit: 1 Visit OT Evaluation $OT Eval Moderate Complexity: 1 Mod  Jolaine Artist, OT Acute Rehabilitation Services Pager [phone] Office [phone]   Delight Stare 10/29/2021, 12:01 PM

## 2021-10-29 NOTE — ED Notes (Signed)
5W called for purple man assignment.

## 2021-10-29 NOTE — Hospital Course (Signed)
Patient is a 76 year old female with history of giant cell arteritis-on chronic prednisone-who presented to the hospital with shortness of breath, cough, chest congestion, weakness, fever-Per family-patient O2 saturation at home was in the 80s-patient was brought to the ED where she was found to have acute COVID-19 infection.  See below for further details.    Significant events: 1/22>> admit for hypoxia-COVID-19 infection.  Microbiology: 1/22: Urine culture: Pending

## 2021-10-29 NOTE — Evaluation (Signed)
Physical Therapy Evaluation Patient Details Name: Mckenzie [name] MRN: [id number] DOB: 06/18/1946 Today's Date: 10/29/2021  History of Present Illness  Pt is a 76 y/o female presenting 1/22 with SOB and generalized weakness. Found COVID+.  PMH includes: anemia, arthritis, HTN, CVA, spinal fusion, ORIF L radius, giant cell arteritis.  Clinical Impression  Pt admitted secondary to problem above with deficits below. Pt fatiguing easily and required mod A +2 for bed mobility and min A +2 to stand at EOB. Per husband, he has been having to help her at home with mobility and ADL tasks. Recommend resumption of HHPT at d/c as they would prefer to go home. Will continue to follow acutely.        Recommendations for follow up therapy are one component of a multi-disciplinary discharge planning process, led by the attending physician.  Recommendations may be updated based on patient status, additional functional criteria and insurance authorization.  Follow Up Recommendations Home health PT    Assistance Recommended at Discharge Frequent or constant Supervision/Assistance  Patient can return home with the following  A lot of help with walking and/or transfers;A lot of help with bathing/dressing/bathroom;Assistance with cooking/housework;Direct supervision/assist for medications management;Direct supervision/assist for financial management;Help with stairs or ramp for entrance;Assist for transportation    Equipment Recommendations None recommended by PT  Recommendations for Other Services       Functional Status Assessment Patient has had a recent decline in their functional status and demonstrates the ability to make significant improvements in function in a reasonable and predictable amount of time.     Precautions / Restrictions Precautions Precautions: Fall Restrictions Weight Bearing Restrictions: No      Mobility  Bed Mobility Overal bed mobility: Needs Assistance Bed Mobility: Supine  to Sit, Sit to Supine     Supine to sit: Mod assist, +2 for physical assistance, +2 for safety/equipment Sit to supine: Max assist, +2 for physical assistance, +2 for safety/equipment   General bed mobility comments: cueing for techniques and sequencing, able to initate but unable to complete due to waekness. Increased time.    Transfers Overall transfer level: Needs assistance Equipment used: 2 person hand held assist Transfers: Sit to/from Stand Sit to Stand: Min assist, +2 physical assistance, +2 safety/equipment, From elevated surface           General transfer comment: to power up and steady from edge of stretcher. Pt wanting to defer further mobility.    Ambulation/Gait                  Stairs            Wheelchair Mobility    Modified Rankin (Stroke Patients Only)       Balance Overall balance assessment: Needs assistance Sitting-balance support: No upper extremity supported, Feet supported Sitting balance-Leahy Scale: Poor Sitting balance - Comments: posterior lean, min assist to min guard at best Postural control: Posterior lean Standing balance support: Bilateral upper extremity supported, During functional activity Standing balance-Leahy Scale: Poor Standing balance comment: relies on external support                             Pertinent Vitals/Pain Pain Assessment Pain Assessment: No/denies pain    Home Living Family/patient expects to be discharged to:: Private residence Living Arrangements: Spouse/significant other Available Help at Discharge: Family;Available 24 hours/day Type of Home: House Home Access: Ramped entrance       Home Layout: One  level Home Equipment: Hospital bed;Rolling Walker (2 wheels);BSC/3in1;Shower seat;Wheelchair - manual;Tub bench      Prior Function Prior Level of Function : Needs assist       Physical Assist : Mobility (physical);ADLs (physical)   ADLs (physical):  Bathing;Dressing;Toileting;IADLs Mobility Comments: husband reports pt needing assist for transfers, limited mobility with RW with a little assistance from spouse ADLs Comments: Husband assisting with bathing and dressing.  pt completes grooming and feeding.     Hand Dominance   Dominant Hand: Right    Extremity/Trunk Assessment   Upper Extremity Assessment Upper Extremity Assessment: Defer to OT evaluation    Lower Extremity Assessment Lower Extremity Assessment: Generalized weakness    Cervical / Trunk Assessment Cervical / Trunk Assessment: Kyphotic  Communication   Communication: HOH  Cognition Arousal/Alertness: Lethargic Behavior During Therapy: Flat affect Overall Cognitive Status: Difficult to assess                                 General Comments: pt following commands, limited verbalizations with decreased awareness to deficits.  Spouse quick to answer for patient.        General Comments General comments (skin integrity, edema, etc.): spouse present and supportive, pt on 2L [place] with VSS.    Exercises     Assessment/Plan    PT Assessment Patient needs continued PT services  PT Problem List Decreased strength;Decreased activity tolerance;Decreased balance;Decreased mobility;Decreased knowledge of use of DME;Decreased safety awareness;Decreased knowledge of precautions       PT Treatment Interventions DME instruction;Gait training;Functional mobility training;Therapeutic activities;Balance training;Therapeutic exercise;Patient/family education    PT Goals (Current goals can be found in the Care Plan section)  Acute Rehab PT Goals Patient Stated Goal: to go home per husband PT Goal Formulation: With patient/family Time For Goal Achievement: 11/12/21 Potential to Achieve Goals: Good    Frequency Min 3X/week     Co-evaluation PT/OT/SLP Co-Evaluation/Treatment: Yes Reason for Co-Treatment: For patient/therapist safety;To address  functional/ADL transfers PT goals addressed during session: Mobility/safety with mobility;Balance OT goals addressed during session: ADL's and self-care       AM-PAC PT "6 Clicks" Mobility  Outcome Measure Help needed turning from your back to your side while in a flat bed without using bedrails?: A Little Help needed moving from lying on your back to sitting on the side of a flat bed without using bedrails?: Total Help needed moving to and from a bed to a chair (including a wheelchair)?: Total Help needed standing up from a chair using your arms (e.g., wheelchair or bedside chair)?: Total Help needed to walk in hospital room?: Total Help needed climbing 3-5 steps with a railing? : Total 6 Click Score: 8    End of Session Equipment Utilized During Treatment: Gait belt Activity Tolerance: Patient limited by fatigue Patient left: in bed;with call bell/phone within reach;with family/visitor present (on stretcher in ED) Nurse Communication: Mobility status PT Visit Diagnosis: Unsteadiness on feet (R26.81);Muscle weakness (generalized) (M62.81);Difficulty in walking, not elsewhere classified (R26.2)    Time: 6948-5462 PT Time Calculation (min) (ACUTE ONLY): 22 min   Charges:   PT Evaluation $PT Eval Moderate Complexity: 1 Mod          Reuel Derby, PT, DPT  Acute Rehabilitation Services  Pager: [phone] Office: [phone]   Rudean Hitt 10/29/2021, 1:17 PM

## 2021-10-30 ENCOUNTER — Inpatient Hospital Stay (HOSPITAL_COMMUNITY): Payer: Medicare Other

## 2021-10-30 ENCOUNTER — Encounter (HOSPITAL_COMMUNITY): Payer: Self-pay | Admitting: Internal Medicine

## 2021-10-30 DIAGNOSIS — U071 COVID-19: Secondary | ICD-10-CM

## 2021-10-30 DIAGNOSIS — R7989 Other specified abnormal findings of blood chemistry: Secondary | ICD-10-CM

## 2021-10-30 LAB — C-REACTIVE PROTEIN: CRP: 4.3 mg/dL — ABNORMAL HIGH (ref <1.0)

## 2021-10-30 LAB — CBC
HCT: 32.8 % — ABNORMAL LOW (ref 36.0–46.0)
Hemoglobin: 10.9 g/dL — ABNORMAL LOW (ref 12.0–15.0)
MCH: 28.4 pg (ref 26.0–34.0)
MCHC: 33.2 g/dL (ref 30.0–36.0)
MCV: 85.4 fL (ref 80.0–100.0)
Platelets: 351 10*3/uL (ref 150–400)
RBC: 3.84 MIL/uL — ABNORMAL LOW (ref 3.87–5.11)
RDW: 13.9 % (ref 11.5–15.5)
WBC: 5.6 10*3/uL (ref 4.0–10.5)
nRBC: 0 % (ref 0.0–0.2)

## 2021-10-30 LAB — MAGNESIUM: Magnesium: 1.9 mg/dL (ref 1.7–2.4)

## 2021-10-30 LAB — D-DIMER, QUANTITATIVE: D-Dimer, Quant: 1.57 ug/mL-FEU — ABNORMAL HIGH (ref 0.00–0.50)

## 2021-10-30 LAB — BASIC METABOLIC PANEL
Anion gap: 10 (ref 5–15)
BUN: 12 mg/dL (ref 8–23)
CO2: 24 mmol/L (ref 22–32)
Calcium: 8.4 mg/dL — ABNORMAL LOW (ref 8.9–10.3)
Chloride: 98 mmol/L (ref 98–111)
Creatinine, Ser: 0.5 mg/dL (ref 0.44–1.00)
GFR, Estimated: >60 mL/min (ref >60)
Glucose, Bld: 87 mg/dL (ref 70–99)
Potassium: 3.6 mmol/L (ref 3.5–5.1)
Sodium: 132 mmol/L — ABNORMAL LOW (ref 135–145)

## 2021-10-30 MED ORDER — MOLNUPIRAVIR EUA 200MG CAPSULE: 4.0000 | ORAL_CAPSULE | Freq: Two times a day (BID) | ORAL | 0 refills | Status: AC

## 2021-10-30 MED ORDER — FOSFOMYCIN TROMETHAMINE 3 G PO PACK
3.0000 g | PACK | Freq: Once | ORAL | Status: AC
  Administered 2021-10-30: 16:00:00 3 g via ORAL
  Filled 2021-10-30: qty 3

## 2021-10-30 NOTE — Progress Notes (Signed)
Fosfomycin x1 for pseudomonas UTI.  Onnie Boer, PharmD, BCIDP, AAHIVP, CPP Infectious Disease Pharmacist 10/30/2021 2:10 PM

## 2021-10-30 NOTE — Plan of Care (Signed)

## 2021-10-30 NOTE — TOC Initial Note (Addendum)
Transition of Care [facility]) - Initial/Assessment Note    Patient Details  Name: Mckenzie [name] MRN: [id number] Date of Birth: 04-21-1946  Transition of Care [facility]) CM/SW Contact:    Cyndi Bender, RN Phone Number: 10/30/2021, 8:26 AM  Clinical Narrative:                 Spoke to husband regarding chronic foley and wanting RN-CM to find out if [facility] could get approved by insurance.  I called [employer] medicare and this isn't a DME that they cover. I gave the husband the number to [facility] which is a [employer].  Patient currently is active with [facility] will continue to follow for transition needs   930- I notifed daughter, Mckenzie [name], that the [facility] isn't a DME that [employer] ( or any insurance company) approves.      Patient Goals and CMS Choice        Expected Discharge Plan and Services                                                Prior Living Arrangements/Services                       Activities of Daily Living      Permission Sought/Granted                  Emotional Assessment              Admission diagnosis:  SOB (shortness of breath) [R06.02] Hypoxia [R09.02] COVID-19 virus infection [U07.1] Patient Active Problem List   Diagnosis Date Noted   COVID-19 viral infection with hypoxia 10/28/2021   Foley catheter in place prior to arrival 10/28/2021   Compression fracture of T12 vertebra ([place]) 08/10/2021   Hypokalemia 06/26/2021   History of CVA (cerebrovascular accident) 06/26/2021   Compression fracture of T1 vertebra ([place]) 05/25/2021   Compression fracture of T4 vertebra ([place]) 05/25/2021   Fracture of sacrum ([place]) 05/25/2021   Fracture of manubrium 05/25/2021   Depression 01/23/2021   High risk medication use 01/02/2021   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[place]) 12/27/2020   Abnormal MRI, cervical spine 12/27/2020   Generalized weakness 12/27/2020   Steroid-induced hyperglycemia 12/27/2020   Malnutrition of moderate  degree 12/11/2020   Benign neoplasm of cecum    Benign neoplasm of descending colon    Hiatal hernia    Thrombocytosis 12/08/2020   Hypoalbuminemia 12/08/2020   Constipation 12/06/2020   Anemia of chronic disease 12/06/2020   Gait disturbance 11/14/2020   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 11/14/2020   Cognitive change 11/14/2020   Adjustment disorder with mixed anxiety and depressed mood 10/10/2020   Distal radius fracture 10/05/2020   Proximal weakness of extremity 09/26/2020   Myalgia 09/26/2020   Fall at home, subsequent encounter 09/26/2020   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 [place] [address] Phone: [phone] Fax: [place], [place]. [place] [address] Phone: [phone] Fax: [phone]  Social Determinants of Health (SDOH) Interventions    Readmission Risk Interventions No flowsheet data found.

## 2021-10-30 NOTE — Progress Notes (Signed)
Bilateral lower extremity venous duplex completed. Refer to "CV Proc" under chart review to view preliminary results.  10/30/2021 4:09 PM Kelby Aline., MHA, RVT, RDCS, RDMS

## 2021-10-30 NOTE — Discharge Summary (Addendum)
PATIENT DETAILS Name: Mckenzie [name] Age: 76 y.o. Sex: female Date of Birth: 23-Jan-1946 MRN: [id number]. Admitting Physician: Jonetta Osgood, MD [id number], Shireen Quan, FNP  Admit Date: 10/28/2021 Discharge date: 10/31/2021  Recommendations for Outpatient Follow-up:  Follow up with PCP in 1-2 weeks Please obtain CMP/CBC in one week  Admitted From:  Home  Disposition: [place]: Yes  Equipment/Devices: None  Discharge Condition: Stable  CODE STATUS: DNI  Diet recommendation:  Diet Order             Diet - low sodium heart healthy           Diet Heart Room service appropriate? 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  See below for further details.     Significant events: 1/22>> admit for hypoxia-COVID-19 infection.   Microbiology: 1/22: Urine culture: Pseudomonas.    Brief Hospital Course: * COVID-19 viral infection with hypoxia- (present on admission) Minimal hypoxia-mostly on room air this morning.  CXR with ?  LLL atelectasis/pneumonia this morning-but patient is overall clinically improved.  Continue  Molnupiravir.   D-dimer slightly elevated-doubt VTE but given sedentary lifestyle-we will obtain lower extremity Doppler.  If this is negative-doubt further work-up is required.  She is at risk for severe disease given immunocompromise status (chronic steroid use for approximately a year)     Generalized weakness Has chronic debility since her recent thoracic spine surgery in November 2022-now much more deconditioned than usual due to COVID-19 infection.  PT/OT eval  appreciated-plans are for home health on discharge.  Hypokalemia Repleted.  Hyperlipidemia- (present on admission) No longer on statin-defer further to outpatient setting.  Essential hypertension- (present on admission) BP stable-continue amlodipine, lisinopril.  History of CVA (cerebrovascular accident) Generalized weakness but nonfocal exam-Per prior notes-no longer on aspirin or statin.    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[place])- (present on admission) On chronic prednisone 5 mg daily, continue.  Adjustment disorder with mixed anxiety and depressed mood- (present on admission) Continue Lexapro.  Foley catheter in place prior to arrival- (present on admission) Apparently has a history of nocturia/stress incontinence-has seen urology in the past.  Has a Foley catheter since November 2022 that was left in place after thoracic spine surgery.  Per family-this was left in because of significant nocturia.  Patient already had an episode of UTI-and was on Keflex prior to this hospitalization.  Long discussion with family on 1/23-different options discussed-option of leaving catheter versus removing catheter and using purewick at night (at home) discussed in detail.  Family elected to remove catheter-she is voiding spontaneously.  However external catheter/purewick not covered by patient's insurance-family will use some sort of diaper at night-and will ensure that the patient voids in a  Bedside commode during the daytime.  Although patient's urine culture drawn on admission is positive for Pseudomonas-it is highly likely that this is a colonization.  Extensive discussion with spouse at bedside on 1/24-even though he understands this is a colonization-is requesting treatment-we will give 1 dose of fosfomycin.  I have urged him to follow-up with patient's urologist post discharge.  Compression fracture of T12 vertebra ([place])- (present on admission) Underwent spine surgery in November 2022.       BMI: Estimated body mass index is 21.64 kg/m as calculated from the following:   Height as of this encounter: 5\' 5"  (1.651 m).   Weight as of this encounter: 59 kg.      Procedures None  Discharge Diagnoses:  Principal Problem:   COVID-19 viral infection with hypoxia Active Problems:   Generalized weakness   Hypokalemia   Essential hypertension   Hyperlipidemia   History of CVA (cerebrovascular accident)   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[facility])   Adjustment disorder with mixed anxiety and depressed mood   Foley catheter in place prior to arrival   Compression fracture of T12 vertebra [facility])   Discharge Instructions:  Activity:  As tolerated with Full fall precautions use walker/cane & assistance as needed   Discharge Instructions     Call MD for:  difficulty breathing, headache or visual disturbances   Complete by: As directed    Diet - low sodium heart healthy   Complete by: As directed    Discharge instructions   Complete by: As directed    Follow with Primary MD  Loman Brooklyn, FNP in 1-2 weeks  Please get a complete blood count and chemistry panel checked by your Primary MD at your next visit, and again as instructed by your Primary MD.  Get Medicines reviewed and adjusted: Please take all your medications with you for your next visit with your Primary MD  Laboratory/radiological data: Please request your Primary MD to go over all hospital tests and procedure/radiological results at the follow up, please ask your Primary MD to get all Hospital records sent to his/her office.  In some cases, they will be blood work, cultures and biopsy results pending at the time of your discharge. Please request that your primary care M.D. follows up on these results.  Also Note the following: If you experience worsening of your admission symptoms, develop shortness of breath, life threatening emergency, suicidal or homicidal thoughts you must seek medical attention immediately  by calling 911 or calling your MD immediately  if symptoms less severe.  You must read complete instructions/literature along with all the possible adverse reactions/side effects for all the Medicines you take and that have been prescribed to you. Take any new Medicines after you have completely understood and accpet all the possible adverse reactions/side effects.   Do not drive when taking Pain medications or sleeping medications (Benzodaizepines)  Do not take more than prescribed Pain, Sleep and Anxiety Medications. It is not advisable to combine anxiety,sleep and pain medications without talking with your primary care practitioner  Special Instructions: If you have smoked or chewed Tobacco  in the last 2 yrs please stop smoking, stop any regular Alcohol  and or any Recreational drug use.  Wear Seat belts while driving.  Please note: You were cared for by a hospitalist during your hospital stay. Once you are discharged, your primary care physician will handle any further medical issues. Please note that NO REFILLS for any discharge medications will be authorized once you are discharged, as it is imperative that you return to your primary care physician (or establish a relationship with a primary care physician if you do not have one) for your post hospital discharge needs so that they can reassess your need for medications and monitor your lab values.   Increase activity slowly   Complete by: As directed  Allergies as of 10/31/2021       Reactions   Aldomet [methyldopa] Other (See Comments)   Flu-like symptoms   Fosamax [alendronate Sodium] Other (See Comments)   Weakness/fatigue    Plavix [clopidogrel] Other (See Comments)   Arm swelled, bleeding   Acetazolamide Rash   Buspar [buspirone] Palpitations        Medication List     STOP taking these medications    atorvastatin 40 MG tablet Commonly known as: LIPITOR       TAKE these medications    acetaminophen 325  MG tablet Commonly known as: TYLENOL Take 2 tablets (650 mg total) by mouth every 6 (six) hours as needed for mild pain (or Fever >/= 101).   amLODipine 5 MG tablet Commonly known as: NORVASC Take 1 tablet (5 mg total) by mouth daily.   aspirin 81 MG EC tablet Take 1 tablet (81 mg total) by mouth daily. X one month, then stop   Bromfenac Sodium 0.07 % Soln Place 1 drop into the right eye every evening.   CALCIUM+D3 PO Take 2 tablets by mouth in the morning.   Combigan 0.2-0.5 % ophthalmic solution Generic drug: brimonidine-timolol Place 1 drop into both eyes every 12 (twelve) hours.   dorzolamide 2 % ophthalmic solution Commonly known as: TRUSOPT Place 1 drop into the left eye 2 (two) times daily.   escitalopram 20 MG tablet Commonly known as: LEXAPRO Take 1 tablet (20 mg total) by mouth daily.   ferrous sulfate 325 (65 FE) MG tablet Take 325 mg by mouth 3 (three) times a week.   fluorometholone 0.1 % ophthalmic suspension Commonly known as: FML Place 1 drop into the right eye 2 (two) times daily.   latanoprost 0.005 % ophthalmic solution Commonly known as: XALATAN Place 1 drop into both eyes at bedtime.   lisinopril 10 MG tablet Commonly known as: ZESTRIL Take 1 tablet (10 mg total) by mouth daily.   magnesium oxide 400 MG tablet Commonly known as: MAG-OX Take 400 mg by mouth in the morning.   molnupiravir EUA 200 mg Caps capsule Commonly known as: LAGEVRIO Take 4 capsules (800 mg total) by mouth 2 (two) times daily for 5 days.   multivitamin with minerals Tabs tablet Take 1 tablet by mouth in the morning. One A Day Multivitamin   PREDNISONE PO Take 1 tablet by mouth daily.   Vitamin D2 50 MCG (2000 UT) Tabs Take 1 tablet by mouth daily.        Follow-up Information     Loman Brooklyn, FNP. Schedule an appointment as soon as possible for a visit in 1 week(s).   Specialty: Family Medicine Contact information: [place]  [phone]                Allergies  Allergen Reactions   Aldomet [Methyldopa] Other (See Comments)    Flu-like symptoms   Fosamax [Alendronate Sodium] Other (See Comments)    Weakness/fatigue    Plavix [Clopidogrel] Other (See Comments)    Arm swelled, bleeding   Acetazolamide Rash   Buspar [Buspirone] Palpitations      Consultations:  None   Other Procedures/Studies: DG Chest Port 1 View  Result Date: 10/30/2021 CLINICAL DATA:  76 year old female with history of shortness of breath. EXAM: PORTABLE CHEST 1 VIEW COMPARISON:  Chest x-ray 10/28/2021. FINDINGS: Ill-defined opacity developing in the periphery of the left lung base partially obscuring the lateral left hemidiaphragm and the cardiac apex. Right  lung appears clear. No pneumothorax. Blunting of the left costophrenic sulcus. No right pleural effusion. No evidence of pulmonary edema. Heart size is borderline enlarged. The patient is rotated to the left on today's exam, resulting in distortion of the mediastinal contours and reduced diagnostic sensitivity and specificity for mediastinal pathology. Orthopedic fixation hardware throughout the lower thoracic and lumbar spine. IMPRESSION: 1. Interval development of opacity in the left lung base which may reflect atelectasis and/or consolidation, likely with small left-sided pleural effusion. Electronically Signed   By: Vinnie Langton M.D.   On: 10/30/2021 06:27   DG Chest Port 1 View  Result Date: 10/28/2021 CLINICAL DATA:  Shortness of breath EXAM: PORTABLE CHEST 1 VIEW COMPARISON:  07/20/2021 FINDINGS: Borderline to mild cardiomegaly. Atelectasis or scar at the left base. No consolidation, pleural effusion, or pneumothorax. Spinal rods and fixating screws at the thoracolumbar spine. IMPRESSION: No active disease. Cardiomegaly with mild scarring or atelectasis at the left base. Electronically Signed   By: Donavan Foil M.D.   On: 10/28/2021 15:34   VAS [place] LOWER  EXTREMITY VENOUS (DVT)  Result Date: 10/30/2021  Lower Venous DVT Study Patient Name:  Mckenzie [name]  Date of Exam:   10/30/2021 Medical Rec #: [id number]          Accession #:    [id number] Date of Birth: 30-Sep-1946           Patient Gender: F Patient Age:   38 years Exam Location:  [facility] Procedure:      VAS [place] LOWER EXTREMITY VENOUS (DVT) Referring Phys: Oren Binet --------------------------------------------------------------------------------  Indications: COVID, elevated d-dimer.  Limitations: Poor ultrasound/tissue interface. Comparison Study: No prior study Performing Technologist: Maudry Mayhew MHA, RDMS, RVT, RDCS  Examination Guidelines: A complete evaluation includes B-mode imaging, spectral Doppler, color Doppler, and power Doppler as needed of all accessible portions of each vessel. Bilateral testing is considered an integral part of a complete examination. Limited examinations for reoccurring indications may be performed as noted. The reflux portion of the exam is performed with the patient in reverse Trendelenburg.  +---------+---------------+---------+-----------+----------+--------------+  RIGHT     Compressibility Phasicity Spontaneity Properties Thrombus Aging  +---------+---------------+---------+-----------+----------+--------------+  CFV       Full            Yes       Yes                                    +---------+---------------+---------+-----------+----------+--------------+  SFJ       Full                                                             +---------+---------------+---------+-----------+----------+--------------+  FV Prox   Full                                                             +---------+---------------+---------+-----------+----------+--------------+  FV Mid    Full                                                             +---------+---------------+---------+-----------+----------+--------------+  FV Distal Full                                                              +---------+---------------+---------+-----------+----------+--------------+  PFV       Full                                                             +---------+---------------+---------+-----------+----------+--------------+  POP       Full            Yes       Yes                                    +---------+---------------+---------+-----------+----------+--------------+  PTV       Full                                                             +---------+---------------+---------+-----------+----------+--------------+  PERO      Full                                                             +---------+---------------+---------+-----------+----------+--------------+ Limited visualization PTV, peroneal veins  +---------+---------------+---------+-----------+----------+--------------+  LEFT      Compressibility Phasicity Spontaneity Properties Thrombus Aging  +---------+---------------+---------+-----------+----------+--------------+  CFV       Full            Yes       Yes                                    +---------+---------------+---------+-----------+----------+--------------+  SFJ       Full                                                             +---------+---------------+---------+-----------+----------+--------------+  FV Prox   Full                                                             +---------+---------------+---------+-----------+----------+--------------+  FV Mid    Full                                                             +---------+---------------+---------+-----------+----------+--------------+  FV Distal Full                                                             +---------+---------------+---------+-----------+----------+--------------+  PFV       Full                                                             +---------+---------------+---------+-----------+----------+--------------+  POP       Full            Yes       Yes                                     +---------+---------------+---------+-----------+----------+--------------+  PTV       Full                                                             +---------+---------------+---------+-----------+----------+--------------+  PERO      Full                                                             +---------+---------------+---------+-----------+----------+--------------+ Limited visualization PTV, peroneal veins    Summary: RIGHT: - There is no evidence of deep vein thrombosis in the lower extremity.  - No cystic structure found in the popliteal fossa.  LEFT: - There is no evidence of deep vein thrombosis in the lower extremity.  - No cystic structure found in the popliteal fossa.  *See table(s) above for measurements and observations. Electronically signed by Deitra Mayo MD on 10/30/2021 at 9:06:13 PM.    Final      TODAY-DAY OF DISCHARGE:  Subjective:   Mckenzie [name] today has no headache,no chest abdominal pain,no new weakness tingling or numbness, feels much better wants to go home today.   Objective:   Blood pressure 132/90, pulse 73, temperature 98 F (36.7 C), temperature source Oral, resp. rate 16, height 5\' 5"  (1.651 m), weight 59 kg, SpO2 96 %.  Intake/Output Summary (Last 24 hours) at 10/31/2021 0921 Last data filed at 10/30/2021 2000 Gross per 24 hour  Intake 120 ml  Output --  Net 120 ml   Filed Weights   10/28/21 1453  Weight: 59 kg    Exam: Awake Alert, Oriented *3, No new F.N deficits, Normal affect [place].AT,PERRAL Supple Neck,No JVD, No cervical lymphadenopathy appriciated.  Symmetrical Chest wall movement, Good air movement bilaterally, CTAB RRR,No Gallops,Rubs or new Murmurs, No Parasternal Heave +ve B.Sounds, Abd Soft, Non tender, No organomegaly appriciated, No rebound -guarding or rigidity. No Cyanosis, Clubbing or edema, No new Rash or bruise   PERTINENT RADIOLOGIC STUDIES: DG Chest Port 1  View  Result Date:  10/30/2021 CLINICAL DATA:  76 year old female with history of shortness of breath. EXAM: PORTABLE CHEST 1 VIEW COMPARISON:  Chest x-ray 10/28/2021. FINDINGS: Ill-defined opacity developing in the periphery of the left lung base partially obscuring the lateral left hemidiaphragm and the cardiac apex. Right lung appears clear. No pneumothorax. Blunting of the left costophrenic sulcus. No right pleural effusion. No evidence of pulmonary edema. Heart size is borderline enlarged. The patient is rotated to the left on today's exam, resulting in distortion of the mediastinal contours and reduced diagnostic sensitivity and specificity for mediastinal pathology. Orthopedic fixation hardware throughout the lower thoracic and lumbar spine. IMPRESSION: 1. Interval development of opacity in the left lung base which may reflect atelectasis and/or consolidation, likely with small left-sided pleural effusion. Electronically Signed   By: Vinnie Langton M.D.   On: 10/30/2021 06:27   VAS [place] LOWER EXTREMITY VENOUS (DVT)  Result Date: 10/30/2021  Lower Venous DVT Study Patient Name:  Mckenzie [name]  Date of Exam:   10/30/2021 Medical Rec #: [id number]          Accession #:    [id number] Date of Birth: Nov 09, 1945           Patient Gender: F Patient Age:   21 years Exam Location:  [facility] Procedure:      VAS [place] LOWER EXTREMITY VENOUS (DVT) Referring Phys: Oren Binet --------------------------------------------------------------------------------  Indications: COVID, elevated d-dimer.  Limitations: Poor ultrasound/tissue interface. Comparison Study: No prior study Performing Technologist: Maudry Mayhew MHA, RDMS, RVT, RDCS  Examination Guidelines: A complete evaluation includes B-mode imaging, spectral Doppler, color Doppler, and power Doppler as needed of all accessible portions of each vessel. Bilateral testing is considered an integral part of a complete examination. Limited examinations for reoccurring  indications may be performed as noted. The reflux portion of the exam is performed with the patient in reverse Trendelenburg.  +---------+---------------+---------+-----------+----------+--------------+  RIGHT     Compressibility Phasicity Spontaneity Properties Thrombus Aging  +---------+---------------+---------+-----------+----------+--------------+  CFV       Full            Yes       Yes                                    +---------+---------------+---------+-----------+----------+--------------+  SFJ       Full                                                             +---------+---------------+---------+-----------+----------+--------------+  FV Prox   Full                                                             +---------+---------------+---------+-----------+----------+--------------+  FV Mid    Full                                                             +---------+---------------+---------+-----------+----------+--------------+  FV Distal Full                                                             +---------+---------------+---------+-----------+----------+--------------+  PFV       Full                                                             +---------+---------------+---------+-----------+----------+--------------+  POP       Full            Yes       Yes                                    +---------+---------------+---------+-----------+----------+--------------+  PTV       Full                                                             +---------+---------------+---------+-----------+----------+--------------+  PERO      Full                                                             +---------+---------------+---------+-----------+----------+--------------+ Limited visualization PTV, peroneal veins  +---------+---------------+---------+-----------+----------+--------------+  LEFT      Compressibility Phasicity Spontaneity Properties Thrombus Aging   +---------+---------------+---------+-----------+----------+--------------+  CFV       Full            Yes       Yes                                    +---------+---------------+---------+-----------+----------+--------------+  SFJ       Full                                                             +---------+---------------+---------+-----------+----------+--------------+  FV Prox   Full                                                             +---------+---------------+---------+-----------+----------+--------------+  FV Mid    Full                                                             +---------+---------------+---------+-----------+----------+--------------+  FV Distal Full                                                             +---------+---------------+---------+-----------+----------+--------------+  PFV       Full                                                             +---------+---------------+---------+-----------+----------+--------------+  POP       Full            Yes       Yes                                    +---------+---------------+---------+-----------+----------+--------------+  PTV       Full                                                             +---------+---------------+---------+-----------+----------+--------------+  PERO      Full                                                             +---------+---------------+---------+-----------+----------+--------------+ Limited visualization PTV, peroneal veins    Summary: RIGHT: - There is no evidence of deep vein thrombosis in the lower extremity.  - No cystic structure found in the popliteal fossa.  LEFT: - There is no evidence of deep vein thrombosis in the lower extremity.  - No cystic structure found in the popliteal fossa.  *See table(s) above for measurements and observations. Electronically signed by Deitra Mayo MD on 10/30/2021 at 9:06:13 PM.    Final      PERTINENT LAB RESULTS: CBC: Recent  Labs    10/29/21 0345 10/30/21 0249  WBC 6.6 5.6  HGB 10.9* 10.9*  HCT 33.4* 32.8*  PLT 351 351   CMET CMP     Component Value Date/Time   NA 132 (L) 10/30/2021 0249   NA 143 09/11/2021 1208   K 3.6 10/30/2021 0249   CL 98 10/30/2021 0249   CO2 24 10/30/2021 0249   GLUCOSE 87 10/30/2021 0249   BUN 12 10/30/2021 0249   BUN 10 09/11/2021 1208   CREATININE 0.50 10/30/2021 0249   CREATININE 0.66 04/25/2021 1053   CALCIUM 8.4 (L) 10/30/2021 0249   PROT 5.9 (L) 10/29/2021 0345   PROT 6.9 01/18/2021 1219   ALBUMIN 2.4 (L) 10/29/2021 0345   ALBUMIN 3.8 01/18/2021 1219   AST 25 10/29/2021 0345   ALT 13 10/29/2021 0345   ALKPHOS 100 10/29/2021 0345   BILITOT 0.7 10/29/2021 0345   BILITOT 0.3 01/18/2021 1219   GFRNONAA >60 10/30/2021 0249   GFRNONAA 90 01/23/2018 0803  GFRAA 120 12/01/2020 1127   GFRAA 104 01/23/2018 0803    GFR Estimated Creatinine Clearance: 53.8 mL/min (by C-G formula based on SCr of 0.5 mg/dL). No results for input(s): LIPASE, AMYLASE in the last 72 hours. No results for input(s): CKTOTAL, CKMB, CKMBINDEX, TROPONINI in the last 72 hours. Invalid input(s): POCBNP Recent Labs    10/29/21 0345 10/30/21 0726  DDIMER 1.87* 1.57*   No results for input(s): HGBA1C in the last 72 hours. No results for input(s): CHOL, HDL, LDLCALC, TRIG, CHOLHDL, LDLDIRECT in the last 72 hours. No results for input(s): TSH, T4TOTAL, T3FREE, THYROIDAB in the last 72 hours.  Invalid input(s): FREET3 Recent Labs    10/29/21 0345  FERRITIN 128   Coags: No results for input(s): INR in the last 72 hours.  Invalid input(s): PT Microbiology: Recent Results (from the past 240 hour(s))  Resp Panel by RT-PCR (Flu A&B, Covid) Nasopharyngeal Swab     Status: Abnormal   Collection Time: 10/28/21  3:09 PM   Specimen: Nasopharyngeal Swab; Nasopharyngeal(NP) swabs in vial transport medium  Result Value Ref Range Status   SARS Coronavirus 2 by RT PCR POSITIVE (A) NEGATIVE Final     Comment: (NOTE) SARS-CoV-2 target nucleic acids are DETECTED.  The SARS-CoV-2 RNA is generally detectable in upper respiratory specimens during the acute phase of infection. Positive results are indicative of the presence of the identified virus, but do not rule out bacterial infection or co-infection with other pathogens not detected by the test. Clinical correlation with patient history and other diagnostic information is necessary to determine patient infection status. The expected result is Negative.  Fact Sheet for Patients: EntrepreneurPulse.com.au  Fact Sheet for Healthcare Providers: IncredibleEmployment.be  This test is not yet approved or cleared by the [place] FDA and  has been authorized for detection and/or diagnosis of SARS-CoV-2 by FDA under an Emergency Use Authorization (EUA).  This EUA will remain in effect (meaning this test can be used) for the duration of  the COVID-19 declaration under Section 564(b)(1) of the A ct, 21 U.S.C. section 360bbb-3(b)(1), unless the authorization is terminated or revoked sooner.     Influenza A by PCR NEGATIVE NEGATIVE Final   Influenza B by PCR NEGATIVE NEGATIVE Final    Comment: (NOTE) The Xpert Xpress SARS-CoV-2/FLU/RSV plus assay is intended as an aid in the diagnosis of influenza from Nasopharyngeal swab specimens and should not be used as a sole basis for treatment. Nasal washings and aspirates are unacceptable for Xpert Xpress SARS-CoV-2/FLU/RSV testing.  Fact Sheet for Patients: EntrepreneurPulse.com.au  Fact Sheet for Healthcare Providers: IncredibleEmployment.be  This test is not yet approved or cleared by the [place] FDA and has been authorized for detection and/or diagnosis of SARS-CoV-2 by FDA under an Emergency Use Authorization (EUA). This EUA will remain in effect (meaning this test can be used) for the duration of  the COVID-19 declaration under Section 564(b)(1) of the Act, 21 U.S.C. section 360bbb-3(b)(1), unless the authorization is terminated or revoked.  Performed at [facility], [place] [address] [place], [place] [address]   Urine Culture     Status: Abnormal   Collection Time: 10/28/21  7:36 PM   Specimen: Urine, Catheterized  Result Value Ref Range Status   Specimen Description URINE, CATHETERIZED  Final   Special Requests   Final    NONE Performed at [facility], [place] [address] [place], [place] [address]    Culture >=100,000 COLONIES/mL PSEUDOMONAS AERUGINOSA (A)  Final  Report Status 10/31/2021 FINAL  Final   Organism ID, Bacteria PSEUDOMONAS AERUGINOSA (A)  Final      Susceptibility   Pseudomonas aeruginosa - MIC*    CEFTAZIDIME 2 SENSITIVE Sensitive     CIPROFLOXACIN <=0.25 SENSITIVE Sensitive     GENTAMICIN 4 SENSITIVE Sensitive     IMIPENEM 1 SENSITIVE Sensitive     PIP/TAZO <=4 SENSITIVE Sensitive     * >=100,000 COLONIES/mL PSEUDOMONAS AERUGINOSA    FURTHER DISCHARGE INSTRUCTIONS:  Get Medicines reviewed and adjusted: Please take all your medications with you for your next visit with your Primary MD  Laboratory/radiological data: Please request your Primary MD to go over all hospital tests and procedure/radiological results at the follow up, please ask your Primary MD to get all Hospital records sent to his/her office.  In some cases, they will be blood work, cultures and biopsy results pending at the time of your discharge. Please request that your primary care M.D. goes through all the records of your hospital data and follows up on these results.  Also Note the following: If you experience worsening of your admission symptoms, develop shortness of breath, life threatening emergency, suicidal or homicidal thoughts you must seek medical attention immediately by calling 911 or calling your MD immediately  if symptoms less severe.  You must read complete  instructions/literature along with all the possible adverse reactions/side effects for all the Medicines you take and that have been prescribed to you. Take any new Medicines after you have completely understood and accpet all the possible adverse reactions/side effects.   Do not drive when taking Pain medications or sleeping medications (Benzodaizepines)  Do not take more than prescribed Pain, Sleep and Anxiety Medications. It is not advisable to combine anxiety,sleep and pain medications without talking with your primary care practitioner  Special Instructions: If you have smoked or chewed Tobacco  in the last 2 yrs please stop smoking, stop any regular Alcohol  and or any Recreational drug use.  Wear Seat belts while driving.  Please note: You were cared for by a hospitalist during your hospital stay. Once you are discharged, your primary care physician will handle any further medical issues. Please note that NO REFILLS for any discharge medications will be authorized once you are discharged, as it is imperative that you return to your primary care physician (or establish a relationship with a primary care physician if you do not have one) for your post hospital discharge needs so that they can reassess your need for medications and monitor your lab values.  Total Time spent coordinating discharge including counseling, education and face to face time equals 35 minutes.  SignedOren Binet 10/31/2021 9:21 AM

## 2021-10-30 NOTE — Progress Notes (Signed)
PROGRESS NOTE        PATIENT DETAILS Name: Mckenzie [name] Age: 76 y.o. 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  See below for further details.    Significant events: 1/22>> admit for hypoxia-COVID-19 infection.  Microbiology: 1/22: Urine culture: Pending  Subjective: Doing well-complains of some sore throat and cough but breathing okay.  Appears comfortable.  When I was in the room-she was on room air.  Objective: Vitals: Blood pressure 113/63, pulse 73, temperature 98.1 F (36.7 C), temperature source Oral, resp. rate 14, height 5\' 5"  (1.651 m), weight 59 kg, SpO2 96 %.   Exam: Gen Exam:Alert awake-not in any distress HEENT:atraumatic, normocephalic Chest: B/L clear to auscultation anteriorly CVS:S1S2 regular Abdomen:soft non tender, non distended Extremities:no edema Neurology: Non focal Skin: no rash   Pertinent Labs/Radiology: CBC Latest Ref Rng & Units 10/30/2021 10/29/2021 10/28/2021  WBC 4.0 - 10.5 K/uL 5.6 6.6 8.2  Hemoglobin 12.0 - 15.0 g/dL 10.9(L) 10.9(L) 11.2(L)  Hematocrit 36.0 - 46.0 % 32.8(L) 33.4(L) 36.3  Platelets 150 - 400 K/uL 351 351 438(H)    Lab Results  Component Value Date   NA 132 (L) 10/30/2021   K 3.6 10/30/2021   CL 98 10/30/2021   CO2 24 10/30/2021      Assessment/Plan: * COVID-19 viral infection with hypoxia- (present on admission) Minimal hypoxia-mostly on room air this morning.  CXR with ?  LLL atelectasis/pneumonia this morning-but patient is overall clinically improved.  Continue   Molnupiravir.   D-dimer slightly elevated-doubt VTE but given sedentary lifestyle-we will obtain lower extremity Doppler.  If this is negative-doubt further work-up is required.  She is at risk for severe disease given immunocompromise status (chronic steroid use for approximately a year)     Generalized weakness Has chronic debility since her recent thoracic spine surgery in November 2022-now much more deconditioned than usual due to COVID-19 infection.  PT/OT eval appreciated-plans are for home health on discharge.  Hypokalemia Repleted.  Hyperlipidemia- (present on admission) No longer on statin-defer further to outpatient setting.  Essential hypertension- (present on admission) BP stable-continue amlodipine, lisinopril.  History of CVA (cerebrovascular accident) Generalized weakness but nonfocal exam-Per prior notes-no longer on aspirin or statin.    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[place])- (present on admission) On chronic prednisone 5 mg daily, continue.  Adjustment disorder with mixed anxiety and depressed mood- (present on admission) Continue Lexapro.  Foley catheter in place prior to arrival- (present on admission) Apparently has a history of nocturia/stress incontinence-has seen urology in the past.  Has a Foley catheter since November 2022 that was left in place after thoracic spine surgery.  Per family-this was left in because of significant nocturia.  Patient already had an episode of UTI-and was on Keflex prior to this hospitalization.  Long discussion with family on 1/23-different options discussed-option of leaving catheter versus removing catheter and using purewick at night (at home) discussed in detail.  Family elected to remove catheter-she is voiding spontaneously.  However external catheter/purewick  not covered by patient's insurance-family will use some sort of diaper at night-and will ensure that the patient voids at bedside, during the daytime.  Although patient's urine  culture drawn on admission is positive for Pseudomonas-it is highly likely that this is a colonization.  Extensive discussion with spouse at [place] though he understands this is a colonization-is requesting treatment-we will give 1 dose of fosfomycin.  I have urged him to follow-up with patient's urologist post discharge.      Compression fracture of T12 vertebra ([place])- (present on admission) Underwent spine surgery in November 2022.  Apparently has had a Foley catheter since then-due to worsening nocturia.     BMI: Estimated body mass index is 21.64 kg/m as calculated from the following:   Height as of this encounter: 5\' 5"  (1.651 m).   Weight as of this encounter: 59 kg.    DVT Prophylaxis: SQ Lovenox Procedures: None Consults: None Code Status:DNI Family Communication: Spouse at bedside (daughter was on speaker phone)   Disposition Plan: Status is: Observation  The patient will require care spanning > 2 midnights and should be moved to inpatient because: Severe debility-COVID-19 infection with mild hypoxemia-not yet stable for discharge-needs to be ambulated with therapy services to determine appropriate disposition given his significant fall risk.  Immunocompromised and risk for severe disease.    Diet: Diet Order             Diet Heart Room service appropriate? Yes; Fluid consistency: Thin  Diet effective now                     Antimicrobial agents: Anti-infectives (From admission, onward)    Start     Dose/Rate Route Frequency Ordered Stop   10/30/21 1500  fosfomycin (MONUROL) packet 3 g        3 g Oral  Once 10/30/21 1409     10/28/21 2200  molnupiravir EUA (LAGEVRIO) capsule 800 mg        4 capsule Oral 2 times daily 10/28/21 2049 11/02/21 2159        MEDICATIONS: Scheduled Meds:  amLODipine  5 mg Oral Daily   dorzolamide  1 drop Left Eye BID   enoxaparin (LOVENOX) injection  40 mg Subcutaneous Q24H   escitalopram  20 mg Oral Daily    fluorometholone  1 drop Right Eye BID   fosfomycin  3 g Oral Once   latanoprost  1 drop Both Eyes QHS   lisinopril  10 mg Oral Daily   magnesium oxide  400 mg Oral Daily   molnupiravir EUA  4 capsule Oral BID   predniSONE  5 mg Oral Q breakfast   Continuous Infusions: PRN Meds:.acetaminophen, guaiFENesin-dextromethorphan, ondansetron **OR** ondansetron (ZOFRAN) IV, senna-docusate   I have personally reviewed following labs and imaging studies  LABORATORY DATA: CBC: Recent Labs  Lab 10/28/21 1730 10/29/21 0345 10/30/21 0249  WBC 8.2 6.6 5.6  NEUTROABS 6.8 4.9  --   HGB 11.2* 10.9* 10.9*  HCT 36.3 33.4* 32.8*  MCV 89.0 87.0 85.4  PLT 438* 351 299    Basic Metabolic Panel: Recent Labs  Lab 10/28/21 1730 10/29/21 0345 10/30/21 0249  NA 134* 134* 132*  K 4.0 3.3* 3.6  CL 97* 103 98  CO2 27 24 24   GLUCOSE 124* 93 87  BUN 9 8 12   CREATININE 0.59 0.49 0.50  CALCIUM 9.5 8.0* 8.4*  MG  --  1.8 1.9  PHOS  --  3.3  --  GFR: Estimated Creatinine Clearance: 53.8 mL/min (by C-G formula based on SCr of 0.5 mg/dL).  Liver Function Tests: Recent Labs  Lab 10/29/21 0345  AST 25  ALT 13  ALKPHOS 100  BILITOT 0.7  PROT 5.9*  ALBUMIN 2.4*   No results for input(s): LIPASE, AMYLASE in the last 168 hours. No results for input(s): AMMONIA in the last 168 hours.  Coagulation Profile: No results for input(s): INR, PROTIME in the last 168 hours.  Cardiac Enzymes: No results for input(s): CKTOTAL, CKMB, CKMBINDEX, TROPONINI in the last 168 hours.  BNP (last 3 results) No results for input(s): PROBNP in the last 8760 hours.  Lipid Profile: No results for input(s): CHOL, HDL, LDLCALC, TRIG, CHOLHDL, LDLDIRECT in the last 72 hours.  Thyroid Function Tests: No results for input(s): TSH, T4TOTAL, FREET4, T3FREE, THYROIDAB in the last 72 hours.  Anemia Panel: Recent Labs    10/29/21 0345  FERRITIN 128    Urine analysis:    Component Value Date/Time    COLORURINE AMBER (A) 10/28/2021 1730   APPEARANCEUR CLOUDY (A) 10/28/2021 1730   APPEARANCEUR Clear 07/05/2021 1415   LABSPEC 1.016 10/28/2021 1730   PHURINE 7.0 10/28/2021 1730   GLUCOSEU NEGATIVE 10/28/2021 1730   HGBUR SMALL (A) 10/28/2021 1730   BILIRUBINUR NEGATIVE 10/28/2021 1730   BILIRUBINUR Negative 07/05/2021 1415   KETONESUR NEGATIVE 10/28/2021 1730   PROTEINUR 100 (A) 10/28/2021 1730   UROBILINOGEN negative 05/13/2015 0827   NITRITE NEGATIVE 10/28/2021 1730   LEUKOCYTESUR LARGE (A) 10/28/2021 1730    Sepsis Labs: Lactic Acid, Venous    Component Value Date/Time   LATICACIDVEN 1.5 07/20/2021 1936    MICROBIOLOGY: Recent Results (from the past 240 hour(s))  Resp Panel by RT-PCR (Flu A&B, Covid) Nasopharyngeal Swab     Status: Abnormal   Collection Time: 10/28/21  3:09 PM   Specimen: Nasopharyngeal Swab; Nasopharyngeal(NP) swabs in vial transport medium  Result Value Ref Range Status   SARS Coronavirus 2 by RT PCR POSITIVE (A) NEGATIVE Final    Comment: (NOTE) SARS-CoV-2 target nucleic acids are DETECTED.  The SARS-CoV-2 RNA is generally detectable in upper respiratory specimens during the acute phase of infection. Positive results are indicative of the presence of the identified virus, but do not rule out bacterial infection or co-infection with other pathogens not detected by the test. Clinical correlation with patient history and other diagnostic information is necessary to determine patient infection status. The expected result is Negative.  Fact Sheet for Patients: EntrepreneurPulse.com.au  Fact Sheet for Healthcare Providers: IncredibleEmployment.be  This test is not yet approved or cleared by the [place] FDA and  has been authorized for detection and/or diagnosis of SARS-CoV-2 by FDA under an Emergency Use Authorization (EUA).  This EUA will remain in effect (meaning this test can be used) for the duration of   the COVID-19 declaration under Section 564(b)(1) of the A ct, 21 U.S.C. section 360bbb-3(b)(1), unless the authorization is terminated or revoked sooner.     Influenza A by PCR NEGATIVE NEGATIVE Final   Influenza B by PCR NEGATIVE NEGATIVE Final    Comment: (NOTE) The Xpert Xpress SARS-CoV-2/FLU/RSV plus assay is intended as an aid in the diagnosis of influenza from Nasopharyngeal swab specimens and should not be used as a sole basis for treatment. Nasal washings and aspirates are unacceptable for Xpert Xpress SARS-CoV-2/FLU/RSV testing.  Fact Sheet for Patients: EntrepreneurPulse.com.au  Fact Sheet for Healthcare Providers: IncredibleEmployment.be  This test is not yet approved or cleared by the  [place] Architectural technologist and has been authorized for detection and/or diagnosis of SARS-CoV-2 by FDA under an Print production planner (EUA). This EUA will remain in effect (meaning this test can be used) for the duration of the COVID-19 declaration under Section 564(b)(1) of the Act, 21 U.S.C. section 360bbb-3(b)(1), unless the authorization is terminated or revoked.  Performed at [facility], [place] [address], [place], [place] [address]   Urine Culture     Status: Abnormal (Preliminary result)   Collection Time: 10/28/21  7:36 PM   Specimen: Urine, Catheterized  Result Value Ref Range Status   Specimen Description URINE, CATHETERIZED  Final   Special Requests NONE  Final   Culture (A)  Final    >=100,000 COLONIES/mL PSEUDOMONAS AERUGINOSA SUSCEPTIBILITIES TO FOLLOW Performed at [facility], [place] [address], [place], [place] [address]    Report Status PENDING  Incomplete    RADIOLOGY STUDIES/RESULTS: DG Chest Port 1 View  Result Date: 10/30/2021 CLINICAL DATA:  76 year old female with history of shortness of breath. EXAM: PORTABLE CHEST 1 VIEW COMPARISON:  Chest x-ray 10/28/2021. FINDINGS: Ill-defined opacity developing in the  periphery of the left lung base partially obscuring the lateral left hemidiaphragm and the cardiac apex. Right lung appears clear. No pneumothorax. Blunting of the left costophrenic sulcus. No right pleural effusion. No evidence of pulmonary edema. Heart size is borderline enlarged. The patient is rotated to the left on today's exam, resulting in distortion of the mediastinal contours and reduced diagnostic sensitivity and specificity for mediastinal pathology. Orthopedic fixation hardware throughout the lower thoracic and lumbar spine. IMPRESSION: 1. Interval development of opacity in the left lung base which may reflect atelectasis and/or consolidation, likely with small left-sided pleural effusion. Electronically Signed   By: Vinnie Langton M.D.   On: 10/30/2021 06:27   DG Chest Port 1 View  Result Date: 10/28/2021 CLINICAL DATA:  Shortness of breath EXAM: PORTABLE CHEST 1 VIEW COMPARISON:  07/20/2021 FINDINGS: Borderline to mild cardiomegaly. Atelectasis or scar at the left base. No consolidation, pleural effusion, or pneumothorax. Spinal rods and fixating screws at the thoracolumbar spine. IMPRESSION: No active disease. Cardiomegaly with mild scarring or atelectasis at the left base. Electronically Signed   By: Donavan Foil M.D.   On: 10/28/2021 15:34     LOS: 1 day   Oren Binet, MD  [facility]    To contact the attending provider between 7A-7P or the covering provider during after hours 7P-7A, please log into the web site [email or web address] and access using universal [place] password for that web site. If you do not have the password, please call the hospital operator.  10/30/2021, 2:23 PM

## 2021-10-30 NOTE — TOC Progression Note (Signed)
Transition of Care [facility]) - Progression Note    Patient Details  Name: Mckenzie [name] MRN: [id number] Date of Birth: 1945/12/14  [facility] CM/SW Contact  Cyndi Bender, RN Phone Number: 10/30/2021, 12:35 PM  Clinical Narrative:    Orders for Home Health-RN/PT/OT. Spoke to husband and he states patient is active with [facility] and would like to continue to use them. Spoke to [place] with [facility] and referral accepted. Husband will transport home once medically ready   Expected Discharge Plan: [place] Barriers to Discharge: Barriers Resolved  Expected Discharge Plan and Services Expected Discharge Plan: [place]   Discharge Planning Services: CM Consult Post Acute Care Choice: [place] arrangements for the past 2 months: Single Family Home                           HH Arranged: RN, PT, OT [facility] Agency: [place] Date [place]: 10/30/21 Time [place]: 1324 Representative spoke with at [place]: [place] ([place]) Interventions    Readmission Risk Interventions No flowsheet data found.

## 2021-10-31 LAB — URINE CULTURE: Culture: >=100000 — AB

## 2021-10-31 NOTE — TOC Transition Note (Signed)
Transition of Care [facility]) - CM/SW Discharge Note   Patient Details  Name: Mckenzie [name] MRN: [id number] Date of Birth: Sep 15, 1946  Transition of Care [facility]) CM/SW Contact:  Carles Collet, RN Phone Number: 10/31/2021, 10:17 AM   Clinical Narrative:    Plan to DC to home today.  Notified [facility] liaison of DC.  Discussed O2 w attending, who states he saw her this morning and she was on room air; no home oxygen needed. No other TOC needs identified. DC to home via private car with husband.     Final next level of care: [place] Barriers to Discharge: No Barriers Identified   Patient Goals and CMS Choice Patient states their goals for this hospitalization and ongoing recovery are:: return home CMS Medicare.gov Compare Post Acute Care list provided to:: Patient Choice offered to / list presented to : Spouse  Discharge Placement                       Discharge Plan and Services   Discharge Planning Services: CM Consult Post Acute Care Choice: Home Health                    HH Arranged: RN, PT, OT [facility] Agency: [place] Date [place]: 10/31/21 Time [place]: [place] Representative spoke with at [place]: [place] Determinants of Health ([place]) Interventions     Readmission Risk Interventions No flowsheet data found.

## 2021-10-31 NOTE — Plan of Care (Signed)
°  Problem: Education: Goal: Knowledge of General Education information will improve Description: Including pain rating scale, medication(s)/side effects and non-pharmacologic comfort measures 10/31/2021 1110 by Iverson Alamin, RN Outcome: Progressing 10/31/2021 1107 by Iverson Alamin, RN Outcome: Progressing   Problem: Health Behavior/Discharge Planning: Goal: Ability to manage health-related needs will improve 10/31/2021 1110 by Iverson Alamin, RN Outcome: Progressing 10/31/2021 1107 by Iverson Alamin, RN Outcome: Progressing   Problem: Clinical Measurements: Goal: Ability to maintain clinical measurements within normal limits will improve 10/31/2021 1110 by Iverson Alamin, RN Outcome: Progressing 10/31/2021 1107 by Iverson Alamin, RN Outcome: Progressing Goal: Will remain free from infection 10/31/2021 1110 by Iverson Alamin, RN Outcome: Progressing 10/31/2021 1107 by Iverson Alamin, RN Outcome: Progressing Goal: Diagnostic test results will improve 10/31/2021 1110 by Iverson Alamin, RN Outcome: Progressing 10/31/2021 1107 by Iverson Alamin, RN Outcome: Progressing Goal: Respiratory complications will improve 10/31/2021 1110 by Iverson Alamin, RN Outcome: Progressing 10/31/2021 1107 by Iverson Alamin, RN Outcome: Progressing Goal: Cardiovascular complication will be avoided 10/31/2021 1110 by Iverson Alamin, RN Outcome: Progressing 10/31/2021 1107 by Iverson Alamin, RN Outcome: Progressing   Problem: Activity: Goal: Risk for activity intolerance will decrease 10/31/2021 1110 by Iverson Alamin, RN Outcome: Progressing 10/31/2021 1107 by Iverson Alamin, RN Outcome: Progressing   Problem: Nutrition: Goal: Adequate nutrition will be maintained 10/31/2021 1110 by Iverson Alamin, RN Outcome: Progressing 10/31/2021 1107 by Iverson Alamin, RN Outcome: Progressing   Problem: Coping: Goal: Level of anxiety will decrease 10/31/2021 1110 by Iverson Alamin, RN Outcome:  Progressing 10/31/2021 1107 by Iverson Alamin, RN Outcome: Progressing   Problem: Elimination: Goal: Will not experience complications related to bowel motility 10/31/2021 1110 by Iverson Alamin, RN Outcome: Progressing 10/31/2021 1107 by Iverson Alamin, RN Outcome: Progressing Goal: Will not experience complications related to urinary retention 10/31/2021 1110 by Iverson Alamin, RN Outcome: Progressing 10/31/2021 1107 by Iverson Alamin, RN Outcome: Progressing   Problem: Pain Managment: Goal: General experience of comfort will improve 10/31/2021 1110 by Iverson Alamin, RN Outcome: Progressing 10/31/2021 1107 by Iverson Alamin, RN Outcome: Progressing   Problem: Safety: Goal: Ability to remain free from injury will improve 10/31/2021 1110 by Iverson Alamin, RN Outcome: Progressing 10/31/2021 1107 by Iverson Alamin, RN Outcome: Progressing   Problem: Skin Integrity: Goal: Risk for impaired skin integrity will decrease 10/31/2021 1110 by Iverson Alamin, RN Outcome: Progressing 10/31/2021 1107 by Iverson Alamin, RN Outcome: Progressing

## 2021-10-31 NOTE — Plan of Care (Signed)

## 2021-10-31 NOTE — Progress Notes (Signed)
PT Cancellation Note  Patient Details Name: Mckenzie [name] MRN: [id number] DOB: 12/10/1945   Cancelled Treatment:    Reason Eval/Treat Not Completed: Patient declined, no reason specified. Pt declines mobility at this time, reporting she is waiting to have her wet sheets changed. PT offers to assist with this and help the patient get out of the wet bed however the pt continues to decline. PT will follow up as time allows.   Zenaida Niece 10/31/2021, 9:30 AM

## 2021-10-31 NOTE — Progress Notes (Signed)
Discharged by ADT RN

## 2021-10-31 NOTE — Progress Notes (Signed)
DC note  Piv dcd. Site unremarkable. Dc instructions reviewed with patient and daughter becky. Both verbalized understanding of dc instructions. All belongings given to patient including dc papers. Daughter is taking patient home via car.

## 2021-11-01 ENCOUNTER — Telehealth: Payer: Self-pay

## 2021-11-01 NOTE — Telephone Encounter (Signed)
Transition Care Management Follow-up Telephone Call Date of discharge and from where: [facility] 10-31-21 Dx: COVID/hypoxia How have you been since you were released from the hospital? Doing pretty good  Any questions or concerns? No  Items Reviewed: Did the pt receive and understand the discharge instructions provided? Yes  Medications obtained and verified? Yes  Other? No  Any new allergies since your discharge? No  Dietary orders reviewed? Yes Do you have support at home? Yes   Home Care and Equipment/Supplies: Were home health services ordered? yes If so, what is the name of the agency? [facility]  Has the agency set up a time to come to the patient's home? no Were any new equipment or medical supplies ordered?  No What is the name of the medical supply agency? [facility] Were you able to get the supplies/equipment? no Do you have any questions related to the use of the equipment or supplies? No  Functional Questionnaire: (I = Independent and D = Dependent) ADLs: I  Bathing/Dressing- D  Meal Prep- D  Eating- I  Maintaining continence- I  Transferring/Ambulation- D  Managing Meds- D  Follow up appointments reviewed:  PCP Hospital f/u appt confirmed? No Dtr states patients spouse will call back to make a tele visit call- explained to them that it needed to be next week if possible . [facility] f/u appt confirmed? No . Are transportation arrangements needed? No  If their condition worsens, is the pt aware to call PCP or go to the Emergency Dept.? Yes Was the patient provided with contact information for the PCP's office or ED? Yes Was to pt encouraged to call back with questions or concerns? Yes

## 2021-11-15 ENCOUNTER — Telehealth: Payer: Self-pay | Admitting: Family Medicine

## 2021-11-15 NOTE — Telephone Encounter (Signed)
Patient's husband declined the Medicare Wellness Visit with [facility]  -stated she does not need to complete since she sees and talks to so many providers

## 2021-11-21 ENCOUNTER — Telehealth: Payer: Self-pay

## 2021-11-21 NOTE — Telephone Encounter (Signed)
Occupational therapy had been ordered for this patient but they are unable to complete because they do not have an occupational therapist.

## 2021-11-21 NOTE — Telephone Encounter (Signed)
It looks like she never had a hospital follow-up. If we need to re-order for it to go to a different agency she will need a face-to-face visit.

## 2021-11-22 NOTE — Telephone Encounter (Signed)
Husband aware and verbalizes understanding per dpr.

## 2021-11-26 ENCOUNTER — Telehealth: Payer: Self-pay | Admitting: Neurology

## 2021-11-26 ENCOUNTER — Encounter: Payer: Self-pay | Admitting: Family Medicine

## 2021-11-26 NOTE — Telephone Encounter (Signed)
Pt's daughter, Ravin Bendall (on [place]) physical and occupational therapist indicated they think she has Lewy Dementia. Can he do another brain that can indicate dementia.? Would like a call from the nurse.

## 2021-11-26 NOTE — Telephone Encounter (Signed)
Called the pt's daughter back. There was no answer. LVM advising the daughter that it doesn't appear that we have treated her or seen her previously for memory related concerns or evaluation. Advised that the patient work up through primary care first and then we can address/ have her follow up with Dr. Felecia Shelling for the new concern.   If the patient calls back please advise that having the work up through primary care first is beneficial so that we have information for the initial evaluation when we complete our first visit or this concern.

## 2021-11-29 ENCOUNTER — Encounter: Payer: Self-pay | Admitting: Family Medicine

## 2021-11-29 ENCOUNTER — Ambulatory Visit (INDEPENDENT_AMBULATORY_CARE_PROVIDER_SITE_OTHER): Payer: Medicare Other | Admitting: Family Medicine

## 2021-11-29 VITALS — BP 128/64 | HR 82 | Temp 96.8°F | Ht 65.0 in | Wt 118.0 lb

## 2021-11-29 DIAGNOSIS — R7309 Other abnormal glucose: Secondary | ICD-10-CM

## 2021-11-29 DIAGNOSIS — R4189 Other symptoms and signs involving cognitive functions and awareness: Secondary | ICD-10-CM

## 2021-11-29 DIAGNOSIS — F03911 Unspecified dementia, unspecified severity, with agitation: Secondary | ICD-10-CM | POA: Diagnosis not present

## 2021-11-29 DIAGNOSIS — E782 Mixed hyperlipidemia: Secondary | ICD-10-CM

## 2021-11-29 DIAGNOSIS — I10 Essential (primary) hypertension: Secondary | ICD-10-CM | POA: Diagnosis not present

## 2021-11-29 LAB — URINALYSIS, ROUTINE W REFLEX MICROSCOPIC
Bilirubin, UA: NEGATIVE
Glucose, UA: NEGATIVE
Ketones, UA: NEGATIVE
Nitrite, UA: POSITIVE — AB
Specific Gravity, UA: 1.025 (ref 1.005–1.030)
Urobilinogen, Ur: 0.2 mg/dL (ref 0.2–1.0)
pH, UA: 6 (ref 5.0–7.5)

## 2021-11-29 LAB — MICROSCOPIC EXAMINATION: WBC, UA: >30 /hpf — AB (ref 0–5)

## 2021-11-29 LAB — BAYER DCA HB A1C WAIVED: HB A1C (BAYER DCA - WAIVED): 7.4 % — ABNORMAL HIGH (ref 4.8–5.6)

## 2021-11-29 MED ORDER — LORAZEPAM 0.5 MG PO TABS: 0.5000 mg | ORAL_TABLET | Freq: Two times a day (BID) | ORAL | 2 refills | Status: DC | PRN

## 2021-11-29 NOTE — Progress Notes (Signed)
Assessment & Plan:  1. Cognitive impairment Patient definitely has some cognitive impairment. I would like neurology to assess further. I have reached out to Dr. Felecia Shelling to ask for his assistance in this matter.  - CBC with Differential/Platelet - CMP14+EGFR - TSH - Vitamin B12 - RPR - Urinalysis, Routine w reflex microscopic - Microscopic Examination  2. 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 If patient/family find this helpful and want to continue it, a controlled substance agreement and urine drug screen will be needed. PDMP reviewed today with no concerning findings.  - LORazepam (ATIVAN) 0.5 MG tablet; Take 1 tablet (0.5 mg total) by mouth 2 (two) times daily as needed for anxiety.  Dispense: 30 tablet; Refill: 2  3. Elevated glucose - Bayer DCA Hb A1c Waived  4. Essential hypertension Well controlled on current regimen.  - CBC with Differential/Platelet - CMP14+EGFR - Lipid panel  5. Mixed hyperlipidemia Labs to assess. - Lipid panel   Hendricks Limes, MSN, APRN, FNP-C [facility]  Subjective:    [name], female    DOB: [birth year], 76 y.o.   MRN: [id number]  Patient Care Team: Loman Brooklyn, FNP as PCP - General (Family Medicine) Eloise Harman, DO as Consulting Physician (Internal Medicine) Wyatt Portela, MD as Consulting Physician (Oncology)   Chief Complaint:  Chief Complaint  Patient presents with   Referral    Neuro    HPI: Mckenzie [name] is a 76 y.o. female presenting on 11/29/2021 for Referral (Neuro)  Patient has an upcoming appointment with neurologist, Dr. Felecia Shelling. Her daughter reached out to me on MyChart with concerns about Lewy Body Dementia and asked for an evaluation so that if dementia was confirmed, neurology could assess next month. Patient does have periods of confusion, hallucinations, and becomes argumentative. Some days are better than others. She does have a history of UTIs,  but these are not always present.   MMSE - Mini Mental State Exam 11/29/2021  Orientation to time 3  Orientation to Place 5  Registration 3  Attention/ Calculation 5  Recall 1  Language- name 2 objects 2  Language- repeat 1  Language- follow 3 step command 3  Language- read & follow direction 1  Write a sentence 1  Copy design 0  Total score 25    New complaints: None   Social history:  Relevant past medical, surgical, family and social history reviewed and updated as indicated. Interim medical history since our last visit reviewed.  Allergies and medications reviewed and updated.  DATA REVIEWED: CHART IN EPIC  ROS: Negative unless specifically indicated above in HPI.    Current Outpatient Medications:    acetaminophen (TYLENOL) 325 MG tablet, Take 2 tablets (650 mg total) by mouth every 6 (six) hours as needed for mild pain (or Fever >/= 101)., Disp: , Rfl:    amLODipine (NORVASC) 5 MG tablet, Take 1 tablet (5 mg total) by mouth daily., Disp: 90 tablet, Rfl: 1   aspirin EC 81 MG EC tablet, Take 1 tablet (81 mg total) by mouth daily. X one month, then stop, Disp: 30 tablet, Rfl: 0   Bromfenac Sodium 0.07 % SOLN, Place 1 drop into the right eye every evening., Disp: , Rfl:    Calcium Carb-Cholecalciferol (CALCIUM+D3 PO), Take 2 tablets by mouth in the morning., Disp: , Rfl:    COMBIGAN 0.2-0.5 % ophthalmic solution, Place 1 drop into both eyes every 12 (twelve) hours., Disp: , Rfl:  dorzolamide (TRUSOPT) 2 % ophthalmic solution, Place 1 drop into the left eye 2 (two) times daily., Disp: , Rfl:    Ergocalciferol (VITAMIN D2) 50 MCG (2000 UT) TABS, Take 1 tablet by mouth daily., Disp: 30 tablet, Rfl: 6   escitalopram (LEXAPRO) 20 MG tablet, Take 1 tablet (20 mg total) by mouth daily., Disp: 90 tablet, Rfl: 1   fluorometholone (FML) 0.1 % ophthalmic suspension, Place 1 drop into the right eye 2 (two) times daily., Disp: , Rfl:    latanoprost (XALATAN) 0.005 % ophthalmic  solution, Place 1 drop into both eyes at bedtime., Disp: , Rfl:    lisinopril (ZESTRIL) 10 MG tablet, Take 1 tablet (10 mg total) by mouth daily., Disp: 30 tablet, Rfl: 2   magnesium oxide (MAG-OX) 400 MG tablet, Take 400 mg by mouth in the morning., Disp: , Rfl:    Multiple Vitamin (MULTIVITAMIN WITH MINERALS) TABS tablet, Take 1 tablet by mouth in the morning. One A Day Multivitamin, Disp: , Rfl:    PREDNISONE PO, Take 1 tablet by mouth daily., Disp: , Rfl:    Allergies  Allergen Reactions   Aldomet [Methyldopa] Other (See Comments)    Flu-like symptoms   Fosamax [Alendronate Sodium] Other (See Comments)    Weakness/fatigue    Plavix [Clopidogrel] Other (See Comments)    Arm swelled, bleeding   Acetazolamide Rash   Buspar [Buspirone] Palpitations   Past Medical History:  Diagnosis Date   Anemia    low iron   Arthritis    Depression    High cholesterol    Hypertension    Retinal micro-aneurysm of right eye    Stroke ([place])    2 or 3 mini strokes    Past Surgical History:  Procedure Laterality Date   ARTERY BIOPSY Bilateral 12/15/2020   Procedure: BILATERAL TEMPORAL ARTERY BIOPSIES;  Surgeon: Angelia Mould, MD;  Location: [place];  Service: Vascular;  Laterality: Bilateral;   COLONOSCOPY WITH PROPOFOL N/A 12/09/2020   Procedure: COLONOSCOPY WITH PROPOFOL;  Surgeon: Gatha Mayer, MD;  Location[facility] ENDOSCOPY;  Service: Endoscopy;  Laterality: N/A;   ESOPHAGOGASTRODUODENOSCOPY (EGD) WITH PROPOFOL N/A 12/09/2020   Procedure: ESOPHAGOGASTRODUODENOSCOPY (EGD) WITH PROPOFOL;  Surgeon: Gatha Mayer, MD;  Location: [place];  Service: Endoscopy;  Laterality: N/A;   EYE SURGERY     FINGER SURGERY Left    RING FINGER   IR THORACENTESIS ASP PLEURAL SPACE W/IMG GUIDE  12/13/2020   OPEN REDUCTION INTERNAL FIXATION (ORIF) DISTAL RADIAL FRACTURE Left 10/05/2020   Procedure: OPEN REDUCTION INTERNAL FIXATION (ORIF) DISTAL RADIAL FRACTURE;  Surgeon: Shona Needles, MD;   Location: [place];  Service: Orthopedics;  Laterality: Left;  supraclavicular block   POLYPECTOMY  12/09/2020   Procedure: POLYPECTOMY;  Surgeon: Gatha Mayer, MD;  Location[facility] ENDOSCOPY;  Service: Endoscopy;;   SPINAL FUSION  08/10/2021   T10-L2   TUBAL LIGATION      Social History   Socioeconomic History   Marital status: Married    Spouse name: Not on file   Number of children: 3   Years of education: 12   Highest education level: Not on file  Occupational History   Not on file  Tobacco Use   Smoking status: Former    Packs/day: 0.10    Years: 1.00    Pack years: 0.10    Types: Cigarettes    Quit date: 36    Years since quitting: 41.1   Smokeless tobacco: Never   Tobacco comments:  for a short period  Vaping Use   Vaping Use: Never used  Substance and Sexual Activity   Alcohol use: No   Drug use: No   Sexual activity: Not on file  Other Topics Concern   Not on file  Social History Narrative   Right handed   Caffeine use: coffee daily   Social Determinants of Health   Financial Resource Strain: Not on file  Food Insecurity: Not on file  Transportation Needs: Not on file  Physical Activity: Not on file  Stress: Not on file  Social Connections: Not on file  Intimate Partner Violence: Not on file        Objective:    BP 128/64    Pulse 82    Temp (!) 96.8 F (36 C) (Temporal)    Ht _0  (1.651 m)    Wt 118 lb (53.5 kg)    SpO2 93%    BMI 19.64 kg/m   Wt Readings from Last 3 Encounters:  11/29/21 118 lb (53.5 kg)  10/28/21 130 lb 1.1 oz (59 kg)  08/10/21 130 lb (59 kg)    Physical Exam Vitals reviewed.  Constitutional:      General: She is not in acute distress.    Appearance: Normal appearance. She is underweight. She is not ill-appearing, toxic-appearing or diaphoretic.  HENT:     Head: Normocephalic and atraumatic.  Eyes:     General: No scleral icterus.       Right eye: No discharge.        Left eye: No discharge.      Conjunctiva/sclera: Conjunctivae normal.  Cardiovascular:     Rate and Rhythm: Normal rate and regular rhythm.     Heart sounds: Normal heart sounds. No murmur heard.   No friction rub. No gallop.  Pulmonary:     Effort: Pulmonary effort is normal. No respiratory distress.     Breath sounds: Normal breath sounds. No stridor. No wheezing, rhonchi or rales.  Musculoskeletal:        General: Normal range of motion.     Cervical back: Normal range of motion.  Skin:    General: Skin is warm and dry.     Capillary Refill: Capillary refill takes less than 2 seconds.  Neurological:     General: No focal deficit present.     Mental Status: She is alert and oriented to person, place, and time. Mental status is at baseline.  Psychiatric:        Attention and Perception: Attention normal.        Mood and Affect: Mood normal.        Speech: Speech normal.        Behavior: Behavior is agitated. Behavior is not slowed, aggressive, withdrawn, hyperactive or combative. Behavior is cooperative.        Thought Content: Thought content is delusional. Thought content is not paranoid. Thought content does not include homicidal or suicidal ideation. Thought content does not include homicidal or suicidal plan.        Cognition and Memory: Cognition is impaired. Memory is impaired. She exhibits impaired recent memory.    Lab Results  Component Value Date   TSH 1.907 06/25/2021   Lab Results  Component Value Date   WBC 5.6 10/30/2021   HGB 10.9 (L) 10/30/2021   HCT 32.8 (L) 10/30/2021   MCV 85.4 10/30/2021   PLT 351 10/30/2021   Lab Results  Component Value Date   NA 132 (L) 10/30/2021  K 3.6 10/30/2021   CO2 24 10/30/2021   GLUCOSE 87 10/30/2021   BUN 12 10/30/2021   CREATININE 0.50 10/30/2021   BILITOT 0.7 10/29/2021   ALKPHOS 100 10/29/2021   AST 25 10/29/2021   ALT 13 10/29/2021   PROT 5.9 (L) 10/29/2021   ALBUMIN 2.4 (L) 10/29/2021   CALCIUM 8.4 (L) 10/30/2021   ANIONGAP 10  10/30/2021   EGFR 97 09/11/2021   Lab Results  Component Value Date   CHOL 267 (H) 06/26/2021   Lab Results  Component Value Date   HDL 49 06/26/2021   Lab Results  Component Value Date   LDLCALC 188 (H) 06/26/2021   Lab Results  Component Value Date   TRIG 152 (H) 06/26/2021   Lab Results  Component Value Date   CHOLHDL 5.4 06/26/2021   Lab Results  Component Value Date   HGBA1C 5.2 06/26/2021   HGBA1C 5.2 06/26/2021

## 2021-11-30 ENCOUNTER — Other Ambulatory Visit: Payer: Self-pay | Admitting: Family Medicine

## 2021-11-30 ENCOUNTER — Encounter: Payer: Self-pay | Admitting: Family Medicine

## 2021-11-30 DIAGNOSIS — E1165 Type 2 diabetes mellitus with hyperglycemia: Secondary | ICD-10-CM

## 2021-11-30 DIAGNOSIS — N3001 Acute cystitis with hematuria: Secondary | ICD-10-CM

## 2021-11-30 DIAGNOSIS — E119 Type 2 diabetes mellitus without complications: Secondary | ICD-10-CM

## 2021-11-30 HISTORY — DX: Type 2 diabetes mellitus without complications: E11.9

## 2021-11-30 LAB — CBC WITH DIFFERENTIAL/PLATELET
Basophils Absolute: 0 10*3/uL (ref 0.0–0.2)
Basos: 0 %
EOS (ABSOLUTE): 0.1 10*3/uL (ref 0.0–0.4)
Eos: 1 %
Hematocrit: 38.4 % (ref 34.0–46.6)
Hemoglobin: 12 g/dL (ref 11.1–15.9)
Immature Grans (Abs): 0 10*3/uL (ref 0.0–0.1)
Immature Granulocytes: 0 %
Lymphocytes Absolute: 1.5 10*3/uL (ref 0.7–3.1)
Lymphs: 14 %
MCH: 26.7 pg (ref 26.6–33.0)
MCHC: 31.3 g/dL — ABNORMAL LOW (ref 31.5–35.7)
MCV: 86 fL (ref 79–97)
Monocytes Absolute: 0.5 10*3/uL (ref 0.1–0.9)
Monocytes: 5 %
Neutrophils Absolute: 8.3 10*3/uL — ABNORMAL HIGH (ref 1.4–7.0)
Neutrophils: 80 %
Platelets: 511 10*3/uL — ABNORMAL HIGH (ref 150–450)
RBC: 4.49 x10E6/uL (ref 3.77–5.28)
RDW: 13.7 % (ref 11.7–15.4)
WBC: 10.4 10*3/uL (ref 3.4–10.8)

## 2021-11-30 LAB — TSH: TSH: 1 u[IU]/mL (ref 0.450–4.500)

## 2021-11-30 LAB — CMP14+EGFR
ALT: 15 IU/L (ref 0–32)
AST: 15 IU/L (ref 0–40)
Albumin/Globulin Ratio: 1.3 (ref 1.2–2.2)
Albumin: 4.2 g/dL (ref 3.7–4.7)
Alkaline Phosphatase: 129 IU/L — ABNORMAL HIGH (ref 44–121)
BUN/Creatinine Ratio: 22 (ref 12–28)
BUN: 16 mg/dL (ref 8–27)
Bilirubin Total: <0.2 mg/dL (ref 0.0–1.2)
CO2: 25 mmol/L (ref 20–29)
Calcium: 9.9 mg/dL (ref 8.7–10.3)
Chloride: 102 mmol/L (ref 96–106)
Creatinine, Ser: 0.72 mg/dL (ref 0.57–1.00)
Globulin, Total: 3.2 g/dL (ref 1.5–4.5)
Glucose: 138 mg/dL — ABNORMAL HIGH (ref 70–99)
Potassium: 4.8 mmol/L (ref 3.5–5.2)
Sodium: 142 mmol/L (ref 134–144)
Total Protein: 7.4 g/dL (ref 6.0–8.5)
eGFR: 87 mL/min/{1.73_m2} (ref >59)

## 2021-11-30 LAB — LIPID PANEL
Chol/HDL Ratio: 4.6 ratio — ABNORMAL HIGH (ref 0.0–4.4)
Cholesterol, Total: 251 mg/dL — ABNORMAL HIGH (ref 100–199)
HDL: 55 mg/dL (ref >39)
LDL Chol Calc (NIH): 157 mg/dL — ABNORMAL HIGH (ref 0–99)
Triglycerides: 214 mg/dL — ABNORMAL HIGH (ref 0–149)
VLDL Cholesterol Cal: 39 mg/dL (ref 5–40)

## 2021-11-30 LAB — VITAMIN B12: Vitamin B-12: 1027 pg/mL (ref 232–1245)

## 2021-11-30 LAB — RPR: RPR Ser Ql: NONREACTIVE

## 2021-11-30 MED ORDER — CEFDINIR 300 MG PO CAPS: 300.0000 mg | ORAL_CAPSULE | Freq: Two times a day (BID) | ORAL | 0 refills | Status: AC

## 2021-11-30 MED ORDER — METFORMIN HCL ER 500 MG PO TB24: 500.0000 mg | ORAL_TABLET | Freq: Every day | ORAL | 2 refills | Status: DC

## 2021-12-02 ENCOUNTER — Encounter: Payer: Self-pay | Admitting: Family Medicine

## 2021-12-06 ENCOUNTER — Telehealth: Payer: Self-pay | Admitting: Family Medicine

## 2021-12-06 ENCOUNTER — Encounter: Payer: Self-pay | Admitting: Family Medicine

## 2021-12-06 DIAGNOSIS — N3001 Acute cystitis with hematuria: Secondary | ICD-10-CM

## 2021-12-06 DIAGNOSIS — R4189 Other symptoms and signs involving cognitive functions and awareness: Secondary | ICD-10-CM

## 2021-12-07 ENCOUNTER — Other Ambulatory Visit: Payer: Self-pay | Admitting: Family Medicine

## 2021-12-07 DIAGNOSIS — I10 Essential (primary) hypertension: Secondary | ICD-10-CM

## 2021-12-07 NOTE — Telephone Encounter (Signed)
The referral has been placed. I didn't place it initially because I had sent a direct message to Dr. Felecia Shelling in [place]. I will MyChart message patient/daughter and make them aware so they do not need to be contacted. ?

## 2021-12-11 ENCOUNTER — Other Ambulatory Visit: Payer: Medicare Other

## 2021-12-11 DIAGNOSIS — N3001 Acute cystitis with hematuria: Secondary | ICD-10-CM

## 2021-12-12 ENCOUNTER — Encounter: Payer: Self-pay | Admitting: Neurology

## 2021-12-12 ENCOUNTER — Other Ambulatory Visit: Payer: Self-pay

## 2021-12-12 ENCOUNTER — Ambulatory Visit: Payer: Medicare Other | Admitting: Neurology

## 2021-12-12 VITALS — BP 208/85 | HR 79 | Ht 65.0 in | Wt 116.5 lb

## 2021-12-12 DIAGNOSIS — F22 Delusional disorders: Secondary | ICD-10-CM

## 2021-12-12 DIAGNOSIS — M316 Other giant cell arteritis: Secondary | ICD-10-CM | POA: Diagnosis not present

## 2021-12-12 DIAGNOSIS — R451 Restlessness and agitation: Secondary | ICD-10-CM

## 2021-12-12 DIAGNOSIS — R4189 Other symptoms and signs involving cognitive functions and awareness: Secondary | ICD-10-CM

## 2021-12-12 DIAGNOSIS — I639 Cerebral infarction, unspecified: Secondary | ICD-10-CM

## 2021-12-12 MED ORDER — HYDROXYZINE HCL 10 MG PO TABS: ORAL_TABLET | ORAL | 5 refills | Status: DC

## 2021-12-12 MED ORDER — OLANZAPINE 2.5 MG PO TABS: 2.5000 mg | ORAL_TABLET | Freq: Every day | ORAL | 5 refills | Status: DC

## 2021-12-12 NOTE — Progress Notes (Signed)
[facility]  PATIENT: Mckenzie [name] DOB: 20-May-1946  REFERRING DOCTOR OR PCP:  Dr. Gladstone Lighter ([facility]); Hendricks Limes. FNP (PCP) SOURCE: Patient, notes from Dr. Gladstone Lighter, available lab work  _________________________________   HISTORICAL  CHIEF COMPLAINT:  Chief Complaint  Patient presents with   Follow-up    RM EMG 3, with daughter. Last seen 01/23/21. Has had about 5 falls in last yr. Ambulates w/ walker. Had to have back surgery after one of the falls. This was around 08/2021.    HISTORY OF PRESENT ILLNESS:  Mckenzie [name] is a 76 y.o. woman with weakness and cognitive changes who was recently diagnosed with giant cell arteritis.    UPDATE 12/12/2021: Prednisone 5 mg po daily has helped he GCA related gait issues.    However, cognitive issues have progressed.  Family noted she had delirium January 2022 while hospitalized for a fractured wrist/surgery.    However, she quickly recovered when she left the hospital.   Going October 2022, she was on bedrest due to back issues and had much more agitation and confusion delirium.   She had agitation, mild violence, anger, paranoia (believes husband has 52 yo girlfriend and the girl is hiding in a hole in the house ; men are in the chimney).   She sees the girl and the other people and hears them.   She believes her husband is having an affair with her.  She shakes with agitation some days and has struck out at people.    She has no REM behavior disorder.    She has orthostatic hypotension.      Compared to last year, her gait is the same --- she ws better last summer but worsened when back issues worsened.     She had an MRI 08/13/2021 showing chronic right occipital and bilateral, left greater than right cerebellar infarcts and moderate chronic microvascular ischemic change.   She has mild atrophy  CT scan 12/08/2020 clearly shows the cerebellar infarctions.  And mild atrophy  CT scan 10/05/2020 (after a fall)  show mild hypodensity consistent with possible early/acute left greater than right cerebellar infarctions.  CT 01/10/2016 did not show these infarctions.  She has depression and anxiety.  She is on Lexapro 20 mg.  History of giant cell arteritis: She is on prednisone for giant cell arteritis, started in March 2022 during a hospital admission (admitted with further weakness and hypotension).    Biopsy from 12/15/20 was consistent with GCA on bilateral specimens.She was started on prednisone and is currently on prednisone 50 mg po qD.    She is now seeing Dr. Benjamine Mola (Rheumatology).  He is managing the prednisone and has discussed anti-IL6R therapy as a steroid sparing option.   Interestingly, ESR was normal December 2021 (though CRP was elevated) but ESR was elevated in March 2022 when she was in the hospital prompting the biopsy.    Imaging: MRI of the lumbar spine performed at [facility] 10/04/2020.  It shows multilevel degenerative changes.    At L2-L3, there is advanced facet hypertrophy with ligamenta flava hypertrophy and minimal retrolisthesis.  There is moderate spinal stenosis.  Mild to moderate foraminal narrowing and moderate lateral recess stenosis.  No definite nerve root compression.  At L3-L4, there is advanced facet hypertrophy, ligamenta flava hypertrophy, disc protrusion all combining to cause moderate spinal stenosis, mild to moderate foraminal narrowing, moderately severe lateral recess stenosis with potential for L4 nerve root compression.  At L4-L5, there is 4 mm  anterolisthesis, facet hypertrophy.  Mild foraminal narrowing and moderate left and moderately severe right lateral recess stenosis.  Possible right L5 nerve root compression.  L5-S1 shows facet hypertrophy but no nerve root compression.     REVIEW OF SYSTEMS: Constitutional: No fevers, chills, sweats, or change in appetite Eyes: No visual changes, double vision, eye pain Ear, nose and throat: No hearing loss, ear  pain, nasal congestion, sore throat Cardiovascular: No chest pain, palpitations Respiratory:  No shortness of breath at rest or with exertion.   No wheezes GastrointestinaI: No nausea, vomiting, diarrhea, abdominal pain, fecal incontinence Genitourinary:  No dysuria, urinary retention or frequency.  No nocturia. Musculoskeletal:  No neck pain, back pain Integumentary: No rash, pruritus, skin lesions Neurological: as above Psychiatric: No depression at this time.  No anxiety Endocrine: No palpitations, diaphoresis, change in appetite, change in weigh or increased thirst Hematologic/Lymphatic:  No anemia, purpura, petechiae. Allergic/Immunologic: No itchy/runny eyes, nasal congestion, recent allergic reactions, rashes  ALLERGIES: Allergies  Allergen Reactions   Aldomet [Methyldopa] Other (See Comments)    Flu-like symptoms   Fosamax [Alendronate Sodium] Other (See Comments)    Weakness/fatigue    Plavix [Clopidogrel] Other (See Comments)    Arm swelled, bleeding   Acetazolamide Rash   Buspar [Buspirone] Palpitations    HOME MEDICATIONS:  Current Outpatient Medications:    amLODipine (NORVASC) 5 MG tablet, Take 1 tablet by mouth once daily, Disp: 90 tablet, Rfl: 0   aspirin EC 81 MG EC tablet, Take 1 tablet (81 mg total) by mouth daily. X one month, then stop, Disp: 30 tablet, Rfl: 0   Bromfenac Sodium 0.07 % SOLN, Place 1 drop into the right eye every evening., Disp: , Rfl:    Calcium Carb-Cholecalciferol (CALCIUM+D3 PO), Take 2 tablets by mouth in the morning., Disp: , Rfl:    COMBIGAN 0.2-0.5 % ophthalmic solution, Place 1 drop into both eyes every 12 (twelve) hours., Disp: , Rfl:    dorzolamide (TRUSOPT) 2 % ophthalmic solution, Place 1 drop into the left eye 2 (two) times daily., Disp: , Rfl:    Ergocalciferol (VITAMIN D2) 50 MCG (2000 UT) TABS, Take 1 tablet by mouth daily., Disp: 30 tablet, Rfl: 6   escitalopram (LEXAPRO) 20 MG tablet, Take 1 tablet (20 mg total) by mouth  daily., Disp: 90 tablet, Rfl: 1   fluorometholone (FML) 0.1 % ophthalmic suspension, Place 1 drop into the right eye 2 (two) times daily., Disp: , Rfl:    hydrOXYzine (ATARAX) 10 MG tablet, One or two po at bedtime, Disp: 60 tablet, Rfl: 5   latanoprost (XALATAN) 0.005 % ophthalmic solution, Place 1 drop into both eyes at bedtime., Disp: , Rfl:    lisinopril (ZESTRIL) 10 MG tablet, Take 1 tablet (10 mg total) by mouth daily., Disp: 30 tablet, Rfl: 2   LORazepam (ATIVAN) 0.5 MG tablet, Take 1 tablet (0.5 mg total) by mouth 2 (two) times daily as needed for anxiety., Disp: 30 tablet, Rfl: 2   magnesium oxide (MAG-OX) 400 MG tablet, Take 400 mg by mouth in the morning., Disp: , Rfl:    metFORMIN (GLUCOPHAGE-XR) 500 MG 24 hr tablet, Take 1 tablet (500 mg total) by mouth daily with breakfast., Disp: 30 tablet, Rfl: 2   Multiple Vitamin (MULTIVITAMIN WITH MINERALS) TABS tablet, Take 1 tablet by mouth in the morning. One A Day Multivitamin, Disp: , Rfl:    OLANZapine (ZYPREXA) 2.5 MG tablet, Take 1 tablet (2.5 mg total) by mouth at bedtime., Disp: 30 tablet,  Rfl: 5   PREDNISONE PO, Take 5 mg by mouth daily., Disp: , Rfl:   PAST MEDICAL HISTORY: Past Medical History:  Diagnosis Date   Anemia    low iron   Arthritis    Depression    Diabetes ([place]) 11/30/2021   High cholesterol    Hypertension    Retinal micro-aneurysm of right eye    Stroke ([place])    2 or 3 mini strokes    PAST SURGICAL HISTORY: Past Surgical History:  Procedure Laterality Date   ARTERY BIOPSY Bilateral 12/15/2020   Procedure: BILATERAL TEMPORAL ARTERY BIOPSIES;  Surgeon: Angelia Mould, MD;  Location: [facility] OR;  Service: Vascular;  Laterality: Bilateral;   COLONOSCOPY WITH PROPOFOL N/A 12/09/2020   Procedure: COLONOSCOPY WITH PROPOFOL;  Surgeon: Gatha Mayer, MD;  Location: [facility] ENDOSCOPY;  Service: Endoscopy;  Laterality: N/A;   ESOPHAGOGASTRODUODENOSCOPY (EGD) WITH PROPOFOL N/A 12/09/2020   Procedure:  ESOPHAGOGASTRODUODENOSCOPY (EGD) WITH PROPOFOL;  Surgeon: Gatha Mayer, MD;  Location: [place];  Service: Endoscopy;  Laterality: N/A;   EYE SURGERY     FINGER SURGERY Left    RING FINGER   IR THORACENTESIS ASP PLEURAL SPACE W/IMG GUIDE  12/13/2020   OPEN REDUCTION INTERNAL FIXATION (ORIF) DISTAL RADIAL FRACTURE Left 10/05/2020   Procedure: OPEN REDUCTION INTERNAL FIXATION (ORIF) DISTAL RADIAL FRACTURE;  Surgeon: Shona Needles, MD;  Location: [facility];  Service: Orthopedics;  Laterality: Left;  supraclavicular block   POLYPECTOMY  12/09/2020   Procedure: POLYPECTOMY;  Surgeon: Gatha Mayer, MD;  Location: [facility] ENDOSCOPY;  Service: Endoscopy;;   SPINAL FUSION  08/10/2021   T10-L2   TUBAL LIGATION      FAMILY HISTORY: Family History  Problem Relation Age of Onset   Hyperlipidemia Mother    Hypertension Mother    Diabetes Mother    COPD Father    Depression Sister    Suicidality Brother    Drug abuse Brother    Healthy Daughter    Healthy Daughter    Healthy Daughter    Colon cancer Neg Hx    Celiac disease Neg Hx    Inflammatory bowel disease Neg Hx     SOCIAL HISTORY:  Social History   Socioeconomic History   Marital status: Married    Spouse name: Not on file   Number of children: 3   Years of education: 12   Highest education level: Not on file  Occupational History   Not on file  Tobacco Use   Smoking status: Former    Packs/day: 0.10    Years: 1.00    Pack years: 0.10    Types: Cigarettes    Quit date: 1982    Years since quitting: 41.2   Smokeless tobacco: Never   Tobacco comments:    for a short period  Media planner   Vaping Use: Never used  Substance and Sexual Activity   Alcohol use: No   Drug use: No   Sexual activity: Not on file  Other Topics Concern   Not on file  Social History Narrative   Right handed   Caffeine use: coffee daily   Social Determinants of Health   Financial Resource Strain: Not on file  Food Insecurity: Not on  file  Transportation Needs: Not on file  Physical Activity: Not on file  Stress: Not on file  Social Connections: Not on file  Intimate Partner Violence: Not on file     PHYSICAL EXAM  Vitals:   12/12/21 1313  BP: (!) 208/85  Pulse: 79  Weight: 116 lb 8 oz (52.8 kg)  Height: _0  (1.651 m)    Body mass index is 19.39 kg/m.   General: The patient is well-developed and well-nourished and in no acute distress at the beginning of the visit but she became a little agitated when we were talking about her hallucinations/paranoia  HEENT:  Head is [place]/AT.  Sclera are anicteric.     Neck:   The neck is non- tender over the paraspinal and other muscles.  Range of motion was normal in the neck.    Skin: Extremities are without rash or  Edema.  Some bruises are noted on the arms.  Neurologic Exam  Mental status: The patient is alert and oriented x 2 at the time of the examination.  She had reduced focus/attention.  She was able to follow simple two-step commands.  Speech is normal.  She became agitated when we discussed her hallucinations  Cranial nerves: Extraocular movements are full.  Normal facial strength and sensation.   No ptosis. No obvious hearing deficits are noted.  Motor: Muscle bulk is normal.  Muscle tone is normal.  No cogwheeling.  Strength was 4/5 in the iliopsoas muscles, 4+/5 in the quadriceps and shoulder and neck extensor muscles.   Shoulders now 5/5.    Strength is 5/5 distally..   Sensory: Sensory testing is intact to pinprick, soft touch and vibration sensation in all 4 extremities.  Coordination: Cerebellar testing reveals good finger-nose-finger and slightly reduced heel-to-shin bilaterally.  Gait and station: Station is normal.  She is able to take a few steps without support but does fairly well with bilateral support (walker).  Romberg is negative.   Reflexes: Deep tendon reflexes are symmetric and normal bilaterally.        DIAGNOSTIC DATA (LABS,  IMAGING, TESTING) - I reviewed patient records, labs, notes, testing and imaging myself where available.  Lab Results  Component Value Date   WBC 10.4 11/29/2021   HGB 12.0 11/29/2021   HCT 38.4 11/29/2021   MCV 86 11/29/2021   PLT 511 (H) 11/29/2021      Component Value Date/Time   NA 142 11/29/2021 1527   K 4.8 11/29/2021 1527   CL 102 11/29/2021 1527   CO2 25 11/29/2021 1527   GLUCOSE 138 (H) 11/29/2021 1527   GLUCOSE 87 10/30/2021 0249   BUN 16 11/29/2021 1527   CREATININE 0.72 11/29/2021 1527   CREATININE 0.66 04/25/2021 1053   CALCIUM 9.9 11/29/2021 1527   PROT 7.4 11/29/2021 1527   ALBUMIN 4.2 11/29/2021 1527   AST 15 11/29/2021 1527   ALT 15 11/29/2021 1527   ALKPHOS 129 (H) 11/29/2021 1527   BILITOT <0.2 11/29/2021 1527   GFRNONAA >60 10/30/2021 0249   GFRNONAA 90 01/23/2018 0803   GFRAA 120 12/01/2020 1127   GFRAA 104 01/23/2018 0803   Lab Results  Component Value Date   CHOL 251 (H) 11/29/2021   HDL 55 11/29/2021   LDLCALC 157 (H) 11/29/2021   TRIG 214 (H) 11/29/2021   CHOLHDL 4.6 (H) 11/29/2021       ASSESSMENT AND PLAN  Paranoia (HCC)  Cognitive change  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HCC)  Agitation  Cerebellar stroke (HCC)  1.   She will continue low-dose prednisone for GCA (on only 5 mg daily).  However, if agitation/psychosis continues to worsen we will need to consider reducing the dose.  I think it is unlikely that prednisone is playing a major role in  her cognitive symptoms as a worsened quite a bit about 8 months after starting    2.   To help with the delirium/agitation/psychosis I will have her start Zyprexa 2.5 mg once a day and hydroxyzine 10 to 20 mg at night.  She should stop the Tylenol PM for the insomnia as the diphenhydramine might worsen cognition.   3.   Mood is much better on Lexapro.  She will continue. 4.   Reviewing multiple imaging studies, it appears as though the cerebellar strokes might have been acute in December 2021.   Therefore, they likely are playing a role in her gait issues but less of overall and her cognitive issues, though cerebellar strokes can cause cognitive dysfunction.  Other hallucinations are prominent with Lewy body dementia, she does not have other symptoms such as increased muscle tone, eye movement abnormalities, REM behavior disorder.  Additionally visual hallucinations with Lewy body disease are usually nonthreatening.   5.   BP is elevated.  Family will take her blood pressure again tonight and contact primary care if it remains elevated.   Return in 6 months or sooner if there are new or worsening neurologic symptoms.  57-minute office visit with the majority of the time spent face-to-face for history and physical, discussion/counseling and decision-making.  Additional time with record review and documentation.    Belina Mandile A. Felecia Shelling, MD, [facility] 02/12/6824, 7:49 PM Certified in Neurology, Clinical Neurophysiology, Sleep Medicine and Neuroimaging  [facility] [address], [place], [place] [address] [phone] I

## 2021-12-12 NOTE — Patient Instructions (Addendum)
Stop the Tylenol PM. ? ?Begin olanzapine 2.5 mg once a day.  Take hydroxyzine 10 to 20 mg (1 or 2 pills) at bedtime but were present before her agitation/hallucinations but were present before her cognitive changes.  BP is elevated.  The family will take her blood pressure again when she is home and contact primary care if it remains elevated. ? ?Recheck the blood pressure when you get home.  If it remains elevated contact her primary care provider ?

## 2021-12-14 ENCOUNTER — Encounter: Payer: Self-pay | Admitting: Family Medicine

## 2021-12-14 LAB — URINE CULTURE

## 2021-12-14 MED ORDER — CEPHALEXIN 500 MG PO CAPS: 500.0000 mg | ORAL_CAPSULE | Freq: Two times a day (BID) | ORAL | 0 refills | Status: AC

## 2021-12-14 NOTE — Telephone Encounter (Signed)
Antibiotic sent. Daughter aware all future UTIs need to be addressed by urology. ?

## 2021-12-14 NOTE — Telephone Encounter (Signed)
Please call patient ASAP about her urine. Has questions about the bacteria that was found and also wants to know if a different antibiotic is going to be called in for her? ?

## 2021-12-14 NOTE — Telephone Encounter (Signed)
Returned call and answered all questions. She is aware of the risk of continuing on abx and is agreeable.  Would like treatment.  ?

## 2021-12-19 ENCOUNTER — Encounter: Payer: Self-pay | Admitting: Neurology

## 2021-12-19 ENCOUNTER — Other Ambulatory Visit: Payer: Self-pay | Admitting: *Deleted

## 2021-12-19 MED ORDER — OLANZAPINE 2.5 MG PO TABS: 5.0000 mg | ORAL_TABLET | Freq: Every day | ORAL | 5 refills | Status: DC

## 2021-12-28 ENCOUNTER — Encounter: Payer: Self-pay | Admitting: Neurology

## 2021-12-28 DIAGNOSIS — R451 Restlessness and agitation: Secondary | ICD-10-CM

## 2021-12-28 DIAGNOSIS — R4189 Other symptoms and signs involving cognitive functions and awareness: Secondary | ICD-10-CM

## 2021-12-28 DIAGNOSIS — F22 Delusional disorders: Secondary | ICD-10-CM

## 2021-12-31 ENCOUNTER — Encounter (INDEPENDENT_AMBULATORY_CARE_PROVIDER_SITE_OTHER): Payer: Medicare Other | Admitting: Ophthalmology

## 2021-12-31 ENCOUNTER — Telehealth: Payer: Self-pay | Admitting: Neurology

## 2021-12-31 NOTE — Telephone Encounter (Signed)
Pt's daughter is calling re: the medication pt is on at bed time for her hallucinations.  Daughter is asking if there needs to be an increase or a change because there is little to no improvement and times that pt is worsening.  Please call. ?

## 2021-12-31 NOTE — Telephone Encounter (Signed)
A message was sent to Dr Felecia Shelling for him to review through mychart. Waiting to see his recommendation. ?

## 2022-01-01 MED ORDER — QUETIAPINE FUMARATE 25 MG PO TABS: 25.0000 mg | ORAL_TABLET | Freq: Every day | ORAL | 0 refills | Status: DC

## 2022-01-01 NOTE — Addendum Note (Signed)
Addended by: Darleen Crocker on: 01/01/2022 01:22 PM ? ? Modules accepted: Orders ? ?

## 2022-01-01 NOTE — Telephone Encounter (Signed)
Called the daughter back in regards to her question that she had sent on my chart. She was concerned about whether we were starting her on too low of a dose. Advised the patient's daughter that anytime we are adding a new medication on board and the fact she is taking other medications it is best to start out with a lower dose. She will continue the hydroxyzine and along with adding the Seroquel informed her that if in two-3 nights of taking the 25 mg strength they are not seeing improvement, they can send [place] a message or call us and let us know and we can discuss potentially increasing the dose at that time. Pt's daughter verbalized understanding and had no further questions and asked that the med be sent to [facility] in [place].  ? ? ? ?"Could you give me a call to discuss this? When we looked this up it seems most of the time they take 50 mg and up, would we be increasing this over time? We are fine switching but want to make sure what she takes is strong enough.  ? I am assuming we will continue the atarax? " ?  ?

## 2022-01-01 NOTE — Telephone Encounter (Signed)
Pt's daughter would like a call from the nurse to discuss the medication change. ?

## 2022-01-07 ENCOUNTER — Telehealth: Payer: Self-pay | Admitting: Neurology

## 2022-01-07 NOTE — Telephone Encounter (Signed)
Referral sent to [facility] [phone]. ?

## 2022-01-08 ENCOUNTER — Encounter: Payer: Self-pay | Admitting: Internal Medicine

## 2022-01-08 DIAGNOSIS — M316 Other giant cell arteritis: Secondary | ICD-10-CM

## 2022-01-10 MED ORDER — PREDNISONE 5 MG PO TABS: 5.0000 mg | ORAL_TABLET | Freq: Every day | ORAL | 1 refills | Status: DC

## 2022-01-21 ENCOUNTER — Telehealth: Payer: Self-pay | Admitting: Internal Medicine

## 2022-01-21 NOTE — Telephone Encounter (Signed)
I spoke with Dr. Casimiro Needle who is seeing Mckenzie [name] due to severe psychiatric symptoms with delirium and he has concern about these being exacerbated with ongoing prednisone treatment. We have not followed up in office since October she is currently maintaining a low dose 5 mg PO daily for GCA. We should schedule a follow up visit soon ideally <2 wks to discuss options since this may be a significant side effect. ?

## 2022-01-22 ENCOUNTER — Ambulatory Visit: Payer: Self-pay | Admitting: Internal Medicine

## 2022-01-22 ENCOUNTER — Emergency Department (EMERGENCY_DEPARTMENT_HOSPITAL)
Admission: EM | Admit: 2022-01-22 | Discharge: 2022-01-22 | Disposition: A | Discharge status: Home / Self Care | Payer: Medicare Other | Source: Home / Self Care | Attending: Emergency Medicine | Admitting: Emergency Medicine

## 2022-01-22 ENCOUNTER — Inpatient Hospital Stay
Admission: AD | Admit: 2022-01-22 | Discharge: 2022-01-30 | DRG: 880 | Disposition: A | Discharge status: Home / Self Care | Payer: Medicare Other | Source: Intra-hospital | Attending: Psychiatry | Admitting: Psychiatry

## 2022-01-22 ENCOUNTER — Encounter: Payer: Self-pay | Admitting: Internal Medicine

## 2022-01-22 ENCOUNTER — Telehealth: Payer: Self-pay | Admitting: Internal Medicine

## 2022-01-22 ENCOUNTER — Other Ambulatory Visit: Payer: Self-pay

## 2022-01-22 DIAGNOSIS — E119 Type 2 diabetes mellitus without complications: Secondary | ICD-10-CM | POA: Insufficient documentation

## 2022-01-22 DIAGNOSIS — Z79899 Other long term (current) drug therapy: Secondary | ICD-10-CM

## 2022-01-22 DIAGNOSIS — R441 Visual hallucinations: Secondary | ICD-10-CM | POA: Diagnosis present

## 2022-01-22 DIAGNOSIS — I10 Essential (primary) hypertension: Secondary | ICD-10-CM | POA: Insufficient documentation

## 2022-01-22 DIAGNOSIS — Z818 Family history of other mental and behavioral disorders: Secondary | ICD-10-CM | POA: Diagnosis not present

## 2022-01-22 DIAGNOSIS — Z85038 Personal history of other malignant neoplasm of large intestine: Secondary | ICD-10-CM | POA: Insufficient documentation

## 2022-01-22 DIAGNOSIS — Z7984 Long term (current) use of oral hypoglycemic drugs: Secondary | ICD-10-CM | POA: Diagnosis not present

## 2022-01-22 DIAGNOSIS — Z8616 Personal history of COVID-19: Secondary | ICD-10-CM | POA: Diagnosis not present

## 2022-01-22 DIAGNOSIS — R440 Auditory hallucinations: Secondary | ICD-10-CM | POA: Diagnosis present

## 2022-01-22 DIAGNOSIS — R443 Hallucinations, unspecified: Principal | ICD-10-CM | POA: Diagnosis present

## 2022-01-22 DIAGNOSIS — M316 Other giant cell arteritis: Secondary | ICD-10-CM | POA: Diagnosis present

## 2022-01-22 DIAGNOSIS — F22 Delusional disorders: Secondary | ICD-10-CM | POA: Insufficient documentation

## 2022-01-22 DIAGNOSIS — Z20822 Contact with and (suspected) exposure to covid-19: Secondary | ICD-10-CM | POA: Diagnosis present

## 2022-01-22 DIAGNOSIS — R3981 Functional urinary incontinence: Secondary | ICD-10-CM | POA: Diagnosis not present

## 2022-01-22 DIAGNOSIS — Z981 Arthrodesis status: Secondary | ICD-10-CM | POA: Diagnosis not present

## 2022-01-22 DIAGNOSIS — Z8673 Personal history of transient ischemic attack (TIA), and cerebral infarction without residual deficits: Secondary | ICD-10-CM | POA: Diagnosis not present

## 2022-01-22 DIAGNOSIS — Z87891 Personal history of nicotine dependence: Secondary | ICD-10-CM | POA: Diagnosis not present

## 2022-01-22 DIAGNOSIS — M4854XD Collapsed vertebra, not elsewhere classified, thoracic region, subsequent encounter for fracture with routine healing: Secondary | ICD-10-CM | POA: Diagnosis not present

## 2022-01-22 LAB — COMPREHENSIVE METABOLIC PANEL
ALT: 19 U/L (ref 0–44)
AST: 20 U/L (ref 15–41)
Albumin: 3.7 g/dL (ref 3.5–5.0)
Alkaline Phosphatase: 113 U/L (ref 38–126)
Anion gap: 9 (ref 5–15)
BUN: 16 mg/dL (ref 8–23)
CO2: 27 mmol/L (ref 22–32)
Calcium: 9.7 mg/dL (ref 8.9–10.3)
Chloride: 104 mmol/L (ref 98–111)
Creatinine, Ser: 0.72 mg/dL (ref 0.44–1.00)
GFR, Estimated: >60 mL/min (ref >60)
Glucose, Bld: 141 mg/dL — ABNORMAL HIGH (ref 70–99)
Potassium: 3.3 mmol/L — ABNORMAL LOW (ref 3.5–5.1)
Sodium: 140 mmol/L (ref 135–145)
Total Bilirubin: 0.5 mg/dL (ref 0.3–1.2)
Total Protein: 8.3 g/dL — ABNORMAL HIGH (ref 6.5–8.1)

## 2022-01-22 LAB — URINE DRUG SCREEN, QUALITATIVE (ARMC ONLY)
Amphetamines, Ur Screen: NOT DETECTED
Barbiturates, Ur Screen: NOT DETECTED
Benzodiazepine, Ur Scrn: POSITIVE — AB
Cannabinoid 50 Ng, Ur ~~LOC~~: NOT DETECTED
Cocaine Metabolite,Ur ~~LOC~~: NOT DETECTED
MDMA (Ecstasy)Ur Screen: NOT DETECTED
Methadone Scn, Ur: NOT DETECTED
Opiate, Ur Screen: NOT DETECTED
Phencyclidine (PCP) Ur S: NOT DETECTED
Tricyclic, Ur Screen: NOT DETECTED

## 2022-01-22 LAB — CBC
HCT: 40.5 % (ref 36.0–46.0)
Hemoglobin: 12.5 g/dL (ref 12.0–15.0)
MCH: 26.2 pg (ref 26.0–34.0)
MCHC: 30.9 g/dL (ref 30.0–36.0)
MCV: 84.7 fL (ref 80.0–100.0)
Platelets: 470 10*3/uL — ABNORMAL HIGH (ref 150–400)
RBC: 4.78 MIL/uL (ref 3.87–5.11)
RDW: 16.8 % — ABNORMAL HIGH (ref 11.5–15.5)
WBC: 8.5 10*3/uL (ref 4.0–10.5)
nRBC: 0 % (ref 0.0–0.2)

## 2022-01-22 LAB — ACETAMINOPHEN LEVEL: Acetaminophen (Tylenol), Serum: <10 ug/mL — ABNORMAL LOW (ref 10–30)

## 2022-01-22 LAB — SALICYLATE LEVEL: Salicylate Lvl: <7 mg/dL — ABNORMAL LOW (ref 7.0–30.0)

## 2022-01-22 LAB — RESP PANEL BY RT-PCR (FLU A&B, COVID) ARPGX2
Influenza A by PCR: NEGATIVE
Influenza B by PCR: NEGATIVE
SARS Coronavirus 2 by RT PCR: NEGATIVE

## 2022-01-22 LAB — ETHANOL: Alcohol, Ethyl (B): <10 mg/dL (ref <10)

## 2022-01-22 MED ORDER — ADULT MULTIVITAMIN W/MINERALS CH
1.0000 | ORAL_TABLET | Freq: Every day | ORAL | Status: DC
  Administered 2022-01-23 – 2022-01-30 (×8): 1 via ORAL
  Filled 2022-01-22 (×8): qty 1

## 2022-01-22 MED ORDER — DORZOLAMIDE HCL 2 % OP SOLN
1.0000 [drp] | Freq: Two times a day (BID) | OPHTHALMIC | Status: DC
  Filled 2022-01-22: qty 10

## 2022-01-22 MED ORDER — BUPROPION HCL ER (XL) 150 MG PO TB24: 150.0000 mg | ORAL_TABLET | Freq: Every day | ORAL | Status: DC

## 2022-01-22 MED ORDER — VITAMIN D2 50 MCG (2000 UT) PO TABS
1.0000 | ORAL_TABLET | Freq: Every day | ORAL | Status: DC
Start: 2022-01-22 — End: 2022-01-22

## 2022-01-22 MED ORDER — ACETAMINOPHEN 325 MG PO TABS: 650.0000 mg | ORAL_TABLET | Freq: Four times a day (QID) | ORAL | Status: DC | PRN

## 2022-01-22 MED ORDER — PREDNISONE 10 MG PO TABS: 5.0000 mg | ORAL_TABLET | Freq: Every day | ORAL | Status: DC

## 2022-01-22 MED ORDER — AMLODIPINE BESYLATE 5 MG PO TABS
5.0000 mg | ORAL_TABLET | Freq: Every day | ORAL | Status: DC
Start: 2022-01-23 — End: 2022-01-25
  Administered 2022-01-23 – 2022-01-25 (×3): 5 mg via ORAL
  Filled 2022-01-22 (×3): qty 1

## 2022-01-22 MED ORDER — ADULT MULTIVITAMIN W/MINERALS CH: 1.0000 | ORAL_TABLET | Freq: Every morning | ORAL | Status: DC

## 2022-01-22 MED ORDER — BUPROPION HCL ER (XL) 150 MG PO TB24
150.0000 mg | ORAL_TABLET | Freq: Every day | ORAL | Status: DC
  Administered 2022-01-23 – 2022-01-30 (×8): 150 mg via ORAL
  Filled 2022-01-22 (×8): qty 1

## 2022-01-22 MED ORDER — MAGNESIUM OXIDE 400 MG PO TABS: 400.0000 mg | ORAL_TABLET | Freq: Every day | ORAL | Status: DC

## 2022-01-22 MED ORDER — TIMOLOL MALEATE 0.5 % OP SOLN
1.0000 [drp] | Freq: Two times a day (BID) | OPHTHALMIC | Status: DC
  Filled 2022-01-22: qty 5

## 2022-01-22 MED ORDER — KETOROLAC TROMETHAMINE 0.5 % OP SOLN
1.0000 [drp] | Freq: Every day | OPHTHALMIC | Status: DC
  Filled 2022-01-22: qty 3

## 2022-01-22 MED ORDER — ARIPIPRAZOLE 2 MG PO TABS
2.0000 mg | ORAL_TABLET | Freq: Every morning | ORAL | Status: DC
Start: 2022-01-23 — End: 2022-01-22

## 2022-01-22 MED ORDER — LISINOPRIL 10 MG PO TABS
10.0000 mg | ORAL_TABLET | Freq: Every day | ORAL | Status: DC
Start: 2022-01-22 — End: 2022-01-22
  Administered 2022-01-22: 10 mg via ORAL

## 2022-01-22 MED ORDER — BRIMONIDINE TARTRATE 0.2 % OP SOLN
1.0000 [drp] | Freq: Two times a day (BID) | OPHTHALMIC | Status: DC
  Filled 2022-01-22: qty 5

## 2022-01-22 MED ORDER — KETOROLAC TROMETHAMINE 0.5 % OP SOLN
1.0000 [drp] | Freq: Every day | OPHTHALMIC | Status: DC
  Administered 2022-01-22: 1 [drp] via OPHTHALMIC
  Filled 2022-01-22: qty 3

## 2022-01-22 MED ORDER — LATANOPROST 0.005 % OP SOLN
1.0000 [drp] | Freq: Every day | OPHTHALMIC | Status: DC
  Administered 2022-01-23 – 2022-01-29 (×7): 1 [drp] via OPHTHALMIC
  Filled 2022-01-22: qty 2.5

## 2022-01-22 MED ORDER — MAGNESIUM HYDROXIDE 400 MG/5ML PO SUSP: 30.0000 mL | Freq: Every day | ORAL | Status: DC | PRN

## 2022-01-22 MED ORDER — METFORMIN HCL ER 500 MG PO TB24: 500.0000 mg | ORAL_TABLET | Freq: Every day | ORAL | Status: DC

## 2022-01-22 MED ORDER — FLUOROMETHOLONE 0.1 % OP SUSP
1.0000 [drp] | Freq: Two times a day (BID) | OPHTHALMIC | Status: DC
  Administered 2022-01-23 – 2022-01-30 (×16): 1 [drp] via OPHTHALMIC
  Filled 2022-01-22: qty 5

## 2022-01-22 MED ORDER — LORAZEPAM 0.5 MG PO TABS: 0.5000 mg | ORAL_TABLET | Freq: Two times a day (BID) | ORAL | Status: DC | PRN

## 2022-01-22 MED ORDER — AMLODIPINE BESYLATE 5 MG PO TABS
5.0000 mg | ORAL_TABLET | Freq: Every day | ORAL | Status: DC
Start: 2022-01-22 — End: 2022-01-22
  Administered 2022-01-22: 5 mg via ORAL

## 2022-01-22 MED ORDER — LATANOPROST 0.005 % OP SOLN
1.0000 [drp] | Freq: Every day | OPHTHALMIC | Status: DC
  Administered 2022-01-22: 1 [drp] via OPHTHALMIC
  Filled 2022-01-22: qty 2.5

## 2022-01-22 MED ORDER — PREDNISONE 5 MG PO TABS
5.0000 mg | ORAL_TABLET | Freq: Every day | ORAL | Status: DC
  Administered 2022-01-23 – 2022-01-25 (×3): 5 mg via ORAL
  Filled 2022-01-22 (×3): qty 1

## 2022-01-22 MED ORDER — AMLODIPINE BESYLATE 5 MG PO TABS
5.0000 mg | ORAL_TABLET | Freq: Once | ORAL | Status: DC
Start: 2022-01-22 — End: 2022-01-22
  Filled 2022-01-22: qty 1

## 2022-01-22 MED ORDER — MAGNESIUM OXIDE 400 MG PO TABS
400.0000 mg | ORAL_TABLET | Freq: Every day | ORAL | Status: DC
  Administered 2022-01-23 – 2022-01-30 (×8): 400 mg via ORAL
  Filled 2022-01-22 (×14): qty 1

## 2022-01-22 MED ORDER — ALUM & MAG HYDROXIDE-SIMETH 200-200-20 MG/5ML PO SUSP
30.0000 mL | ORAL | Status: DC | PRN
Start: 2022-01-22 — End: 2022-01-30

## 2022-01-22 MED ORDER — DORZOLAMIDE HCL 2 % OP SOLN
1.0000 [drp] | Freq: Two times a day (BID) | OPHTHALMIC | Status: DC
  Administered 2022-01-23 – 2022-01-30 (×16): 1 [drp] via OPHTHALMIC
  Filled 2022-01-22: qty 10

## 2022-01-22 MED ORDER — BRIMONIDINE TARTRATE-TIMOLOL 0.2-0.5 % OP SOLN
1.0000 [drp] | Freq: Two times a day (BID) | OPHTHALMIC | Status: DC
  Filled 2022-01-22: qty 5

## 2022-01-22 MED ORDER — VITAMIN D 25 MCG (1000 UNIT) PO TABS: 2000.0000 [IU] | ORAL_TABLET | Freq: Every day | ORAL | Status: DC

## 2022-01-22 MED ORDER — FLUOROMETHOLONE 0.1 % OP SUSP
1.0000 [drp] | Freq: Two times a day (BID) | OPHTHALMIC | Status: DC
  Filled 2022-01-22: qty 5

## 2022-01-22 MED ORDER — ARIPIPRAZOLE 2 MG PO TABS
2.0000 mg | ORAL_TABLET | Freq: Every morning | ORAL | Status: DC
Start: 2022-01-23 — End: 2022-01-23
  Administered 2022-01-23: 2 mg via ORAL
  Filled 2022-01-22: qty 1

## 2022-01-22 MED ORDER — LISINOPRIL 10 MG PO TABS
10.0000 mg | ORAL_TABLET | Freq: Once | ORAL | Status: DC
  Filled 2022-01-22: qty 1

## 2022-01-22 MED ORDER — METFORMIN HCL ER 500 MG PO TB24
500.0000 mg | ORAL_TABLET | Freq: Every day | ORAL | Status: DC
  Administered 2022-01-23 – 2022-01-30 (×8): 500 mg via ORAL
  Filled 2022-01-22 (×8): qty 1

## 2022-01-22 MED ORDER — LISINOPRIL 20 MG PO TABS
10.0000 mg | ORAL_TABLET | Freq: Every day | ORAL | Status: DC
  Administered 2022-01-23 – 2022-01-30 (×6): 10 mg via ORAL
  Filled 2022-01-22 (×8): qty 1

## 2022-01-22 MED ORDER — VITAMIN D 25 MCG (1000 UNIT) PO TABS
2000.0000 [IU] | ORAL_TABLET | Freq: Every day | ORAL | Status: DC
  Administered 2022-01-23 – 2022-01-30 (×8): 2000 [IU] via ORAL
  Filled 2022-01-22 (×8): qty 2

## 2022-01-22 NOTE — ED Notes (Signed)
Pt calmly continues to refuse to sign paperwork to go to geripsych. Will provide update to TTS/geripsych staff. ?

## 2022-01-22 NOTE — ED Notes (Signed)
VOL/ CONSULT  DONE  GOING  TO  [facility]  TONIGHT ?

## 2022-01-22 NOTE — Telephone Encounter (Signed)
MyChart message sent today recommending dose reduction by half for next month then can try stopping and see if symptoms improve. Would need follow up monitoring for any increase in inflammation.

## 2022-01-22 NOTE — ED Notes (Signed)
Update provided to provider KRM in relation to pt being unwilling to sign paperwork for geripsych.  ?

## 2022-01-22 NOTE — ED Notes (Signed)
Pt dressed out. Pt belongings include: ? ?White and black shirt ? ?Tan pants ? ?Black crocs ? ?2 ziploc bags with home meds including medicated eyedrops--med req to be followed up by pharm tech ? ? ? ? ?

## 2022-01-22 NOTE — BH Assessment (Signed)
Patient is to be admitted to [facility] tonight 01/22/22 after 8:00pm by Dr. Weber Cooks.  ?Attending Physician will be Dr.  Weber Cooks .   ?Patient has been assigned to room L29, by [facility] Charge Nurse, Stanton Kidney.   ? ?ER staff is aware of the admission: ?Lattie Haw, ER Secretary   ?Dr. Starleen Blue, ER MD  ?Radonna Ricker, Patient's Nurse  ?Seth Bake, Patient Access.  ?

## 2022-01-22 NOTE — Progress Notes (Deleted)
Office Visit Note  Patient: Mckenzie [name]             Date of Birth: 24-Feb-1946           MRN: [id number]             PCP: Loman Brooklyn, FNP Referring: Loman Brooklyn, FNP Visit Date: 01/22/2022   Subjective:  No chief complaint on file.   History of Present Illness: Mckenzie [name] is a 76 y.o. female here for follow up for GCA on prednisone 5 mg daily. She is suffering from worsened psychiatric symptoms with multiple delirium episodes and is now seeing Dr. Casimiro Needle for management. He is concerned about possible contribution of ongoing prednisone treatment to her recent symptoms. ***   Previous HPI 07/24/21 Mckenzie [name] is a 76 y.o. female here for follow up for GCA on prednisone 20 mg daily. She has had multiple events since her last visit she was hospitalized with altered mental status found to have a new stroke with significant hypertension.  She started treatment with Plavix and Actemra was discontinued due to concern for contributing to the high blood pressure.  The prednisone dose was increased back to 20 mg daily due to removal of her DMARDs.  She subsequently developed multiple new compression fractures in the thoracic vertebra provoked with minimal trauma even just routine forward bending.  She was evaluated by neurosurgery clinic they are concerned for whether her bone density will tolerate any reconstructive or stabilization procedures.  In the meantime she is taking very low-dose hydrocodone for pain control.  She continues experiencing extensive bruising particularly with a very large hematoma on the left arm at the site of an IV access.  Her mental status has also worsened with some episodes of hallucination or delirium thought to be related to her pain medication or else the illnesses and hospitalization.   Previous HPI  01/02/21  Mckenzie [name] is a 76 y.o. female here for giant cell arteritis. Symptoms seem to extend back to last year with developing severe  stiffness and aches in her upper and lower extremities with progressive decrease in mobility and function. She was hospitalized in December after fall with distal radius fracture. After that she continued overall decline and was admitted to the hospital again in March with hyponatremia affecting weakness and mental status. Workup revealed transudative pericardial effusion also descending thoracic aorta wall thickening and high inflammatory markers concerning for GCA. She underewnt bilateral temporal artery biopsies with pathology consistent with GCA. She started high dose steroids with slow tapering on prednisone and discharged home. Since that time she and her husband have noticed a very large improvement in her status mentally and physically. She is continuing the recommended therapy exercises at home although not in formal PT or home PT yet. Currently at '40mg'$  per day dose. She denies significant headache, jaw pain besides chronic TMJ, or visual changes. She reports ophthalmology follow up for her glaucoma noted actually decreased pressures on the prednisone. Cardiology follow up indicates partial reduction of effusion.    12/2020  Vit D 36.27  WBC 12.5  Plt 617    Biopsy from 12/15/20 was consistent with GCA on bilateral specimens   No Rheumatology ROS completed.   PMFS History:  Patient Active Problem List   Diagnosis Date Noted   Diabetes ([place]) 11/30/2021   Foley catheter in place prior to arrival 10/28/2021   Compression fracture of T12 vertebra ([place]) 08/10/2021   Hypokalemia 06/26/2021  History of CVA (cerebrovascular accident) 06/26/2021   Compression fracture of T1 vertebra ([facility]) 05/25/2021   Compression fracture of T4 vertebra ([facility]CC) 05/25/2021   Fracture of sacrum ([place]) 05/25/2021   Fracture of manubrium 05/25/2021   Depression 01/23/2021   High risk medication use 01/02/2021   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[place]) 12/27/2020   Abnormal MRI, cervical spine 12/27/2020   Steroid-induced  hyperglycemia 12/27/2020   Malnutrition of moderate degree 12/11/2020   Benign neoplasm of cecum    Benign neoplasm of descending colon    Hiatal hernia    Thrombocytosis 12/08/2020   Hypoalbuminemia 12/08/2020   Constipation 12/06/2020   Anemia of chronic disease 12/06/2020   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 11/14/2020   Adjustment disorder with mixed anxiety and depressed mood 10/10/2020   Distal radius fracture 10/05/2020   Proximal weakness of extremity 09/26/2020   Myalgia 09/26/2020   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   Essential hypertension 01/11/2014   Hyperlipidemia 01/11/2014    Past Medical History:  Diagnosis Date   Anemia    low iron   Arthritis    Depression    Diabetes ([place]) 11/30/2021   High cholesterol    Hypertension    Retinal micro-aneurysm of right eye    Stroke ([place])    2 or 3 mini strokes    Family History  Problem Relation Age of Onset   Hyperlipidemia Mother    Hypertension Mother    Diabetes Mother    COPD Father    Depression Sister    Suicidality Brother    Drug abuse Brother    Healthy Daughter    Healthy Daughter    Healthy Daughter    Colon cancer Neg Hx    Celiac disease Neg Hx    Inflammatory bowel disease Neg Hx    Past Surgical History:  Procedure Laterality Date   ARTERY BIOPSY Bilateral 12/15/2020   Procedure: BILATERAL TEMPORAL ARTERY BIOPSIES;  Surgeon: Angelia Mould, MD;  Location: [facility] OR;  Service: Vascular;  Laterality: Bilateral;   COLONOSCOPY WITH PROPOFOL N/A 12/09/2020   Procedure: COLONOSCOPY WITH PROPOFOL;  Surgeon: Gatha Mayer, MD;  Location: [facility] ENDOSCOPY;  Service: Endoscopy;  Laterality: N/A;   ESOPHAGOGASTRODUODENOSCOPY (EGD) WITH PROPOFOL N/A 12/09/2020   Procedure: ESOPHAGOGASTRODUODENOSCOPY (EGD) WITH PROPOFOL;  Surgeon: Gatha Mayer, MD;  Location: [place];  Service: Endoscopy;  Laterality: N/A;   EYE SURGERY     FINGER SURGERY Left    RING FINGER   IR THORACENTESIS ASP PLEURAL SPACE  W/IMG GUIDE  12/13/2020   OPEN REDUCTION INTERNAL FIXATION (ORIF) DISTAL RADIAL FRACTURE Left 10/05/2020   Procedure: OPEN REDUCTION INTERNAL FIXATION (ORIF) DISTAL RADIAL FRACTURE;  Surgeon: Shona Needles, MD;  Location: [place];  Service: Orthopedics;  Laterality: Left;  supraclavicular block   POLYPECTOMY  12/09/2020   Procedure: POLYPECTOMY;  Surgeon: Gatha Mayer, MD;  Location: [place];  Service: Endoscopy;;   SPINAL FUSION  08/10/2021   T10-L2   TUBAL LIGATION     Social History   Social History Narrative   Right handed   Caffeine use: coffee daily   Immunization History  Administered Date(s) Administered   PFIZER Comirnaty(Gray Top)Covid-19 Tri-Sucrose Vaccine 11/11/2019, 12/06/2019, 10/31/2020   PFIZER(Purple Top)SARS-COV-2 Vaccination 11/11/2019, 12/06/2019, 10/31/2020   Pneumococcal Conjugate-13 08/21/2018   Pneumococcal Polysaccharide-23 07/07/2012   Td 05/31/2011   Tdap 10/07/2010, 05/31/2011     Objective: Vital Signs: There were no vitals taken for this visit.   Physical Exam   Musculoskeletal Exam: ***  CDAI Exam: CDAI Score: -- Patient Global: --; Provider Global: -- Swollen: --; Tender: -- Joint Exam 01/22/2022   No joint exam has been documented for this visit   There is currently no information documented on the homunculus. Go to the Rheumatology activity and complete the homunculus joint exam.  Investigation: No additional findings.  Imaging: No results found.  Recent Labs: Lab Results  Component Value Date   WBC 10.4 11/29/2021   HGB 12.0 11/29/2021   PLT 511 (H) 11/29/2021   NA 142 11/29/2021   K 4.8 11/29/2021   CL 102 11/29/2021   CO2 25 11/29/2021   GLUCOSE 138 (H) 11/29/2021   BUN 16 11/29/2021   CREATININE 0.72 11/29/2021   BILITOT <0.2 11/29/2021   ALKPHOS 129 (H) 11/29/2021   AST 15 11/29/2021   ALT 15 11/29/2021   PROT 7.4 11/29/2021   ALBUMIN 4.2 11/29/2021   CALCIUM 9.9 11/29/2021   GFRAA 120 12/01/2020    QFTBGOLDPLUS NEGATIVE 01/02/2021    Speciality Comments: Actemra started 03/12/21 (receives through [facility] patient assistance)  Procedures:  No procedures performed Allergies: Aldomet [methyldopa], Fosamax [alendronate sodium], Plavix [clopidogrel], Acetazolamide, and Buspar [buspirone]   Assessment / Plan:     Visit Diagnoses: No diagnosis found.  ***  Orders: No orders of the defined types were placed in this encounter.  No orders of the defined types were placed in this encounter.    Follow-Up Instructions: No follow-ups on file.   Collier Salina, MD  Note - This record has been created using [employer].  Chart creation errors have been sought, but may not always  have been located. Such creation errors do not reflect on  the standard of medical care.

## 2022-01-22 NOTE — ED Notes (Signed)
Sent secure chat message to provider KRM as is in another pt's room currently and pt's BP remains 190s/80s. Awaiting reply.  ?

## 2022-01-22 NOTE — ED Notes (Signed)
Verbal from provider [place] to IVC pt if pt unwilling to sign geripsych paperwork.  ?

## 2022-01-22 NOTE — ED Notes (Signed)
Pt reports she uses a walker at home to get around and that she usually has a family member or care taker with her.  ?

## 2022-01-22 NOTE — ED Notes (Signed)
Pharm messaged about missing doses of eye drops. Will provide to pt once received from pharm.  ?

## 2022-01-22 NOTE — ED Notes (Signed)
Meal tray given. Pt informed of need for urine sample following completion of meal. ?

## 2022-01-22 NOTE — Telephone Encounter (Signed)
Patient is scheduled with Dr. Casimiro Needle for 01/31/22 at 1:30 pm.  ?

## 2022-01-22 NOTE — ED Triage Notes (Signed)
Pt to ED POV with daughter and husband. ? ?Per daughter, pt has been hallucinating (auditory and visual) for several months (since October 22) but this all started when pt had been hospitalized for medical reasons and had delirium and confusion for UTI. ? ?Pt has been under care of psychiatrist  [facility]) due to hallucinations who spoke with Dr Weber Cooks about obtaining medical clearance so pt can be admitted to [facility]. ? ?No known hx dementia. ? ?Pt appears calm and cooperative at this time. Pt states she has felt like harming self at times but "I really didn't mean it when I said it". Daughter states she has been talking about cutting own wrists. Husband states her father and brother both had SI and brother actually hung self. ? ?Pt at this time just asked husband who he was. Pt also teared up at this time and stated that "my husband has been running on me with a 76 year old". Pt does appear confused and delusional. ? ?Daughter states pt has been evaluated several times for dementia and they have been told she does not have dementia, that this is psychiatric and that it may have to do with medications pt has been on including prednisone. ?

## 2022-01-22 NOTE — ED Notes (Signed)
Pt up to restroom with NT; pt using walker.  ?

## 2022-01-22 NOTE — Consult Note (Signed)
[facility] Face-to-Face Psychiatry Consult  ? ?Reason for Consult:  hallucinations ?Referring Physician:  Starleen Blue ?Patient Identification: Mckenzie [name] ?MRN:  [id number] ?Principal Diagnosis: Visual hallucination ?Diagnosis:  Principal Problem: ?  Visual hallucination ? ? ?Total Time spent with patient: 15 minutes ? ?Subjective: "My husband says I am having hallucinations." ?Mckenzie [name] is a 76 y.o. female patient admitted with hallucinations. ? ?HPI: Patient was referred to the ED by her outpatient provider due to worsening hallucinations.  On approach in the ED, patient is calm and cooperative.  She is pleasant.  She does state that she thinks she is having visual hallucinations, but consisting of dogs, cats, and women.  She denies any auditory hallucinations at this time.  She is quite sleepy at this time.  Patient denies any suicidal thoughts.  She states that she wants to live for her grandchildren and tells me their ages.  She states that she gets a lot of joy from them.  Patient has been accepted to the geriatric psychiatry unit. ? ?Past Psychiatric History:  ? ?Risk to Self:   ?Risk to Others:   ?Prior Inpatient Therapy:   ?Prior Outpatient Therapy:   ? ?Past Medical History:  ?Past Medical History:  ?Diagnosis Date  ? Anemia   ? low iron  ? Arthritis   ? Depression   ? Diabetes ([place]) 11/30/2021  ? High cholesterol   ? Hypertension   ? Retinal micro-aneurysm of right eye   ? Stroke [facility])   ? 2 or 3 mini strokes  ?  ?Past Surgical History:  ?Procedure Laterality Date  ? ARTERY BIOPSY Bilateral 12/15/2020  ? Procedure: BILATERAL TEMPORAL ARTERY BIOPSIES;  Surgeon: Angelia Mould, MD;  Location: [facility] OR;  Service: Vascular;  Laterality: Bilateral;  ? COLONOSCOPY WITH PROPOFOL N/A 12/09/2020  ? Procedure: COLONOSCOPY WITH PROPOFOL;  Surgeon: Gatha Mayer, MD;  Location: [facility] ENDOSCOPY;  Service: Endoscopy;  Laterality: N/A;  ? ESOPHAGOGASTRODUODENOSCOPY (EGD) WITH PROPOFOL N/A 12/09/2020  ? Procedure:  ESOPHAGOGASTRODUODENOSCOPY (EGD) WITH PROPOFOL;  Surgeon: Gatha Mayer, MD;  Location: [facility] ENDOSCOPY;  Service: Endoscopy;  Laterality: N/A;  ? EYE SURGERY    ? FINGER SURGERY Left   ? RING FINGER  ? IR THORACENTESIS ASP PLEURAL SPACE W/IMG GUIDE  12/13/2020  ? OPEN REDUCTION INTERNAL FIXATION (ORIF) DISTAL RADIAL FRACTURE Left 10/05/2020  ? Procedure: OPEN REDUCTION INTERNAL FIXATION (ORIF) DISTAL RADIAL FRACTURE;  Surgeon: Shona Needles, MD;  Location: [place];  Service: Orthopedics;  Laterality: Left;  supraclavicular block  ? POLYPECTOMY  12/09/2020  ? Procedure: POLYPECTOMY;  Surgeon: Gatha Mayer, MD;  Location: [facility] ENDOSCOPY;  Service: Endoscopy;;  ? SPINAL FUSION  08/10/2021  ? T10-L2  ? TUBAL LIGATION    ? ?Family History:  ?Family History  ?Problem Relation Age of Onset  ? Hyperlipidemia Mother   ? Hypertension Mother   ? Diabetes Mother   ? COPD Father   ? Depression Sister   ? Suicidality Brother   ? Drug abuse Brother   ? Healthy Daughter   ? Healthy Daughter   ? Healthy Daughter   ? Colon cancer Neg Hx   ? Celiac disease Neg Hx   ? Inflammatory bowel disease Neg Hx   ? ?Family Psychiatric  History:  ?Social History:  ?Social History  ? ?Substance and Sexual Activity  ?Alcohol Use No  ?   ?Social History  ? ?Substance and Sexual Activity  ?Drug Use No  ?  ?Social History  ? ?  Socioeconomic History  ? Marital status: Married  ?  Spouse name: Not on file  ? Number of children: 3  ? Years of education: 44  ? Highest education level: Not on file  ?Occupational History  ? Not on file  ?Tobacco Use  ? Smoking status: Former  ?  Packs/day: 0.10  ?  Years: 1.00  ?  Pack years: 0.10  ?  Types: Cigarettes  ?  Quit date: 44  ?  Years since quitting: 41.3  ? Smokeless tobacco: Never  ? Tobacco comments:  ?  for a short period  ?Vaping Use  ? Vaping Use: Never used  ?Substance and Sexual Activity  ? Alcohol use: No  ? Drug use: No  ? Sexual activity: Not on file  ?Other Topics Concern  ? Not on file  ?Social  History Narrative  ? Right handed  ? Caffeine use: coffee daily  ? ?Social Determinants of Health  ? ?Financial Resource Strain: Not on file  ?Food Insecurity: Not on file  ?Transportation Needs: Not on file  ?Physical Activity: Not on file  ?Stress: Not on file  ?Social Connections: Not on file  ? ?Additional Social History: ?  ? ?Allergies:   ?Allergies  ?Allergen Reactions  ? Aldomet [Methyldopa] Other (See Comments)  ?  Flu-like symptoms  ? Fosamax [Alendronate Sodium] Other (See Comments)  ?  Weakness/fatigue   ? Plavix [Clopidogrel] Other (See Comments)  ?  Arm swelled, bleeding  ? Acetazolamide Rash  ? Buspar [Buspirone] Palpitations  ? ? ?Labs:  ?Results for orders placed or performed during the hospital encounter of 01/22/22 (from the past 48 hour(s))  ?Comprehensive metabolic panel     Status: Abnormal  ? Collection Time: 01/22/22  2:34 PM  ?Result Value Ref Range  ? Sodium 140 135 - 145 mmol/L  ? Potassium 3.3 (L) 3.5 - 5.1 mmol/L  ? Chloride 104 98 - 111 mmol/L  ? CO2 27 22 - 32 mmol/L  ? Glucose, Bld 141 (H) 70 - 99 mg/dL  ?  Comment: Glucose reference range applies only to samples taken after fasting for at least 8 hours.  ? BUN 16 8 - 23 mg/dL  ? Creatinine, Ser 0.72 0.44 - 1.00 mg/dL  ? Calcium 9.7 8.9 - 10.3 mg/dL  ? Total Protein 8.3 (H) 6.5 - 8.1 g/dL  ? Albumin 3.7 3.5 - 5.0 g/dL  ? AST 20 15 - 41 U/L  ? ALT 19 0 - 44 U/L  ? Alkaline Phosphatase 113 38 - 126 U/L  ? Total Bilirubin 0.5 0.3 - 1.2 mg/dL  ? GFR, Estimated >60 >60 mL/min  ?  Comment: (NOTE) ?Calculated using the CKD-EPI Creatinine Equation (2021) ?  ? Anion gap 9 5 - 15  ?  Comment: Performed at [facility], [address], [place], [place] [address]  ?Ethanol     Status: None  ? Collection Time: 01/22/22  2:34 PM  ?Result Value Ref Range  ? Alcohol, Ethyl (B) <10 <10 mg/dL  ?  Comment: (NOTE) ?Lowest detectable limit for serum alcohol is 10 mg/dL. ? ?For medical purposes only. ?Performed at [facility], [place], ?[place] [address] ?  ?Salicylate level     Status: Abnormal  ? Collection Time: 01/22/22  2:34 PM  ?Result Value Ref Range  ? Salicylate Lvl <7.3 (L) 7.0 - 30.0 mg/dL  ?  Comment: Performed at [facility], [address], [place], [place] [address]  ?  Acetaminophen level     Status: Abnormal  ? Collection Time: 01/22/22  2:34 PM  ?Result Value Ref Range  ? Acetaminophen (Tylenol), Serum <10 (L) 10 - 30 ug/mL  ?  Comment: (NOTE) ?Therapeutic concentrations vary significantly. A range of 10-30 ug/mL  ?may be an effective concentration for many patients. However, some  ?are best treated at concentrations outside of this range. ?Acetaminophen concentrations >150 ug/mL at 4 hours after ingestion  ?and >50 ug/mL at 12 hours after ingestion are often associated with  ?toxic reactions. ? ?Performed at [facility], [place], ?[place] [address] ?  ?cbc     Status: Abnormal  ? Collection Time: 01/22/22  2:34 PM  ?Result Value Ref Range  ? WBC 8.5 4.0 - 10.5 K/uL  ? RBC 4.78 3.87 - 5.11 MIL/uL  ? Hemoglobin 12.5 12.0 - 15.0 g/dL  ? HCT 40.5 36.0 - 46.0 %  ? MCV 84.7 80.0 - 100.0 fL  ? MCH 26.2 26.0 - 34.0 pg  ? MCHC 30.9 30.0 - 36.0 g/dL  ? RDW 16.8 (H) 11.5 - 15.5 %  ? Platelets 470 (H) 150 - 400 K/uL  ? nRBC 0.0 0.0 - 0.2 %  ?  Comment: Performed at [facility], [address] [place], [place] [address]  ?Resp Panel by RT-PCR (Flu A&B, Covid) Nasopharyngeal Swab     Status: None  ? Collection Time: 01/22/22  2:59 PM  ? Specimen: Nasopharyngeal Swab; Nasopharyngeal(NP) swabs in vial transport medium  ?Result Value Ref Range  ? SARS Coronavirus 2 by RT PCR NEGATIVE NEGATIVE  ?  Comment: (NOTE) ?SARS-CoV-2 target nucleic acids are NOT DETECTED. ? ?The SARS-CoV-2 RNA is generally detectable in upper respiratory ?specimens during the acute phase of infection. The lowest ?concentration of SARS-CoV-2 viral copies this assay can detect is ?138 copies/mL. A negative  result does not preclude SARS-Cov-2 ?infection and should not be used as the sole basis for treatment or ?other patient management decisions. A negative result may occur with  ?improper specimen collection/

## 2022-01-22 NOTE — Telephone Encounter (Signed)
Patients daughter [name] called the office to cancel Mckenzie [name]'s appointment with Dr. Benjamine Mola stating the patients psychiatrist is trying to hospitalize [name] today. [name] wants to know if she can decrease her mothers prednisone or wait until she is seen again in office. She states a MyChart message response is fine. ?

## 2022-01-22 NOTE — ED Notes (Signed)
Pt. Received lunch and water to drink. ?

## 2022-01-22 NOTE — BH Assessment (Signed)
Writer spoke with patient's daughter Mirza Kidney [phone]. [name] wanted to confirm that [facility] has a Archivist. Writer assured [name] of geri psych unit and patient will be admitted later this evening. Writer informed daughter that she will give her a call when a bed becomes available.[name] verbalized understanding.  ?

## 2022-01-22 NOTE — ED Provider Notes (Signed)
? ?[facility] ?Provider Note ? ? ? Event Date/Time  ? First MD Initiated Contact with Patient 01/22/22 1458   ?  (approximate) ? ? ?History  ? ?Suicidal ? ? ?HPI ? ?Mckenzie [name] is a 76 y.o. female   ? ?Per the patient's daughter patient has been hallucinating with both auditory and visual hallucination since October 2022, started after she was hospitalized for UTI.  Patient has a psychiatrist with Dr. Weber Cooks about getting the patient admitted for geriatric psych. ? ?Patient tells me she is here because her husband thinks she is psychotic.  She does endorse both auditory and visual hallucinations.  She sees animals in the backyard that are not there and also hears voices.  Also tells me that her husband is currently in a relationship with a 54 year old which makes her quite upset.  He denies any suicidal ideation or homicidal ideation.  But per triage note her daughter has said that she has been talking about cutting her own wrist. ? ?She has no other medical complaints at this time. ? ?  ? ?Past Medical History:  ?Diagnosis Date  ? Anemia   ? low iron  ? Arthritis   ? Depression   ? Diabetes ([place]) 11/30/2021  ? High cholesterol   ? Hypertension   ? Retinal micro-aneurysm of right eye   ? Stroke [facility])   ? 2 or 3 mini strokes  ? ? ?Patient Active Problem List  ? Diagnosis Date Noted  ? Diabetes ([place]) 11/30/2021  ? Foley catheter in place prior to arrival 10/28/2021  ? Compression fracture of T12 vertebra ([place]) 08/10/2021  ? Hypokalemia 06/26/2021  ? History of CVA (cerebrovascular accident) 06/26/2021  ? Compression fracture of T1 vertebra ([place]) 05/25/2021  ? Compression fracture of T4 vertebra ([place]) 05/25/2021  ? Fracture of sacrum ([place]) 05/25/2021  ? Fracture of manubrium 05/25/2021  ? Depression 01/23/2021  ? High risk medication use 01/02/2021  ? 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[place]) 12/27/2020  ? Abnormal MRI, cervical spine 12/27/2020  ? Steroid-induced hyperglycemia 12/27/2020  ? Malnutrition  of moderate degree 12/11/2020  ? Benign neoplasm of cecum   ? Benign neoplasm of descending colon   ? Hiatal hernia   ? Thrombocytosis 12/08/2020  ? Hypoalbuminemia 12/08/2020  ? Constipation 12/06/2020  ? Anemia of chronic disease 12/06/2020  ? 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 11/14/2020  ? Adjustment disorder with mixed anxiety and depressed mood 10/10/2020  ? Distal radius fracture 10/05/2020  ? Proximal weakness of extremity 09/26/2020  ? Myalgia 09/26/2020  ? 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 Essential hypertension 01/11/2014  ? Hyperlipidemia 01/11/2014  ? ? ? ?Physical Exam  ?Triage Vital Signs: ?ED Triage Vitals  ?Enc Vitals Group  ?   BP 01/22/22 1435 (!) 154/85  ?   Pulse Rate 01/22/22 1435 86  ?   Resp 01/22/22 1435 16  ?   Temp 01/22/22 1435 97.9 ?F (36.6 ?C)  ?   Temp Source 01/22/22 1435 Oral  ?   SpO2 01/22/22 1435 94 %  ?   Weight 01/22/22 1427 118 lb (53.5 kg)  ?   Height 01/22/22 1427 '5\' 4"'$  (1.626 m)  ?   Head Circumference --   ?   Peak Flow --   ?   Pain Score --   ?   Pain Loc --   ?   Pain Edu? --   ?   Excl. in [place]? --   ? ? ?Most recent vital signs: ?Vitals:  ?  01/22/22 1435  ?BP: (!) 154/85  ?Pulse: 86  ?Resp: 16  ?Temp: 97.9 ?F (36.6 ?C)  ?SpO2: 94%  ? ? ? ?General: Awake, no distress. Pt eating, appears comfortable ?CV:  Good peripheral perfusion.  ?Resp:  Normal effort. No increased WOB ?Abd:  No distention.  ?Neuro:             Pt is hard of hearing Awake, Alert, Oriented x 3  ?Other:  Calm and cooperative, flat affect, no SI or HI ? ? ?ED Results / Procedures / Treatments  ?Labs ?(all labs ordered are listed, but only abnormal results are displayed) ?Labs Reviewed  ?COMPREHENSIVE METABOLIC PANEL - Abnormal; Notable for the following components:  ?    Result Value  ? Potassium 3.3 (*)   ? Glucose, Bld 141 (*)   ? Total Protein 8.3 (*)   ? All other components within normal limits  ?SALICYLATE LEVEL - Abnormal; Notable for the following components:  ? Salicylate Lvl <2.7 (*)   ? All other  components within normal limits  ?ACETAMINOPHEN LEVEL - Abnormal; Notable for the following components:  ? Acetaminophen (Tylenol), Serum <10 (*)   ? All other components within normal limits  ?CBC - Abnormal; Notable for the following components:  ? RDW 16.8 (*)   ? Platelets 470 (*)   ? All other components within normal limits  ?RESP PANEL BY RT-PCR (FLU A&B, COVID) [id number]  ?ETHANOL  ?URINE DRUG SCREEN, QUALITATIVE ([facility] ONLY)  ? ? ? ?EKG ? ? ? ? ?RADIOLOGY ? ? ? ?PROCEDURES: ? ?Critical Care performed: No ? ?Procedures ? ?The patient is on the cardiac monitor to evaluate for evidence of arrhythmia and/or significant heart rate changes. ? ? ?MEDICATIONS ORDERED IN ED: ?Medications  ?amLODipine (NORVASC) tablet 5 mg (has no administration in time range)  ?ARIPiprazole (ABILIFY) tablet 2 mg (has no administration in time range)  ?ketorolac (ACULAR) 0.5 % ophthalmic solution 1 drop (has no administration in time range)  ?buPROPion (WELLBUTRIN XL) 24 hr tablet 150 mg (has no administration in time range)  ?brimonidine-timolol (COMBIGAN) 0.2-0.5 % ophthalmic solution 1 drop (has no administration in time range)  ?dorzolamide (TRUSOPT) 2 % ophthalmic solution 1 drop (has no administration in time range)  ?Vitamin D2 TABS 1 tablet (has no administration in time range)  ?fluorometholone (FML) 0.1 % ophthalmic suspension 1 drop (has no administration in time range)  ?latanoprost (XALATAN) 0.005 % ophthalmic solution 1 drop (has no administration in time range)  ?lisinopril (ZESTRIL) tablet 10 mg (has no administration in time range)  ?LORazepam (ATIVAN) tablet 0.5 mg (has no administration in time range)  ?magnesium oxide (MAG-OX) tablet 400 mg (has no administration in time range)  ?metFORMIN (GLUCOPHAGE-XR) 24 hr tablet 500 mg (has no administration in time range)  ?multivitamin with minerals tablet 1 tablet (has no administration in time range)  ?predniSONE (DELTASONE) tablet 5 mg (has no administration in time range)   ? ? ? ?IMPRESSION / MDM / ASSESSMENT AND PLAN / ED COURSE  ?I reviewed the triage vital signs and the nursing notes. ?             ?               ? ?Differential diagnosis includes, but is not limited to, dementia with behavioral disturbance, primary psychiatric disorder, depression with psychotic features ? ?Patient is a 76 year old female with past medical history of giant cell arteritis on prednisone low-dose daily, anxiety and depression who presents  for admission for geriatric psych.  Patient has had issues with hallucinations and delusions and paranoia for several months now that is all seem to start after she was hospitalized for UTI after wrist surgery.  She had been on high doses of prednisone but this has been tapered down and is only on 5 mg daily.  Sees neurology and psychiatry.  Seems today that things have not really been acute but continued to worsen.  She has both auditory and visual hallucinations.  She has paranoid delusions thinks that her husband is dating a 16 year old.  She is calm and cooperative here in the ED without new medical complaints.  During CMP largely within normal limits as a CBC Tylenol and salicylate levels are negative.  Blood pressure is mildly elevated.  Patient does not appear altered.  Given the chronicity I have low suspicion for acute medical process underlying her behavioral changes.  Will consult psychiatry for admission for geriatric psych. ? ?The patient has been placed in psychiatric observation due to the need to provide a safe environment for the patient while obtaining psychiatric consultation and evaluation, as well as ongoing medical and medication management to treat the patient's condition.  The patient has not been placed under full IVC at this time.  ? ?  ? ? ?FINAL CLINICAL IMPRESSION(S) / ED DIAGNOSES  ? ?Final diagnoses:  ?Hallucinations  ? ? ? ?Rx / DC Orders  ? ?ED Discharge Orders   ? ? None  ? ?  ? ? ? ?Note:  This document was prepared using Dragon  voice recognition software and may include unintentional dictation errors. ?  ?Rada Hay, MD ?01/22/22 1554 ? ?

## 2022-01-23 ENCOUNTER — Encounter: Payer: Self-pay | Admitting: Psychiatry

## 2022-01-23 DIAGNOSIS — R443 Hallucinations, unspecified: Secondary | ICD-10-CM | POA: Diagnosis not present

## 2022-01-23 MED ORDER — HALOPERIDOL 1 MG PO TABS
0.5000 mg | ORAL_TABLET | ORAL | Status: DC
  Administered 2022-01-23 – 2022-01-30 (×14): 0.5 mg via ORAL
  Filled 2022-01-23 (×15): qty 1

## 2022-01-23 MED ORDER — LISINOPRIL 20 MG PO TABS
10.0000 mg | ORAL_TABLET | Freq: Once | ORAL | Status: AC
  Administered 2022-01-23: 10 mg via ORAL
  Filled 2022-01-23: qty 1

## 2022-01-23 MED ORDER — KETOROLAC TROMETHAMINE 0.5 % OP SOLN
1.0000 [drp] | Freq: Every day | OPHTHALMIC | Status: DC
  Administered 2022-01-23 – 2022-01-29 (×7): 1 [drp] via OPHTHALMIC
  Filled 2022-01-23: qty 3

## 2022-01-23 MED ORDER — MIRTAZAPINE 15 MG PO TABS
15.0000 mg | ORAL_TABLET | Freq: Every day | ORAL | Status: DC
  Filled 2022-01-23: qty 1

## 2022-01-23 NOTE — H&P (Signed)
Psychiatric Admission Assessment Adult ? ?Patient Identification: Mckenzie [name] ?MRN:  [id number] ?Date of Evaluation:  01/23/2022 ?Chief Complaint:  Hallucinations [R44.3] ?Principal Diagnosis: Hallucinations ?Diagnosis:  Principal Problem: ?  Hallucinations ? ?History of Present Illness: Mckenzie [name] is a 76 year old white female who was referred for geriatric psychiatric admission by Dr. Casimiro Needle, psychiatry.  She had recently been worked up by a neurologist for auditory and visual hallucinations.  She does have a history of multiple CVAs, giant cell arteritis, visual and hearing impairment, recent back surgery with cognitive decline afterward.  She has been on prednisone 5 mg/day for her arthritis.  She has been on Lexapro, Seroquel, and Zyprexa.  Her Lexapro was not restarted on admission nor was her Seroquel.  She is currently on Abilify 2 mg/day.  She tells me that her husband is having an affair with a 32 year old which according to records is not true.  She has 3 grown daughters and currently resides in [place] with her husband.  She has been treated by Dr. Modesta Messing in the past for depression.  I do not believe that she has been psychiatrically admitted before.  She denies any suicidal ideation.  She does admit to being depressed because her husband is having an affair.  She is very labile and hard of hearing.  She laughs and cries during the interview. ? ?PER INITIAL INTAKE: ?Mckenzie [name] is a 76 y.o. female   ?  ?Per the patient's daughter patient has been hallucinating with both auditory and visual hallucination since October 2022, started after she was hospitalized for UTI.  Patient has a psychiatrist with Dr. Weber Cooks about getting the patient admitted for geriatric psych. ?  ?Patient tells me she is here because her husband thinks she is psychotic.  She does endorse both auditory and visual hallucinations.  She sees animals in the backyard that are not there and also hears voices.  Also tells me  that her husband is currently in a relationship with a 60 year old which makes her quite upset.  He denies any suicidal ideation or homicidal ideation.  But per triage note her daughter has said that she has been talking about cutting her own wrist. ? ?Associated Signs/Symptoms: ?Depression Symptoms:  depressed mood, ?anhedonia, ?impaired memory, ?anxiety, ?Duration of Depression Symptoms: No data recorded ?(Hypo) Manic Symptoms:  Delusions, ?Hallucinations, ?Irritable Mood, ?Anxiety Symptoms:  Excessive Worry, ?Psychotic Symptoms:  Delusions, ?Hallucinations: Auditory ?Visual ?PTSD Symptoms: ?Negative ?Total Time spent with patient: 1 hour ? ?Past Psychiatric History: Treated by Dr. Modesta Messing last year for depression. ? ?Is the patient at risk to self? Yes.    ?Has the patient been a risk to self in the past 6 months? Yes.    ?Has the patient been a risk to self within the distant past? No.  ?Is the patient a risk to others? No.  ?Has the patient been a risk to others in the past 6 months? No.  ?Has the patient been a risk to others within the distant past? No.  ? ?Prior Inpatient Therapy:   ?Prior Outpatient Therapy:   ? ?Alcohol Screening: 1. How often do you have a drink containing alcohol?: Never ?2. How many drinks containing alcohol do you have on a typical day when you are drinking?: 1 or 2 ?3. How often do you have six or more drinks on one occasion?: Never ?AUDIT-C Score: 0 ?4. How often during the last year have you found that you were not able to stop drinking once you had started?:  Never ?5. How often during the last year have you failed to do what was normally expected from you because of drinking?: Never ?6. How often during the last year have you needed a first drink in the morning to get yourself going after a heavy drinking session?: Never ?7. How often during the last year have you had a feeling of guilt of remorse after drinking?: Never ?8. How often during the last year have you been unable to  remember what happened the night before because you had been drinking?: Never ?9. Have you or someone else been injured as a result of your drinking?: No ?10. Has a relative or friend or a doctor or another health worker been concerned about your drinking or suggested you cut down?: No ?Alcohol Use Disorder Identification Test Final Score (AUDIT): 0 ?Substance Abuse History in the last 12 months:  No. ?Consequences of Substance Abuse: ?NA ?Previous Psychotropic Medications: Yes  ?Psychological Evaluations: No  ?Past Medical History:  ?Past Medical History:  ?Diagnosis Date  ? Anemia   ? low iron  ? Arthritis   ? Depression   ? Diabetes ([place]) 11/30/2021  ? High cholesterol   ? Hypertension   ? Retinal micro-aneurysm of right eye   ? Stroke [facility])   ? 2 or 3 mini strokes  ?  ?Past Surgical History:  ?Procedure Laterality Date  ? ARTERY BIOPSY Bilateral 12/15/2020  ? Procedure: BILATERAL TEMPORAL ARTERY BIOPSIES;  Surgeon: Angelia Mould, MD;  Location: [facility] OR;  Service: Vascular;  Laterality: Bilateral;  ? COLONOSCOPY WITH PROPOFOL N/A 12/09/2020  ? Procedure: COLONOSCOPY WITH PROPOFOL;  Surgeon: Gatha Mayer, MD;  Location: [facility] ENDOSCOPY;  Service: Endoscopy;  Laterality: N/A;  ? ESOPHAGOGASTRODUODENOSCOPY (EGD) WITH PROPOFOL N/A 12/09/2020  ? Procedure: ESOPHAGOGASTRODUODENOSCOPY (EGD) WITH PROPOFOL;  Surgeon: Gatha Mayer, MD;  Location: [facility] ENDOSCOPY;  Service: Endoscopy;  Laterality: N/A;  ? EYE SURGERY    ? FINGER SURGERY Left   ? RING FINGER  ? IR THORACENTESIS ASP PLEURAL SPACE W/IMG GUIDE  12/13/2020  ? OPEN REDUCTION INTERNAL FIXATION (ORIF) DISTAL RADIAL FRACTURE Left 10/05/2020  ? Procedure: OPEN REDUCTION INTERNAL FIXATION (ORIF) DISTAL RADIAL FRACTURE;  Surgeon: Shona Needles, MD;  Location: [facility];  Service: Orthopedics;  Laterality: Left;  supraclavicular block  ? POLYPECTOMY  12/09/2020  ? Procedure: POLYPECTOMY;  Surgeon: Gatha Mayer, MD;  Location: [facility] ENDOSCOPY;  Service: Endoscopy;;   ? SPINAL FUSION  08/10/2021  ? T10-L2  ? TUBAL LIGATION    ? ?Family History:  ?Family History  ?Problem Relation Age of Onset  ? Hyperlipidemia Mother   ? Hypertension Mother   ? Diabetes Mother   ? COPD Father   ? Depression Sister   ? Suicidality Brother   ? Drug abuse Brother   ? Healthy Daughter   ? Healthy Daughter   ? Healthy Daughter   ? Colon cancer Neg Hx   ? Celiac disease Neg Hx   ? Inflammatory bowel disease Neg Hx   ? ?Family Psychiatric  History: Unremarkable ?Tobacco Screening:   ?Social History:  ?Social History  ? ?Substance and Sexual Activity  ?Alcohol Use No  ?   ?Social History  ? ?Substance and Sexual Activity  ?Drug Use No  ?  ?Additional Social History: ?  ?   ?  ?  ?  ?  ?  ?  ?  ?  ?  ?  ? ?Allergies:   ?Allergies  ?Allergen Reactions  ?  Aldomet [Methyldopa] Other (See Comments)  ?  Flu-like symptoms  ? Fosamax [Alendronate Sodium] Other (See Comments)  ?  Weakness/fatigue   ? Plavix [Clopidogrel] Other (See Comments)  ?  Arm swelled, bleeding  ? Acetazolamide Rash  ? Buspar [Buspirone] Palpitations  ? ?Lab Results:  ?Results for orders placed or performed during the hospital encounter of 01/22/22 (from the past 48 hour(s))  ?Comprehensive metabolic panel     Status: Abnormal  ? Collection Time: 01/22/22  2:34 PM  ?Result Value Ref Range  ? Sodium 140 135 - 145 mmol/L  ? Potassium 3.3 (L) 3.5 - 5.1 mmol/L  ? Chloride 104 98 - 111 mmol/L  ? CO2 27 22 - 32 mmol/L  ? Glucose, Bld 141 (H) 70 - 99 mg/dL  ?  Comment: Glucose reference range applies only to samples taken after fasting for at least 8 hours.  ? BUN 16 8 - 23 mg/dL  ? Creatinine, Ser 0.72 0.44 - 1.00 mg/dL  ? Calcium 9.7 8.9 - 10.3 mg/dL  ? Total Protein 8.3 (H) 6.5 - 8.1 g/dL  ? Albumin 3.7 3.5 - 5.0 g/dL  ? AST 20 15 - 41 U/L  ? ALT 19 0 - 44 U/L  ? Alkaline Phosphatase 113 38 - 126 U/L  ? Total Bilirubin 0.5 0.3 - 1.2 mg/dL  ? GFR, Estimated >60 >60 mL/min  ?  Comment: (NOTE) ?Calculated using the CKD-EPI Creatinine Equation  (2021) ?  ? Anion gap 9 5 - 15  ?  Comment: Performed at [facility], [address] [place], [place] [address]  ?Ethanol     Status: None  ? Collection Time: 01/22/22  2:34 PM  ?Result Value Ref Ra

## 2022-01-23 NOTE — Plan of Care (Signed)
Patient arrived on unit tonight.  Not enough time to progress with goals. ? ?Problem: Education: ?Goal: Knowledge of [facility] Education information/materials will improve ?Outcome: Not Progressing ?Goal: Emotional status will improve ?Outcome: Not Progressing ?Goal: Mental status will improve ?Outcome: Not Progressing ?Goal: Verbalization of understanding the information provided will improve ?Outcome: Not Progressing ?  ?Problem: Activity: ?Goal: Interest or engagement in activities will improve ?Outcome: Not Progressing ?Goal: Sleeping patterns will improve ?Outcome: Not Progressing ?  ?Problem: Coping: ?Goal: Ability to verbalize frustrations and anger appropriately will improve ?Outcome: Not Progressing ?Goal: Ability to demonstrate self-control will improve ?Outcome: Not Progressing ?  ?Problem: Health Behavior/Discharge Planning: ?Goal: Identification of resources available to assist in meeting health care needs will improve ?Outcome: Not Progressing ?Goal: Compliance with treatment plan for underlying cause of condition will improve ?Outcome: Not Progressing ?  ?Problem: Physical Regulation: ?Goal: Ability to maintain clinical measurements within normal limits will improve ?Outcome: Not Progressing ?  ?Problem: Safety: ?Goal: Periods of time without injury will increase ?Outcome: Not Progressing ?  ?

## 2022-01-23 NOTE — Plan of Care (Signed)
Patient is alert and oriented X3 calm and cooperative during assessment. Denies pain, anxiety and depression. Denies SI, HI, and AVH. Report sleeping good last night. Assisted out of bed to recliner with one person physical assist. Ate breakfast in the day room among peers with good appetite. Compliant with all due medications. Remain safe on the unit with Q15 minute safety check. ? ?Problem: Education: ?Goal: Knowledge of [place] [facility] Education information/materials will improve ?Outcome: Progressing ?Goal: Emotional status will improve ?Outcome: Progressing ?Goal: Mental status will improve ?Outcome: Progressing ?Goal: Verbalization of understanding the information provided will improve ?Outcome: Progressing ?  ?Problem: Activity: ?Goal: Interest or engagement in activities will improve ?Outcome: Progressing ?Goal: Sleeping patterns will improve ?Outcome: Progressing ?  ?Problem: Coping: ?Goal: Ability to verbalize frustrations and anger appropriately will improve ?Outcome: Progressing ?Goal: Ability to demonstrate self-control will improve ?Outcome: Progressing ?  ?Problem: Health Behavior/Discharge Planning: ?Goal: Identification of resources available to assist in meeting health care needs will improve ?Outcome: Progressing ?Goal: Compliance with treatment plan for underlying cause of condition will improve ?Outcome: Progressing ?  ?Problem: Physical Regulation: ?Goal: Ability to maintain clinical measurements within normal limits will improve ?Outcome: Progressing ?  ?Problem: Safety: ?Goal: Periods of time without injury will increase ?Outcome: Progressing ?  ?

## 2022-01-23 NOTE — Progress Notes (Signed)
Recreation Therapy Notes ? ? ? ?Date: 01/23/2022 ?  ?Time: 1:35 pm  ?  ?Location: Craft room    ?  ?Behavioral response: N/A ? ?Intervention Topic: Self-esteem   ?  ?Discussion/Intervention:  ?Patient refused to attend group. ? ?Clinical Observations/Feedback: ?Patient refused to attend group. ? ?Tyress Loden LRT/CTRS  ? ? ? ? ? ? ? ?[place] Dohmen ?01/23/2022 3:12 PM ?

## 2022-01-23 NOTE — Progress Notes (Signed)
Patient had one episode of emesis this shift. Patient assisted back to her room and help to change into clean scrub. Patient ate supper in the day room among peers with good appetite. No further emesis this shift. Will continue to monitor. Remain safe on the unit with Q15 minute safety check. ?

## 2022-01-23 NOTE — [facility] Group Notes (Addendum)
[name]:  (Nursing/MHT/Case Management/Adjunct) ? ?Date:  01/23/2022  ?Time:  10:00 AM ? ?Type of Therapy:  Psychoeducational Skills ? ?Participation Level:  Minimal ? ?Participation Quality:  Appropriate ? ?Affect:  Appropriate ? ?Cognitive:  Disorganized and Confused ? ?Insight:  Lacking ? ?Engagement in Group:  Limited ? ?Modes of Intervention:  Discussion ? ?Summary of Progress/Problems: ?The pt attended group but had a minimal level of participation. ?Mckenzie [name] ?01/23/2022, 11:11 AM ?

## 2022-01-23 NOTE — [facility] Counselor (Addendum)
CSW made 1st attempt to complete PSA with pt. Pt was sleep and could not be roused. CSW will make attempt to complete comprehensive assessment at another time.  ? ?Mckenzie [name], MSW, LCSW-A ?01/23/2022 3:00 PM  ?

## 2022-01-23 NOTE — BH IP Treatment Plan (Signed)
Interdisciplinary Treatment and Diagnostic Plan Update ? ?01/23/2022 ?Time of Session: 9:30AM ?Mckenzie [name] ?MRN: [id number] ? ?Principal Diagnosis: Hallucinations ? ?Secondary Diagnoses: Principal Problem: ?  Hallucinations ? ? ?Current Medications:  ?Current Facility-Administered Medications  ?Medication Dose Route Frequency Provider Last Rate Last Admin  ? acetaminophen (TYLENOL) tablet 650 mg  650 mg Oral Q6H PRN Sherlon Handing, NP      ? alum & mag hydroxide-simeth (MAALOX/MYLANTA) 200-200-20 MG/5ML suspension 30 mL  30 mL Oral Q4H PRN Waldon Merl F, NP      ? amLODipine (NORVASC) tablet 5 mg  5 mg Oral Daily Waldon Merl F, NP   5 mg at 01/23/22 0940  ? ARIPiprazole (ABILIFY) tablet 2 mg  2 mg Oral q morning Waldon Merl F, NP   2 mg at 01/23/22 0941  ? buPROPion (WELLBUTRIN XL) 24 hr tablet 150 mg  150 mg Oral Daily Waldon Merl F, NP   150 mg at 01/23/22 0941  ? cholecalciferol (VITAMIN D3) tablet 2,000 Units  2,000 Units Oral Daily Sherlon Handing, NP   2,000 Units at 01/23/22 0940  ? dorzolamide (TRUSOPT) 2 % ophthalmic solution 1 drop  1 drop Left Eye BID Waldon Merl F, NP   1 drop at 01/23/22 5465  ? fluorometholone (FML) 0.1 % ophthalmic suspension 1 drop  1 drop Right Eye BID Waldon Merl F, NP   1 drop at 01/23/22 0941  ? ketorolac (ACULAR) 0.5 % ophthalmic solution 1 drop  1 drop Right Eye QHS Renda Rolls, RPH      ? latanoprost (XALATAN) 0.005 % ophthalmic solution 1 drop  1 drop Both Eyes QHS Barthold, Barbaraann Share F, NP      ? lisinopril (ZESTRIL) tablet 10 mg  10 mg Oral Daily Waldon Merl F, NP   10 mg at 01/23/22 0941  ? LORazepam (ATIVAN) tablet 0.5 mg  0.5 mg Oral BID PRN Sherlon Handing, NP      ? magnesium hydroxide (MILK OF MAGNESIA) suspension 30 mL  30 mL Oral Daily PRN Waldon Merl F, NP      ? magnesium oxide (MAG-OX) tablet 400 mg  400 mg Oral Q breakfast Waldon Merl F, NP   400 mg at 01/23/22 0745  ? metFORMIN (GLUCOPHAGE-XR) 24 hr  tablet 500 mg  500 mg Oral Q breakfast Waldon Merl F, NP   500 mg at 01/23/22 0354  ? multivitamin with minerals tablet 1 tablet  1 tablet Oral Q breakfast Sherlon Handing, NP   1 tablet at 01/23/22 0746  ? predniSONE (DELTASONE) tablet 5 mg  5 mg Oral Q breakfast Waldon Merl F, NP   5 mg at 01/23/22 0746  ? ?PTA Medications: ?Medications Prior to Admission  ?Medication Sig Dispense Refill Last Dose  ? amLODipine (NORVASC) 5 MG tablet Take 1 tablet by mouth once daily 90 tablet 0   ? ARIPiprazole (ABILIFY) 2 MG tablet Take 2 mg by mouth every morning.     ? aspirin EC 81 MG EC tablet Take 1 tablet (81 mg total) by mouth daily. X one month, then stop (Patient not taking: Reported on 01/22/2022) 30 tablet 0   ? Bromfenac Sodium 0.07 % SOLN Place 1 drop into the right eye every evening.     ? buPROPion (WELLBUTRIN XL) 150 MG 24 hr tablet Take by mouth.     ? Calcium Carb-Cholecalciferol (CALCIUM+D3 PO) Take 2 tablets by mouth in the morning.     ? COMBIGAN 0.2-0.5 %  ophthalmic solution Place 1 drop into both eyes every 12 (twelve) hours.     ? dorzolamide (TRUSOPT) 2 % ophthalmic solution Place 1 drop into the left eye 2 (two) times daily.     ? Ergocalciferol (VITAMIN D2) 50 MCG (2000 UT) TABS Take 1 tablet by mouth daily. 30 tablet 6   ? escitalopram (LEXAPRO) 20 MG tablet Take 1 tablet (20 mg total) by mouth daily. (Patient not taking: Reported on 01/22/2022) 90 tablet 1   ? fluorometholone (FML) 0.1 % ophthalmic suspension Place 1 drop into the right eye 2 (two) times daily.     ? hydrOXYzine (ATARAX) 10 MG tablet One or two po at bedtime (Patient not taking: Reported on 01/22/2022) 60 tablet 5   ? latanoprost (XALATAN) 0.005 % ophthalmic solution Place 1 drop into both eyes at bedtime.     ? lisinopril (ZESTRIL) 10 MG tablet Take 1 tablet (10 mg total) by mouth daily. 30 tablet 2   ? LORazepam (ATIVAN) 0.5 MG tablet Take 1 tablet (0.5 mg total) by mouth 2 (two) times daily as needed for anxiety. 30  tablet 2   ? magnesium oxide (MAG-OX) 400 MG tablet Take 400 mg by mouth in the morning.     ? metFORMIN (GLUCOPHAGE-XR) 500 MG 24 hr tablet Take 1 tablet (500 mg total) by mouth daily with breakfast. 30 tablet 2   ? Multiple Vitamin (MULTIVITAMIN WITH MINERALS) TABS tablet Take 1 tablet by mouth in the morning. One A Day Multivitamin     ? OLANZapine (ZYPREXA) 2.5 MG tablet Take 2 tablets (5 mg total) by mouth at bedtime. (Patient not taking: Reported on 01/22/2022) 60 tablet 5   ? predniSONE (DELTASONE) 5 MG tablet Take 1 tablet (5 mg total) by mouth daily. 30 tablet 1   ? QUEtiapine (SEROQUEL) 25 MG tablet Take 1 tablet (25 mg total) by mouth at bedtime. (Patient not taking: Reported on 01/22/2022) 30 tablet 0   ? ? ?Patient Stressors: Marital or family conflict   ? ?Patient Strengths: Motivation for treatment/growth  ?Supportive family/friends  ? ?Treatment Modalities: Medication Management, Group therapy, Case management,  ?1 to 1 session with clinician, Psychoeducation, Recreational therapy. ? ? ?Physician Treatment Plan for Primary Diagnosis: Hallucinations ?Long Term Goal(s):    ? ?Short Term Goals:   ? ?Medication Management: Evaluate patient's response, side effects, and tolerance of medication regimen. ? ?Therapeutic Interventions: 1 to 1 sessions, Unit Group sessions and Medication administration. ? ?Evaluation of Outcomes: Not Met ? ?Physician Treatment Plan for Secondary Diagnosis: Principal Problem: ?  Hallucinations ? ?Long Term Goal(s):    ? ?Short Term Goals:      ? ?Medication Management: Evaluate patient's response, side effects, and tolerance of medication regimen. ? ?Therapeutic Interventions: 1 to 1 sessions, Unit Group sessions and Medication administration. ? ?Evaluation of Outcomes: Not Met ? ? ?RN Treatment Plan for Primary Diagnosis: Hallucinations ?Long Term Goal(s): Knowledge of disease and therapeutic regimen to maintain health will improve ? ?Short Term Goals: Ability to remain free  from injury will improve, Ability to verbalize frustration and anger appropriately will improve, Ability to demonstrate self-control, Ability to participate in decision making will improve, Ability to verbalize feelings will improve, Ability to identify and develop effective coping behaviors will improve, and Compliance with prescribed medications will improve ? ?Medication Management: RN will administer medications as ordered by provider, will assess and evaluate patient's response and provide education to patient for prescribed medication. RN will report any adverse and/or  side effects to prescribing provider. ? ?Therapeutic Interventions: 1 on 1 counseling sessions, Psychoeducation, Medication administration, Evaluate responses to treatment, Monitor vital signs and CBGs as ordered, Perform/monitor CIWA, COWS, AIMS and Fall Risk screenings as ordered, Perform wound care treatments as ordered. ? ?Evaluation of Outcomes: Not Met ? ? ?LCSW Treatment Plan for Primary Diagnosis: Hallucinations ?Long Term Goal(s): Safe transition to appropriate next level of care at discharge, Engage patient in therapeutic group addressing interpersonal concerns. ? ?Short Term Goals: Engage patient in aftercare planning with referrals and resources, Increase social support, Increase ability to appropriately verbalize feelings, Increase emotional regulation, Facilitate acceptance of mental health diagnosis and concerns, Identify triggers associated with mental health/substance abuse issues, and Increase skills for wellness and recovery ? ?Therapeutic Interventions: Assess for all discharge needs, 1 to 1 time with Education officer, museum, Explore available resources and support systems, Assess for adequacy in community support network, Educate family and significant other(s) on suicide prevention, Complete Psychosocial Assessment, Interpersonal group therapy. ? ?Evaluation of Outcomes: Not Met ? ? ?Progress in Treatment: ?Attending groups:  No. ?Participating in groups: No. ?Taking medication as prescribed: Yes. ?Toleration medication: Yes. ?Family/Significant other contact made: No, will contact:  when given permission ?Patient understands diagnosis: Yes

## 2022-01-23 NOTE — Evaluation (Signed)
Physical Therapy Evaluation ?Patient Details ?Name: Mckenzie [name] ?MRN: [id number] ?DOB: [birth year] ?Today's Date: 01/23/2022 ? ?History of Present Illness ? 76 y/o female seen in geriatric behavioral unit for visual and auditory hallucinations and generalized weakness.PMH includes: anemia, arthritis, HTN, CVA, spinal fusion, ORIF L radius, giant cell arteritis, spinal surgery with subsequent cognitive decline.  ?Clinical Impression ? Apparently pt arrived with both her and daughter stating that she is unable to walk.  Today she managed, with a walker, to ambulate >200 ft with slow but steady gait.  She displays poor posture (that devolves) but she is able to improve with cuing.  She is able to maintain consistent, slow cadence but no LOBs or overt safety issues.  Unsure as to what baseline truly is, however seems that she is likely not too far from baseline, but we will maintain on caseload and try to maximize mobility/confidence/safety with activity. ?   ? ?Recommendations for follow up therapy are one component of a multi-disciplinary discharge planning process, led by the attending physician.  Recommendations may be updated based on patient status, additional functional criteria and insurance authorization. ? ?Follow Up Recommendations Home health PT (per progress/baseline) ? ?  ?Assistance Recommended at Discharge None  ?Patient can return home with the following ? Assistance with cooking/housework;Direct supervision/assist for medications management;Assist for transportation;Help with stairs or ramp for entrance;A little help with walking and/or transfers;A little help with bathing/dressing/bathroom ? ?  ?Equipment Recommendations    ?Recommendations for Other Services ?    ?  ?Functional Status Assessment Patient has had a recent decline in their functional status and demonstrates the ability to make significant improvements in function in a reasonable and predictable amount of time.  ? ?  ?Precautions /  Restrictions Precautions ?Precautions: Fall ?Restrictions ?Weight Bearing Restrictions: No  ? ?  ? ?Mobility ? Bed Mobility ?  ?  ?  ?  ?  ?  ?  ?General bed mobility comments: not tested, seated on arrival but able to scoot and move in bed with relative ease ?  ? ?Transfers ?Overall transfer level: Needs assistance ?Equipment used: Rolling walker (2 wheels) ?Transfers: Sit to/from Stand ?Sit to Stand: Min guard ?  ?  ?  ?  ?  ?General transfer comment: Pt failed to attain upright from low bed setting, with cuing for set up and sequencing she was able to rise w/o assist or hesitation ?  ? ?Ambulation/Gait ?Ambulation/Gait assistance: Min guard ?Gait Distance (Feet): 250 Feet ?Assistive device: Rolling walker (2 wheels) ?  ?  ?  ?  ?General Gait Details: Pt with slow, stooped ambulation, but able to maintain prolonged and relatively safe bout of ambulation.  Consistent low grade cuing to stayin in walker and maintain appropriate posture.  Pt with some fatigue but nothing excessive and with no overt LOBs or safety issues.  Unsure of true baseline but appeared appropriate for mobilization in the unit setting ? ?Stairs ?  ?  ?  ?  ?  ? ?Wheelchair Mobility ?  ? ?Modified Rankin (Stroke Patients Only) ?  ? ?  ? ?Balance   ?  ?  ?  ?  ?  ?  ?  ?  ?  ?  ?  ?  ?  ?  ?  ?  ?  ?  ?   ? ? ? ?Pertinent Vitals/Pain    ? ? ?Home Living Family/patient expects to be discharged to:: Private residence ?Living Arrangements: Spouse/significant other ?Available Help  at Discharge: Family;Available 24 hours/day ?Type of Home: House ?Home Access: Ramped entrance ?  ?  ?  ?  ?Home Equipment: Hospital bed;Rolling Walker (2 wheels);BSC/3in1;Shower seat;Wheelchair - manual;Tub bench ?   ?  ?Prior Function Prior Level of Function : Needs assist ?  ?  ?  ?  ?  ?  ?Mobility Comments: from Jan '23 note: husband reports pt needing assist for transfers, limited mobility with RW with a little assistance from spouse.  Today on eval variously stating  "I don't walk" all the way to "I walk 4 miles/day" ?  ?  ? ? ?Hand Dominance  ?   ? ?  ?Extremity/Trunk Assessment  ? Upper Extremity Assessment ?Upper Extremity Assessment: Generalized weakness ?  ? ?Lower Extremity Assessment ?Lower Extremity Assessment: Generalized weakness ?  ? ?   ?Communication  ? Communication: HOH  ?Cognition Arousal/Alertness: Awake/alert ?Behavior During Therapy: Restless ?Overall Cognitive Status: Difficult to assess ?  ?  ?  ?  ?  ?  ?  ?  ?  ?  ?  ?  ?  ?  ?  ?  ?  ?  ?  ? ?  ?General Comments   ? ?  ?Exercises    ? ?Assessment/Plan  ?  ?PT Assessment Patient needs continued PT services  ?PT Problem List Decreased range of motion;Decreased strength;Decreased activity tolerance;Decreased balance;Decreased mobility;Decreased coordination;Decreased knowledge of use of DME;Decreased safety awareness;Decreased cognition ? ?   ?  ?PT Treatment Interventions Functional mobility training;Stair training;DME instruction;Gait training;Therapeutic activities;Therapeutic exercise;Balance training;Cognitive remediation;Neuromuscular re-education;Patient/family education   ? ?PT Goals (Current goals can be found in the Care Plan section)  ?Acute Rehab PT Goals ?Patient Stated Goal: go home ?PT Goal Formulation: With patient ?Time For Goal Achievement: 02/06/22 ?Potential to Achieve Goals: Good ? ?  ?Frequency Min 2X/week ?  ? ? ?Co-evaluation   ?  ?  ?  ?  ? ? ?  ?AM-PAC PT "6 Clicks" Mobility  ?Outcome Measure Help needed turning from your back to your side while in a flat bed without using bedrails?: None ?Help needed moving from lying on your back to sitting on the side of a flat bed without using bedrails?: None ?Help needed moving to and from a bed to a chair (including a wheelchair)?: None ?Help needed standing up from a chair using your arms (e.g., wheelchair or bedside chair)?: None ?Help needed to walk in hospital room?: A Little ?Help needed climbing 3-5 steps with a railing? : A Little ?6  Click Score: 22 ? ?  ?End of Session Equipment Utilized During Treatment: Gait belt ?Activity Tolerance: Patient tolerated treatment well ?Patient left: with call bell/phone within reach ?  ?PT Visit Diagnosis: Unsteadiness on feet (R26.81);Difficulty in walking, not elsewhere classified (R26.2) ?  ? ?Time: 6979-4801 ?PT Time Calculation (min) (ACUTE ONLY): 18 min ? ? ?Charges:   PT Evaluation ?$PT Eval Low Complexity: 1 Low ?PT Treatments ?$Gait Training: 8-22 mins ?  ?   ? ? ?Kreg Shropshire, DPT ?01/23/2022, 11:30 AM ? ?

## 2022-01-23 NOTE — [facility] Suicide Risk Assessment (Signed)
[facility] Admission Suicide Risk Assessment ? ? ?Nursing information obtained from:  Patient ?Demographic factors:  Caucasian, Age 77 or older ?Current Mental Status:  NA ?Loss Factors:  NA ?Historical Factors:  NA ?Risk Reduction Factors:  NA ? ?Total Time spent with patient: 1 hour ?Principal Problem: Hallucinations ?Diagnosis:  Principal Problem: ?  Hallucinations ? ?Subjective Data: Mckenzie [name] is a 76 y.o. female   ?  ?Per the patient's daughter patient has been hallucinating with both auditory and visual hallucination since October 2022, started after she was hospitalized for UTI.  Patient has a psychiatrist with Dr. Weber Cooks about getting the patient admitted for geriatric psych. ?  ?Patient tells me she is here because her husband thinks she is psychotic.  She does endorse both auditory and visual hallucinations.  She sees animals in the backyard that are not there and also hears voices.  Also tells me that her husband is currently in a relationship with a 54 year old which makes her quite upset.  He denies any suicidal ideation or homicidal ideation.  But per triage note her daughter has said that she has been talking about cutting her own wrist. ? ?Continued Clinical Symptoms:  ?Alcohol Use Disorder Identification Test Final Score (AUDIT): 0 ?The "Alcohol Use Disorders Identification Test", Guidelines for Use in Primary Care, Second Edition.  World Pharmacologist [facility]). ?Score between 0-7:  no or low risk or alcohol related problems. ?Score between 8-15:  moderate risk of alcohol related problems. ?Score between 16-19:  high risk of alcohol related problems. ?Score 20 or above:  warrants further diagnostic evaluation for alcohol dependence and treatment. ? ? ?CLINICAL FACTORS:  ? Depression:   Delusional ?Currently Psychotic ? ? ?Musculoskeletal: ?Strength & Muscle Tone: decreased ?Gait & Station: unable to stand ?Patient leans: N/A ? ?Psychiatric Specialty Exam: ? ?Presentation  ?General Appearance:  Appropriate for Environment ? ?Eye Contact:Good ? ?Speech:Clear and Coherent ? ?Speech Volume:Normal ? ?Handedness:No data recorded ? ?Mood and Affect  ?Mood:Euthymic ? ?Affect:Congruent ? ? ?Thought Process  ?Thought Processes:No data recorded ?Descriptions of Associations:No data recorded ?Orientation:Full (Time, Place and Person) ? ?Thought Content:-- (My husband says I am confused) ? ?History of Schizophrenia/Schizoaffective disorder:No data recorded ?Duration of Psychotic Symptoms:No data recorded ?Hallucinations:Hallucinations: Visual ?Description of Visual Hallucinations: "Cats, dogs, women" ? ?Ideas of Reference:Delusions; Paranoia ? ?Suicidal Thoughts:Suicidal Thoughts: No ? ?Homicidal Thoughts:Homicidal Thoughts: No ? ? ?Sensorium  ?Memory:Immediate Fair ? ?Judgment:Fair ? ?Insight:Fair ? ? ?Executive Functions  ?Concentration:Fair ? ?Attention Span:Fair ? ?Recall:Fair ? ?[place] ? ?Language:Fair ? ? ?Psychomotor Activity  ?Psychomotor Activity:Psychomotor Activity: Normal ? ? ?Assets  ?Assets:Communication Skills; Desire for Improvement; Financial Resources/Insurance; Resilience ? ? ?Sleep  ?Sleep:Sleep: Fair ? ? ? ?Physical Exam: ?Physical Exam ?ROS ?Blood pressure (!) 183/76, pulse 86, temperature 98.5 ?F (36.9 ?C), temperature source Oral, resp. rate 18, height 5' (1.524 m), weight 43.1 kg, SpO2 99 %. Body mass index is 18.55 kg/m?. ? ? ?COGNITIVE FEATURES THAT CONTRIBUTE TO RISK:  ?Loss of executive function   ? ?SUICIDE RISK:  ? Minimal: No identifiable suicidal ideation.  Patients presenting with no risk factors but with morbid ruminations; may be classified as minimal risk based on the severity of the depressive symptoms ? ?PLAN OF CARE: See orders ? ?I certify that inpatient services furnished can reasonably be expected to improve the patient's condition.  ? ?Parks Ranger, DO ?01/23/2022, 10:18 AM ? ?

## 2022-01-23 NOTE — Tx Team (Signed)
Initial Treatment Plan ?01/23/2022 ?1:17 AM ?Mckenzie [name] ?QJF:[id number] ? ? ? ?PATIENT STRESSORS: ?Marital or family conflict   ? ? ?PATIENT STRENGTHS: ?Motivation for treatment/growth  ?Supportive family/friends  ? ? ?PATIENT IDENTIFIED PROBLEMS: ?Patient states, "I'm here to find out why I'm hallucinating."  ?Patient identifies problem she is experiencing, "My husband is cheating on me with an 77 year old woman."  ?  ?  ?  ?  ?  ?  ?  ?  ? ?DISCHARGE CRITERIA:  ?Improved stabilization in mood, thinking, and/or behavior ?Verbal commitment to aftercare and medication compliance ? ?PRELIMINARY DISCHARGE PLAN: ?Return to previous living arrangement ? ?PATIENT/FAMILY INVOLVEMENT: ?This treatment plan has been presented to and reviewed with the patient, Mckenzie [name].  The patient and family have been given the opportunity to ask questions and make suggestions. ? ?Conard Novak, RN ?01/23/2022, 1:17 AM ?

## 2022-01-23 NOTE — Progress Notes (Signed)
Patient admitted IVC to [facility] from [facility] ED with diagnosis of hallucinations. Patient presents to unit via [place] [facility].  Patient states, "I'm here so they can find out what's causing me to hallucinate.  I'm seeing cat's and dogs in my backyard and I don't have a cat or a dog."  Patient's affect is sad and depressed. Patient states her main stressor is "My husband is cheating on me with an 77 year old woman and I met her the other night."  Patient currently denies SI, HI, audio hallucinations or pain.  Patient reports she lives with her husband and has 3 daughters who live nearby. When asked what goals she would like to work on while here pt states, "I want to find out why I'm hallucinating."  BP 205/ 84/  MD notified and one time dose of Norvasc given.  Will recheck BP 1 hour after administration. Patient requires 2 person assist to stand and is unsteady. Patient states she uses a walker to ambulate at home.  PT order entered to evaluate for safety.  Placed pt on high risk fall precautions per policy and provided unit's rolling walker for ambulation.  Oriented patient to unit, room and call light, reviewed POC with all questions answered and understanding verbalzied. Denies any needs at this time.  Will continue to monitor with ongoing Q 15 minute safety checks per unit protocol. ? ?

## 2022-01-24 DIAGNOSIS — R443 Hallucinations, unspecified: Secondary | ICD-10-CM | POA: Diagnosis not present

## 2022-01-24 LAB — GLUCOSE, CAPILLARY: Glucose-Capillary: 177 mg/dL — ABNORMAL HIGH (ref 70–99)

## 2022-01-24 MED ORDER — PANTOPRAZOLE SODIUM 20 MG PO TBEC
20.0000 mg | DELAYED_RELEASE_TABLET | Freq: Every day | ORAL | Status: DC
  Administered 2022-01-24 – 2022-01-30 (×7): 20 mg via ORAL
  Filled 2022-01-24 (×8): qty 1

## 2022-01-24 MED ORDER — ESCITALOPRAM OXALATE 10 MG PO TABS
10.0000 mg | ORAL_TABLET | Freq: Every day | ORAL | Status: DC
  Administered 2022-01-24 – 2022-01-30 (×7): 10 mg via ORAL
  Filled 2022-01-24 (×7): qty 1

## 2022-01-24 NOTE — [facility] Suicide Risk Assessment (Signed)
[facility] INPATIENT:  Family/Significant Other Suicide Prevention Education ? ?Suicide Prevention Education:  ?Education Completed; [name],  (name of family member/significant other) has been identified by the patient as the family member/significant other with whom the patient will be residing, and identified as the person(s) who will aid the patient in the event of a mental health crisis (suicidal ideations/suicide attempt).  With written consent from the patient, the family member/significant other has been provided the following suicide prevention education, prior to the and/or following the discharge of the patient. ? ?The suicide prevention education provided includes the following: ?Suicide risk factors ?Suicide prevention and interventions ?National Suicide Hotline telephone number ?[facility] assessment telephone number ?[facility] Emergency Assistance 911 ?[place] and/or Residential Mobile Crisis Unit telephone number ? ?Request made of family/significant other to: ?Remove weapons (e.g., guns, rifles, knives), all items previously/currently identified as safety concern.   ?Remove drugs/medications (over-the-counter, prescriptions, illicit drugs), all items previously/currently identified as a safety concern. ? ?The family member/significant other verbalizes understanding of the suicide prevention education information provided.  The family member/significant other agrees to remove the items of safety concern listed above. ? ?Tyquan Carmickle A [place] ?01/24/2022, 4:00 PM ?

## 2022-01-24 NOTE — Progress Notes (Signed)
Patient is observed in the dayroom interacting appropriately with others. She uses a front wheel walker. She ambulated to her room on her own. She denies SI, HI, AVH, anxiety, and depression. She refused to take her scheduled Remeron because she just did not want to take it. I explained the reason for the medication and pt still refused. Pt is safe on the unit at this time. Q 15 minute safety checks in place.  ?

## 2022-01-24 NOTE — Progress Notes (Signed)
Recreation Therapy Notes ? ?INPATIENT RECREATION TR PLAN ? ?Patient Details ?Name: Mckenzie [name] ?MRN: [id number] ?DOB: [birth year] ?Today's Date: 01/24/2022 ? ?Rec Therapy Plan ?Is patient appropriate for Therapeutic Recreation?: Yes ?Treatment times per week: at least 3 ?Estimated Length of Stay: 5-7 days ?TR Treatment/Interventions: Group participation (Comment) ? ?Discharge Criteria ?Pt will be discharged from therapy if:: Discharged ?Treatment plan/goals/alternatives discussed and agreed upon by:: Patient/family ? ?Discharge Summary ?  ? ? ?Meranda Dechaine ?01/24/2022, 4:34 PM ?

## 2022-01-24 NOTE — Progress Notes (Signed)
Recreation Therapy Notes ? ?INPATIENT RECREATION THERAPY ASSESSMENT ? ?Patient Details ?Name: Mckenzie [name] ?MRN: [id number] ?DOB: 05-16-1946 ?Today's Date: 01/24/2022 ?      ?Information Obtained From: ?Patient ? ?Able to Participate in Assessment/Interview: ?Yes ? ?Patient Presentation: ?Responsive ? ?Reason for Admission (Per Patient): ?Active Symptoms ? ?Patient Stressors: ?Relationship ? ?Coping Skills:   ?Talk, Read, Music ? ?Leisure Interests (2+):  ?Petra Kuba - [employer], Sports - Dance, Social - Family, Music - Listen, Exercise - Walking (Sit outside) ? ?Frequency of Recreation/Participation: ?Monthly ? ?Awareness of Community Resources:  ?Yes ? ?Community Resources:  ?Church ? ?Current Use: ?Yes ? ?If no, Barriers?: ?  ? ?Expressed Interest in [employer] Information: ?  ? ?[place] of Residence:  ?[place] ? ?Patient Main Form of Transportation: ?Car ? ?Patient Strengths:  ?alot ? ?Patient Identified Areas of Improvement:  ?enjoy life ? ?Patient Goal for Hospitalization:  ?Enjoying things ? ?Current SI (including self-harm):  ?No ? ?Current HI:  ?No ? ?Current AVH: ?No ? ?Staff Intervention Plan: ?Group Attendance, Collaborate with Interdisciplinary Treatment Team ? ?Consent to Intern Participation: ?N/A ? ?Mckenzie [name] ?01/24/2022, 4:34 PM ?

## 2022-01-24 NOTE — Group Note (Signed)
[place] LCSW Group Therapy Note ? ? ?Group Date: 01/24/2022 ?Start Time: 5872 ?End Time: 7618 ? ? ?Type of Therapy/Topic:  Group Therapy:  Balance in Life ? ?Participation Level:  Active  ? ?Description of Group:   ? This group will address the concept of balance and how it feels and looks when one is unbalanced. Patients will be encouraged to process areas in their lives that are out of balance, and identify reasons for remaining unbalanced. Facilitators will guide patients utilizing problem- solving interventions to address and correct the stressor making their life unbalanced. Understanding and applying boundaries will be explored and addressed for obtaining  and maintaining a balanced life. Patients will be encouraged to explore ways to assertively make their unbalanced needs known to significant others in their lives, using other group members and facilitator for support and feedback. ? ?Therapeutic Goals: ?Patient will identify two or more emotions or situations they have that consume much of in their lives. ?Patient will identify signs/triggers that life has become out of balance:  ?Patient will identify two ways to set boundaries in order to achieve balance in their lives:  ?Patient will demonstrate ability to communicate their needs through discussion and/or role plays ? ?Summary of Patient Progress: ? ? ? ?Patient was present for the entirety of the group session. Patient was an active listener and participated in the topic of discussion. CSW met with patients in the courtyard to facilitate discussion. She stated that she was no longer having hallucinations but continued to endorse delusions regarding her husband's unfaithfulness. She stated that she wants to go home and feels that she is ready. Patient continues to have poor insight.  ? ? ? ?Therapeutic Modalities:   ?Cognitive Behavioral Therapy ?Solution-Focused Therapy ?Assertiveness Training ? ? ?Satara Virella A [place], LCSWA ?

## 2022-01-24 NOTE — Plan of Care (Signed)
?  Problem: Education: ?Goal: Knowledge of [facility] Education information/materials will improve ?Outcome: Not Progressing ?Goal: Emotional status will improve ?Outcome: Not Progressing ?Goal: Mental status will improve ?Outcome: Not Progressing ?Goal: Verbalization of understanding the information provided will improve ?Outcome: Not Progressing ?  ?Problem: Activity: ?Goal: Interest or engagement in activities will improve ?Outcome: Not Progressing ?Goal: Sleeping patterns will improve ?Outcome: Not Progressing ?  ?Problem: Coping: ?Goal: Ability to verbalize frustrations and anger appropriately will improve ?Outcome: Not Progressing ?Goal: Ability to demonstrate self-control will improve ?Outcome: Not Progressing ?  ?Problem: Health Behavior/Discharge Planning: ?Goal: Identification of resources available to assist in meeting health care needs will improve ?Outcome: Not Progressing ?Goal: Compliance with treatment plan for underlying cause of condition will improve ?Outcome: Not Progressing ?  ?Problem: Physical Regulation: ?Goal: Ability to maintain clinical measurements within normal limits will improve ?Outcome: Not Progressing ?  ?Problem: Safety: ?Goal: Periods of time without injury will increase ?Outcome: Not Progressing ?  ?

## 2022-01-24 NOTE — Progress Notes (Signed)
Recreation Therapy Notes ? ?Date: 01/24/2022  ?  ?Time: 1:30pm  ?  ?Location: Court yard  ?  ?Behavioral response: Appropriate ?  ?Intervention Topic:  Social Skills  ?  ?Discussion/Intervention:  ?Group content on today was focused on social skills. The group defined social skills and identified ways they use social skills. Patients expressed what obstacles they face when trying to be social. Participants described the importance of social skills. The group listed ways to improve social skills and reasons to improve social skills. Individuals had an opportunity to learn new and improve social skills as well as identify their weaknesses. ?  ?Clinical Observations/Feedback: ?Patient came to group and expressed past experiences with socializing. Individual was social with peers and staff while participating in the intervention.   ?Tecia Cinnamon LRT/CTRS  ?  ? ? ? ? ? ? ? ?Mckenzie [name] ?01/24/2022 4:10 PM ?

## 2022-01-24 NOTE — Progress Notes (Signed)
Pt visible on the unit, attends groups with participation. Pt denies SI/HI and AVH. Pt has been confused, delusional, paranoid and disorganized in thoughts. She does not understand why she is here and keeps asking to go home. She taken her medication, but she paranoid and guarded about it. She has talked to several people on the phone, but she is too confused to hold conversation for very long. She has difficulty swallowing and her medications need to crushed and put in applesauce. Pt fragile and needs assistance with meals and ADL's She one episode of Nausea and vomiting of food content. Blood glucose 177 and pt had no other symptoms.MD call notified of N/V, via sticky note. ? ?
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
## 2022-01-24 NOTE — Progress Notes (Addendum)
Patient slept well all throughout the night. She is safe on the unit at this time. Q 15 minute safety checks in place.  ?

## 2022-01-24 NOTE — Progress Notes (Signed)
[facility] MD Progress Note ? ?01/24/2022 11:48 AM ?Mckenzie [name]  ?MRN:  [id number] ?Subjective: Mckenzie [name] refused her Remeron last night.  Her Lexapro was not restarted but she might take it having been familiar with it so we will start that instead of the Remeron.  She denies any auditory or visual hallucinations but she remains very irritable and asks about leaving.  She denies any auditory or visual hallucinations although I think she is being guarded.  So far she has been compliant with her other medications.  Her daughter called.  Physical therapy came and saw her and she was able to ambulate to 100 feet with a walker. ? ?Principal Problem: Hallucinations ?Diagnosis: Principal Problem: ?  Hallucinations ? ?Total Time spent with patient: 15 minutes ? ?Past Psychiatric History: Treated by Dr. Modesta Messing last year for depression. ? ?Past Medical History:  ?Past Medical History:  ?Diagnosis Date  ? Anemia   ? low iron  ? Arthritis   ? Depression   ? Diabetes ([place]) 11/30/2021  ? High cholesterol   ? Hypertension   ? Retinal micro-aneurysm of right eye   ? Stroke [facility])   ? 2 or 3 mini strokes  ?  ?Past Surgical History:  ?Procedure Laterality Date  ? ARTERY BIOPSY Bilateral 12/15/2020  ? Procedure: BILATERAL TEMPORAL ARTERY BIOPSIES;  Surgeon: Angelia Mould, MD;  Location: [facility] OR;  Service: Vascular;  Laterality: Bilateral;  ? COLONOSCOPY WITH PROPOFOL N/A 12/09/2020  ? Procedure: COLONOSCOPY WITH PROPOFOL;  Surgeon: Gatha Mayer, MD;  Location: [facility] ENDOSCOPY;  Service: Endoscopy;  Laterality: N/A;  ? ESOPHAGOGASTRODUODENOSCOPY (EGD) WITH PROPOFOL N/A 12/09/2020  ? Procedure: ESOPHAGOGASTRODUODENOSCOPY (EGD) WITH PROPOFOL;  Surgeon: Gatha Mayer, MD;  Location: [facility] ENDOSCOPY;  Service: Endoscopy;  Laterality: N/A;  ? EYE SURGERY    ? FINGER SURGERY Left   ? RING FINGER  ? IR THORACENTESIS ASP PLEURAL SPACE W/IMG GUIDE  12/13/2020  ? OPEN REDUCTION INTERNAL FIXATION (ORIF) DISTAL RADIAL FRACTURE Left 10/05/2020  ?  Procedure: OPEN REDUCTION INTERNAL FIXATION (ORIF) DISTAL RADIAL FRACTURE;  Surgeon: Shona Needles, MD;  Location: [place];  Service: Orthopedics;  Laterality: Left;  supraclavicular block  ? POLYPECTOMY  12/09/2020  ? Procedure: POLYPECTOMY;  Surgeon: Gatha Mayer, MD;  Location: [facility] ENDOSCOPY;  Service: Endoscopy;;  ? SPINAL FUSION  08/10/2021  ? T10-L2  ? TUBAL LIGATION    ? ?Family History:  ?Family History  ?Problem Relation Age of Onset  ? Hyperlipidemia Mother   ? Hypertension Mother   ? Diabetes Mother   ? COPD Father   ? Depression Sister   ? Suicidality Brother   ? Drug abuse Brother   ? Healthy Daughter   ? Healthy Daughter   ? Healthy Daughter   ? Colon cancer Neg Hx   ? Celiac disease Neg Hx   ? Inflammatory bowel disease Neg Hx   ? ?Family Psychiatric  History: This is ?Social History:  ?Social History  ? ?Substance and Sexual Activity  ?Alcohol Use No  ?   ?Social History  ? ?Substance and Sexual Activity  ?Drug Use No  ?  ?Social History  ? ?Socioeconomic History  ? Marital status: Married  ?  Spouse name: Not on file  ? Number of children: 3  ? Years of education: 3  ? Highest education level: Not on file  ?Occupational History  ? Not on file  ?Tobacco Use  ? Smoking status: Former  ?  Packs/day: 0.10  ?  Years: 1.00  ?  Pack years: 0.10  ?  Types: Cigarettes  ?  Quit date: 90  ?  Years since quitting: 41.3  ? Smokeless tobacco: Never  ? Tobacco comments:  ?  for a short period  ?Vaping Use  ? Vaping Use: Never used  ?Substance and Sexual Activity  ? Alcohol use: No  ? Drug use: No  ? Sexual activity: Not on file  ?Other Topics Concern  ? Not on file  ?Social History Narrative  ? Right handed  ? Caffeine use: coffee daily  ? ?Social Determinants of Health  ? ?Financial Resource Strain: Not on file  ?Food Insecurity: Not on file  ?Transportation Needs: Not on file  ?Physical Activity: Not on file  ?Stress: Not on file  ?Social Connections: Not on file  ? ?Additional Social History:  ?  ?  ?  ?   ?  ?  ?  ?  ?  ?  ?  ? ?Sleep: Good ? ?Appetite:  Good ? ?Current Medications: ?Current Facility-Administered Medications  ?Medication Dose Route Frequency Provider Last Rate Last Admin  ? acetaminophen (TYLENOL) tablet 650 mg  650 mg Oral Q6H PRN Sherlon Handing, NP      ? alum & mag hydroxide-simeth (MAALOX/MYLANTA) 200-200-20 MG/5ML suspension 30 mL  30 mL Oral Q4H PRN Waldon Merl F, NP      ? amLODipine (NORVASC) tablet 5 mg  5 mg Oral Daily Waldon Merl F, NP   5 mg at 01/24/22 1102  ? buPROPion (WELLBUTRIN XL) 24 hr tablet 150 mg  150 mg Oral Daily Waldon Merl F, NP   150 mg at 01/24/22 1102  ? cholecalciferol (VITAMIN D3) tablet 2,000 Units  2,000 Units Oral Daily Waldon Merl F, NP   2,000 Units at 01/24/22 1103  ? dorzolamide (TRUSOPT) 2 % ophthalmic solution 1 drop  1 drop Left Eye BID Waldon Merl F, NP   1 drop at 01/24/22 1111  ? fluorometholone (FML) 0.1 % ophthalmic suspension 1 drop  1 drop Right Eye BID Waldon Merl F, NP   1 drop at 01/24/22 1112  ? haloperidol (HALDOL) tablet 0.5 mg  0.5 mg Oral [id number] Parks Ranger, DO   0.5 mg at 01/24/22 8115  ? ketorolac (ACULAR) 0.5 % ophthalmic solution 1 drop  1 drop Right Eye QHS Renda Rolls, RPH   1 drop at 01/23/22 2115  ? latanoprost (XALATAN) 0.005 % ophthalmic solution 1 drop  1 drop Both Eyes QHS Waldon Merl F, NP   1 drop at 01/23/22 2115  ? lisinopril (ZESTRIL) tablet 10 mg  10 mg Oral Daily Waldon Merl F, NP   10 mg at 01/24/22 1100  ? LORazepam (ATIVAN) tablet 0.5 mg  0.5 mg Oral BID PRN Sherlon Handing, NP      ? magnesium hydroxide (MILK OF MAGNESIA) suspension 30 mL  30 mL Oral Daily PRN Waldon Merl F, NP      ? magnesium oxide (MAG-OX) tablet 400 mg  400 mg Oral Q breakfast Waldon Merl F, NP   400 mg at 01/24/22 7262  ? metFORMIN (GLUCOPHAGE-XR) 24 hr tablet 500 mg  500 mg Oral Q breakfast Waldon Merl F, NP   500 mg at 01/24/22 0813  ? mirtazapine (REMERON) tablet 15 mg   15 mg Oral QHS Parks Ranger, DO      ? multivitamin with minerals tablet 1 tablet  1 tablet Oral Q breakfast Sherlon Handing, NP  1 tablet at 01/24/22 0806  ? predniSONE (DELTASONE) tablet 5 mg  5 mg Oral Q breakfast Waldon Merl F, NP   5 mg at 01/24/22 4497  ? ? ?Lab Results:  ?Results for orders placed or performed during the hospital encounter of 01/22/22 (from the past 48 hour(s))  ?Comprehensive metabolic panel     Status: Abnormal  ? Collection Time: 01/22/22  2:34 PM  ?Result Value Ref Range  ? Sodium 140 135 - 145 mmol/L  ? Potassium 3.3 (L) 3.5 - 5.1 mmol/L  ? Chloride 104 98 - 111 mmol/L  ? CO2 27 22 - 32 mmol/L  ? Glucose, Bld 141 (H) 70 - 99 mg/dL  ?  Comment: Glucose reference range applies only to samples taken after fasting for at least 8 hours.  ? BUN 16 8 - 23 mg/dL  ? Creatinine, Ser 0.72 0.44 - 1.00 mg/dL  ? Calcium 9.7 8.9 - 10.3 mg/dL  ? Total Protein 8.3 (H) 6.5 - 8.1 g/dL  ? Albumin 3.7 3.5 - 5.0 g/dL  ? AST 20 15 - 41 U/L  ? ALT 19 0 - 44 U/L  ? Alkaline Phosphatase 113 38 - 126 U/L  ? Total Bilirubin 0.5 0.3 - 1.2 mg/dL  ? GFR, Estimated >60 >60 mL/min  ?  Comment: (NOTE) ?Calculated using the CKD-EPI Creatinine Equation (2021) ?  ? Anion gap 9 5 - 15  ?  Comment: Performed at [facility], [address] [place], [place] [address]  ?Ethanol     Status: None  ? Collection Time: 01/22/22  2:34 PM  ?Result Value Ref Range  ? Alcohol, Ethyl (B) <10 <10 mg/dL  ?  Comment: (NOTE) ?Lowest detectable limit for serum alcohol is 10 mg/dL. ? ?For medical purposes only. ?Performed at [facility], [place], ?[place] [address] ?  ?Salicylate level     Status: Abnormal  ? Collection Time: 01/22/22  2:34 PM  ?Result Value Ref Range  ? Salicylate Lvl <1.1 (L) 7.0 - 30.0 mg/dL  ?  Comment: Performed at [facility], [address] [place], [place] [address]  ?Acetaminophen level     Status: Abnormal  ? Collection Time: 01/22/22  2:34 PM   ?Result Value Ref Range  ? Acetaminophen (Tylenol), Serum <10 (L) 10 - 30 ug/mL  ?  Comment: (NOTE) ?Therapeutic concentrations vary significantly. A range of 10-30 ug/mL  ?may be an effective concentra

## 2022-01-24 NOTE — Progress Notes (Signed)
Physical Therapy Treatment ?Patient Details ?Name: Mckenzie [name] ?MRN: [id number] ?DOB: 1946-07-11 ?Today's Date: 01/24/2022 ? ? ?History of Present Illness 76 y/o female seen in geriatric behavioral unit for visual and auditory hallucinations and generalized weakness.  PMH includes: anemia, arthritis, HTN, CVA, spinal fusion, ORIF L radius, giant cell arteritis, spinal surgery with subsequent cognitive decline. ? ?  ?PT Comments  ? ? Pt resting in chair (in day room) upon PT arrival; nurse cleared pt for therapy participation (pt noted with recent emesis but feeling better now).  During session pt SBA with transfers and CGA to SBA ambulating 200 feet with RW use (vc's required to stay closer to RW for more upright posture and also for longer step length).  Will continue to focus on strengthening and progressive functional mobility during hospitalization. ?   ?Recommendations for follow up therapy are one component of a multi-disciplinary discharge planning process, led by the attending physician.  Recommendations may be updated based on patient status, additional functional criteria and insurance authorization. ? ?Follow Up Recommendations ? Home health PT ?  ?  ?Assistance Recommended at Discharge PRN  ?Patient can return home with the following Assistance with cooking/housework;Direct supervision/assist for medications management;Assist for transportation;Help with stairs or ramp for entrance;A little help with walking and/or transfers;A little help with bathing/dressing/bathroom ?  ?Equipment Recommendations ? Rolling walker (2 wheels)  ?  ?Recommendations for Other Services   ? ? ?  ?Precautions / Restrictions Precautions ?Precautions: Fall ?Restrictions ?Weight Bearing Restrictions: No  ?  ? ?Mobility ? Bed Mobility ?  ?  ?  ?  ?  ?  ?  ?General bed mobility comments: Not tested during session ?  ? ?Transfers ?Overall transfer level: Needs assistance ?Equipment used: Rolling walker (2 wheels) ?Transfers: Sit  to/from Stand ?Sit to Stand: Supervision ?  ?  ?  ?  ?  ?General transfer comment: x2 trials from chair; mild increased effort to stand on own; vc's to line up to chair with RW prior to sitting required ?  ? ?Ambulation/Gait ?Ambulation/Gait assistance: Min guard, Supervision ?Gait Distance (Feet): 200 Feet ?Assistive device: Rolling walker (2 wheels) ?  ?Gait velocity: decreased ?  ?  ?General Gait Details: vc's to stay closer to RW and for longer step length required ? ? ?Stairs ?  ?  ?  ?  ?  ? ? ?Wheelchair Mobility ?  ? ?Modified Rankin (Stroke Patients Only) ?  ? ? ?  ?Balance Overall balance assessment: Needs assistance ?Sitting-balance support: No upper extremity supported, Feet supported ?Sitting balance-Leahy Scale: Good ?Sitting balance - Comments: steady sitting reaching within BOS ?  ?Standing balance support: Bilateral upper extremity supported, During functional activity, Reliant on assistive device for balance ?Standing balance-Leahy Scale: Fair ?Standing balance comment: steady ambulating with RW ?  ?  ?  ?  ?  ?  ?  ?  ?  ?  ?  ?  ? ?  ?Cognition Arousal/Alertness: Awake/alert ?Behavior During Therapy: [facility] for tasks assessed/performed ?Overall Cognitive Status: Difficult to assess ?  ?  ?  ?  ?  ?  ?  ?  ?  ?  ?  ?  ?  ?  ?  ?  ?General Comments: Pt oriented at least self ?  ?  ? ?  ?Exercises General Exercises - Lower Extremity ?Long Arc Quad: AROM, Strengthening, Both, 10 reps, Seated ?Hip Flexion/Marching: AROM, Strengthening, Both, 10 reps, Seated ? ?  ?General Comments  Nursing cleared  pt for participation in physical therapy.  Pt agreeable to PT session. ?  ?  ? ?Pertinent Vitals/Pain Pain Assessment ?Pain Assessment: No/denies pain ?Vitals (HR and O2 on room air) stable and WFL throughout treatment session.  ? ? ?Home Living   ?Living Arrangements: Spouse/significant other ?  ?  ?  ?  ?  ?  ?  ?  ?   ?  ?Prior Function    ?  ?  ?   ? ?PT Goals (current goals can now be found in the care plan  section) Acute Rehab PT Goals ?Patient Stated Goal: go home ?PT Goal Formulation: With patient ?Time For Goal Achievement: 02/06/22 ?Potential to Achieve Goals: Good ?Progress towards PT goals: Progressing toward goals ? ?  ?Frequency ? ? ? Min 2X/week ? ? ? ?  ?PT Plan Current plan remains appropriate  ? ? ?Co-evaluation   ?  ?  ?  ?  ? ?  ?AM-PAC PT "6 Clicks" Mobility   ?Outcome Measure ? Help needed turning from your back to your side while in a flat bed without using bedrails?: None ?Help needed moving from lying on your back to sitting on the side of a flat bed without using bedrails?: None ?Help needed moving to and from a bed to a chair (including a wheelchair)?: A Little ?Help needed standing up from a chair using your arms (e.g., wheelchair or bedside chair)?: A Little ?Help needed to walk in hospital room?: A Little ?Help needed climbing 3-5 steps with a railing? : A Little ?6 Click Score: 20 ? ?  ?End of Session Equipment Utilized During Treatment: Gait belt ?Activity Tolerance: Patient tolerated treatment well ?Patient left: in chair;with nursing/sitter in room (in day room) ?Nurse Communication: Mobility status;Precautions ?PT Visit Diagnosis: Unsteadiness on feet (R26.81);Difficulty in walking, not elsewhere classified (R26.2) ?  ? ? ?Time: 2409-7353 ?PT Time Calculation (min) (ACUTE ONLY): 16 min ? ?Charges:  $Therapeutic Exercise: 8-22 mins          ?          ?Leitha Bleak, PT ?01/24/22, 5:28 PM ? ? ?

## 2022-01-25 ENCOUNTER — Ambulatory Visit (INDEPENDENT_AMBULATORY_CARE_PROVIDER_SITE_OTHER): Payer: Medicare Other

## 2022-01-25 DIAGNOSIS — Z8673 Personal history of transient ischemic attack (TIA), and cerebral infarction without residual deficits: Secondary | ICD-10-CM

## 2022-01-25 DIAGNOSIS — I10 Essential (primary) hypertension: Secondary | ICD-10-CM

## 2022-01-25 DIAGNOSIS — Z8616 Personal history of COVID-19: Secondary | ICD-10-CM | POA: Diagnosis not present

## 2022-01-25 DIAGNOSIS — E876 Hypokalemia: Secondary | ICD-10-CM

## 2022-01-25 DIAGNOSIS — M316 Other giant cell arteritis: Secondary | ICD-10-CM

## 2022-01-25 DIAGNOSIS — R3981 Functional urinary incontinence: Secondary | ICD-10-CM

## 2022-01-25 DIAGNOSIS — G9341 Metabolic encephalopathy: Secondary | ICD-10-CM

## 2022-01-25 DIAGNOSIS — F32A Depression, unspecified: Secondary | ICD-10-CM

## 2022-01-25 DIAGNOSIS — M4854XD Collapsed vertebra, not elsewhere classified, thoracic region, subsequent encounter for fracture with routine healing: Secondary | ICD-10-CM | POA: Diagnosis not present

## 2022-01-25 DIAGNOSIS — D638 Anemia in other chronic diseases classified elsewhere: Secondary | ICD-10-CM

## 2022-01-25 DIAGNOSIS — F4323 Adjustment disorder with mixed anxiety and depressed mood: Secondary | ICD-10-CM

## 2022-01-25 DIAGNOSIS — E440 Moderate protein-calorie malnutrition: Secondary | ICD-10-CM

## 2022-01-25 MED ORDER — AMLODIPINE BESYLATE 5 MG PO TABS
10.0000 mg | ORAL_TABLET | Freq: Every day | ORAL | Status: DC
  Administered 2022-01-26 – 2022-01-30 (×4): 10 mg via ORAL
  Filled 2022-01-25 (×5): qty 2

## 2022-01-25 MED ORDER — ENSURE ENLIVE PO LIQD
237.0000 mL | Freq: Three times a day (TID) | ORAL | Status: DC
  Administered 2022-01-25 – 2022-01-30 (×14): 237 mL via ORAL

## 2022-01-25 MED ORDER — PREDNISONE 2.5 MG PO TABS
2.5000 mg | ORAL_TABLET | Freq: Every day | ORAL | Status: DC
Start: 2022-01-26 — End: 2022-01-30
  Administered 2022-01-26 – 2022-01-30 (×5): 2.5 mg via ORAL
  Filled 2022-01-25 (×5): qty 1

## 2022-01-25 NOTE — Progress Notes (Signed)
Pt on close observation due to her confusion, hx of vertigo, falls precaution and difficulty walking/weakness. Pt in the geri-chair in the dayroom most of the time for safety. She was up to ambulate with PT and staff x2.  Pt encouraged to eat and drink. She need assist with most activities. Pt continues to be confused, delusional and paranoid. She thinks her husband has a girlfriend and that she has no reason to be here or for medications. Pt in groups with no participation.  Pt does not answer the questions about SI/HI and AVH. ?

## 2022-01-25 NOTE — Progress Notes (Signed)
[facility] MD Progress Note ? ?01/25/2022 1:17 PM ?Mckenzie [name]  ?MRN:  [id number] ?Subjective: Mckenzie [name] is currently in a Geri chair with direct assistance.  She apparently has trouble getting up and transferring.  PT has recommended home health.  She is taking her medications as prescribed and denies any side effects.  She is a little irritable when I asked her if she is hearing voices or seeing things because she says she is not.  She says that she just wants to go home.  I spoke with her daughter and we thought it would be a good idea for her husband to come and visit because her delusions that he is having an affair tend to increase in his presence.  She has not spoke of him having an affair when he is not here.  She also informed me that the neurologist would like to cut her prednisone in half.  I discussed with her that she refused the Remeron so we put her back on Lexapro and discontinued her Abilify.  She is taking Lexapro, along with Wellbutrin that Dr. Casimiro Needle recently started, and Haldol that I started.  Her affect has improved and she does not seem to be responding to intradural stimuli.  There is no evidence of EPS or TD. ? ?Principal Problem: Hallucinations ?Diagnosis: Principal Problem: ?  Hallucinations ? ?Total Time spent with patient: 15 minutes ? ?Past Psychiatric History: Treated by Dr. Modesta Messing last year for depression. ? ?Past Medical History:  ?Past Medical History:  ?Diagnosis Date  ? Anemia   ? low iron  ? Arthritis   ? Depression   ? Diabetes ([place]) 11/30/2021  ? High cholesterol   ? Hypertension   ? Retinal micro-aneurysm of right eye   ? Stroke [facility])   ? 2 or 3 mini strokes  ?  ?Past Surgical History:  ?Procedure Laterality Date  ? ARTERY BIOPSY Bilateral 12/15/2020  ? Procedure: BILATERAL TEMPORAL ARTERY BIOPSIES;  Surgeon: Angelia Mould, MD;  Location: [facility] OR;  Service: Vascular;  Laterality: Bilateral;  ? COLONOSCOPY WITH PROPOFOL N/A 12/09/2020  ? Procedure: COLONOSCOPY WITH  PROPOFOL;  Surgeon: Gatha Mayer, MD;  Location: [facility] ENDOSCOPY;  Service: Endoscopy;  Laterality: N/A;  ? ESOPHAGOGASTRODUODENOSCOPY (EGD) WITH PROPOFOL N/A 12/09/2020  ? Procedure: ESOPHAGOGASTRODUODENOSCOPY (EGD) WITH PROPOFOL;  Surgeon: Gatha Mayer, MD;  Location: [facility] ENDOSCOPY;  Service: Endoscopy;  Laterality: N/A;  ? EYE SURGERY    ? FINGER SURGERY Left   ? RING FINGER  ? IR THORACENTESIS ASP PLEURAL SPACE W/IMG GUIDE  12/13/2020  ? OPEN REDUCTION INTERNAL FIXATION (ORIF) DISTAL RADIAL FRACTURE Left 10/05/2020  ? Procedure: OPEN REDUCTION INTERNAL FIXATION (ORIF) DISTAL RADIAL FRACTURE;  Surgeon: Shona Needles, MD;  Location: [facility];  Service: Orthopedics;  Laterality: Left;  supraclavicular block  ? POLYPECTOMY  12/09/2020  ? Procedure: POLYPECTOMY;  Surgeon: Gatha Mayer, MD;  Location: [facility] ENDOSCOPY;  Service: Endoscopy;;  ? SPINAL FUSION  08/10/2021  ? T10-L2  ? TUBAL LIGATION    ? ?Family History:  ?Family History  ?Problem Relation Age of Onset  ? Hyperlipidemia Mother   ? Hypertension Mother   ? Diabetes Mother   ? COPD Father   ? Depression Sister   ? Suicidality Brother   ? Drug abuse Brother   ? Healthy Daughter   ? Healthy Daughter   ? Healthy Daughter   ? Colon cancer Neg Hx   ? Celiac disease Neg Hx   ? Inflammatory bowel disease  Neg Hx   ? ? ?Social History:  ?Social History  ? ?Substance and Sexual Activity  ?Alcohol Use No  ?   ?Social History  ? ?Substance and Sexual Activity  ?Drug Use No  ?  ?Social History  ? ?Socioeconomic History  ? Marital status: Married  ?  Spouse name: Not on file  ? Number of children: 3  ? Years of education: 60  ? Highest education level: Not on file  ?Occupational History  ? Not on file  ?Tobacco Use  ? Smoking status: Former  ?  Packs/day: 0.10  ?  Years: 1.00  ?  Pack years: 0.10  ?  Types: Cigarettes  ?  Quit date: 77  ?  Years since quitting: 41.3  ? Smokeless tobacco: Never  ? Tobacco comments:  ?  for a short period  ?Vaping Use  ? Vaping Use:  Never used  ?Substance and Sexual Activity  ? Alcohol use: No  ? Drug use: No  ? Sexual activity: Not on file  ?Other Topics Concern  ? Not on file  ?Social History Narrative  ? Right handed  ? Caffeine use: coffee daily  ? ?Social Determinants of Health  ? ?Financial Resource Strain: Not on file  ?Food Insecurity: Not on file  ?Transportation Needs: Not on file  ?Physical Activity: Not on file  ?Stress: Not on file  ?Social Connections: Not on file  ? ?Additional Social History:  ?  ?  ?  ?  ?  ?  ?  ?  ?  ?  ?  ? ?Sleep: Good ? ?Appetite:  Fair ? ?Current Medications: ?Current Facility-Administered Medications  ?Medication Dose Route Frequency Provider Last Rate Last Admin  ? acetaminophen (TYLENOL) tablet 650 mg  650 mg Oral Q6H PRN Sherlon Handing, NP      ? alum & mag hydroxide-simeth (MAALOX/MYLANTA) 200-200-20 MG/5ML suspension 30 mL  30 mL Oral Q4H PRN Waldon Merl F, NP      ? amLODipine (NORVASC) tablet 5 mg  5 mg Oral Daily Waldon Merl F, NP   5 mg at 01/25/22 1004  ? buPROPion (WELLBUTRIN XL) 24 hr tablet 150 mg  150 mg Oral Daily Waldon Merl F, NP   150 mg at 01/25/22 1004  ? cholecalciferol (VITAMIN D3) tablet 2,000 Units  2,000 Units Oral Daily Waldon Merl F, NP   2,000 Units at 01/25/22 1005  ? dorzolamide (TRUSOPT) 2 % ophthalmic solution 1 drop  1 drop Left Eye BID Waldon Merl F, NP   1 drop at 01/25/22 1024  ? escitalopram (LEXAPRO) tablet 10 mg  10 mg Oral QPC breakfast Parks Ranger, DO   10 mg at 01/25/22 1006  ? fluorometholone (FML) 0.1 % ophthalmic suspension 1 drop  1 drop Right Eye BID Waldon Merl F, NP   1 drop at 01/25/22 1024  ? haloperidol (HALDOL) tablet 0.5 mg  0.5 mg Oral [id number] Parks Ranger, DO   0.5 mg at 01/25/22 1004  ? ketorolac (ACULAR) 0.5 % ophthalmic solution 1 drop  1 drop Right Eye QHS Renda Rolls, RPH   1 drop at 01/24/22 2201  ? latanoprost (XALATAN) 0.005 % ophthalmic solution 1 drop  1 drop Both Eyes QHS  Waldon Merl F, NP   1 drop at 01/24/22 2202  ? lisinopril (ZESTRIL) tablet 10 mg  10 mg Oral Daily Waldon Merl F, NP   10 mg at 01/25/22 1002  ? LORazepam (ATIVAN) tablet 0.5 mg  0.5 mg Oral BID PRN Sherlon Handing, NP      ? magnesium hydroxide (MILK OF MAGNESIA) suspension 30 mL  30 mL Oral Daily PRN Waldon Merl F, NP      ? magnesium oxide (MAG-OX) tablet 400 mg  400 mg Oral Q breakfast Waldon Merl F, NP   400 mg at 01/25/22 1001  ? metFORMIN (GLUCOPHAGE-XR) 24 hr tablet 500 mg  500 mg Oral Q breakfast Waldon Merl F, NP   500 mg at 01/25/22 1001  ? multivitamin with minerals tablet 1 tablet  1 tablet Oral Q breakfast Sherlon Handing, NP   1 tablet at 01/25/22 1004  ? pantoprazole (PROTONIX) EC tablet 20 mg  20 mg Oral Daily Parks Ranger, DO   20 mg at 01/25/22 1101  ? [START ON 01/26/2022] predniSONE (DELTASONE) tablet 2.5 mg  2.5 mg Oral Q breakfast Parks Ranger, DO      ? ? ?Lab Results:  ?Results for orders placed or performed during the hospital encounter of 01/22/22 (from the past 48 hour(s))  ?Glucose, capillary     Status: Abnormal  ? Collection Time: 01/24/22  3:02 PM  ?Result Value Ref Range  ? Glucose-Capillary 177 (H) 70 - 99 mg/dL  ?  Comment: Glucose reference range applies only to samples taken after fasting for at least 8 hours.  ? ? ?Blood Alcohol level:  ?Lab Results  ?Component Value Date  ? ETH <10 01/22/2022  ? ? ?Metabolic Disorder Labs: ?Lab Results  ?Component Value Date  ? HGBA1C 7.4 (H) 11/29/2021  ? MPG 102.54 06/26/2021  ? MPG 102.54 06/26/2021  ? ?No results found for: PROLACTIN ?Lab Results  ?Component Value Date  ? CHOL 251 (H) 11/29/2021  ? TRIG 214 (H) 11/29/2021  ? HDL 55 11/29/2021  ? CHOLHDL 4.6 (H) 11/29/2021  ? VLDL 30 06/26/2021  ? LDLCALC 157 (H) 11/29/2021  ? LDLCALC 188 (H) 06/26/2021  ? ? ?Physical Findings: ?AIMS:  , ,  ,  ,    ?CIWA:    ?COWS:    ? ?Musculoskeletal: ?Strength & Muscle Tone: within normal limits ?Gait &  Station: unable to stand ?Patient leans: N/A ? ?Psychiatric Specialty Exam: ? ?Presentation  ?General Appearance: Appropriate for Environment ? ?Eye Contact:Good ? ?Speech:Clear and Coherent ? ?Speech Volume:Normal ?

## 2022-01-25 NOTE — Plan of Care (Signed)
  Problem: Education: Goal: Knowledge of  General Education information/materials will improve Outcome: Progressing Goal: Emotional status will improve Outcome: Progressing Goal: Mental status will improve Outcome: Progressing Goal: Verbalization of understanding the information provided will improve Outcome: Progressing   Problem: Activity: Goal: Interest or engagement in activities will improve Outcome: Progressing Goal: Sleeping patterns will improve Outcome: Progressing   Problem: Coping: Goal: Ability to verbalize frustrations and anger appropriately will improve Outcome: Progressing Goal: Ability to demonstrate self-control will improve Outcome: Progressing   Problem: Health Behavior/Discharge Planning: Goal: Identification of resources available to assist in meeting health care needs will improve Outcome: Progressing Goal: Compliance with treatment plan for underlying cause of condition will improve Outcome: Progressing   Problem: Physical Regulation: Goal: Ability to maintain clinical measurements within normal limits will improve Outcome: Progressing   Problem: Safety: Goal: Periods of time without injury will increase Outcome: Progressing   

## 2022-01-25 NOTE — Progress Notes (Signed)
Patient is seen in the dayroom in the recliner. She uses a front wheel walker and requires moderate assistance with gait and toileting. Pt has gait belt. She denies pain, SI, HI, AVH, anxiety, and depression. She is AAOx4. She stated that she heard some women saying that she would be leaving tomorrow. She also stated that she may not be here tomorrow. Pt can be disorganized at times. She is safe on the unit at this time. Q 15 minute safety checks in place.   ?

## 2022-01-25 NOTE — Progress Notes (Signed)
Recreation Therapy Notes ? ?Date: 01/25/2022 ?  ?Time: 1:35pm   ?  ?Location: Dayroom  ?  ?Behavioral response: Appropriate ?  ?Intervention Topic: Communication   ?  ?Discussion/Intervention:  ?Group content today was focused on communication. The group defined communication and ways to communicate with others. Individuals stated reason why communication is important and some reasons to communicate with others. Patients expressed if they thought they were good at communicating with others and ways they could improve their communication skills. The group identified important parts of communication and some experiences they have had in the past with communication. The group participated in the intervention ?What is that??, where they had a chance to test out their communication skills and identify ways to improve their communication techniques. ?Clinical Observations/Feedback: ?Patient came to group and observed.  ?Mckenzie [name] LRT/CTRS  ? ? ? ? ? ? ? ?Mckenzie [name] ?01/25/2022 2:30 PM ?

## 2022-01-25 NOTE — Progress Notes (Signed)
Pt's husband came to visit. Pt told him she was divorcing him, because he was having an affair with a 72 old girl. She told her husband the girl is pregnant. Pt is confused, paranoid and delusional. She is hard to redirect when discussing these delusions. ?

## 2022-01-25 NOTE — Progress Notes (Signed)
This am pt in room trying to stand up, while stepping in her own stool. The stool was on the floor, bed, and on the patient. Pt was very confused and disorganized requesting to staff to clean her up. She was moved to room LL31 in order to be closer to the nurses station. Bed alarm activated. Pt in geri /chair and gait belt intact. Pt requires 1:1 staffing, because she tries to get up on her own, but she needs assist with walking, getting up and sitting down.she needs assistance with all her ADLs. Her daughter reports her mother has vertigo, which increases her need for a consistent assistance. Her medications are crushed and put in applesauce to prevent aspiration, due to swallowing difficulty. This information will be given to the gero doctor, treatment team and nursing staff. ?

## 2022-01-25 NOTE — Progress Notes (Signed)
Physical Therapy Treatment ?Patient Details ?Name: Mckenzie [name] ?MRN: [id number] ?DOB: [birth year] ?Today's Date: 01/25/2022 ? ? ?History of Present Illness 76 y/o female seen in geriatric behavioral unit for visual and auditory hallucinations and generalized weakness.  PMH includes: anemia, arthritis, HTN, CVA, spinal fusion, ORIF L radius, giant cell arteritis, spinal surgery with subsequent cognitive decline. ? ?  ?PT Comments  ? ? Pt ambulating with NT in hallway upon PT arrival.  Pt able to continue ambulating 160 feet and then (after toileting) another 50 feet with RW use.  No loss of balance noted with ambulation using RW but pt did require intermittent vc's to stay closer to RW and for longer step length.  Mild unsteadiness noted while pt managing pants for toileting in standing (CGA provided for safety but pt able to maintain standing balance on own).  Will continue to focus on strengthening, balance, and progressive functional mobility during hospitalization. ?   ?Recommendations for follow up therapy are one component of a multi-disciplinary discharge planning process, led by the attending physician.  Recommendations may be updated based on patient status, additional functional criteria and insurance authorization. ? ?Follow Up Recommendations ? Home health PT ?  ?  ?Assistance Recommended at Discharge Intermittent Supervision/Assistance  ?Patient can return home with the following Assistance with cooking/housework;Direct supervision/assist for medications management;Assist for transportation;Help with stairs or ramp for entrance;A little help with walking and/or transfers;A little help with bathing/dressing/bathroom ?  ?Equipment Recommendations ? Rolling walker (2 wheels)  ?  ?Recommendations for Other Services   ? ? ?  ?Precautions / Restrictions Precautions ?Precautions: Fall ?Restrictions ?Weight Bearing Restrictions: No  ?  ? ?Mobility ? Bed Mobility ?  ?  ?  ?  ?  ?  ?  ?General bed mobility  comments: Not tested during session ?  ? ?Transfers ?Overall transfer level: Needs assistance ?Equipment used: Rolling walker (2 wheels) ?Transfers: Sit to/from Stand ?Sit to Stand: Supervision ?  ?  ?  ?  ?  ?General transfer comment: x1 trial from recliner; x1 trial from bed; x1 trial from toilet; mild increased effort to stand noted ?  ? ?Ambulation/Gait ?Ambulation/Gait assistance: Min guard, Supervision ?Gait Distance (Feet):  (160 feet; 50 feet) ?Assistive device: Rolling walker (2 wheels) ?  ?Gait velocity: decreased ?  ?  ?General Gait Details: vc's to stay closer to RW and for longer step length required intermittently ? ? ?Stairs ?  ?  ?  ?  ?  ? ? ?Wheelchair Mobility ?  ? ?Modified Rankin (Stroke Patients Only) ?  ? ? ?  ?Balance Overall balance assessment: Needs assistance ?Sitting-balance support: No upper extremity supported, Feet supported ?Sitting balance-Leahy Scale: Good ?Sitting balance - Comments: steady sitting reaching within BOS ?  ?Standing balance support: Bilateral upper extremity supported, During functional activity, Reliant on assistive device for balance ?Standing balance-Leahy Scale: Fair ?Standing balance comment: steady ambulating with RW; mild unsteadiness noted with pt managing pants for toileting (CGA provided for safety) ?  ?  ?  ?  ?  ?  ?  ?  ?  ?  ?  ?  ? ?  ?Cognition Arousal/Alertness: Awake/alert ?Behavior During Therapy: [facility] for tasks assessed/performed ?Overall Cognitive Status: Difficult to assess ?  ?  ?  ?  ?  ?  ?  ?  ?  ?  ?  ?  ?  ?  ?  ?  ?General Comments: Pt oriented at least self ?  ?  ? ?  ?  Exercises   ? ?  ?General Comments  Nursing cleared pt for participation in physical therapy.  Pt agreeable to PT session. ?  ?  ? ?Pertinent Vitals/Pain Pain Assessment ?Pain Assessment: No/denies pain  ? ? ?Home Living   ?  ?  ?  ?  ?  ?  ?  ?  ?  ?   ?  ?Prior Function    ?  ?  ?   ? ?PT Goals (current goals can now be found in the care plan section) Acute Rehab PT  Goals ?Patient Stated Goal: go home ?PT Goal Formulation: With patient ?Time For Goal Achievement: 02/06/22 ?Potential to Achieve Goals: Good ?Progress towards PT goals: Progressing toward goals ? ?  ?Frequency ? ? ? Min 2X/week ? ? ? ?  ?PT Plan Current plan remains appropriate  ? ? ?Co-evaluation   ?  ?  ?  ?  ? ?  ?AM-PAC PT "6 Clicks" Mobility   ?Outcome Measure ? Help needed turning from your back to your side while in a flat bed without using bedrails?: None ?Help needed moving from lying on your back to sitting on the side of a flat bed without using bedrails?: None ?Help needed moving to and from a bed to a chair (including a wheelchair)?: A Little ?Help needed standing up from a chair using your arms (e.g., wheelchair or bedside chair)?: A Little ?Help needed to walk in hospital room?: A Little ?Help needed climbing 3-5 steps with a railing? : A Little ?6 Click Score: 20 ? ?  ?End of Session Equipment Utilized During Treatment: Gait belt ?Activity Tolerance: Patient tolerated treatment well ?Patient left: in chair (in day room; in sight of pt's nurse) ?Nurse Communication: Mobility status;Precautions ?PT Visit Diagnosis: Unsteadiness on feet (R26.81);Difficulty in walking, not elsewhere classified (R26.2) ?  ? ? ?Time: 1451-1510 ?PT Time Calculation (min) (ACUTE ONLY): 19 min ? ?Charges:  $Therapeutic Activity: 8-22 mins          ?          ?Leitha Bleak, PT ?01/25/22, 3:54 PM ? ? ?

## 2022-01-26 NOTE — Group Note (Signed)
[place] LCSW Group Therapy Note ? ? ? ?Group Date: 01/26/2022 ?Start Time: 1430 ?End Time: 5809 ? ?Type of Therapy and Topic:  Group Therapy:  Overcoming Obstacles ? ?Participation Level:  [facility] PARTICIPATION LEVEL: Did Not Attend ? ? ? ?Description of Group:   ?In this group patients will be encouraged to explore what they see as obstacles to their own wellness and recovery. They will be guided to discuss their thoughts, feelings, and behaviors related to these obstacles. The group will process together ways to cope with barriers, with attention given to specific choices patients can make. Each patient will be challenged to identify changes they are motivated to make in order to overcome their obstacles. This group will be process-oriented, with patients participating in exploration of their own experiences as well as giving and receiving support and challenge from other group members. ? ?Therapeutic Goals: ?1. Patient will identify personal and current obstacles as they relate to admission. ?2. Patient will identify barriers that currently interfere with their wellness or overcoming obstacles.  ?3. Patient will identify feelings, thought process and behaviors related to these barriers. ?4. Patient will identify two changes they are willing to make to overcome these obstacles:  ? ? ?Summary of Patient Progress ? ? ?X ? ? ?Therapeutic Modalities:   ?Cognitive Behavioral Therapy ?Solution Focused Therapy ?Motivational Interviewing ?Relapse Prevention Therapy ? ? ?Rufino Staup A [place], LCSWA ?

## 2022-01-26 NOTE — Progress Notes (Signed)
[facility] MD Progress Note ? ?01/26/2022 2:12 PM ?Mckenzie [name]  ?MRN:  [id number] ?Subjective:  ?[name] remarks "I am feeling fine today."  Says that her daughter is being buried today, who passed away at the age of 55.  Also reports that she has lost a 77 year old son in the past, and 2 other twin sons.  Says that she used to hear voices but cannot remember when she last did.  States that her husband is still living with a 65 year old girl, and that they now have a little baby together.  "I went to go see her at her house yesterday."  "I am happy for him."  Insist that she will be discharging today.  Patient has been calm and pleasant on the unit. Sleeping well. No safety concerns.  ? ?Principal Problem: Hallucinations ?Diagnosis: Principal Problem: ?  Hallucinations ? ?Total Time spent with patient: 25 minutes ? ?Past Psychiatric History: Treated by Dr. Modesta Messing last year for depression. ? ?Past Medical History:  ?Past Medical History:  ?Diagnosis Date  ? Anemia   ? low iron  ? Arthritis   ? Depression   ? Diabetes ([place]) 11/30/2021  ? High cholesterol   ? Hypertension   ? Retinal micro-aneurysm of right eye   ? Stroke [facility])   ? 2 or 3 mini strokes  ?  ?Past Surgical History:  ?Procedure Laterality Date  ? ARTERY BIOPSY Bilateral 12/15/2020  ? Procedure: BILATERAL TEMPORAL ARTERY BIOPSIES;  Surgeon: Angelia Mould, MD;  Location: [facility] OR;  Service: Vascular;  Laterality: Bilateral;  ? COLONOSCOPY WITH PROPOFOL N/A 12/09/2020  ? Procedure: COLONOSCOPY WITH PROPOFOL;  Surgeon: Gatha Mayer, MD;  Location: [facility] ENDOSCOPY;  Service: Endoscopy;  Laterality: N/A;  ? ESOPHAGOGASTRODUODENOSCOPY (EGD) WITH PROPOFOL N/A 12/09/2020  ? Procedure: ESOPHAGOGASTRODUODENOSCOPY (EGD) WITH PROPOFOL;  Surgeon: Gatha Mayer, MD;  Location: [facility] ENDOSCOPY;  Service: Endoscopy;  Laterality: N/A;  ? EYE SURGERY    ? FINGER SURGERY Left   ? RING FINGER  ? IR THORACENTESIS ASP PLEURAL SPACE W/IMG GUIDE  12/13/2020  ? OPEN REDUCTION  INTERNAL FIXATION (ORIF) DISTAL RADIAL FRACTURE Left 10/05/2020  ? Procedure: OPEN REDUCTION INTERNAL FIXATION (ORIF) DISTAL RADIAL FRACTURE;  Surgeon: Shona Needles, MD;  Location: [place];  Service: Orthopedics;  Laterality: Left;  supraclavicular block  ? POLYPECTOMY  12/09/2020  ? Procedure: POLYPECTOMY;  Surgeon: Gatha Mayer, MD;  Location: [facility] ENDOSCOPY;  Service: Endoscopy;;  ? SPINAL FUSION  08/10/2021  ? T10-L2  ? TUBAL LIGATION    ? ?Family History:  ?Family History  ?Problem Relation Age of Onset  ? Hyperlipidemia Mother   ? Hypertension Mother   ? Diabetes Mother   ? COPD Father   ? Depression Sister   ? Suicidality Brother   ? Drug abuse Brother   ? Healthy Daughter   ? Healthy Daughter   ? Healthy Daughter   ? Colon cancer Neg Hx   ? Celiac disease Neg Hx   ? Inflammatory bowel disease Neg Hx   ? ? ?Social History:  ?Social History  ? ?Substance and Sexual Activity  ?Alcohol Use No  ?   ?Social History  ? ?Substance and Sexual Activity  ?Drug Use No  ?  ?Social History  ? ?Socioeconomic History  ? Marital status: Married  ?  Spouse name: Not on file  ? Number of children: 3  ? Years of education: 69  ? Highest education level: Not on file  ?Occupational History  ?  Not on file  ?Tobacco Use  ? Smoking status: Former  ?  Packs/day: 0.10  ?  Years: 1.00  ?  Pack years: 0.10  ?  Types: Cigarettes  ?  Quit date: 32  ?  Years since quitting: 41.3  ? Smokeless tobacco: Never  ? Tobacco comments:  ?  for a short period  ?Vaping Use  ? Vaping Use: Never used  ?Substance and Sexual Activity  ? Alcohol use: No  ? Drug use: No  ? Sexual activity: Not on file  ?Other Topics Concern  ? Not on file  ?Social History Narrative  ? Right handed  ? Caffeine use: coffee daily  ? ?Social Determinants of Health  ? ?Financial Resource Strain: Not on file  ?Food Insecurity: Not on file  ?Transportation Needs: Not on file  ?Physical Activity: Not on file  ?Stress: Not on file  ?Social Connections: Not on file   ? ?Additional Social History:  ?  ?  ?  ?  ?  ?  ?  ?  ?  ?  ?  ? ?Sleep: Good ? ?Appetite:  Fair ? ?Current Medications: ?Current Facility-Administered Medications  ?Medication Dose Route Frequency Provider Last Rate Last Admin  ? acetaminophen (TYLENOL) tablet 650 mg  650 mg Oral Q6H PRN Sherlon Handing, NP      ? alum & mag hydroxide-simeth (MAALOX/MYLANTA) 200-200-20 MG/5ML suspension 30 mL  30 mL Oral Q4H PRN Waldon Merl F, NP      ? amLODipine (NORVASC) tablet 10 mg  10 mg Oral Daily Parks Ranger, DO   10 mg at 01/26/22 1003  ? buPROPion (WELLBUTRIN XL) 24 hr tablet 150 mg  150 mg Oral Daily Waldon Merl F, NP   150 mg at 01/26/22 1003  ? cholecalciferol (VITAMIN D3) tablet 2,000 Units  2,000 Units Oral Daily Waldon Merl F, NP   2,000 Units at 01/26/22 1002  ? dorzolamide (TRUSOPT) 2 % ophthalmic solution 1 drop  1 drop Left Eye BID Waldon Merl F, NP   1 drop at 01/26/22 1004  ? escitalopram (LEXAPRO) tablet 10 mg  10 mg Oral QPC breakfast Parks Ranger, DO   10 mg at 01/26/22 9675  ? feeding supplement (ENSURE ENLIVE / ENSURE PLUS) liquid 237 mL  237 mL Oral TID WC Parks Ranger, DO   237 mL at 01/26/22 1149  ? fluorometholone (FML) 0.1 % ophthalmic suspension 1 drop  1 drop Right Eye BID Waldon Merl F, NP   1 drop at 01/26/22 1005  ? haloperidol (HALDOL) tablet 0.5 mg  0.5 mg Oral BH-[id number] Parks Ranger, DO   0.5 mg at 01/26/22 9163  ? ketorolac (ACULAR) 0.5 % ophthalmic solution 1 drop  1 drop Right Eye QHS Renda Rolls, RPH   1 drop at 01/25/22 2220  ? latanoprost (XALATAN) 0.005 % ophthalmic solution 1 drop  1 drop Both Eyes QHS Waldon Merl F, NP   1 drop at 01/25/22 2229  ? lisinopril (ZESTRIL) tablet 10 mg  10 mg Oral Daily Waldon Merl F, NP   10 mg at 01/26/22 1002  ? LORazepam (ATIVAN) tablet 0.5 mg  0.5 mg Oral BID PRN Sherlon Handing, NP      ? magnesium hydroxide (MILK OF MAGNESIA) suspension 30 mL  30 mL Oral  Daily PRN Waldon Merl F, NP      ? magnesium oxide (MAG-OX) tablet 400 mg  400 mg Oral Q breakfast Waldon Merl F,  NP   400 mg at 01/26/22 0834  ? metFORMIN (GLUCOPHAGE-XR) 24 hr tablet 500 mg  500 mg Oral Q breakfast Waldon Merl F, NP   500 mg at 01/26/22 9169  ? multivitamin with minerals tablet 1 tablet  1 tablet Oral Q breakfast Sherlon Handing, NP   1 tablet at 01/26/22 4503  ? pantoprazole (PROTONIX) EC tablet 20 mg  20 mg Oral Daily Parks Ranger, DO   20 mg at 01/26/22 1003  ? predniSONE (DELTASONE) tablet 2.5 mg  2.5 mg Oral Q breakfast Parks Ranger, DO   2.5 mg at 01/26/22 8882  ? ? ?Lab Results:  ?Results for orders placed or performed during the hospital encounter of 01/22/22 (from the past 48 hour(s))  ?Glucose, capillary     Status: Abnormal  ? Collection Time: 01/24/22  3:02 PM  ?Result Value Ref Range  ? Glucose-Capillary 177 (H) 70 - 99 mg/dL  ?  Comment: Glucose reference range applies only to samples taken after fasting for at least 8 hours.  ? ? ?Blood Alcohol level:  ?Lab Results  ?Component Value Date  ? ETH <10 01/22/2022  ? ? ?Metabolic Disorder Labs: ?Lab Results  ?Component Value Date  ? HGBA1C 7.4 (H) 11/29/2021  ? MPG 102.54 06/26/2021  ? MPG 102.54 06/26/2021  ? ?No results found for: PROLACTIN ?Lab Results  ?Component Value Date  ? CHOL 251 (H) 11/29/2021  ? TRIG 214 (H) 11/29/2021  ? HDL 55 11/29/2021  ? CHOLHDL 4.6 (H) 11/29/2021  ? VLDL 30 06/26/2021  ? LDLCALC 157 (H) 11/29/2021  ? LDLCALC 188 (H) 06/26/2021  ? ? ?Physical Findings: ?AIMS:  , ,  ,  ,    ?CIWA:    ?COWS:    ? ?Musculoskeletal: ?Strength & Muscle Tone: within normal limits ?Gait & Station: unable to stand ?Patient leans: N/A ? ?Psychiatric Specialty Exam: ? ?Presentation  ?General Appearance: Appropriate for Environment ? ?Eye Contact:Good ? ?Speech:Clear and Coherent ? ?Speech Volume:Normal ? ?Handedness:No data recorded ? ?Mood and Affect   ?Mood:Euthymic ? ?Affect:Congruent ? ? ?Thought Process  ?Thought Processes:No data recorded ?Descriptions of Associations:No data recorded ?Orientation:Full (Time, Place and Person) ? ?Thought Content:-- (My husband says I am confused) ? ?History of Schizoph

## 2022-01-26 NOTE — Progress Notes (Signed)
Patient remains on 1:1 r/t fall due to unsteady gait. Will continue to monitor with Q 15 minute safety checks. ?

## 2022-01-26 NOTE — Progress Notes (Signed)
Patient continues on 1:1 r/t fall risk.d/t unsteady gait. Patient is disoriented to time, place and situation. Patient stated that she did not sleep last night. Patient denies SI/HI/ AVH. Patient also denies depression and anxiety. Patient ate breakfast this morning in the day with poor appetite. Patient complaint with medications. Patient is currently resting her room. Will continue to monitor with Q 15 minutes safety checks. ?

## 2022-01-26 NOTE — Plan of Care (Signed)
Patient Calm and Cooperative during assessment. Patient presents with an euthymic mood. Patient oriented to self.  Patient denies SI/HI/AVH "I am not seeing those things anymore".  Patient did comply with scheduled medications.   Patient did participate in therapeutic milieu.  Patient gait continues to be unsteady. 1:1 safety tech in place.  Q 15 minute safety rounding in place.  Will continue plan of care.   ? ? ?Problem: Education: ?Goal: Knowledge of [facility] Education information/materials will improve ?Outcome: Progressing ?Goal: Emotional status will improve ?Outcome: Progressing ?Goal: Mental status will improve ?Outcome: Progressing ?  ?

## 2022-01-27 LAB — GLUCOSE, CAPILLARY: Glucose-Capillary: 113 mg/dL — ABNORMAL HIGH (ref 70–99)

## 2022-01-27 NOTE — Progress Notes (Signed)
Pt in chair watching TV, gait belt intact and front wheel walker in reach. Pt denies SI/HI and AVH. Pt is still delusional about having an affair with an 89 and he has gotten her pregnant. Pt is also paranoid about taking her medications. Pt encouraged to drink fluids and to eat. She will answer questions, but does not dialogue otherwise. Pt clean and dry. ?

## 2022-01-27 NOTE — Progress Notes (Signed)
[facility] MD Progress Note ? ?01/27/2022 11:14 AM ?Mckenzie [name]  ?MRN:  [id number] ?Subjective:  ?4/23 ?Patient reports feeling well today.  Asks "when am I going home?."  She intends to live with her husband.  Denies that he is cheating on her and recognizes "that was just my hallucinations."  Denies hallucinations or paranoia today.  Patient has not sustained any falls.  Denies pain or constipation denies depression or anxiety symptoms.  Did not have any visitors today.  She did finally sleep well last night.  ? ?4/22 ?Nishka remarks "I am feeling fine today."  Says that her daughter is being buried today, who passed away at the age of 77.  Also reports that she has lost a 77 year old son in the past, and 2 other twin sons.  Says that she used to hear voices but cannot remember when she last did.  States that her husband is still living with a 77 year old girl, and that they now have a little baby together.  "I went to go see her at her house yesterday."  "I am happy for him."  Insist that she will be discharging today.  Patient has been calm and pleasant on the unit. Sleeping well. No safety concerns.  ? ?Principal Problem: Hallucinations ?Diagnosis: Principal Problem: ?  Hallucinations ? ?Total Time spent with patient: 26  minutes ? ?Past Psychiatric History: Treated by Dr. Modesta Messing last year for depression. ? ?Past Medical History:  ?Past Medical History:  ?Diagnosis Date  ? Anemia   ? low iron  ? Arthritis   ? Depression   ? Diabetes ([place]) 11/30/2021  ? High cholesterol   ? Hypertension   ? Retinal micro-aneurysm of right eye   ? Stroke [facility])   ? 2 or 3 mini strokes  ?  ?Past Surgical History:  ?Procedure Laterality Date  ? ARTERY BIOPSY Bilateral 12/15/2020  ? Procedure: BILATERAL TEMPORAL ARTERY BIOPSIES;  Surgeon: Angelia Mould, MD;  Location: [facility] OR;  Service: Vascular;  Laterality: Bilateral;  ? COLONOSCOPY WITH PROPOFOL N/A 12/09/2020  ? Procedure: COLONOSCOPY WITH PROPOFOL;  Surgeon: Gatha Mayer, MD;  Location: [facility] ENDOSCOPY;  Service: Endoscopy;  Laterality: N/A;  ? ESOPHAGOGASTRODUODENOSCOPY (EGD) WITH PROPOFOL N/A 12/09/2020  ? Procedure: ESOPHAGOGASTRODUODENOSCOPY (EGD) WITH PROPOFOL;  Surgeon: Gatha Mayer, MD;  Location: [facility] ENDOSCOPY;  Service: Endoscopy;  Laterality: N/A;  ? EYE SURGERY    ? FINGER SURGERY Left   ? RING FINGER  ? IR THORACENTESIS ASP PLEURAL SPACE W/IMG GUIDE  12/13/2020  ? OPEN REDUCTION INTERNAL FIXATION (ORIF) DISTAL RADIAL FRACTURE Left 10/05/2020  ? Procedure: OPEN REDUCTION INTERNAL FIXATION (ORIF) DISTAL RADIAL FRACTURE;  Surgeon: Shona Needles, MD;  Location: [place];  Service: Orthopedics;  Laterality: Left;  supraclavicular block  ? POLYPECTOMY  12/09/2020  ? Procedure: POLYPECTOMY;  Surgeon: Gatha Mayer, MD;  Location: [facility] ENDOSCOPY;  Service: Endoscopy;;  ? SPINAL FUSION  08/10/2021  ? T10-L2  ? TUBAL LIGATION    ? ?Family History:  ?Family History  ?Problem Relation Age of Onset  ? Hyperlipidemia Mother   ? Hypertension Mother   ? Diabetes Mother   ? COPD Father   ? Depression Sister   ? Suicidality Brother   ? Drug abuse Brother   ? Healthy Daughter   ? Healthy Daughter   ? Healthy Daughter   ? Colon cancer Neg Hx   ? Celiac disease Neg Hx   ? Inflammatory bowel disease Neg Hx   ? ? ?  Social History:  ?Social History  ? ?Substance and Sexual Activity  ?Alcohol Use No  ?   ?Social History  ? ?Substance and Sexual Activity  ?Drug Use No  ?  ?Social History  ? ?Socioeconomic History  ? Marital status: Married  ?  Spouse name: Not on file  ? Number of children: 3  ? Years of education: 17  ? Highest education level: Not on file  ?Occupational History  ? Not on file  ?Tobacco Use  ? Smoking status: Former  ?  Packs/day: 0.10  ?  Years: 1.00  ?  Pack years: 0.10  ?  Types: Cigarettes  ?  Quit date: 77  ?  Years since quitting: 77  ? Smokeless tobacco: Never  ? Tobacco comments:  ?  for a short period  ?Vaping Use  ? Vaping Use: Never used  ?Substance and Sexual  Activity  ? Alcohol use: No  ? Drug use: No  ? Sexual activity: Not on file  ?Other Topics Concern  ? Not on file  ?Social History Narrative  ? Right handed  ? Caffeine use: coffee daily  ? ?Social Determinants of Health  ? ?Financial Resource Strain: Not on file  ?Food Insecurity: Not on file  ?Transportation Needs: Not on file  ?Physical Activity: Not on file  ?Stress: Not on file  ?Social Connections: Not on file  ? ?Additional Social History:  ?  ?  ?  ?  ?  ?  ?  ?  ?  ?  ?  ? ?Sleep: Good ? ?Appetite:  Fair ? ?Current Medications: ?Current Facility-Administered Medications  ?Medication Dose Route Frequency Provider Last Rate Last Admin  ? acetaminophen (TYLENOL) tablet 650 mg  650 mg Oral Q6H PRN Sherlon Handing, NP      ? alum & mag hydroxide-simeth (MAALOX/MYLANTA) 200-200-20 MG/5ML suspension 30 mL  30 mL Oral Q4H PRN Waldon Merl F, NP      ? amLODipine (NORVASC) tablet 10 mg  10 mg Oral Daily Parks Ranger, DO   10 mg at 01/27/22 0944  ? buPROPion (WELLBUTRIN XL) 24 hr tablet 150 mg  150 mg Oral Daily Waldon Merl F, NP   150 mg at 01/27/22 6269  ? cholecalciferol (VITAMIN D3) tablet 2,000 Units  2,000 Units Oral Daily Sherlon Handing, NP   2,000 Units at 01/27/22 4854  ? dorzolamide (TRUSOPT) 2 % ophthalmic solution 1 drop  1 drop Left Eye BID Waldon Merl F, NP   1 drop at 01/27/22 0948  ? escitalopram (LEXAPRO) tablet 10 mg  10 mg Oral QPC breakfast Parks Ranger, DO   10 mg at 01/27/22 6270  ? feeding supplement (ENSURE ENLIVE / ENSURE PLUS) liquid 237 mL  237 mL Oral TID WC Parks Ranger, DO   237 mL at 01/27/22 1006  ? fluorometholone (FML) 0.1 % ophthalmic suspension 1 drop  1 drop Right Eye BID Waldon Merl F, NP   1 drop at 01/27/22 3500  ? haloperidol (HALDOL) tablet 0.5 mg  0.5 mg Oral BH-[id number] Parks Ranger, DO   0.5 mg at 01/27/22 9381  ? ketorolac (ACULAR) 0.5 % ophthalmic solution 1 drop  1 drop Right Eye QHS Renda Rolls, RPH    1 drop at 01/26/22 2121  ? latanoprost (XALATAN) 0.005 % ophthalmic solution 1 drop  1 drop Both Eyes QHS Sherlon Handing, NP   1 drop at 01/26/22 2136  ? lisinopril (ZESTRIL) tablet 10 mg  10 mg Oral Daily Waldon Merl F, NP   10 mg at 01/27/22 6578  ? LORazepam (ATIVAN) tablet 0.5 mg  0.5 mg Oral BID PRN Sherlon Handing, NP      ? magnesium hydroxide (MILK OF MAGNESIA) suspension 30 mL  30 mL Oral Daily PRN Waldon Merl F, NP      ? magnesium oxide (MAG-OX) tablet 400 mg  400 mg Oral Q breakfast Waldon Merl F, NP   400 mg at 01/27/22 4696  ? metFORMIN (GLUCOPHAGE-XR) 24 hr tablet 500 mg  500 mg Oral Q breakfast Waldon Merl F, NP   500 mg at 01/27/22 0930  ? multivitamin with minerals tablet 1 tablet  1 tablet Oral Q breakfast Sherlon Handing, NP   1 tablet at 01/27/22 0935  ? pantoprazole (PROTONIX) EC tablet 20 mg  20 mg Oral Daily Parks Ranger, DO   20 mg at 01/27/22 2952  ? predniSONE (DELTASONE) tablet 2.5 mg  2.5 mg Oral Q breakfast Parks Ranger, DO   2.5 mg at 01/27/22 0932  ? ? ?Lab Results:  ?No results found for this or any previous visit (from the past 48 hour(s)). ? ? ?Blood Alcohol level:  ?Lab Results  ?Component Value Date  ? ETH <10 01/22/2022  ? ? ?Metabolic Disorder Labs: ?Lab Results  ?Component Value Date  ? HGBA1C 7.4 (H) 11/29/2021  ? MPG 102.54 06/26/2021  ? MPG 102.54 06/26/2021  ? ?No results found for: PROLACTIN ?Lab Results  ?Component Value Date  ? CHOL 251 (H) 11/29/2021  ? TRIG 214 (H) 11/29/2021  ? HDL 55 11/29/2021  ? CHOLHDL 4.6 (H) 11/29/2021  ? VLDL 30 06/26/2021  ? LDLCALC 157 (H) 11/29/2021  ? LDLCALC 188 (H) 06/26/2021  ? ? ?Physical Findings: ?AIMS:  , ,  ,  ,    ?CIWA:    ?COWS:    ? ?Musculoskeletal: ?Strength & Muscle Tone: within normal limits ?Gait & Station: unable to stand ?Patient leans: N/A ? ?Psychiatric Specialty Exam: ? ?Presentation  ?General Appearance: Appropriate for Environment ? ?Eye  Contact:Good ? ?Speech:Clear and Coherent ? ?Speech Volume:Normal ? ?Handedness:No data recorded ? ?Mood and Affect  ?Mood:Euthymic ? ?Affect:Congruent ? ? ?Thought Process  ?Thought Processes:No data recorded ?Descriptions of As

## 2022-01-27 NOTE — Progress Notes (Signed)
Pt is A & O to self and place. Denies SI, HI, AVH and pain when assessed. Reports she slept well last night with poor appetite "I don't really like the food but I eat little bit". She reports decrease in auditory and visual hallucinations "I have not heard people talking to me or seeing anything since last week". However, pt remains delusional /preoccupied, accusing her husband of being with a 77 y/o female. Showered, clothes changed with assigned [facility] staff's assistance. Pt ate a bowl of cereal for breakfast and drank 237 ml of Ensure. Verbal education provided on all medications and effects monitored. ?Pt remains on 1:1 observation and fall precaution without incident. Assigned 1:1 MHT staff in attendance at all times. Emotional support and reassurance provided to pt. She is medication compliant and denies discomfort and this time.  ?

## 2022-01-27 NOTE — Plan of Care (Signed)
Patient Calm and Cooperative during shift.  Pt presents Flat. Patient denies SI/HI/Avh and contracts for safety.  Patient denies Depression and Anxiety during shift. Patient did participate in therapeutic milieu. Patient did have night snack. Patient did comply with scheduled meds. 1:1 monitor in place. Q 15 minute safety checks continue.  Will continue plan of care.   ? ? ?Problem: Education: ?Goal: Knowledge of  General Education information/materials will improve ?Outcome: Progressing ?Goal: Mental status will improve ?Outcome: Progressing ?  ?

## 2022-01-27 NOTE — Group Note (Signed)
LCSW Group Therapy Note ? ?Group Date: 01/27/2022 ?Start Time: 1300 ?End Time: 1400 ? ? ?Type of Therapy and Topic:  Group Therapy - How To Cope with Nervousness about Discharge  ? ?Participation Level:  Active  ? ?Description of Group ?This process group involved identification of patients' feelings about discharge. Some of them are scheduled to be discharged soon, while others are new admissions, but each of them was asked to share thoughts and feelings surrounding discharge from the hospital. One common theme was that they are excited at the prospect of going home, while another was that many of them are apprehensive about sharing why they were hospitalized. Patients were given the opportunity to discuss these feelings with their peers in preparation for discharge. ? ?Therapeutic Goals ? ?Patient will identify their overall feelings about pending discharge. ?Patient will think about how they might proactively address issues that they believe will once again arise once they get home (i.e. with parents). ?Patients will participate in discussion about having hope for change. ? ? ?Summary of Patient Progress:   ?Patient was present for the entirety of the group session. Patient was an active listener and participated in the topic of discussion, provided helpful advice to others, and added nuance to topic of conversation. Patient shared that she plans on going home to her husband. States that her daughters are keeping her active; taking her shopping and getting her to walk. States her mobility has improved since being here.  ? ? ?Therapeutic Modalities ?Cognitive Behavioral Therapy ? ? ?Durenda Hurt, LCSWA ?01/27/2022  2:39 PM   ?

## 2022-01-27 NOTE — Progress Notes (Signed)
Pt in bed and asleep, but alarm activated. ?

## 2022-01-27 NOTE — Progress Notes (Addendum)
Pt noted with increased paranoia, confusion and irritability post conversation with husband. Believes staff went to her home and killed her grandchild. "I thought y'all were my friends but if y'all can go to my home and kill my grandchild then I don't want to be here. I don't want anything from you guys either. I'm not eating or drinking here anymore because I know y'all will put something in it". Required increased verbal redirections to use the bathroom because she believes "shooters were going to shoot her through the commodes and snipers were out in the courtyard this evening as well. Pt declined her scheduled evening dose of Haldol and ensure on 3 separate attempts. Remains on 1:1 observation and fall precaution without incident. Assigned 1:1 MHT staff in attendance at all times. Continued support and reassurance provided to pt.  ?

## 2022-01-27 NOTE — Plan of Care (Signed)
  Problem: Education: Goal: Knowledge of [facility] Education information/materials will improve Outcome: Progressing Goal: Emotional status will improve Outcome: Progressing Goal: Mental status will improve Outcome: Progressing Goal: Verbalization of understanding the information provided will improve Outcome: Progressing   Problem: Activity: Goal: Interest or engagement in activities will improve Outcome: Progressing Goal: Sleeping patterns will improve Outcome: Progressing   Problem: Coping: Goal: Ability to verbalize frustrations and anger appropriately will improve Outcome: Progressing Goal: Ability to demonstrate self-control will improve Outcome: Progressing   Problem: Health Behavior/Discharge Planning: Goal: Identification of resources available to assist in meeting health care needs will improve Outcome: Progressing Goal: Compliance with treatment plan for underlying cause of condition will improve Outcome: Progressing   Problem: Physical Regulation: Goal: Ability to maintain clinical measurements within normal limits will improve Outcome: Progressing   Problem: Safety: Goal: Periods of time without injury will increase Outcome: Progressing   

## 2022-01-27 NOTE — Progress Notes (Signed)
Pt observed interacting well with peers in dayroom and courtyard this afternoon. Attempted to elope off unit X1 and was verbally redirected back from doors. Remains on 1:1 observation and fall precaution without incident. Assigned 1:1 [facility] staff in attendance at all times. Continued support and reassurance provided to pt. ble doors. Drank ensure, declined lunch "I'm not hungry right now". Continued support offered to pt. ?

## 2022-01-27 NOTE — Progress Notes (Signed)
Pt on close observation while up and in the dayroom. No complaints voiced, given snacks and ensure. ?

## 2022-01-28 ENCOUNTER — Encounter (INDEPENDENT_AMBULATORY_CARE_PROVIDER_SITE_OTHER): Payer: Medicare Other | Admitting: Ophthalmology

## 2022-01-28 NOTE — Progress Notes (Signed)
Pt in bed asleep, sitter at the bedside and bed alarm activated. ?

## 2022-01-28 NOTE — Progress Notes (Signed)
Pt still sitting in the recliner in the dayroom. No complaints voiced. She denies SI/HI and AVH, but she is confused about what I am asking her. Her husband left at 72 pm and he felt she was doing better. Pt encouraged to eat and drink. No delusional/paranoid comments made about her husband getting a 77 year old girl pregnant. Pt does not talk much, but pleasant on approach. Sitter/staff at the pt's side. ?

## 2022-01-28 NOTE — BH IP Treatment Plan (Signed)
Interdisciplinary Treatment and Diagnostic Plan Update ? ?01/28/2022 ?Time of Session: 9:30AM ?Mckenzie [name] ?MRN: [id number] ? ?Principal Diagnosis: Hallucinations ? ?Secondary Diagnoses: Principal Problem: ?  Hallucinations ? ? ?Current Medications:  ?Current Facility-Administered Medications  ?Medication Dose Route Frequency Provider Last Rate Last Admin  ? acetaminophen (TYLENOL) tablet 650 mg  650 mg Oral Q6H PRN Sherlon Handing, NP      ? alum & mag hydroxide-simeth (MAALOX/MYLANTA) 200-200-20 MG/5ML suspension 30 mL  30 mL Oral Q4H PRN Waldon Merl F, NP      ? amLODipine (NORVASC) tablet 10 mg  10 mg Oral Daily Parks Ranger, DO   10 mg at 01/27/22 0944  ? buPROPion (WELLBUTRIN XL) 24 hr tablet 150 mg  150 mg Oral Daily Waldon Merl F, NP   150 mg at 01/28/22 3220  ? cholecalciferol (VITAMIN D3) tablet 2,000 Units  2,000 Units Oral Daily Sherlon Handing, NP   2,000 Units at 01/28/22 2542  ? dorzolamide (TRUSOPT) 2 % ophthalmic solution 1 drop  1 drop Left Eye BID Waldon Merl F, NP   1 drop at 01/27/22 2125  ? escitalopram (LEXAPRO) tablet 10 mg  10 mg Oral QPC breakfast Parks Ranger, DO   10 mg at 01/28/22 7062  ? feeding supplement (ENSURE ENLIVE / ENSURE PLUS) liquid 237 mL  237 mL Oral TID WC Parks Ranger, DO   237 mL at 01/28/22 0800  ? fluorometholone (FML) 0.1 % ophthalmic suspension 1 drop  1 drop Right Eye BID Waldon Merl F, NP   1 drop at 01/27/22 2126  ? haloperidol (HALDOL) tablet 0.5 mg  0.5 mg Oral [id number] Parks Ranger, DO   0.5 mg at 01/28/22 5176  ? ketorolac (ACULAR) 0.5 % ophthalmic solution 1 drop  1 drop Right Eye QHS Renda Rolls, RPH   1 drop at 01/27/22 2137  ? latanoprost (XALATAN) 0.005 % ophthalmic solution 1 drop  1 drop Both Eyes QHS Waldon Merl F, NP   1 drop at 01/27/22 2128  ? lisinopril (ZESTRIL) tablet 10 mg  10 mg Oral Daily Waldon Merl F, NP   10 mg at 01/27/22 1607  ? LORazepam (ATIVAN)  tablet 0.5 mg  0.5 mg Oral BID PRN Sherlon Handing, NP      ? magnesium hydroxide (MILK OF MAGNESIA) suspension 30 mL  30 mL Oral Daily PRN Waldon Merl F, NP      ? magnesium oxide (MAG-OX) tablet 400 mg  400 mg Oral Q breakfast Waldon Merl F, NP   400 mg at 01/28/22 0827  ? metFORMIN (GLUCOPHAGE-XR) 24 hr tablet 500 mg  500 mg Oral Q breakfast Waldon Merl F, NP   500 mg at 01/28/22 0820  ? multivitamin with minerals tablet 1 tablet  1 tablet Oral Q breakfast Sherlon Handing, NP   1 tablet at 01/28/22 3710  ? pantoprazole (PROTONIX) EC tablet 20 mg  20 mg Oral Daily Parks Ranger, DO   20 mg at 01/28/22 6269  ? predniSONE (DELTASONE) tablet 2.5 mg  2.5 mg Oral Q breakfast Parks Ranger, DO   2.5 mg at 01/28/22 0820  ? ?PTA Medications: ?Medications Prior to Admission  ?Medication Sig Dispense Refill Last Dose  ? amLODipine (NORVASC) 5 MG tablet Take 1 tablet by mouth once daily 90 tablet 0   ? ARIPiprazole (ABILIFY) 2 MG tablet Take 2 mg by mouth every morning.     ? aspirin EC 81 MG  EC tablet Take 1 tablet (81 mg total) by mouth daily. X one month, then stop (Patient not taking: Reported on 01/22/2022) 30 tablet 0   ? Bromfenac Sodium 0.07 % SOLN Place 1 drop into the right eye every evening.     ? buPROPion (WELLBUTRIN XL) 150 MG 24 hr tablet Take by mouth.     ? Calcium Carb-Cholecalciferol (CALCIUM+D3 PO) Take 2 tablets by mouth in the morning.     ? COMBIGAN 0.2-0.5 % ophthalmic solution Place 1 drop into both eyes every 12 (twelve) hours.     ? dorzolamide (TRUSOPT) 2 % ophthalmic solution Place 1 drop into the left eye 2 (two) times daily.     ? Ergocalciferol (VITAMIN D2) 50 MCG (2000 UT) TABS Take 1 tablet by mouth daily. 30 tablet 6   ? escitalopram (LEXAPRO) 20 MG tablet Take 1 tablet (20 mg total) by mouth daily. (Patient not taking: Reported on 01/22/2022) 90 tablet 1   ? fluorometholone (FML) 0.1 % ophthalmic suspension Place 1 drop into the right eye 2 (two)  times daily.     ? hydrOXYzine (ATARAX) 10 MG tablet One or two po at bedtime (Patient not taking: Reported on 01/22/2022) 60 tablet 5   ? latanoprost (XALATAN) 0.005 % ophthalmic solution Place 1 drop into both eyes at bedtime.     ? lisinopril (ZESTRIL) 10 MG tablet Take 1 tablet (10 mg total) by mouth daily. 30 tablet 2   ? LORazepam (ATIVAN) 0.5 MG tablet Take 1 tablet (0.5 mg total) by mouth 2 (two) times daily as needed for anxiety. 30 tablet 2   ? magnesium oxide (MAG-OX) 400 MG tablet Take 400 mg by mouth in the morning.     ? metFORMIN (GLUCOPHAGE-XR) 500 MG 24 hr tablet Take 1 tablet (500 mg total) by mouth daily with breakfast. 30 tablet 2   ? Multiple Vitamin (MULTIVITAMIN WITH MINERALS) TABS tablet Take 1 tablet by mouth in the morning. One A Day Multivitamin     ? OLANZapine (ZYPREXA) 2.5 MG tablet Take 2 tablets (5 mg total) by mouth at bedtime. (Patient not taking: Reported on 01/22/2022) 60 tablet 5   ? predniSONE (DELTASONE) 5 MG tablet Take 1 tablet (5 mg total) by mouth daily. 30 tablet 1   ? QUEtiapine (SEROQUEL) 25 MG tablet Take 1 tablet (25 mg total) by mouth at bedtime. (Patient not taking: Reported on 01/22/2022) 30 tablet 0   ? ? ?Patient Stressors: Marital or family conflict   ? ?Patient Strengths: Motivation for treatment/growth  ?Supportive family/friends  ? ?Treatment Modalities: Medication Management, Group therapy, Case management,  ?1 to 1 session with clinician, Psychoeducation, Recreational therapy. ? ? ?Physician Treatment Plan for Primary Diagnosis: Hallucinations ?Long Term Goal(s): Improvement in symptoms so as ready for discharge  ? ?Short Term Goals: Ability to identify changes in lifestyle to reduce recurrence of condition will improve ?Ability to verbalize feelings will improve ?Ability to disclose and discuss suicidal ideas ?Ability to demonstrate self-control will improve ?Ability to identify and develop effective coping behaviors will improve ?Ability to maintain clinical  measurements within normal limits will improve ?Compliance with prescribed medications will improve ?Ability to identify triggers associated with substance abuse/mental health issues will improve ? ?Medication Management: Evaluate patient's response, side effects, and tolerance of medication regimen. ? ?Therapeutic Interventions: 1 to 1 sessions, Unit Group sessions and Medication administration. ? ?Evaluation of Outcomes: Adequate for Discharge ? ?Physician Treatment Plan for Secondary Diagnosis: Principal Problem: ?  Hallucinations ? ?  Long Term Goal(s): Improvement in symptoms so as ready for discharge  ? ?Short Term Goals: Ability to identify changes in lifestyle to reduce recurrence of condition will improve ?Ability to verbalize feelings will improve ?Ability to disclose and discuss suicidal ideas ?Ability to demonstrate self-control will improve ?Ability to identify and develop effective coping behaviors will improve ?Ability to maintain clinical measurements within normal limits will improve ?Compliance with prescribed medications will improve ?Ability to identify triggers associated with substance abuse/mental health issues will improve    ? ?Medication Management: Evaluate patient's response, side effects, and tolerance of medication regimen. ? ?Therapeutic Interventions: 1 to 1 sessions, Unit Group sessions and Medication administration. ? ?Evaluation of Outcomes: Adequate for Discharge ? ? ?RN Treatment Plan for Primary Diagnosis: Hallucinations ?Long Term Goal(s): Knowledge of disease and therapeutic regimen to maintain health will improve ? ?Short Term Goals: Ability to remain free from injury will improve, Ability to verbalize frustration and anger appropriately will improve, Ability to demonstrate self-control, Ability to participate in decision making will improve, Ability to verbalize feelings will improve, Ability to identify and develop effective coping behaviors will improve, and Compliance with  prescribed medications will improve ? ?Medication Management: RN will administer medications as ordered by provider, will assess and evaluate patient's response and provide education to patient for prescribed

## 2022-01-28 NOTE — Progress Notes (Signed)
Physical Therapy Treatment ?Patient Details ?Name: Mckenzie [name] ?MRN: [id number] ?DOB: 06-12-1946 ?Today's Date: 01/28/2022 ? ? ?History of Present Illness 76 y/o female seen in geriatric behavioral unit for visual and auditory hallucinations and generalized weakness.  PMH includes: anemia, arthritis, HTN, CVA, spinal fusion, ORIF L radius, giant cell arteritis, spinal surgery with subsequent cognitive decline. ? ?  ?PT Comments  ? ? Pt ambulating with staff in hallway upon PT arrival; pt agreeable to walking some more (140 feet with RW CGA).  Pt appearing not as talkative today and had difficulty focusing to answer therapists questions; pt also had difficulty concentrating to follow cueing from therapist (compared to previous sessions).  Limited session per pt's level of participation.  Will continue to focus on strengthening, balance, and progressive functional mobility during hospitalization.  ?  ?Recommendations for follow up therapy are one component of a multi-disciplinary discharge planning process, led by the attending physician.  Recommendations may be updated based on patient status, additional functional criteria and insurance authorization. ? ?Follow Up Recommendations ? Home health PT ?  ?  ?Assistance Recommended at Discharge Intermittent Supervision/Assistance  ?Patient can return home with the following Assistance with cooking/housework;Direct supervision/assist for medications management;Assist for transportation;Help with stairs or ramp for entrance;A little help with walking and/or transfers;A little help with bathing/dressing/bathroom ?  ?Equipment Recommendations ? Rolling walker (2 wheels)  ?  ?Recommendations for Other Services   ? ? ?  ?Precautions / Restrictions Precautions ?Precautions: Fall ?Restrictions ?Weight Bearing Restrictions: No  ?  ? ?Mobility ? Bed Mobility ?  ?  ?  ?  ?  ?  ?  ?General bed mobility comments: Not tested during session ?  ? ?Transfers ?Overall transfer level:  Needs assistance ?Equipment used: Rolling walker (2 wheels) ?Transfers: Sit to/from Stand ?Sit to Stand: Supervision ?  ?  ?  ?  ?  ?General transfer comment: SBA for safety and to line up to surface prior to sitting ?  ? ?Ambulation/Gait ?Ambulation/Gait assistance: Min guard ?Gait Distance (Feet): 140 Feet ?Assistive device: Rolling walker (2 wheels) ?  ?Gait velocity: decreased ?  ?  ?General Gait Details: vc's to stay closer to RW and for longer step length required fairly consistently ? ? ?Stairs ?  ?  ?  ?  ?  ? ? ?Wheelchair Mobility ?  ? ?Modified Rankin (Stroke Patients Only) ?  ? ? ?  ?Balance Overall balance assessment: Needs assistance ?Sitting-balance support: No upper extremity supported, Feet supported ?Sitting balance-Leahy Scale: Good ?Sitting balance - Comments: steady sitting reaching within BOS ?  ?Standing balance support: Bilateral upper extremity supported, During functional activity, Reliant on assistive device for balance ?Standing balance-Leahy Scale: Fair ?Standing balance comment: steady ambulating with RW use ?  ?  ?  ?  ?  ?  ?  ?  ?  ?  ?  ?  ? ?  ?Cognition Arousal/Alertness: Awake/alert ?Behavior During Therapy: [facility] for tasks assessed/performed ?Overall Cognitive Status: Difficult to assess ?  ?  ?  ?  ?  ?  ?  ?  ?  ?  ?  ?  ?  ?  ?  ?  ?General Comments: Pt oriented at least to self (pt not very talkative today and had difficulty focusing when asked a question) ?  ?  ? ?  ?Exercises   ? ?  ?General Comments  Nursing cleared pt for participation in physical therapy. ?  ?  ? ?Pertinent Vitals/Pain Pain Assessment ?  Pain Assessment: No/denies pain  ? ? ?Home Living   ?  ?  ?  ?  ?  ?  ?  ?  ?  ?   ?  ?Prior Function    ?  ?  ?   ? ?PT Goals (current goals can now be found in the care plan section) Acute Rehab PT Goals ?Patient Stated Goal: go home ?PT Goal Formulation: With patient ?Time For Goal Achievement: 02/06/22 ?Potential to Achieve Goals: Good ?Progress towards PT goals:  Progressing toward goals ? ?  ?Frequency ? ? ? Min 2X/week ? ? ? ?  ?PT Plan Current plan remains appropriate  ? ? ?Co-evaluation   ?  ?  ?  ?  ? ?  ?AM-PAC PT "6 Clicks" Mobility   ?Outcome Measure ? Help needed turning from your back to your side while in a flat bed without using bedrails?: None ?Help needed moving from lying on your back to sitting on the side of a flat bed without using bedrails?: None ?Help needed moving to and from a bed to a chair (including a wheelchair)?: A Little ?Help needed standing up from a chair using your arms (e.g., wheelchair or bedside chair)?: A Little ?Help needed to walk in hospital room?: A Little ?Help needed climbing 3-5 steps with a railing? : A Little ?6 Click Score: 20 ? ?  ?End of Session Equipment Utilized During Treatment: Gait belt ?Activity Tolerance: Patient tolerated treatment well ?Patient left: in chair (in day room with sitter present) ?Nurse Communication: Mobility status;Precautions ?PT Visit Diagnosis: Unsteadiness on feet (R26.81);Difficulty in walking, not elsewhere classified (R26.2) ?  ? ? ?Time: 9326-7124 ?PT Time Calculation (min) (ACUTE ONLY): 10 min ? ?Charges:  $Therapeutic Exercise: 8-22 mins          ?          ?Leitha Bleak, PT ?01/28/22, 5:22 PM ? ? ?

## 2022-01-28 NOTE — Progress Notes (Signed)
Pt up x 2 to go to the bathroom with assistance. Pt incontinent  and diaper changed x 1. She is still delusional and confused. ?

## 2022-01-28 NOTE — Progress Notes (Signed)
Pt now in bed, sitter/staff at bedside with bed alarm activated. Pt napping, but easy to arouse. ?

## 2022-01-28 NOTE — Plan of Care (Signed)
  Problem: Education: Goal: Knowledge of  General Education information/materials will improve Outcome: Progressing Goal: Emotional status will improve Outcome: Progressing Goal: Mental status will improve Outcome: Progressing Goal: Verbalization of understanding the information provided will improve Outcome: Progressing   Problem: Activity: Goal: Interest or engagement in activities will improve Outcome: Progressing Goal: Sleeping patterns will improve Outcome: Progressing   Problem: Coping: Goal: Ability to verbalize frustrations and anger appropriately will improve Outcome: Progressing Goal: Ability to demonstrate self-control will improve Outcome: Progressing   Problem: Health Behavior/Discharge Planning: Goal: Identification of resources available to assist in meeting health care needs will improve Outcome: Progressing Goal: Compliance with treatment plan for underlying cause of condition will improve Outcome: Progressing   Problem: Physical Regulation: Goal: Ability to maintain clinical measurements within normal limits will improve Outcome: Progressing   Problem: Safety: Goal: Periods of time without injury will increase Outcome: Progressing   

## 2022-01-28 NOTE — Progress Notes (Signed)
[facility] MD Progress Note ? ?01/28/2022 12:42 PM ?Mckenzie [name]  ?MRN:  [id number] ?Subjective: Mckenzie [name] was seen on rounds today.  In reviewing her past notes it is apparent that she has very good days in which she is not agitated or delusional and then other days where she refuses her medication and makes untrue statements that are strange; such as staff went to her home to kill her daughter.  I believe the medications so far have helped with her auditory hallucinations and even her delusions to a certain point because she has denied that her husband has been having an affair with a 40 year old.  So, it is apparent that she is waxing and waning so we will continue to observe her and possibly go up on her Haldol if need be.  No evidence of EPS or TD.  She denies any side effects.  She still needs assistance with walking and transferring. ? ?Principal Problem: Hallucinations ?Diagnosis: Principal Problem: ?  Hallucinations ? ?Total Time spent with patient: 15 minutes ? ?Past Psychiatric History:  Treated by Dr. Modesta Messing last year for depression. ? ?Past Medical History:  ?Past Medical History:  ?Diagnosis Date  ? Anemia   ? low iron  ? Arthritis   ? Depression   ? Diabetes ([place]) 11/30/2021  ? High cholesterol   ? Hypertension   ? Retinal micro-aneurysm of right eye   ? Stroke [facility])   ? 2 or 3 mini strokes  ?  ?Past Surgical History:  ?Procedure Laterality Date  ? ARTERY BIOPSY Bilateral 12/15/2020  ? Procedure: BILATERAL TEMPORAL ARTERY BIOPSIES;  Surgeon: Angelia Mould, MD;  Location: [facility] OR;  Service: Vascular;  Laterality: Bilateral;  ? COLONOSCOPY WITH PROPOFOL N/A 12/09/2020  ? Procedure: COLONOSCOPY WITH PROPOFOL;  Surgeon: Gatha Mayer, MD;  Location: [facility] ENDOSCOPY;  Service: Endoscopy;  Laterality: N/A;  ? ESOPHAGOGASTRODUODENOSCOPY (EGD) WITH PROPOFOL N/A 12/09/2020  ? Procedure: ESOPHAGOGASTRODUODENOSCOPY (EGD) WITH PROPOFOL;  Surgeon: Gatha Mayer, MD;  Location: [facility] ENDOSCOPY;  Service: Endoscopy;   Laterality: N/A;  ? EYE SURGERY    ? FINGER SURGERY Left   ? RING FINGER  ? IR THORACENTESIS ASP PLEURAL SPACE W/IMG GUIDE  12/13/2020  ? OPEN REDUCTION INTERNAL FIXATION (ORIF) DISTAL RADIAL FRACTURE Left 10/05/2020  ? Procedure: OPEN REDUCTION INTERNAL FIXATION (ORIF) DISTAL RADIAL FRACTURE;  Surgeon: Shona Needles, MD;  Location: [place];  Service: Orthopedics;  Laterality: Left;  supraclavicular block  ? POLYPECTOMY  12/09/2020  ? Procedure: POLYPECTOMY;  Surgeon: Gatha Mayer, MD;  Location: [facility] ENDOSCOPY;  Service: Endoscopy;;  ? SPINAL FUSION  08/10/2021  ? T10-L2  ? TUBAL LIGATION    ? ?Family History:  ?Family History  ?Problem Relation Age of Onset  ? Hyperlipidemia Mother   ? Hypertension Mother   ? Diabetes Mother   ? COPD Father   ? Depression Sister   ? Suicidality Brother   ? Drug abuse Brother   ? Healthy Daughter   ? Healthy Daughter   ? Healthy Daughter   ? Colon cancer Neg Hx   ? Celiac disease Neg Hx   ? Inflammatory bowel disease Neg Hx   ? ? ?Social History:  ?Social History  ? ?Substance and Sexual Activity  ?Alcohol Use No  ?   ?Social History  ? ?Substance and Sexual Activity  ?Drug Use No  ?  ?Social History  ? ?Socioeconomic History  ? Marital status: Married  ?  Spouse name: Not on file  ?  Number of children: 3  ? Years of education: 65  ? Highest education level: Not on file  ?Occupational History  ? Not on file  ?Tobacco Use  ? Smoking status: Former  ?  Packs/day: 0.10  ?  Years: 1.00  ?  Pack years: 0.10  ?  Types: Cigarettes  ?  Quit date: 66  ?  Years since quitting: 41.3  ? Smokeless tobacco: Never  ? Tobacco comments:  ?  for a short period  ?Vaping Use  ? Vaping Use: Never used  ?Substance and Sexual Activity  ? Alcohol use: No  ? Drug use: No  ? Sexual activity: Not on file  ?Other Topics Concern  ? Not on file  ?Social History Narrative  ? Right handed  ? Caffeine use: coffee daily  ? ?Social Determinants of Health  ? ?Financial Resource Strain: Not on file  ?Food  Insecurity: Not on file  ?Transportation Needs: Not on file  ?Physical Activity: Not on file  ?Stress: Not on file  ?Social Connections: Not on file  ? ?Additional Social History:  ?  ?  ?  ?  ?  ?  ?  ?  ?  ?  ?  ? ?Sleep: Good ? ?Appetite:  Good ? ?Current Medications: ?Current Facility-Administered Medications  ?Medication Dose Route Frequency Provider Last Rate Last Admin  ? acetaminophen (TYLENOL) tablet 650 mg  650 mg Oral Q6H PRN Sherlon Handing, NP      ? alum & mag hydroxide-simeth (MAALOX/MYLANTA) 200-200-20 MG/5ML suspension 30 mL  30 mL Oral Q4H PRN Waldon Merl F, NP      ? amLODipine (NORVASC) tablet 10 mg  10 mg Oral Daily Parks Ranger, DO   10 mg at 01/27/22 0944  ? buPROPion (WELLBUTRIN XL) 24 hr tablet 150 mg  150 mg Oral Daily Waldon Merl F, NP   150 mg at 01/28/22 5809  ? cholecalciferol (VITAMIN D3) tablet 2,000 Units  2,000 Units Oral Daily Sherlon Handing, NP   2,000 Units at 01/28/22 9833  ? dorzolamide (TRUSOPT) 2 % ophthalmic solution 1 drop  1 drop Left Eye BID Waldon Merl F, NP   1 drop at 01/28/22 1122  ? escitalopram (LEXAPRO) tablet 10 mg  10 mg Oral QPC breakfast Parks Ranger, DO   10 mg at 01/28/22 8250  ? feeding supplement (ENSURE ENLIVE / ENSURE PLUS) liquid 237 mL  237 mL Oral TID WC Parks Ranger, DO   237 mL at 01/28/22 1123  ? fluorometholone (FML) 0.1 % ophthalmic suspension 1 drop  1 drop Right Eye BID Waldon Merl F, NP   1 drop at 01/28/22 1123  ? haloperidol (HALDOL) tablet 0.5 mg  0.5 mg Oral [id number] Parks Ranger, DO   0.5 mg at 01/28/22 5397  ? ketorolac (ACULAR) 0.5 % ophthalmic solution 1 drop  1 drop Right Eye QHS Renda Rolls, RPH   1 drop at 01/27/22 2137  ? latanoprost (XALATAN) 0.005 % ophthalmic solution 1 drop  1 drop Both Eyes QHS Waldon Merl F, NP   1 drop at 01/27/22 2128  ? lisinopril (ZESTRIL) tablet 10 mg  10 mg Oral Daily Waldon Merl F, NP   10 mg at 01/27/22 6734  ?  LORazepam (ATIVAN) tablet 0.5 mg  0.5 mg Oral BID PRN Waldon Merl F, NP      ? magnesium hydroxide (MILK OF MAGNESIA) suspension 30 mL  30 mL Oral Daily PRN Waldon Merl  F, NP      ? magnesium oxide (MAG-OX) tablet 400 mg  400 mg Oral Q breakfast Waldon Merl F, NP   400 mg at 01/28/22 0827  ? metFORMIN (GLUCOPHAGE-XR) 24 hr tablet 500 mg  500 mg Oral Q breakfast Waldon Merl F, NP   500 mg at 01/28/22 0820  ? multivitamin with minerals tablet 1 tablet  1 tablet Oral Q breakfast Sherlon Handing, NP   1 tablet at 01/28/22 6767  ? pantoprazole (PROTONIX) EC tablet 20 mg  20 mg Oral Daily Parks Ranger, DO   20 mg at 01/28/22 2094  ? predniSONE (DELTASONE) tablet 2.5 mg  2.5 mg Oral Q breakfast Parks Ranger, DO   2.5 mg at 01/28/22 0820  ? ? ?Lab Results:  ?Results for orders placed or performed during the hospital encounter of 01/22/22 (from the past 48 hour(s))  ?Glucose, capillary     Status: Abnormal  ? Collection Time: 01/27/22  7:58 PM  ?Result Value Ref Range  ? Glucose-Capillary 113 (H) 70 - 99 mg/dL  ?  Comment: Glucose reference range applies only to samples taken after fasting for at least 8 hours.  ? ? ?Blood Alcohol level:  ?Lab Results  ?Component Value Date  ? ETH <10 01/22/2022  ? ? ?Metabolic Disorder Labs: ?Lab Results  ?Component Value Date  ? HGBA1C 7.4 (H) 11/29/2021  ? MPG 102.54 06/26/2021  ? MPG 102.54 06/26/2021  ? ?No results found for: PROLACTIN ?Lab Results  ?Component Value Date  ? CHOL 251 (H) 11/29/2021  ? TRIG 214 (H) 11/29/2021  ? HDL 55 11/29/2021  ? CHOLHDL 4.6 (H) 11/29/2021  ? VLDL 30 06/26/2021  ? LDLCALC 157 (H) 11/29/2021  ? LDLCALC 188 (H) 06/26/2021  ? ? ?Physical Findings: ?AIMS:  , ,  ,  ,    ?CIWA:    ?COWS:    ? ?Musculoskeletal: ?Strength & Muscle Tone: within normal limits ?Gait & Station: unsteady ?Patient leans: N/A ? ?Psychiatric Specialty Exam: ? ?Presentation  ?General Appearance: Appropriate for Environment ? ?Eye  Contact:Good ? ?Speech:Clear and Coherent ? ?Speech Volume:Normal ? ?Handedness:No data recorded ? ?Mood and Affect  ?Mood:Euthymic ? ?Affect:Congruent ? ? ?Thought Process  ?Thought Processes:No data recorded ?Descriptions of

## 2022-01-28 NOTE — Progress Notes (Signed)
Recreation Therapy Notes ? ?Date: 01/28/2022 ? ?Time: 1:30pm    ? ?Location: Craft room   ? ?Behavioral response: N/A ?  ?Intervention Topic: Social Skills   ? ?Discussion/Intervention: ?Patient refused to attend group.  ? ?Clinical Observations/Feedback:  ?Patient refused to attend group.  ?  ?Judson Tsan LRT/CTRS ? ? ? ? ? ? ? ?Eleena Grater ?01/28/2022 3:37 PM ?

## 2022-01-28 NOTE — BHH Group Notes (Signed)
[facility] Group Notes:  (Nursing/MHT/Case Management/Adjunct) ? ?Date:  01/28/2022  ?Time:  10:00 AM  ? ?Type of Therapy:  Psychoeducational Skills ? ?Participation Level:  Did Not Attend ? ?Participation Quality:   N/A ? ?Affect:   N/A ? ?Cognitive:   N/A ? ?Insight:  None ? ?Engagement in Group:   N/A ? ?Modes of Intervention:   N/A ? ?Summary of Progress/Problems: ?The pt did not attend group. The pt was asleep. ?Juliette Alcide ?01/28/2022, 11:09 AM ?

## 2022-01-28 NOTE — Progress Notes (Signed)
Pt asleep in room , sitter at bedside and bed alarm activated. ?

## 2022-01-28 NOTE — Progress Notes (Signed)
Pt in recliner in the dayroom visiting with her husband visiting. Gait belt intact and sitter at side. Pt calm and composed. ?
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
## 2022-01-28 NOTE — Progress Notes (Signed)
1:1 Patient hourly rounding ? ?1100: Pt in dayroom calm, cooperative, and shows no signs of distress at this time, assigned sitter at pt's side. ? ?1200: Pt in dayroom calm, cooperative, and shows no signs of distress at this time, assigned sitter at pt's side. ? ?1300: Pt in dayroom calm, cooperative, and shows no signs of distress at this time, assigned sitter at pt's side. ? ?1400: Pt in dayroom calm, cooperative, and shows no signs of distress at this time, assigned sitter at pt's side. ? ?1500: Pt in dayroom calm, cooperative, and shows no signs of distress at this time, assigned sitter at pt's side. ? ?1600: Pt in dayroom calm, cooperative, and shows no signs of distress at this time, assigned sitter at pt's side. ? ?1700: Pt in dayroom calm, cooperative, and shows no signs of distress at this time, assigned sitter at pt's side. ? ?1800: Pt in dayroom calm, cooperative, and shows no signs of distress at this time, assigned sitter at pt's side. ? ?1900: Pt in dayroom calm, cooperative, and shows no signs of distress at this time, assigned sitter at pt's side. ?

## 2022-01-28 NOTE — Progress Notes (Signed)
Pt asleep in bed, and bed alarm activated. ?

## 2022-01-29 NOTE — Progress Notes (Signed)
Pt asleep in bed, sitter/staff at bedside and bed alarm activated. ?

## 2022-01-29 NOTE — Progress Notes (Signed)
Patient is currently in the day room among peers eating lunch, consume about 50% of lunch.Consume 100% of ensure supplement. Remain on 1:1 observation with assigned tech within reach.  ?

## 2022-01-29 NOTE — Progress Notes (Signed)
Patient assisted with ADL's and transferred out of bed to recliner with one assist. Patient remain alert and oriented, calm and cooperative during assessment. Denies SI, HI, AVH. Denies anxiety and depression. Denies pain or discomfort at this time. Ate breakfast in the day room among peers with poor appetite. Consume 100% of ensure supplement. Compliant with due medications and tolerated well. Remain on 1:1 observation and fall precaution with assigned staff in attendance. Patient remain safe on the unit with Q 15 minutes safety checks. ?

## 2022-01-29 NOTE — Progress Notes (Signed)
Pt. asleep in bed, with bed alarm activated ?

## 2022-01-29 NOTE — Progress Notes (Signed)
Pt in bed asleep, sitter/staff at bed side and bed alarm activated. ?

## 2022-01-29 NOTE — Progress Notes (Signed)
Pt asleep, staff at bedside and bed alarm activated. ?

## 2022-01-29 NOTE — Progress Notes (Signed)
Pt asleep in bed in room, sitter/staff at bedside and bed alarm activated. ?

## 2022-01-29 NOTE — Progress Notes (Signed)
[facility] MD Progress Note ? ?01/29/2022 10:41 AM ?Mckenzie [name]  ?MRN:  [id number] ?Subjective: Mckenzie [name] is seen on rounds today.  She is tolerating her medications and doing better.  She denies any side effects and she denies any auditory or visual hallucinations and she has not had any bizarre behavior the last 2 days.  She continues to need one-on-one support when she is awake because she is very unsteady on her feet.  PT recommends home health physical therapy which I ordered.  She is probably close to baseline and hopefully discharge tomorrow.  There is no evidence of EPS or TD.  She slept through the night but her appetite is still poor. ? ?Principal Problem: Hallucinations ?Diagnosis: Principal Problem: ?  Hallucinations ? ?Total Time spent with patient: 15 minutes ? ?Past Psychiatric History: None ? ?Past Medical History:  ?Past Medical History:  ?Diagnosis Date  ? Anemia   ? low iron  ? Arthritis   ? Depression   ? Diabetes ([place]) 11/30/2021  ? High cholesterol   ? Hypertension   ? Retinal micro-aneurysm of right eye   ? Stroke [facility])   ? 2 or 3 mini strokes  ?  ?Past Surgical History:  ?Procedure Laterality Date  ? ARTERY BIOPSY Bilateral 12/15/2020  ? Procedure: BILATERAL TEMPORAL ARTERY BIOPSIES;  Surgeon: Angelia Mould, MD;  Location: [facility] OR;  Service: Vascular;  Laterality: Bilateral;  ? COLONOSCOPY WITH PROPOFOL N/A 12/09/2020  ? Procedure: COLONOSCOPY WITH PROPOFOL;  Surgeon: Gatha Mayer, MD;  Location: [facility] ENDOSCOPY;  Service: Endoscopy;  Laterality: N/A;  ? ESOPHAGOGASTRODUODENOSCOPY (EGD) WITH PROPOFOL N/A 12/09/2020  ? Procedure: ESOPHAGOGASTRODUODENOSCOPY (EGD) WITH PROPOFOL;  Surgeon: Gatha Mayer, MD;  Location: [facility] ENDOSCOPY;  Service: Endoscopy;  Laterality: N/A;  ? EYE SURGERY    ? FINGER SURGERY Left   ? RING FINGER  ? IR THORACENTESIS ASP PLEURAL SPACE W/IMG GUIDE  12/13/2020  ? OPEN REDUCTION INTERNAL FIXATION (ORIF) DISTAL RADIAL FRACTURE Left 10/05/2020  ? Procedure: OPEN  REDUCTION INTERNAL FIXATION (ORIF) DISTAL RADIAL FRACTURE;  Surgeon: Shona Needles, MD;  Location: [facility];  Service: Orthopedics;  Laterality: Left;  supraclavicular block  ? POLYPECTOMY  12/09/2020  ? Procedure: POLYPECTOMY;  Surgeon: Gatha Mayer, MD;  Location: [facility] ENDOSCOPY;  Service: Endoscopy;;  ? SPINAL FUSION  08/10/2021  ? T10-L2  ? TUBAL LIGATION    ? ?Family History:  ?Family History  ?Problem Relation Age of Onset  ? Hyperlipidemia Mother   ? Hypertension Mother   ? Diabetes Mother   ? COPD Father   ? Depression Sister   ? Suicidality Brother   ? Drug abuse Brother   ? Healthy Daughter   ? Healthy Daughter   ? Healthy Daughter   ? Colon cancer Neg Hx   ? Celiac disease Neg Hx   ? Inflammatory bowel disease Neg Hx   ? ? ?Social History:  ?Social History  ? ?Substance and Sexual Activity  ?Alcohol Use No  ?   ?Social History  ? ?Substance and Sexual Activity  ?Drug Use No  ?  ?Social History  ? ?Socioeconomic History  ? Marital status: Married  ?  Spouse name: Not on file  ? Number of children: 3  ? Years of education: 29  ? Highest education level: Not on file  ?Occupational History  ? Not on file  ?Tobacco Use  ? Smoking status: Former  ?  Packs/day: 0.10  ?  Years: 1.00  ?  Pack  years: 0.10  ?  Types: Cigarettes  ?  Quit date: 61  ?  Years since quitting: 41.3  ? Smokeless tobacco: Never  ? Tobacco comments:  ?  for a short period  ?Vaping Use  ? Vaping Use: Never used  ?Substance and Sexual Activity  ? Alcohol use: No  ? Drug use: No  ? Sexual activity: Not on file  ?Other Topics Concern  ? Not on file  ?Social History Narrative  ? Right handed  ? Caffeine use: coffee daily  ? ?Social Determinants of Health  ? ?Financial Resource Strain: Not on file  ?Food Insecurity: Not on file  ?Transportation Needs: Not on file  ?Physical Activity: Not on file  ?Stress: Not on file  ?Social Connections: Not on file  ? ?Additional Social History:  ?  ?  ?  ?  ?  ?  ?  ?  ?  ?  ?  ? ?Sleep: Good ? ?Appetite:   Fair ? ?Current Medications: ?Current Facility-Administered Medications  ?Medication Dose Route Frequency Provider Last Rate Last Admin  ? acetaminophen (TYLENOL) tablet 650 mg  650 mg Oral Q6H PRN Sherlon Handing, NP      ? alum & mag hydroxide-simeth (MAALOX/MYLANTA) 200-200-20 MG/5ML suspension 30 mL  30 mL Oral Q4H PRN [place] Merl F, NP      ? amLODipine (NORVASC) tablet 10 mg  10 mg Oral Daily Parks Ranger, DO   10 mg at 01/29/22 9470  ? buPROPion (WELLBUTRIN XL) 24 hr tablet 150 mg  150 mg Oral Daily [place] Merl F, NP   150 mg at 01/29/22 9628  ? cholecalciferol (VITAMIN D3) tablet 2,000 Units  2,000 Units Oral Daily Sherlon Handing, NP   2,000 Units at 01/29/22 3662  ? dorzolamide (TRUSOPT) 2 % ophthalmic solution 1 drop  1 drop Left Eye BID Wal[place] Merl F, NP   1 drop at 01/29/22 0940  ? escitalopram (LEXAPRO) tablet 10 mg  10 mg Oral QPC breakfast Parks Ranger, DO   10 mg at 01/29/22 9476  ? feeding supplement (ENSURE ENLIVE / ENSURE PLUS) liquid 237 mL  237 mL Oral TID WC Parks Ranger, DO   237 mL at 01/29/22 5465  ? fluorometholone (FML) 0.1 % ophthalmic suspension 1 drop  1 drop Right Eye BID Wal[place] Merl F, NP   1 drop at 01/29/22 0940  ? haloperidol (HALDOL) tablet 0.5 mg  0.5 mg Oral [id number] Parks Ranger, DO   0.5 mg at 01/29/22 0354  ? ketorolac (ACULAR) 0.5 % ophthalmic solution 1 drop  1 drop Right Eye QHS Renda Rolls, RPH   1 drop at 01/28/22 2125  ? latanoprost (XALATAN) 0.005 % ophthalmic solution 1 drop  1 drop Both Eyes QHS Waldon Merl F, NP   1 drop at 01/28/22 2124  ? lisinopril (ZESTRIL) tablet 10 mg  10 mg Oral Daily [place] Merl F, NP   10 mg at 01/27/22 6568  ? LORazepam (ATIVAN) tablet 0.5 mg  0.5 mg Oral BID PRN Sherlon Handing, NP      ? magnesium hydroxide (MILK OF MAGNESIA) suspension 30 mL  30 mL Oral Daily PRN Waldon Merl F, NP      ? magnesium oxide (MAG-OX) tablet 400 mg  400 mg Oral Q  breakfast Waldon Merl F, NP   400 mg at 01/29/22 1275  ? metFORMIN (GLUCOPHAGE-XR) 24 hr tablet 500 mg  500 mg Oral Q breakfast Waldon Merl  F, NP   500 mg at 01/29/22 7209  ? multivitamin with minerals tablet 1 tablet  1 tablet Oral Q breakfast Sherlon Handing, NP   1 tablet at 01/29/22 4709  ? pantoprazole (PROTONIX) EC tablet 20 mg  20 mg Oral Daily Parks Ranger, DO   20 mg at 01/29/22 6283  ? predniSONE (DELTASONE) tablet 2.5 mg  2.5 mg Oral Q breakfast Parks Ranger, DO   2.5 mg at 01/29/22 6629  ? ? ?Lab Results:  ?Results for orders placed or performed during the hospital encounter of 01/22/22 (from the past 48 hour(s))  ?Glucose, capillary     Status: Abnormal  ? Collection Time: 01/27/22  7:58 PM  ?Result Value Ref Range  ? Glucose-Capillary 113 (H) 70 - 99 mg/dL  ?  Comment: Glucose reference range applies only to samples taken after fasting for at least 8 hours.  ? ? ?Blood Alcohol level:  ?Lab Results  ?Component Value Date  ? ETH <10 01/22/2022  ? ? ?Metabolic Disorder Labs: ?Lab Results  ?Component Value Date  ? HGBA1C 7.4 (H) 11/29/2021  ? MPG 102.54 06/26/2021  ? MPG 102.54 06/26/2021  ? ?No results found for: PROLACTIN ?Lab Results  ?Component Value Date  ? CHOL 251 (H) 11/29/2021  ? TRIG 214 (H) 11/29/2021  ? HDL 55 11/29/2021  ? CHOLHDL 4.6 (H) 11/29/2021  ? VLDL 30 06/26/2021  ? LDLCALC 157 (H) 11/29/2021  ? LDLCALC 188 (H) 06/26/2021  ? ? ?Physical Findings: ?AIMS:  , ,  ,  ,    ?CIWA:    ?COWS:    ? ?Musculoskeletal: ?Strength & Muscle Tone: within normal limits ?Gait & Station: unsteady ?Patient leans: N/A ? ?Psychiatric Specialty Exam: ? ?Presentation  ?General Appearance: Appropriate for Environment ? ?Eye Contact:Good ? ?Speech:Clear and Coherent ? ?Speech Volume:Normal ? ?Handedness:No data recorded ? ?Mood and Affect  ?Mood:Euthymic ? ?Affect:Congruent ? ? ?Thought Process  ?Thought Processes:No data recorded ?Descriptions of Associations:No data  recorded ?Orientation:Full (Time, Place and Person) ? ?Thought Content:-- (My husband says I am confused) ? ?History of Schizophrenia/Schizoaffective disorder:No data recorded ?Duration of Psychotic Symptoms:No data recor

## 2022-01-29 NOTE — BHH Group Notes (Signed)
[facility] Group Notes:  (Nursing/MHT/Case Management/Adjunct) ? ?Date:  01/29/2022  ?Time:  10:00 AM ? ?Type of Therapy:  Psychoeducational Skills ? ?Participation Level:  Active ? ?Participation Quality:  Appropriate ? ?Affect:  Appropriate ? ?Cognitive:  Appropriate ? ?Insight:  Appropriate and Limited ? ?Engagement in Group:  Developing/Improving and Engaged ? ?Modes of Intervention:  Discussion ? ?Summary of Progress/Problems: ?The pt attended group. The pt participated in group discussion. ?Juliette Alcide ?01/29/2022, 12:21 PM ?

## 2022-01-29 NOTE — Progress Notes (Signed)
Pt asleep,  and bed alarm activated. ?

## 2022-01-29 NOTE — BHH Counselor (Signed)
CSW contacted the following facilities for home health referral:  ? ?[facility], [phone] unable to staff patient ?[facility], [phone], pending referral ?[phone] no answer, left voice mail ?[facility]- [phone], no answer ?[facility]- unable to staff patient.  ? ?Sharmon Revere from [facility] stated she would get back to CSW regarding referral.  ? ?Camilo Mander [place], MSW, LCSW-A ?4/25/20234:15 PM  ? ?
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
## 2022-01-29 NOTE — Progress Notes (Signed)
Physical Therapy Treatment ?Patient Details ?Name: Mckenzie [name] ?MRN: [id number] ?DOB: 09/16/1946 ?Today's Date: 01/29/2022 ? ? ?History of Present Illness 76 y/o female seen in geriatric behavioral unit for visual and auditory hallucinations and generalized weakness.  PMH includes: anemia, arthritis, HTN, CVA, spinal fusion, ORIF L radius, giant cell arteritis, spinal surgery with subsequent cognitive decline. ? ?  ?PT Comments  ? ? Pt walking in hallway with staff assist upon PT arrival.  Pt able to ambulate another 50 feet with RW CGA before pt requesting to return to day room (pt declined to go to her room for therapy activities because she did not trust anyone with her in her room; NT reporting pt had already walked a lot on unit as well).  Performed sitting and reclined/semi supine LE ex's in recliner to improve overall strength for functional mobility (pt appeared to tolerate activities well).  Will continue to focus on strengthening, balance, and progressive functional mobility during hospitalization.  ?  ?Recommendations for follow up therapy are one component of a multi-disciplinary discharge planning process, led by the attending physician.  Recommendations may be updated based on patient status, additional functional criteria and insurance authorization. ? ?Follow Up Recommendations ? Home health PT ?  ?  ?Assistance Recommended at Discharge Intermittent Supervision/Assistance  ?Patient can return home with the following Assistance with cooking/housework;Direct supervision/assist for medications management;Assist for transportation;Help with stairs or ramp for entrance;A little help with walking and/or transfers;A little help with bathing/dressing/bathroom ?  ?Equipment Recommendations ? Rolling walker (2 wheels)  ?  ?Recommendations for Other Services   ? ? ?  ?Precautions / Restrictions Precautions ?Precautions: Fall ?Restrictions ?Weight Bearing Restrictions: No  ?  ? ?Mobility ? Bed Mobility ?  ?  ?   ?  ?  ?  ?  ?General bed mobility comments: Not tested during session ?  ? ?Transfers ?Overall transfer level: Needs assistance ?Equipment used: Rolling walker (2 wheels) ?Transfers: Sit to/from Stand ?Sit to Stand: Supervision ?  ?  ?  ?  ?  ?General transfer comment: SBA for safety to line up to surface prior to sitting ?  ? ?Ambulation/Gait ?Ambulation/Gait assistance: Min guard ?Gait Distance (Feet): 50 Feet ?Assistive device: Rolling walker (2 wheels) ?  ?Gait velocity: decreased ?  ?  ?General Gait Details: vc's to stay closer to RW and for longer step length ? ? ?Stairs ?  ?  ?  ?  ?  ? ? ?Wheelchair Mobility ?  ? ?Modified Rankin (Stroke Patients Only) ?  ? ? ?  ?Balance Overall balance assessment: Needs assistance ?Sitting-balance support: No upper extremity supported, Feet supported ?Sitting balance-Leahy Scale: Good ?Sitting balance - Comments: steady sitting reaching within BOS ?  ?Standing balance support: Bilateral upper extremity supported, During functional activity, Reliant on assistive device for balance ?Standing balance-Leahy Scale: Fair ?Standing balance comment: steady ambulating with RW use ?  ?  ?  ?  ?  ?  ?  ?  ?  ?  ?  ?  ? ?  ?Cognition Arousal/Alertness: Awake/alert ?Behavior During Therapy: [facility] for tasks assessed/performed ?Overall Cognitive Status: Difficult to assess ?  ?  ?  ?  ?  ?  ?  ?  ?  ?  ?  ?  ?  ?  ?  ?  ?General Comments: Pt oriented to at least self ?  ?  ? ?  ?Exercises General Exercises - Lower Extremity ?Ankle Circles/Pumps: AROM, Strengthening, Both, 10 reps, Supine ?[employer]  Sets: AROM, Strengthening, Both, 10 reps, Supine ?Gluteal Sets: AROM, Strengthening, Both, 10 reps, Supine ?Long Arc Quad: AROM, Strengthening, Both, 10 reps, Seated ?Heel Slides: AROM, Strengthening, Both, 10 reps, Supine ?Hip ABduction/ADduction: AROM, Strengthening, Both, 10 reps, Supine ?Hip Flexion/Marching: AROM, Strengthening, Both, 10 reps, Seated ? ?  ?General Comments   ?  ?  ? ?Pertinent  Vitals/Pain Pain Assessment ?Pain Assessment: No/denies pain  ? ? ?Home Living   ?  ?  ?  ?  ?  ?  ?  ?  ?  ?   ?  ?Prior Function    ?  ?  ?   ? ?PT Goals (current goals can now be found in the care plan section) Acute Rehab PT Goals ?Patient Stated Goal: go home ?PT Goal Formulation: With patient ?Time For Goal Achievement: 02/06/22 ?Potential to Achieve Goals: Good ?Progress towards PT goals: Progressing toward goals ? ?  ?Frequency ? ? ? Min 2X/week ? ? ? ?  ?PT Plan Current plan remains appropriate  ? ? ?Co-evaluation   ?  ?  ?  ?  ? ?  ?AM-PAC PT "6 Clicks" Mobility   ?Outcome Measure ? Help needed turning from your back to your side while in a flat bed without using bedrails?: None ?Help needed moving from lying on your back to sitting on the side of a flat bed without using bedrails?: None ?Help needed moving to and from a bed to a chair (including a wheelchair)?: A Little ?Help needed standing up from a chair using your arms (e.g., wheelchair or bedside chair)?: A Little ?Help needed to walk in hospital room?: A Little ?Help needed climbing 3-5 steps with a railing? : A Little ?6 Click Score: 20 ? ?  ?End of Session Equipment Utilized During Treatment: Gait belt ?Activity Tolerance: Patient tolerated treatment well ?Patient left: in chair (in day room with sitter present) ?Nurse Communication: Mobility status;Precautions ?PT Visit Diagnosis: Unsteadiness on feet (R26.81);Difficulty in walking, not elsewhere classified (R26.2) ?  ? ? ?Time: 1548-1600 ?PT Time Calculation (min) (ACUTE ONLY): 12 min ? ?Charges:  $Therapeutic Exercise: 8-22 mins          ?          ?Leitha Bleak, PT ?01/29/22, 4:24 PM ? ? ?

## 2022-01-30 MED ORDER — AMLODIPINE BESYLATE 10 MG PO TABS: 10.0000 mg | ORAL_TABLET | Freq: Every day | ORAL | 2 refills | Status: DC

## 2022-01-30 MED ORDER — PREDNISONE 2.5 MG PO TABS: 2.5000 mg | ORAL_TABLET | Freq: Every day | ORAL | 2 refills | Status: DC

## 2022-01-30 MED ORDER — BUPROPION HCL ER (XL) 150 MG PO TB24: 150.0000 mg | ORAL_TABLET | Freq: Every day | ORAL | 2 refills | Status: DC

## 2022-01-30 MED ORDER — ESCITALOPRAM OXALATE 10 MG PO TABS: 10.0000 mg | ORAL_TABLET | Freq: Every day | ORAL | 2 refills | Status: DC

## 2022-01-30 MED ORDER — HALOPERIDOL 0.5 MG PO TABS: 0.5000 mg | ORAL_TABLET | ORAL | 2 refills | Status: DC

## 2022-01-30 NOTE — Progress Notes (Signed)
Recreation Therapy Notes ? ?INPATIENT RECREATION TR PLAN ? ?Patient Details ?Name: Mckenzie [name] ?MRN: [id number] ?DOB: [birth year] ?Today's Date: 01/30/2022 ? ?Rec Therapy Plan ?Is patient appropriate for Therapeutic Recreation?: Yes ?Treatment times per week: at least 3 ?Estimated Length of Stay: 5-7 days ?TR Treatment/Interventions: Group participation (Comment) ? ?Discharge Criteria ?Pt will be discharged from therapy if:: Discharged ?Treatment plan/goals/alternatives discussed and agreed upon by:: Patient/family ? ?Discharge Summary ?Short term goals set: Patient will engage in groups without prompting or encouragement from LRT x3 group sessions within 5 recreation therapy group sessions ?Short term goals met: Complete ?Progress toward goals comments: Groups attended ?Which groups?: Social skills, Communication ?Reason goals not met: N/A ?Therapeutic equipment acquired: N/A ?Reason patient discharged from therapy: Discharge from hospital ?Pt/family agrees with progress & goals achieved: Yes ?Date patient discharged from therapy: 01/30/22 ? ? ?[name] ?01/30/2022, 3:33 PM ?

## 2022-01-30 NOTE — Care Management Important Message (Signed)
Important Message ? ?Patient Details  ?Name: Mckenzie [name] ?MRN: [id number] ?Date of Birth: 06-25-1946 ? ? ?Medicare Important Message Given:  Other (see comment) (Pt refused to sign IM paperwork at discharge but gave verbal permission regarding receipt of IM message) ? ? ? ? ?Shamecca Whitebread A [place], [place] ?01/30/2022, 9:48 AM ?

## 2022-01-30 NOTE — Progress Notes (Signed)
Recreation Therapy Notes ? ?Date: 01/30/2022  ?  ?Time: 1:25 PM   ?  ?Location: Courtyard  ?  ?Behavioral response: Appropriate ?  ?Intervention Topic:  Social Skills  ?  ?Discussion/Intervention:  ?Group content on today was focused on social skills. The group defined social skills and identified ways they use social skills. Patients expressed what obstacles they face when trying to be social. Participants described the importance of social skills. The group listed ways to improve social skills and reasons to improve social skills. Individuals had an opportunity to learn new and improve social skills as well as identify their weaknesses. ?  ?Clinical Observations/Feedback: ?Patient came to group and expressed past experiences with socializing. Patient left group early to discharge.  ?Mckenzie [name] LRT/CTRS  ?  ?  ?   ?  ? ? ? ? ? ? ? ?Mckenzie [name] ?01/30/2022 3:30 PM ?

## 2022-01-30 NOTE — Discharge Summary (Signed)
Physician Discharge Summary Note ? ?Patient:  Mckenzie [name] is an 76 y.o., female ?MRN:  [id number] ?DOB:  10-Sep-1946 ?Patient phone:  [phone] (home)  ?Patient address:   ?[place] ?[place] [address],  ?Total Time spent with patient: 1 hour ? ?Date of Admission:  01/22/2022 ?Date of Discharge: 01/30/2022 ? ?Reason for Admission:   Mckenzie [name] is a 76 year old white female who was referred for geriatric psychiatric admission by Dr. Casimiro Needle, psychiatry.  She had recently been worked up by a neurologist for auditory and visual hallucinations.  She does have a history of multiple CVAs, giant cell arteritis, visual and hearing impairment, recent back surgery with cognitive decline afterward.  She has been on prednisone 5 mg/day for her arthritis.  She has been on Lexapro, Seroquel, and Zyprexa.  Her Lexapro was not restarted on admission nor was her Seroquel.  She is currently on Abilify 2 mg/day.  She tells me that her husband is having an affair with a 74 year old which according to records is not true.  She has 3 grown daughters and currently resides in [place] with her husband.  She has been treated by Dr. Modesta Messing in the past for depression.  I do not believe that she has been psychiatrically admitted before.  She denies any suicidal ideation.  She does admit to being depressed because her husband is having an affair.  She is very labile and hard of hearing.  She laughs and cries during the interview. ? ?Principal Problem: Hallucinations ?Discharge Diagnoses: Principal Problem: ?  Hallucinations ? ? ?Past Psychiatric History: None ? ?Past Medical History:  ?Past Medical History:  ?Diagnosis Date  ? Anemia   ? low iron  ? Arthritis   ? Depression   ? Diabetes ([place]) 11/30/2021  ? High cholesterol   ? Hypertension   ? Retinal micro-aneurysm of right eye   ? Stroke [facility])   ? 2 or 3 mini strokes  ?  ?Past Surgical History:  ?Procedure Laterality Date  ? ARTERY BIOPSY Bilateral 12/15/2020  ? Procedure:  BILATERAL TEMPORAL ARTERY BIOPSIES;  Surgeon: Angelia Mould, MD;  Location: [facility] OR;  Service: Vascular;  Laterality: Bilateral;  ? COLONOSCOPY WITH PROPOFOL N/A 12/09/2020  ? Procedure: COLONOSCOPY WITH PROPOFOL;  Surgeon: Gatha Mayer, MD;  Location: [facility] ENDOSCOPY;  Service: Endoscopy;  Laterality: N/A;  ? ESOPHAGOGASTRODUODENOSCOPY (EGD) WITH PROPOFOL N/A 12/09/2020  ? Procedure: ESOPHAGOGASTRODUODENOSCOPY (EGD) WITH PROPOFOL;  Surgeon: Gatha Mayer, MD;  Location: [facility] ENDOSCOPY;  Service: Endoscopy;  Laterality: N/A;  ? EYE SURGERY    ? FINGER SURGERY Left   ? RING FINGER  ? IR THORACENTESIS ASP PLEURAL SPACE W/IMG GUIDE  12/13/2020  ? OPEN REDUCTION INTERNAL FIXATION (ORIF) DISTAL RADIAL FRACTURE Left 10/05/2020  ? Procedure: OPEN REDUCTION INTERNAL FIXATION (ORIF) DISTAL RADIAL FRACTURE;  Surgeon: Shona Needles, MD;  Location: [facility];  Service: Orthopedics;  Laterality: Left;  supraclavicular block  ? POLYPECTOMY  12/09/2020  ? Procedure: POLYPECTOMY;  Surgeon: Gatha Mayer, MD;  Location: [facility] ENDOSCOPY;  Service: Endoscopy;;  ? SPINAL FUSION  08/10/2021  ? T10-L2  ? TUBAL LIGATION    ? ?Family History:  ?Family History  ?Problem Relation Age of Onset  ? Hyperlipidemia Mother   ? Hypertension Mother   ? Diabetes Mother   ? COPD Father   ? Depression Sister   ? Suicidality Brother   ? Drug abuse Brother   ? Healthy Daughter   ? Healthy Daughter   ? Healthy Daughter   ?  Colon cancer Neg Hx   ? Celiac disease Neg Hx   ? Inflammatory bowel disease Neg Hx   ? ? ?Social History:  ?Social History  ? ?Substance and Sexual Activity  ?Alcohol Use No  ?   ?Social History  ? ?Substance and Sexual Activity  ?Drug Use No  ?  ?Social History  ? ?Socioeconomic History  ? Marital status: Married  ?  Spouse name: Not on file  ? Number of children: 3  ? Years of education: 23  ? Highest education level: Not on file  ?Occupational History  ? Not on file  ?Tobacco Use  ? Smoking status: Former  ?  Packs/day: 0.10  ?   Years: 1.00  ?  Pack years: 0.10  ?  Types: Cigarettes  ?  Quit date: 70  ?  Years since quitting: 41.3  ? Smokeless tobacco: Never  ? Tobacco comments:  ?  for a short period  ?Vaping Use  ? Vaping Use: Never used  ?Substance and Sexual Activity  ? Alcohol use: No  ? Drug use: No  ? Sexual activity: Not on file  ?Other Topics Concern  ? Not on file  ?Social History Narrative  ? Right handed  ? Caffeine use: coffee daily  ? ?Social Determinants of Health  ? ?Financial Resource Strain: Not on file  ?Food Insecurity: Not on file  ?Transportation Needs: Not on file  ?Physical Activity: Not on file  ?Stress: Not on file  ?Social Connections: Not on file  ? ? ?Hospital Course: Tien was voluntarily admitted to geriatric psychiatry under routine orders and precautions.  She was referred by Dr. Casimiro Needle, psychiatry.  She had a neurology work-up for auditory and visual hallucinations prior to admission and was referred to psychiatry who thought that she could benefit from inpatient.   ? ?While on the inpatient unit her medications were changed.  Her Abilify was discontinued and Haldol 0.5 mg twice a day was added.  Her Lexapro was originally discontinued and she was started on Remeron 15 mg at bedtime for sleep and appetite and depression but she refused it for some reason, so I put her back on something she was familiar with, Lexapro but at a lower dose 10 mg/day.  Her Norvasc was increased to 10 mg for hypertension.  She continued on Zestril.  Ativan was continued as needed but she never required it.  We continued her Wellbutrin XL 150 mg/day.  And at the request of neurology we decreased her prednisone to 2.5 mg/day.  If she was on Atarax as needed it was discontinued.  She did not require anything for sleep and she slept well on the unit and her appetite slightly improved.  She required Ensure in between meals.  Her behavior improved and she denied auditory or visual hallucinations after the second day.  She  tolerated the medications without any side effects.  There was no evidence of EPS or TD.  She continued to have delusions of odd nature but for the most part they also decreased.  It was felt that she maximized hospitalization she was discharged home.  Physical therapy was consulted and they recommended home health.  On the day of discharge she denied auditory or visual hallucinations.  She denied depression or suicidal ideation.  Her judgment and insight remain poor. ? ?Physical Findings: ?AIMS:  , ,  ,  ,    ?CIWA:    ?COWS:    ? ?Musculoskeletal: ?Strength & Muscle Tone: within  normal limits ?Gait & Station: unable to stand ?Patient leans: N/A ? ? ?Psychiatric Specialty Exam: ? ?Presentation  ?General Appearance: Appropriate for Environment ? ?Eye Contact:Good ? ?Speech:Clear and Coherent ? ?Speech Volume:Normal ? ?Handedness:No data recorded ? ?Mood and Affect  ?Mood:Euthymic ? ?Affect:Congruent ? ? ?Thought Process  ?Thought Processes:No data recorded ?Descriptions of Associations:No data recorded ?Orientation:Full (Time, Place and Person) ? ?Thought Content:-- (My husband says I am confused) ? ?History of Schizophrenia/Schizoaffective disorder:No data recorded ?Duration of Psychotic Symptoms:No data recorded ?Hallucinations:No data recorded ?Ideas of Reference:Delusions; Paranoia ? ?Suicidal Thoughts:No data recorded ?Homicidal Thoughts:No data recorded ? ?Sensorium  ?Memory:Immediate Fair ? ?Judgment:Fair ? ?Insight:Fair ? ? ?Executive Functions  ?Concentration:Fair ? ?Attention Span:Fair ? ?Recall:Fair ? ?Max ? ?Language:Fair ? ? ?Psychomotor Activity  ?Psychomotor Activity:No data recorded ? ?Assets  ?Assets:Communication Skills; Desire for Improvement; Financial Resources/Insurance; Resilience ? ? ?Sleep  ?Sleep:No data recorded ? ? ?Physical Exam: ?Physical Exam ?Vitals and nursing note reviewed.  ?Constitutional:   ?   Appearance: Normal appearance. She is normal weight.   ?Neurological:  ?   General: No focal deficit present.  ?   Mental Status: She is alert and oriented to person, place, and time.  ?Psychiatric:     ?   Attention and Perception: Attention and perception normal.     ?   Mood a

## 2022-01-30 NOTE — Progress Notes (Signed)
Patient is sitting in the day room calm and cooperative at this time. Remain on 1:1 observation with sitter by her side. Appetite remain poor consume less than 25% of breakfast, but consume 100% of ensure supplement. ?

## 2022-01-30 NOTE — Progress Notes (Signed)
Discharge instructions reviewed with patient  and patient husband including follow up appointment with provider, medication and prescriptions.  Questions answered and understanding verbalized.  Discharge packet given.  All belongings returned to patient after verification completed by staff.   ? ?Patient escorted by staff off unit in stable condition without complaint. ?

## 2022-01-30 NOTE — Plan of Care (Signed)
?  Problem: Group Participation ?Goal: STG - Patient will engage in groups without prompting or encouragement from LRT x3 group sessions within 5 recreation therapy group sessions ?Description: STG - Patient will engage in groups without prompting or encouragement from LRT x3 group sessions within 5 recreation therapy group sessions ?Outcome: Completed/Met ?  ?

## 2022-01-30 NOTE — [facility] Suicide Risk Assessment (Signed)
[facility] Discharge Suicide Risk Assessment ? ? ?Principal Problem: Hallucinations ?Discharge Diagnoses: Principal Problem: ?  Hallucinations ? ? ?Total Time spent with patient: 1 hour ? ?Musculoskeletal: ?Strength & Muscle Tone: within normal limits ?Gait & Station: unable to stand ?Patient leans: N/A ? ?Psychiatric Specialty Exam ? ?Presentation  ?General Appearance: Appropriate for Environment ? ?Eye Contact:Good ? ?Speech:Clear and Coherent ? ?Speech Volume:Normal ? ?Handedness:No data recorded ? ?Mood and Affect  ?Mood:Euthymic ? ?Duration of Depression Symptoms: No data recorded ?Affect:Congruent ? ? ?Thought Process  ?Thought Processes:No data recorded ?Descriptions of Associations:No data recorded ?Orientation:Full (Time, Place and Person) ? ?Thought Content:-- (My husband says I am confused) ? ?History of Schizophrenia/Schizoaffective disorder:No data recorded ?Duration of Psychotic Symptoms:No data recorded ?Hallucinations:No data recorded ?Ideas of Reference:Delusions; Paranoia ? ?Suicidal Thoughts:No data recorded ?Homicidal Thoughts:No data recorded ? ?Sensorium  ?Memory:Immediate Fair ? ?Judgment:Fair ? ?Insight:Fair ? ? ?Executive Functions  ?Concentration:Fair ? ?Attention Span:Fair ? ?Recall:Fair ? ?[place] ? ?Language:Fair ? ? ?Psychomotor Activity  ?Psychomotor Activity:No data recorded ? ?Assets  ?Assets:Communication Skills; Desire for Improvement; Financial Resources/Insurance; Resilience ? ? ?Sleep  ?Sleep:No data recorded ? ? ?Blood pressure (!) 137/50, pulse (!) 105, temperature 98.5 ?F (36.9 ?C), temperature source Oral, resp. rate (!) 22, height 5' (1.524 m), weight 43.1 kg, SpO2 99 %. Body mass index is 18.55 kg/m?. ? ?Mental Status Per Nursing Assessment::   ?On Admission:  NA ? ?Demographic Factors:  ?Age 16 or older and Caucasian ? ?Loss Factors: ?NA ? ?Historical Factors: ?NA ? ?Risk Reduction Factors:   ?Living with another person, especially a relative and Positive social  support ? ?Continued Clinical Symptoms:  ?Medical Diagnoses and Treatments/Surgeries ? ?Cognitive Features That Contribute To Risk:  ?None   ? ?Suicide Risk:  ?Minimal: No identifiable suicidal ideation.  Patients presenting with no risk factors but with morbid ruminations; may be classified as minimal risk based on the severity of the depressive symptoms ? ? ? ?Plan Of Care/Follow-up recommendations: Neurology and Psychiatry ? ? ?Parks Ranger, DO ?01/30/2022, 10:15 AM ?

## 2022-01-30 NOTE — Progress Notes (Signed)
Pt sitting in day room; calm, cooperative. Pt states "I feel good." She denies pain and SI/HI/AVH at this time. She describes her sleep as "pretty good" and states that her appetite is "coming back" and when she gets home and her husband starts cooking for her, it will be good. She currently denies depression, but expresses feelings of anxiety due to "I'm worried that my granddaughter won't be able to get out of here." No acute distress noted. ?

## 2022-01-30 NOTE — Progress Notes (Signed)
?  [facility] Adult Case Management Discharge Plan : ? ?Will you be returning to the same living situation after discharge:  Yes,  pt will be returning back to her home ?At discharge, do you have transportation home?: Yes,  pt's husband is providing transportation at discharge ?Do you have the ability to pay for your medications: Yes,  pt has [facility] Medicare ? ?Release of information consent forms completed and in the chart;  Patient's signature needed at discharge. ? ?Patient to Follow up at: ? Follow-up Information   ? ? [facility] Follow up on 02/26/2022.   ?Specialty: Behavioral Health ?Why: You have an appointment for followup scheduled with Dr. Casimiro Needle Tuesday May 23rd at 5:10pm (in person). Thanks! ?Contact information: ?[address] ?[place] [address] ?[phone] ? ? ?  ?  ? ?  ?  ? ?  ? ? ?Next level of care provider has access to [place] ? ?Safety Planning and Suicide Prevention discussed: Yes,  SPE completed with pt's daughter ? ?  ? ?Has patient been referred to the Quitline?: Patient refused referral ? ?Patient has been referred for addiction treatment: N/A ? ?Teri Diltz A [place], [place] ?01/30/2022, 10:17 AM ?

## 2022-01-30 NOTE — Progress Notes (Signed)
Patient remain alert oriented to self. Denies SI, HI, AVH. Denies anxiety, depression and pain. Currently sitting in the day room among peers. Remains on 1:1 observation and fall precaution without incident. Assigned sitter in attendance. Compliant with due medication. Remain safe on the unit at this time.  ?

## 2022-01-30 NOTE — Progress Notes (Signed)
Patient is currently in day room for lunch. Appetite remains poor consume 25% of lunch. Drink 100% of ensure supplement. 1:1 observation continue with sitter within reach. Remain safe on the unit with Q15 minute safety checks. ?

## 2022-01-31 NOTE — Telephone Encounter (Signed)
Mckenzie [name] had a recent hospitalization but we are needing to taper her off the prednisone. I recommend planning to follow up in 2 months to recheck her off the steroids please contact about making an appointment.

## 2022-03-05 ENCOUNTER — Other Ambulatory Visit: Payer: Self-pay | Admitting: Family Medicine

## 2022-03-05 DIAGNOSIS — E1165 Type 2 diabetes mellitus with hyperglycemia: Secondary | ICD-10-CM

## 2022-03-26 ENCOUNTER — Ambulatory Visit: Payer: Medicare Other | Admitting: Physician Assistant

## 2022-03-26 ENCOUNTER — Ambulatory Visit: Payer: Medicare Other | Admitting: Internal Medicine

## 2022-03-27 ENCOUNTER — Encounter (INDEPENDENT_AMBULATORY_CARE_PROVIDER_SITE_OTHER): Payer: Medicare Other | Admitting: Ophthalmology

## 2022-05-29 ENCOUNTER — Ambulatory Visit (INDEPENDENT_AMBULATORY_CARE_PROVIDER_SITE_OTHER): Payer: Medicare Other | Admitting: Family Medicine

## 2022-05-29 ENCOUNTER — Encounter: Payer: Self-pay | Admitting: Family Medicine

## 2022-05-29 VITALS — BP 172/69 | HR 78 | Temp 98.8°F | Ht 60.0 in | Wt 120.0 lb

## 2022-05-29 DIAGNOSIS — I152 Hypertension secondary to endocrine disorders: Secondary | ICD-10-CM

## 2022-05-29 DIAGNOSIS — N3000 Acute cystitis without hematuria: Secondary | ICD-10-CM

## 2022-05-29 DIAGNOSIS — E1169 Type 2 diabetes mellitus with other specified complication: Secondary | ICD-10-CM

## 2022-05-29 DIAGNOSIS — E559 Vitamin D deficiency, unspecified: Secondary | ICD-10-CM | POA: Diagnosis not present

## 2022-05-29 DIAGNOSIS — E785 Hyperlipidemia, unspecified: Secondary | ICD-10-CM

## 2022-05-29 DIAGNOSIS — R8290 Unspecified abnormal findings in urine: Secondary | ICD-10-CM

## 2022-05-29 DIAGNOSIS — E1159 Type 2 diabetes mellitus with other circulatory complications: Secondary | ICD-10-CM | POA: Diagnosis not present

## 2022-05-29 DIAGNOSIS — E1165 Type 2 diabetes mellitus with hyperglycemia: Secondary | ICD-10-CM | POA: Diagnosis not present

## 2022-05-29 DIAGNOSIS — Z23 Encounter for immunization: Secondary | ICD-10-CM

## 2022-05-29 LAB — URINALYSIS
Bilirubin, UA: NEGATIVE
Glucose, UA: NEGATIVE
Ketones, UA: NEGATIVE
Nitrite, UA: NEGATIVE
Specific Gravity, UA: 1.015 (ref 1.005–1.030)
Urobilinogen, Ur: 0.2 mg/dL (ref 0.2–1.0)
pH, UA: 7.5 (ref 5.0–7.5)

## 2022-05-29 LAB — BAYER DCA HB A1C WAIVED: HB A1C (BAYER DCA - WAIVED): 5.7 % — ABNORMAL HIGH (ref 4.8–5.6)

## 2022-05-29 MED ORDER — LISINOPRIL 10 MG PO TABS: 10.0000 mg | ORAL_TABLET | Freq: Every day | ORAL | 2 refills | Status: DC

## 2022-05-29 MED ORDER — AMLODIPINE BESYLATE 10 MG PO TABS: 10.0000 mg | ORAL_TABLET | Freq: Every day | ORAL | 2 refills | Status: DC

## 2022-05-29 MED ORDER — METFORMIN HCL ER 500 MG PO TB24: 500.0000 mg | ORAL_TABLET | Freq: Every day | ORAL | 2 refills | Status: DC

## 2022-05-29 NOTE — Progress Notes (Signed)
Subjective:  [name], female    DOB: Sep 09, 1946, 76 y.o.   MRN: [id number]  Patient Care Team: Loman Brooklyn, FNP as PCP - General (Family Medicine) Eloise Harman, DO as Consulting Physician (Internal Medicine) Wyatt Portela, MD as Consulting Physician (Oncology)   Chief Complaint:  Medical Management of Chronic Issues   HPI: Mckenzie [name] is a 76 y.o. female presenting on 05/29/2022 for Medical Management of Chronic Issues   Pt presents today to establish care with new PCP as her PCP is leaving the practice. She is here for management of chronic medical conditions. She is followed by neurology, opthalmology, and psychiatry on a regular basis.    1. Type 2 diabetes mellitus with hyperglycemia, without long-term current use of insulin (HCC) On monotherapy with metformin. Does not check blood sugars at home. Last A1C over 7. Denies polyphagia, polydipsia, or polyuria. Does report cloudy urine, denies dysuria or frequency.   2. Hypertension associated with diabetes ([place]) On lisinopril and Amlodipine. Reports good control at home. BP is elevated in office today. Does not exercise due to mobility issues. No chest pain, headaches, dizziness, or syncope. She does have lower extremity swelling.   3. Hyperlipidemia associated with type 2 diabetes mellitus ([place]) Not on statin therapy. Appetite is poor and she only eats "junk food" per husband.   4. Vitamin D deficiency On daily repletion therapy. Denies recent fractures. Does have ongoing arthralgias but nothing new.     Relevant past medical, surgical, family, and social history reviewed and updated as indicated.  Allergies and medications reviewed and updated. Data reviewed: Chart in Epic.   Past Medical History:  Diagnosis Date   Anemia    low iron   Arthritis    Depression    Diabetes ([place]) 11/30/2021   High cholesterol    Hypertension    Retinal micro-aneurysm of right eye    Stroke ([place])     2 or 3 mini strokes    Past Surgical History:  Procedure Laterality Date   ARTERY BIOPSY Bilateral 12/15/2020   Procedure: BILATERAL TEMPORAL ARTERY BIOPSIES;  Surgeon: Angelia Mould, MD;  Location: [facility] OR;  Service: Vascular;  Laterality: Bilateral;   COLONOSCOPY WITH PROPOFOL N/A 12/09/2020   Procedure: COLONOSCOPY WITH PROPOFOL;  Surgeon: Gatha Mayer, MD;  Location[facility] ENDOSCOPY;  Service: Endoscopy;  Laterality: N/A;   ESOPHAGOGASTRODUODENOSCOPY (EGD) WITH PROPOFOL N/A 12/09/2020   Procedure: ESOPHAGOGASTRODUODENOSCOPY (EGD) WITH PROPOFOL;  Surgeon: Gatha Mayer, MD;  Location: [place];  Service: Endoscopy;  Laterality: N/A;   EYE SURGERY     FINGER SURGERY Left    RING FINGER   IR THORACENTESIS ASP PLEURAL SPACE W/IMG GUIDE  12/13/2020   OPEN REDUCTION INTERNAL FIXATION (ORIF) DISTAL RADIAL FRACTURE Left 10/05/2020   Procedure: OPEN REDUCTION INTERNAL FIXATION (ORIF) DISTAL RADIAL FRACTURE;  Surgeon: Shona Needles, MD;  Location: [place];  Service: Orthopedics;  Laterality: Left;  supraclavicular block   POLYPECTOMY  12/09/2020   Procedure: POLYPECTOMY;  Surgeon: Gatha Mayer, MD;  Location: [facility] ENDOSCOPY;  Service: Endoscopy;;   SPINAL FUSION  08/10/2021   T10-L2   TUBAL LIGATION      Social History   Socioeconomic History   Marital status: Married    Spouse name: Not on file   Number of children: 3   Years of education: 12   Highest education level: Not on file  Occupational History   Not on file  Tobacco Use   Smoking status: Former    Packs/day: 0.10    Years: 1.00    Total pack years: 0.10    Types: Cigarettes    Quit date: 74    Years since quitting: 41.6   Smokeless tobacco: Never   Tobacco comments:    for a short period  Vaping Use   Vaping Use: Never used  Substance and Sexual Activity   Alcohol use: No   Drug use: No   Sexual activity: Not on file  Other Topics Concern   Not on file  Social History Narrative   Right  handed   Caffeine use: coffee daily   Social Determinants of Health   Financial Resource Strain: Not on file  Food Insecurity: Not on file  Transportation Needs: Not on file  Physical Activity: Not on file  Stress: Not on file  Social Connections: Not on file  Intimate Partner Violence: Not on file    Outpatient Encounter Medications as of 05/29/2022  Medication Sig   ARIPiprazole (ABILIFY) 5 MG tablet Take by mouth.   Bromfenac Sodium 0.07 % SOLN Place 1 drop into the right eye every evening.   buPROPion (WELLBUTRIN XL) 150 MG 24 hr tablet Take 1 tablet (150 mg total) by mouth daily.   Calcium Carb-Cholecalciferol (CALCIUM+D3 PO) Take 2 tablets by mouth in the morning.   COMBIGAN 0.2-0.5 % ophthalmic solution Place 1 drop into both eyes every 12 (twelve) hours.   dorzolamide (TRUSOPT) 2 % ophthalmic solution Place 1 drop into the left eye 2 (two) times daily.   Ergocalciferol (VITAMIN D2) 50 MCG (2000 UT) TABS Take 1 tablet by mouth daily.   escitalopram (LEXAPRO) 10 MG tablet Take 1 tablet (10 mg total) by mouth daily after breakfast.   fluorometholone (FML) 0.1 % ophthalmic suspension Place 1 drop into the right eye 2 (two) times daily.   haloperidol (HALDOL) 0.5 MG tablet Take 1 tablet (0.5 mg total) by mouth 2 (two) times daily at 8 am and 4 pm.   latanoprost (XALATAN) 0.005 % ophthalmic solution Place 1 drop into both eyes at bedtime.   LORazepam (ATIVAN) 0.5 MG tablet Take 1 tablet (0.5 mg total) by mouth 2 (two) times daily as needed for anxiety.   magnesium oxide (MAG-OX) 400 MG tablet Take 400 mg by mouth in the morning.   Multiple Vitamin (MULTIVITAMIN WITH MINERALS) TABS tablet Take 1 tablet by mouth in the morning. One A Day Multivitamin   risperiDONE (RISPERDAL) 0.25 MG tablet Take 0.25 mg by mouth 2 (two) times daily.   [DISCONTINUED] amLODipine (NORVASC) 10 MG tablet Take 1 tablet (10 mg total) by mouth daily.   [DISCONTINUED] lisinopril (ZESTRIL) 10 MG tablet Take 1  tablet (10 mg total) by mouth daily.   [DISCONTINUED] metFORMIN (GLUCOPHAGE-XR) 500 MG 24 hr tablet Take 1 tablet by mouth once daily with breakfast   amLODipine (NORVASC) 10 MG tablet Take 1 tablet (10 mg total) by mouth daily.   lisinopril (ZESTRIL) 10 MG tablet Take 1 tablet (10 mg total) by mouth daily.   metFORMIN (GLUCOPHAGE-XR) 500 MG 24 hr tablet Take 1 tablet (500 mg total) by mouth daily with breakfast.   [DISCONTINUED] predniSONE (DELTASONE) 2.5 MG tablet Take 1 tablet (2.5 mg total) by mouth daily with breakfast.   No facility-administered encounter medications on file as of 05/29/2022.    Allergies  Allergen Reactions   Aldomet [Methyldopa] Other (See Comments)    Flu-like symptoms   Fosamax [Alendronate Sodium]  Other (See Comments)    Weakness/fatigue    Plavix [Clopidogrel] Other (See Comments)    Arm swelled, bleeding   Acetazolamide Rash   Buspar [Buspirone] Palpitations    Review of Systems  Constitutional:  Positive for activity change and appetite change. Negative for chills, diaphoresis, fatigue, fever and unexpected weight change.  Eyes:  Negative for photophobia and visual disturbance.  Respiratory:  Negative for cough and shortness of breath.   Cardiovascular:  Positive for leg swelling. Negative for chest pain and palpitations.  Gastrointestinal:  Negative for abdominal pain, constipation, diarrhea, nausea and vomiting.  Endocrine: Negative for polydipsia, polyphagia and polyuria.  Genitourinary:  Negative for decreased urine volume and difficulty urinating.  Musculoskeletal:  Positive for arthralgias and gait problem.  Neurological:  Positive for weakness (generalized). Negative for dizziness, syncope and headaches.  Psychiatric/Behavioral:  Positive for agitation, confusion and sleep disturbance. Negative for behavioral problems, decreased concentration, dysphoric mood, hallucinations, self-injury and suicidal ideas. The patient is nervous/anxious. The  patient is not hyperactive.   All other systems reviewed and are negative.       Objective:  BP (!) 172/69   Pulse 78   Temp 98.8 F (37.1 C)   Ht 5' (1.524 m)   Wt 120 lb (54.4 kg)   SpO2 95%   BMI 23.44 kg/m    Wt Readings from Last 3 Encounters:  05/29/22 120 lb (54.4 kg)  01/22/22 118 lb (53.5 kg)  12/12/21 116 lb 8 oz (52.8 kg)    Physical Exam Vitals and nursing note reviewed.  Constitutional:      Appearance: She is normal weight.     Comments: Chronically ill  HENT:     Head: Normocephalic and atraumatic.     Mouth/Throat:     Mouth: Mucous membranes are moist.  Eyes:     Pupils: Pupils are equal, round, and reactive to light.  Cardiovascular:     Rate and Rhythm: Normal rate and regular rhythm.     Heart sounds: Normal heart sounds.  Pulmonary:     Effort: Pulmonary effort is normal.     Breath sounds: Normal breath sounds.  Musculoskeletal:     Right lower leg: Edema present.     Left lower leg: Edema present.  Skin:    General: Skin is warm and dry.     Capillary Refill: Capillary refill takes less than 2 seconds.  Neurological:     Mental Status: She is alert. Mental status is at baseline.     Gait: Gait abnormal (in wheelchair with gait belt around waist).  Psychiatric:        Cognition and Memory: Cognition is impaired. Memory is impaired.     Results for orders placed or performed during the hospital encounter of 01/22/22  Resp Panel by RT-PCR (Flu A&B, Covid) Nasopharyngeal Swab   Specimen: Nasopharyngeal Swab; Nasopharyngeal(NP) swabs in vial transport medium  Result Value Ref Range   SARS Coronavirus 2 by RT PCR NEGATIVE NEGATIVE   Influenza A by PCR NEGATIVE NEGATIVE   Influenza B by PCR NEGATIVE NEGATIVE  Comprehensive metabolic panel  Result Value Ref Range   Sodium 140 135 - 145 mmol/L   Potassium 3.3 (L) 3.5 - 5.1 mmol/L   Chloride 104 98 - 111 mmol/L   CO2 27 22 - 32 mmol/L   Glucose, Bld 141 (H) 70 - 99 mg/dL   BUN 16 8 - 23  mg/dL   Creatinine, Ser 0.72 0.44 - 1.00 mg/dL  Calcium 9.7 8.9 - 10.3 mg/dL   Total Protein 8.3 (H) 6.5 - 8.1 g/dL   Albumin 3.7 3.5 - 5.0 g/dL   AST 20 15 - 41 U/L   ALT 19 0 - 44 U/L   Alkaline Phosphatase 113 38 - 126 U/L   Total Bilirubin 0.5 0.3 - 1.2 mg/dL   GFR, Estimated >60 >60 mL/min   Anion gap 9 5 - 15  Ethanol  Result Value Ref Range   Alcohol, Ethyl (B) <10 <10 mg/dL  Salicylate level  Result Value Ref Range   Salicylate Lvl <7.0 (L) 7.0 - 30.0 mg/dL  Acetaminophen level  Result Value Ref Range   Acetaminophen (Tylenol), Serum <10 (L) 10 - 30 ug/mL  cbc  Result Value Ref Range   WBC 8.5 4.0 - 10.5 K/uL   RBC 4.78 3.87 - 5.11 MIL/uL   Hemoglobin 12.5 12.0 - 15.0 g/dL   HCT 40.5 36.0 - 46.0 %   MCV 84.7 80.0 - 100.0 fL   MCH 26.2 26.0 - 34.0 pg   MCHC 30.9 30.0 - 36.0 g/dL   RDW 16.8 (H) 11.5 - 15.5 %   Platelets 470 (H) 150 - 400 K/uL   nRBC 0.0 0.0 - 0.2 %  Urine Drug Screen, Qualitative  Result Value Ref Range   Tricyclic, Ur Screen NONE DETECTED NONE DETECTED   Amphetamines, Ur Screen NONE DETECTED NONE DETECTED   MDMA (Ecstasy)Ur Screen NONE DETECTED NONE DETECTED   Cocaine Metabolite,Ur [place] NONE DETECTED NONE DETECTED   Opiate, Ur Screen NONE DETECTED NONE DETECTED   Phencyclidine (PCP) Ur S NONE DETECTED NONE DETECTED   Cannabinoid 50 Ng, Ur [place] NONE DETECTED NONE DETECTED   Barbiturates, Ur Screen NONE DETECTED NONE DETECTED   Benzodiazepine, Ur Scrn POSITIVE (A) NONE DETECTED   Methadone Scn, Ur NONE DETECTED NONE DETECTED       Pertinent labs & imaging results that were available during my care of the patient were reviewed by me and considered in my medical decision making.  Assessment & Plan:  Alyric was seen today for medical management of chronic issues.  Diagnoses and all orders for this visit:  Need for Td vaccine Updated today -     Td : Tetanus/diphtheria >7yo Preservative  free  Type 2 diabetes mellitus with hyperglycemia,  without long-term current use of insulin (HCC) Labs pending, diet and exercise encouraged. Eye exam records have been requested.  -     metFORMIN (GLUCOPHAGE-XR) 500 MG 24 hr tablet; Take 1 tablet (500 mg total) by mouth daily with breakfast. -     CMP14+EGFR -     CBC with Differential/Platelet -     Lipid panel -     Thyroid Panel With TSH -     Bayer DCA Hb A1c Waived -     Microalbumin / creatinine urine ratio  Hypertension associated with diabetes (HCC) Reports good control at home. Pt aware to bring machine to next visit. Does have lower extremity swelling. If BP remains uncontrolled or well controlled, will adjust regimen by lowering Norvasc and increasing lisinopril.  -     lisinopril (ZESTRIL) 10 MG tablet; Take 1 tablet (10 mg total) by mouth daily. -     amLODipine (NORVASC) 10 MG tablet; Take 1 tablet (10 mg total) by mouth daily. -     CMP14+EGFR -     CBC with Differential/Platelet -     Thyroid Panel With TSH -     Microalbumin /   creatinine urine ratio  Hyperlipidemia associated with type 2 diabetes mellitus ([place]) Labs pending. Not on statin therapy, will discuss initiation at next visit.  -     CMP14+EGFR -     Lipid panel  Vitamin D deficiency Labs pending. Continue repletion therapy. If indicated, will change repletion dosage. Eat foods rich in Vit D including milk, orange juice, yogurt with vitamin D added, salmon or mackerel, canned tuna fish, cereals with vitamin D added, and cod liver oil. Get out in the sun but make sure to wear at least SPF 30 sunscreen.  -     VITAMIN D 25 Hydroxy (Vit-D Deficiency, Fractures)  Cloudy urine Leukocytes on dip, culture pending, will treat if warranted.  -     Urinalysis -     Urine Culture     Continue all other maintenance medications.  Follow up plan: Return in about 3 months (around 08/29/2022), or if symptoms worsen or fail to improve, for DM.   Continue healthy lifestyle choices, including diet (rich in fruits,  vegetables, and lean proteins, and low in salt and simple carbohydrates) and exercise (at least 30 minutes of moderate physical activity daily).  Educational handout given for DM  The above assessment and management plan was discussed with the patient. The patient verbalized understanding of and has agreed to the management plan. Patient is aware to call the clinic if they develop any new symptoms or if symptoms persist or worsen. Patient is aware when to return to the clinic for a follow-up visit. Patient educated on when it is appropriate to go to the emergency department.   Monia Pouch, FNP-C [facility] [phone]

## 2022-05-30 LAB — CBC WITH DIFFERENTIAL/PLATELET
Basophils Absolute: 0 10*3/uL (ref 0.0–0.2)
Basos: 0 %
EOS (ABSOLUTE): 0.3 10*3/uL (ref 0.0–0.4)
Eos: 3 %
Hematocrit: 37.4 % (ref 34.0–46.6)
Hemoglobin: 11.8 g/dL (ref 11.1–15.9)
Immature Grans (Abs): 0 10*3/uL (ref 0.0–0.1)
Immature Granulocytes: 0 %
Lymphocytes Absolute: 1.9 10*3/uL (ref 0.7–3.1)
Lymphs: 23 %
MCH: 27.1 pg (ref 26.6–33.0)
MCHC: 31.6 g/dL (ref 31.5–35.7)
MCV: 86 fL (ref 79–97)
Monocytes Absolute: 0.5 10*3/uL (ref 0.1–0.9)
Monocytes: 5 %
Neutrophils Absolute: 5.8 10*3/uL (ref 1.4–7.0)
Neutrophils: 69 %
Platelets: 367 10*3/uL (ref 150–450)
RBC: 4.36 x10E6/uL (ref 3.77–5.28)
RDW: 13.2 % (ref 11.7–15.4)
WBC: 8.4 10*3/uL (ref 3.4–10.8)

## 2022-05-30 LAB — THYROID PANEL WITH TSH
Free Thyroxine Index: 2.3 (ref 1.2–4.9)
T3 Uptake Ratio: 25 % (ref 24–39)
T4, Total: 9.1 ug/dL (ref 4.5–12.0)
TSH: 1.41 u[IU]/mL (ref 0.450–4.500)

## 2022-05-30 LAB — MICROALBUMIN / CREATININE URINE RATIO
Creatinine, Urine: 54.6 mg/dL
Microalb/Creat Ratio: 65 mg/g creat — ABNORMAL HIGH (ref 0–29)
Microalbumin, Urine: 35.3 ug/mL

## 2022-05-30 LAB — CMP14+EGFR
ALT: 9 IU/L (ref 0–32)
AST: 20 IU/L (ref 0–40)
Albumin/Globulin Ratio: 1 — ABNORMAL LOW (ref 1.2–2.2)
Albumin: 3.8 g/dL (ref 3.8–4.8)
Alkaline Phosphatase: 116 IU/L (ref 44–121)
BUN/Creatinine Ratio: 17 (ref 12–28)
BUN: 12 mg/dL (ref 8–27)
Bilirubin Total: 0.2 mg/dL (ref 0.0–1.2)
CO2: 24 mmol/L (ref 20–29)
Calcium: 10.3 mg/dL (ref 8.7–10.3)
Chloride: 101 mmol/L (ref 96–106)
Creatinine, Ser: 0.7 mg/dL (ref 0.57–1.00)
Globulin, Total: 3.8 g/dL (ref 1.5–4.5)
Glucose: 98 mg/dL (ref 70–99)
Potassium: 4.5 mmol/L (ref 3.5–5.2)
Sodium: 140 mmol/L (ref 134–144)
Total Protein: 7.6 g/dL (ref 6.0–8.5)
eGFR: 89 mL/min/{1.73_m2} (ref >59)

## 2022-05-30 LAB — LIPID PANEL
Chol/HDL Ratio: 5.1 ratio — ABNORMAL HIGH (ref 0.0–4.4)
Cholesterol, Total: 262 mg/dL — ABNORMAL HIGH (ref 100–199)
HDL: 51 mg/dL (ref >39)
LDL Chol Calc (NIH): 176 mg/dL — ABNORMAL HIGH (ref 0–99)
Triglycerides: 191 mg/dL — ABNORMAL HIGH (ref 0–149)
VLDL Cholesterol Cal: 35 mg/dL (ref 5–40)

## 2022-05-30 LAB — VITAMIN D 25 HYDROXY (VIT D DEFICIENCY, FRACTURES): Vit D, 25-Hydroxy: 58.8 ng/mL (ref 30.0–100.0)

## 2022-06-03 ENCOUNTER — Encounter (INDEPENDENT_AMBULATORY_CARE_PROVIDER_SITE_OTHER): Payer: Medicare Other | Admitting: Ophthalmology

## 2022-06-03 LAB — URINE CULTURE

## 2022-06-03 MED ORDER — CIPROFLOXACIN HCL 500 MG PO TABS: 500.0000 mg | ORAL_TABLET | Freq: Two times a day (BID) | ORAL | 0 refills | Status: AC

## 2022-06-03 NOTE — Addendum Note (Signed)
Addended by: Baruch Gouty on: 06/03/2022 07:34 PM   Modules accepted: Orders

## 2022-06-06 ENCOUNTER — Encounter: Payer: Self-pay | Admitting: Family Medicine

## 2022-06-06 ENCOUNTER — Ambulatory Visit (INDEPENDENT_AMBULATORY_CARE_PROVIDER_SITE_OTHER): Payer: Medicare Other | Admitting: Family Medicine

## 2022-06-06 DIAGNOSIS — U071 COVID-19: Secondary | ICD-10-CM

## 2022-06-06 MED ORDER — ONDANSETRON HCL 4 MG PO TABS: 4.0000 mg | ORAL_TABLET | Freq: Three times a day (TID) | ORAL | 0 refills | Status: DC | PRN

## 2022-06-06 MED ORDER — BENZONATATE 100 MG PO CAPS: 100.0000 mg | ORAL_CAPSULE | Freq: Three times a day (TID) | ORAL | 0 refills | Status: DC | PRN

## 2022-06-06 MED ORDER — MOLNUPIRAVIR EUA 200MG CAPSULE: 4.0000 | ORAL_CAPSULE | Freq: Two times a day (BID) | ORAL | 0 refills | Status: AC

## 2022-06-06 NOTE — Progress Notes (Signed)
Virtual Visit  Note Due to COVID-19 pandemic this visit was conducted virtually. This visit type was conducted due to national recommendations for restrictions regarding the COVID-19 Pandemic (e.g. social distancing, sheltering in place) in an effort to limit this patient's exposure and mitigate transmission in our community. All issues noted in this document were discussed and addressed.  A physical exam was not performed with this format.  I connected with Mckenzie [name] on 06/06/22 at 1303 by telephone and verified that I am speaking with the correct person using two identifiers. Mckenzie [name] is currently located at home and her husband is currently with her during the visit. The provider, Gwenlyn Perking, FNP is located in their office at time of visit.  I discussed the limitations, risks, security and privacy concerns of performing an evaluation and management service by telephone and the availability of in person appointments. I also discussed with the patient that there may be a patient responsible charge related to this service. The patient expressed understanding and agreed to proceed.  CC: Covid 19  History and Present Illness:  URI  This is a new problem. The current episode started yesterday. The problem has been gradually worsening. The maximum temperature recorded prior to her arrival was 100.4 - 100.9 F. Associated symptoms include congestion, coughing, nausea, rhinorrhea, a sore throat and vomiting. Pertinent negatives include no abdominal pain, chest pain, diarrhea, dysuria, ear pain, headaches, joint pain, joint swelling, neck pain, plugged ear sensation, rash, sinus pain, sneezing or wheezing. She has tried decongestant and increased fluids for the symptoms. The treatment provided mild relief.      Review of Systems  HENT:  Positive for congestion, rhinorrhea and sore throat. Negative for ear pain, sinus pain and sneezing.   Respiratory:  Positive for cough.  Negative for wheezing.   Cardiovascular:  Negative for chest pain.  Gastrointestinal:  Positive for nausea and vomiting. Negative for abdominal pain and diarrhea.  Genitourinary:  Negative for dysuria.  Musculoskeletal:  Negative for joint pain and neck pain.  Skin:  Negative for rash.  Neurological:  Negative for headaches.     Observations/Objective: Alert and oriented x 3. Able to speak in full sentences without difficulty.   Assessment and Plan: Mckenzie [name] was seen today for covid positive.  Diagnoses and all orders for this visit:  COVID-19 Molnupiravir as below. Discussed symptomatic care and return precautions. Discussed quarantine and when to seek emergency care.  -     molnupiravir EUA (LAGEVRIO) 200 mg CAPS capsule; Take 4 capsules (800 mg total) by mouth 2 (two) times daily for 5 days. -     benzonatate (TESSALON PERLES) 100 MG capsule; Take 1 capsule (100 mg total) by mouth 3 (three) times daily as needed for cough. -     ondansetron (ZOFRAN) 4 MG tablet; Take 1 tablet (4 mg total) by mouth every 8 (eight) hours as needed for nausea or vomiting.     Follow Up Instructions: As needed.     I discussed the assessment and treatment plan with the patient. The patient was provided an opportunity to ask questions and all were answered. The patient agreed with the plan and demonstrated an understanding of the instructions.   The patient was advised to call back or seek an in-person evaluation if the symptoms worsen or if the condition fails to improve as anticipated.  The above assessment and management plan was discussed with the patient. The patient verbalized understanding of and has agreed to  the management plan. Patient is aware to call the clinic if symptoms persist or worsen. Patient is aware when to return to the clinic for a follow-up visit. Patient educated on when it is appropriate to go to the emergency department.   Time call ended:  1314  I provided 11 minutes of   non face-to-face time during this encounter.    Gwenlyn Perking, FNP

## 2022-06-19 ENCOUNTER — Other Ambulatory Visit: Payer: Self-pay

## 2022-06-19 ENCOUNTER — Other Ambulatory Visit: Payer: Medicare Other

## 2022-06-19 DIAGNOSIS — N3000 Acute cystitis without hematuria: Secondary | ICD-10-CM

## 2022-06-21 LAB — URINE CULTURE

## 2022-06-25 IMAGING — CT CT ABD-PELV W/ CM
2 of 5 series · 16 of 46 positions shown, 18 images · IV contrast (Omnipaque or Isovue)
Comparison: None.

CLINICAL DATA: Generalized abdominal pain. Constipation. Elevated
liver function tests.

EXAM:
CT ABDOMEN AND PELVIS WITH CONTRAST
TECHNIQUE: Multidetector CT imaging of the abdomen and pelvis was performed
using the standard protocol following bolus administration of
intravenous contrast.
CONTRAST:  75mL OMNIPAQUE IOHEXOL 300 MG/ML  SOLN

[Series 4: axial st · axial · 0.73mm/px · z∈[-359,+31]mm · 13 of 88 slices shown, 15 images]
[im 5/88  soft-tissue]
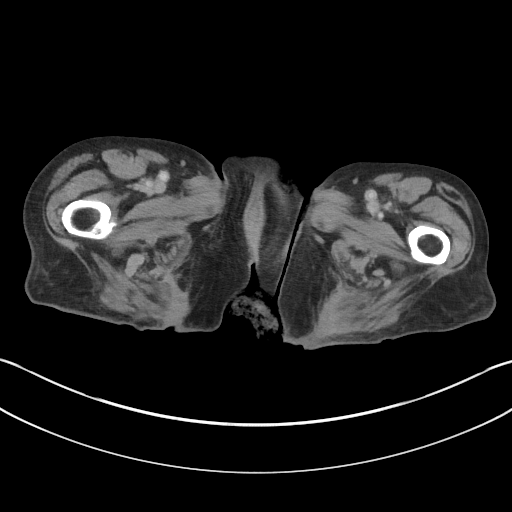
[im 5/88  bone]
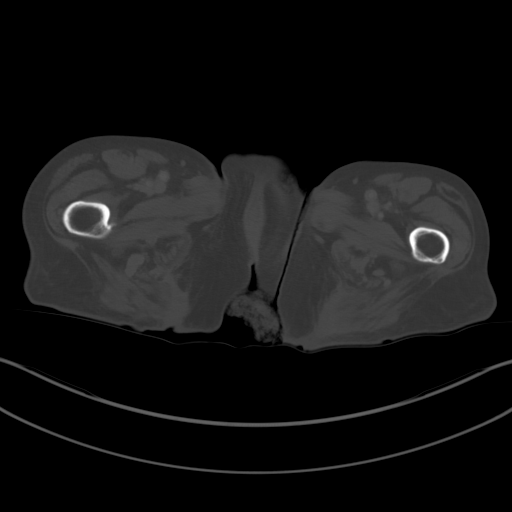
[im 14/88  soft-tissue]
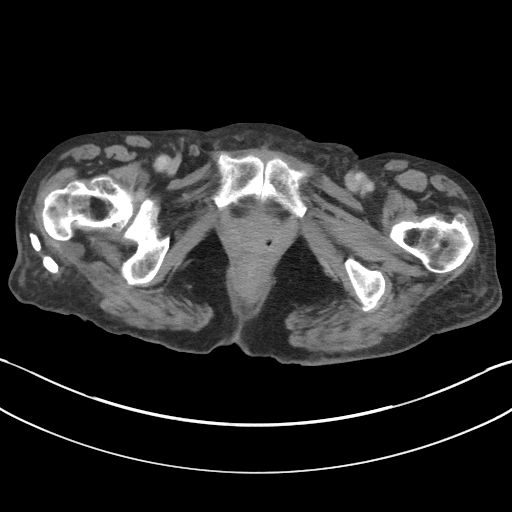
[im 19/88  soft-tissue]
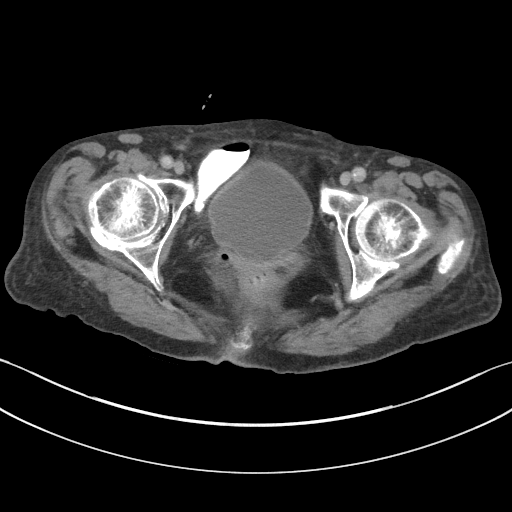
[im 23/88  soft-tissue]
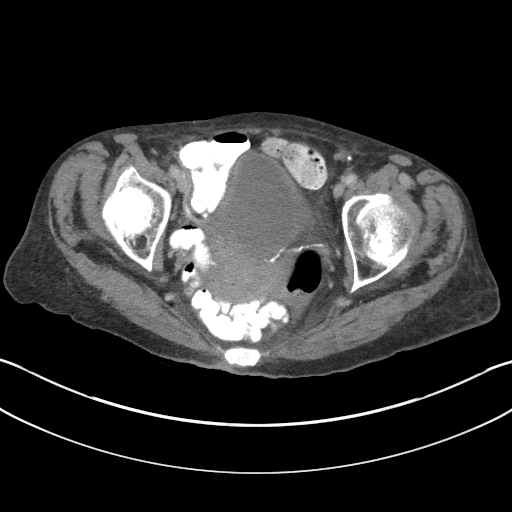
[im 33/88  soft-tissue]
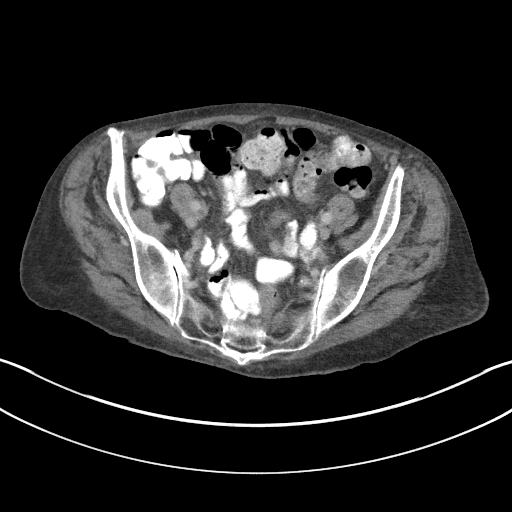
[im 37/88  soft-tissue]
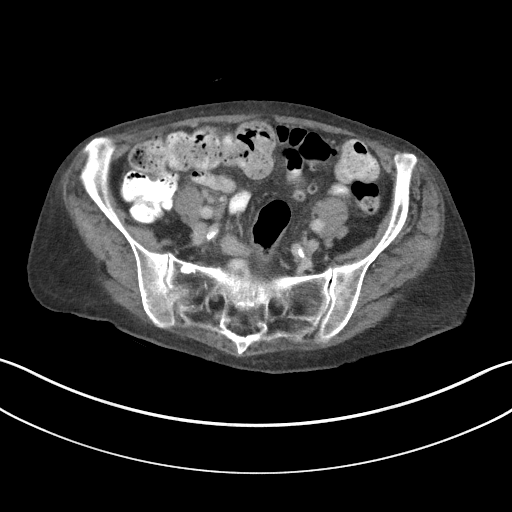
[im 46/88  soft-tissue]
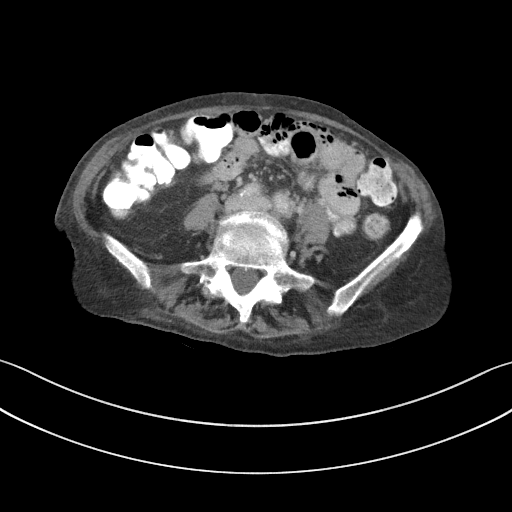
[im 51/88  soft-tissue]
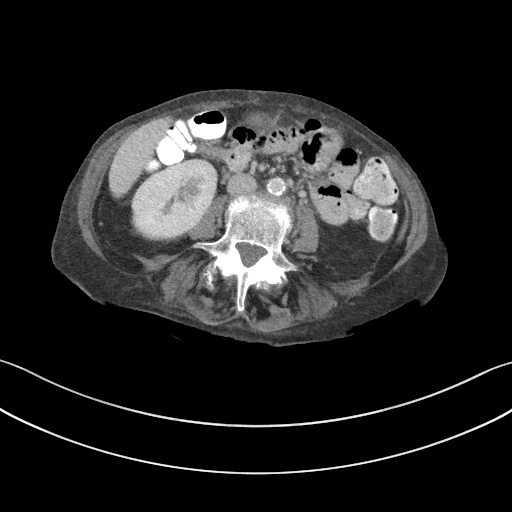
[im 55/88  soft-tissue]
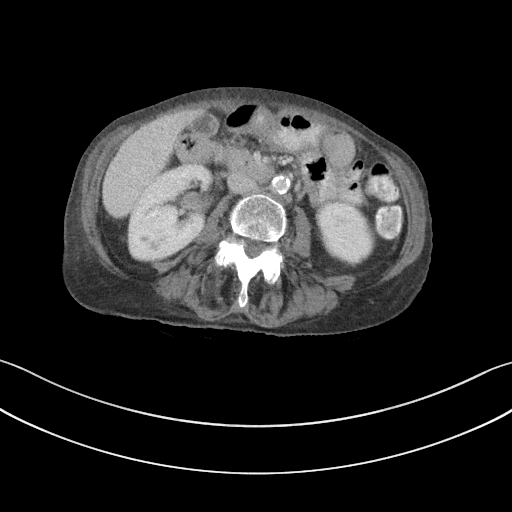
[im 55/88  bone]
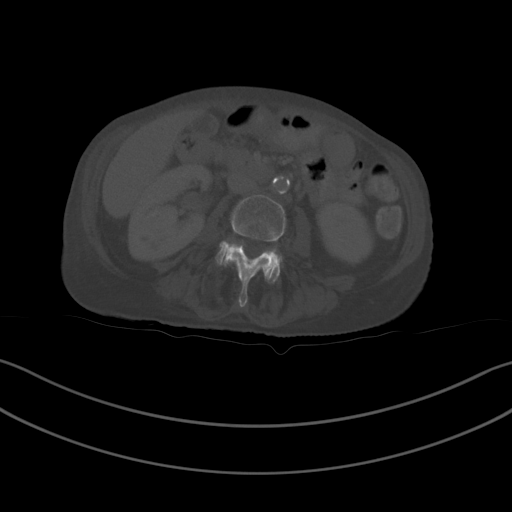
[im 65/88  soft-tissue]
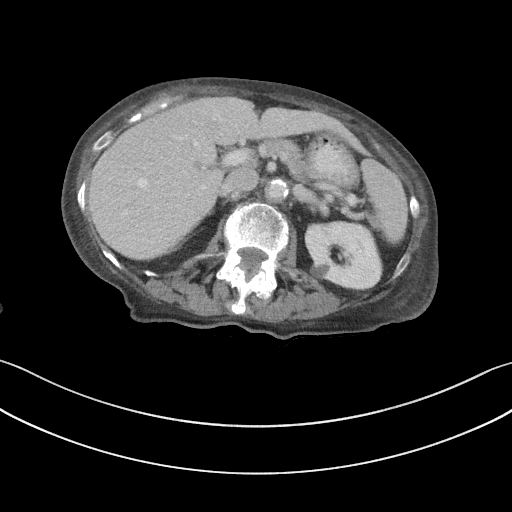
[im 69/88  soft-tissue]
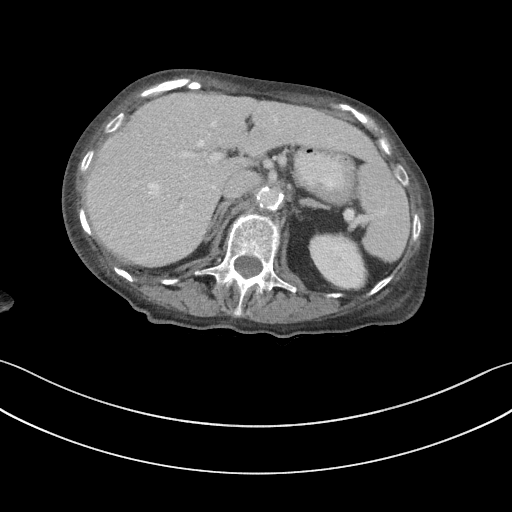
[im 74/88  soft-tissue]
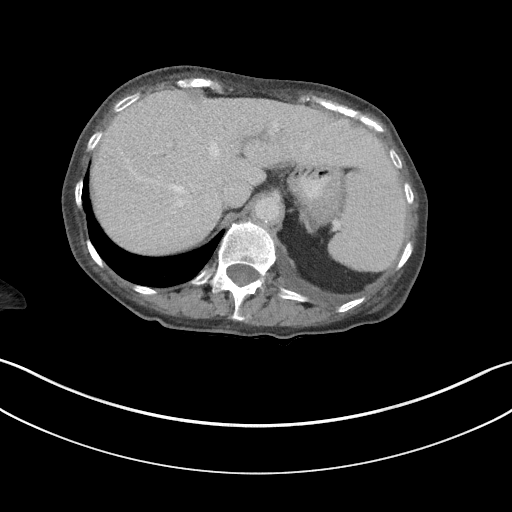
[im 83/88  soft-tissue]
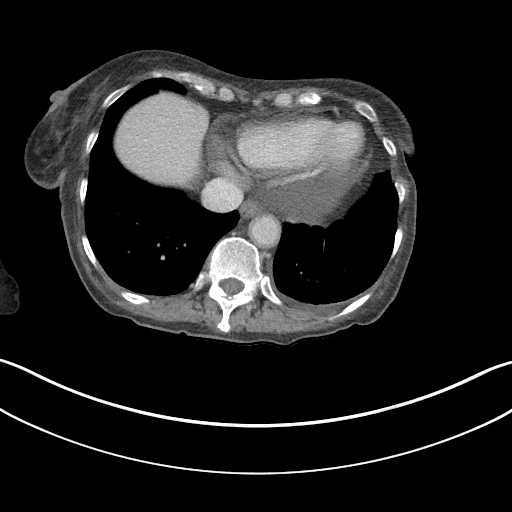

[Series 7: coronal st · coronal · 0.61mm/px · 3 of 81 slices shown]
[im 36/81  soft-tissue]
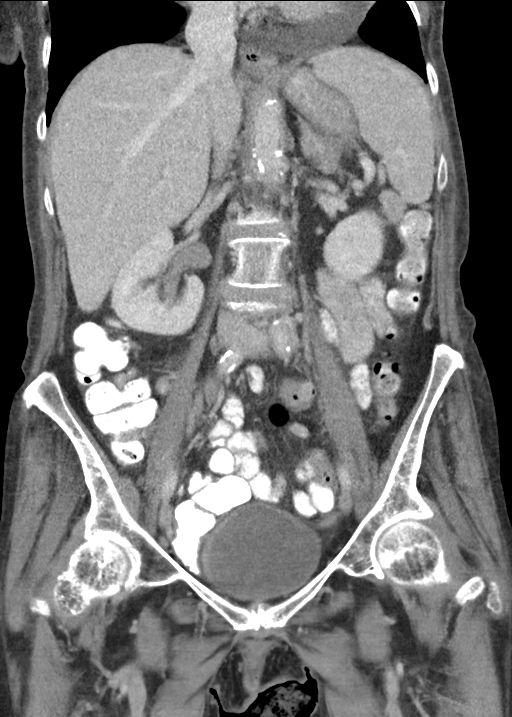
[im 45/81  soft-tissue]
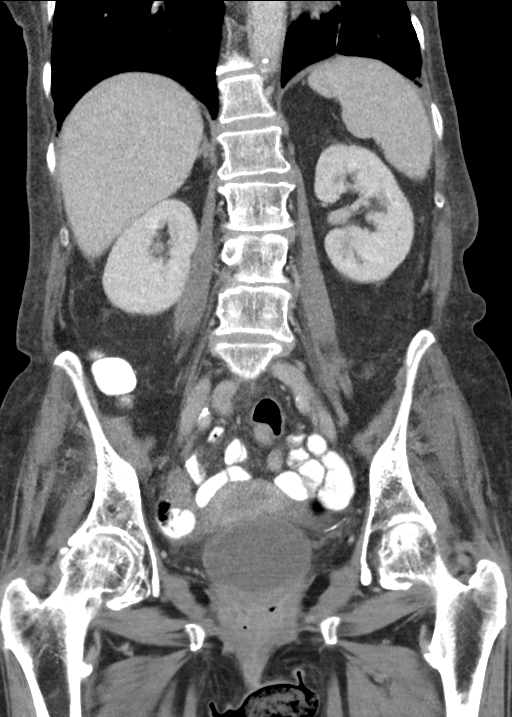
[im 54/81  soft-tissue]
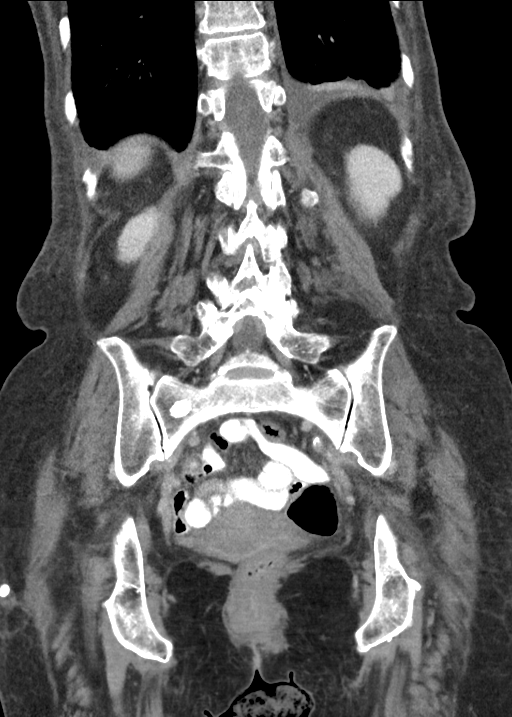

[16 of 46 positions shown; findings below may reference images not displayed]

FINDINGS: Lower Chest: Small pericardial effusion and small left pleural
effusion are noted.

Hepatobiliary: No hepatic masses identified. A few tiny calcified
gallstones are seen, however there is no evidence of cholecystitis
or biliary ductal dilatation.

Pancreas:  No mass or inflammatory changes.

Spleen: Within normal limits in size and appearance.

Adrenals/Urinary Tract: No masses identified. Small left renal cyst
noted. No evidence of ureteral calculi or hydronephrosis.

Stomach/Bowel: No evidence of obstruction, inflammatory process or
abnormal fluid collections. Normal appendix visualized. Mild
Diverticulosis is seen involving the sigmoid colon, however there is
no evidence of diverticulitis.

Vascular/Lymphatic: No pathologically enlarged lymph nodes. No
abdominal aortic aneurysm. Aortic atherosclerotic calcification
noted.

Reproductive:  No mass or other significant abnormality.

Other:  None.

Musculoskeletal:  No suspicious bone lesions identified.
IMPRESSION: No acute findings within the abdomen or pelvis.

Cholelithiasis. No radiographic evidence of cholecystitis.

Mild sigmoid diverticulosis. No radiographic evidence of
diverticulitis.

Small pericardial effusion and small left pleural effusion.

## 2022-06-25 IMAGING — CR DG CHEST 2V
2 series · 2 of 2 positions shown · non-contrast
Comparison: 10/05/2020

CLINICAL DATA: Confusion, hyponatremia

EXAM:
CHEST - 2 VIEW

[w pa chest]
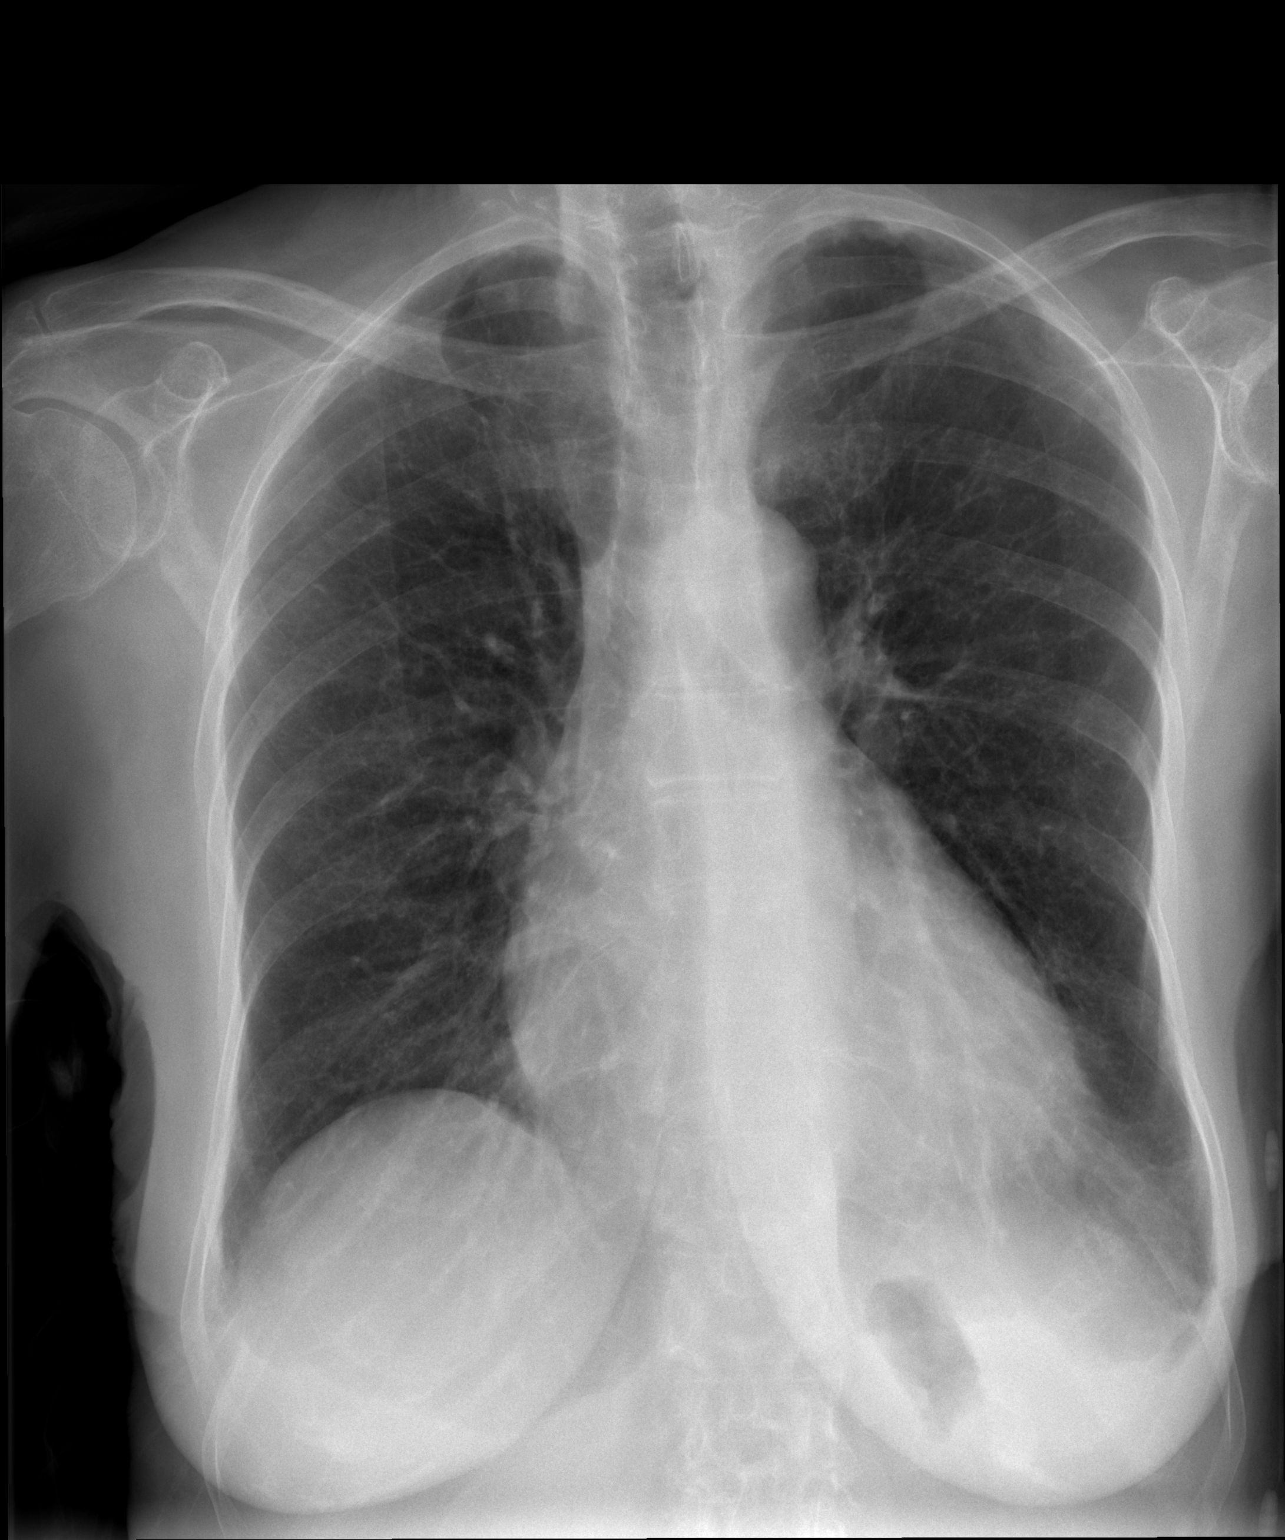

[w chest lat]
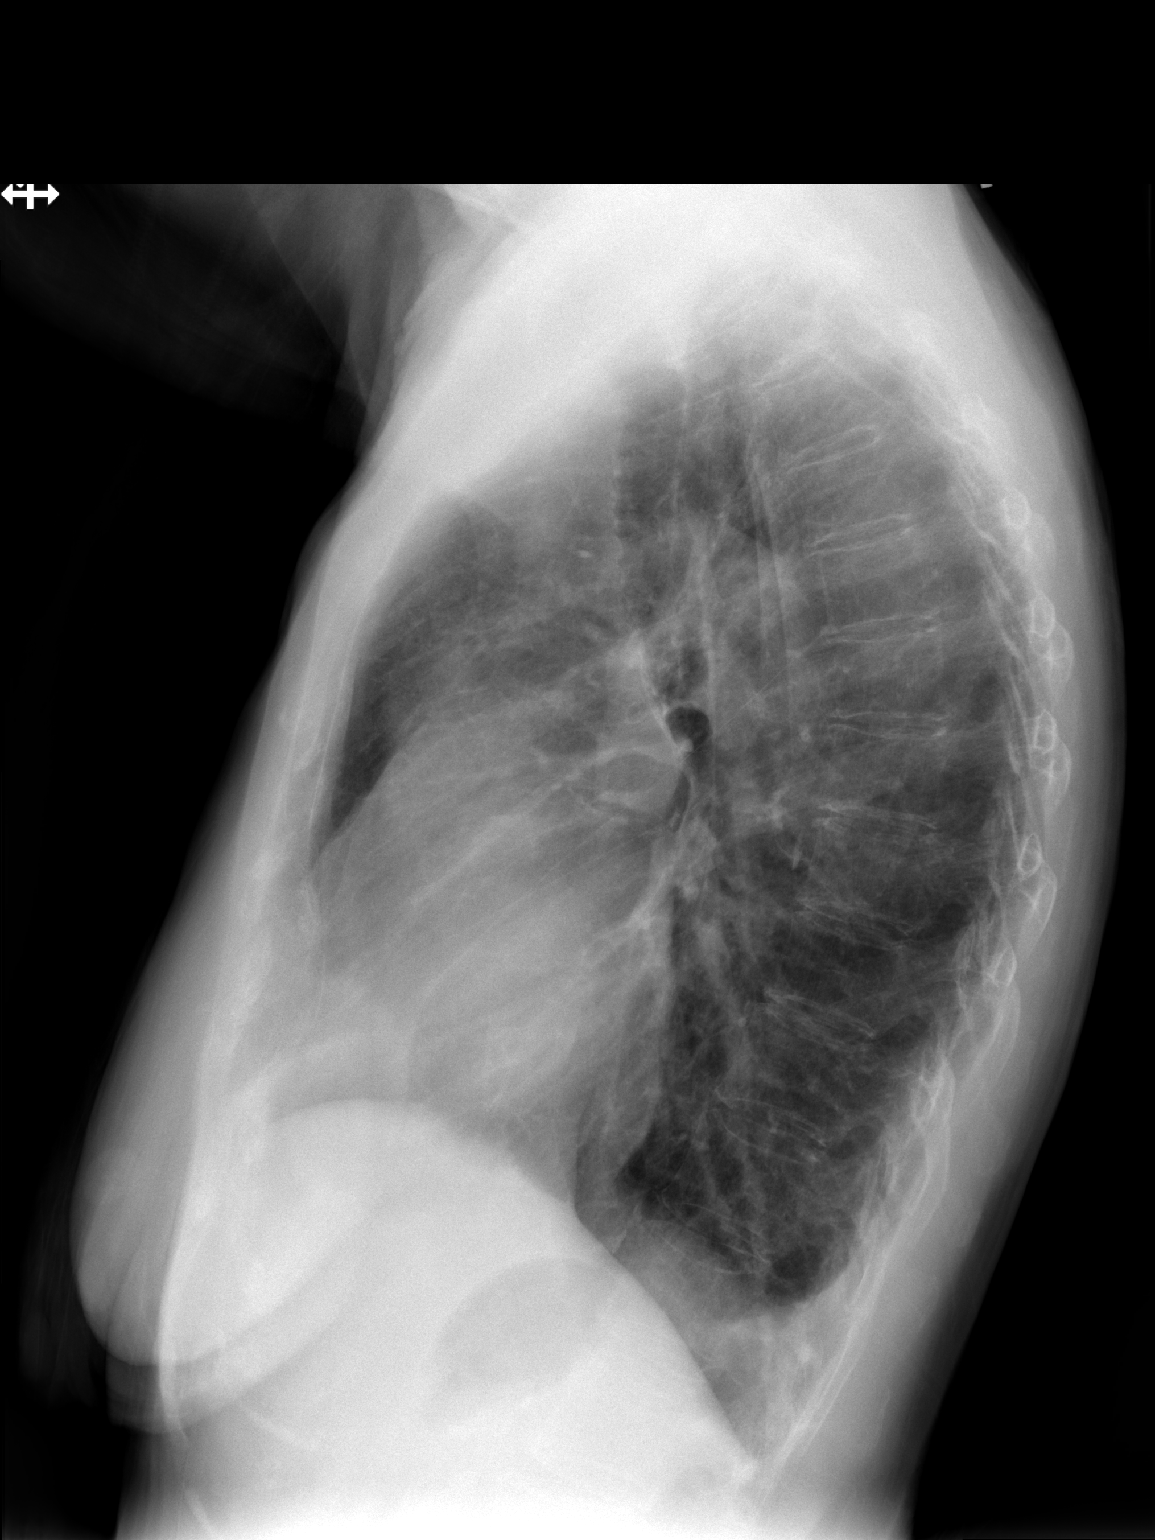

[2 of 2 positions shown; findings below may reference images not displayed]

FINDINGS: Frontal and lateral views of the chest demonstrate mild enlargement
of the cardiac silhouette. No airspace disease, effusion, or
pneumothorax. No acute bony abnormalities.
IMPRESSION: 1. No acute intrathoracic process.

## 2022-06-26 ENCOUNTER — Telehealth: Payer: Self-pay | Admitting: Family Medicine

## 2022-06-26 NOTE — Telephone Encounter (Signed)
Attempted to contact - line busy 

## 2022-07-03 ENCOUNTER — Encounter (INDEPENDENT_AMBULATORY_CARE_PROVIDER_SITE_OTHER): Payer: Medicare Other | Admitting: Ophthalmology

## 2022-07-03 DIAGNOSIS — H35343 Macular cyst, hole, or pseudohole, bilateral: Secondary | ICD-10-CM | POA: Diagnosis not present

## 2022-07-03 DIAGNOSIS — H35033 Hypertensive retinopathy, bilateral: Secondary | ICD-10-CM

## 2022-07-03 DIAGNOSIS — I10 Essential (primary) hypertension: Secondary | ICD-10-CM

## 2022-07-03 DIAGNOSIS — H34833 Tributary (branch) retinal vein occlusion, bilateral, with macular edema: Secondary | ICD-10-CM

## 2022-09-10 ENCOUNTER — Ambulatory Visit (INDEPENDENT_AMBULATORY_CARE_PROVIDER_SITE_OTHER): Payer: Medicare Other | Admitting: Family Medicine

## 2022-09-10 ENCOUNTER — Encounter: Payer: Self-pay | Admitting: Family Medicine

## 2022-09-10 VITALS — BP 160/83 | HR 81 | Temp 98.3°F | Ht 60.0 in | Wt 111.0 lb

## 2022-09-10 DIAGNOSIS — E1165 Type 2 diabetes mellitus with hyperglycemia: Secondary | ICD-10-CM

## 2022-09-10 DIAGNOSIS — E1159 Type 2 diabetes mellitus with other circulatory complications: Secondary | ICD-10-CM

## 2022-09-10 DIAGNOSIS — I152 Hypertension secondary to endocrine disorders: Secondary | ICD-10-CM

## 2022-09-10 LAB — BAYER DCA HB A1C WAIVED: HB A1C (BAYER DCA - WAIVED): 5.6 % (ref 4.8–5.6)

## 2022-09-10 MED ORDER — AMLODIPINE BESYLATE 10 MG PO TABS: 10.0000 mg | ORAL_TABLET | Freq: Every day | ORAL | 2 refills | Status: DC

## 2022-09-10 NOTE — Patient Instructions (Signed)

## 2022-09-10 NOTE — Progress Notes (Signed)
Subjective:  [name], female    DOB: October 12, 1945, 76 y.o.   MRN: [id number]  Patient Care Team: Baruch Gouty, FNP as PCP - General (Family Medicine) Eloise Harman, DO as Consulting Physician (Internal Medicine) Wyatt Portela, MD as Consulting Physician (Oncology) [facility]   Chief Complaint:  Medical Management of Chronic Issues   HPI: Mckenzie [name] is a 76 y.o. female presenting on 09/10/2022 for Medical Management of Chronic Issues   1. Type 2 diabetes mellitus with hyperglycemia, without long-term current use of insulin (HCC) Last A1C normal, above 7 prior to that. Was on long term steroid therapy until recently. Feels the diabetes diagnosis was due to medications and not true diabetes. Denies polyuria, polyphagia, or polydipsia.   2. Hypertension associated with diabetes ([place]) Has been taking both medications in the mornings which has caused some low readings. Denies leg swelling, chest pain, headaches, or shortness of breath.     Relevant past medical, surgical, family, and social history reviewed and updated as indicated.  Allergies and medications reviewed and updated. Data reviewed: Chart in Epic.   Past Medical History:  Diagnosis Date   Anemia    low iron   Arthritis    Depression    Diabetes ([place]) 11/30/2021   High cholesterol    Hypertension    Retinal micro-aneurysm of right eye    Stroke ([place])    2 or 3 mini strokes    Past Surgical History:  Procedure Laterality Date   ARTERY BIOPSY Bilateral 12/15/2020   Procedure: BILATERAL TEMPORAL ARTERY BIOPSIES;  Surgeon: Angelia Mould, MD;  Location: [facility] OR;  Service: Vascular;  Laterality: Bilateral;   COLONOSCOPY WITH PROPOFOL N/A 12/09/2020   Procedure: COLONOSCOPY WITH PROPOFOL;  Surgeon: Gatha Mayer, MD;  Location: [facility] ENDOSCOPY;  Service: Endoscopy;  Laterality: N/A;   ESOPHAGOGASTRODUODENOSCOPY (EGD) WITH PROPOFOL N/A 12/09/2020    Procedure: ESOPHAGOGASTRODUODENOSCOPY (EGD) WITH PROPOFOL;  Surgeon: Gatha Mayer, MD;  Location: [place];  Service: Endoscopy;  Laterality: N/A;   EYE SURGERY     FINGER SURGERY Left    RING FINGER   IR THORACENTESIS ASP PLEURAL SPACE W/IMG GUIDE  12/13/2020   OPEN REDUCTION INTERNAL FIXATION (ORIF) DISTAL RADIAL FRACTURE Left 10/05/2020   Procedure: OPEN REDUCTION INTERNAL FIXATION (ORIF) DISTAL RADIAL FRACTURE;  Surgeon: Shona Needles, MD;  Location: [place];  Service: Orthopedics;  Laterality: Left;  supraclavicular block   POLYPECTOMY  12/09/2020   Procedure: POLYPECTOMY;  Surgeon: Gatha Mayer, MD;  Location: [facility] ENDOSCOPY;  Service: Endoscopy;;   SPINAL FUSION  08/10/2021   T10-L2   TUBAL LIGATION      Social History   Socioeconomic History   Marital status: Married    Spouse name: Not on file   Number of children: 3   Years of education: 12   Highest education level: Not on file  Occupational History   Not on file  Tobacco Use   Smoking status: Former    Packs/day: 0.10    Years: 1.00    Total pack years: 0.10    Types: Cigarettes    Quit date: 57    Years since quitting: 41.9   Smokeless tobacco: Never   Tobacco comments:    for a short period  Vaping Use   Vaping Use: Never used  Substance and Sexual Activity   Alcohol use: No   Drug use: No   Sexual activity: Not on  file  Other Topics Concern   Not on file  Social History Narrative   Right handed   Caffeine use: coffee daily   Social Determinants of Health   Financial Resource Strain: Not on file  Food Insecurity: Not on file  Transportation Needs: Not on file  Physical Activity: Not on file  Stress: Not on file  Social Connections: Not on file  Intimate Partner Violence: Not on file    Outpatient Encounter Medications as of 09/10/2022  Medication Sig   ARIPiprazole (ABILIFY) 5 MG tablet Take by mouth.   aspirin EC 81 MG tablet Take 81 mg by mouth daily. Swallow whole.   Bromfenac  Sodium 0.07 % SOLN Place 1 drop into the right eye every evening.   Calcium Carb-Cholecalciferol (CALCIUM+D3 PO) Take 2 tablets by mouth in the morning.   COMBIGAN 0.2-0.5 % ophthalmic solution Place 1 drop into both eyes every 12 (twelve) hours.   dorzolamide (TRUSOPT) 2 % ophthalmic solution Place 1 drop into the left eye 2 (two) times daily.   Ergocalciferol (VITAMIN D2) 50 MCG (2000 UT) TABS Take 1 tablet by mouth daily.   escitalopram (LEXAPRO) 10 MG tablet Take 1 tablet (10 mg total) by mouth daily after breakfast.   fluorometholone (FML) 0.1 % ophthalmic suspension Place 1 drop into the right eye 2 (two) times daily.   latanoprost (XALATAN) 0.005 % ophthalmic solution Place 1 drop into both eyes at bedtime.   lisinopril (ZESTRIL) 10 MG tablet Take 1 tablet (10 mg total) by mouth daily.   magnesium oxide (MAG-OX) 400 MG tablet Take 400 mg by mouth in the morning.   Multiple Vitamin (MULTIVITAMIN WITH MINERALS) TABS tablet Take 1 tablet by mouth in the morning. One A Day Multivitamin   risperiDONE (RISPERDAL) 0.25 MG tablet Take 0.25 mg by mouth 2 (two) times daily.   [DISCONTINUED] amLODipine (NORVASC) 10 MG tablet Take 1 tablet (10 mg total) by mouth daily.   [DISCONTINUED] metFORMIN (GLUCOPHAGE-XR) 500 MG 24 hr tablet Take 1 tablet (500 mg total) by mouth daily with breakfast.   amLODipine (NORVASC) 10 MG tablet Take 1 tablet (10 mg total) by mouth at bedtime.   [DISCONTINUED] benzonatate (TESSALON PERLES) 100 MG capsule Take 1 capsule (100 mg total) by mouth 3 (three) times daily as needed for cough.   [DISCONTINUED] buPROPion (WELLBUTRIN XL) 150 MG 24 hr tablet Take 1 tablet (150 mg total) by mouth daily.   [DISCONTINUED] haloperidol (HALDOL) 0.5 MG tablet Take 1 tablet (0.5 mg total) by mouth 2 (two) times daily at 8 am and 4 pm.   [DISCONTINUED] ondansetron (ZOFRAN) 4 MG tablet Take 1 tablet (4 mg total) by mouth every 8 (eight) hours as needed for nausea or vomiting. (Patient not  taking: Reported on 09/10/2022)   No facility-administered encounter medications on file as of 09/10/2022.    Allergies  Allergen Reactions   Aldomet [Methyldopa] Other (See Comments)    Flu-like symptoms   Fosamax [Alendronate Sodium] Other (See Comments)    Weakness/fatigue    Plavix [Clopidogrel] Other (See Comments)    Arm swelled, bleeding   Acetazolamide Rash   Buspar [Buspirone] Palpitations    Review of Systems  Constitutional:  Positive for activity change, appetite change and fatigue. Negative for chills, diaphoresis, fever and unexpected weight change.  HENT: Negative.    Eyes: Negative.  Negative for photophobia and visual disturbance.  Respiratory:  Negative for cough, chest tightness and shortness of breath.   Cardiovascular:  Negative for chest  pain, palpitations and leg swelling.  Gastrointestinal:  Negative for abdominal pain, blood in stool, constipation, diarrhea, nausea and vomiting.  Endocrine: Negative.   Genitourinary:  Negative for decreased urine volume, difficulty urinating, dysuria, frequency and urgency.  Musculoskeletal:  Positive for arthralgias, back pain and gait problem. Negative for myalgias.  Skin: Negative.   Allergic/Immunologic: Negative.   Neurological:  Positive for weakness. Negative for dizziness, tremors, seizures, syncope, facial asymmetry, speech difficulty, light-headedness, numbness and headaches.  Hematological: Negative.   Psychiatric/Behavioral:  Positive for agitation, confusion and sleep disturbance. Negative for hallucinations and suicidal ideas.   All other systems reviewed and are negative.       Objective:  BP (!) 160/83   Pulse 81   Temp 98.3 F (36.8 C)   Ht 5' (1.524 m)   Wt 111 lb (50.3 kg)   SpO2 95%   BMI 21.68 kg/m    Wt Readings from Last 3 Encounters:  09/10/22 111 lb (50.3 kg)  05/29/22 120 lb (54.4 kg)  01/22/22 118 lb (53.5 kg)    Physical Exam Vitals and nursing note reviewed.  Constitutional:       General: She is not in acute distress.    Appearance: Normal appearance. She is well-developed and well-groomed. She is ill-appearing (chronically). She is not toxic-appearing or diaphoretic.  HENT:     Head: Normocephalic and atraumatic.     Jaw: There is normal jaw occlusion.     Right Ear: Hearing normal.     Left Ear: Hearing normal.     Nose: Nose normal.     Mouth/Throat:     Lips: Pink.     Mouth: Mucous membranes are moist.     Pharynx: Uvula midline.  Eyes:     General: Lids are normal.     Conjunctiva/sclera:     Right eye: Right conjunctiva is injected.     Left eye: Left conjunctiva is injected.     Pupils: Pupils are equal, round, and reactive to light.  Neck:     Thyroid: No thyroid mass, thyromegaly or thyroid tenderness.     Vascular: No carotid bruit or JVD.     Trachea: Trachea and phonation normal.  Cardiovascular:     Rate and Rhythm: Normal rate and regular rhythm.     Chest Wall: PMI is not displaced.     Pulses: Normal pulses.     Heart sounds: Normal heart sounds. No murmur heard.    No friction rub. No gallop.  Pulmonary:     Effort: Pulmonary effort is normal. No respiratory distress.     Breath sounds: Normal breath sounds. No wheezing.  Abdominal:     General: There is no abdominal bruit.     Palpations: There is no hepatomegaly or splenomegaly.  Musculoskeletal:        General: Normal range of motion.     Cervical back: Normal range of motion and neck supple.     Right lower leg: No edema.     Left lower leg: No edema.  Lymphadenopathy:     Cervical: No cervical adenopathy.  Skin:    General: Skin is warm and dry.     Capillary Refill: Capillary refill takes less than 2 seconds.     Coloration: Skin is not cyanotic, jaundiced or pale.     Findings: No rash.  Neurological:     General: No focal deficit present.     Mental Status: She is alert.     Sensory: Sensation  is intact.     Motor: Motor function is intact.     Coordination:  Coordination is intact.     Gait: Gait abnormal (in wheelchair).     Deep Tendon Reflexes: Reflexes are normal and symmetric.  Psychiatric:        Attention and Perception: Attention and perception normal.        Mood and Affect: Affect normal.        Speech: Speech normal.        Behavior: Behavior is cooperative.        Cognition and Memory: Cognition is impaired. Memory is impaired.     Results for orders placed or performed in visit on 06/19/22  Urine Culture   Specimen: Urine   UR  Result Value Ref Range   Urine Culture, Routine Final report    Organism ID, Bacteria Comment        Pertinent labs & imaging results that were available during my care of the patient were reviewed by me and considered in my medical decision making.  Assessment & Plan:  Pritika was seen today for medical management of chronic issues.  Diagnoses and all orders for this visit:  Type 2 diabetes mellitus with hyperglycemia, without long-term current use of insulin (HCC) A1C 5.6 in office today. Will stop metformin. Aware to monitor for hyperglycemic signs/symptoms. Will repeat in 3 months to determine if continued medications are needed. Diet and exercise encouraged.  -     Bayer DCA Hb A1c Waived  Hypertension associated with diabetes ([place]) Low after taking both medications in the mornings. Will switch amlodipine to night time dosing and continue lisinopril in the mornings. Continue to monitor and report persistent high or low readings.  -     amLODipine (NORVASC) 10 MG tablet; Take 1 tablet (10 mg total) by mouth at bedtime.     Continue all other maintenance medications.  Follow up plan: Return in about 3 months (around 12/10/2022) for DM, all labs.   Continue healthy lifestyle choices, including diet (rich in fruits, vegetables, and lean proteins, and low in salt and simple carbohydrates) and exercise (at least 30 minutes of moderate physical activity daily).  Educational handout given for  DM  The above assessment and management plan was discussed with the patient. The patient verbalized understanding of and has agreed to the management plan. Patient is aware to call the clinic if they develop any new symptoms or if symptoms persist or worsen. Patient is aware when to return to the clinic for a follow-up visit. Patient educated on when it is appropriate to go to the emergency department.   Monia Pouch, FNP-C [facility] [phone]

## 2022-12-10 ENCOUNTER — Ambulatory Visit (INDEPENDENT_AMBULATORY_CARE_PROVIDER_SITE_OTHER): Payer: Medicare Other | Admitting: Family Medicine

## 2022-12-10 ENCOUNTER — Encounter: Payer: Self-pay | Admitting: Family Medicine

## 2022-12-10 VITALS — BP 158/78 | HR 71 | Temp 96.5°F

## 2022-12-10 DIAGNOSIS — E785 Hyperlipidemia, unspecified: Secondary | ICD-10-CM

## 2022-12-10 DIAGNOSIS — E1159 Type 2 diabetes mellitus with other circulatory complications: Secondary | ICD-10-CM

## 2022-12-10 DIAGNOSIS — I152 Hypertension secondary to endocrine disorders: Secondary | ICD-10-CM

## 2022-12-10 DIAGNOSIS — E1169 Type 2 diabetes mellitus with other specified complication: Secondary | ICD-10-CM

## 2022-12-10 DIAGNOSIS — E1165 Type 2 diabetes mellitus with hyperglycemia: Secondary | ICD-10-CM | POA: Diagnosis not present

## 2022-12-10 LAB — BAYER DCA HB A1C WAIVED: HB A1C (BAYER DCA - WAIVED): 6.2 % — ABNORMAL HIGH (ref 4.8–5.6)

## 2022-12-10 MED ORDER — LISINOPRIL 10 MG PO TABS: 10.0000 mg | ORAL_TABLET | Freq: Every day | ORAL | 3 refills | Status: DC

## 2022-12-10 MED ORDER — AMLODIPINE BESYLATE 10 MG PO TABS: 10.0000 mg | ORAL_TABLET | Freq: Every day | ORAL | 3 refills | Status: DC

## 2022-12-10 NOTE — Patient Instructions (Signed)

## 2022-12-10 NOTE — Progress Notes (Signed)
Subjective:  [name], female    DOB: 1946/05/30, 77 y.o.   MRN: [id number]  Patient Care Team: Baruch Gouty, FNP as PCP - General (Family Medicine) Eloise Harman, DO as Consulting Physician (Internal Medicine) Wyatt Portela, MD as Consulting Physician (Oncology) [facility]   Chief Complaint:  Diabetes (3 month follow up )   HPI: Mckenzie [name] is a 77 y.o. female presenting on 12/10/2022 for Diabetes (3 month follow up )    1. Type 2 diabetes mellitus with hyperglycemia, without long-term current use of insulin (HCC) Has been off of metformin since last visit. Does follow a fairly healthy diet. No polyuria, polydipsia, or polyphagia.   2. Hyperlipidemia associated with type 2 diabetes mellitus ([place]) No on statin therapy. Does follow a fairly healthy diet. No regular exercise.   3. Hypertension associated with diabetes ([place]) On lisinopril in the mornings and amlodipine in the evenings. Tolerating both well. No headaches, chest pain, visual changes, leg swelling, palpitations, or syncope.     Relevant past medical, surgical, family, and social history reviewed and updated as indicated.  Allergies and medications reviewed and updated. Data reviewed: Chart in Epic.   Past Medical History:  Diagnosis Date   Anemia    low iron   Arthritis    Depression    Diabetes ([place]) 11/30/2021   High cholesterol    Hypertension    Retinal micro-aneurysm of right eye    Stroke ([place])    2 or 3 mini strokes    Past Surgical History:  Procedure Laterality Date   ARTERY BIOPSY Bilateral 12/15/2020   Procedure: BILATERAL TEMPORAL ARTERY BIOPSIES;  Surgeon: Angelia Mould, MD;  Location: [facility] OR;  Service: Vascular;  Laterality: Bilateral;   COLONOSCOPY WITH PROPOFOL N/A 12/09/2020   Procedure: COLONOSCOPY WITH PROPOFOL;  Surgeon: Gatha Mayer, MD;  Location: [facility] ENDOSCOPY;  Service: Endoscopy;  Laterality: N/A;    ESOPHAGOGASTRODUODENOSCOPY (EGD) WITH PROPOFOL N/A 12/09/2020   Procedure: ESOPHAGOGASTRODUODENOSCOPY (EGD) WITH PROPOFOL;  Surgeon: Gatha Mayer, MD;  Location: [place];  Service: Endoscopy;  Laterality: N/A;   EYE SURGERY     FINGER SURGERY Left    RING FINGER   IR THORACENTESIS ASP PLEURAL SPACE W/IMG GUIDE  12/13/2020   OPEN REDUCTION INTERNAL FIXATION (ORIF) DISTAL RADIAL FRACTURE Left 10/05/2020   Procedure: OPEN REDUCTION INTERNAL FIXATION (ORIF) DISTAL RADIAL FRACTURE;  Surgeon: Shona Needles, MD;  Location: [place];  Service: Orthopedics;  Laterality: Left;  supraclavicular block   POLYPECTOMY  12/09/2020   Procedure: POLYPECTOMY;  Surgeon: Gatha Mayer, MD;  Location: [facility] ENDOSCOPY;  Service: Endoscopy;;   SPINAL FUSION  08/10/2021   T10-L2   TUBAL LIGATION      Social History   Socioeconomic History   Marital status: Married    Spouse name: Not on file   Number of children: 3   Years of education: 12   Highest education level: Not on file  Occupational History   Not on file  Tobacco Use   Smoking status: Former    Packs/day: 0.10    Years: 1.00    Total pack years: 0.10    Types: Cigarettes    Quit date: 52    Years since quitting: 42.2   Smokeless tobacco: Never   Tobacco comments:    for a short period  Vaping Use   Vaping Use: Never used  Substance and Sexual Activity   Alcohol  use: No   Drug use: No   Sexual activity: Not on file  Other Topics Concern   Not on file  Social History Narrative   Right handed   Caffeine use: coffee daily   Social Determinants of Health   Financial Resource Strain: Not on file  Food Insecurity: Not on file  Transportation Needs: Not on file  Physical Activity: Not on file  Stress: Not on file  Social Connections: Not on file  Intimate Partner Violence: Not on file    Outpatient Encounter Medications as of 12/10/2022  Medication Sig   ARIPiprazole (ABILIFY) 5 MG tablet Take by mouth.   aspirin EC 81 MG  tablet Take 81 mg by mouth daily. Swallow whole.   Bromfenac Sodium 0.07 % SOLN Place 1 drop into the right eye every evening.   Calcium Carb-Cholecalciferol (CALCIUM+D3 PO) Take 2 tablets by mouth in the morning.   COMBIGAN 0.2-0.5 % ophthalmic solution Place 1 drop into both eyes every 12 (twelve) hours.   dorzolamide (TRUSOPT) 2 % ophthalmic solution Place 1 drop into the left eye 2 (two) times daily.   Ergocalciferol (VITAMIN D2) 50 MCG (2000 UT) TABS Take 1 tablet by mouth daily.   escitalopram (LEXAPRO) 10 MG tablet Take 1 tablet (10 mg total) by mouth daily after breakfast.   fluorometholone (FML) 0.1 % ophthalmic suspension Place 1 drop into the right eye 2 (two) times daily.   latanoprost (XALATAN) 0.005 % ophthalmic solution Place 1 drop into both eyes at bedtime.   magnesium oxide (MAG-OX) 400 MG tablet Take 400 mg by mouth in the morning.   Multiple Vitamin (MULTIVITAMIN WITH MINERALS) TABS tablet Take 1 tablet by mouth in the morning. One A Day Multivitamin   risperiDONE (RISPERDAL) 0.25 MG tablet Take 0.25 mg by mouth 2 (two) times daily.   [DISCONTINUED] amLODipine (NORVASC) 10 MG tablet Take 1 tablet (10 mg total) by mouth at bedtime.   [DISCONTINUED] lisinopril (ZESTRIL) 10 MG tablet Take 1 tablet (10 mg total) by mouth daily.   amLODipine (NORVASC) 10 MG tablet Take 1 tablet (10 mg total) by mouth at bedtime.   lisinopril (ZESTRIL) 10 MG tablet Take 1 tablet (10 mg total) by mouth daily.   No facility-administered encounter medications on file as of 12/10/2022.    Allergies  Allergen Reactions   Aldomet [Methyldopa] Other (See Comments)    Flu-like symptoms   Fosamax [Alendronate Sodium] Other (See Comments)    Weakness/fatigue    Plavix [Clopidogrel] Other (See Comments)    Arm swelled, bleeding   Acetazolamide Rash   Buspar [Buspirone] Palpitations    Review of Systems  Constitutional:  Negative for activity change, appetite change, chills, diaphoresis, fever and  unexpected weight change.  HENT: Negative.    Eyes: Negative.  Negative for photophobia and visual disturbance.  Respiratory:  Negative for cough, chest tightness and shortness of breath.   Cardiovascular:  Negative for palpitations and leg swelling.  Gastrointestinal:  Negative for abdominal pain, blood in stool, constipation, diarrhea, nausea and vomiting.  Endocrine: Negative.   Genitourinary:  Negative for decreased urine volume, difficulty urinating, dysuria, frequency and urgency.  Musculoskeletal:  Negative for arthralgias and myalgias.  Skin: Negative.   Allergic/Immunologic: Negative.   Neurological:  Negative for syncope, facial asymmetry, light-headedness and numbness.  Hematological: Negative.   Psychiatric/Behavioral:  Negative for agitation, behavioral problems, decreased concentration, dysphoric mood, hallucinations, self-injury, sleep disturbance and suicidal ideas. The patient is not hyperactive.   All other systems reviewed  and are negative.       Objective:  BP (!) 158/78   Pulse 71   Temp (!) 96.5 F (35.8 C) (Temporal)   SpO2 98%    Wt Readings from Last 3 Encounters:  09/10/22 111 lb (50.3 kg)  05/29/22 120 lb (54.4 kg)  01/22/22 118 lb (53.5 kg)    Physical Exam Vitals and nursing note reviewed.  Constitutional:      General: She is not in acute distress.    Appearance: She is normal weight. She is ill-appearing (chronically ill). She is not toxic-appearing or diaphoretic.  HENT:     Head: Normocephalic and atraumatic.  Eyes:     Conjunctiva/sclera:     Right eye: Right conjunctiva is injected.     Left eye: Left conjunctiva is injected.     Pupils: Pupils are equal, round, and reactive to light.  Cardiovascular:     Rate and Rhythm: Normal rate and regular rhythm.  Pulmonary:     Effort: Pulmonary effort is normal.     Breath sounds: Normal breath sounds.  Musculoskeletal:     Right lower leg: No edema.     Left lower leg: No edema.      Comments: Kyphosis   Skin:    General: Skin is warm and dry.     Capillary Refill: Capillary refill takes less than 2 seconds.  Neurological:     General: No focal deficit present.     Mental Status: She is alert. Mental status is at baseline.     Gait: Gait abnormal (in wheelchair).  Psychiatric:        Attention and Perception: Attention normal.        Mood and Affect: Mood normal.        Behavior: Behavior is cooperative.        Cognition and Memory: Cognition is impaired. Memory is impaired.     Results for orders placed or performed in visit on 09/10/22  Bayer DCA Hb A1c Waived  Result Value Ref Range   HB A1C (BAYER DCA - WAIVED) 5.6 4.8 - 5.6 %       Pertinent labs & imaging results that were available during my care of the patient were reviewed by me and considered in my medical decision making.  Assessment & Plan:  Orli was seen today for diabetes.  Diagnoses and all orders for this visit:  Type 2 diabetes mellitus with hyperglycemia, without long-term current use of insulin ([place]) Has been off of metformin since last visit. A1C 6.2 today. Diet controlled. Will repeat labs in 3 months, if elevated, will restart medications.  -     Bayer DCA Hb A1c Waived  Hyperlipidemia associated with type 2 diabetes mellitus ([place]) Diet encouraged - increase intake of fresh fruits and vegetables, increase intake of lean proteins. Bake, broil, or grill foods. Avoid fried, greasy, and fatty foods. Avoid fast foods. Increase intake of fiber-rich whole grains. Exercise encouraged - at least 150 minutes per week and advance as tolerated.  Goal BMI < 25. Follow up in 3-6 months as discussed.  -     Lipid panel  Hypertension associated with diabetes ([place]) BP well controlled. Changes were not made in regimen today. Goal BP is 130/80. Pt aware to report any persistent high or low readings. DASH diet and exercise encouraged. Exercise at least 150 minutes per week and increase as tolerated.  Goal BMI > 25. Stress management encouraged. Avoid nicotine and tobacco product use. Avoid excessive  alcohol and NSAID's. Avoid more than 2000 mg of sodium daily. Medications as prescribed. Follow up as scheduled.  -     amLODipine (NORVASC) 10 MG tablet; Take 1 tablet (10 mg total) by mouth at bedtime. -     lisinopril (ZESTRIL) 10 MG tablet; Take 1 tablet (10 mg total) by mouth daily.     Continue all other maintenance medications.  Follow up plan: Return in 3 months (on 03/12/2023) for DM.   Continue healthy lifestyle choices, including diet (rich in fruits, vegetables, and lean proteins, and low in salt and simple carbohydrates) and exercise (at least 30 minutes of moderate physical activity daily).  Educational handout given for DM  The above assessment and management plan was discussed with the patient. The patient verbalized understanding of and has agreed to the management plan. Patient is aware to call the clinic if they develop any new symptoms or if symptoms persist or worsen. Patient is aware when to return to the clinic for a follow-up visit. Patient educated on when it is appropriate to go to the emergency department.   Monia Pouch, FNP-C [facility] [phone]

## 2022-12-11 LAB — LIPID PANEL
Chol/HDL Ratio: 4.9 ratio — ABNORMAL HIGH (ref 0.0–4.4)
Cholesterol, Total: 249 mg/dL — ABNORMAL HIGH (ref 100–199)
HDL: 51 mg/dL (ref >39)
LDL Chol Calc (NIH): 159 mg/dL — ABNORMAL HIGH (ref 0–99)
Triglycerides: 214 mg/dL — ABNORMAL HIGH (ref 0–149)
VLDL Cholesterol Cal: 39 mg/dL (ref 5–40)

## 2023-02-24 NOTE — Telephone Encounter (Signed)
Erroneous encounter will close.

## 2023-03-18 ENCOUNTER — Ambulatory Visit (INDEPENDENT_AMBULATORY_CARE_PROVIDER_SITE_OTHER): Payer: Medicare Other | Admitting: Family Medicine

## 2023-03-18 ENCOUNTER — Encounter: Payer: Self-pay | Admitting: Family Medicine

## 2023-03-18 VITALS — BP 163/73 | HR 67 | Temp 96.0°F

## 2023-03-18 DIAGNOSIS — E785 Hyperlipidemia, unspecified: Secondary | ICD-10-CM | POA: Diagnosis not present

## 2023-03-18 DIAGNOSIS — E1165 Type 2 diabetes mellitus with hyperglycemia: Secondary | ICD-10-CM

## 2023-03-18 DIAGNOSIS — I152 Hypertension secondary to endocrine disorders: Secondary | ICD-10-CM

## 2023-03-18 DIAGNOSIS — E1159 Type 2 diabetes mellitus with other circulatory complications: Secondary | ICD-10-CM

## 2023-03-18 DIAGNOSIS — E1169 Type 2 diabetes mellitus with other specified complication: Secondary | ICD-10-CM

## 2023-03-18 LAB — BAYER DCA HB A1C WAIVED: HB A1C (BAYER DCA - WAIVED): 5.2 % (ref 4.8–5.6)

## 2023-03-18 NOTE — Progress Notes (Signed)
Subjective:  [name], female    DOB: October 05, 1946, 77 y.o.   MRN: [id number]  Patient Care Team: Sonny Masters, FNP as PCP - General (Family Medicine) Lanelle Bal, DO as Consulting Physician (Internal Medicine) Benjiman Core, MD (Inactive) as Consulting Physician (Oncology) [facility]   Chief Complaint:  Diabetes (3 month follow up )   HPI: Mckenzie [name] is a 77 y.o. female presenting on 03/18/2023 for Diabetes (3 month follow up )  1. Type 2 diabetes mellitus with hyperglycemia, without long-term current use of insulin (HCC) Last A1C 6.2. Today is 5.2%  Feels the diabetes diagnosis was due to medications and not true diabetes. Denies polyuria, polyphagia, or polydipsia.   2. Hyperlipidemia associated with type 2 diabetes mellitus (HCC) Diet - Heart healthy Exercise  limited secondary to wheelchair bound can stand turn and pivot for transfers  3. Hypertension associated with diabetes (HCC) Has been splittin medication morning and afternood which has caused some low readings. Denies leg swelling, chest pain, headaches, or shortness of breath.     Relevant past medical, surgical, family, and social history reviewed and updated as indicated.  Allergies and medications reviewed and updated. Data reviewed: Chart in Epic.   Past Medical History:  Diagnosis Date   Anemia    low iron   Arthritis    Depression    Diabetes (HCC) 11/30/2021   High cholesterol    Hypertension    Retinal micro-aneurysm of right eye    Stroke (HCC)    2 or 3 mini strokes    Past Surgical History:  Procedure Laterality Date   ARTERY BIOPSY Bilateral 12/15/2020   Procedure: BILATERAL TEMPORAL ARTERY BIOPSIES;  Surgeon: Chuck Hint, MD;  Location: [facility] OR;  Service: Vascular;  Laterality: Bilateral;   COLONOSCOPY WITH PROPOFOL N/A 12/09/2020   Procedure: COLONOSCOPY WITH PROPOFOL;  Surgeon: Iva Boop, MD;  Location: [facility] ENDOSCOPY;   Service: Endoscopy;  Laterality: N/A;   ESOPHAGOGASTRODUODENOSCOPY (EGD) WITH PROPOFOL N/A 12/09/2020   Procedure: ESOPHAGOGASTRODUODENOSCOPY (EGD) WITH PROPOFOL;  Surgeon: Iva Boop, MD;  Location: [facility] ENDOSCOPY;  Service: Endoscopy;  Laterality: N/A;   EYE SURGERY     FINGER SURGERY Left    RING FINGER   IR THORACENTESIS ASP PLEURAL SPACE W/IMG GUIDE  12/13/2020   OPEN REDUCTION INTERNAL FIXATION (ORIF) DISTAL RADIAL FRACTURE Left 10/05/2020   Procedure: OPEN REDUCTION INTERNAL FIXATION (ORIF) DISTAL RADIAL FRACTURE;  Surgeon: Roby Lofts, MD;  Location: [facility] OR;  Service: Orthopedics;  Laterality: Left;  supraclavicular block   POLYPECTOMY  12/09/2020   Procedure: POLYPECTOMY;  Surgeon: Iva Boop, MD;  Location: [facility] ENDOSCOPY;  Service: Endoscopy;;   SPINAL FUSION  08/10/2021   T10-L2   TUBAL LIGATION      Social History   Socioeconomic History   Marital status: Married    Spouse name: Not on file   Number of children: 3   Years of education: 12   Highest education level: Not on file  Occupational History   Not on file  Tobacco Use   Smoking status: Former    Packs/day: 0.10    Years: 1.00    Additional pack years: 0.00    Total pack years: 0.10    Types: Cigarettes    Quit date: 55    Years since quitting: 42.4   Smokeless tobacco: Never   Tobacco comments:    for a short period  Advertising account planner  Vaping Use: Never used  Substance and Sexual Activity   Alcohol use: No   Drug use: No   Sexual activity: Not on file  Other Topics Concern   Not on file  Social History Narrative   Right handed   Caffeine use: coffee daily   Social Determinants of Health   Financial Resource Strain: Not on file  Food Insecurity: Not on file  Transportation Needs: Not on file  Physical Activity: Not on file  Stress: Not on file  Social Connections: Not on file  Intimate Partner Violence: Not on file    Outpatient Encounter Medications as of 03/18/2023  Medication Sig    amLODipine (NORVASC) 10 MG tablet Take 1 tablet (10 mg total) by mouth at bedtime.   ARIPiprazole (ABILIFY) 5 MG tablet Take by mouth.   aspirin EC 81 MG tablet Take 81 mg by mouth daily. Swallow whole.   Bromfenac Sodium 0.07 % SOLN Place 1 drop into the right eye every evening.   Calcium Carb-Cholecalciferol (CALCIUM+D3 PO) Take 2 tablets by mouth in the morning.   COMBIGAN 0.2-0.5 % ophthalmic solution Place 1 drop into both eyes every 12 (twelve) hours.   dorzolamide (TRUSOPT) 2 % ophthalmic solution Place 1 drop into the left eye 2 (two) times daily.   Ergocalciferol (VITAMIN D2) 50 MCG (2000 UT) TABS Take 1 tablet by mouth daily.   escitalopram (LEXAPRO) 10 MG tablet Take 1 tablet (10 mg total) by mouth daily after breakfast.   fluorometholone (FML) 0.1 % ophthalmic suspension Place 1 drop into the right eye 2 (two) times daily.   latanoprost (XALATAN) 0.005 % ophthalmic solution Place 1 drop into both eyes at bedtime.   lisinopril (ZESTRIL) 10 MG tablet Take 1 tablet (10 mg total) by mouth daily.   magnesium oxide (MAG-OX) 400 MG tablet Take 400 mg by mouth in the morning.   Multiple Vitamin (MULTIVITAMIN WITH MINERALS) TABS tablet Take 1 tablet by mouth in the morning. One A Day Multivitamin   risperiDONE (RISPERDAL) 0.25 MG tablet Take 0.25 mg by mouth 2 (two) times daily.   No facility-administered encounter medications on file as of 03/18/2023.    Allergies  Allergen Reactions   Aldomet [Methyldopa] Other (See Comments)    Flu-like symptoms   Fosamax [Alendronate Sodium] Other (See Comments)    Weakness/fatigue    Plavix [Clopidogrel] Other (See Comments)    Arm swelled, bleeding   Acetazolamide Rash   Buspar [Buspirone] Palpitations    Review of Systems  Constitutional: Negative.   HENT: Negative.    Eyes: Negative.   Respiratory: Negative.  Negative for cough, choking, chest tightness, shortness of breath, wheezing and stridor.   Cardiovascular: Negative.  Negative  for chest pain, palpitations and leg swelling.       Husband concerned about low BP that occures after administration of BP meds 83-90 systolic  Gastrointestinal: Negative.   Endocrine: Negative.   Genitourinary: Negative.   Musculoskeletal: Negative.   Allergic/Immunologic: Negative.   Neurological: Negative.   Hematological: Negative.   Psychiatric/Behavioral: Negative.          Objective:  BP (!) 163/73   Pulse 67   Temp (!) 96 F (35.6 C) (Temporal)   SpO2 97%    Wt Readings from Last 3 Encounters:  09/10/22 111 lb (50.3 kg)  05/29/22 120 lb (54.4 kg)  01/22/22 118 lb (53.5 kg)    Physical Exam Constitutional:      Appearance: Normal appearance. She is normal weight.  HENT:     Head: Normocephalic and atraumatic.     Nose: Nose normal.     Mouth/Throat:     Mouth: Mucous membranes are moist.     Pharynx: Oropharynx is clear.  Eyes:     Conjunctiva/sclera: Conjunctivae normal.     Pupils: Pupils are equal, round, and reactive to light.  Cardiovascular:     Rate and Rhythm: Normal rate and regular rhythm.  Pulmonary:     Effort: Pulmonary effort is normal. No respiratory distress.     Breath sounds: Normal breath sounds.  Abdominal:     General: Abdomen is flat.     Palpations: Abdomen is soft.  Musculoskeletal:        General: Normal range of motion.     Cervical back: Neck supple.     Right lower leg: No edema.     Left lower leg: No edema.     Comments: Severe kyphois   Skin:    General: Skin is warm.  Neurological:     General: No focal deficit present.     Mental Status: She is alert and oriented to person, place, and time.  Psychiatric:        Mood and Affect: Mood normal.     Comments: Pt states she is anxious     Results for orders placed or performed in visit on 03/18/23  Bayer DCA Hb A1c Waived  Result Value Ref Range   HB A1C (BAYER DCA - WAIVED) 5.2 4.8 - 5.6 %       Pertinent labs & imaging results that were available during my  care of the patient were reviewed by me and considered in my medical decision making.  Assessment & Plan:  Chany was seen today for diabetes.  Diagnoses and all orders for this visit:  Type 2 diabetes mellitus with hyperglycemia, without long-term current use of insulin (HCC) A1C 5.7. not on medications. Continue with diet and exercise.  -     Bayer DCA Hb A1c Waived  Hyperlipidemia associated with type 2 diabetes mellitus (HCC) Diet encouraged - increase intake of fresh fruits and vegetables, increase intake of lean proteins. Bake, broil, or grill foods. Avoid fried, greasy, and fatty foods. Avoid fast foods. Increase intake of fiber-rich whole grains. Exercise encouraged - at least 150 minutes per week and advance as tolerated.  Goal BMI < 25. Continue medications as prescribed. Follow up in 3-6 months as discussed.  -     Lipid panel  Hypertension associated with diabetes (HCC) BP well controlled. Changes were not made in regimen today. Goal BP is 130/80. Pt aware to report any persistent high or low readings. DASH diet and exercise encouraged. Exercise at least 150 minutes per week and increase as tolerated. Goal BMI > 25. Stress management encouraged. Avoid nicotine and tobacco product use. Avoid excessive alcohol and NSAID's. Avoid more than 2000 mg of sodium daily. Medications as prescribed. Follow up as scheduled.  -     Bayer DCA Hb A1c Waived  Family asking to have HTM managed by Dr  Wolfgang Phoenix. Referral Call and make appointment  Continue all other maintenance medications.  Follow up plan: Return in 3 months (on 06/18/2023) for chronic follow up, schedule AWV.   Continue healthy lifestyle choices, including diet (rich in fruits, vegetables, and lean proteins, and low in salt and simple carbohydrates) and exercise (at least 30 minutes of moderate physical activity daily).  Educational handout given for DM HTM   The above  assessment and management plan was discussed with the  patient. The patient verbalized understanding of and has agreed to the management plan. Patient is aware to call the clinic if they develop any new symptoms or if symptoms persist or worsen. Patient is aware when to return to the clinic for a follow-up visit. Patient educated on when it is appropriate to go to the emergency department.   Kari Baars, FNP-C [facility] [phone]

## 2023-03-18 NOTE — Patient Instructions (Addendum)

## 2023-03-19 LAB — LIPID PANEL
Chol/HDL Ratio: 4.4 ratio (ref 0.0–4.4)
Cholesterol, Total: 234 mg/dL — ABNORMAL HIGH (ref 100–199)
HDL: 53 mg/dL (ref >39)
LDL Chol Calc (NIH): 153 mg/dL — ABNORMAL HIGH (ref 0–99)
Triglycerides: 156 mg/dL — ABNORMAL HIGH (ref 0–149)
VLDL Cholesterol Cal: 28 mg/dL (ref 5–40)

## 2023-04-12 ENCOUNTER — Emergency Department (HOSPITAL_BASED_OUTPATIENT_CLINIC_OR_DEPARTMENT_OTHER): Payer: Medicare Other

## 2023-04-12 ENCOUNTER — Inpatient Hospital Stay (HOSPITAL_BASED_OUTPATIENT_CLINIC_OR_DEPARTMENT_OTHER)
Admission: EM | Admit: 2023-04-12 | Discharge: 2023-04-15 | DRG: 193 | Disposition: A | Discharge status: Home / Self Care | Payer: Medicare Other | Attending: Internal Medicine | Admitting: Internal Medicine

## 2023-04-12 ENCOUNTER — Other Ambulatory Visit: Payer: Self-pay

## 2023-04-12 ENCOUNTER — Encounter (HOSPITAL_BASED_OUTPATIENT_CLINIC_OR_DEPARTMENT_OTHER): Payer: Self-pay

## 2023-04-12 DIAGNOSIS — Z825 Family history of asthma and other chronic lower respiratory diseases: Secondary | ICD-10-CM

## 2023-04-12 DIAGNOSIS — I10 Essential (primary) hypertension: Secondary | ICD-10-CM | POA: Diagnosis present

## 2023-04-12 DIAGNOSIS — Z79899 Other long term (current) drug therapy: Secondary | ICD-10-CM | POA: Diagnosis not present

## 2023-04-12 DIAGNOSIS — Z1152 Encounter for screening for COVID-19: Secondary | ICD-10-CM | POA: Diagnosis not present

## 2023-04-12 DIAGNOSIS — R54 Age-related physical debility: Secondary | ICD-10-CM | POA: Diagnosis present

## 2023-04-12 DIAGNOSIS — Z8249 Family history of ischemic heart disease and other diseases of the circulatory system: Secondary | ICD-10-CM | POA: Diagnosis not present

## 2023-04-12 DIAGNOSIS — Z7982 Long term (current) use of aspirin: Secondary | ICD-10-CM

## 2023-04-12 DIAGNOSIS — J9601 Acute respiratory failure with hypoxia: Secondary | ICD-10-CM | POA: Diagnosis present

## 2023-04-12 DIAGNOSIS — Z8616 Personal history of COVID-19: Secondary | ICD-10-CM | POA: Diagnosis not present

## 2023-04-12 DIAGNOSIS — Z888 Allergy status to other drugs, medicaments and biological substances status: Secondary | ICD-10-CM | POA: Diagnosis not present

## 2023-04-12 DIAGNOSIS — J189 Pneumonia, unspecified organism: Principal | ICD-10-CM | POA: Diagnosis present

## 2023-04-12 DIAGNOSIS — Z83438 Family history of other disorder of lipoprotein metabolism and other lipidemia: Secondary | ICD-10-CM

## 2023-04-12 DIAGNOSIS — Z87891 Personal history of nicotine dependence: Secondary | ICD-10-CM | POA: Diagnosis not present

## 2023-04-12 DIAGNOSIS — E1165 Type 2 diabetes mellitus with hyperglycemia: Secondary | ICD-10-CM | POA: Diagnosis present

## 2023-04-12 DIAGNOSIS — Z993 Dependence on wheelchair: Secondary | ICD-10-CM

## 2023-04-12 DIAGNOSIS — Z833 Family history of diabetes mellitus: Secondary | ICD-10-CM | POA: Diagnosis not present

## 2023-04-12 DIAGNOSIS — E7800 Pure hypercholesterolemia, unspecified: Secondary | ICD-10-CM | POA: Diagnosis present

## 2023-04-12 DIAGNOSIS — E876 Hypokalemia: Secondary | ICD-10-CM | POA: Diagnosis present

## 2023-04-12 DIAGNOSIS — F4323 Adjustment disorder with mixed anxiety and depressed mood: Secondary | ICD-10-CM | POA: Diagnosis present

## 2023-04-12 DIAGNOSIS — Z981 Arthrodesis status: Secondary | ICD-10-CM

## 2023-04-12 DIAGNOSIS — E1169 Type 2 diabetes mellitus with other specified complication: Secondary | ICD-10-CM | POA: Diagnosis present

## 2023-04-12 DIAGNOSIS — Z8673 Personal history of transient ischemic attack (TIA), and cerebral infarction without residual deficits: Secondary | ICD-10-CM

## 2023-04-12 DIAGNOSIS — H4050X0 Glaucoma secondary to other eye disorders, unspecified eye, stage unspecified: Secondary | ICD-10-CM | POA: Diagnosis present

## 2023-04-12 LAB — COMPREHENSIVE METABOLIC PANEL
ALT: 11 U/L (ref 0–44)
AST: 20 U/L (ref 15–41)
Albumin: 3.8 g/dL (ref 3.5–5.0)
Alkaline Phosphatase: 63 U/L (ref 38–126)
Anion gap: 8 (ref 5–15)
BUN: 13 mg/dL (ref 8–23)
CO2: 31 mmol/L (ref 22–32)
Calcium: 9.8 mg/dL (ref 8.9–10.3)
Chloride: 97 mmol/L — ABNORMAL LOW (ref 98–111)
Creatinine, Ser: 0.64 mg/dL (ref 0.44–1.00)
GFR, Estimated: >60 mL/min (ref >60)
Glucose, Bld: 139 mg/dL — ABNORMAL HIGH (ref 70–99)
Potassium: 3 mmol/L — ABNORMAL LOW (ref 3.5–5.1)
Sodium: 136 mmol/L (ref 135–145)
Total Bilirubin: 0.4 mg/dL (ref 0.3–1.2)
Total Protein: 7 g/dL (ref 6.5–8.1)

## 2023-04-12 LAB — CBC WITH DIFFERENTIAL/PLATELET
Abs Immature Granulocytes: 0.02 10*3/uL (ref 0.00–0.07)
Basophils Absolute: 0 10*3/uL (ref 0.0–0.1)
Basophils Relative: 0 %
Eosinophils Absolute: 0 10*3/uL (ref 0.0–0.5)
Eosinophils Relative: 0 %
HCT: 38 % (ref 36.0–46.0)
Hemoglobin: 12.7 g/dL (ref 12.0–15.0)
Immature Granulocytes: 0 %
Lymphocytes Relative: 7 %
Lymphs Abs: 0.7 10*3/uL (ref 0.7–4.0)
MCH: 29.1 pg (ref 26.0–34.0)
MCHC: 33.4 g/dL (ref 30.0–36.0)
MCV: 87 fL (ref 80.0–100.0)
Monocytes Absolute: 0.4 10*3/uL (ref 0.1–1.0)
Monocytes Relative: 4 %
Neutro Abs: 8.1 10*3/uL — ABNORMAL HIGH (ref 1.7–7.7)
Neutrophils Relative %: 89 %
Platelets: 186 10*3/uL (ref 150–400)
RBC: 4.37 MIL/uL (ref 3.87–5.11)
RDW: 13.2 % (ref 11.5–15.5)
WBC: 9.2 10*3/uL (ref 4.0–10.5)
nRBC: 0 % (ref 0.0–0.2)

## 2023-04-12 LAB — RESP PANEL BY RT-PCR (RSV, FLU A&B, COVID)  RVPGX2
Influenza A by PCR: NEGATIVE
Influenza B by PCR: NEGATIVE
Resp Syncytial Virus by PCR: NEGATIVE
SARS Coronavirus 2 by RT PCR: NEGATIVE

## 2023-04-12 LAB — TROPONIN I (HIGH SENSITIVITY)
Troponin I (High Sensitivity): 9 ng/L (ref <18)
Troponin I (High Sensitivity): 10 ng/L (ref <18)

## 2023-04-12 LAB — MAGNESIUM: Magnesium: 2.1 mg/dL (ref 1.7–2.4)

## 2023-04-12 LAB — CULTURE, BLOOD (ROUTINE X 2): Special Requests: ADEQUATE

## 2023-04-12 LAB — BRAIN NATRIURETIC PEPTIDE: B Natriuretic Peptide: 77.6 pg/mL (ref 0.0–100.0)

## 2023-04-12 LAB — LACTIC ACID, PLASMA: Lactic Acid, Venous: 1.1 mmol/L (ref 0.5–1.9)

## 2023-04-12 MED ORDER — SODIUM CHLORIDE 0.9 % IV SOLN
500.0000 mg | Freq: Once | INTRAVENOUS | Status: AC
  Administered 2023-04-12: 500 mg via INTRAVENOUS
  Filled 2023-04-12: qty 5

## 2023-04-12 MED ORDER — ASPIRIN 81 MG PO TBEC
81.0000 mg | DELAYED_RELEASE_TABLET | Freq: Every day | ORAL | Status: DC
  Administered 2023-04-13 – 2023-04-15 (×3): 81 mg via ORAL
  Filled 2023-04-12 (×3): qty 1

## 2023-04-12 MED ORDER — MAGNESIUM OXIDE -MG SUPPLEMENT 400 (240 MG) MG PO TABS
400.0000 mg | ORAL_TABLET | Freq: Every morning | ORAL | Status: DC
  Administered 2023-04-13 – 2023-04-15 (×3): 400 mg via ORAL
  Filled 2023-04-12 (×3): qty 1

## 2023-04-12 MED ORDER — ARIPIPRAZOLE 10 MG PO TABS
5.0000 mg | ORAL_TABLET | Freq: Every day | ORAL | Status: DC
  Administered 2023-04-12 – 2023-04-14 (×3): 5 mg via ORAL
  Filled 2023-04-12 (×3): qty 1

## 2023-04-12 MED ORDER — POTASSIUM CHLORIDE CRYS ER 20 MEQ PO TBCR
40.0000 meq | EXTENDED_RELEASE_TABLET | Freq: Once | ORAL | Status: AC
  Administered 2023-04-12: 40 meq via ORAL
  Filled 2023-04-12: qty 2

## 2023-04-12 MED ORDER — ONDANSETRON HCL 4 MG/2ML IJ SOLN: 4.0000 mg | Freq: Four times a day (QID) | INTRAMUSCULAR | Status: DC | PRN

## 2023-04-12 MED ORDER — ESCITALOPRAM OXALATE 10 MG PO TABS
10.0000 mg | ORAL_TABLET | Freq: Every day | ORAL | Status: DC
  Administered 2023-04-13 – 2023-04-15 (×3): 10 mg via ORAL
  Filled 2023-04-12 (×3): qty 1

## 2023-04-12 MED ORDER — SODIUM CHLORIDE 0.9 % IV SOLN
1.0000 g | Freq: Once | INTRAVENOUS | Status: AC
  Administered 2023-04-12: 1 g via INTRAVENOUS
  Filled 2023-04-12: qty 10

## 2023-04-12 MED ORDER — ALBUTEROL SULFATE (2.5 MG/3ML) 0.083% IN NEBU: 2.5000 mg | INHALATION_SOLUTION | RESPIRATORY_TRACT | Status: DC | PRN

## 2023-04-12 MED ORDER — IOHEXOL 350 MG/ML SOLN
100.0000 mL | Freq: Once | INTRAVENOUS | Status: AC | PRN
  Administered 2023-04-12: 80 mL via INTRAVENOUS

## 2023-04-12 MED ORDER — TRAZODONE HCL 50 MG PO TABS: 25.0000 mg | ORAL_TABLET | Freq: Every evening | ORAL | Status: DC | PRN

## 2023-04-12 MED ORDER — ENOXAPARIN SODIUM 30 MG/0.3ML IJ SOSY
30.0000 mg | PREFILLED_SYRINGE | INTRAMUSCULAR | Status: DC
  Administered 2023-04-12 – 2023-04-14 (×3): 30 mg via SUBCUTANEOUS
  Filled 2023-04-12 (×3): qty 0.3

## 2023-04-12 MED ORDER — ACETAMINOPHEN 325 MG PO TABS: 650.0000 mg | ORAL_TABLET | Freq: Four times a day (QID) | ORAL | Status: DC | PRN

## 2023-04-12 MED ORDER — SODIUM CHLORIDE 0.9 % IV SOLN
500.0000 mg | INTRAVENOUS | Status: DC
  Administered 2023-04-13 – 2023-04-15 (×3): 500 mg via INTRAVENOUS
  Filled 2023-04-12 (×3): qty 5

## 2023-04-12 MED ORDER — AMLODIPINE BESYLATE 10 MG PO TABS
10.0000 mg | ORAL_TABLET | Freq: Every day | ORAL | Status: DC
  Administered 2023-04-12 – 2023-04-14 (×3): 10 mg via ORAL
  Filled 2023-04-12 (×3): qty 1

## 2023-04-12 MED ORDER — RISPERIDONE 0.25 MG PO TABS
0.2500 mg | ORAL_TABLET | Freq: Every day | ORAL | Status: DC
  Filled 2023-04-12 (×2): qty 1

## 2023-04-12 MED ORDER — SODIUM CHLORIDE 0.9 % IV SOLN
1.0000 g | INTRAVENOUS | Status: DC
  Administered 2023-04-13 – 2023-04-15 (×3): 1 g via INTRAVENOUS
  Filled 2023-04-12 (×3): qty 10

## 2023-04-12 MED ORDER — ACETAMINOPHEN 650 MG RE SUPP: 650.0000 mg | Freq: Four times a day (QID) | RECTAL | Status: DC | PRN

## 2023-04-12 MED ORDER — LISINOPRIL 10 MG PO TABS
10.0000 mg | ORAL_TABLET | Freq: Every day | ORAL | Status: DC
  Administered 2023-04-13 – 2023-04-15 (×3): 10 mg via ORAL
  Filled 2023-04-12 (×3): qty 1

## 2023-04-12 MED ORDER — ONDANSETRON HCL 4 MG PO TABS: 4.0000 mg | ORAL_TABLET | Freq: Four times a day (QID) | ORAL | Status: DC | PRN

## 2023-04-12 MED ORDER — METOPROLOL TARTRATE 5 MG/5ML IV SOLN: 5.0000 mg | Freq: Four times a day (QID) | INTRAVENOUS | Status: DC | PRN

## 2023-04-12 NOTE — ED Triage Notes (Signed)
Pt arrives today with daughter. Pt has had cold since Monday. Pt has had a cough x 2 days. Pt has been more fatigued. Pt is usually able to walk with assistance.  Pt satting 81% on arrival. Placed on 4L [place] by [facility] RT.

## 2023-04-12 NOTE — H&P (Signed)
History and Physical  Mckenzie [name] ZOX:[id number] DOB: 1946/06/03 DOA: 04/12/2023  PCP: Sonny Masters, FNP   Chief Complaint: Cough, fatigue  HPI: Mckenzie [name] is a 77 y.o. female with medical history significant for pretension, hypertension lipidemia, prior CVA, COVID January 2023 presents with 2 days of cough and lethargy found to have new hypoxic respiratory failure and community-acquired pneumonia.  History is provided mainly by the patient's husband who was at the bedside, states that she has been having a cough, feeling short of breath for the last 2 to 3 days.  Denies any fever, chest pain, nausea, vomiting, diarrhea or other concerns.  ED Course: She was brought to the ER by her daughter, saturating 81% on room air upon arrival, placed on 4 L nasal cannula oxygen.  Lab work unremarkable other than potassium 3.0.  CT of the chest was done consistent with multifocal pneumonia.  She was given empiric IV azithromycin and Rocephin, and admitted to the hospitalist service at [facility] long.  Review of Systems: Please see HPI for pertinent positives and negatives. A complete 10 system review of systems are otherwise negative.  Past Medical History:  Diagnosis Date   Anemia    low iron   Arthritis    Depression    Diabetes (HCC) 11/30/2021   High cholesterol    Hypertension    Retinal micro-aneurysm of right eye    Stroke (HCC)    2 or 3 mini strokes   Past Surgical History:  Procedure Laterality Date   ARTERY BIOPSY Bilateral 12/15/2020   Procedure: BILATERAL TEMPORAL ARTERY BIOPSIES;  Surgeon: Chuck Hint, MD;  Location: [facility] OR;  Service: Vascular;  Laterality: Bilateral;   COLONOSCOPY WITH PROPOFOL N/A 12/09/2020   Procedure: COLONOSCOPY WITH PROPOFOL;  Surgeon: Iva Boop, MD;  Location: [facility] ENDOSCOPY;  Service: Endoscopy;  Laterality: N/A;   ESOPHAGOGASTRODUODENOSCOPY (EGD) WITH PROPOFOL N/A 12/09/2020   Procedure: ESOPHAGOGASTRODUODENOSCOPY (EGD) WITH  PROPOFOL;  Surgeon: Iva Boop, MD;  Location: [facility] ENDOSCOPY;  Service: Endoscopy;  Laterality: N/A;   EYE SURGERY     FINGER SURGERY Left    RING FINGER   IR THORACENTESIS ASP PLEURAL SPACE W/IMG GUIDE  12/13/2020   OPEN REDUCTION INTERNAL FIXATION (ORIF) DISTAL RADIAL FRACTURE Left 10/05/2020   Procedure: OPEN REDUCTION INTERNAL FIXATION (ORIF) DISTAL RADIAL FRACTURE;  Surgeon: Roby Lofts, MD;  Location: [facility] OR;  Service: Orthopedics;  Laterality: Left;  supraclavicular block   POLYPECTOMY  12/09/2020   Procedure: POLYPECTOMY;  Surgeon: Iva Boop, MD;  Location: [facility] ENDOSCOPY;  Service: Endoscopy;;   SPINAL FUSION  08/10/2021   T10-L2   TUBAL LIGATION      Social History:  reports that she quit smoking about 42 years ago. Her smoking use included cigarettes. She has a 0.10 pack-year smoking history. She has never used smokeless tobacco. She reports that she does not drink alcohol and does not use drugs.   Allergies  Allergen Reactions   Aldomet [Methyldopa] Other (See Comments)    Flu-like symptoms   Fosamax [Alendronate Sodium] Other (See Comments)    Weakness/fatigue    Plavix [Clopidogrel] Other (See Comments)    Arm swelled, bleeding   Acetazolamide Rash   Buspar [Buspirone] Palpitations    Family History  Problem Relation Age of Onset   Hyperlipidemia Mother    Hypertension Mother    Diabetes Mother    COPD Father    Depression Sister    Suicidality Brother    Drug  abuse Brother    Healthy Daughter    Healthy Daughter    Healthy Daughter    Colon cancer Neg Hx    Celiac disease Neg Hx    Inflammatory bowel disease Neg Hx      Prior to Admission medications   Medication Sig Start Date End Date Taking? Authorizing Provider  amLODipine (NORVASC) 10 MG tablet Take 1 tablet (10 mg total) by mouth at bedtime. 12/10/22   Sonny Masters, FNP  ARIPiprazole (ABILIFY) 5 MG tablet Take by mouth. 03/11/22   [provider]  aspirin EC 81 MG tablet Take 81  mg by mouth daily. Swallow whole.    [provider]  Bromfenac Sodium 0.07 % SOLN Place 1 drop into the right eye every evening.    [provider]  Calcium Carb-Cholecalciferol (CALCIUM+D3 PO) Take 2 tablets by mouth in the morning.    [provider]  COMBIGAN 0.2-0.5 % ophthalmic solution Place 1 drop into both eyes every 12 (twelve) hours. 12/01/13   [provider]  dorzolamide (TRUSOPT) 2 % ophthalmic solution Place 1 drop into the left eye 2 (two) times daily.    [provider]  Ergocalciferol (VITAMIN D2) 50 MCG (2000 UT) TABS Take 1 tablet by mouth daily. 04/30/21   Sater, Pearletha Furl, MD  escitalopram (LEXAPRO) 10 MG tablet Take 1 tablet (10 mg total) by mouth daily after breakfast. 01/31/22   Sarina Ill, DO  fluorometholone (FML) 0.1 % ophthalmic suspension Place 1 drop into the right eye 2 (two) times daily.    [provider]  latanoprost (XALATAN) 0.005 % ophthalmic solution Place 1 drop into both eyes at bedtime. 12/29/15   [provider]  lisinopril (ZESTRIL) 10 MG tablet Take 1 tablet (10 mg total) by mouth daily. 12/10/22   Sonny Masters, FNP  magnesium oxide (MAG-OX) 400 MG tablet Take 400 mg by mouth in the morning.    [provider]  Multiple Vitamin (MULTIVITAMIN WITH MINERALS) TABS tablet Take 1 tablet by mouth in the morning. One A Day Multivitamin    [provider]  risperiDONE (RISPERDAL) 0.25 MG tablet Take 0.25 mg by mouth 2 (two) times daily. 05/16/22   [provider]    Physical Exam: BP (!) 141/65 (BP Location: Right Arm)   Pulse 76   Temp 98.4 F (36.9 C) (Oral)   Resp 18   Ht 5' (1.524 m)   Wt 44.9 kg   SpO2 98%   BMI 19.33 kg/m   General: Thin, frail-appearing, alert and oriented.  Wearing 4 L nasal cannula oxygen.  Breath sounds are distant but Eyes: EOMI, clear conjuctivae, white sclerea Neck: supple, no masses, trachea mildline  Cardiovascular: RRR,  no murmurs or rubs, no peripheral edema  Respiratory: Distant breath sounds, but clear.  No wheezing or rhonchi Abdomen: soft, nontender, nondistended, normal bowel tones heard  Skin: dry, no rashes  Musculoskeletal: no joint effusions, normal range of motion  Psychiatric: appropriate affect, normal speech  Neurologic: extraocular muscles intact, clear speech, moving all extremities with intact sensorium          Labs on Admission:  Basic Metabolic Panel: Recent Labs  Lab 04/12/23 0834  NA 136  K 3.0*  CL 97*  CO2 31  GLUCOSE 139*  BUN 13  CREATININE 0.64  CALCIUM 9.8   Liver Function Tests: Recent Labs  Lab 04/12/23 0834  AST 20  ALT 11  ALKPHOS 63  BILITOT 0.4  PROT 7.0  ALBUMIN 3.8   No results for input(s): "LIPASE", "AMYLASE" in the last 168 hours. No results for input(s): "AMMONIA" in the last 168 hours. CBC: Recent Labs  Lab 04/12/23 0834  WBC 9.2  NEUTROABS 8.1*  HGB 12.7  HCT 38.0  MCV 87.0  PLT 186   Cardiac Enzymes: No results for input(s): "CKTOTAL", "CKMB", "CKMBINDEX", "TROPONINI" in the last 168 hours.  BNP (last 3 results) Recent Labs    04/12/23 0832  BNP 77.6    ProBNP (last 3 results) No results for input(s): "PROBNP" in the last 8760 hours.  CBG: No results for input(s): "GLUCAP" in the last 168 hours.  Radiological Exams on Admission: CT Angio Chest PE W and/or Wo Contrast  Result Date: 04/12/2023 CLINICAL DATA:  Clinical concern for pulmonary embolism. EXAM: CT ANGIOGRAPHY CHEST WITH CONTRAST TECHNIQUE: Multidetector CT imaging of the chest was performed using the standard protocol during bolus administration of intravenous contrast. Multiplanar CT image reconstructions and MIPs were obtained to evaluate the vascular anatomy. RADIATION DOSE REDUCTION: This exam was performed according to the departmental dose-optimization program which includes automated exposure control, adjustment of the mA and/or kV according to patient size  and/or use of iterative reconstruction technique. CONTRAST:  80mL OMNIPAQUE IOHEXOL 350 MG/ML SOLN COMPARISON:  12/11/2020 FINDINGS: Cardiovascular: The heart size is upper normal to borderline enlarged. No substantial pericardial effusion. Mild atherosclerotic calcification is noted in the wall of the thoracic aorta. There is no filling defect within the opacified pulmonary arteries to suggest the presence of an acute pulmonary embolus. Mediastinum/Nodes: 17 mm short axis right hilar node is associated with additional lymphoid tissue in both hilar regions. No mediastinal lymphadenopathy. The esophagus has normal imaging features. There is no axillary lymphadenopathy. Lungs/Pleura: Patchy consolidative airspace disease is seen in both lower lobes dependently with associated areas of airway impaction. No pleural effusion. Upper Abdomen: Unremarkable. Musculoskeletal: No worrisome lytic or sclerotic osseous abnormality. Compression fractures at T4 and T6 are new in the interval. Probable mild compression deformity at T7, T8, and T9. Status post vertebral augmentation at T10 and T11 with thoracolumbar fusion hardware spanning a collapsed vertebral body at T12. Healed fracture of the manubrium. Review of the MIP images confirms the above findings. IMPRESSION: 1. No CT evidence for acute pulmonary embolus. 2. Patchy consolidative airspace disease in both lower lobes dependently with associated areas of airway impaction. Imaging features are compatible with multifocal pneumonia. 3. Enlarged right hilar node with additional lymphoid tissue in both hilar regions. This is likely reactive. Consider follow-up CT in 3 months to ensure resolution. 4. Numerous thoracic compression fractures. 5.  Aortic Atherosclerosis (ICD10-I70.0). Electronically Signed   By: Kennith Center M.D.   On: 04/12/2023 10:40   DG Chest Portable 1 View  Result Date: 04/12/2023 CLINICAL DATA:  Hypoxia. EXAM: PORTABLE CHEST 1 VIEW COMPARISON:   10/28/2021 FINDINGS: Stable cardiomediastinal contours. Chronic coarsened interstitial markings with diffuse bronchial wall thickening. Low lung volumes with asymmetric elevation of right hemidiaphragm. Left retrocardiac opacity identified which appears new from the prior exam. Right lung appears clear. Postoperative changes noted within the lumbar spine. IMPRESSION: New left retrocardiac opacity may reflect atelectasis or infiltrate. Electronically Signed   By: Signa Kell M.D.   On: 04/12/2023 09:41    Assessment/Plan 77 year old female with a history of hypertension, hyperlipidemia, CVA nonambulatory, depression being admitted to the hospital with acute hypoxic respiratory failure due to multifocal community-acquired pneumonia.  Multifocal community-acquired pneumonia-with cough, hypoxia, consolidations seen on CT  of the chest -Inpatient admission -Empiric IV azithromycin and Rocephin -Incentive spirometry -Flutter valve  Acute hypoxic respiratory failure-due to community-acquired pneumonia, treating as above -Supplemental oxygen to keep O2 saturation greater than 90%, wean as tolerated  Hypokalemia -Replete orally with 40 mEq potassium chloride now -Check magnesium level -Recheck potassium level in the morning  Hypertension-continue home Norvasc and lisinopril  History of retinal microaneurysms-continue eyedrops once reconciled by pharmacy staff  DVT prophylaxis: Lovenox     Code Status: Full Code, confirmed with husband at the time of admission  Consults called: None  Admission status: The appropriate patient status for this patient is INPATIENT. Inpatient status is judged to be reasonable and necessary in order to provide the required intensity of service to ensure the patient's safety. The patient's presenting symptoms, physical exam findings, and initial radiographic and laboratory data in the context of their chronic comorbidities is felt to place them at high risk for  further clinical deterioration. Furthermore, it is not anticipated that the patient will be medically stable for discharge from the hospital within 2 midnights of admission.    I certify that at the point of admission it is my clinical judgment that the patient will require inpatient hospital care spanning beyond 2 midnights from the point of admission due to high intensity of service, high risk for further deterioration and high frequency of surveillance required  Time spent: 56 minutes  Deyton Ellenbecker Sharlette Dense MD [facility] Pager [phone]  If 7PM-7AM, please contact night-coverage [email or web address] Password [facility]  04/12/2023, 12:44 PM

## 2023-04-12 NOTE — ED Notes (Signed)
[facility] at bedside 

## 2023-04-12 NOTE — ED Provider Notes (Signed)
[facility] AT [facility] Provider Note   CSN: [id number] Arrival date & time: 04/12/23  0755     History  Chief Complaint  Patient presents with   Cough    Mckenzie [name] is a 77 y.o. female.  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  Has had cough, congestion since Monday. Took cough medicine Monday, since later Monday?TUesday she has not been herself with progressive worsening generalized weakness. She typically is wheelchair bound but helps with transitions, now family having to essentially lift her from place to place.  Today so fatigued she could not even lift her head up.  Has seemed to have difficulty swallowing, faigue. Temperature 100.5 max.  Cough and not able to raise it. Congestion. No vomiting, diarrhea,. Some chest pain with coughing.  Has not really had dyspnea, just severe fatigue.  Past Medical History:  Diagnosis Date   Anemia    low iron   Arthritis    Depression    Diabetes (HCC) 11/30/2021   High cholesterol    Hypertension    Retinal micro-aneurysm of right eye    Stroke (HCC)    2 or 3 mini strokes     Home Medications Prior to Admission medications   Medication Sig Start Date End Date Taking? Authorizing Provider  amLODipine (NORVASC) 10 MG tablet Take 1 tablet (10 mg total) by mouth at bedtime. 12/10/22  Yes Rakes, Doralee Albino, FNP  ARIPiprazole (ABILIFY) 5 MG tablet Take 5 mg by mouth every evening. 03/11/22  Yes [provider]  aspirin EC 81 MG tablet Take 81 mg by mouth daily. Swallow whole.   Yes [provider]  Calcium Carb-Cholecalciferol (CALCIUM+D3 PO) Take 2 tablets by mouth in the morning.   Yes [provider]  COMBIGAN 0.2-0.5 %  ophthalmic solution Place 1 drop into both eyes every 12 (twelve) hours. 12/01/13  Yes [provider]  dorzolamide (TRUSOPT) 2 % ophthalmic solution Place 1 drop into the left eye 2 (two) times daily.   Yes [provider]  Ergocalciferol (VITAMIN D2) 50 MCG (2000 UT) TABS Take 1 tablet by mouth daily. 04/30/21  Yes Sater, Pearletha Furl, MD  escitalopram (LEXAPRO) 10 MG tablet Take 1 tablet (10 mg total) by mouth daily after breakfast. 01/31/22  Yes Sarina Ill, DO  ketorolac (ACULAR) 0.4 % SOLN Place 1 drop into the right eye 3 (three) times daily. 03/24/23  Yes [provider]  latanoprost (XALATAN) 0.005 % ophthalmic solution Place 1 drop into both eyes at bedtime. 12/29/15  Yes [provider]  lisinopril (ZESTRIL) 10 MG tablet Take 1 tablet (10 mg total) by mouth daily. 12/10/22  Yes Rakes, Doralee Albino, FNP  magnesium oxide (MAG-OX) 400 MG tablet Take 400 mg by mouth in the morning.   Yes [provider]  Multiple Vitamin (MULTIVITAMIN WITH MINERALS) TABS tablet Take 1 tablet by mouth in the morning. One A Day Multivitamin   Yes [provider]      Allergies    Aldomet [methyldopa], Fosamax [alendronate sodium], Plavix [clopidogrel], Acetazolamide, and Buspar [buspirone]    Review of Systems   Review of Systems  Physical Exam Updated Vital Signs BP (!) 143/63 (BP Location: Right Arm)   Pulse  77   Temp 98.9 F (37.2 C) (Oral)   Resp 16   Ht 5' (1.524 m)   Wt 44.9 kg   SpO2 94%   BMI 19.33 kg/m  Physical Exam Vitals and nursing note reviewed.  Constitutional:      General: She is in acute distress.     Appearance: She is well-developed. She is ill-appearing and toxic-appearing. She is not diaphoretic.  HENT:     Head: Normocephalic and atraumatic.  Eyes:     Conjunctiva/sclera: Conjunctivae normal.  Cardiovascular:     Rate and Rhythm: Normal rate and regular rhythm.     Heart sounds: Normal heart sounds. No murmur  heard.    No friction rub. No gallop.  Pulmonary:     Effort: Pulmonary effort is normal. No respiratory distress.     Breath sounds: Rhonchi present. No wheezing or rales.     Comments: kyphosis Abdominal:     General: There is no distension.     Palpations: Abdomen is soft.     Tenderness: There is no abdominal tenderness. There is no guarding.  Musculoskeletal:        General: No tenderness.     Cervical back: Normal range of motion.  Skin:    General: Skin is warm and dry.     Findings: No erythema or rash.  Neurological:     Mental Status: She is alert and oriented to person, place, and time.     ED Results / Procedures / Treatments   Labs (all labs ordered are listed, but only abnormal results are displayed) Labs Reviewed  CBC WITH DIFFERENTIAL/PLATELET - Abnormal; Notable for the following components:      Result Value   Neutro Abs 8.1 (*)    All other components within normal limits  COMPREHENSIVE METABOLIC PANEL - Abnormal; Notable for the following components:   Potassium 3.0 (*)    Chloride 97 (*)    Glucose, Bld 139 (*)    All other components within normal limits  CULTURE, BLOOD (ROUTINE X 2)  CULTURE, BLOOD (ROUTINE X 2)  RESP PANEL BY RT-PCR (RSV, FLU A&B, COVID)  RVPGX2  LACTIC ACID, PLASMA  BRAIN NATRIURETIC PEPTIDE  MAGNESIUM  CBC  BASIC METABOLIC PANEL  TROPONIN I (HIGH SENSITIVITY)  TROPONIN I (HIGH SENSITIVITY)    EKG EKG Interpretation Date/Time:  Saturday April 12 2023 08:20:57 EDT Ventricular Rate:  85 PR Interval:  180 QRS Duration:  94 QT Interval:  386 QTC Calculation: 459 R Axis:   6  Text Interpretation: Sinus rhythm Abnormal R-wave progression, early transition LVH by voltage Borderline T abnormalities, anterior leads Confirmed by Alvira Monday ([id number]) on 04/12/2023 10:22:22 PM  Radiology CT Angio Chest PE W and/or Wo Contrast  Result Date: 04/12/2023 CLINICAL DATA:  Clinical concern for pulmonary embolism. EXAM: CT ANGIOGRAPHY  CHEST WITH CONTRAST TECHNIQUE: Multidetector CT imaging of the chest was performed using the standard protocol during bolus administration of intravenous contrast. Multiplanar CT image reconstructions and MIPs were obtained to evaluate the vascular anatomy. RADIATION DOSE REDUCTION: This exam was performed according to the departmental dose-optimization program which includes automated exposure control, adjustment of the mA and/or kV according to patient size and/or use of iterative reconstruction technique. CONTRAST:  80mL OMNIPAQUE IOHEXOL 350 MG/ML SOLN COMPARISON:  12/11/2020 FINDINGS: Cardiovascular: The heart size is upper normal to borderline enlarged. No substantial pericardial effusion. Mild atherosclerotic calcification is noted in the wall of the thoracic aorta. There is no filling defect within the  opacified pulmonary arteries to suggest the presence of an acute pulmonary embolus. Mediastinum/Nodes: 17 mm short axis right hilar node is associated with additional lymphoid tissue in both hilar regions. No mediastinal lymphadenopathy. The esophagus has normal imaging features. There is no axillary lymphadenopathy. Lungs/Pleura: Patchy consolidative airspace disease is seen in both lower lobes dependently with associated areas of airway impaction. No pleural effusion. Upper Abdomen: Unremarkable. Musculoskeletal: No worrisome lytic or sclerotic osseous abnormality. Compression fractures at T4 and T6 are new in the interval. Probable mild compression deformity at T7, T8, and T9. Status post vertebral augmentation at T10 and T11 with thoracolumbar fusion hardware spanning a collapsed vertebral body at T12. Healed fracture of the manubrium. Review of the MIP images confirms the above findings. IMPRESSION: 1. No CT evidence for acute pulmonary embolus. 2. Patchy consolidative airspace disease in both lower lobes dependently with associated areas of airway impaction. Imaging features are compatible with  multifocal pneumonia. 3. Enlarged right hilar node with additional lymphoid tissue in both hilar regions. This is likely reactive. Consider follow-up CT in 3 months to ensure resolution. 4. Numerous thoracic compression fractures. 5.  Aortic Atherosclerosis (ICD10-I70.0). Electronically Signed   By: Kennith Center M.D.   On: 04/12/2023 10:40   DG Chest Portable 1 View  Result Date: 04/12/2023 CLINICAL DATA:  Hypoxia. EXAM: PORTABLE CHEST 1 VIEW COMPARISON:  10/28/2021 FINDINGS: Stable cardiomediastinal contours. Chronic coarsened interstitial markings with diffuse bronchial wall thickening. Low lung volumes with asymmetric elevation of right hemidiaphragm. Left retrocardiac opacity identified which appears new from the prior exam. Right lung appears clear. Postoperative changes noted within the lumbar spine. IMPRESSION: New left retrocardiac opacity may reflect atelectasis or infiltrate. Electronically Signed   By: Signa Kell M.D.   On: 04/12/2023 09:41    Procedures Procedures    Medications Ordered in ED Medications  aspirin EC tablet 81 mg (81 mg Oral Not Given 04/12/23 1543)  amLODipine (NORVASC) tablet 10 mg (10 mg Oral Given 04/12/23 2026)  lisinopril (ZESTRIL) tablet 10 mg (10 mg Oral Not Given 04/12/23 1544)  ARIPiprazole (ABILIFY) tablet 5 mg (5 mg Oral Given 04/12/23 2026)  escitalopram (LEXAPRO) tablet 10 mg (has no administration in time range)  risperiDONE (RISPERDAL) tablet 0.25 mg (0.25 mg Oral Not Given 04/12/23 2134)  magnesium oxide (MAG-OX) tablet 400 mg (has no administration in time range)  enoxaparin (LOVENOX) injection 30 mg (30 mg Subcutaneous Given 04/12/23 2027)  acetaminophen (TYLENOL) tablet 650 mg (has no administration in time range)    Or  acetaminophen (TYLENOL) suppository 650 mg (has no administration in time range)  traZODone (DESYREL) tablet 25 mg (has no administration in time range)  ondansetron (ZOFRAN) tablet 4 mg (has no administration in time range)    Or   ondansetron (ZOFRAN) injection 4 mg (has no administration in time range)  albuterol (PROVENTIL) (2.5 MG/3ML) 0.083% nebulizer solution 2.5 mg (has no administration in time range)  metoprolol tartrate (LOPRESSOR) injection 5 mg (has no administration in time range)  azithromycin (ZITHROMAX) 500 mg in sodium chloride 0.9 % 250 mL IVPB (has no administration in time range)  cefTRIAXone (ROCEPHIN) 1 g in sodium chloride 0.9 % 100 mL IVPB (has no administration in time range)  iohexol (OMNIPAQUE) 350 MG/ML injection 100 mL (80 mLs Intravenous Contrast Given 04/12/23 0957)  cefTRIAXone (ROCEPHIN) 1 g in sodium chloride 0.9 % 100 mL IVPB (0 g Intravenous Stopped 04/12/23 1115)  azithromycin (ZITHROMAX) 500 mg in sodium chloride 0.9 % 250 mL IVPB (500  mg Intravenous New Bag/Given 04/12/23 1056)  potassium chloride SA (KLOR-CON M) CR tablet 40 mEq (40 mEq Oral Given 04/12/23 1607)    ED Course/ Medical Decision Making/ A&P                               77 year old female with a history of hypertension, hyperlipidemia, diabetes, CVA, 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  Differential diagnosis includes ACS, PE, COPD exacerbation, CHF exacerbation, anemia, pneumonia, viral etiology such as COVID 19 infection, metabolic abnormality.    Chest x-ray was done which showed left retrocardiac opacity atelectasis vs pneumonia.  Ordered rocephin and azithromycin.  EKG was evaluated by me which showed no significant acute changes.  BNP was normal, no other signs of CHF. Troponin negative, doubt ACS.  COVID testing negative.  CT PE study done given degree of hypoxia without significant changes on XR and was personally evaluated and interpreted by me show no PE, patchy consolidative airspace disease,  numerous thoracic compression fractures.   Admitted  to hospitalist service for continued care of pneumonia and hypoxic respiratory failure.          Final Clinical Impression(s) / ED Diagnoses Final diagnoses:  Acute hypoxic respiratory failure (HCC)  Pneumonia due to infectious organism, unspecified laterality, unspecified part of lung    Rx / DC Orders ED Discharge Orders     None         Alvira Monday, MD 04/12/23 2226

## 2023-04-12 NOTE — ED Notes (Signed)
Attempt to call report  Floor RN unavailable and will call back when ready

## 2023-04-13 DIAGNOSIS — J189 Pneumonia, unspecified organism: Secondary | ICD-10-CM | POA: Diagnosis not present

## 2023-04-13 LAB — BASIC METABOLIC PANEL
Anion gap: 8 (ref 5–15)
BUN: 16 mg/dL (ref 8–23)
CO2: 28 mmol/L (ref 22–32)
Calcium: 8.8 mg/dL — ABNORMAL LOW (ref 8.9–10.3)
Chloride: 99 mmol/L (ref 98–111)
Creatinine, Ser: 0.58 mg/dL (ref 0.44–1.00)
GFR, Estimated: >60 mL/min (ref >60)
Glucose, Bld: 102 mg/dL — ABNORMAL HIGH (ref 70–99)
Potassium: 3.8 mmol/L (ref 3.5–5.1)
Sodium: 135 mmol/L (ref 135–145)

## 2023-04-13 LAB — CBC
HCT: 35.6 % — ABNORMAL LOW (ref 36.0–46.0)
Hemoglobin: 11.5 g/dL — ABNORMAL LOW (ref 12.0–15.0)
MCH: 28.9 pg (ref 26.0–34.0)
MCHC: 32.3 g/dL (ref 30.0–36.0)
MCV: 89.4 fL (ref 80.0–100.0)
Platelets: 168 10*3/uL (ref 150–400)
RBC: 3.98 MIL/uL (ref 3.87–5.11)
RDW: 13.3 % (ref 11.5–15.5)
WBC: 5.8 10*3/uL (ref 4.0–10.5)
nRBC: 0 % (ref 0.0–0.2)

## 2023-04-13 LAB — EXPECTORATED SPUTUM ASSESSMENT W GRAM STAIN, RFLX TO RESP C

## 2023-04-13 LAB — CULTURE, RESPIRATORY W GRAM STAIN

## 2023-04-13 LAB — CULTURE, BLOOD (ROUTINE X 2): Special Requests: ADEQUATE

## 2023-04-13 MED ORDER — KETOROLAC TROMETHAMINE 0.5 % OP SOLN
1.0000 [drp] | Freq: Three times a day (TID) | OPHTHALMIC | Status: DC
  Administered 2023-04-13 – 2023-04-14 (×4): 1 [drp] via OPHTHALMIC
  Filled 2023-04-13: qty 5

## 2023-04-13 MED ORDER — DORZOLAMIDE HCL 2 % OP SOLN
1.0000 [drp] | Freq: Two times a day (BID) | OPHTHALMIC | Status: DC
  Administered 2023-04-13 – 2023-04-14 (×3): 1 [drp] via OPHTHALMIC
  Filled 2023-04-13: qty 10

## 2023-04-13 MED ORDER — BRIMONIDINE TARTRATE-TIMOLOL 0.2-0.5 % OP SOLN: 1.0000 [drp] | Freq: Two times a day (BID) | OPHTHALMIC | Status: DC

## 2023-04-13 MED ORDER — TIMOLOL MALEATE 0.5 % OP SOLN
1.0000 [drp] | Freq: Two times a day (BID) | OPHTHALMIC | Status: DC
  Administered 2023-04-13 – 2023-04-14 (×3): 1 [drp] via OPHTHALMIC
  Filled 2023-04-13: qty 5

## 2023-04-13 MED ORDER — LATANOPROST 0.005 % OP SOLN
1.0000 [drp] | Freq: Every day | OPHTHALMIC | Status: DC
  Administered 2023-04-13: 1 [drp] via OPHTHALMIC
  Filled 2023-04-13: qty 2.5

## 2023-04-13 MED ORDER — BRIMONIDINE TARTRATE 0.2 % OP SOLN
1.0000 [drp] | Freq: Two times a day (BID) | OPHTHALMIC | Status: DC
  Administered 2023-04-13 – 2023-04-14 (×3): 1 [drp] via OPHTHALMIC
  Filled 2023-04-13: qty 5

## 2023-04-13 NOTE — Progress Notes (Signed)
Progress Note   Patient: Mckenzie [name] ZOX:[id number] DOB: 1946-04-18 DOA: 04/12/2023     1 DOS: the patient was seen and examined on 04/13/2023   Brief hospital course: Mckenzie [name] is a 77 y.o. female with medical history significant for pretension, hypertension lipidemia, prior CVA, COVID January 2023 presents with 2 days of cough and lethargy found to have new hypoxic respiratory failure and community-acquired pneumonia.  History is provided mainly by the patient's husband who was at the bedside, states that she has been having a cough, feeling short of breath for the last 2 to 3 days.  Denies any fever, chest pain, nausea, vomiting, diarrhea or other concerns.   Assessment and Plan: * PNA (pneumonia) Check sputum Check urinary pathogens Negative for COVID, RSV, and flu On Rocephin and azithromycin Imaging suggests bilateral lower lobe infiltrates.  Question aspiration though the family gives no history of this.   Type 2 diabetes mellitus with hyperglycemia, without long-term current use of insulin (HCC) On no meds Low-carb diet  History of CVA (cerebrovascular accident) On aspirin Patient can stand and transfer and walk with support but mostly uses a wheelchair  Adjustment disorder with mixed anxiety and depressed mood Continue Abilify and Lexapro  Secondary glaucoma Continue Combigan, Trusopt, Acular, Xalatan  Hyperlipidemia associated with type 2 diabetes mellitus (HCC) Low-cholesterol diet  Benign essential hypertension Continue amlodipine and lisinopril   Subjective: Patient reports feeling some better today.  She continues to have oxygen requirement  Physical Exam: Vitals:   04/12/23 1615 04/12/23 2024 04/13/23 0018 04/13/23 0548  BP: (!) 120/54 (!) 143/63 (!) 136/58 (!) 143/60  Pulse: 74 77 76 75  Resp: 18 16 14 16   Temp: 98.4 F (36.9 C) 98.9 F (37.2 C) 97.6 F (36.4 C) 98.7 F (37.1 C)  TempSrc: Oral Oral Oral Oral  SpO2: 93% 94% 96% 96%   Weight:      Height:       Physical Examination: General appearance - chronically ill appearing Chest -Rales in the lower lobes Heart - normal rate and regular rhythm Abdomen - soft, nontender, nondistended, no masses or organomegaly  Data Reviewed: Results for orders placed or performed during the hospital encounter of 04/12/23 (from the past 24 hour(s))  Magnesium     Status: None   Collection Time: 04/12/23  1:04 PM  Result Value Ref Range   Magnesium 2.1 1.7 - 2.4 mg/dL  CBC     Status: Abnormal   Collection Time: 04/13/23  7:22 AM  Result Value Ref Range   WBC 5.8 4.0 - 10.5 K/uL   RBC 3.98 3.87 - 5.11 MIL/uL   Hemoglobin 11.5 (L) 12.0 - 15.0 g/dL   HCT 81.1 (L) 91.4 - 78.2 %   MCV 89.4 80.0 - 100.0 fL   MCH 28.9 26.0 - 34.0 pg   MCHC 32.3 30.0 - 36.0 g/dL   RDW 95.6 21.3 - 08.6 %   Platelets 168 150 - 400 K/uL   nRBC 0.0 0.0 - 0.2 %  Basic metabolic panel     Status: Abnormal   Collection Time: 04/13/23  7:22 AM  Result Value Ref Range   Sodium 135 135 - 145 mmol/L   Potassium 3.8 3.5 - 5.1 mmol/L   Chloride 99 98 - 111 mmol/L   CO2 28 22 - 32 mmol/L   Glucose, Bld 102 (H) 70 - 99 mg/dL   BUN 16 8 - 23 mg/dL   Creatinine, Ser 5.78 0.44 - 1.00 mg/dL  Calcium 8.8 (L) 8.9 - 10.3 mg/dL   GFR, Estimated >16 >10 mL/min   Anion gap 8 5 - 15    CT Angio Chest PE W and/or Wo Contrast  Result Date: 04/12/2023 CLINICAL DATA:  Clinical concern for pulmonary embolism. EXAM: CT ANGIOGRAPHY CHEST WITH CONTRAST TECHNIQUE: Multidetector CT imaging of the chest was performed using the standard protocol during bolus administration of intravenous contrast. Multiplanar CT image reconstructions and MIPs were obtained to evaluate the vascular anatomy. RADIATION DOSE REDUCTION: This exam was performed according to the departmental dose-optimization program which includes automated exposure control, adjustment of the mA and/or kV according to patient size and/or use of iterative  reconstruction technique. CONTRAST:  80mL OMNIPAQUE IOHEXOL 350 MG/ML SOLN COMPARISON:  12/11/2020 FINDINGS: Cardiovascular: The heart size is upper normal to borderline enlarged. No substantial pericardial effusion. Mild atherosclerotic calcification is noted in the wall of the thoracic aorta. There is no filling defect within the opacified pulmonary arteries to suggest the presence of an acute pulmonary embolus. Mediastinum/Nodes: 17 mm short axis right hilar node is associated with additional lymphoid tissue in both hilar regions. No mediastinal lymphadenopathy. The esophagus has normal imaging features. There is no axillary lymphadenopathy. Lungs/Pleura: Patchy consolidative airspace disease is seen in both lower lobes dependently with associated areas of airway impaction. No pleural effusion. Upper Abdomen: Unremarkable. Musculoskeletal: No worrisome lytic or sclerotic osseous abnormality. Compression fractures at T4 and T6 are new in the interval. Probable mild compression deformity at T7, T8, and T9. Status post vertebral augmentation at T10 and T11 with thoracolumbar fusion hardware spanning a collapsed vertebral body at T12. Healed fracture of the manubrium. Review of the MIP images confirms the above findings. IMPRESSION: 1. No CT evidence for acute pulmonary embolus. 2. Patchy consolidative airspace disease in both lower lobes dependently with associated areas of airway impaction. Imaging features are compatible with multifocal pneumonia. 3. Enlarged right hilar node with additional lymphoid tissue in both hilar regions. This is likely reactive. Consider follow-up CT in 3 months to ensure resolution. 4. Numerous thoracic compression fractures. 5.  Aortic Atherosclerosis (ICD10-I70.0). Electronically Signed   By: Kennith Center M.D.   On: 04/12/2023 10:40   DG Chest Portable 1 View  Result Date: 04/12/2023 CLINICAL DATA:  Hypoxia. EXAM: PORTABLE CHEST 1 VIEW COMPARISON:  10/28/2021 FINDINGS: Stable  cardiomediastinal contours. Chronic coarsened interstitial markings with diffuse bronchial wall thickening. Low lung volumes with asymmetric elevation of right hemidiaphragm. Left retrocardiac opacity identified which appears new from the prior exam. Right lung appears clear. Postoperative changes noted within the lumbar spine. IMPRESSION: New left retrocardiac opacity may reflect atelectasis or infiltrate. Electronically Signed   By: Signa Kell M.D.   On: 04/12/2023 09:41    Family Communication: Husband and daughter at bedside  Disposition: Status is: Inpatient Remains inpatient appropriate because: Need for IV antibiotics, oxygen requirement  Planned Discharge Destination: Home DVT prophylaxis: Lovenox Time spent: 41 minutes  Author: Reva Bores, MD 04/13/2023 11:25 AM  For on call review [email or web address].

## 2023-04-13 NOTE — Assessment & Plan Note (Signed)
Continue Abilify and Lexapro

## 2023-04-13 NOTE — Assessment & Plan Note (Signed)
Check sputum Check urinary pathogens Negative for COVID, RSV, and flu On Rocephin and azithromycin Imaging suggests bilateral lower lobe infiltrates.  Question aspiration though the family gives no history of this.

## 2023-04-13 NOTE — Assessment & Plan Note (Signed)
Low cholesterol diet

## 2023-04-13 NOTE — Assessment & Plan Note (Signed)
On aspirin Patient can stand and transfer and walk with support but mostly uses a wheelchair

## 2023-04-13 NOTE — Assessment & Plan Note (Signed)
Continue Combigan, Trusopt, Acular, Xalatan

## 2023-04-13 NOTE — Assessment & Plan Note (Signed)
-  Continue amlodipine and lisinopril 

## 2023-04-13 NOTE — Hospital Course (Signed)
Mckenzie [name] is a 77 y.o. female with medical history significant for pretension, hypertension lipidemia, prior CVA, COVID January 2023 presents with 2 days of cough and lethargy found to have new hypoxic respiratory failure and community-acquired pneumonia.  History is provided mainly by the patient's husband who was at the bedside, states that she has been having a cough, feeling short of breath for the last 2 to 3 days.  Denies any fever, chest pain, nausea, vomiting, diarrhea or other concerns.

## 2023-04-13 NOTE — Assessment & Plan Note (Signed)
On no meds Low-carb diet

## 2023-04-14 DIAGNOSIS — J189 Pneumonia, unspecified organism: Secondary | ICD-10-CM | POA: Diagnosis not present

## 2023-04-14 LAB — CBC
HCT: 32.3 % — ABNORMAL LOW (ref 36.0–46.0)
Hemoglobin: 10.3 g/dL — ABNORMAL LOW (ref 12.0–15.0)
MCH: 28.9 pg (ref 26.0–34.0)
MCHC: 31.9 g/dL (ref 30.0–36.0)
MCV: 90.5 fL (ref 80.0–100.0)
Platelets: 175 10*3/uL (ref 150–400)
RBC: 3.57 MIL/uL — ABNORMAL LOW (ref 3.87–5.11)
RDW: 13.2 % (ref 11.5–15.5)
WBC: 4.5 10*3/uL (ref 4.0–10.5)
nRBC: 0 % (ref 0.0–0.2)

## 2023-04-14 LAB — CULTURE, BLOOD (ROUTINE X 2): Culture: NO GROWTH

## 2023-04-14 LAB — STREP PNEUMONIAE URINARY ANTIGEN: Strep Pneumo Urinary Antigen: NEGATIVE

## 2023-04-14 LAB — BASIC METABOLIC PANEL
Anion gap: 7 (ref 5–15)
BUN: 15 mg/dL (ref 8–23)
CO2: 26 mmol/L (ref 22–32)
Calcium: 8.4 mg/dL — ABNORMAL LOW (ref 8.9–10.3)
Chloride: 105 mmol/L (ref 98–111)
Creatinine, Ser: 0.45 mg/dL (ref 0.44–1.00)
GFR, Estimated: >60 mL/min (ref >60)
Glucose, Bld: 90 mg/dL (ref 70–99)
Potassium: 3.1 mmol/L — ABNORMAL LOW (ref 3.5–5.1)
Sodium: 138 mmol/L (ref 135–145)

## 2023-04-14 LAB — CULTURE, RESPIRATORY W GRAM STAIN

## 2023-04-14 LAB — LEGIONELLA PNEUMOPHILA SEROGP 1 UR AG: L. pneumophila Serogp 1 Ur Ag: NEGATIVE

## 2023-04-14 NOTE — Progress Notes (Signed)
[facility] Progress Note  Patient: Mckenzie [name]     ZOX:[id number]  DOA: 04/12/2023   PCP: Sonny Masters, FNP       Brief hospital course: This is a 77 year old female with hypertension, history of CVA who presents to the hospital for cough and increasing fatigue.  Apparently she was having symptoms 2 to 3 days prior to presenting to the hospital. In the ED: Pulse ox 81% on room air. CT of the chest revealed patchy consolidative airspace disease in both lower lobes with associated areas of airway impaction, enlarged right hilar node with additional lymphoid tissue in both hilar regions. Subjective:  She has a continuous and persistent cough.  She has been ambulated down the hallway on my request and feels shortness of breath and weakness.  She is unhappy that her pure wick was removed and states "I am not able to walk" although her granddaughter who was in the room does state that the patient is able to walk independently.  Assessment and Plan: Principal Problem:   PNA (pneumonia) acute hypoxic respiratory failure -She is being treated with ceftriaxone and azithromycin along with nebulizer treatments -Pulse ox noted to be 88% today when walking-she continues to have a persistent cough and dyspnea - Continue current treatment plan  Active Problems:   Benign essential hypertension -Lisinopril and amlodipine resumed  Glaucoma     Code Status: Full Code Consultants: None Level of Care: Level of care: Med-Surg Total time on patient care: 40 minutes DVT prophylaxis: enoxaparin (LOVENOX) injection 30 mg Start: 04/12/23 2200 SCDs Start: 04/12/23 1243      Objective:   Vitals:   04/13/23 0548 04/13/23 1327 04/13/23 2039 04/14/23 0529  BP: (!) 143/60 (!) 144/59 (!) 136/59 134/64  Pulse: 75 77 72 69  Resp: 16 18 16 16   Temp: 98.7 F (37.1 C) 98.3 F (36.8 C) 99 F (37.2 C) 98.2 F (36.8 C)  TempSrc: Oral  Oral Oral  SpO2: 96% 92% 95% 94%  Weight:      Height:        Filed Weights   04/12/23 0815  Weight: 44.9 kg   Exam: General exam: Appears comfortable, severely kyphotic  HEENT: oral mucosa moist Respiratory system: poor breath sounds- frequent congested cough Cardiovascular system: S1 & S2 heard  Gastrointestinal system: Abdomen soft, non-tender, nondistended. Normal bowel sounds   Extremities: No cyanosis, clubbing or edema Psychiatry:  Mood & affect appropriate.      CBC: Recent Labs  Lab 04/12/23 0834 04/13/23 0722 04/14/23 0451  WBC 9.2 5.8 4.5  NEUTROABS 8.1*  --   --   HGB 12.7 11.5* 10.3*  HCT 38.0 35.6* 32.3*  MCV 87.0 89.4 90.5  PLT 186 168 175   Basic Metabolic Panel: Recent Labs  Lab 04/12/23 0834 04/12/23 1304 04/13/23 0722 04/14/23 0451  NA 136  --  135 138  K 3.0*  --  3.8 3.1*  CL 97*  --  99 105  CO2 31  --  28 26  GLUCOSE 139*  --  102* 90  BUN 13  --  16 15  CREATININE 0.64  --  0.58 0.45  CALCIUM 9.8  --  8.8* 8.4*  MG  --  2.1  --   --    GFR: Estimated Creatinine Clearance: 41.7 mL/min (by C-G formula based on SCr of 0.45 mg/dL).  Scheduled Meds:  amLODipine  10 mg Oral QHS   ARIPiprazole  5 mg Oral QHS   aspirin  EC  81 mg Oral Daily   brimonidine  1 drop Both Eyes BID   And   timolol  1 drop Both Eyes BID   dorzolamide  1 drop Left Eye BID   enoxaparin (LOVENOX) injection  30 mg Subcutaneous Q24H   escitalopram  10 mg Oral Daily   ketorolac  1 drop Right Eye TID   latanoprost  1 drop Both Eyes QHS   lisinopril  10 mg Oral Daily   magnesium oxide  400 mg Oral q AM   risperiDONE  0.25 mg Oral QHS   Continuous Infusions:  azithromycin Stopped (04/13/23 1119)   cefTRIAXone (ROCEPHIN)  IV Stopped (04/13/23 1157)   Imaging and lab data was personally reviewed CT Angio Chest PE W and/or Wo Contrast  Result Date: 04/12/2023 CLINICAL DATA:  Clinical concern for pulmonary embolism. EXAM: CT ANGIOGRAPHY CHEST WITH CONTRAST TECHNIQUE: Multidetector CT imaging of the chest was performed  using the standard protocol during bolus administration of intravenous contrast. Multiplanar CT image reconstructions and MIPs were obtained to evaluate the vascular anatomy. RADIATION DOSE REDUCTION: This exam was performed according to the departmental dose-optimization program which includes automated exposure control, adjustment of the mA and/or kV according to patient size and/or use of iterative reconstruction technique. CONTRAST:  80mL OMNIPAQUE IOHEXOL 350 MG/ML SOLN COMPARISON:  12/11/2020 FINDINGS: Cardiovascular: The heart size is upper normal to borderline enlarged. No substantial pericardial effusion. Mild atherosclerotic calcification is noted in the wall of the thoracic aorta. There is no filling defect within the opacified pulmonary arteries to suggest the presence of an acute pulmonary embolus. Mediastinum/Nodes: 17 mm short axis right hilar node is associated with additional lymphoid tissue in both hilar regions. No mediastinal lymphadenopathy. The esophagus has normal imaging features. There is no axillary lymphadenopathy. Lungs/Pleura: Patchy consolidative airspace disease is seen in both lower lobes dependently with associated areas of airway impaction. No pleural effusion. Upper Abdomen: Unremarkable. Musculoskeletal: No worrisome lytic or sclerotic osseous abnormality. Compression fractures at T4 and T6 are new in the interval. Probable mild compression deformity at T7, T8, and T9. Status post vertebral augmentation at T10 and T11 with thoracolumbar fusion hardware spanning a collapsed vertebral body at T12. Healed fracture of the manubrium. Review of the MIP images confirms the above findings. IMPRESSION: 1. No CT evidence for acute pulmonary embolus. 2. Patchy consolidative airspace disease in both lower lobes dependently with associated areas of airway impaction. Imaging features are compatible with multifocal pneumonia. 3. Enlarged right hilar node with additional lymphoid tissue in both  hilar regions. This is likely reactive. Consider follow-up CT in 3 months to ensure resolution. 4. Numerous thoracic compression fractures. 5.  Aortic Atherosclerosis (ICD10-I70.0). Electronically Signed   By: Kennith Center M.D.   On: 04/12/2023 10:40   DG Chest Portable 1 View  Result Date: 04/12/2023 CLINICAL DATA:  Hypoxia. EXAM: PORTABLE CHEST 1 VIEW COMPARISON:  10/28/2021 FINDINGS: Stable cardiomediastinal contours. Chronic coarsened interstitial markings with diffuse bronchial wall thickening. Low lung volumes with asymmetric elevation of right hemidiaphragm. Left retrocardiac opacity identified which appears new from the prior exam. Right lung appears clear. Postoperative changes noted within the lumbar spine. IMPRESSION: New left retrocardiac opacity may reflect atelectasis or infiltrate. Electronically Signed   By: Signa Kell M.D.   On: 04/12/2023 09:41    LOS: 2 days   Author: Calvert Cantor  04/14/2023 8:35 AM  To contact [facility]>   Check the care team in [facility] and look for the attending/consulting [facility]  provider listed  Log into [email or web address] and use [place]'s universal password   Go to> "[facility]"  and find provider  If you still have difficulty reaching the provider, please page the [facility] (Director on Call) for the Hospitalists listed on amion

## 2023-04-14 NOTE — Progress Notes (Signed)
SATURATION QUALIFICATIONS: (This note is used to comply with regulatory documentation for home oxygen)  Patient Saturations on Room Air at Rest = 91%  Patient Saturations on Room Air while Ambulating = 88%  Patient Saturations on 0 Liters of oxygen while Ambulating = 88%  Please briefly explain why patient needs home oxygen:

## 2023-04-15 DIAGNOSIS — J189 Pneumonia, unspecified organism: Secondary | ICD-10-CM | POA: Diagnosis not present

## 2023-04-15 LAB — CBC
HCT: 33 % — ABNORMAL LOW (ref 36.0–46.0)
Hemoglobin: 10.7 g/dL — ABNORMAL LOW (ref 12.0–15.0)
MCH: 28.6 pg (ref 26.0–34.0)
MCHC: 32.4 g/dL (ref 30.0–36.0)
MCV: 88.2 fL (ref 80.0–100.0)
Platelets: 223 10*3/uL (ref 150–400)
RBC: 3.74 MIL/uL — ABNORMAL LOW (ref 3.87–5.11)
RDW: 12.9 % (ref 11.5–15.5)
WBC: 4.5 10*3/uL (ref 4.0–10.5)
nRBC: 0 % (ref 0.0–0.2)

## 2023-04-15 LAB — BASIC METABOLIC PANEL
Anion gap: 7 (ref 5–15)
BUN: 17 mg/dL (ref 8–23)
CO2: 26 mmol/L (ref 22–32)
Calcium: 8.6 mg/dL — ABNORMAL LOW (ref 8.9–10.3)
Chloride: 105 mmol/L (ref 98–111)
Creatinine, Ser: 0.45 mg/dL (ref 0.44–1.00)
GFR, Estimated: >60 mL/min (ref >60)
Glucose, Bld: 98 mg/dL (ref 70–99)
Potassium: 3 mmol/L — ABNORMAL LOW (ref 3.5–5.1)
Sodium: 138 mmol/L (ref 135–145)

## 2023-04-15 MED ORDER — GUAIFENESIN-DM 100-10 MG/5ML PO SYRP: 5.0000 mL | ORAL_SOLUTION | ORAL | 0 refills | Status: DC | PRN

## 2023-04-15 MED ORDER — RISPERIDONE 0.25 MG PO TABS: 0.2500 mg | ORAL_TABLET | Freq: Two times a day (BID) | ORAL | Status: DC

## 2023-04-15 MED ORDER — AZITHROMYCIN 500 MG PO TABS: 500.0000 mg | ORAL_TABLET | Freq: Every day | ORAL | 0 refills | Status: DC

## 2023-04-15 MED ORDER — CEFDINIR 300 MG PO CAPS: 300.0000 mg | ORAL_CAPSULE | Freq: Two times a day (BID) | ORAL | 0 refills | Status: AC

## 2023-04-15 NOTE — Progress Notes (Signed)
Patient walking test completed, 94% on RA, patient placed on 4L while walking and was 98% after the walk, MD made aware

## 2023-04-15 NOTE — Progress Notes (Signed)
Iv removed, discharge info reviewed with patient and family at bedside

## 2023-04-15 NOTE — Discharge Summary (Signed)
Physician Discharge Summary  Mckenzie [name]:[id number] DOB: 1946/01/18 DOA: 04/12/2023  PCP: Mckenzie Masters, FNP  Admit date: 04/12/2023 Discharge date: 04/15/2023   Discharge Diagnoses:   Principal Problem:   PNA (pneumonia) Active Problems:   Benign essential hypertension   Hyperlipidemia associated with type 2 diabetes mellitus (HCC)   Secondary glaucoma   History of CVA (cerebrovascular accident)   Type 2 diabetes mellitus with hyperglycemia, without long-term current use of insulin (HCC)     Brief hospital course: This is a 77 year old female with hypertension, history of CVA who presents to the hospital for cough and increasing fatigue.  Apparently she was having symptoms 2 to 3 days prior to presenting to the hospital. In the ED: Pulse ox 81% on room air. CT of the chest revealed patchy consolidative airspace disease in both lower lobes with associated areas of airway impaction, enlarged right hilar node with additional lymphoid tissue in both hilar regions.   Assessment and Plan: Principal Problem:   PNA (pneumonia) acute hypoxic respiratory failure -She is being treated with ceftriaxone and azithromycin along with nebulizer treatments - Hospital day 2> Pulse ox noted to be 88% when walking - Hospital day 3> she continues to have a persistent cough but overall has improved today and is no longer hypoxic  Active Problems:   Benign essential hypertension -Lisinopril and amlodipine resumed   Glaucoma          Discharge Instructions  Discharge Instructions     Diet - low sodium heart healthy   Complete by: As directed    Increase activity slowly   Complete by: As directed       Allergies as of 04/15/2023       Reactions   Aldomet [methyldopa] Other (See Comments)   Flu-like symptoms   Fosamax [alendronate Sodium] Other (See Comments)   Weakness/fatigue    Plavix [clopidogrel] Other (See Comments)   Arm swelled, bleeding   Acetazolamide Rash   Buspar  [buspirone] Palpitations        Medication List     TAKE these medications    amLODipine 10 MG tablet Commonly known as: NORVASC Take 1 tablet (10 mg total) by mouth at bedtime.   ARIPiprazole 5 MG tablet Commonly known as: ABILIFY Take 5 mg by mouth every evening.   aspirin EC 81 MG tablet Take 81 mg by mouth daily. Swallow whole.   azithromycin 500 MG tablet Commonly known as: Zithromax Take 1 tablet (500 mg total) by mouth daily for 3 days. Start taking on: April 16, 2023   CALCIUM+D3 PO Take 2 tablets by mouth in the morning.   cefdinir 300 MG capsule Commonly known as: OMNICEF Take 1 capsule (300 mg total) by mouth 2 (two) times daily for 6 days.   Combigan 0.2-0.5 % ophthalmic solution Generic drug: brimonidine-timolol Place 1 drop into both eyes every 12 (twelve) hours.   dorzolamide 2 % ophthalmic solution Commonly known as: TRUSOPT Place 1 drop into the left eye 2 (two) times daily.   escitalopram 10 MG tablet Commonly known as: LEXAPRO Take 1 tablet (10 mg total) by mouth daily after breakfast.   guaiFENesin-dextromethorphan 100-10 MG/5ML syrup Commonly known as: ROBITUSSIN DM Take 5 mLs by mouth every 4 (four) hours as needed for cough.   ketorolac 0.4 % Soln Commonly known as: ACULAR Place 1 drop into the right eye 3 (three) times daily.   latanoprost 0.005 % ophthalmic solution Commonly known as: XALATAN Place 1 drop into both  eyes at bedtime.   lisinopril 10 MG tablet Commonly known as: ZESTRIL Take 1 tablet (10 mg total) by mouth daily.   magnesium oxide 400 MG tablet Commonly known as: MAG-OX Take 400 mg by mouth in the morning.   multivitamin with minerals Tabs tablet Take 1 tablet by mouth in the morning. One A Day Multivitamin   risperiDONE 0.25 MG tablet Commonly known as: RISPERDAL Take 1 tablet (0.25 mg total) by mouth 2 (two) times daily.   Vitamin D2 50 MCG (2000 UT) Tabs Take 1 tablet by mouth daily.             The results of significant diagnostics from this hospitalization (including imaging, microbiology, ancillary and laboratory) are listed below for reference.    CT Angio Chest PE W and/or Wo Contrast  Result Date: 04/12/2023 CLINICAL DATA:  Clinical concern for pulmonary embolism. EXAM: CT ANGIOGRAPHY CHEST WITH CONTRAST TECHNIQUE: Multidetector CT imaging of the chest was performed using the standard protocol during bolus administration of intravenous contrast. Multiplanar CT image reconstructions and MIPs were obtained to evaluate the vascular anatomy. RADIATION DOSE REDUCTION: This exam was performed according to the departmental dose-optimization program which includes automated exposure control, adjustment of the mA and/or kV according to patient size and/or use of iterative reconstruction technique. CONTRAST:  80mL OMNIPAQUE IOHEXOL 350 MG/ML SOLN COMPARISON:  12/11/2020 FINDINGS: Cardiovascular: The heart size is upper normal to borderline enlarged. No substantial pericardial effusion. Mild atherosclerotic calcification is noted in the wall of the thoracic aorta. There is no filling defect within the opacified pulmonary arteries to suggest the presence of an acute pulmonary embolus. Mediastinum/Nodes: 17 mm short axis right hilar node is associated with additional lymphoid tissue in both hilar regions. No mediastinal lymphadenopathy. The esophagus has normal imaging features. There is no axillary lymphadenopathy. Lungs/Pleura: Patchy consolidative airspace disease is seen in both lower lobes dependently with associated areas of airway impaction. No pleural effusion. Upper Abdomen: Unremarkable. Musculoskeletal: No worrisome lytic or sclerotic osseous abnormality. Compression fractures at T4 and T6 are new in the interval. Probable mild compression deformity at T7, T8, and T9. Status post vertebral augmentation at T10 and T11 with thoracolumbar fusion hardware spanning a collapsed vertebral body at T12.  Healed fracture of the manubrium. Review of the MIP images confirms the above findings. IMPRESSION: 1. No CT evidence for acute pulmonary embolus. 2. Patchy consolidative airspace disease in both lower lobes dependently with associated areas of airway impaction. Imaging features are compatible with multifocal pneumonia. 3. Enlarged right hilar node with additional lymphoid tissue in both hilar regions. This is likely reactive. Consider follow-up CT in 3 months to ensure resolution. 4. Numerous thoracic compression fractures. 5.  Aortic Atherosclerosis (ICD10-I70.0). Electronically Signed   By: Kennith Center M.D.   On: 04/12/2023 10:40   DG Chest Portable 1 View  Result Date: 04/12/2023 CLINICAL DATA:  Hypoxia. EXAM: PORTABLE CHEST 1 VIEW COMPARISON:  10/28/2021 FINDINGS: Stable cardiomediastinal contours. Chronic coarsened interstitial markings with diffuse bronchial wall thickening. Low lung volumes with asymmetric elevation of right hemidiaphragm. Left retrocardiac opacity identified which appears new from the prior exam. Right lung appears clear. Postoperative changes noted within the lumbar spine. IMPRESSION: New left retrocardiac opacity may reflect atelectasis or infiltrate. Electronically Signed   By: Signa Kell M.D.   On: 04/12/2023 09:41   Labs:   Basic Metabolic Panel: Recent Labs  Lab 04/12/23 0834 04/12/23 1304 04/13/23 0722 04/14/23 0451 04/15/23 0632  NA 136  --  135  138 138  K 3.0*  --  3.8 3.1* 3.0*  CL 97*  --  99 105 105  CO2 31  --  28 26 26   GLUCOSE 139*  --  102* 90 98  BUN 13  --  16 15 17   CREATININE 0.64  --  0.58 0.45 0.45  CALCIUM 9.8  --  8.8* 8.4* 8.6*  MG  --  2.1  --   --   --      CBC: Recent Labs  Lab 04/12/23 0834 04/13/23 0722 04/14/23 0451 04/15/23 0632  WBC 9.2 5.8 4.5 4.5  NEUTROABS 8.1*  --   --   --   HGB 12.7 11.5* 10.3* 10.7*  HCT 38.0 35.6* 32.3* 33.0*  MCV 87.0 89.4 90.5 88.2  PLT 186 168 175 223         SIGNED:   Calvert Cantor, MD  [facility] 04/15/2023, 10:43 AM

## 2023-04-16 ENCOUNTER — Telehealth: Payer: Self-pay

## 2023-04-16 LAB — CULTURE, BLOOD (ROUTINE X 2): Culture: NO GROWTH

## 2023-04-16 NOTE — Transitions of Care (Post Inpatient/ED Visit) (Unsigned)
   04/16/2023  Name: Mckenzie [name] MRN: [id number] DOB: 1946/07/15  Today's TOC FU Call Status: Today's TOC FU Call Status:: Unsuccessul Call (1st Attempt) Unsuccessful Call (1st Attempt) Date: 04/16/23  Attempted to reach the patient regarding the most recent Inpatient/ED visit.  Follow Up Plan: Additional outreach attempts will be made to reach the patient to complete the Transitions of Care (Post Inpatient/ED visit) call.   Signature Karena Addison, LPN [facility] Nurse Health Advisor Direct Dial [phone]

## 2023-04-17 NOTE — Transitions of Care (Post Inpatient/ED Visit) (Signed)
04/17/2023  Name: Mckenzie [name] MRN: [id number] DOB: 12-Apr-1946  Today's TOC FU Call Status: Today's TOC FU Call Status:: Successful TOC FU Call Competed Unsuccessful Call (1st Attempt) Date: 04/16/23 [facility] FU Call Complete Date: 04/17/23  Transition Care Management Follow-up Telephone Call Date of Discharge: 04/15/23 Discharge Facility: [facility]) Type of Discharge: Inpatient Admission Primary Inpatient Discharge Diagnosis:: respiratory failure How have you been since you were released from the hospital?: Better Any questions or concerns?: No  Items Reviewed: Did you receive and understand the discharge instructions provided?: Yes Medications obtained,verified, and reconciled?: Yes (Medications Reviewed) Any new allergies since your discharge?: No Dietary orders reviewed?: Yes Do you have support at home?: Yes People in Home: spouse  Medications Reviewed Today: Medications Reviewed Today     Reviewed by Karena Addison, LPN (Licensed Practical Nurse) on 04/17/23 at 1522  Med List Status: <None>   Medication Order Taking? Sig Documenting Provider Last Dose Status Informant  amLODipine (NORVASC) 10 MG tablet [id number] No Take 1 tablet (10 mg total) by mouth at bedtime. Sonny Masters, FNP 04/11/2023 Active Child  ARIPiprazole (ABILIFY) 5 MG tablet [id number] No Take 5 mg by mouth every evening. [provider] 04/11/2023 Active Child  aspirin EC 81 MG tablet [id number] No Take 81 mg by mouth daily. Swallow whole. [provider] 04/12/2023 Active Child  azithromycin (ZITHROMAX) 500 MG tablet [id number]  Take 1 tablet (500 mg total) by mouth daily for 3 days. Calvert Cantor, MD  Active   Calcium Carb-Cholecalciferol (CALCIUM+D3 PO) [id number] No Take 2 tablets by mouth in the morning. [provider] 04/12/2023 Active Child  cefdinir (OMNICEF) 300 MG capsule [id number]  Take 1 capsule (300 mg total) by mouth 2 (two) times daily for 6 days. Calvert Cantor, MD   Active   COMBIGAN 0.2-0.5 % ophthalmic solution [id number] No Place 1 drop into both eyes every 12 (twelve) hours. [provider] 04/12/2023 Active Child           Med Note Carlos Levering, MINDY S   Wed Dec 06, 2020 11:20 AM)    dorzolamide (TRUSOPT) 2 % ophthalmic solution [id number] No Place 1 drop into the left eye 2 (two) times daily. [provider] 04/12/2023 Active Child           Med Note Carlos Levering, MINDY S   Wed Dec 06, 2020 11:20 AM)    Ergocalciferol (VITAMIN D2) 50 MCG (2000 UT) TABS [id number] No Take 1 tablet by mouth daily. Asa Lente, MD 04/12/2023 Active Child  escitalopram (LEXAPRO) 10 MG tablet [id number] No Take 1 tablet (10 mg total) by mouth daily after breakfast. Sarina Ill, DO 04/12/2023 Active Child  guaiFENesin-dextromethorphan (ROBITUSSIN DM) 100-10 MG/5ML syrup [id number]  Take 5 mLs by mouth every 4 (four) hours as needed for cough. Calvert Cantor, MD  Active   ketorolac (ACULAR) 0.4 % SOLN [id number] No Place 1 drop into the right eye 3 (three) times daily. [provider] 04/12/2023 Active Child  latanoprost (XALATAN) 0.005 % ophthalmic solution [id number] No Place 1 drop into both eyes at bedtime. [provider] 04/11/2023 Active Child           Med Note Antony Madura, Arn Medal   Thu Oct 05, 2020 10:33 AM)    lisinopril (ZESTRIL) 10 MG tablet [id number] No Take 1 tablet (10 mg total) by mouth daily. Sonny Masters, FNP 04/12/2023 Active Child  magnesium oxide (MAG-OX) 400 MG tablet [id number] No Take 400 mg by mouth in  the morning. [provider] 04/12/2023 Active Child  Multiple Vitamin (MULTIVITAMIN WITH MINERALS) TABS tablet [id number] No Take 1 tablet by mouth in the morning. One A Day Multivitamin [provider] 04/12/2023 Active Child  risperiDONE (RISPERDAL) 0.25 MG tablet [id number]  Take 1 tablet (0.25 mg total) by mouth 2 (two) times daily. Calvert Cantor, MD  Active             Home Care and Equipment/Supplies: Were  Home Health Services Ordered?: NA Any new equipment or medical supplies ordered?: NA  Functional Questionnaire: Do you need assistance with bathing/showering or dressing?: No Do you need assistance with meal preparation?: No Do you need assistance with eating?: No Do you have difficulty maintaining continence: No Do you need assistance with getting out of bed/getting out of a chair/moving?: No Do you have difficulty managing or taking your medications?: No  Follow up appointments reviewed: PCP Follow-up appointment confirmed?: Yes Date of PCP follow-up appointment?: 04/18/23 Follow-up Provider: [facility] Follow-up appointment confirmed?: NA Do you need transportation to your follow-up appointment?: No Do you understand care options if your condition(s) worsen?: Yes-patient verbalized understanding    SIGNATURE Karena Addison, LPN [facility] Nurse Health Advisor Direct Dial [phone]

## 2023-04-18 ENCOUNTER — Encounter: Payer: Self-pay | Admitting: Family Medicine

## 2023-04-18 ENCOUNTER — Ambulatory Visit (INDEPENDENT_AMBULATORY_CARE_PROVIDER_SITE_OTHER): Payer: Medicare Other | Admitting: Family Medicine

## 2023-04-18 VITALS — BP 129/71 | HR 73 | Temp 98.7°F | Ht 60.0 in | Wt 98.0 lb

## 2023-04-18 DIAGNOSIS — J189 Pneumonia, unspecified organism: Secondary | ICD-10-CM

## 2023-04-18 DIAGNOSIS — J9601 Acute respiratory failure with hypoxia: Secondary | ICD-10-CM | POA: Diagnosis not present

## 2023-04-18 DIAGNOSIS — Z09 Encounter for follow-up examination after completed treatment for conditions other than malignant neoplasm: Secondary | ICD-10-CM | POA: Diagnosis not present

## 2023-04-18 LAB — CBC WITH DIFFERENTIAL/PLATELET
Basophils Absolute: 0 10*3/uL (ref 0.0–0.2)
Basos: 0 %
EOS (ABSOLUTE): 0.2 10*3/uL (ref 0.0–0.4)
Eos: 2 %
Hematocrit: 35.2 % (ref 34.0–46.6)
Hemoglobin: 11.3 g/dL (ref 11.1–15.9)
Immature Grans (Abs): 0 10*3/uL (ref 0.0–0.1)
Immature Granulocytes: 1 %
Lymphocytes Absolute: 1.4 10*3/uL (ref 0.7–3.1)
Lymphs: 17 %
MCH: 28.3 pg (ref 26.6–33.0)
MCHC: 32.1 g/dL (ref 31.5–35.7)
MCV: 88 fL (ref 79–97)
Monocytes Absolute: 0.5 10*3/uL (ref 0.1–0.9)
Monocytes: 6 %
Neutrophils Absolute: 6.4 10*3/uL (ref 1.4–7.0)
Neutrophils: 74 %
Platelets: 415 10*3/uL (ref 150–450)
RBC: 3.99 x10E6/uL (ref 3.77–5.28)
RDW: 12.9 % (ref 11.7–15.4)
WBC: 8.6 10*3/uL (ref 3.4–10.8)

## 2023-04-18 LAB — BMP8+EGFR
BUN/Creatinine Ratio: 21 (ref 12–28)
BUN: 12 mg/dL (ref 8–27)
CO2: 22 mmol/L (ref 20–29)
Calcium: 9.7 mg/dL (ref 8.7–10.3)
Chloride: 106 mmol/L (ref 96–106)
Creatinine, Ser: 0.56 mg/dL — ABNORMAL LOW (ref 0.57–1.00)
Glucose: 113 mg/dL — ABNORMAL HIGH (ref 70–99)
Potassium: 3.8 mmol/L (ref 3.5–5.2)
Sodium: 144 mmol/L (ref 134–144)
eGFR: 94 mL/min/{1.73_m2} (ref >59)

## 2023-04-18 MED ORDER — ALBUTEROL SULFATE HFA 108 (90 BASE) MCG/ACT IN AERS
2.0000 | INHALATION_SPRAY | Freq: Four times a day (QID) | RESPIRATORY_TRACT | 2 refills | Status: AC | PRN
Start: 2023-04-18 — End: ?

## 2023-04-18 NOTE — Progress Notes (Signed)
Subjective:  [name], female    DOB: 1946-05-07, 77 y.o.   MRN: [id number]  Patient Care Team: Sonny Masters, FNP as PCP - General (Family Medicine) Lanelle Bal, DO as Consulting Physician (Internal Medicine) Benjiman Core, MD (Inactive) as Consulting Physician (Oncology) [facility]   Chief Complaint:  Hospitalization Follow-up (Pneumonia- [facility] /)   HPI: Mckenzie [name] is a 77 y.o. female presenting on 04/18/2023 for Hospitalization Follow-up (Pneumonia- [facility] /)  Pt presents today for hospital discharge follow up. She was taken to the ED 04/12/2023 by her family due to cough, congestion, shortness of breath, and low oxygen saturations. CT chest revealed multifocal pneumonia. She was admitted and treated with oxygen, IV azithromycin and rocephin, and incentive spirometry. She was discharged home 04/15/2023 on oral azithromycin and Omnicef. She has completed the azithromycin and still has a few doses of Omnicef left. She has been doing well since discharge home. Oxygen levels staying above 92% on room air. She is eating and drinking well. Denies fever, chills, weakness, confusion, or diarrhea. No shortness of breath or cough.      Relevant past medical, surgical, family, and social history reviewed and updated as indicated.  Allergies and medications reviewed and updated. Data reviewed: Chart in Epic.   Past Medical History:  Diagnosis Date   Anemia    low iron   Arthritis    Depression    Diabetes (HCC) 11/30/2021   High cholesterol    Hypertension    Retinal micro-aneurysm of right eye    Stroke (HCC)    2 or 3 mini strokes    Past Surgical History:  Procedure Laterality Date   ARTERY BIOPSY Bilateral 12/15/2020   Procedure: BILATERAL TEMPORAL ARTERY BIOPSIES;  Surgeon: Chuck Hint, MD;  Location: [facility] OR;  Service: Vascular;  Laterality: Bilateral;   COLONOSCOPY WITH PROPOFOL N/A 12/09/2020    Procedure: COLONOSCOPY WITH PROPOFOL;  Surgeon: Iva Boop, MD;  Location: [facility] ENDOSCOPY;  Service: Endoscopy;  Laterality: N/A;   ESOPHAGOGASTRODUODENOSCOPY (EGD) WITH PROPOFOL N/A 12/09/2020   Procedure: ESOPHAGOGASTRODUODENOSCOPY (EGD) WITH PROPOFOL;  Surgeon: Iva Boop, MD;  Location: [facility] ENDOSCOPY;  Service: Endoscopy;  Laterality: N/A;   EYE SURGERY     FINGER SURGERY Left    RING FINGER   IR THORACENTESIS ASP PLEURAL SPACE W/IMG GUIDE  12/13/2020   OPEN REDUCTION INTERNAL FIXATION (ORIF) DISTAL RADIAL FRACTURE Left 10/05/2020   Procedure: OPEN REDUCTION INTERNAL FIXATION (ORIF) DISTAL RADIAL FRACTURE;  Surgeon: Roby Lofts, MD;  Location: [facility] OR;  Service: Orthopedics;  Laterality: Left;  supraclavicular block   POLYPECTOMY  12/09/2020   Procedure: POLYPECTOMY;  Surgeon: Iva Boop, MD;  Location: [facility] ENDOSCOPY;  Service: Endoscopy;;   SPINAL FUSION  08/10/2021   T10-L2   TUBAL LIGATION      Social History   Socioeconomic History   Marital status: Married    Spouse name: Not on file   Number of children: 3   Years of education: 12   Highest education level: Not on file  Occupational History   Not on file  Tobacco Use   Smoking status: Former    Current packs/day: 0.00    Average packs/day: 0.1 packs/day for 1 year (0.1 ttl pk-yrs)    Types: Cigarettes    Start date: 43    Quit date: 42    Years since quitting: 42.5   Smokeless tobacco: Never  Tobacco comments:    for a short period  Vaping Use   Vaping status: Never Used  Substance and Sexual Activity   Alcohol use: No   Drug use: No   Sexual activity: Not on file  Other Topics Concern   Not on file  Social History Narrative   Right handed   Caffeine use: coffee daily   Social Determinants of Health   Financial Resource Strain: Not on file  Food Insecurity: No Food Insecurity (04/12/2023)   Hunger Vital Sign    Worried About Running Out of Food in the Last Year: Never true    Ran Out of  Food in the Last Year: Never true  Transportation Needs: No Transportation Needs (04/12/2023)   PRAPARE - Administrator, Civil Service (Medical): No    Lack of Transportation (Non-Medical): No  Physical Activity: Not on file  Stress: Not on file  Social Connections: Not on file  Intimate Partner Violence: Not At Risk (04/12/2023)   Humiliation, Afraid, Rape, and Kick questionnaire    Fear of Current or Ex-Partner: No    Emotionally Abused: No    Physically Abused: No    Sexually Abused: No    Outpatient Encounter Medications as of 04/18/2023  Medication Sig   albuterol (VENTOLIN HFA) 108 (90 Base) MCG/ACT inhaler Inhale 2 puffs into the lungs every 6 (six) hours as needed for wheezing or shortness of breath.   amLODipine (NORVASC) 10 MG tablet Take 1 tablet (10 mg total) by mouth at bedtime.   ARIPiprazole (ABILIFY) 5 MG tablet Take 5 mg by mouth every evening.   aspirin EC 81 MG tablet Take 81 mg by mouth daily. Swallow whole.   Calcium Carb-Cholecalciferol (CALCIUM+D3 PO) Take 2 tablets by mouth in the morning.   cefdinir (OMNICEF) 300 MG capsule Take 1 capsule (300 mg total) by mouth 2 (two) times daily for 6 days.   COMBIGAN 0.2-0.5 % ophthalmic solution Place 1 drop into both eyes every 12 (twelve) hours.   dorzolamide (TRUSOPT) 2 % ophthalmic solution Place 1 drop into the left eye 2 (two) times daily.   Ergocalciferol (VITAMIN D2) 50 MCG (2000 UT) TABS Take 1 tablet by mouth daily.   escitalopram (LEXAPRO) 10 MG tablet Take 1 tablet (10 mg total) by mouth daily after breakfast.   guaiFENesin-dextromethorphan (ROBITUSSIN DM) 100-10 MG/5ML syrup Take 5 mLs by mouth every 4 (four) hours as needed for cough.   ketorolac (ACULAR) 0.4 % SOLN Place 1 drop into the right eye 3 (three) times daily.   latanoprost (XALATAN) 0.005 % ophthalmic solution Place 1 drop into both eyes at bedtime.   lisinopril (ZESTRIL) 10 MG tablet Take 1 tablet (10 mg total) by mouth daily.   magnesium  oxide (MAG-OX) 400 MG tablet Take 400 mg by mouth in the morning.   Multiple Vitamin (MULTIVITAMIN WITH MINERALS) TABS tablet Take 1 tablet by mouth in the morning. One A Day Multivitamin   risperiDONE (RISPERDAL) 0.25 MG tablet Take 1 tablet (0.25 mg total) by mouth 2 (two) times daily.   [DISCONTINUED] azithromycin (ZITHROMAX) 500 MG tablet Take 1 tablet (500 mg total) by mouth daily for 3 days.   No facility-administered encounter medications on file as of 04/18/2023.    Allergies  Allergen Reactions   Aldomet [Methyldopa] Other (See Comments)    Flu-like symptoms   Fosamax [Alendronate Sodium] Other (See Comments)    Weakness/fatigue    Plavix [Clopidogrel] Other (See Comments)    Arm  swelled, bleeding   Acetazolamide Rash   Buspar [Buspirone] Palpitations    Review of Systems  Constitutional:  Negative for activity change, appetite change, chills, diaphoresis, fatigue, fever and unexpected weight change.  Respiratory:  Positive for cough and wheezing. Negative for apnea, choking, shortness of breath and stridor.   Cardiovascular:  Negative for chest pain, palpitations and leg swelling.  Genitourinary:  Negative for decreased urine volume and difficulty urinating.  Neurological:  Negative for dizziness, tremors, seizures, syncope, facial asymmetry, speech difficulty, weakness, light-headedness, numbness and headaches.  All other systems reviewed and are negative.       Objective:  BP 129/71   Pulse 73   Temp 98.7 F (37.1 C) (Temporal)   Ht 5' (1.524 m)   Wt 98 lb (44.5 kg)   SpO2 92%   BMI 19.14 kg/m    Wt Readings from Last 3 Encounters:  04/18/23 98 lb (44.5 kg)  04/12/23 99 lb (44.9 kg)  09/10/22 111 lb (50.3 kg)    Physical Exam Vitals and nursing note reviewed.  Constitutional:      Appearance: Normal appearance. She is normal weight.  HENT:     Head: Normocephalic and atraumatic.     Nose: Nose normal.     Mouth/Throat:     Mouth: Mucous membranes are  moist.     Pharynx: Oropharynx is clear.  Eyes:     Conjunctiva/sclera: Conjunctivae normal.     Pupils: Pupils are equal, round, and reactive to light.  Cardiovascular:     Rate and Rhythm: Normal rate and regular rhythm.     Pulses: Normal pulses.     Heart sounds: Normal heart sounds.  Pulmonary:     Breath sounds: Wheezing (scattered) and rhonchi (RLL, clears with cough) present.  Musculoskeletal:     Right lower leg: No edema.     Left lower leg: No edema.  Skin:    General: Skin is warm and dry.     Capillary Refill: Capillary refill takes less than 2 seconds.  Neurological:     General: No focal deficit present.     Mental Status: She is alert. Mental status is at baseline.     Gait: Gait abnormal (in wheelchair).  Psychiatric:        Mood and Affect: Mood normal.        Behavior: Behavior normal.     Results for orders placed or performed during the hospital encounter of 04/12/23  Blood culture (routine x 2)   Specimen: BLOOD RIGHT ARM  Result Value Ref Range   Specimen Description      BLOOD RIGHT ARM Performed at [facility], [address], [place], [place] [address]    Special Requests      BOTTLES DRAWN AEROBIC AND ANAEROBIC Blood Culture adequate volume Performed at [facility], [address] [place], [place] [address]    Culture      NO GROWTH 5 DAYS Performed at [facility], [address] [place], [place] [address]    Report Status 04/17/2023 FINAL   Blood culture (routine x 2)   Specimen: BLOOD LEFT ARM  Result Value Ref Range   Specimen Description      BLOOD LEFT ARM Performed at [facility], [address], [place], [place] [address]    Special Requests      BOTTLES DRAWN AEROBIC AND ANAEROBIC Blood Culture adequate volume Performed at [facility], [address], [place],  [place] [address]    Culture      NO GROWTH 5 DAYS Performed at [facility], [address] [place], [place] [address]    Report Status 04/17/2023 FINAL   Resp panel by RT-PCR (RSV, Flu A&B, Covid) Anterior Nasal Swab   Specimen: Anterior Nasal Swab  Result Value Ref Range   SARS Coronavirus 2 by RT PCR NEGATIVE NEGATIVE   Influenza A by PCR NEGATIVE NEGATIVE   Influenza B by PCR NEGATIVE NEGATIVE   Resp Syncytial Virus by PCR NEGATIVE NEGATIVE  Expectorated Sputum Assessment w Gram Stain, Rflx to Resp Cult   Specimen: Expectorated Sputum  Result Value Ref Range   Specimen Description EXPECTORATED SPUTUM    Special Requests NONE    Sputum evaluation      THIS SPECIMEN IS ACCEPTABLE FOR SPUTUM CULTURE Performed at [facility], [address] [place], [place] [address]    Report Status 04/13/2023 FINAL   Culture, Respiratory w Gram Stain  Result Value Ref Range   Specimen Description      EXPECTORATED SPUTUM Performed at [facility], [address], [place], [place] [address]    Special Requests      NONE Reflexed from [id number] Performed at [facility], [name] [place], [place] [address]    Gram Stain      RARE WBC SEEN RARE SQUAMOUS EPITHELIAL CELLS PRESENT RARE GRAM POSITIVE COCCI RARE GRAM NEGATIVE RODS    Culture      RARE Normal respiratory flora-no Staph aureus or Pseudomonas seen Performed at [facility], [address], [place], [place] [address]    Report Status 04/16/2023 FINAL   Lactic acid, plasma  Result Value Ref Range   Lactic Acid, Venous 1.1 0.5 - 1.9 mmol/L  CBC with Differential  Result Value Ref Range   WBC 9.2 4.0 - 10.5 K/uL   RBC 4.37 3.87 - 5.11 MIL/uL   Hemoglobin 12.7 12.0 - 15.0 g/dL   HCT 02.7 25.3 - 66.4 %   MCV 87.0 80.0 - 100.0 fL   MCH 29.1 26.0 - 34.0 pg   MCHC 33.4 30.0 - 36.0 g/dL   RDW 40.3 47.4 - 25.9 %   Platelets 186 150 - 400 K/uL   nRBC 0.0 0.0 - 0.2 %   Neutrophils Relative % 89 %   Neutro Abs 8.1 (H) 1.7 - 7.7 K/uL   Lymphocytes Relative  7 %   Lymphs Abs 0.7 0.7 - 4.0 K/uL   Monocytes Relative 4 %   Monocytes Absolute 0.4 0.1 - 1.0 K/uL   Eosinophils Relative 0 %   Eosinophils Absolute 0.0 0.0 - 0.5 K/uL   Basophils Relative 0 %   Basophils Absolute 0.0 0.0 - 0.1 K/uL   Immature Granulocytes 0 %   Abs Immature Granulocytes 0.02 0.00 - 0.07 K/uL  Comprehensive metabolic panel  Result Value Ref Range   Sodium 136 135 - 145 mmol/L   Potassium 3.0 (L) 3.5 - 5.1 mmol/L   Chloride 97 (L) 98 - 111 mmol/L   CO2 31 22 - 32 mmol/L   Glucose, Bld 139 (H) 70 - 99 mg/dL   BUN 13 8 - 23 mg/dL   Creatinine, Ser 5.63 0.44 - 1.00 mg/dL   Calcium 9.8 8.9 - 87.5 mg/dL   Total Protein 7.0 6.5 - 8.1 g/dL   Albumin 3.8 3.5 - 5.0 g/dL   AST 20 15 - 41 U/L  ALT 11 0 - 44 U/L   Alkaline Phosphatase 63 38 - 126 U/L   Total Bilirubin 0.4 0.3 - 1.2 mg/dL   GFR, Estimated >16 >10 mL/min   Anion gap 8 5 - 15  Brain natriuretic peptide  Result Value Ref Range   B Natriuretic Peptide 77.6 0.0 - 100.0 pg/mL  Magnesium  Result Value Ref Range   Magnesium 2.1 1.7 - 2.4 mg/dL  CBC  Result Value Ref Range   WBC 5.8 4.0 - 10.5 K/uL   RBC 3.98 3.87 - 5.11 MIL/uL   Hemoglobin 11.5 (L) 12.0 - 15.0 g/dL   HCT 96.0 (L) 45.4 - 09.8 %   MCV 89.4 80.0 - 100.0 fL   MCH 28.9 26.0 - 34.0 pg   MCHC 32.3 30.0 - 36.0 g/dL   RDW 11.9 14.7 - 82.9 %   Platelets 168 150 - 400 K/uL   nRBC 0.0 0.0 - 0.2 %  Basic metabolic panel  Result Value Ref Range   Sodium 135 135 - 145 mmol/L   Potassium 3.8 3.5 - 5.1 mmol/L   Chloride 99 98 - 111 mmol/L   CO2 28 22 - 32 mmol/L   Glucose, Bld 102 (H) 70 - 99 mg/dL   BUN 16 8 - 23 mg/dL   Creatinine, Ser 5.62 0.44 - 1.00 mg/dL   Calcium 8.8 (L) 8.9 - 10.3 mg/dL   GFR, Estimated >13 >08 mL/min   Anion gap 8 5 - 15  Legionella Pneumophila Serogp 1 Ur Ag  Result Value Ref Range   L. pneumophila Serogp 1 Ur Ag Negative Negative   Source of Sample URINE, CLEAN CATCH   Strep pneumoniae urinary antigen  Result  Value Ref Range   Strep Pneumo Urinary Antigen NEGATIVE NEGATIVE  CBC  Result Value Ref Range   WBC 4.5 4.0 - 10.5 K/uL   RBC 3.57 (L) 3.87 - 5.11 MIL/uL   Hemoglobin 10.3 (L) 12.0 - 15.0 g/dL   HCT 65.7 (L) 84.6 - 96.2 %   MCV 90.5 80.0 - 100.0 fL   MCH 28.9 26.0 - 34.0 pg   MCHC 31.9 30.0 - 36.0 g/dL   RDW 95.2 84.1 - 32.4 %   Platelets 175 150 - 400 K/uL   nRBC 0.0 0.0 - 0.2 %  Basic metabolic panel  Result Value Ref Range   Sodium 138 135 - 145 mmol/L   Potassium 3.1 (L) 3.5 - 5.1 mmol/L   Chloride 105 98 - 111 mmol/L   CO2 26 22 - 32 mmol/L   Glucose, Bld 90 70 - 99 mg/dL   BUN 15 8 - 23 mg/dL   Creatinine, Ser 4.01 0.44 - 1.00 mg/dL   Calcium 8.4 (L) 8.9 - 10.3 mg/dL   GFR, Estimated >02 >72 mL/min   Anion gap 7 5 - 15  CBC  Result Value Ref Range   WBC 4.5 4.0 - 10.5 K/uL   RBC 3.74 (L) 3.87 - 5.11 MIL/uL   Hemoglobin 10.7 (L) 12.0 - 15.0 g/dL   HCT 53.6 (L) 64.4 - 03.4 %   MCV 88.2 80.0 - 100.0 fL   MCH 28.6 26.0 - 34.0 pg   MCHC 32.4 30.0 - 36.0 g/dL   RDW 74.2 59.5 - 63.8 %   Platelets 223 150 - 400 K/uL   nRBC 0.0 0.0 - 0.2 %  Basic metabolic panel  Result Value Ref Range   Sodium 138 135 - 145 mmol/L   Potassium 3.0 (L) 3.5 -  5.1 mmol/L   Chloride 105 98 - 111 mmol/L   CO2 26 22 - 32 mmol/L   Glucose, Bld 98 70 - 99 mg/dL   BUN 17 8 - 23 mg/dL   Creatinine, Ser 1.61 0.44 - 1.00 mg/dL   Calcium 8.6 (L) 8.9 - 10.3 mg/dL   GFR, Estimated >09 >60 mL/min   Anion gap 7 5 - 15  Troponin I (High Sensitivity)  Result Value Ref Range   Troponin I (High Sensitivity) 9 <18 ng/L  Troponin I (High Sensitivity)  Result Value Ref Range   Troponin I (High Sensitivity) 10 <18 ng/L       Pertinent labs & imaging results that were available during my care of the patient were reviewed by me and considered in my medical decision making.  Assessment & Plan:  Mckenzie [name] was seen today for hospitalization follow-up.  Diagnoses and all orders for this visit:  Hospital  discharge follow-up Multifocal pneumonia Acute hypoxic respiratory failure [facility] Today's visit was for Transitional Care Management.  The patient was discharged from [facility] on [facility]15/2023 with a primary diagnosis of community acquired pneumonia, acute respiratory failure with hypoxia. Mckenzie [name] with the patient and/or caregiver, by a clinical staff member, was made on 04/16/2023 and was documented as a telephone encounter within the EMR.  Through chart review and discussion with the patient I have determined that management of their condition is of high complexity.   -     BMP8+EGFR -     CBC with Differential/Platelet -     albuterol (VENTOLIN HFA) 108 (90 Base) MCG/ACT inhaler; Inhale 2 puffs into the lungs every 6 (six) hours as needed for wheezing or shortness of breath.     Continue all other maintenance medications.  Follow up plan: Return in about 6 weeks (around 05/30/2023) for repeat CXR.   Continue healthy lifestyle choices, including diet (rich in fruits, vegetables, and lean proteins, and low in salt and simple carbohydrates) and exercise (at least 30 minutes of moderate physical activity daily).  Educational handout given for CAP  The above assessment and management plan was discussed with the patient. The patient verbalized understanding of and has agreed to the management plan. Patient is aware to call the clinic if they develop any new symptoms or if symptoms persist or worsen. Patient is aware when to return to the clinic for a follow-up visit. Patient educated on when it is appropriate to go to the emergency department.   Kari Baars, FNP-C [facility] [phone]

## 2023-07-09 ENCOUNTER — Encounter (INDEPENDENT_AMBULATORY_CARE_PROVIDER_SITE_OTHER): Payer: Medicare Other | Admitting: Ophthalmology

## 2023-07-09 DIAGNOSIS — H35033 Hypertensive retinopathy, bilateral: Secondary | ICD-10-CM | POA: Diagnosis not present

## 2023-07-09 DIAGNOSIS — I10 Essential (primary) hypertension: Secondary | ICD-10-CM | POA: Diagnosis not present

## 2023-07-09 DIAGNOSIS — H34833 Tributary (branch) retinal vein occlusion, bilateral, with macular edema: Secondary | ICD-10-CM | POA: Diagnosis not present

## 2023-07-18 ENCOUNTER — Encounter: Payer: Self-pay | Admitting: Family Medicine

## 2023-07-18 ENCOUNTER — Ambulatory Visit (INDEPENDENT_AMBULATORY_CARE_PROVIDER_SITE_OTHER): Payer: Medicare Other | Admitting: Family Medicine

## 2023-07-18 VITALS — BP 136/79 | HR 75 | Temp 97.3°F

## 2023-07-18 DIAGNOSIS — E1169 Type 2 diabetes mellitus with other specified complication: Secondary | ICD-10-CM | POA: Diagnosis not present

## 2023-07-18 DIAGNOSIS — F03911 Unspecified dementia, unspecified severity, with agitation: Secondary | ICD-10-CM

## 2023-07-18 DIAGNOSIS — E1165 Type 2 diabetes mellitus with hyperglycemia: Secondary | ICD-10-CM

## 2023-07-18 DIAGNOSIS — E1159 Type 2 diabetes mellitus with other circulatory complications: Secondary | ICD-10-CM

## 2023-07-18 DIAGNOSIS — E785 Hyperlipidemia, unspecified: Secondary | ICD-10-CM

## 2023-07-18 DIAGNOSIS — E559 Vitamin D deficiency, unspecified: Secondary | ICD-10-CM

## 2023-07-18 DIAGNOSIS — I152 Hypertension secondary to endocrine disorders: Secondary | ICD-10-CM

## 2023-07-18 LAB — URINALYSIS, ROUTINE W REFLEX MICROSCOPIC
Bilirubin, UA: NEGATIVE
Glucose, UA: NEGATIVE
Ketones, UA: NEGATIVE
Leukocytes,UA: NEGATIVE
Nitrite, UA: NEGATIVE
Protein,UA: NEGATIVE
Specific Gravity, UA: 1.015 (ref 1.005–1.030)
Urobilinogen, Ur: 0.2 mg/dL (ref 0.2–1.0)
pH, UA: 7 (ref 5.0–7.5)

## 2023-07-18 LAB — MICROSCOPIC EXAMINATION
Epithelial Cells (non renal): NONE SEEN /[HPF] (ref 0–10)
Renal Epithel, UA: NONE SEEN /[HPF]
WBC, UA: NONE SEEN /[HPF] (ref 0–5)
Yeast, UA: NONE SEEN

## 2023-07-18 LAB — BAYER DCA HB A1C WAIVED: HB A1C (BAYER DCA - WAIVED): 5.1 % (ref 4.8–5.6)

## 2023-07-18 MED ORDER — AMLODIPINE BESYLATE 10 MG PO TABS
10.0000 mg | ORAL_TABLET | Freq: Every day | ORAL | 1 refills | Status: DC
Start: 2023-07-18 — End: 2024-01-21

## 2023-07-18 NOTE — Patient Instructions (Signed)

## 2023-07-18 NOTE — Progress Notes (Signed)
Subjective:  [name], female    DOB: 08-02-1946, 77 y.o.   MRN: [id number]  Patient Care Team: Sonny Masters, FNP as PCP - General (Family Medicine) Lanelle Bal, DO as Consulting Physician (Internal Medicine) Benjiman Core, MD (Inactive) as Consulting Physician (Oncology) [facility]   Chief Complaint:  Medical Management of Chronic Issues (3 month follow up )   HPI: Mckenzie [name] is a 77 y.o. female presenting on 07/18/2023 for Medical Management of Chronic Issues (3 month follow up )   Discussed the use of AI scribe software for clinical note transcription with the patient, who gave verbal consent to proceed.  History of Present Illness   The patient, with a history of hospitalization for a respiratory condition, has been doing well since the last visit. There have been no changes in her medications, and she denies any new symptoms. However, she reports a mix of constipation and diarrhea, for which she takes Imodium or Pepto Bismol capsules as needed. She also reports occasional swelling in her feet, which is usually managed by keeping them elevated. The patient's blood pressure has been slightly elevated, but she attributes this to nervousness during the visit. She continues to take Lisinopril 10mg  daily and Amlodipine 10mg  at night for blood pressure management. She also takes Aricept for cognitive function and Lexapro for mood stabilization. The patient reports no new issues with these medications. She also reports occasional back pain, which is managed with over-the-counter medications.    She is taking her Vit D repletion therapy and denies any recent fractures.      Relevant past medical, surgical, family, and social history reviewed and updated as indicated.  Allergies and medications reviewed and updated. Data reviewed: Chart in Epic.   Past Medical History:  Diagnosis Date   Anemia    low iron   Arthritis     Depression    Diabetes (HCC) 11/30/2021   High cholesterol    Hypertension    Retinal micro-aneurysm of right eye    Stroke (HCC)    2 or 3 mini strokes    Past Surgical History:  Procedure Laterality Date   ARTERY BIOPSY Bilateral 12/15/2020   Procedure: BILATERAL TEMPORAL ARTERY BIOPSIES;  Surgeon: Chuck Hint, MD;  Location: [facility] OR;  Service: Vascular;  Laterality: Bilateral;   COLONOSCOPY WITH PROPOFOL N/A 12/09/2020   Procedure: COLONOSCOPY WITH PROPOFOL;  Surgeon: Iva Boop, MD;  Location: [facility] ENDOSCOPY;  Service: Endoscopy;  Laterality: N/A;   ESOPHAGOGASTRODUODENOSCOPY (EGD) WITH PROPOFOL N/A 12/09/2020   Procedure: ESOPHAGOGASTRODUODENOSCOPY (EGD) WITH PROPOFOL;  Surgeon: Iva Boop, MD;  Location: [facility] ENDOSCOPY;  Service: Endoscopy;  Laterality: N/A;   EYE SURGERY     FINGER SURGERY Left    RING FINGER   IR THORACENTESIS ASP PLEURAL SPACE W/IMG GUIDE  12/13/2020   OPEN REDUCTION INTERNAL FIXATION (ORIF) DISTAL RADIAL FRACTURE Left 10/05/2020   Procedure: OPEN REDUCTION INTERNAL FIXATION (ORIF) DISTAL RADIAL FRACTURE;  Surgeon: Roby Lofts, MD;  Location: [facility] OR;  Service: Orthopedics;  Laterality: Left;  supraclavicular block   POLYPECTOMY  12/09/2020   Procedure: POLYPECTOMY;  Surgeon: Iva Boop, MD;  Location: [facility] ENDOSCOPY;  Service: Endoscopy;;   SPINAL FUSION  08/10/2021   T10-L2   TUBAL LIGATION      Social History   Socioeconomic History   Marital status: Married    Spouse name: Not on file   Number of children: 3  Years of education: 4   Highest education level: Not on file  Occupational History   Not on file  Tobacco Use   Smoking status: Former    Current packs/day: 0.00    Average packs/day: 0.1 packs/day for 1 year (0.1 ttl pk-yrs)    Types: Cigarettes    Start date: 17    Quit date: 31    Years since quitting: 42.8   Smokeless tobacco: Never   Tobacco comments:    for a short period  Vaping Use   Vaping  status: Never Used  Substance and Sexual Activity   Alcohol use: No   Drug use: No   Sexual activity: Not on file  Other Topics Concern   Not on file  Social History Narrative   Right handed   Caffeine use: coffee daily   Social Determinants of Health   Financial Resource Strain: Not on file  Food Insecurity: No Food Insecurity (04/12/2023)   Hunger Vital Sign    Worried About Running Out of Food in the Last Year: Never true    Ran Out of Food in the Last Year: Never true  Transportation Needs: No Transportation Needs (04/12/2023)   PRAPARE - Administrator, Civil Service (Medical): No    Lack of Transportation (Non-Medical): No  Physical Activity: Not on file  Stress: Not on file  Social Connections: Not on file  Intimate Partner Violence: Not At Risk (04/12/2023)   Humiliation, Afraid, Rape, and Kick questionnaire    Fear of Current or Ex-Partner: No    Emotionally Abused: No    Physically Abused: No    Sexually Abused: No    Outpatient Encounter Medications as of 07/18/2023  Medication Sig   albuterol (VENTOLIN HFA) 108 (90 Base) MCG/ACT inhaler Inhale 2 puffs into the lungs every 6 (six) hours as needed for wheezing or shortness of breath.   ARIPiprazole (ABILIFY) 5 MG tablet Take 5 mg by mouth every evening.   aspirin EC 81 MG tablet Take 81 mg by mouth daily. Swallow whole.   Calcium Carb-Cholecalciferol (CALCIUM+D3 PO) Take 2 tablets by mouth in the morning.   COMBIGAN 0.2-0.5 % ophthalmic solution Place 1 drop into both eyes every 12 (twelve) hours.   dorzolamide (TRUSOPT) 2 % ophthalmic solution Place 1 drop into the left eye 2 (two) times daily.   Ergocalciferol (VITAMIN D2) 50 MCG (2000 UT) TABS Take 1 tablet by mouth daily.   escitalopram (LEXAPRO) 10 MG tablet Take 1 tablet (10 mg total) by mouth daily after breakfast.   guaiFENesin-dextromethorphan (ROBITUSSIN DM) 100-10 MG/5ML syrup Take 5 mLs by mouth every 4 (four) hours as needed for cough.    ketorolac (ACULAR) 0.4 % SOLN Place 1 drop into the right eye 3 (three) times daily.   latanoprost (XALATAN) 0.005 % ophthalmic solution Place 1 drop into both eyes at bedtime.   lisinopril (ZESTRIL) 10 MG tablet Take 1 tablet (10 mg total) by mouth daily.   magnesium oxide (MAG-OX) 400 MG tablet Take 400 mg by mouth in the morning.   Multiple Vitamin (MULTIVITAMIN WITH MINERALS) TABS tablet Take 1 tablet by mouth in the morning. One A Day Multivitamin   risperiDONE (RISPERDAL) 0.25 MG tablet Take 1 tablet (0.25 mg total) by mouth 2 (two) times daily.   [DISCONTINUED] amLODipine (NORVASC) 10 MG tablet Take 1 tablet (10 mg total) by mouth at bedtime.   amLODipine (NORVASC) 10 MG tablet Take 1 tablet (10 mg total) by mouth at bedtime.  No facility-administered encounter medications on file as of 07/18/2023.    Allergies  Allergen Reactions   Aldomet [Methyldopa] Other (See Comments)    Flu-like symptoms   Fosamax [Alendronate Sodium] Other (See Comments)    Weakness/fatigue    Plavix [Clopidogrel] Other (See Comments)    Arm swelled, bleeding   Acetazolamide Rash   Buspar [Buspirone] Palpitations    ROS per HPI, otherwise negative.       Objective:  BP 136/79 Comment: home reading  Pulse 75   Temp (!) 97.3 F (36.3 C) (Temporal)   SpO2 96%    Wt Readings from Last 3 Encounters:  04/18/23 98 lb (44.5 kg)  04/12/23 99 lb (44.9 kg)  09/10/22 111 lb (50.3 kg)    Physical Exam Vitals and nursing note reviewed.  Constitutional:      General: She is not in acute distress.    Appearance: She is ill-appearing (chronically). She is not toxic-appearing or diaphoretic.  HENT:     Head: Normocephalic and atraumatic.     Nose: Nose normal.     Mouth/Throat:     Mouth: Mucous membranes are moist.     Pharynx: Oropharynx is clear.  Eyes:     Pupils: Pupils are equal, round, and reactive to light.  Cardiovascular:     Rate and Rhythm: Normal rate and regular rhythm.     Heart  sounds: Normal heart sounds.  Pulmonary:     Effort: Pulmonary effort is normal.     Breath sounds: Normal breath sounds. No wheezing, rhonchi or rales.  Musculoskeletal:     Comments: Kyphosis   Skin:    General: Skin is warm and dry.     Capillary Refill: Capillary refill takes less than 2 seconds.  Neurological:     Mental Status: She is alert. Mental status is at baseline.     Gait: Gait abnormal (in wheelchair).  Psychiatric:        Mood and Affect: Mood normal.        Behavior: Behavior normal.     Results for orders placed or performed in visit on 04/18/23  [facility]  Result Value Ref Range   Glucose 113 (H) 70 - 99 mg/dL   BUN 12 8 - 27 mg/dL   Creatinine, Ser 1.61 (L) 0.57 - 1.00 mg/dL   eGFR 94 >09 UE/AVW/0.98   BUN/Creatinine Ratio 21 12 - 28   Sodium 144 134 - 144 mmol/L   Potassium 3.8 3.5 - 5.2 mmol/L   Chloride 106 96 - 106 mmol/L   CO2 22 20 - 29 mmol/L   Calcium 9.7 8.7 - 10.3 mg/dL  CBC with Differential/Platelet  Result Value Ref Range   WBC 8.6 3.4 - 10.8 x10E3/uL   RBC 3.99 3.77 - 5.28 x10E6/uL   Hemoglobin 11.3 11.1 - 15.9 g/dL   Hematocrit 11.9 14.7 - 46.6 %   MCV 88 79 - 97 fL   MCH 28.3 26.6 - 33.0 pg   MCHC 32.1 31.5 - 35.7 g/dL   RDW 82.9 56.2 - 13.0 %   Platelets 415 150 - 450 x10E3/uL   Neutrophils 74 Not Estab. %   Lymphs 17 Not Estab. %   Monocytes 6 Not Estab. %   Eos 2 Not Estab. %   Basos 0 Not Estab. %   Neutrophils Absolute 6.4 1.4 - 7.0 x10E3/uL   Lymphocytes Absolute 1.4 0.7 - 3.1 x10E3/uL   Monocytes Absolute 0.5 0.1 - 0.9 x10E3/uL   EOS (ABSOLUTE) 0.2  0.0 - 0.4 x10E3/uL   Basophils Absolute 0.0 0.0 - 0.2 x10E3/uL   Immature Granulocytes 1 Not Estab. %   Immature Grans (Abs) 0.0 0.0 - 0.1 x10E3/uL       Pertinent labs & imaging results that were available during my care of the patient were reviewed by me and considered in my medical decision making.  Assessment & Plan:  Jase was seen today for medical management of  chronic issues.  Assessment and Plan    Hypertension   Blood pressure elevated today, but patient reports home readings around 136/79. No changes in medication regimen.   -Continue Lisinopril 10mg  daily and Amlodipine 10mg  nightly.    Gastrointestinal symptoms   Reports alternating diarrhea and constipation. Currently using over-the-counter remedies (Imodium, Pepto Bismol) with some relief.   -Continue current management strategy.    Lower extremity edema   Reports swelling in feet, particularly when kept down. No current swelling observed.   -Elevate feet when sitting. Notify office if swelling persists despite elevation.    Dementia   On Aricept and Lexapro. No reported issues.   -Continue Aricept and Lexapro as prescribed.    General Health Maintenance / Followup Plans   -Obtain labs today as she has not been updated since hospitalization in June.   -Plan for follow-up in six months unless labs reveal any abnormalities requiring sooner follow-up.   -If any new issues arise, patient to contact office.      Diagnoses and all orders for this visit:  Hypertension associated with diabetes (HCC) -     amLODipine (NORVASC) 10 MG tablet; Take 1 tablet (10 mg total) by mouth at bedtime. -     Microalbumin / creatinine urine ratio -     CMP14+EGFR -     CBC with Differential/Platelet -     Thyroid Panel With TSH  Type 2 diabetes mellitus with hyperglycemia, without long-term current use of insulin (HCC) -     Microalbumin / creatinine urine ratio -     Bayer DCA Hb A1c Waived -     CMP14+EGFR -     CBC with Differential/Platelet -     Lipid panel -     Thyroid Panel With TSH  Hyperlipidemia associated with type 2 diabetes mellitus (HCC) -     CMP14+EGFR -     Lipid panel  Vitamin D deficiency -     CMP14+EGFR -     VITAMIN D 25 Hydroxy (Vit-D Deficiency, Fractures)  Agitation due to dementia (HCC) -     Urinalysis, Routine w reflex microscopic     Continue all other  maintenance medications.  Follow up plan: Return in about 6 months (around 01/16/2024), or if symptoms worsen or fail to improve, for CPE.   Continue healthy lifestyle choices, including diet (rich in fruits, vegetables, and lean proteins, and low in salt and simple carbohydrates) and exercise (at least 30 minutes of moderate physical activity daily).  Educational handout given for DM  The above assessment and management plan was discussed with the patient. The patient verbalized understanding of and has agreed to the management plan. Patient is aware to call the clinic if they develop any new symptoms or if symptoms persist or worsen. Patient is aware when to return to the clinic for a follow-up visit. Patient educated on when it is appropriate to go to the emergency department.   Kari Baars, FNP-C [facility] [phone]

## 2023-07-19 LAB — CBC WITH DIFFERENTIAL/PLATELET
Basophils Absolute: 0 10*3/uL (ref 0.0–0.2)
Basos: 1 %
EOS (ABSOLUTE): 0.1 10*3/uL (ref 0.0–0.4)
Eos: 2 %
Hematocrit: 42.7 % (ref 34.0–46.6)
Hemoglobin: 13.4 g/dL (ref 11.1–15.9)
Immature Grans (Abs): 0 10*3/uL (ref 0.0–0.1)
Immature Granulocytes: 1 %
Lymphocytes Absolute: 1.8 10*3/uL (ref 0.7–3.1)
Lymphs: 29 %
MCH: 28.5 pg (ref 26.6–33.0)
MCHC: 31.4 g/dL — ABNORMAL LOW (ref 31.5–35.7)
MCV: 91 fL (ref 79–97)
Monocytes Absolute: 0.3 10*3/uL (ref 0.1–0.9)
Monocytes: 5 %
Neutrophils Absolute: 3.9 10*3/uL (ref 1.4–7.0)
Neutrophils: 62 %
Platelets: 348 10*3/uL (ref 150–450)
RBC: 4.7 x10E6/uL (ref 3.77–5.28)
RDW: 12.4 % (ref 11.7–15.4)
WBC: 6.2 10*3/uL (ref 3.4–10.8)

## 2023-07-19 LAB — MICROALBUMIN / CREATININE URINE RATIO
Creatinine, Urine: 33.2 mg/dL
Microalb/Creat Ratio: 115 mg/g{creat} — ABNORMAL HIGH (ref 0–29)
Microalbumin, Urine: 38.3 ug/mL

## 2023-07-19 LAB — CMP14+EGFR
ALT: 9 [IU]/L (ref 0–32)
AST: 17 [IU]/L (ref 0–40)
Albumin: 4 g/dL (ref 3.8–4.8)
Alkaline Phosphatase: 119 [IU]/L (ref 44–121)
BUN/Creatinine Ratio: 18 (ref 12–28)
BUN: 10 mg/dL (ref 8–27)
Bilirubin Total: 0.2 mg/dL (ref 0.0–1.2)
CO2: 23 mmol/L (ref 20–29)
Calcium: 10.3 mg/dL (ref 8.7–10.3)
Chloride: 103 mmol/L (ref 96–106)
Creatinine, Ser: 0.55 mg/dL — ABNORMAL LOW (ref 0.57–1.00)
Globulin, Total: 3.5 g/dL (ref 1.5–4.5)
Glucose: 97 mg/dL (ref 70–99)
Potassium: 3.9 mmol/L (ref 3.5–5.2)
Sodium: 145 mmol/L — ABNORMAL HIGH (ref 134–144)
Total Protein: 7.5 g/dL (ref 6.0–8.5)
eGFR: 94 mL/min/{1.73_m2} (ref >59)

## 2023-07-19 LAB — THYROID PANEL WITH TSH
Free Thyroxine Index: 2.1 (ref 1.2–4.9)
T3 Uptake Ratio: 24 % (ref 24–39)
T4, Total: 8.9 ug/dL (ref 4.5–12.0)
TSH: 1.32 u[IU]/mL (ref 0.450–4.500)

## 2023-07-19 LAB — LIPID PANEL
Chol/HDL Ratio: 4.8 {ratio} — ABNORMAL HIGH (ref 0.0–4.4)
Cholesterol, Total: 258 mg/dL — ABNORMAL HIGH (ref 100–199)
HDL: 54 mg/dL (ref >39)
LDL Chol Calc (NIH): 174 mg/dL — ABNORMAL HIGH (ref 0–99)
Triglycerides: 165 mg/dL — ABNORMAL HIGH (ref 0–149)
VLDL Cholesterol Cal: 30 mg/dL (ref 5–40)

## 2023-07-19 LAB — VITAMIN D 25 HYDROXY (VIT D DEFICIENCY, FRACTURES): Vit D, 25-Hydroxy: 104 ng/mL — ABNORMAL HIGH (ref 30.0–100.0)

## 2024-01-14 ENCOUNTER — Encounter (INDEPENDENT_AMBULATORY_CARE_PROVIDER_SITE_OTHER): Payer: Medicare Other | Admitting: Ophthalmology

## 2024-01-14 DIAGNOSIS — I10 Essential (primary) hypertension: Secondary | ICD-10-CM

## 2024-01-14 DIAGNOSIS — H35033 Hypertensive retinopathy, bilateral: Secondary | ICD-10-CM | POA: Diagnosis not present

## 2024-01-14 DIAGNOSIS — H34833 Tributary (branch) retinal vein occlusion, bilateral, with macular edema: Secondary | ICD-10-CM | POA: Diagnosis not present

## 2024-01-22 ENCOUNTER — Encounter: Payer: Self-pay | Admitting: Family Medicine

## 2024-01-22 ENCOUNTER — Ambulatory Visit (INDEPENDENT_AMBULATORY_CARE_PROVIDER_SITE_OTHER): Payer: Medicare Other | Admitting: Family Medicine

## 2024-01-22 VITALS — BP 148/76 | HR 67 | Temp 96.9°F | Ht 61.0 in

## 2024-01-22 DIAGNOSIS — E1169 Type 2 diabetes mellitus with other specified complication: Secondary | ICD-10-CM

## 2024-01-22 DIAGNOSIS — E559 Vitamin D deficiency, unspecified: Secondary | ICD-10-CM | POA: Diagnosis not present

## 2024-01-22 DIAGNOSIS — E1159 Type 2 diabetes mellitus with other circulatory complications: Secondary | ICD-10-CM

## 2024-01-22 DIAGNOSIS — I152 Hypertension secondary to endocrine disorders: Secondary | ICD-10-CM | POA: Diagnosis not present

## 2024-01-22 DIAGNOSIS — E1165 Type 2 diabetes mellitus with hyperglycemia: Secondary | ICD-10-CM | POA: Diagnosis not present

## 2024-01-22 DIAGNOSIS — F03911 Unspecified dementia, unspecified severity, with agitation: Secondary | ICD-10-CM

## 2024-01-22 DIAGNOSIS — E785 Hyperlipidemia, unspecified: Secondary | ICD-10-CM

## 2024-01-22 LAB — BAYER DCA HB A1C WAIVED: HB A1C (BAYER DCA - WAIVED): 5.2 % (ref 4.8–5.6)

## 2024-01-22 MED ORDER — LISINOPRIL 10 MG PO TABS
10.0000 mg | ORAL_TABLET | Freq: Every day | ORAL | 1 refills | Status: DC
Start: 2024-01-22 — End: 2024-08-12

## 2024-01-22 MED ORDER — AMLODIPINE BESYLATE 10 MG PO TABS
10.0000 mg | ORAL_TABLET | Freq: Every day | ORAL | 1 refills | Status: DC
Start: 2024-01-22 — End: 2024-08-12

## 2024-01-22 MED ORDER — HYDROCHLOROTHIAZIDE 12.5 MG PO TABS
12.5000 mg | ORAL_TABLET | Freq: Every day | ORAL | 3 refills | Status: AC
Start: 2024-01-22 — End: ?

## 2024-01-22 NOTE — Progress Notes (Signed)
 Subjective:  [name], female    DOB: 12-10-1945, 78 y.o.   MRN: [id number]  Patient Care Team: Sonny Masters, FNP as PCP - General (Family Medicine) Lanelle Bal, DO as Consulting Physician (Internal Medicine) Benjiman Core, MD (Inactive) as Consulting Physician (Oncology) [facility]   Chief Complaint:  Annual Exam   HPI: Mckenzie [name] is a 78 y.o. female presenting on 01/22/2024 for Annual Exam    History of Present Illness   Mckenzie [name] is a 78 year old female with diabetes and hypertension who presents for a routine follow-up visit.  Diabetes is well-controlled with a recent A1c of 5.1 in October. She no longer needs to check her blood sugar frequently.  She experiences occasional mild back pain, which is manageable. No other significant pain or discomfort is reported.  She has frequent episodes of diarrhea and has not increased her fiber intake to manage this symptom.  Blood pressure readings fluctuate, with recent measurements ranging from 123/74 to 155/82. She is currently taking amlodipine 10 mg and lisinopril 10 mg daily. She notes leg swelling when her legs are down for a while, which subsides with elevation.  She does not take any cholesterol medication and does not use over-the-counter supplements like red yeast rice or fish oil.  She is currently taking Risperdal 0.25 mg at night for agitation and reports doing well on this medication.          Relevant past medical, surgical, family, and social history reviewed and updated as indicated.  Allergies and medications reviewed and updated. Data reviewed: Chart in Epic.   Past Medical History:  Diagnosis Date   Anemia    low iron   Arthritis    Depression    Diabetes (HCC) 11/30/2021   High cholesterol    Hypertension    Retinal micro-aneurysm of right eye    Stroke (HCC)    2 or 3 mini strokes    Past Surgical History:  Procedure Laterality  Date   ARTERY BIOPSY Bilateral 12/15/2020   Procedure: BILATERAL TEMPORAL ARTERY BIOPSIES;  Surgeon: Chuck Hint, MD;  Location: [facility] OR;  Service: Vascular;  Laterality: Bilateral;   COLONOSCOPY WITH PROPOFOL N/A 12/09/2020   Procedure: COLONOSCOPY WITH PROPOFOL;  Surgeon: Iva Boop, MD;  Location: [facility] ENDOSCOPY;  Service: Endoscopy;  Laterality: N/A;   ESOPHAGOGASTRODUODENOSCOPY (EGD) WITH PROPOFOL N/A 12/09/2020   Procedure: ESOPHAGOGASTRODUODENOSCOPY (EGD) WITH PROPOFOL;  Surgeon: Iva Boop, MD;  Location: [facility] ENDOSCOPY;  Service: Endoscopy;  Laterality: N/A;   EYE SURGERY     FINGER SURGERY Left    RING FINGER   IR THORACENTESIS ASP PLEURAL SPACE W/IMG GUIDE  12/13/2020   OPEN REDUCTION INTERNAL FIXATION (ORIF) DISTAL RADIAL FRACTURE Left 10/05/2020   Procedure: OPEN REDUCTION INTERNAL FIXATION (ORIF) DISTAL RADIAL FRACTURE;  Surgeon: Roby Lofts, MD;  Location: [facility] OR;  Service: Orthopedics;  Laterality: Left;  supraclavicular block   POLYPECTOMY  12/09/2020   Procedure: POLYPECTOMY;  Surgeon: Iva Boop, MD;  Location: [facility] ENDOSCOPY;  Service: Endoscopy;;   SPINAL FUSION  08/10/2021   T10-L2   TUBAL LIGATION      Social History   Socioeconomic History   Marital status: Married    Spouse name: Not on file   Number of children: 3   Years of education: 12   Highest education level: Not on file  Occupational History   Not on file  Tobacco  Use   Smoking status: Former    Current packs/day: 0.00    Average packs/day: 0.1 packs/day for 1 year (0.1 ttl pk-yrs)    Types: Cigarettes    Start date: 53    Quit date: 57    Years since quitting: 43.3   Smokeless tobacco: Never   Tobacco comments:    for a short period  Vaping Use   Vaping status: Never Used  Substance and Sexual Activity   Alcohol use: No   Drug use: No   Sexual activity: Not on file  Other Topics Concern   Not on file  Social History Narrative   Right handed   Caffeine use:  coffee daily   Social Drivers of Corporate investment banker Strain: Not on file  Food Insecurity: No Food Insecurity (04/12/2023)   Hunger Vital Sign    Worried About Running Out of Food in the Last Year: Never true    Ran Out of Food in the Last Year: Never true  Transportation Needs: No Transportation Needs (04/12/2023)   PRAPARE - Administrator, Civil Service (Medical): No    Lack of Transportation (Non-Medical): No  Physical Activity: Not on file  Stress: Not on file  Social Connections: Not on file  Intimate Partner Violence: Not At Risk (04/12/2023)   Humiliation, Afraid, Rape, and Kick questionnaire    Fear of Current or Ex-Partner: No    Emotionally Abused: No    Physically Abused: No    Sexually Abused: No    Outpatient Encounter Medications as of 01/22/2024  Medication Sig   aspirin EC 81 MG tablet Take 81 mg by mouth daily. Swallow whole.   Calcium Carb-Cholecalciferol (CALCIUM+D3 PO) Take 2 tablets by mouth in the morning.   COMBIGAN 0.2-0.5 % ophthalmic solution Place 1 drop into both eyes every 12 (twelve) hours.   dorzolamide (TRUSOPT) 2 % ophthalmic solution Place 1 drop into the left eye 2 (two) times daily.   Ergocalciferol (VITAMIN D2) 50 MCG (2000 UT) TABS Take 1 tablet by mouth daily.   escitalopram (LEXAPRO) 10 MG tablet Take 1 tablet (10 mg total) by mouth daily after breakfast.   hydrochlorothiazide (HYDRODIURIL) 12.5 MG tablet Take 1 tablet (12.5 mg total) by mouth daily.   ketorolac (ACULAR) 0.4 % SOLN Place 1 drop into the right eye 3 (three) times daily.   latanoprost (XALATAN) 0.005 % ophthalmic solution Place 1 drop into both eyes at bedtime.   magnesium oxide (MAG-OX) 400 MG tablet Take 400 mg by mouth in the morning.   Multiple Vitamin (MULTIVITAMIN WITH MINERALS) TABS tablet Take 1 tablet by mouth in the morning. One A Day Multivitamin   risperiDONE (RISPERDAL) 0.25 MG tablet Take 1 tablet (0.25 mg total) by mouth 2 (two) times daily.    albuterol (VENTOLIN HFA) 108 (90 Base) MCG/ACT inhaler Inhale 2 puffs into the lungs every 6 (six) hours as needed for wheezing or shortness of breath. (Patient not taking: Reported on 01/22/2024)   amLODipine (NORVASC) 10 MG tablet Take 1 tablet (10 mg total) by mouth at bedtime.   lisinopril (ZESTRIL) 10 MG tablet Take 1 tablet (10 mg total) by mouth daily.   [DISCONTINUED] amLODipine (NORVASC) 10 MG tablet Take 1 tablet (10 mg total) by mouth at bedtime.   [DISCONTINUED] ARIPiprazole (ABILIFY) 5 MG tablet Take 5 mg by mouth every evening.   [DISCONTINUED] guaiFENesin-dextromethorphan (ROBITUSSIN DM) 100-10 MG/5ML syrup Take 5 mLs by mouth every 4 (four) hours as needed  for cough.   [DISCONTINUED] lisinopril (ZESTRIL) 10 MG tablet Take 1 tablet (10 mg total) by mouth daily.   No facility-administered encounter medications on file as of 01/22/2024.    Allergies  Allergen Reactions   Aldomet [Methyldopa] Other (See Comments)    Flu-like symptoms   Fosamax [Alendronate Sodium] Other (See Comments)    Weakness/fatigue    Plavix [Clopidogrel] Other (See Comments)    Arm swelled, bleeding   Acetazolamide Rash   Buspar [Buspirone] Palpitations    Pertinent ROS per HPI, otherwise unremarkable      Objective:  BP (!) 148/76 (Cuff Size: Normal)   Pulse 67   Temp (!) 96.9 F (36.1 C)   Ht 5\' 1"  (1.549 m) Comment: wheelchair  SpO2 98%   BMI 18.52 kg/m    Wt Readings from Last 3 Encounters:  04/18/23 98 lb (44.5 kg)  04/12/23 99 lb (44.9 kg)  09/10/22 111 lb (50.3 kg)    Physical Exam Vitals and nursing note reviewed.  Constitutional:      Appearance: She is ill-appearing (chronically ill).  HENT:     Head: Normocephalic and atraumatic.     Nose: Nose normal.     Mouth/Throat:     Mouth: Mucous membranes are moist.  Eyes:     Conjunctiva/sclera: Conjunctivae normal.     Pupils: Pupils are equal, round, and reactive to light.  Cardiovascular:     Rate and Rhythm: Normal rate  and regular rhythm.     Heart sounds: Normal heart sounds.  Pulmonary:     Effort: Pulmonary effort is normal.     Breath sounds: Normal breath sounds.  Abdominal:     General: Bowel sounds are normal.     Palpations: Abdomen is soft.  Musculoskeletal:     Cervical back: Neck supple.     Right lower leg: No edema.     Left lower leg: No edema.     Comments: Kyphosis   Skin:    General: Skin is warm and dry.     Capillary Refill: Capillary refill takes less than 2 seconds.  Neurological:     Mental Status: She is alert. Mental status is at baseline.     Gait: Gait abnormal (in wheelchair).  Psychiatric:        Mood and Affect: Mood normal.        Cognition and Memory: Cognition is impaired. Memory is impaired.      Results for orders placed or performed in visit on 07/18/23  Microscopic Examination   Collection Time: 07/18/23 11:21 AM   Urine  Result Value Ref Range   WBC, UA None seen 0 - 5 /hpf   RBC, Urine 0-2 0 - 2 /hpf   Epithelial Cells (non renal) None seen 0 - 10 /hpf   Renal Epithel, UA None seen None seen /hpf   Bacteria, UA Few (A) None seen/Few   Yeast, UA None seen None seen  Microalbumin / creatinine urine ratio   Collection Time: 07/18/23 11:21 AM  Result Value Ref Range   Creatinine, Urine 33.2 Not Estab. mg/dL   Microalbumin, Urine 16.1 Not Estab. ug/mL   Microalb/Creat Ratio 115 (H) 0 - 29 mg/g creat  Urinalysis, Routine w reflex microscopic   Collection Time: 07/18/23 11:21 AM  Result Value Ref Range   Specific Gravity, UA 1.015 1.005 - 1.030   pH, UA 7.0 5.0 - 7.5   Color, UA Yellow Yellow   Appearance Ur Clear Clear   Leukocytes,UA  Negative Negative   Protein,UA Negative Negative/Trace   Glucose, UA Negative Negative   Ketones, UA Negative Negative   RBC, UA Trace (A) Negative   Bilirubin, UA Negative Negative   Urobilinogen, Ur 0.2 0.2 - 1.0 mg/dL   Nitrite, UA Negative Negative   Microscopic Examination See below:   CMP14+EGFR    Collection Time: 07/18/23 11:30 AM  Result Value Ref Range   Glucose 97 70 - 99 mg/dL   BUN 10 8 - 27 mg/dL   Creatinine, Ser 3.08 (L) 0.57 - 1.00 mg/dL   eGFR 94 >65 HQ/ION/6.29   BUN/Creatinine Ratio 18 12 - 28   Sodium 145 (H) 134 - 144 mmol/L   Potassium 3.9 3.5 - 5.2 mmol/L   Chloride 103 96 - 106 mmol/L   CO2 23 20 - 29 mmol/L   Calcium 10.3 8.7 - 10.3 mg/dL   Total Protein 7.5 6.0 - 8.5 g/dL   Albumin 4.0 3.8 - 4.8 g/dL   Globulin, Total 3.5 1.5 - 4.5 g/dL   Bilirubin Total 0.2 0.0 - 1.2 mg/dL   Alkaline Phosphatase 119 44 - 121 IU/L   AST 17 0 - 40 IU/L   ALT 9 0 - 32 IU/L  CBC with Differential/Platelet   Collection Time: 07/18/23 11:30 AM  Result Value Ref Range   WBC 6.2 3.4 - 10.8 x10E3/uL   RBC 4.70 3.77 - 5.28 x10E6/uL   Hemoglobin 13.4 11.1 - 15.9 g/dL   Hematocrit 52.8 41.3 - 46.6 %   MCV 91 79 - 97 fL   MCH 28.5 26.6 - 33.0 pg   MCHC 31.4 (L) 31.5 - 35.7 g/dL   RDW 24.4 01.0 - 27.2 %   Platelets 348 150 - 450 x10E3/uL   Neutrophils 62 Not Estab. %   Lymphs 29 Not Estab. %   Monocytes 5 Not Estab. %   Eos 2 Not Estab. %   Basos 1 Not Estab. %   Neutrophils Absolute 3.9 1.4 - 7.0 x10E3/uL   Lymphocytes Absolute 1.8 0.7 - 3.1 x10E3/uL   Monocytes Absolute 0.3 0.1 - 0.9 x10E3/uL   EOS (ABSOLUTE) 0.1 0.0 - 0.4 x10E3/uL   Basophils Absolute 0.0 0.0 - 0.2 x10E3/uL   Immature Granulocytes 1 Not Estab. %   Immature Grans (Abs) 0.0 0.0 - 0.1 x10E3/uL  Lipid panel   Collection Time: 07/18/23 11:30 AM  Result Value Ref Range   Cholesterol, Total 258 (H) 100 - 199 mg/dL   Triglycerides 536 (H) 0 - 149 mg/dL   HDL 54 >64 mg/dL   VLDL Cholesterol Cal 30 5 - 40 mg/dL   LDL Chol Calc (NIH) 403 (H) 0 - 99 mg/dL   Chol/HDL Ratio 4.8 (H) 0.0 - 4.4 ratio  Thyroid Panel With TSH   Collection Time: 07/18/23 11:30 AM  Result Value Ref Range   TSH 1.320 0.450 - 4.500 uIU/mL   T4, Total 8.9 4.5 - 12.0 ug/dL   T3 Uptake Ratio 24 24 - 39 %   Free Thyroxine Index 2.1 1.2  - 4.9  VITAMIN D 25 Hydroxy (Vit-D Deficiency, Fractures)   Collection Time: 07/18/23 11:30 AM  Result Value Ref Range   Vit D, 25-Hydroxy 104.0 (H) 30.0 - 100.0 ng/mL  Bayer DCA Hb A1c Waived   Collection Time: 07/18/23  1:53 PM  Result Value Ref Range   HB A1C (BAYER DCA - WAIVED) 5.1 4.8 - 5.6 %       Pertinent labs & imaging results that were available  during my care of the patient were reviewed by me and considered in my medical decision making.  Assessment & Plan:  Kimari was seen today for annual exam.  Diagnoses and all orders for this visit:  Type 2 diabetes mellitus with hyperglycemia, without long-term current use of insulin (HCC) -     CBC with Differential/Platelet -     CMP14+EGFR -     Lipid panel -     Thyroid Panel With TSH -     lisinopril (ZESTRIL) 10 MG tablet; Take 1 tablet (10 mg total) by mouth daily. -     Bayer DCA Hb A1c Waived  Hypertension associated with diabetes (HCC) -     CBC with Differential/Platelet -     CMP14+EGFR -     Lipid panel -     Thyroid Panel With TSH -     lisinopril (ZESTRIL) 10 MG tablet; Take 1 tablet (10 mg total) by mouth daily. -     amLODipine (NORVASC) 10 MG tablet; Take 1 tablet (10 mg total) by mouth at bedtime. -     hydrochlorothiazide (HYDRODIURIL) 12.5 MG tablet; Take 1 tablet (12.5 mg total) by mouth daily.  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 Currently on amlodipine 10 mg and lisinopril 10 mg. Mild leg swelling likely due to amlodipine. - Add hydrochlorothiazide 12.5 mg as needed for blood pressure control and leg swelling - Advise  taking hydrochlorothiazide in the morning to avoid nocturia - Monitor blood pressure regularly and administer hydrochlorothiazide if blood pressure is elevated or if leg swelling occurs  Type 2 Diabetes Mellitus Type 2 Diabetes Mellitus is well-controlled with an A1c of 5.1, below the prediabetic range. - Continue current management and monitor A1c every six months  Diarrhea Intermittent diarrhea. Increasing dietary fiber can bulk stools and reduce diarrhea episodes. - Increase dietary fiber intake using Metamucil or Benefiber daily or every other day as needed  Agitation Agitation is well-managed with Risperdal 0.25 mg at night. - Continue Risperdal 0.25 mg at night - Reassess in a few months for potential discontinuation  General Health Maintenance Routine health maintenance includes upcoming lab work for cholesterol and other parameters. Difficulty with blood draws due to challenging veins. Fasting advised before lab work. - Perform lab work including cholesterol levels - Advise fasting before lab work          Continue all other maintenance medications.  Follow up plan: Return in about 6 months (around 07/23/2024), or if symptoms worsen or fail to improve, for chronic follow up.   Continue healthy lifestyle choices, including diet (rich in fruits, vegetables, and lean proteins, and low in salt and simple carbohydrates) and exercise (at least 30 minutes of moderate physical activity daily).   The above assessment and management plan was discussed with the patient. The patient verbalized understanding of and has agreed to the management plan. Patient is aware to call the clinic if they develop any new symptoms or if symptoms persist or worsen. Patient is aware  when to return to the clinic for a follow-up visit. Patient educated on when it is appropriate to go to the emergency department.   Kattie Parrot, FNP-C [facility] [phone]

## 2024-01-23 LAB — CBC WITH DIFFERENTIAL/PLATELET
Basophils Absolute: 0 10*3/uL (ref 0.0–0.2)
Basos: 0 %
EOS (ABSOLUTE): 0.1 10*3/uL (ref 0.0–0.4)
Eos: 2 %
Hematocrit: 44.8 % (ref 34.0–46.6)
Hemoglobin: 14.3 g/dL (ref 11.1–15.9)
Immature Grans (Abs): 0 10*3/uL (ref 0.0–0.1)
Immature Granulocytes: 0 %
Lymphocytes Absolute: 1.9 10*3/uL (ref 0.7–3.1)
Lymphs: 24 %
MCH: 28.4 pg (ref 26.6–33.0)
MCHC: 31.9 g/dL (ref 31.5–35.7)
MCV: 89 fL (ref 79–97)
Monocytes Absolute: 0.3 10*3/uL (ref 0.1–0.9)
Monocytes: 4 %
Neutrophils Absolute: 5.4 10*3/uL (ref 1.4–7.0)
Neutrophils: 70 %
Platelets: 329 10*3/uL (ref 150–450)
RBC: 5.03 x10E6/uL (ref 3.77–5.28)
RDW: 13.1 % (ref 11.7–15.4)
WBC: 7.7 10*3/uL (ref 3.4–10.8)

## 2024-01-23 LAB — LIPID PANEL
Chol/HDL Ratio: 5.2 ratio — ABNORMAL HIGH (ref 0.0–4.4)
Cholesterol, Total: 299 mg/dL — ABNORMAL HIGH (ref 100–199)
HDL: 57 mg/dL (ref >39)
LDL Chol Calc (NIH): 210 mg/dL — ABNORMAL HIGH (ref 0–99)
Triglycerides: 171 mg/dL — ABNORMAL HIGH (ref 0–149)
VLDL Cholesterol Cal: 32 mg/dL (ref 5–40)

## 2024-01-23 LAB — CMP14+EGFR
ALT: 14 IU/L (ref 0–32)
AST: 31 IU/L (ref 0–40)
Albumin: 4.4 g/dL (ref 3.8–4.8)
Alkaline Phosphatase: 148 IU/L — ABNORMAL HIGH (ref 44–121)
BUN/Creatinine Ratio: 28 (ref 12–28)
BUN: 16 mg/dL (ref 8–27)
Bilirubin Total: 0.3 mg/dL (ref 0.0–1.2)
CO2: 18 mmol/L — ABNORMAL LOW (ref 20–29)
Calcium: 10.1 mg/dL (ref 8.7–10.3)
Chloride: 102 mmol/L (ref 96–106)
Creatinine, Ser: 0.58 mg/dL (ref 0.57–1.00)
Globulin, Total: 3.6 g/dL (ref 1.5–4.5)
Glucose: 100 mg/dL — ABNORMAL HIGH (ref 70–99)
Potassium: 5.2 mmol/L (ref 3.5–5.2)
Sodium: 140 mmol/L (ref 134–144)
Total Protein: 8 g/dL (ref 6.0–8.5)
eGFR: 93 mL/min/{1.73_m2} (ref >59)

## 2024-01-23 LAB — THYROID PANEL WITH TSH
Free Thyroxine Index: 2.2 (ref 1.2–4.9)
T3 Uptake Ratio: 24 % (ref 24–39)
T4, Total: 9.3 ug/dL (ref 4.5–12.0)
TSH: 1.12 u[IU]/mL (ref 0.450–4.500)

## 2024-01-23 LAB — VITAMIN D 25 HYDROXY (VIT D DEFICIENCY, FRACTURES): Vit D, 25-Hydroxy: 64.9 ng/mL (ref 30.0–100.0)

## 2024-01-26 ENCOUNTER — Telehealth: Payer: Self-pay

## 2024-01-26 NOTE — Telephone Encounter (Unsigned)
 Copied from CRM [id number]. Topic: Clinical - Medication Question >> Jan 26, 2024  4:18 PM Yolanda T wrote: Reason for CRM: patients husband Marylou Sobers called stated his insurance will cover Atorvastatin  and Rosuvastatin. He is wanting provider to look into prescribing those meds before she send in a script for patient cholesterol. Please f/u with patient

## 2024-01-27 MED ORDER — ATORVASTATIN CALCIUM 20 MG PO TABS
20.0000 mg | ORAL_TABLET | Freq: Every day | ORAL | 3 refills | Status: AC
Start: 2024-01-27 — End: ?

## 2024-01-27 NOTE — Addendum Note (Signed)
 Addended by: Galvin Jules on: 01/27/2024 11:57 AM   Modules accepted: Orders

## 2024-01-27 NOTE — Telephone Encounter (Signed)
Husband aware and verbalizes understanding per dpr. °

## 2024-04-12 ENCOUNTER — Ambulatory Visit: Payer: Self-pay

## 2024-04-12 NOTE — Telephone Encounter (Signed)
 FYI Only or Action Required?: FYI only for provider.  Patient was last seen in primary care on 01/22/2024 by Severa Rock HERO, FNP. Called Nurse Triage reporting Foot Pain. Symptoms began several months ago. Interventions attempted: Rest, hydration, or home remedies. Symptoms are: unchanged.  Triage Disposition: See Physician Within 24 Hours  Patient/caregiver understands and will follow disposition?: Yes    Copied from CRM [id number]. Topic: Clinical - Red Word Triage >> Apr 12, 2024  8:54 AM Tonda B wrote: [facility] that prompted transfer to Nurse Triage: patient is having a burning feeling with pain in the bottom of her foot as well as some swelling pt think it maybe from an rx Reason for Disposition  Numbness (i.e., loss of sensation) in foot or toes  (Exception: Just tingling; numbness present > 2 weeks.)  Answer Assessment - Initial Assessment Questions 1. ONSET: When did the pain start?      2-3 months Heel/L foot burn so bad she cannot sleep 2. LOCATION: Where is the pain located?      L foot 3. PAIN: How bad is the pain?    (Scale 1-10; or mild, moderate, severe)  - MILD (1-3): doesn't interfere with normal activities.   - MODERATE (4-7): interferes with normal activities (e.g., work or school) or awakens from sleep, limping.   - SEVERE (8-10): excruciating pain, unable to do any normal activities, unable to walk.      Unable to answer Reports cannot sleep at night and has been using numbing spray - reports temporary relief Has not taken any medication for pain - endorses taking Lipitor and water pills and vitamins 4. WORK OR EXERCISE: Has there been any recent work or exercise that involved this part of the body?      denies 5. CAUSE: What do you think is causing the foot pain?     unknown 6. OTHER SYMPTOMS: Do you have any other symptoms? (e.g., leg pain, rash, fever, numbness)     Occasional leg pain Denies rash, numbness, or fever 7. PREGNANCY: Is there any  chance you are pregnant? When was your last menstrual period?     N/a  Protocols used: Foot Pain-A-AH

## 2024-04-12 NOTE — Telephone Encounter (Signed)
 Scheduled for tomorrow.

## 2024-04-13 ENCOUNTER — Ambulatory Visit: Admitting: Family Medicine

## 2024-04-13 ENCOUNTER — Encounter: Payer: Self-pay | Admitting: Family Medicine

## 2024-04-13 ENCOUNTER — Ambulatory Visit (INDEPENDENT_AMBULATORY_CARE_PROVIDER_SITE_OTHER)

## 2024-04-13 VITALS — BP 157/78 | HR 77 | Temp 96.3°F

## 2024-04-13 DIAGNOSIS — M79672 Pain in left foot: Secondary | ICD-10-CM

## 2024-04-13 DIAGNOSIS — M7732 Calcaneal spur, left foot: Secondary | ICD-10-CM

## 2024-04-13 MED ORDER — NAPROXEN 500 MG PO TABS
500.0000 mg | ORAL_TABLET | Freq: Two times a day (BID) | ORAL | 0 refills | Status: AC
Start: 2024-04-13 — End: 2024-04-27

## 2024-04-13 NOTE — Progress Notes (Signed)
 Subjective:  [name], female    DOB: [birth year], 78 y.o.   MRN: [id number]  Patient Care Team: Severa Rock HERO, FNP as PCP - General (Family Medicine) Cindie Carlin POUR, DO as Consulting Physician (Internal Medicine) Amadeo Windell SAILOR, MD (Inactive) as Consulting Physician (Oncology) [facility]   Chief Complaint:  Foot Pain (Left heel pain x 2-3 months.  States it has a burning feeling )   HPI: Mckenzie [name] is a 78 y.o. female presenting on 04/13/2024 for Foot Pain (Left heel pain x 2-3 months.  States it has a burning feeling )   Mckenzie [name] is a 78 year old female with well-controlled diabetes who presents with left heel and foot pain.  Left heel and foot pain - Pain present for the past 2-3 nights - Pain described as feeling like a bruise, especially when walking - Pain primarily occurs at night and affects sleep - Pain located on the left heel, sometimes radiating to the rest of the foot - Applying water to the area provides relief for a couple of hours, but pain recurs  Sleep disturbance - Nocturnal pain in the left heel and foot disrupts sleep  Diabetes mellitus - Diabetes is well-controlled, as indicated by usual A1c levels - No current or prior symptoms of neuropathy  Medication history and concerns - Takes a cholesterol medication similar to Lipitor daily - Previous use of prednisone  resulted in bone weakening and fractures - Concerned about using prednisone  again due to prior adverse effects          Relevant past medical, surgical, family, and social history reviewed and updated as indicated.  Allergies and medications reviewed and updated. Data reviewed: Chart in Epic.   Past Medical History:  Diagnosis Date   Anemia    low iron   Arthritis    Depression    Diabetes (HCC) 11/30/2021   High cholesterol    Hypertension    Retinal micro-aneurysm of right eye    Stroke (HCC)    2 or 3 mini strokes     Past Surgical History:  Procedure Laterality Date   ARTERY BIOPSY Bilateral 12/15/2020   Procedure: BILATERAL TEMPORAL ARTERY BIOPSIES;  Surgeon: Eliza Lonni RAMAN, MD;  Location: [facility] OR;  Service: Vascular;  Laterality: Bilateral;   COLONOSCOPY WITH PROPOFOL  N/A 12/09/2020   Procedure: COLONOSCOPY WITH PROPOFOL ;  Surgeon: Avram Lupita BRAVO, MD;  Location: [facility] ENDOSCOPY;  Service: Endoscopy;  Laterality: N/A;   ESOPHAGOGASTRODUODENOSCOPY (EGD) WITH PROPOFOL  N/A 12/09/2020   Procedure: ESOPHAGOGASTRODUODENOSCOPY (EGD) WITH PROPOFOL ;  Surgeon: Avram Lupita BRAVO, MD;  Location: [facility] ENDOSCOPY;  Service: Endoscopy;  Laterality: N/A;   EYE SURGERY     FINGER SURGERY Left    RING FINGER   IR THORACENTESIS ASP PLEURAL SPACE W/IMG GUIDE  12/13/2020   OPEN REDUCTION INTERNAL FIXATION (ORIF) DISTAL RADIAL FRACTURE Left 10/05/2020   Procedure: OPEN REDUCTION INTERNAL FIXATION (ORIF) DISTAL RADIAL FRACTURE;  Surgeon: Kendal Franky SQUIBB, MD;  Location: [facility] OR;  Service: Orthopedics;  Laterality: Left;  supraclavicular block   POLYPECTOMY  12/09/2020   Procedure: POLYPECTOMY;  Surgeon: Avram Lupita BRAVO, MD;  Location: [facility] ENDOSCOPY;  Service: Endoscopy;;   SPINAL FUSION  08/10/2021   T10-L2   TUBAL LIGATION      Social History   Socioeconomic History   Marital status: Married    Spouse name: Not on file   Number of children: 3   Years of education: 67  Highest education level: Not on file  Occupational History   Not on file  Tobacco Use   Smoking status: Former    Current packs/day: 0.00    Average packs/day: 0.1 packs/day for 1 year (0.1 ttl pk-yrs)    Types: Cigarettes    Start date: 34    Quit date: 43    Years since quitting: 43.5   Smokeless tobacco: Never   Tobacco comments:    for a short period  Vaping Use   Vaping status: Never Used  Substance and Sexual Activity   Alcohol  use: No   Drug use: No   Sexual activity: Not on file  Other Topics Concern   Not on file  Social  History Narrative   Right handed   Caffeine use: coffee daily   Social Drivers of Corporate investment banker Strain: Not on file  Food Insecurity: No Food Insecurity (04/12/2023)   Hunger Vital Sign    Worried About Running Out of Food in the Last Year: Never true    Ran Out of Food in the Last Year: Never true  Transportation Needs: No Transportation Needs (04/12/2023)   PRAPARE - Administrator, Civil Service (Medical): No    Lack of Transportation (Non-Medical): No  Physical Activity: Not on file  Stress: Not on file  Social Connections: Not on file  Intimate Partner Violence: Not At Risk (04/12/2023)   Humiliation, Afraid, Rape, and Kick questionnaire    Fear of Current or Ex-Partner: No    Emotionally Abused: No    Physically Abused: No    Sexually Abused: No    Outpatient Encounter Medications as of 04/13/2024  Medication Sig   albuterol  (VENTOLIN  HFA) 108 (90 Base) MCG/ACT inhaler Inhale 2 puffs into the lungs every 6 (six) hours as needed for wheezing or shortness of breath.   amLODipine  (NORVASC ) 10 MG tablet Take 1 tablet (10 mg total) by mouth at bedtime.   aspirin  EC 81 MG tablet Take 81 mg by mouth daily. Swallow whole.   atorvastatin  (LIPITOR) 20 MG tablet Take 1 tablet (20 mg total) by mouth daily.   Calcium  Carb-Cholecalciferol  (CALCIUM +D3 PO) Take 2 tablets by mouth in the morning.   COMBIGAN  0.2-0.5 % ophthalmic solution Place 1 drop into both eyes every 12 (twelve) hours.   dorzolamide  (TRUSOPT ) 2 % ophthalmic solution Place 1 drop into the left eye 2 (two) times daily.   Ergocalciferol  (VITAMIN D2) 50 MCG (2000 UT) TABS Take 1 tablet by mouth daily.   escitalopram  (LEXAPRO ) 10 MG tablet Take 1 tablet (10 mg total) by mouth daily after breakfast.   hydrochlorothiazide  (HYDRODIURIL ) 12.5 MG tablet Take 1 tablet (12.5 mg total) by mouth daily.   ketorolac  (ACULAR ) 0.4 % SOLN Place 1 drop into the right eye 3 (three) times daily.   latanoprost  (XALATAN )  0.005 % ophthalmic solution Place 1 drop into both eyes at bedtime.   lisinopril  (ZESTRIL ) 10 MG tablet Take 1 tablet (10 mg total) by mouth daily.   magnesium  oxide (MAG-OX) 400 MG tablet Take 400 mg by mouth in the morning.   Multiple Vitamin (MULTIVITAMIN WITH MINERALS) TABS tablet Take 1 tablet by mouth in the morning. One A Day Multivitamin   naproxen  (NAPROSYN ) 500 MG tablet Take 1 tablet (500 mg total) by mouth 2 (two) times daily with a meal for 14 days.   risperiDONE  (RISPERDAL ) 0.25 MG tablet Take 1 tablet (0.25 mg total) by mouth 2 (two) times daily.  No facility-administered encounter medications on file as of 04/13/2024.    Allergies  Allergen Reactions   Aldomet [Methyldopa] Other (See Comments)    Flu-like symptoms   Fosamax [Alendronate Sodium] Other (See Comments)    Weakness/fatigue    Plavix  [Clopidogrel ] Other (See Comments)    Arm swelled, bleeding   Acetazolamide Rash   Buspar  [Buspirone ] Palpitations    Pertinent ROS per HPI, otherwise unremarkable      Objective:  BP (!) 157/78   Pulse 77   Temp (!) 96.3 F (35.7 C)   SpO2 92%    Wt Readings from Last 3 Encounters:  04/18/23 98 lb (44.5 kg)  04/12/23 99 lb (44.9 kg)  09/10/22 111 lb (50.3 kg)    Physical Exam Vitals and nursing note reviewed.  Constitutional:      Appearance: Normal appearance. She is ill-appearing (chronically ill).  HENT:     Head: Normocephalic and atraumatic.     Nose: Nose normal.     Mouth/Throat:     Mouth: Mucous membranes are moist.  Eyes:     Conjunctiva/sclera: Conjunctivae normal.     Pupils: Pupils are equal, round, and reactive to light.  Cardiovascular:     Rate and Rhythm: Normal rate and regular rhythm.     Pulses: Normal pulses.     Heart sounds: Normal heart sounds.  Pulmonary:     Effort: Pulmonary effort is normal.     Breath sounds: Normal breath sounds.  Musculoskeletal:       Feet:     Comments: Kyphosis  Skin:    General: Skin is warm and  dry.     Capillary Refill: Capillary refill takes less than 2 seconds.  Neurological:     Mental Status: She is alert. Mental status is at baseline.     Cranial Nerves: No cranial nerve deficit.     Motor: No weakness.     Gait: Gait abnormal.  Psychiatric:        Mood and Affect: Mood normal.        Behavior: Behavior normal.    X-Ray: left foot: heel spur noted. No acute findings. Preliminary x-ray reading by Rosaline Bruns, FNP-C, [facility].   Results for orders placed or performed in visit on 01/22/24  Bayer DCA Hb A1c Waived   Collection Time: 01/22/24 10:41 AM  Result Value Ref Range   HB A1C (BAYER DCA - WAIVED) 5.2 4.8 - 5.6 %  CBC with Differential/Platelet   Collection Time: 01/22/24 10:43 AM  Result Value Ref Range   WBC 7.7 3.4 - 10.8 x10E3/uL   RBC 5.03 3.77 - 5.28 x10E6/uL   Hemoglobin 14.3 11.1 - 15.9 g/dL   Hematocrit 55.1 65.9 - 46.6 %   MCV 89 79 - 97 fL   MCH 28.4 26.6 - 33.0 pg   MCHC 31.9 31.5 - 35.7 g/dL   RDW 86.8 88.2 - 84.5 %   Platelets 329 150 - 450 x10E3/uL   Neutrophils 70 Not Estab. %   Lymphs 24 Not Estab. %   Monocytes 4 Not Estab. %   Eos 2 Not Estab. %   Basos 0 Not Estab. %   Neutrophils Absolute 5.4 1.4 - 7.0 x10E3/uL   Lymphocytes Absolute 1.9 0.7 - 3.1 x10E3/uL   Monocytes Absolute 0.3 0.1 - 0.9 x10E3/uL   EOS (ABSOLUTE) 0.1 0.0 - 0.4 x10E3/uL   Basophils Absolute 0.0 0.0 - 0.2 x10E3/uL   Immature Granulocytes 0 Not Estab. %   Immature Grans (  Abs) 0.0 0.0 - 0.1 x10E3/uL  CMP14+EGFR   Collection Time: 01/22/24 10:43 AM  Result Value Ref Range   Glucose 100 (H) 70 - 99 mg/dL   BUN 16 8 - 27 mg/dL   Creatinine, Ser 9.41 0.57 - 1.00 mg/dL   eGFR 93 >40 fO/fpw/8.26   BUN/Creatinine Ratio 28 12 - 28   Sodium 140 134 - 144 mmol/L   Potassium 5.2 3.5 - 5.2 mmol/L   Chloride 102 96 - 106 mmol/L   CO2 18 (L) 20 - 29 mmol/L   Calcium  10.1 8.7 - 10.3 mg/dL   Total Protein 8.0 6.0 - 8.5 g/dL   Albumin 4.4 3.8 - 4.8 g/dL   Globulin, Total  3.6 1.5 - 4.5 g/dL   Bilirubin Total 0.3 0.0 - 1.2 mg/dL   Alkaline Phosphatase 148 (H) 44 - 121 IU/L   AST 31 0 - 40 IU/L   ALT 14 0 - 32 IU/L  Lipid panel   Collection Time: 01/22/24 10:43 AM  Result Value Ref Range   Cholesterol, Total 299 (H) 100 - 199 mg/dL   Triglycerides 828 (H) 0 - 149 mg/dL   HDL 57 >60 mg/dL   VLDL Cholesterol Cal 32 5 - 40 mg/dL   LDL Chol Calc (NIH) 789 (H) 0 - 99 mg/dL   LDL CALC COMMENT: Comment    Chol/HDL Ratio 5.2 (H) 0.0 - 4.4 ratio  Thyroid  Panel With TSH   Collection Time: 01/22/24 10:43 AM  Result Value Ref Range   TSH 1.120 0.450 - 4.500 uIU/mL   T4, Total 9.3 4.5 - 12.0 ug/dL   T3 Uptake Ratio 24 24 - 39 %   Free Thyroxine Index 2.2 1.2 - 4.9  Vitamin D , 25-hydroxy   Collection Time: 01/22/24 10:43 AM  Result Value Ref Range   Vit D, 25-Hydroxy 64.9 30.0 - 100.0 ng/mL       Pertinent labs & imaging results that were available during my care of the patient were reviewed by me and considered in my medical decision making.  Assessment & Plan:  Mckenzie [name] was seen today for foot pain.  Diagnoses and all orders for this visit:  Pain of left heel -     DG Foot Complete Left -     naproxen  (NAPROSYN ) 500 MG tablet; Take 1 tablet (500 mg total) by mouth 2 (two) times daily with a meal for 14 days.  Calcaneal spur of left foot -     naproxen  (NAPROSYN ) 500 MG tablet; Take 1 tablet (500 mg total) by mouth 2 (two) times daily with a meal for 14 days.      Heel Spur Presents with left heel pain, primarily nocturnal, described as a bruise-like sensation. X-ray confirmed a heel spur. Neuropathy due to diabetes or vitamin B12 deficiency was ruled out as her diabetes is well-controlled and labs are normal. Due to bone weakening from long-term prednisone  use, naproxen  is preferred over steroids for treatment. - Prescribe naproxen  twice daily for two weeks - Advise use of cushioned supportive shoes - Allow use of acetaminophen  for additional pain  relief - Avoid other over-the-counter NSAIDs like ibuprofen  and aspirin  - Consider referral to podiatry for steroid injections if pain persists or worsens  Diabetes Mellitus Diabetes is well-controlled with A1c levels within the target range. No evidence of diabetic neuropathy.  Hyperlipidemia On atorvastatin  daily. Scheduled for follow-up in six months.          Continue all other maintenance medications.  Follow up  plan: Return if symptoms worsen or fail to improve.   Continue healthy lifestyle choices, including diet (rich in fruits, vegetables, and lean proteins, and low in salt and simple carbohydrates) and exercise (at least 30 minutes of moderate physical activity daily).  Educational handout given for heel spur  The above assessment and management plan was discussed with the patient. The patient verbalized understanding of and has agreed to the management plan. Patient is aware to call the clinic if they develop any new symptoms or if symptoms persist or worsen. Patient is aware when to return to the clinic for a follow-up visit. Patient educated on when it is appropriate to go to the emergency department.   Rosaline Bruns, FNP-C [facility] [phone]

## 2024-04-14 ENCOUNTER — Ambulatory Visit: Payer: Self-pay | Admitting: Family Medicine

## 2024-04-19 ENCOUNTER — Encounter: Payer: Self-pay | Admitting: *Deleted

## 2024-04-30 ENCOUNTER — Other Ambulatory Visit: Payer: Self-pay | Admitting: Family Medicine

## 2024-04-30 DIAGNOSIS — M7732 Calcaneal spur, left foot: Secondary | ICD-10-CM

## 2024-04-30 MED ORDER — PREDNISONE 20 MG PO TABS
40.0000 mg | ORAL_TABLET | Freq: Every day | ORAL | 0 refills | Status: AC
Start: 2024-04-30 — End: 2024-05-05

## 2024-05-20 ENCOUNTER — Encounter: Payer: Self-pay | Admitting: Family Medicine

## 2024-05-31 ENCOUNTER — Telehealth: Payer: Self-pay

## 2024-05-31 NOTE — Telephone Encounter (Signed)
 Called and notified patient that disk is ready to pick up. Disk is at the front desk.

## 2024-05-31 NOTE — Telephone Encounter (Signed)
 Fax of report sent. Unable to fax images, noted this on fax

## 2024-05-31 NOTE — Telephone Encounter (Signed)
 Copied from CRM #[id number]. Topic: General - Other >> May 27, 2024  4:25 PM Zebedee SAUNDERS wrote: Reason for CRM: Received call from Dr. Velma Boehringer DPM per Katie ph: [phone], fax: [phone] need imaging and report of DG Foot Complete Left (Accession [id number]) (Order [id number]) fax to Dr. Velma . >> May 27, 2024  4:39 PM Ezella SQUIBB wrote: The report has been faxed. Pt wants to come by the office to pick up imaging on CD on Monday. E2C2 told pt that Mckenzie [name] is off and will return on Monday of next week.

## 2024-05-31 NOTE — Telephone Encounter (Signed)
 Copied from CRM #[id number]. Topic: General - Other >> May 27, 2024  4:25 PM Zebedee SAUNDERS wrote: Reason for CRM: Received call from Dr. Velma Boehringer DPM per Katie ph: [phone], fax: [phone] need imaging and report of DG Foot Complete Left (Accession [id number]) (Order [id number]) fax to Dr. Velma .

## 2024-06-01 ENCOUNTER — Telehealth: Payer: Self-pay | Admitting: Family Medicine

## 2024-06-01 NOTE — Telephone Encounter (Signed)
 Copied from CRM #[id number]. Topic: General - Other >> May 27, 2024  4:25 PM Zebedee SAUNDERS wrote: Reason for CRM: Received call from Dr. Velma Boehringer DPM per Katie ph: [phone], fax: [phone] need imaging and report of DG Foot Complete Left (Accession [id number]) (Order [id number]) fax to Dr. Velma . >> May 31, 2024  2:35 PM Antwanette L wrote: Patient husband Physiological scientist) wife had a missed call from the office about picking up a disk. Marinell wants to know if we can send the disk to Dr. Boehringer?SABRA Marinell is requesting a callback at  [phone]  >> May 27, 2024  4:39 PM Ezella SQUIBB wrote: The report has been faxed. Pt wants to come by the office to pick up imaging on CD on Monday. E2C2 told pt that Melissa is off and will return on Monday of next week.

## 2024-06-01 NOTE — Telephone Encounter (Signed)
 Xray disk sent to Dr. Maylon office - patient's husband aware

## 2024-06-02 ENCOUNTER — Ambulatory Visit: Payer: Self-pay

## 2024-06-02 ENCOUNTER — Encounter: Payer: Self-pay | Admitting: Family Medicine

## 2024-06-02 NOTE — Telephone Encounter (Signed)
 FYI Only or Action Required?: FYI only for provider.  Patient was last seen in primary care on 04/13/2024 by Severa Rock HERO, FNP.  Called Nurse Triage reporting Dysuria.  Symptoms began today.  Interventions attempted: Nothing.  Symptoms are: unchanged.  Triage Disposition: See Physician Within 24 Hours  Patient/caregiver understands and will follow disposition?: Yes     Copied from CRM [id number]. Topic: Clinical - Red Word Triage >> Jun 02, 2024 10:22 AM Mckenzie [name] wrote: [facility] that prompted transfer to Nurse Triage: burning in the bathroom when she pees.. sense of urgency Reason for Disposition  All other patients with painful urination  (Exception: [1] EITHER frequency or urgency AND [2] has on-call doctor.)  Answer Assessment - Initial Assessment Questions 1. SEVERITY: How bad is the pain?  (e.[name]., Scale 1-10; mild, moderate, or severe)     Burning real bad per husband 2. FREQUENCY: How many times have you had painful urination today?      Every 30 minutes 3. PATTERN: Is pain present every time you urinate or just sometimes?      Every time 4. ONSET: When did the painful urination start?      This morning 5. FEVER: Do you have a fever? If Yes, ask: What is your temperature, how was it measured, and when did it start?     unsure 6. PAST UTI: Have you had a urine infection before? If Yes, ask: When was the last time? and What happened that time?      Yes had a bunch of them,  7. CAUSE: What do you think is causing the painful urination?  (e.[name]., UTI, scratch, Herpes sore)     uti 8. OTHER SYMPTOMS: Do you have any other symptoms? (e.[name]., blood in urine, flank pain, genital sores, urgency, vaginal discharge)     Frequency  Protocols used: Urination Pain - Female-A-AH

## 2024-06-02 NOTE — Telephone Encounter (Signed)
 At scheduled for tomorrow

## 2024-06-03 ENCOUNTER — Telehealth: Admitting: Family Medicine

## 2024-06-03 ENCOUNTER — Ambulatory Visit: Admitting: Nurse Practitioner

## 2024-06-03 DIAGNOSIS — N3000 Acute cystitis without hematuria: Secondary | ICD-10-CM

## 2024-06-03 DIAGNOSIS — R300 Dysuria: Secondary | ICD-10-CM

## 2024-06-03 LAB — MICROSCOPIC EXAMINATION
RBC, Urine: NONE SEEN /HPF (ref 0–2)
Renal Epithel, UA: NONE SEEN /HPF
Yeast, UA: NONE SEEN

## 2024-06-03 LAB — URINALYSIS, ROUTINE W REFLEX MICROSCOPIC
Bilirubin, UA: NEGATIVE
Glucose, UA: NEGATIVE
Ketones, UA: NEGATIVE
Nitrite, UA: NEGATIVE
Protein,UA: NEGATIVE
Specific Gravity, UA: 1.01 (ref 1.005–1.030)
Urobilinogen, Ur: 0.2 mg/dL (ref 0.2–1.0)
pH, UA: 7 (ref 5.0–7.5)

## 2024-06-03 MED ORDER — CEPHALEXIN 500 MG PO CAPS
500.0000 mg | ORAL_CAPSULE | Freq: Two times a day (BID) | ORAL | 0 refills | Status: AC
Start: 2024-06-03 — End: 2024-06-10

## 2024-06-03 NOTE — Progress Notes (Signed)
 Patient was scheduled inappropriately by call center. Unable to access a video for a virtual visit. Per husband, Mckenzie [name] has had dysuria for about 1 week with malaise. No other symptoms. Urine was dropped off this morning. Patient is now unable to come to the office for a visit due to mobility issues. UA concerning for UTI. Keflex  ordered with culture pending. No charge for visit.

## 2024-06-08 LAB — URINE CULTURE

## 2024-06-09 ENCOUNTER — Ambulatory Visit: Payer: Self-pay | Admitting: Family Medicine

## 2024-06-09 ENCOUNTER — Encounter: Payer: Self-pay | Admitting: *Deleted

## 2024-07-12 ENCOUNTER — Encounter (INDEPENDENT_AMBULATORY_CARE_PROVIDER_SITE_OTHER): Admitting: Ophthalmology

## 2024-07-12 DIAGNOSIS — I10 Essential (primary) hypertension: Secondary | ICD-10-CM | POA: Diagnosis not present

## 2024-07-12 DIAGNOSIS — H34833 Tributary (branch) retinal vein occlusion, bilateral, with macular edema: Secondary | ICD-10-CM

## 2024-07-12 DIAGNOSIS — H35033 Hypertensive retinopathy, bilateral: Secondary | ICD-10-CM | POA: Diagnosis not present

## 2024-07-23 ENCOUNTER — Ambulatory Visit: Admitting: Family Medicine

## 2024-07-29 ENCOUNTER — Ambulatory Visit: Admitting: Family Medicine

## 2024-07-30 ENCOUNTER — Ambulatory Visit: Admitting: Family Medicine

## 2024-07-30 ENCOUNTER — Ambulatory Visit

## 2024-08-13 ENCOUNTER — Ambulatory Visit: Payer: Self-pay | Admitting: Family Medicine

## 2024-08-13 ENCOUNTER — Encounter: Payer: Self-pay | Admitting: Family Medicine

## 2024-08-13 VITALS — BP 135/68 | HR 59 | Temp 97.4°F | Ht 61.0 in | Wt 112.4 lb

## 2024-08-13 DIAGNOSIS — E1159 Type 2 diabetes mellitus with other circulatory complications: Secondary | ICD-10-CM | POA: Diagnosis not present

## 2024-08-13 DIAGNOSIS — E1165 Type 2 diabetes mellitus with hyperglycemia: Secondary | ICD-10-CM

## 2024-08-13 DIAGNOSIS — M316 Other giant cell arteritis: Secondary | ICD-10-CM

## 2024-08-13 DIAGNOSIS — E785 Hyperlipidemia, unspecified: Secondary | ICD-10-CM

## 2024-08-13 DIAGNOSIS — E559 Vitamin D deficiency, unspecified: Secondary | ICD-10-CM | POA: Diagnosis not present

## 2024-08-13 DIAGNOSIS — F321 Major depressive disorder, single episode, moderate: Secondary | ICD-10-CM

## 2024-08-13 DIAGNOSIS — E1169 Type 2 diabetes mellitus with other specified complication: Secondary | ICD-10-CM | POA: Diagnosis not present

## 2024-08-13 DIAGNOSIS — F22 Delusional disorders: Secondary | ICD-10-CM

## 2024-08-13 DIAGNOSIS — I152 Hypertension secondary to endocrine disorders: Secondary | ICD-10-CM

## 2024-08-13 DIAGNOSIS — I5032 Chronic diastolic (congestive) heart failure: Secondary | ICD-10-CM

## 2024-08-13 LAB — BAYER DCA HB A1C WAIVED: HB A1C (BAYER DCA - WAIVED): 5.4 % (ref 4.8–5.6)

## 2024-08-13 MED ORDER — ESCITALOPRAM OXALATE 20 MG PO TABS
20.0000 mg | ORAL_TABLET | Freq: Every day | ORAL | 2 refills | Status: AC
Start: 2024-08-13 — End: ?

## 2024-08-13 MED ORDER — AMLODIPINE BESYLATE 10 MG PO TABS
10.0000 mg | ORAL_TABLET | Freq: Every day | ORAL | 2 refills | Status: AC
Start: 2024-08-13 — End: ?

## 2024-08-13 MED ORDER — LISINOPRIL 10 MG PO TABS
10.0000 mg | ORAL_TABLET | Freq: Every day | ORAL | 2 refills | Status: AC
Start: 2024-08-13 — End: ?

## 2024-08-13 NOTE — Patient Instructions (Signed)

## 2024-08-13 NOTE — Progress Notes (Signed)
 Subjective:  [name], female    DOB: [birth year], 78 y.o.   MRN: [id number]  Patient Care Team: Severa Rock HERO, FNP as PCP - General (Family Medicine) Cindie Carlin POUR, DO as Consulting Physician (Internal Medicine) Amadeo Windell SAILOR, MD (Inactive) as Consulting Physician (Oncology) [facility]   Chief Complaint:  Medical Management of Chronic Issues   HPI: Mckenzie [name] is a 78 y.o. female presenting on 08/13/2024 for Medical Management of Chronic Issues   Mckenzie [name] is a 78 year old female with depression and hypertension who presents for medication management and follow-up.  Depressive symptoms - Persistent depressive symptoms despite medication adjustments - Lexapro  dose was reduced from 20 mg to 10 mg by a previous provider, then increased back to 20 mg approximately six to seven weeks ago - Partial relief with current Lexapro  20 mg regimen - Ongoing difficulty with motivation and activity levels  Hypertension management - Blood pressure well-controlled with current regimen - Medications include amlodipine , lisinopril , and hydrochlorothiazide  12.5 mg - No reported side effects or complications from antihypertensive therapy  Diabetes mellitus - Diabetes managed through dietary modifications - A1c levels remain stable - No home blood glucose monitoring - History of elevated blood sugars while taking prednisone   Hearing - No changes in hearing        Relevant past medical, surgical, family, and social history reviewed and updated as indicated.  Allergies and medications reviewed and updated. Data reviewed: Chart in Epic.   Past Medical History:  Diagnosis Date   Anemia    low iron   Arthritis    Depression    Diabetes (HCC) 11/30/2021   High cholesterol    Hypertension    Retinal micro-aneurysm of right eye    Stroke (HCC)    2 or 3 mini strokes    Past Surgical History:  Procedure Laterality Date    ARTERY BIOPSY Bilateral 12/15/2020   Procedure: BILATERAL TEMPORAL ARTERY BIOPSIES;  Surgeon: Eliza Lonni RAMAN, MD;  Location: [facility] OR;  Service: Vascular;  Laterality: Bilateral;   COLONOSCOPY WITH PROPOFOL  N/A 12/09/2020   Procedure: COLONOSCOPY WITH PROPOFOL ;  Surgeon: Avram Lupita BRAVO, MD;  Location: [facility] ENDOSCOPY;  Service: Endoscopy;  Laterality: N/A;   ESOPHAGOGASTRODUODENOSCOPY (EGD) WITH PROPOFOL  N/A 12/09/2020   Procedure: ESOPHAGOGASTRODUODENOSCOPY (EGD) WITH PROPOFOL ;  Surgeon: Avram Lupita BRAVO, MD;  Location: [facility] ENDOSCOPY;  Service: Endoscopy;  Laterality: N/A;   EYE SURGERY     FINGER SURGERY Left    RING FINGER   IR THORACENTESIS ASP PLEURAL SPACE W/IMG GUIDE  12/13/2020   OPEN REDUCTION INTERNAL FIXATION (ORIF) DISTAL RADIAL FRACTURE Left 10/05/2020   Procedure: OPEN REDUCTION INTERNAL FIXATION (ORIF) DISTAL RADIAL FRACTURE;  Surgeon: Kendal Franky SQUIBB, MD;  Location: [facility] OR;  Service: Orthopedics;  Laterality: Left;  supraclavicular block   POLYPECTOMY  12/09/2020   Procedure: POLYPECTOMY;  Surgeon: Avram Lupita BRAVO, MD;  Location: [facility] ENDOSCOPY;  Service: Endoscopy;;   SPINAL FUSION  08/10/2021   T10-L2   TUBAL LIGATION      Social History   Socioeconomic History   Marital status: Married    Spouse name: Not on file   Number of children: 3   Years of education: 12   Highest education level: Not on file  Occupational History   Not on file  Tobacco Use   Smoking status: Former    Current packs/day: 0.00    Average packs/day: 0.1 packs/day for 1 year (0.1  ttl pk-yrs)    Types: Cigarettes    Start date: 78    Quit date: 53    Years since quitting: 43.8   Smokeless tobacco: Never   Tobacco comments:    for a short period  Vaping Use   Vaping status: Never Used  Substance and Sexual Activity   Alcohol  use: No   Drug use: No   Sexual activity: Not on file  Other Topics Concern   Not on file  Social History Narrative   Right handed   Caffeine use: coffee  daily   Social Drivers of Corporate Investment Banker Strain: Not on file  Food Insecurity: No Food Insecurity (04/12/2023)   Hunger Vital Sign    Worried About Running Out of Food in the Last Year: Never true    Ran Out of Food in the Last Year: Never true  Transportation Needs: No Transportation Needs (04/12/2023)   PRAPARE - Administrator, Civil Service (Medical): No    Lack of Transportation (Non-Medical): No  Physical Activity: Not on file  Stress: Not on file  Social Connections: Not on file  Intimate Partner Violence: Not At Risk (04/12/2023)   Humiliation, Afraid, Rape, and Kick questionnaire    Fear of Current or Ex-Partner: No    Emotionally Abused: No    Physically Abused: No    Sexually Abused: No    Outpatient Encounter Medications as of 08/13/2024  Medication Sig   albuterol  (VENTOLIN  HFA) 108 (90 Base) MCG/ACT inhaler Inhale 2 puffs into the lungs every 6 (six) hours as needed for wheezing or shortness of breath.   aspirin  EC 81 MG tablet Take 81 mg by mouth daily. Swallow whole.   atorvastatin  (LIPITOR) 20 MG tablet Take 1 tablet (20 mg total) by mouth daily.   Calcium  Carb-Cholecalciferol  (CALCIUM +D3 PO) Take 2 tablets by mouth in the morning.   COMBIGAN  0.2-0.5 % ophthalmic solution Place 1 drop into both eyes every 12 (twelve) hours.   dorzolamide  (TRUSOPT ) 2 % ophthalmic solution Place 1 drop into the left eye 2 (two) times daily.   Ergocalciferol  (VITAMIN D2) 50 MCG (2000 UT) TABS Take 1 tablet by mouth daily.   escitalopram  (LEXAPRO ) 20 MG tablet Take 1 tablet (20 mg total) by mouth daily.   hydrochlorothiazide  (HYDRODIURIL ) 12.5 MG tablet Take 1 tablet (12.5 mg total) by mouth daily.   ketorolac  (ACULAR ) 0.4 % SOLN Place 1 drop into the right eye 3 (three) times daily.   latanoprost  (XALATAN ) 0.005 % ophthalmic solution Place 1 drop into both eyes at bedtime.   magnesium  oxide (MAG-OX) 400 MG tablet Take 400 mg by mouth in the morning.   Multiple  Vitamin (MULTIVITAMIN WITH MINERALS) TABS tablet Take 1 tablet by mouth in the morning. One A Day Multivitamin   [DISCONTINUED] escitalopram  (LEXAPRO ) 10 MG tablet Take 1 tablet (10 mg total) by mouth daily after breakfast. (Patient taking differently: Take 20 mg by mouth daily after breakfast.)   amLODipine  (NORVASC ) 10 MG tablet Take 1 tablet (10 mg total) by mouth at bedtime.   lisinopril  (ZESTRIL ) 10 MG tablet Take 1 tablet (10 mg total) by mouth daily.   [DISCONTINUED] amLODipine  (NORVASC ) 10 MG tablet Take 1 tablet (10 mg total) by mouth at bedtime.   [DISCONTINUED] lisinopril  (ZESTRIL ) 10 MG tablet Take 1 tablet (10 mg total) by mouth daily.   [DISCONTINUED] risperiDONE  (RISPERDAL ) 0.25 MG tablet Take 1 tablet (0.25 mg total) by mouth 2 (two) times daily.  No facility-administered encounter medications on file as of 08/13/2024.    Allergies  Allergen Reactions   Aldomet [Methyldopa] Other (See Comments)    Flu-like symptoms   Fosamax [Alendronate Sodium] Other (See Comments)    Weakness/fatigue    Plavix  [Clopidogrel ] Other (See Comments)    Arm swelled, bleeding   Acetazolamide Rash   Buspar  [Buspirone ] Palpitations    Pertinent ROS per HPI, otherwise unremarkable      Objective:  BP 135/68   Pulse (!) 59   Temp (!) 97.4 F (36.3 C)   Ht 5' 1 (1.549 m)   Wt 112 lb 6.4 oz (51 kg)   SpO2 99%   BMI 21.24 kg/m    Wt Readings from Last 3 Encounters:  08/13/24 112 lb 6.4 oz (51 kg)  04/18/23 98 lb (44.5 kg)  04/12/23 99 lb (44.9 kg)    Physical Exam Vitals and nursing note reviewed.  Constitutional:      General: She is not in acute distress.    Appearance: She is ill-appearing (chronically ill). She is not toxic-appearing or diaphoretic.  HENT:     Head: Normocephalic and atraumatic.     Nose: Nose normal.     Mouth/Throat:     Mouth: Mucous membranes are moist.  Eyes:     Pupils: Pupils are equal, round, and reactive to light.  Cardiovascular:     Rate  and Rhythm: Normal rate and regular rhythm.     Heart sounds: Normal heart sounds.  Pulmonary:     Effort: Pulmonary effort is normal.     Breath sounds: Normal breath sounds.  Musculoskeletal:     Cervical back: Neck supple.     Right lower leg: No edema.     Left lower leg: No edema.     Comments: Kyphosis   Skin:    General: Skin is warm and dry.     Capillary Refill: Capillary refill takes less than 2 seconds.  Neurological:     Mental Status: She is alert. Mental status is at baseline.     Gait: Gait abnormal (in wheelchair).       Results for orders placed or performed in visit on 06/03/24  Microscopic Examination   Collection Time: 06/03/24  9:09 AM   Urine  Result Value Ref Range   WBC, UA 11-30 (A) 0 - 5 /hpf   RBC, Urine None seen 0 - 2 /hpf   Epithelial Cells (non renal) 0-10 0 - 10 /hpf   Renal Epithel, UA None seen None seen /hpf   Bacteria, UA Many (A) None seen/Few   Yeast, UA None seen None seen  Urinalysis, Routine w reflex microscopic   Collection Time: 06/03/24  9:09 AM  Result Value Ref Range   Specific Gravity, UA 1.010 1.005 - 1.030   pH, UA 7.0 5.0 - 7.5   Color, UA Yellow Yellow   Appearance Ur Clear Clear   Leukocytes,UA 1+ (A) Negative   Protein,UA Negative Negative/Trace   Glucose, UA Negative Negative   Ketones, UA Negative Negative   RBC, UA 1+ (A) Negative   Bilirubin, UA Negative Negative   Urobilinogen, Ur 0.2 0.2 - 1.0 mg/dL   Nitrite, UA Negative Negative   Microscopic Examination See below:   Urine Culture   Collection Time: 06/03/24  4:07 PM   Specimen: Urine   UR  Result Value Ref Range   Urine Culture, Routine Final report (A)    Organism ID, Bacteria Klebsiella pneumoniae (A)  Antimicrobial Susceptibility Comment        Pertinent labs & imaging results that were available during my care of the patient were reviewed by me and considered in my medical decision making.  Assessment & Plan:  Mckenzie [name] was seen today for  medical management of chronic issues.  Diagnoses and all orders for this visit:  Hyperlipidemia associated with type 2 diabetes mellitus (HCC) -     Lipid panel -     Bayer DCA Hb A1c Waived -     CMP14+EGFR  Hypertension associated with diabetes (HCC) -     Lipid panel -     Bayer DCA Hb A1c Waived -     CMP14+EGFR -     lisinopril  (ZESTRIL ) 10 MG tablet; Take 1 tablet (10 mg total) by mouth daily. -     amLODipine  (NORVASC ) 10 MG tablet; Take 1 tablet (10 mg total) by mouth at bedtime. -     Microalbumin / creatinine urine ratio  Type 2 diabetes mellitus with hyperglycemia, without long-term current use of insulin  (HCC) -     Lipid panel -     Bayer DCA Hb A1c Waived -     CMP14+EGFR -     lisinopril  (ZESTRIL ) 10 MG tablet; Take 1 tablet (10 mg total) by mouth daily. -     Microalbumin / creatinine urine ratio  Vitamin D  deficiency -     CMP14+EGFR -     VITAMIN D  25 Hydroxy (Vit-D Deficiency, Fractures)  Depression, major, single episode, moderate (HCC) -     CMP14+EGFR -     escitalopram  (LEXAPRO ) 20 MG tablet; Take 1 tablet (20 mg total) by mouth daily.  Paranoia (HCC) -     CMP14+EGFR  Chronic diastolic (congestive) heart failure (HCC) -     Lipid panel -     CMP14+EGFR      Type 2 diabetes mellitus, diet controlled Diabetes is currently diet controlled with no recent medication use. Previous A1c levels have been good. No home blood sugar monitoring is being done as it was deemed unnecessary. - Ordered A1c test to assess current diabetes control - Plan for follow-up every six months to monitor diabetes status  Depression Managed with Lexapro . Previously on 20 mg, reduced to 10 mg by previous provider, but she has been taking 20 mg for 6-7 weeks with some improvement. Lexapro  can cause hyponatremia, so sodium levels need to be monitored. - Prescribed Lexapro  20 mg tablet to simplify dosing - Monitor sodium levels to prevent hyponatremia - Plan for follow-up  every six months to monitor depression and sodium levels  General Health Maintenance Blood pressure is well controlled with amlodipine , lisinopril , and hydrochlorothiazide . Cholesterol is managed with atorvastatin . No recent cardiology follow-up as previous cardiologist deemed it unnecessary. - Continue current medications for blood pressure and cholesterol management          Continue all other maintenance medications.  Follow up plan: Return in 6 months (on 02/10/2025), or if symptoms worsen or fail to improve, for Annual Physical.   Continue healthy lifestyle choices, including diet (rich in fruits, vegetables, and lean proteins, and low in salt and simple carbohydrates) and exercise (at least 30 minutes of moderate physical activity daily).  Educational handout given for DM  The above assessment and management plan was discussed with the patient. The patient verbalized understanding of and has agreed to the management plan. Patient is aware to call the clinic if they develop any new symptoms or if  symptoms persist or worsen. Patient is aware when to return to the clinic for a follow-up visit. Patient educated on when it is appropriate to go to the emergency department.   Rosaline Bruns, FNP-C [facility] [phone]

## 2024-08-14 LAB — CMP14+EGFR
ALT: 16 IU/L (ref 0–32)
AST: 20 IU/L (ref 0–40)
Albumin: 4.2 g/dL (ref 3.8–4.8)
Alkaline Phosphatase: 118 IU/L (ref 49–135)
BUN/Creatinine Ratio: 19 (ref 12–28)
BUN: 11 mg/dL (ref 8–27)
Bilirubin Total: 0.3 mg/dL (ref 0.0–1.2)
CO2: 25 mmol/L (ref 20–29)
Calcium: 9.7 mg/dL (ref 8.7–10.3)
Chloride: 97 mmol/L (ref 96–106)
Creatinine, Ser: 0.58 mg/dL (ref 0.57–1.00)
Globulin, Total: 3.3 g/dL (ref 1.5–4.5)
Glucose: 90 mg/dL (ref 70–99)
Potassium: 3.9 mmol/L (ref 3.5–5.2)
Sodium: 138 mmol/L (ref 134–144)
Total Protein: 7.5 g/dL (ref 6.0–8.5)
eGFR: 92 mL/min/1.73 (ref >59)

## 2024-08-14 LAB — LIPID PANEL
Chol/HDL Ratio: 2.9 ratio (ref 0.0–4.4)
Cholesterol, Total: 144 mg/dL (ref 100–199)
HDL: 49 mg/dL (ref >39)
LDL Chol Calc (NIH): 71 mg/dL (ref 0–99)
Triglycerides: 140 mg/dL (ref 0–149)
VLDL Cholesterol Cal: 24 mg/dL (ref 5–40)

## 2024-08-14 LAB — VITAMIN D 25 HYDROXY (VIT D DEFICIENCY, FRACTURES): Vit D, 25-Hydroxy: 58.8 ng/mL (ref 30.0–100.0)

## 2024-08-15 LAB — MICROALBUMIN / CREATININE URINE RATIO
Creatinine, Urine: 52.4 mg/dL
Microalb/Creat Ratio: 23 mg/g{creat} (ref 0–29)
Microalbumin, Urine: 12.3 ug/mL

## 2024-08-24 ENCOUNTER — Ambulatory Visit: Admitting: Family

## 2024-08-24 ENCOUNTER — Encounter: Payer: Self-pay | Admitting: Family

## 2024-08-24 ENCOUNTER — Ambulatory Visit: Payer: Self-pay | Admitting: *Deleted

## 2024-08-24 VITALS — BP 143/62 | HR 68 | Temp 97.4°F | Ht 61.0 in

## 2024-08-24 DIAGNOSIS — S80922A Unspecified superficial injury of left lower leg, initial encounter: Secondary | ICD-10-CM

## 2024-08-24 DIAGNOSIS — T148XXA Other injury of unspecified body region, initial encounter: Secondary | ICD-10-CM

## 2024-08-24 NOTE — Telephone Encounter (Signed)
 FYI Only or Action Required?: FYI only for provider: appointment scheduled on 11/18.  Patient was last seen in primary care on 08/13/2024 by Severa Rock HERO, FNP.  Called Nurse Triage reporting Leg Injury.  Symptoms began several days ago.  Interventions attempted: Rest, hydration, or home remedies.  Symptoms are: gradually worsening.  Triage Disposition: See Physician Within 24 Hours  Patient/caregiver understands and will follow disposition?: Yes  Copied from CRM #[id number]. Topic: Clinical - Red Word Triage >> Aug 24, 2024  9:21 AM Merlynn LABOR wrote: Red Word that prompted transfer to Nurse Triage: Knot on left leg, swelling with bubble and tenderness Reason for Disposition  [1] MODERATE pain (e.g., interferes with normal activities, limping) AND [2] high-risk adult (e.g., age > 60 years, osteoporosis, chronic steroid use)  Answer Assessment - Initial Assessment Questions 1. MECHANISM: How did the injury happen? (e.g., twisting injury, direct blow)      Unsure- seems to be hematoma under the shin- knot present with bubble on top of skin 2. ONSET: When did the injury happen? (e.g., minutes, hours ago)      Noticed Friday afternoon 3. LOCATION: Where is the injury located?      Knot on shin- left leg 4. APPEARANCE of INJURY: What does the injury look like?  (e.g., deformity of leg)     Rapid changes  5. SEVERITY: Can you put weight on that leg? Can you walk?      Yes- but sore 6. SIZE: For cuts, bruises, or swelling, ask: How large is it? (e.g., inches or centimeters)      Size of half dollar 7. PAIN: Is there pain? If Yes, ask: How bad is the pain?   What does it keep you from doing? (Scale 0-10; or none, mild, moderate, severe)     Sore to touch , aches when not touching  9. OTHER SYMPTOMS: Do you have any other symptoms?      none  Protocols used: Leg Injury-A-AH

## 2024-08-24 NOTE — Progress Notes (Signed)
 Subjective:    [name], female    DOB: 29-Aug-1946, 78 y.o.   MRN: [id number]  Chief Complaint  Patient presents with   spot on left leg     Left leg     HPI PT presents to the office today with a hematoma on left leg that they noticed Friday. This noticed that is worsen. Denies any injury that she is aware. She takes aspirin  81 mg daily.  Denies any warmth, purulent discharge, or fevers.   Review of Systems  All other systems reviewed and are negative.   Social History   Socioeconomic History   Marital status: Married    Spouse name: Not on file   Number of children: 3   Years of education: 12   Highest education level: Not on file  Occupational History   Not on file  Tobacco Use   Smoking status: Former    Current packs/day: 0.00    Average packs/day: 0.1 packs/day for 1 year (0.1 ttl pk-yrs)    Types: Cigarettes    Start date: 41    Quit date: 15    Years since quitting: 43.9   Smokeless tobacco: Never   Tobacco comments:    for a short period  Vaping Use   Vaping status: Never Used  Substance and Sexual Activity   Alcohol  use: No   Drug use: No   Sexual activity: Not on file  Other Topics Concern   Not on file  Social History Narrative   Right handed   Caffeine use: coffee daily   Social Drivers of Corporate Investment Banker Strain: Not on file  Food Insecurity: No Food Insecurity (04/12/2023)   Hunger Vital Sign    Worried About Running Out of Food in the Last Year: Never true    Ran Out of Food in the Last Year: Never true  Transportation Needs: No Transportation Needs (04/12/2023)   PRAPARE - Administrator, Civil Service (Medical): No    Lack of Transportation (Non-Medical): No  Physical Activity: Not on file  Stress: Not on file  Social Connections: Not on file   Family History  Problem Relation Age of Onset   Hyperlipidemia Mother    Hypertension Mother    Diabetes Mother    COPD Father    Depression  Sister    Suicidality Brother    Drug abuse Brother    Healthy Daughter    Healthy Daughter    Healthy Daughter    Colon cancer Neg Hx    Celiac disease Neg Hx    Inflammatory bowel disease Neg Hx         Objective:   Physical Exam Vitals reviewed.  Constitutional:      General: She is not in acute distress.    Appearance: She is well-developed.  HENT:     Head: Normocephalic and atraumatic.  Eyes:     Pupils: Pupils are equal, round, and reactive to light.  Neck:     Thyroid : No thyromegaly.  Cardiovascular:     Rate and Rhythm: Normal rate and regular rhythm.     Heart sounds: Normal heart sounds. No murmur heard. Pulmonary:     Effort: Pulmonary effort is normal. No respiratory distress.     Breath sounds: Normal breath sounds. No wheezing.  Abdominal:     General: Bowel sounds are normal. There is no distension.     Palpations: Abdomen is soft.  Tenderness: There is no abdominal tenderness.  Musculoskeletal:        General: Tenderness present.     Cervical back: Normal range of motion and neck supple.  Skin:    General: Skin is warm and dry.     Findings: Bruising present.         Comments: Approx 2.5X2.5 hematoma on left shin  Neurological:     Mental Status: She is alert and oriented to person, place, and time.     Cranial Nerves: No cranial nerve deficit.     Motor: Weakness present.     Deep Tendon Reflexes: Reflexes are normal and symmetric.  Psychiatric:        Behavior: Behavior normal.        Thought Content: Thought content normal.        Judgment: Judgment normal.       BP (!) 143/62   Pulse 68   Temp (!) 97.4 F (36.3 C) (Temporal)   Ht 5' 1 (1.549 m)   BMI 21.24 kg/m      Assessment & Plan:  [name] comes in today with chief complaint of spot on left leg  (Left leg )   Diagnosis and orders addressed:  1. Hematoma (Primary) Area wrapped Elevate Ice Discussed this could take a few weeks for blood to reabsorb.  Expected for more discoloration of blue and purple around area.  Report any s/s of infection Follow up if symptoms worsen or do not improve       Bari Learn, FNP

## 2024-08-24 NOTE — Patient Instructions (Signed)
Hematoma A hematoma is a collection of blood under the skin, in an organ, in a body space, in a joint space, or in other tissue. The blood can thicken (clot) to form a lump that you can see and feel. The lump is often firm and may become sore and tender. Most hematomas get better in a few days to weeks. However, some hematomas may be serious and require medical care. Hematomas can range from very small to very large. What are the causes? This condition is caused by: A blunt or penetrating injury. Leakage from a blood vessel under the skin. Some medical procedures, including surgeries, such as oral surgery, face lifts, and surgeries on the joints. Some medical conditions that cause bleeding or bruising. There may be multiple hematomas that appear in different areas of the body. What increases the risk? You are more likely to develop this condition if: You are an older adult. You use blood thinners. You regularly use NSAIDs, such as ibuprofen, for pain. You play contact sports. What are the signs or symptoms?  Symptoms of this condition depend on where the hematoma is located.  Common symptoms of a hematoma that is under the skin include: A firm lump on the body. Pain and tenderness in the area. Bruising. Blue, dark blue, purple-red, or yellowish skin (discoloration) may appear at the site of the hematoma if the hematoma is close to the surface of the skin. Common symptoms of a hematoma that is deep in the tissues or body spaces may be less obvious. They include: A collection of blood in the stomach (intra-abdominal hematoma). This may cause pain in the abdomen, weakness, fainting, and shortness of breath. A collection of blood in the head (intracranial hematoma). This may cause a headache or symptoms such as weakness, trouble speaking or understanding, or a change in consciousness. How is this diagnosed? This condition is diagnosed based on: Your medical history. A physical exam. Imaging  tests, such as an ultrasound or CT scan. These may be needed if your health care provider suspects a hematoma in deeper tissues or body spaces. Blood tests. These may be needed if your health care provider believes that the hematoma is caused by a medical condition. How is this treated? Treatment for this condition depends on the cause, size, and location of the hematoma. Treatment may include: Doing nothing. The majority of hematomas do not need treatment as many of them go away on their own. Surgery or close monitoring. This may be needed for large hematomas or hematomas that affect vital organs. Medicines. Medicines may be given if there is an underlying medical cause for the hematoma. Follow these instructions at home: Managing pain, stiffness, and swelling  If directed, put ice on the injured area. To do this: Put ice in a plastic bag. Place a towel between your skin and the bag. Leave the ice on for 20 minutes, 2-3 times a day for the first couple of days. If your skin turns bright red, remove the ice right away to prevent skin damage. The risk of skin damage is higher if you cannot feel pain, heat, or cold. If directed, apply heat to the affected area as often as told by your health care provider. Use the heat source that your health care provider recommends, such as a moist heat pack or a heating pad. Place a towel between your skin and the heat source. Leave the heat on for 20-30 minutes. If your skin turns bright red, remove the heat right  away to prevent burns. The risk of burns is higher if you cannot feel pain, heat, or cold. Raise (elevate) the injured area above the level of your heart while you are sitting or lying down. If directed, wrap the affected area with an elastic bandage. The bandage applies pressure (compression) to the area, which may help to reduce swelling and promote healing. Do not wrap the bandage too tightly around the affected area. If your hematoma is on a leg  or foot (lower extremity) and is painful, your health care provider may recommend crutches. Use them as told by your health care provider. General instructions Take over-the-counter and prescription medicines only as told by your health care provider. Rest the injured area as directed by your health care provider. Keep all follow-up visits. Your health care provider may want to see how your hematoma is progressing with treatment. Contact a health care provider if: You have a fever. The swelling or discoloration gets worse. You develop more hematomas. Your pain is worse or your pain is not controlled with medicine. Your skin over the hematoma breaks or starts bleeding. Get help right away if: Your hematoma is in your chest or abdomen and you have weakness, shortness of breath, or a change in consciousness. You have a hematoma on your scalp that is caused by a fall or injury, and you also have: A headache that gets worse. Trouble speaking or understanding speech. Weakness. A change in alertness or consciousness. These symptoms may be an emergency. Get help right away. Call 911. Do not wait to see if the symptoms will go away. Do not drive yourself to the hospital. This information is not intended to replace advice given to you by your health care provider. Make sure you discuss any questions you have with your health care provider. Document Revised: 03/18/2022 Document Reviewed: 03/18/2022 [employer]vier Patient Education  2024 [employer].

## 2024-09-01 NOTE — Progress Notes (Signed)
 Mckenzie [name]                                          MRN: [id number]   09/01/2024   The [facility] Quality Team Specialist reviewed this patient medical record for the purposes of chart review for care gap closure. The following were reviewed: abstraction for care gap closure-kidney health evaluation for diabetes:eGFR  and uACR.    [facility] Quality Team

## 2025-01-10 ENCOUNTER — Encounter (INDEPENDENT_AMBULATORY_CARE_PROVIDER_SITE_OTHER): Admitting: Ophthalmology

## 2025-02-16 ENCOUNTER — Encounter: Admitting: Family Medicine
